# Patient Record
Sex: Male | Born: 1960 | Race: White | Hispanic: No | Smoking: Current every day smoker
Health system: Southern US, Community
[De-identification: ages and names within clinical notes are randomized; demographics above are authoritative.]

## PROBLEM LIST (undated history)

## (undated) DIAGNOSIS — J439 Emphysema, unspecified: Secondary | ICD-10-CM

## (undated) DIAGNOSIS — I2699 Other pulmonary embolism without acute cor pulmonale: Secondary | ICD-10-CM

## (undated) DIAGNOSIS — Z9889 Other specified postprocedural states: Secondary | ICD-10-CM

## (undated) DIAGNOSIS — I639 Cerebral infarction, unspecified: Secondary | ICD-10-CM

## (undated) DIAGNOSIS — G43909 Migraine, unspecified, not intractable, without status migrainosus: Secondary | ICD-10-CM

## (undated) DIAGNOSIS — F419 Anxiety disorder, unspecified: Secondary | ICD-10-CM

## (undated) DIAGNOSIS — E785 Hyperlipidemia, unspecified: Secondary | ICD-10-CM

## (undated) DIAGNOSIS — Z720 Tobacco use: Secondary | ICD-10-CM

## (undated) DIAGNOSIS — I509 Heart failure, unspecified: Secondary | ICD-10-CM

## (undated) DIAGNOSIS — I10 Essential (primary) hypertension: Secondary | ICD-10-CM

## (undated) DIAGNOSIS — F1910 Other psychoactive substance abuse, uncomplicated: Secondary | ICD-10-CM

## (undated) DIAGNOSIS — F1110 Opioid abuse, uncomplicated: Secondary | ICD-10-CM

## (undated) DIAGNOSIS — C801 Malignant (primary) neoplasm, unspecified: Secondary | ICD-10-CM

## (undated) DIAGNOSIS — J45909 Unspecified asthma, uncomplicated: Secondary | ICD-10-CM

## (undated) DIAGNOSIS — J449 Chronic obstructive pulmonary disease, unspecified: Secondary | ICD-10-CM

## (undated) DIAGNOSIS — T7840XA Allergy, unspecified, initial encounter: Secondary | ICD-10-CM

## (undated) DIAGNOSIS — K5903 Drug induced constipation: Secondary | ICD-10-CM

## (undated) DIAGNOSIS — G5702 Lesion of sciatic nerve, left lower limb: Secondary | ICD-10-CM

## (undated) DIAGNOSIS — F32A Depression, unspecified: Secondary | ICD-10-CM

## (undated) HISTORY — DX: Emphysema, unspecified: J43.9

## (undated) HISTORY — DX: Other psychoactive substance abuse, uncomplicated: F19.10

## (undated) HISTORY — DX: Hyperlipidemia, unspecified: E78.5

## (undated) HISTORY — DX: Other specified postprocedural states: Z98.890

## (undated) HISTORY — DX: Malignant (primary) neoplasm, unspecified: C80.1

## (undated) HISTORY — DX: Essential (primary) hypertension: I10

## (undated) HISTORY — DX: Drug induced constipation: K59.03

## (undated) HISTORY — DX: Chronic obstructive pulmonary disease, unspecified: J44.9

## (undated) HISTORY — DX: Migraine, unspecified, not intractable, without status migrainosus: G43.909

## (undated) HISTORY — DX: Heart failure, unspecified: I50.9

## (undated) HISTORY — DX: Depression, unspecified: F32.A

## (undated) HISTORY — DX: Allergy, unspecified, initial encounter: T78.40XA

## (undated) HISTORY — DX: Anxiety disorder, unspecified: F41.9

## (undated) HISTORY — PX: SPINAL FIXATION SURGERY: SHX1055

## (undated) HISTORY — DX: Lesion of sciatic nerve, left lower limb: G57.02

## (undated) HISTORY — DX: Unspecified asthma, uncomplicated: J45.909

## (undated) HISTORY — PX: SPINE SURGERY: SHX786

## (undated) HISTORY — DX: Cerebral infarction, unspecified: I63.9

---

## 2009-12-02 ENCOUNTER — Emergency Department (HOSPITAL_COMMUNITY): Admission: EM | Admit: 2009-12-02 | Discharge: 2009-12-02 | Payer: Self-pay | Admitting: Emergency Medicine

## 2010-01-29 ENCOUNTER — Encounter: Admission: RE | Admit: 2010-01-29 | Discharge: 2010-01-29 | Payer: Self-pay | Admitting: Family Medicine

## 2010-06-20 ENCOUNTER — Encounter: Admission: RE | Admit: 2010-06-20 | Discharge: 2010-06-20 | Payer: Self-pay | Admitting: Internal Medicine

## 2010-09-06 ENCOUNTER — Encounter: Admission: RE | Admit: 2010-09-06 | Discharge: 2010-09-06 | Payer: Self-pay | Admitting: Internal Medicine

## 2010-10-11 ENCOUNTER — Encounter: Admission: RE | Admit: 2010-10-11 | Discharge: 2010-10-11 | Payer: Self-pay | Admitting: *Deleted

## 2010-11-27 ENCOUNTER — Encounter: Payer: Self-pay | Admitting: Internal Medicine

## 2011-01-24 LAB — POCT I-STAT, CHEM 8
BUN: 11 mg/dL (ref 6–23)
Calcium, Ion: 1.08 mmol/L — ABNORMAL LOW (ref 1.12–1.32)
Creatinine, Ser: 0.7 mg/dL (ref 0.4–1.5)
Hemoglobin: 17.3 g/dL — ABNORMAL HIGH (ref 13.0–17.0)
Sodium: 137 mEq/L (ref 135–145)
TCO2: 21 mmol/L (ref 0–100)

## 2011-11-15 ENCOUNTER — Ambulatory Visit (INDEPENDENT_AMBULATORY_CARE_PROVIDER_SITE_OTHER): Payer: Medicaid Other | Admitting: Family Medicine

## 2011-11-15 ENCOUNTER — Encounter: Payer: Self-pay | Admitting: Family Medicine

## 2011-11-15 DIAGNOSIS — F411 Generalized anxiety disorder: Secondary | ICD-10-CM

## 2011-11-15 DIAGNOSIS — E785 Hyperlipidemia, unspecified: Secondary | ICD-10-CM | POA: Insufficient documentation

## 2011-11-15 DIAGNOSIS — K5900 Constipation, unspecified: Secondary | ICD-10-CM

## 2011-11-15 DIAGNOSIS — M25569 Pain in unspecified knee: Secondary | ICD-10-CM

## 2011-11-15 DIAGNOSIS — J4489 Other specified chronic obstructive pulmonary disease: Secondary | ICD-10-CM

## 2011-11-15 DIAGNOSIS — F419 Anxiety disorder, unspecified: Secondary | ICD-10-CM | POA: Insufficient documentation

## 2011-11-15 DIAGNOSIS — I10 Essential (primary) hypertension: Secondary | ICD-10-CM

## 2011-11-15 DIAGNOSIS — G8929 Other chronic pain: Secondary | ICD-10-CM | POA: Insufficient documentation

## 2011-11-15 DIAGNOSIS — F32A Depression, unspecified: Secondary | ICD-10-CM | POA: Insufficient documentation

## 2011-11-15 DIAGNOSIS — M792 Neuralgia and neuritis, unspecified: Secondary | ICD-10-CM | POA: Insufficient documentation

## 2011-11-15 DIAGNOSIS — G971 Other reaction to spinal and lumbar puncture: Secondary | ICD-10-CM

## 2011-11-15 DIAGNOSIS — F329 Major depressive disorder, single episode, unspecified: Secondary | ICD-10-CM

## 2011-11-15 DIAGNOSIS — J309 Allergic rhinitis, unspecified: Secondary | ICD-10-CM

## 2011-11-15 DIAGNOSIS — J449 Chronic obstructive pulmonary disease, unspecified: Secondary | ICD-10-CM | POA: Insufficient documentation

## 2011-11-15 DIAGNOSIS — Z720 Tobacco use: Secondary | ICD-10-CM | POA: Insufficient documentation

## 2011-11-15 DIAGNOSIS — M539 Dorsopathy, unspecified: Secondary | ICD-10-CM

## 2011-11-15 DIAGNOSIS — J302 Other seasonal allergic rhinitis: Secondary | ICD-10-CM | POA: Insufficient documentation

## 2011-11-15 DIAGNOSIS — M4320 Fusion of spine, site unspecified: Secondary | ICD-10-CM | POA: Insufficient documentation

## 2011-11-15 DIAGNOSIS — F17200 Nicotine dependence, unspecified, uncomplicated: Secondary | ICD-10-CM

## 2011-11-15 DIAGNOSIS — F3289 Other specified depressive episodes: Secondary | ICD-10-CM

## 2011-11-15 MED ORDER — ESCITALOPRAM OXALATE 20 MG PO TABS: 20.0000 mg | ORAL_TABLET | Freq: Every day | ORAL | Status: DC

## 2011-11-15 MED ORDER — TRAZODONE HCL 100 MG PO TABS: 100.0000 mg | ORAL_TABLET | Freq: Every day | ORAL | Status: DC

## 2011-11-15 MED ORDER — AMLODIPINE-OLMESARTAN 10-40 MG PO TABS: 1.0000 | ORAL_TABLET | Freq: Every day | ORAL | Status: DC

## 2011-11-15 MED ORDER — ALBUTEROL SULFATE HFA 108 (90 BASE) MCG/ACT IN AERS: 2.0000 | INHALATION_SPRAY | RESPIRATORY_TRACT | Status: DC | PRN

## 2011-11-15 MED ORDER — LORAZEPAM 2 MG PO TABS: 2.0000 mg | ORAL_TABLET | Freq: Two times a day (BID) | ORAL | Status: DC

## 2011-11-15 MED ORDER — POLYETHYLENE GLYCOL 3350 17 G PO PACK: 17.0000 g | PACK | Freq: Every day | ORAL | Status: DC | PRN

## 2011-11-15 MED ORDER — NICOTINE 10 MG IN INHA: 1.0000 | Freq: Four times a day (QID) | RESPIRATORY_TRACT | Status: DC | PRN

## 2011-11-15 MED ORDER — FLUTICASONE PROPIONATE 50 MCG/ACT NA SUSP: 2.0000 | Freq: Two times a day (BID) | NASAL | Status: DC | PRN

## 2011-11-15 MED ORDER — BUTALBITAL-APAP-CAFFEINE 50-750-40 MG PO TABS: 1.0000 | ORAL_TABLET | Freq: Two times a day (BID) | ORAL | Status: DC

## 2011-11-15 MED ORDER — LORATADINE 10 MG PO TABS: 10.0000 mg | ORAL_TABLET | Freq: Every day | ORAL | Status: DC

## 2011-11-15 MED ORDER — DICLOFENAC SODIUM 75 MG PO TBEC: 75.0000 mg | DELAYED_RELEASE_TABLET | Freq: Two times a day (BID) | ORAL | Status: DC

## 2011-11-15 NOTE — Assessment & Plan Note (Signed)
Constipation likely due to pt on daily Opana regimen. Having daily formed BM. Will Dc Amitiza at this time. Will continue w/ Miralax Q Deveny PRN.

## 2011-11-15 NOTE — Patient Instructions (Addendum)
Thank you for coming into clinic today. It  was nice meeting you. As discussed I would like for you to come back in 1 week so that we can recheck your blood pressure and remove the spots on your leg and clean out the spot on your right waist. Im  also very glad that you are interested in stopping smoking. Please look into your Medicaid coverage with regards to meeting with our psychologist regarding depression and our pharmacist to discuss smoking cessation. Please contact the [place] quit line with regards to receiving home nicotine replacement. As discussed continue to try and change your habits as they pertain to your smoking. Please continue on your home medications and meeting with pain management. Have a great Logiudice.

## 2011-11-15 NOTE — Progress Notes (Signed)
  Subjective:    Patient ID: [name] [name], male    DOB: August 25, 1961, 51 y.o.   MRN: [id number]  HPI CC: New Pt, BP, mole irritation, Rt waist skin infection  HTN: Patient currently out of blood pressure medications. Takes a Azoar 10/40 once a Park. Patient reports having always been hypertensive and difficult to control.  Right waist skin infection: Patient reports having staph infection one year ago at right waist which initially presented as a red and hot feeling lesion which became purulent and was drained and cultured. Patient treated with Bactrim for 10 days with resolution of lesion. Patient reports the "spot" has come back over the last 3-4 weeks but does not appear to be infected. Denies fever, rash, pain, discharge  Smoking: Patient reports smoking 2 packs per [place] for greater than 30 years. Patient understands risks of smoking and would like to quit. Patient reports using nicotine replacement methods in the past of patches and gum.  Pain: Patient with chronic pain do to spinal injuries and surgery. Patient followed regularly for pain control at pain clinic.   HA: Pt w/ chronic HA since c-spine surgery. 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   Anxiety: Pt reports long standing h/o anxiety that is well controlled on Lorazapam  COPD: Pt reports using ventolin inhaler QID and is w/o any other inhaler. Pt has never been hospitalized for exacerbation. Pt reports taking flonase daily for COPD.   Constipation: Pt reports takin miralax and Amitiza for constipation. Has regular BM on these medications.   Depression: Well controlled on Escitalopram. Pt would like to come off medication and would like to meet with a counselor.   Onychomycosis: Patient reports being treated in the past for left great toe fungal infection. Oral medications did not eradicate infection. Patient reports regular cleaning and close trimming of nail. Patient reports problems with ingrown toenail of the same affected  toe.  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, spinal stenosis of L4-S1, sciatica, peripheral neuropathy, status post removal of premalignant skin lesion, COPD, constipation, chronic headaches after neck surgery, orbital cellulitis  Family history: Brother: Diabetes, thyroid condition Mother: Total thyroidectomy, depression Sister: Depression, IBS  Social: Tobacco-2 packs per [place] greater than 30 years of smoking Alcohol-not call use for 25 years Illicit drugs-denies   Review of Systems Negative: Chest pain, palpitations, shortness of breath, nausea, vomiting, diarrhea, hematemesis, hematochezia, dysuria, fever, rash, syncope, lightheadedness, change in vision, abdominal pain,   Positive: Headache, peripheral pain, constipation      Objective:   Physical Exam  General: No acute distress, obese HEENT: Tympanic membranes normal bilaterally, oropharynx clear  Cardiovascular: Regular rate and rhythm, no murmurs rubs or gallops Respiratory: Clear to auscultation bilaterally, normal effort Abdominal: Normal active bowel sounds, nonpainful to palpation Skin: Right flank in pouching of the skin with accumulation of matter, erythema, overall skin is dry. Extremities: Left great toenail trimmed back significantly but healthy appearing. Dry blood present in the corners per patient trimmed toenails back deeply. Psych: Normal affect Neuro: Cranial nerves grossly intact      Assessment & Plan:

## 2011-11-15 NOTE — Assessment & Plan Note (Signed)
Pt w/ h/o spinal fusion. Being seen at pain clinic for residual pain. No intervention at this time.

## 2011-11-15 NOTE — Assessment & Plan Note (Signed)
Well controlled per pt. No change in medical regimen. Will address further at future appt.

## 2011-11-15 NOTE — Assessment & Plan Note (Signed)
COPD Not well controlled. No controller medication. No hospitalizations or recent ED visits. Will address further in the future (Pt to f/u in 2 wks for this issue)

## 2011-11-15 NOTE — Assessment & Plan Note (Addendum)
Anxiety well controlled on Lorazepam per pt. No further intervention needed at this time. Will address further in the future.

## 2011-11-15 NOTE — Assessment & Plan Note (Signed)
No current medications for HLD. Will obtain fasting lipid panel at next appt.

## 2011-11-15 NOTE — Assessment & Plan Note (Signed)
Spent > 10 minutes discussing smoking cessation. Pt at significant health risk due to prolonged excessive smoking. Pt very willing to try to quit. Discussed plan of action to break the habit of smoking while replacing nicotine and weaning from nicotine. Pt given literature adn directed to [facility] Quitline. Will provide pt w/ nicotrol inhaler.

## 2011-11-15 NOTE — Assessment & Plan Note (Signed)
BP today 162/96. Pt reports not taking BP meds today. Will refill Azor and will likely start on bblocker such as Carvedilol at next appt. Pt left before getting baseline labs.

## 2011-11-15 NOTE — Assessment & Plan Note (Signed)
Chronic HA, likely secondary to spinal surgery. Will discuss transitioning pt to alternative medication at f/u appt.

## 2011-11-16 ENCOUNTER — Telehealth: Payer: Self-pay | Admitting: *Deleted

## 2011-11-16 ENCOUNTER — Other Ambulatory Visit: Payer: Self-pay | Admitting: Family Medicine

## 2011-11-16 DIAGNOSIS — G971 Other reaction to spinal and lumbar puncture: Secondary | ICD-10-CM

## 2011-11-16 MED ORDER — BUTALBITAL-APAP-CAFFEINE 50-750-40 MG PO TABS: 1.0000 | ORAL_TABLET | Freq: Two times a day (BID) | ORAL | Status: DC

## 2011-11-16 NOTE — Telephone Encounter (Signed)
PA required for Amlodipine- Atorvastatin and Nicotrol inhaler. Form placed in MD box.

## 2011-11-16 NOTE — Telephone Encounter (Signed)
Form for nicotrol faxed to medicaid.

## 2011-11-18 NOTE — Telephone Encounter (Signed)
Dr Konrad Dolores  spoke with medicaid rep about amlodipine/atorvastatin . Today received notice from medicaid that  Nicotrol has been denied. Patient needs to have tried 2 preferred meds.  Will forward to MD.

## 2011-11-21 ENCOUNTER — Ambulatory Visit: Payer: Self-pay | Admitting: Rehabilitative and Restorative Service Providers"

## 2011-11-23 ENCOUNTER — Encounter: Payer: Self-pay | Admitting: Family Medicine

## 2011-11-23 ENCOUNTER — Ambulatory Visit: Payer: Medicaid Other | Attending: Anesthesiology | Admitting: Rehabilitation

## 2011-11-23 DIAGNOSIS — M2560 Stiffness of unspecified joint, not elsewhere classified: Secondary | ICD-10-CM | POA: Insufficient documentation

## 2011-11-23 DIAGNOSIS — R293 Abnormal posture: Secondary | ICD-10-CM | POA: Insufficient documentation

## 2011-11-23 DIAGNOSIS — M2550 Pain in unspecified joint: Secondary | ICD-10-CM | POA: Insufficient documentation

## 2011-11-23 DIAGNOSIS — R262 Difficulty in walking, not elsewhere classified: Secondary | ICD-10-CM | POA: Insufficient documentation

## 2011-11-23 DIAGNOSIS — IMO0001 Reserved for inherently not codable concepts without codable children: Secondary | ICD-10-CM | POA: Insufficient documentation

## 2011-11-23 NOTE — Telephone Encounter (Signed)
Pt has tried multiple nicotine replacement therapies including the patch and gum. Nicotrol inhaler should work well in this pt as it has an inhaled component which may assist w/ pts association of getting nicotine through an inhaled form.

## 2011-11-24 ENCOUNTER — Other Ambulatory Visit: Payer: Self-pay

## 2011-11-24 ENCOUNTER — Ambulatory Visit: Payer: Medicaid Other | Admitting: Family Medicine

## 2011-11-24 NOTE — Telephone Encounter (Signed)
Dr. Konrad Dolores states he will discuss with patient at next office visit.

## 2011-12-01 ENCOUNTER — Encounter: Payer: Self-pay | Admitting: Family Medicine

## 2011-12-01 ENCOUNTER — Ambulatory Visit (INDEPENDENT_AMBULATORY_CARE_PROVIDER_SITE_OTHER): Payer: Medicaid Other | Admitting: Family Medicine

## 2011-12-01 VITALS — BP 129/78 | HR 78 | Temp 98.7°F | Ht 71.0 in | Wt 259.6 lb

## 2011-12-01 DIAGNOSIS — F3289 Other specified depressive episodes: Secondary | ICD-10-CM

## 2011-12-01 DIAGNOSIS — J449 Chronic obstructive pulmonary disease, unspecified: Secondary | ICD-10-CM

## 2011-12-01 DIAGNOSIS — G971 Other reaction to spinal and lumbar puncture: Secondary | ICD-10-CM

## 2011-12-01 DIAGNOSIS — K5900 Constipation, unspecified: Secondary | ICD-10-CM

## 2011-12-01 DIAGNOSIS — F32A Depression, unspecified: Secondary | ICD-10-CM

## 2011-12-01 DIAGNOSIS — F17200 Nicotine dependence, unspecified, uncomplicated: Secondary | ICD-10-CM

## 2011-12-01 DIAGNOSIS — F419 Anxiety disorder, unspecified: Secondary | ICD-10-CM

## 2011-12-01 DIAGNOSIS — E785 Hyperlipidemia, unspecified: Secondary | ICD-10-CM

## 2011-12-01 DIAGNOSIS — Z720 Tobacco use: Secondary | ICD-10-CM

## 2011-12-01 DIAGNOSIS — I10 Essential (primary) hypertension: Secondary | ICD-10-CM

## 2011-12-01 DIAGNOSIS — F329 Major depressive disorder, single episode, unspecified: Secondary | ICD-10-CM

## 2011-12-01 DIAGNOSIS — Z23 Encounter for immunization: Secondary | ICD-10-CM

## 2011-12-01 DIAGNOSIS — F411 Generalized anxiety disorder: Secondary | ICD-10-CM

## 2011-12-01 MED ORDER — TIOTROPIUM BROMIDE MONOHYDRATE 18 MCG IN CAPS: 18.0000 ug | ORAL_CAPSULE | Freq: Every day | RESPIRATORY_TRACT | Status: DC

## 2011-12-01 MED ORDER — LUBIPROSTONE 24 MCG PO CAPS: 24.0000 ug | ORAL_CAPSULE | Freq: Two times a day (BID) | ORAL | Status: AC

## 2011-12-01 NOTE — Assessment & Plan Note (Signed)
Historical diagnosis. Would like for pt to meet w/ Dr. Raymondo Band for smoking cessation and PFT. Will start Spiriva as daily controller medication and advised pt to only use albuterol PRN.

## 2011-12-01 NOTE — Patient Instructions (Addendum)
Thank you for coming in today. We are making good progress with regards to your overall health. As discussed I am starting you on a new medication for your COPD. Please start taking the Spiriva every Kimes and only take the albuterol as needed. I would like for you to meet w/ Dr. Raymondo Band our pharmacist for a pulmonary function test. This will help [place] establish how severe your COPD really is. Please do not take any of your inhalers on the [place] of your appoinmtnemt. Please also discuss your efforts to quit smoking with him. I will continue to try and get your nicotrol inhaler approved. Please also start taking the amitiza again for your constipation.

## 2011-12-02 LAB — LDL CHOLESTEROL, DIRECT: Direct LDL: 134 mg/dL — ABNORMAL HIGH

## 2011-12-02 NOTE — Assessment & Plan Note (Signed)
Much improved. No orthostatic symptoms. Continue current regimen

## 2011-12-02 NOTE — Progress Notes (Signed)
  Subjective:    Patient ID: [name] [name], male    DOB: February 19, 1961, 51 y.o.   MRN: [id number]  HPI CC: HTN, COPD, Tobacco, constipation   HTN: BP significantly improved today in clinic. Pt finally filled Rx and took this am prior to coming into clinic.   COPD: continues to use albuterol qday. Never hospitalized for respiratory exacerbation. Last ED visit for resp complaints >53yrs ago. No controller medication. Continues to smoke 2ppd w/ some cutting back (of 3-4 cigarettes pd). Places head in freezer when gets "Wheezy" w/ relief of symptoms  Tobacco: continues to try to quit. Unable to get the nicotrol inhlr. Has tried welbutrin, chantix, nicotine lozenges/patches/gum in the past. Discussed tobacco use for >6min  Constpation: Miralax not covered by ins. So pt did not fill. Pt on chronic pain meds (Opana) and no BM for 3 days.   HA: photophobia associated w/ HA requiring pt to lie down. Usually relieved w/ Goodies, or advil. Lasts a few hours. Throbbing or dull in nature. Requires Fioricet ~3xwkly w/ resolution of symptoms. Prior workup by spine specialist per pt. Pt states HA from spinal fusion.    Review of Systems Denies any acute SOB, CP, dizzyness, syncope, lightheadedness, n/v/d, hematemasis, hematochezia, hematuria, fever, rash,     Objective:   Physical Exam  Neuro: CN grossly intact, cerebellar function normal, strength 3+ bilat in UE CV: RRR, no m/r/g Lungs: CTAB      Assessment & Plan:     PT NEXT VISIT FOR REMOVAL OF SKIN LESIONS ON LEG AND SIDE

## 2011-12-02 NOTE — Assessment & Plan Note (Signed)
Continues to have HA. No neurological deficits. Continue current regimen. Infrequent Fioricet use

## 2011-12-02 NOTE — Assessment & Plan Note (Signed)
Pt previously on Amitiza 24mg  Qday. Will restart as pt unable to afford Miralax OTC, as not covered by [employer]

## 2011-12-02 NOTE — Assessment & Plan Note (Signed)
Pt trying to cut back. Will continue to work on getting nicotrol inh approved. Will referr to Dr. Raymondo Band for PFT and smoking cessation.

## 2011-12-02 NOTE — Assessment & Plan Note (Signed)
Will address in 2 visits from today

## 2011-12-02 NOTE — Assessment & Plan Note (Signed)
Will assess in 2 visits from today

## 2011-12-06 ENCOUNTER — Ambulatory Visit: Payer: Medicaid Other | Admitting: Rehabilitation

## 2011-12-14 ENCOUNTER — Ambulatory Visit: Payer: Medicaid Other | Attending: Rehabilitation | Admitting: Rehabilitation

## 2011-12-14 DIAGNOSIS — M2550 Pain in unspecified joint: Secondary | ICD-10-CM | POA: Insufficient documentation

## 2011-12-14 DIAGNOSIS — M2560 Stiffness of unspecified joint, not elsewhere classified: Secondary | ICD-10-CM | POA: Insufficient documentation

## 2011-12-14 DIAGNOSIS — IMO0001 Reserved for inherently not codable concepts without codable children: Secondary | ICD-10-CM | POA: Insufficient documentation

## 2011-12-14 DIAGNOSIS — R293 Abnormal posture: Secondary | ICD-10-CM | POA: Insufficient documentation

## 2011-12-14 DIAGNOSIS — R262 Difficulty in walking, not elsewhere classified: Secondary | ICD-10-CM | POA: Insufficient documentation

## 2011-12-21 ENCOUNTER — Ambulatory Visit: Payer: Medicaid Other

## 2011-12-21 ENCOUNTER — Telehealth: Payer: Self-pay | Admitting: Family Medicine

## 2011-12-21 NOTE — Telephone Encounter (Signed)
Returned call to patient.  Informed that Ativan was refilled electronically by Dr. Konrad Dolores on 11/15/11 for #60 tabs.  Patient will check with [facility] pharmacy.  Patient has follow-up appt with Dr. Konrad Dolores on 01/02/12 and can discuss additional refills at that time.  Gaylene Brooks, RN

## 2011-12-21 NOTE — Telephone Encounter (Signed)
Pt is asking about the ativan that was sent in last month.  He was not able to get that and will be running out on 2/23 - pls advise

## 2011-12-23 ENCOUNTER — Telehealth: Payer: Self-pay | Admitting: Family Medicine

## 2011-12-23 NOTE — Telephone Encounter (Signed)
States that [facility] [address] has not rec'd script for his Ativan.  Pt is going thru withdrawal of oxymorphone since he has been out for 2 days - [facility] has the script for this, but they forgot to order it and they will not have it in until Tuesday.  Wants to know what he can do to lessen the withdrawal.

## 2011-12-23 NOTE — Telephone Encounter (Signed)
Spoke with patient . States he did not get an RX for Ativan on 01/08 that  (states it was printed  ) and MD was going to send electronically.  It states in Epic that rx was printed. I called pharmacy and they do not have an RX for Ativan sent on 01/08. However patient did get a reflll of # 60 tabs  on 01/22 from an old prescription from Dr. Almond Lint written 10/05/2011 with 2 refills. . Patient will need new RX by 02/20. Has appointment with PCP on 01/24   Also  see message below , patient has been out of oxymorphone for 2 days and states the withdrawal symptoms are anxiety, feels that nerve endings are on fire, hot flashes, cold sweats. Pain level has increased.  When I called pharmacy was told his  Oxymorphone actually did come in today but Dr. Webb Laws had written on the RX not to fill until 02/16. Will be able to get refill tomorrow.   Paged Dr. Konrad Dolores and he asks for message to be sent to him and he will address later this AM.

## 2011-12-27 ENCOUNTER — Telehealth: Payer: Self-pay | Admitting: Family Medicine

## 2011-12-27 NOTE — Telephone Encounter (Signed)
Spoke to pt by phone on 2/16. Pt has enough medications until scheduled clinic visit. No further action.

## 2012-01-02 ENCOUNTER — Ambulatory Visit (INDEPENDENT_AMBULATORY_CARE_PROVIDER_SITE_OTHER): Payer: Medicaid Other | Admitting: Family Medicine

## 2012-01-02 ENCOUNTER — Encounter: Payer: Self-pay | Admitting: Family Medicine

## 2012-01-02 VITALS — BP 184/108 | HR 61 | Ht 71.0 in | Wt 257.0 lb

## 2012-01-02 DIAGNOSIS — F411 Generalized anxiety disorder: Secondary | ICD-10-CM

## 2012-01-02 DIAGNOSIS — Z720 Tobacco use: Secondary | ICD-10-CM

## 2012-01-02 DIAGNOSIS — F17200 Nicotine dependence, unspecified, uncomplicated: Secondary | ICD-10-CM

## 2012-01-02 DIAGNOSIS — F419 Anxiety disorder, unspecified: Secondary | ICD-10-CM

## 2012-01-02 DIAGNOSIS — E785 Hyperlipidemia, unspecified: Secondary | ICD-10-CM

## 2012-01-02 DIAGNOSIS — L989 Disorder of the skin and subcutaneous tissue, unspecified: Secondary | ICD-10-CM

## 2012-01-02 DIAGNOSIS — J449 Chronic obstructive pulmonary disease, unspecified: Secondary | ICD-10-CM

## 2012-01-02 MED ORDER — LORAZEPAM 2 MG PO TABS: 2.0000 mg | ORAL_TABLET | Freq: Two times a day (BID) | ORAL | Status: DC

## 2012-01-02 MED ORDER — SIMVASTATIN 20 MG PO TABS: 20.0000 mg | ORAL_TABLET | Freq: Every evening | ORAL | Status: DC

## 2012-01-02 NOTE — Patient Instructions (Signed)
Thank you for coming into clinic today. I will let you know the results of the biopsies. As discussed please watch for signs of infection including swelling, redness, puss formation, pain around the site, and fever, Please come back to see me on March 8th for suture removal or sooner if you devolop concerns for infection. Come back to see me at a later time to discuss your anxiety and depression concerns. I will refill your ativan today. Have a great D[place].

## 2012-01-03 DIAGNOSIS — L989 Disorder of the skin and subcutaneous tissue, unspecified: Secondary | ICD-10-CM | POA: Insufficient documentation

## 2012-01-03 NOTE — Assessment & Plan Note (Addendum)
Pt now smoking only 1.5ppd. Attibutes this to his fiance who is working on getting him to quit. Very motivated. To meet with Dr. Raymondo Band. Will continue to work towards gettign nicotrol inh approved

## 2012-01-03 NOTE — Assessment & Plan Note (Signed)
Lesions on leg were excised and sent for biopsy. See procedure note.   Lesion on R flank/back was cleaned w/ Betadine and interrogated. No active sign of infection. Informed pt that the area will slowly fill w/ sluffed skin and other matter/secretions and will need to be cleaned out from time to time. No sign of infection at this time.

## 2012-01-03 NOTE — Assessment & Plan Note (Signed)
Still trying to get in to see Dr. Raymondo Band for PFTs. Needs more funding to pay for copay, per pt.

## 2012-01-03 NOTE — Assessment & Plan Note (Addendum)
Direct LDL elevated. Significant risk factors including smoking hx, HTN. Starting simvastatin. Pt left prior to labs. Will order as future order

## 2012-01-03 NOTE — Progress Notes (Signed)
Procedure Note: Skin lesion excision  Patient given informed consent, signed copy in the chart. Appropriate time out taken. Area prepped and draped in usual sterile fashion. Two lesions were marked and injected w/ a total of 4cc lidocaine w/ epi. A shave biopsy was taken of the thigh lesion w/ no closure required. A scalpel was used to take a fusiform biopsy of the second lesion on the pts R calf. The open incision measured 1cm. 4-0 vycril sutures were used to close the wound.  A small amount of antibiotic ointment was applied and a bandage. Post procedure instructions were given, Patient tolerated the procedure well. There was minimal blood loss. Specimens were sent for pathology analysis.

## 2012-01-03 NOTE — Progress Notes (Signed)
  Subjective:    Patient ID: [name] [name], male    DOB: 1961/10/23, 51 y.o.   MRN: [id number]  HPI CC: Leg lesion Excision, Flank lesion, HLD  SKin lesion biopsy and excision. Pt w/ two lesions on R leg previously mentioned that are irritative, changing in size and color, that the pt wants removed. Lesions have been there for several years w/ recent changes. Pt reports havign previous pre-malignant lesion taken off of face by previous PCP.   Flank lesion: Pt came in with small keratinized piece of matter that he pulled from a small hole in his R flank. No flank pain, swelilng, or erythema. 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  HLD: Direct LDL from previous visit reviewed. Direct LDL as below. Reports being on cholesterol medication in the past which was stopped. Unsure of why it was stopped. No complaints of CP or SOB today  Tobacco abuse and COPD: Decreased to 1.5ppd. Pt states his fiance has been "riding his case." Has not been able to schedule time to meet w/ Dr Raymondo Band for PFTs due to finances. Pt still motivated to try to quit   Review of Systems Negative: fever, n/v/d, constipation, HA, CP, SOB, syncope, lightheadedness, dizziness, abd pain,     Objective:   Physical Exam  CV: RRR Res: CTAB Skin: R back/flank w/ blind sinus w/o erythema, edema, discharge. R leg lesions minimally raised and firm (thigh lesion more pale and dry appearing, calf lesion more red and excoriated).  Direct LDL: 134  BP elevated today (anxious over procedure)      Assessment & Plan:

## 2012-01-04 ENCOUNTER — Telehealth: Payer: Self-pay | Admitting: Family Medicine

## 2012-01-04 NOTE — Telephone Encounter (Signed)
Path report noted. Called pt to follow up from procedure and to provide path results. Did not answer so left a message. Will call back again.

## 2012-01-10 ENCOUNTER — Ambulatory Visit: Payer: Medicaid Other

## 2012-01-11 ENCOUNTER — Ambulatory Visit (INDEPENDENT_AMBULATORY_CARE_PROVIDER_SITE_OTHER): Payer: Medicaid Other | Admitting: *Deleted

## 2012-01-11 DIAGNOSIS — Z4802 Encounter for removal of sutures: Secondary | ICD-10-CM

## 2012-01-11 NOTE — Progress Notes (Signed)
Patient in for suture removal  at site where lesion was removed 9 days ago. One suture removed . No others visible. Patient thinks there was another stitch but he thinks he may have pulled it out when he scratched his leg. The area on posterior  upper thigh is scabbed over but the lesion where suture was placed in approx  size of a nickle and superficial  opening. No drainage or signs of infection. States he scratched scab off last night . Dr. Deirdre Priest looked at area and advises to  apply bacetracin ointment and cover. Patient can apply vaseline to area to keep from scratching. Advsed to clean with soap and water daily. Return if signs of infection.

## 2012-01-19 ENCOUNTER — Telehealth: Payer: Self-pay | Admitting: Family Medicine

## 2012-01-19 NOTE — Telephone Encounter (Signed)
Spoke to pt later in the [place] after initial attempt on 27th w/ path report. Have tried calling several times since then to f/u as pt did not make f/u appt for suture removal. Pt has not called back to date since the 27th.

## 2012-02-08 ENCOUNTER — Ambulatory Visit: Payer: Medicaid Other | Admitting: Family Medicine

## 2012-02-15 ENCOUNTER — Ambulatory Visit (INDEPENDENT_AMBULATORY_CARE_PROVIDER_SITE_OTHER): Payer: Medicaid Other | Admitting: Family Medicine

## 2012-02-15 ENCOUNTER — Encounter: Payer: Self-pay | Admitting: Family Medicine

## 2012-02-15 VITALS — BP 128/75 | HR 80 | Temp 98.6°F | Ht 71.0 in | Wt 256.0 lb

## 2012-02-15 DIAGNOSIS — L989 Disorder of the skin and subcutaneous tissue, unspecified: Secondary | ICD-10-CM

## 2012-02-15 DIAGNOSIS — F3289 Other specified depressive episodes: Secondary | ICD-10-CM

## 2012-02-15 DIAGNOSIS — G8929 Other chronic pain: Secondary | ICD-10-CM

## 2012-02-15 DIAGNOSIS — K5900 Constipation, unspecified: Secondary | ICD-10-CM

## 2012-02-15 DIAGNOSIS — F411 Generalized anxiety disorder: Secondary | ICD-10-CM

## 2012-02-15 DIAGNOSIS — F329 Major depressive disorder, single episode, unspecified: Secondary | ICD-10-CM

## 2012-02-15 DIAGNOSIS — G971 Other reaction to spinal and lumbar puncture: Secondary | ICD-10-CM

## 2012-02-15 DIAGNOSIS — F32A Depression, unspecified: Secondary | ICD-10-CM

## 2012-02-15 DIAGNOSIS — M4320 Fusion of spine, site unspecified: Secondary | ICD-10-CM

## 2012-02-15 DIAGNOSIS — M539 Dorsopathy, unspecified: Secondary | ICD-10-CM

## 2012-02-15 DIAGNOSIS — F419 Anxiety disorder, unspecified: Secondary | ICD-10-CM

## 2012-02-15 DIAGNOSIS — R351 Nocturia: Secondary | ICD-10-CM

## 2012-02-15 MED ORDER — GABAPENTIN 100 MG PO CAPS: 100.0000 mg | ORAL_CAPSULE | Freq: Three times a day (TID) | ORAL | Status: DC

## 2012-02-15 MED ORDER — BUTALBITAL-APAP-CAFFEINE 50-750-40 MG PO TABS: 1.0000 | ORAL_TABLET | Freq: Two times a day (BID) | ORAL | Status: DC

## 2012-02-15 MED ORDER — LORAZEPAM 2 MG PO TABS: 2.0000 mg | ORAL_TABLET | Freq: Two times a day (BID) | ORAL | Status: DC

## 2012-02-15 NOTE — Patient Instructions (Signed)
Thank you for coming into clinic today. Please start taking the Neurontin three times a Jane. This medication may make you drowsy. Please come back to see me in 2 weeks from the time you fill your prescriptions. Have a great [place].

## 2012-02-16 ENCOUNTER — Other Ambulatory Visit: Payer: Self-pay | Admitting: Sports Medicine

## 2012-02-16 DIAGNOSIS — I10 Essential (primary) hypertension: Secondary | ICD-10-CM

## 2012-02-16 MED ORDER — AMLODIPINE-OLMESARTAN 10-40 MG PO TABS: 1.0000 | ORAL_TABLET | Freq: Every day | ORAL | Status: DC
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
## 2012-02-18 DIAGNOSIS — G8929 Other chronic pain: Secondary | ICD-10-CM | POA: Insufficient documentation

## 2012-02-18 MED ORDER — AMITRIPTYLINE HCL 50 MG PO TABS: 25.0000 mg | ORAL_TABLET | Freq: Two times a day (BID) | ORAL | Status: DC

## 2012-02-18 MED ORDER — SENNA 15 MG PO TABS: 15.0000 mg | ORAL_TABLET | Freq: Every day | ORAL | Status: DC

## 2012-02-18 NOTE — Assessment & Plan Note (Signed)
Pt to monitor fluid intake prior to bed time and caffeine use. Concern for BPH. Will further evaluate at next visit w/ additional hx and prostate exam.

## 2012-02-18 NOTE — Assessment & Plan Note (Signed)
Reviewed the path report w/ pt. No irritation at this time. No further intervention at this time

## 2012-02-18 NOTE — Assessment & Plan Note (Signed)
Continue w/ recs from pain mgt. Starting Amitriptyline for depression w/ added chronic pain benefit

## 2012-02-18 NOTE — Assessment & Plan Note (Signed)
Will transition pt off lexapro 20 and start Amitriptyline as pt expressed benefit from this in the past. Likely added benefit from off label chronic pain use. Pt to see me in 2 wks from starting amitriptyline.   Cross taper as follows.   Amitriptyline: 25 BID for 3 days, 50 BID for 3 days, 75 for 3 days, 100 thereafter Lexapro: 15 QHS for 3 days, 10 QHS for 3 days, 5 QHS for 3 days, DC

## 2012-02-18 NOTE — Assessment & Plan Note (Signed)
Constipation likely from chronic opioid use. Amitiza w/ some benefit. Miralax and metamucil w/o benefit. Pt to continue to try and find right amount of Mg sulf. Will start Senna to assist w/ peristalsis.

## 2012-02-22 ENCOUNTER — Telehealth: Payer: Self-pay | Admitting: Family Medicine

## 2012-02-22 NOTE — Telephone Encounter (Signed)
Patient is calling about the Rx for Amitriptyline.  [facility] [address] has not received it, so it needs to be sent again.  He would like for someone to call him when this is done because he is limited for transportation and would like to be able to pick it up today.

## 2012-02-22 NOTE — Telephone Encounter (Signed)
Called in Amitriptyline and Senna to [facility] on [address]. Pharmacy to call pt when filled.

## 2012-03-14 ENCOUNTER — Encounter: Payer: Self-pay | Admitting: Family Medicine

## 2012-03-14 ENCOUNTER — Ambulatory Visit (INDEPENDENT_AMBULATORY_CARE_PROVIDER_SITE_OTHER): Payer: Medicaid Other | Admitting: Family Medicine

## 2012-03-14 VITALS — BP 180/119 | HR 73 | Temp 98.3°F | Ht 71.0 in | Wt 258.0 lb

## 2012-03-14 DIAGNOSIS — F329 Major depressive disorder, single episode, unspecified: Secondary | ICD-10-CM

## 2012-03-14 DIAGNOSIS — F32A Depression, unspecified: Secondary | ICD-10-CM

## 2012-03-14 DIAGNOSIS — N139 Obstructive and reflux uropathy, unspecified: Secondary | ICD-10-CM | POA: Insufficient documentation

## 2012-03-14 DIAGNOSIS — M25569 Pain in unspecified knee: Secondary | ICD-10-CM

## 2012-03-14 DIAGNOSIS — F419 Anxiety disorder, unspecified: Secondary | ICD-10-CM

## 2012-03-14 DIAGNOSIS — G971 Other reaction to spinal and lumbar puncture: Secondary | ICD-10-CM

## 2012-03-14 DIAGNOSIS — G8929 Other chronic pain: Secondary | ICD-10-CM

## 2012-03-14 DIAGNOSIS — F3289 Other specified depressive episodes: Secondary | ICD-10-CM

## 2012-03-14 DIAGNOSIS — M539 Dorsopathy, unspecified: Secondary | ICD-10-CM

## 2012-03-14 DIAGNOSIS — F411 Generalized anxiety disorder: Secondary | ICD-10-CM

## 2012-03-14 DIAGNOSIS — K5900 Constipation, unspecified: Secondary | ICD-10-CM

## 2012-03-14 DIAGNOSIS — M4320 Fusion of spine, site unspecified: Secondary | ICD-10-CM

## 2012-03-14 DIAGNOSIS — R350 Frequency of micturition: Secondary | ICD-10-CM

## 2012-03-14 MED ORDER — GABAPENTIN 300 MG PO CAPS: 300.0000 mg | ORAL_CAPSULE | Freq: Three times a day (TID) | ORAL | Status: DC

## 2012-03-14 MED ORDER — TAMSULOSIN HCL 0.4 MG PO CAPS: 0.4000 mg | ORAL_CAPSULE | Freq: Every day | ORAL | Status: DC

## 2012-03-14 MED ORDER — DICLOFENAC SODIUM 75 MG PO TBEC: 75.0000 mg | DELAYED_RELEASE_TABLET | Freq: Two times a day (BID) | ORAL | Status: DC

## 2012-03-14 MED ORDER — LORAZEPAM 2 MG PO TABS: 2.0000 mg | ORAL_TABLET | Freq: Two times a day (BID) | ORAL | Status: DC

## 2012-03-14 MED ORDER — BUTALBITAL-APAP-CAFFEINE 50-750-40 MG PO TABS: 1.0000 | ORAL_TABLET | Freq: Two times a day (BID) | ORAL | Status: DC

## 2012-03-14 MED ORDER — GABAPENTIN 100 MG PO CAPS: 300.0000 mg | ORAL_CAPSULE | Freq: Three times a day (TID) | ORAL | Status: DC

## 2012-03-14 NOTE — Patient Instructions (Signed)
You are doing great. Keep taking the Amitriptyline as prescribed. Please increase your Neurontin to 300mg  three times a [place]. If this dose makes you sleepy then you can keep your morning and afternoon dose at 100mg . Please start taking your flomax for your nightime urinary symptoms. I will call you with the results of your lab work today. Please schedule a time to have your prostate checked. Have a great weekend.

## 2012-03-15 LAB — PSA: PSA: 0.6 ng/mL (ref <=4.00)

## 2012-03-16 MED ORDER — SENNOSIDES-DOCUSATE SODIUM 8.6-50 MG PO TABS: 1.0000 | ORAL_TABLET | Freq: Every day | ORAL | Status: AC

## 2012-03-16 NOTE — Assessment & Plan Note (Signed)
Improving since starting Amitriptyline and Neurontin. Still will likely benefit from further increase in Neurontin. Will increase to 300mg  TID. Pt instructed in side effects and variations in dosing if needed (aka keeping daytime dose to 100mg ). No change in Amitriptyline.

## 2012-03-16 NOTE — Progress Notes (Deleted)
Patient ID: [name] [name], male   DOB: [birth year], 51 y.o.   MRN: [id number]

## 2012-03-16 NOTE — Assessment & Plan Note (Signed)
Pt encouraged to increase Mg sulfate if has not had BM for several days in order to avoid worsening constipation. Continue Amitiza. Limited in Rx options as Medicaid does not cover other medications. Will try to get combo Senna Docusate approved.

## 2012-03-16 NOTE — Progress Notes (Signed)
  Subjective:    Patient ID: [name] [name], male    DOB: 12-03-1960, 51 y.o.   MRN: [id number]  HPI CC: Nocturia, chronic pain, depression  Nocturia: Patient reports progressively worsening nocturia over several months. Patient reports needing to urinate at night greater than 5-8 times. Patient reports feeling urge to urinate and sitting on toilet urinating, and waiting a few minutes and then urinating again, waiting a few minutes and then urinating again before feeling like bladder is empty. Patient reports some similar episodes during the daytime. Denies hematuria, weight loss, night sweats, burning on urination. Denies previous prostate exam or PSA testing  Chronic pain: Significantly improved since starting amitriptyline and Neurontin. Continues to go to pain clinic. Denies excessive sleepiness from Neurontin. Patient would like to try a higher dose of Neurontin.  Depression: Reports improved mood since changing from Lexapro to Elavil. Currently taking 100 mg twice a [place] of amitriptyline. Denies chest pain, palpitations, lightheadedness, syncope. Patient not interested in seeking further evaluation at this time.  Constipation: Worsening constipation as pain medication regimen has increased. Patient has tried multiple various therapies in the past without much relief. Bowel movements only occur every few days and are hard and painful to pass. Some blood noted when wiping. Denies significant hematochezia. Patient has never had a colonoscopy. The family history of colon cancer. Insurance does not cover some or MiraLax which has been previously prescribed. Patient has had various successes with these medications and other OTC constipation medications. Pt only purchases OTC constipation medications on rare occasion as pt w/ few funds and needs insurance to pay for Rx. Amitiza has worked best in the past and is covered by [employer]   Review of Systems See HPI    Objective:   Physical Exam  CV: RRR, no  m/r/g Resp: CTAB, normal effort  Pt deferred rectal/prostate exam.       Assessment & Plan:

## 2012-03-16 NOTE — Assessment & Plan Note (Signed)
Improved after DC Elavil and starting Amitriptyline. Tolerated wean and transition very well. No desire from pt to seek further attention outside of clinic. Continue current therapy.

## 2012-03-16 NOTE — Assessment & Plan Note (Signed)
BPH vs Prostatic malignancy vs urinary tumor. PSA today. Pt not willing to have prostate exam today. Flomax for relief. Likely to provide additional benefit to pt BP as hypertensive.

## 2012-03-29 ENCOUNTER — Telehealth: Payer: Self-pay | Admitting: Family Medicine

## 2012-03-29 NOTE — Telephone Encounter (Signed)
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 pls advise

## 2012-03-29 NOTE — Telephone Encounter (Signed)
Received message from staff that pt would like to use Bisacodyl for constipation as this has worked for him. Informed pt of risks of longterm therapy including hypokalemia and bowel irritation. Advised to use PRN. If using daily recommend having K checked. Pt will purchase OTC.  Added to med list

## 2012-04-04 ENCOUNTER — Other Ambulatory Visit: Payer: Self-pay | Admitting: Anesthesiology

## 2012-04-04 DIAGNOSIS — M60009 Infective myositis, unspecified site: Secondary | ICD-10-CM

## 2012-04-04 DIAGNOSIS — M5137 Other intervertebral disc degeneration, lumbosacral region: Secondary | ICD-10-CM

## 2012-04-04 DIAGNOSIS — M5030 Other cervical disc degeneration, unspecified cervical region: Secondary | ICD-10-CM

## 2012-04-04 DIAGNOSIS — R209 Unspecified disturbances of skin sensation: Secondary | ICD-10-CM

## 2012-04-04 DIAGNOSIS — M5412 Radiculopathy, cervical region: Secondary | ICD-10-CM

## 2012-04-04 DIAGNOSIS — S335XXA Sprain of ligaments of lumbar spine, initial encounter: Secondary | ICD-10-CM

## 2012-04-04 DIAGNOSIS — M545 Low back pain, unspecified: Secondary | ICD-10-CM

## 2012-04-04 DIAGNOSIS — M533 Sacrococcygeal disorders, not elsewhere classified: Secondary | ICD-10-CM

## 2012-04-04 DIAGNOSIS — M51379 Other intervertebral disc degeneration, lumbosacral region without mention of lumbar back pain or lower extremity pain: Secondary | ICD-10-CM

## 2012-04-04 DIAGNOSIS — G608 Other hereditary and idiopathic neuropathies: Secondary | ICD-10-CM

## 2012-04-04 DIAGNOSIS — S139XXA Sprain of joints and ligaments of unspecified parts of neck, initial encounter: Secondary | ICD-10-CM

## 2012-04-04 DIAGNOSIS — M5430 Sciatica, unspecified side: Secondary | ICD-10-CM

## 2012-04-04 DIAGNOSIS — M542 Cervicalgia: Secondary | ICD-10-CM

## 2012-04-10 ENCOUNTER — Other Ambulatory Visit (HOSPITAL_COMMUNITY): Payer: Medicaid Other

## 2012-04-10 ENCOUNTER — Ambulatory Visit (HOSPITAL_COMMUNITY)
Admission: RE | Admit: 2012-04-10 | Discharge: 2012-04-10 | Disposition: A | Discharge status: Home / Self Care | Payer: Medicaid Other | Source: Ambulatory Visit | Attending: Anesthesiology | Admitting: Anesthesiology

## 2012-04-10 DIAGNOSIS — S139XXA Sprain of joints and ligaments of unspecified parts of neck, initial encounter: Secondary | ICD-10-CM

## 2012-04-10 DIAGNOSIS — G608 Other hereditary and idiopathic neuropathies: Secondary | ICD-10-CM

## 2012-04-10 DIAGNOSIS — R209 Unspecified disturbances of skin sensation: Secondary | ICD-10-CM

## 2012-04-10 DIAGNOSIS — S335XXA Sprain of ligaments of lumbar spine, initial encounter: Secondary | ICD-10-CM

## 2012-04-10 DIAGNOSIS — M5030 Other cervical disc degeneration, unspecified cervical region: Secondary | ICD-10-CM

## 2012-04-10 DIAGNOSIS — M545 Low back pain, unspecified: Secondary | ICD-10-CM

## 2012-04-10 DIAGNOSIS — M5430 Sciatica, unspecified side: Secondary | ICD-10-CM

## 2012-04-10 DIAGNOSIS — M542 Cervicalgia: Secondary | ICD-10-CM | POA: Insufficient documentation

## 2012-04-10 DIAGNOSIS — M60009 Infective myositis, unspecified site: Secondary | ICD-10-CM

## 2012-04-10 DIAGNOSIS — M5412 Radiculopathy, cervical region: Secondary | ICD-10-CM

## 2012-04-10 DIAGNOSIS — M533 Sacrococcygeal disorders, not elsewhere classified: Secondary | ICD-10-CM

## 2012-04-10 DIAGNOSIS — M5137 Other intervertebral disc degeneration, lumbosacral region: Secondary | ICD-10-CM

## 2012-04-16 ENCOUNTER — Other Ambulatory Visit: Payer: Self-pay | Admitting: *Deleted

## 2012-04-16 DIAGNOSIS — F329 Major depressive disorder, single episode, unspecified: Secondary | ICD-10-CM

## 2012-04-16 DIAGNOSIS — F32A Depression, unspecified: Secondary | ICD-10-CM

## 2012-04-17 MED ORDER — AMITRIPTYLINE HCL 50 MG PO TABS: 25.0000 mg | ORAL_TABLET | Freq: Two times a day (BID) | ORAL | Status: DC

## 2012-04-22 ENCOUNTER — Other Ambulatory Visit: Payer: Medicaid Other

## 2012-04-26 ENCOUNTER — Ambulatory Visit: Admission: RE | Admit: 2012-04-26 | Payer: Medicaid Other | Source: Ambulatory Visit

## 2012-04-26 ENCOUNTER — Other Ambulatory Visit: Payer: Self-pay | Admitting: Anesthesiology

## 2012-04-26 ENCOUNTER — Ambulatory Visit
Admission: RE | Admit: 2012-04-26 | Discharge: 2012-04-26 | Disposition: A | Discharge status: Home / Self Care | Payer: Medicaid Other | Source: Ambulatory Visit | Attending: Anesthesiology | Admitting: Anesthesiology

## 2012-04-26 ENCOUNTER — Telehealth: Payer: Self-pay | Admitting: Family Medicine

## 2012-04-26 DIAGNOSIS — S139XXA Sprain of joints and ligaments of unspecified parts of neck, initial encounter: Secondary | ICD-10-CM

## 2012-04-26 DIAGNOSIS — M542 Cervicalgia: Secondary | ICD-10-CM

## 2012-04-26 MED ORDER — GADOBENATE DIMEGLUMINE 529 MG/ML IV SOLN
20.0000 mL | Freq: Once | INTRAVENOUS | Status: AC | PRN
  Administered 2012-04-26: 20 mL via INTRAVENOUS

## 2012-04-26 NOTE — Telephone Encounter (Signed)
Patient notified medication has been called in to [facility].  Ileana Ladd

## 2012-04-26 NOTE — Telephone Encounter (Signed)
Will forward to MD because per note pt has been on amitriptyline for some time, but the sig of the med rxd on 04/16/12 makes it seem as if the med is being started.  Will have MD followup with me and I will be happy to call it in since it seems the rx was printed and not electronically faxed. Maysen Sudol, Maryjo [place]

## 2012-04-26 NOTE — Telephone Encounter (Signed)
Elavil 50 mg take two tablets twice daily #120 with no refills called to [facility] [phone].  Ileana Ladd

## 2012-04-26 NOTE — Telephone Encounter (Signed)
Pt checking status of rx for amitriptyline, says the pharmacy never received it, according to our records it looks like it was sent to [facility] on 6/10, told pt RN would call pharmacy and let him know. Pt says if we dont reach him we can speak with his Fiance Phoebe Sharps.

## 2012-05-15 ENCOUNTER — Ambulatory Visit: Payer: Medicaid Other | Admitting: Family Medicine

## 2012-05-17 ENCOUNTER — Other Ambulatory Visit: Payer: Self-pay | Admitting: *Deleted

## 2012-05-17 DIAGNOSIS — M4320 Fusion of spine, site unspecified: Secondary | ICD-10-CM

## 2012-05-17 MED ORDER — GABAPENTIN 300 MG PO CAPS: 300.0000 mg | ORAL_CAPSULE | Freq: Three times a day (TID) | ORAL | Status: DC

## 2012-05-25 ENCOUNTER — Ambulatory Visit (INDEPENDENT_AMBULATORY_CARE_PROVIDER_SITE_OTHER): Payer: Medicaid Other | Admitting: Family Medicine

## 2012-05-25 ENCOUNTER — Encounter: Payer: Self-pay | Admitting: Family Medicine

## 2012-05-25 VITALS — BP 146/84 | HR 79 | Ht 71.0 in | Wt 264.0 lb

## 2012-05-25 DIAGNOSIS — F419 Anxiety disorder, unspecified: Secondary | ICD-10-CM

## 2012-05-25 DIAGNOSIS — F411 Generalized anxiety disorder: Secondary | ICD-10-CM

## 2012-05-25 DIAGNOSIS — J309 Allergic rhinitis, unspecified: Secondary | ICD-10-CM

## 2012-05-25 DIAGNOSIS — F3289 Other specified depressive episodes: Secondary | ICD-10-CM

## 2012-05-25 DIAGNOSIS — G971 Other reaction to spinal and lumbar puncture: Secondary | ICD-10-CM

## 2012-05-25 DIAGNOSIS — J302 Other seasonal allergic rhinitis: Secondary | ICD-10-CM

## 2012-05-25 DIAGNOSIS — F329 Major depressive disorder, single episode, unspecified: Secondary | ICD-10-CM

## 2012-05-25 DIAGNOSIS — M4320 Fusion of spine, site unspecified: Secondary | ICD-10-CM

## 2012-05-25 DIAGNOSIS — G8929 Other chronic pain: Secondary | ICD-10-CM

## 2012-05-25 DIAGNOSIS — M539 Dorsopathy, unspecified: Secondary | ICD-10-CM

## 2012-05-25 DIAGNOSIS — N139 Obstructive and reflux uropathy, unspecified: Secondary | ICD-10-CM

## 2012-05-25 DIAGNOSIS — J449 Chronic obstructive pulmonary disease, unspecified: Secondary | ICD-10-CM

## 2012-05-25 DIAGNOSIS — F32A Depression, unspecified: Secondary | ICD-10-CM

## 2012-05-25 DIAGNOSIS — R350 Frequency of micturition: Secondary | ICD-10-CM

## 2012-05-25 MED ORDER — FLUTICASONE PROPIONATE 50 MCG/ACT NA SUSP: 2.0000 | Freq: Two times a day (BID) | NASAL | Status: DC | PRN

## 2012-05-25 MED ORDER — BUTALBITAL-APAP-CAFFEINE 50-750-40 MG PO TABS: 1.0000 | ORAL_TABLET | Freq: Two times a day (BID) | ORAL | Status: DC

## 2012-05-25 MED ORDER — GABAPENTIN 300 MG PO CAPS: 600.0000 mg | ORAL_CAPSULE | Freq: Three times a day (TID) | ORAL | Status: DC

## 2012-05-25 MED ORDER — ALBUTEROL SULFATE HFA 108 (90 BASE) MCG/ACT IN AERS: 2.0000 | INHALATION_SPRAY | RESPIRATORY_TRACT | Status: DC | PRN

## 2012-05-25 MED ORDER — TAMSULOSIN HCL 0.4 MG PO CAPS: 0.8000 mg | ORAL_CAPSULE | Freq: Every day | ORAL | Status: DC

## 2012-05-25 MED ORDER — LORAZEPAM 2 MG PO TABS: 2.0000 mg | ORAL_TABLET | Freq: Two times a day (BID) | ORAL | Status: DC

## 2012-05-25 MED ORDER — TRAZODONE HCL 100 MG PO TABS: 100.0000 mg | ORAL_TABLET | Freq: Every day | ORAL | Status: DC

## 2012-05-25 MED ORDER — AMITRIPTYLINE HCL 50 MG PO TABS: 25.0000 mg | ORAL_TABLET | Freq: Two times a day (BID) | ORAL | Status: DC

## 2012-05-25 NOTE — Patient Instructions (Signed)
Thank you for coming in today.  Please let me know if you have any problems with getting your prescriptions filled or with meeting with the orthopedic surgeon. Please increase your dose of Flomax to 10mg  per [place]. Please come back to see me at your earliest convienence for your skin complaints.  Have a great weekend.

## 2012-05-28 ENCOUNTER — Telehealth: Payer: Self-pay | Admitting: *Deleted

## 2012-05-28 MED ORDER — BUTALBITAL-APAP-CAFFEINE 50-500-40 MG PO TABS: 1.0000 | ORAL_TABLET | ORAL | Status: AC | PRN

## 2012-05-28 MED ORDER — BUTALBITAL-APAP-CAFFEINE 50-500-40 MG PO CAPS: 1.0000 | ORAL_CAPSULE | Freq: Two times a day (BID) | ORAL | Status: DC

## 2012-05-29 NOTE — Assessment & Plan Note (Signed)
Patient to continue interventions per pain clinic recommendations. Will increase gabapentin.

## 2012-05-29 NOTE — Assessment & Plan Note (Signed)
Likely BPH compounded by anticholinergic effects of amitriptyline. Increase Flomax to 0.8 mg daily. Will consider adding bethanechol in the future. Patient still needs prostate exam.

## 2012-05-29 NOTE — Progress Notes (Signed)
  Subjective:    Patient ID: [name] [name], male    DOB: [birth year], 51 y.o.   MRN: [id number]  HPI  Chief complaint: Constipation, urinary difficulties, chronic pain  Constipation: Chronic problem for patient since starting opioids. Patient has tried numerous therapies in the past including milk of magnesia, MiraLax, senna, anesthesia, increased fiber intake, Colace, enema. Patient finds that single therapy seems to work for a period of time but then becomes ineffective. Patient started on bisacodyl last time with benefit initially but stopped working over the last week or 2. Patient has found great benefit with milk of magnesia but has difficulty titrating proper dose and typically ends up with diarrhea. Patient can go up to one week without bowel movement at which time bowel movements are hard and painful to pass. Some days with small amount of very blood after passing hard bowel movement. Denies current abdominal pain, weight loss, fever, nausea, vomiting  Difficulty urinating: Patient started on Flomax at last appointment. Patient reports little to no benefit. Patient still deferring prostate exam at this time. Previous laboratory results or PSA were reviewed with patient and noted to be normal. Symptoms are identical to those noted in previous note. Endorses frequent small-volume urination sensation of having remaining urinary bladder and unable to empty. States that this is sometimes worse when severely constipated. Denies hematuria,.  Chronic pain: Patient continues to have chronic pain addressed by pain clinic. Patient reports significant improvement in overall pain control with increased Neurontin dosing. Patient also reports sensation of RLS with Neurontin. Patient denies any loss of motor bowel or bladder function, excessive daytime sleepiness.  Review of Systems  per history of present illness     Objective:   Physical Exam        Assessment & Plan:

## 2012-05-29 NOTE — Assessment & Plan Note (Signed)
This will likely be a persistent problem for patient until he is off of opioid medications. Patient to cycle through various anti-constipation medications as listed in history of present illness and in problem overview.

## 2012-05-30 NOTE — Telephone Encounter (Signed)
Pharmacy called stating Butalbital APAP, caffeine does not come in dose Dr. Konrad Dolores prescribed. Paged Dr. Konrad Dolores on 07/22 and he advises OK to give the dosage that is available 50/500/40. Dr. Gwendolyn Grant sent in  [facility].

## 2012-05-31 ENCOUNTER — Telehealth: Payer: Self-pay | Admitting: Family Medicine

## 2012-05-31 DIAGNOSIS — N139 Obstructive and reflux uropathy, unspecified: Secondary | ICD-10-CM

## 2012-05-31 NOTE — Telephone Encounter (Signed)
[name] have concerns about taking the Flomax and Mineral at the same time.  Believe by taking the mineral along with the other med it is preventing the Flomax from being absorbed in the stomach.  Need to have a different time for taking the either and not together.  Please call back to advise.  If there is no answer, can discuss with his fiancee, [name] [name] or lv msg on machine.

## 2012-06-01 NOTE — Assessment & Plan Note (Signed)
Improved urination w/ flomax 0.8mg 

## 2012-06-01 NOTE — Telephone Encounter (Signed)
Pt called and informed that peak levels of flomax are achieved in 4-5hrs w/o food. If taken w/ food, peak is achieved in 6-7hrs. Pt to take mineral oil in the am and flomax in the pm.   Reports increased flomax dose working very well.

## 2012-06-16 ENCOUNTER — Telehealth: Payer: Self-pay | Admitting: Family Medicine

## 2012-06-16 NOTE — Telephone Encounter (Signed)
EMERGENCY LINE CALL: Pt reports that PCP has ordered Rx for medication for tension headache.  Pharmacy filled it 1 month ago, but cannot fill it this month because of dose change from manufacturer.  Informed pt that we do not refill medications over the emergency line.  Pt will call on Monday to discuss with Dr. Konrad Dolores.  Advised pt he could try Excedrine migraine for this tension headache.  Advised against Goody's and BD powders.

## 2012-06-18 ENCOUNTER — Encounter: Payer: Self-pay | Admitting: Family Medicine

## 2012-06-18 ENCOUNTER — Ambulatory Visit (INDEPENDENT_AMBULATORY_CARE_PROVIDER_SITE_OTHER): Payer: Medicaid Other | Admitting: Family Medicine

## 2012-06-18 VITALS — BP 145/73 | HR 78 | Temp 98.4°F | Ht 71.0 in | Wt 265.0 lb

## 2012-06-18 DIAGNOSIS — L989 Disorder of the skin and subcutaneous tissue, unspecified: Secondary | ICD-10-CM

## 2012-06-18 DIAGNOSIS — R600 Localized edema: Secondary | ICD-10-CM | POA: Insufficient documentation

## 2012-06-18 DIAGNOSIS — G971 Other reaction to spinal and lumbar puncture: Secondary | ICD-10-CM

## 2012-06-18 DIAGNOSIS — R21 Rash and other nonspecific skin eruption: Secondary | ICD-10-CM

## 2012-06-18 DIAGNOSIS — R6882 Decreased libido: Secondary | ICD-10-CM

## 2012-06-18 DIAGNOSIS — R609 Edema, unspecified: Secondary | ICD-10-CM

## 2012-06-18 DIAGNOSIS — I10 Essential (primary) hypertension: Secondary | ICD-10-CM

## 2012-06-18 MED ORDER — BUTALBITAL-APAP-CAFFEINE 50-325-40 MG PO TABS: 1.0000 | ORAL_TABLET | Freq: Two times a day (BID) | ORAL | Status: DC | PRN

## 2012-06-18 MED ORDER — OLMESARTAN MEDOXOMIL 40 MG PO TABS: 40.0000 mg | ORAL_TABLET | Freq: Every day | ORAL | Status: DC

## 2012-06-18 MED ORDER — HYDROCHLOROTHIAZIDE 25 MG PO TABS: 12.5000 mg | ORAL_TABLET | Freq: Every day | ORAL | Status: DC

## 2012-06-18 NOTE — Patient Instructions (Addendum)
Thank you for coming in today I believe that your rash on your leg is from lower leg swelling and pooling of blood in the veins Please stop taking Azor. Please start Losartan and HCTZ. Please come in sometime next week for a blood pressure check with the nurse Please go to the lab for blood work. I will let you know if anything is abnormal Have a great [place]

## 2012-06-18 NOTE — Assessment & Plan Note (Addendum)
DC AMlodipine due to LE edema.  Olmesartan not covered will change to Lisinopril Start HCTZ for BP control and for LE edema

## 2012-06-19 ENCOUNTER — Telehealth: Payer: Self-pay | Admitting: Family Medicine

## 2012-06-19 ENCOUNTER — Other Ambulatory Visit: Payer: Medicaid Other

## 2012-06-19 DIAGNOSIS — R600 Localized edema: Secondary | ICD-10-CM

## 2012-06-19 DIAGNOSIS — E785 Hyperlipidemia, unspecified: Secondary | ICD-10-CM

## 2012-06-19 DIAGNOSIS — R6882 Decreased libido: Secondary | ICD-10-CM

## 2012-06-19 LAB — COMPREHENSIVE METABOLIC PANEL
ALT: 15 U/L (ref 0–53)
Albumin: 4.1 g/dL (ref 3.5–5.2)
Alkaline Phosphatase: 94 U/L (ref 39–117)
CO2: 26 mEq/L (ref 19–32)
Glucose, Bld: 80 mg/dL (ref 70–99)
Potassium: 4.5 mEq/L (ref 3.5–5.3)
Sodium: 142 mEq/L (ref 135–145)
Total Protein: 6.4 g/dL (ref 6.0–8.3)

## 2012-06-19 LAB — CBC
Hemoglobin: 14.9 g/dL (ref 13.0–17.0)
Platelets: 263 10*3/uL (ref 150–400)
RBC: 4.6 MIL/uL (ref 4.22–5.81)

## 2012-06-19 LAB — TESTOSTERONE: Testosterone: 173.86 ng/dL — ABNORMAL LOW (ref 300–890)

## 2012-06-19 LAB — TSH: TSH: 2.285 u[IU]/mL (ref 0.350–4.500)

## 2012-06-19 NOTE — Progress Notes (Signed)
CMP,CBC,TSH AND TESTOSTERONE DONE TODAY [name] [name]

## 2012-06-19 NOTE — Telephone Encounter (Signed)
Patient is calling because his pharmacy sent a fax stating that Medicaid requires PA on Benicar and he would like to know the status of this.

## 2012-06-19 NOTE — Telephone Encounter (Signed)
Form to complete for PA placed on MD desk.( He is in clinic today.)

## 2012-06-20 ENCOUNTER — Telehealth: Payer: Self-pay | Admitting: Family Medicine

## 2012-06-20 DIAGNOSIS — R21 Rash and other nonspecific skin eruption: Secondary | ICD-10-CM | POA: Insufficient documentation

## 2012-06-20 MED ORDER — BUTALBITAL-APAP-CAFFEINE 50-325-40 MG PO TABS: 1.0000 | ORAL_TABLET | Freq: Two times a day (BID) | ORAL | Status: DC | PRN

## 2012-06-20 MED ORDER — LISINOPRIL 20 MG PO TABS: 20.0000 mg | ORAL_TABLET | Freq: Every day | ORAL | Status: DC

## 2012-06-20 NOTE — Assessment & Plan Note (Signed)
Discussed likely organic causes. Pt desiring testosterone testing and treatment. Discussed at length the risks and benefits and lack of evidence. Will proceed w/ am Testosterone level testing.

## 2012-06-20 NOTE — Assessment & Plan Note (Signed)
Likely venous stasis. DC Amlodipine and start HCTZ

## 2012-06-20 NOTE — Assessment & Plan Note (Signed)
Continue w/ Mineral oil dosing prn. Pt aware of dangers of chronic use

## 2012-06-20 NOTE — Progress Notes (Signed)
  Subjective:    Patient ID: [name] [name], male    DOB: [birth year], 51 y.o.   MRN: [id number]  HPI CC: decreased libido, Rash, constipation, HTN  Rash: Started on L leg 5-68mo ago. Started on R leg 1-23mo ago. Non painful. Non puritic. No aggrevating or aleviating factors. No h/o rashes. Denies fever, insect bites, n/v/d. Malaise.   Decreased libido: difficulty becoming aroused for last 6-75mo. Uninterested in sex. Lives w/ long time fiance who pt loves dearly. Able to have and maintain erection from time to time. No change in overall health, stress over the past 6-7 months. Does not exercise, is overweight, smokes. Denies CP, SOB, syncope, fatigue  Constipation: Significantly improved w/ daily soft BM after once wkly dosing w/ mineral oil (approximately 1-2Tbspoons) Denies diarrhea, hematochezia. Continues to take considerable narcotics due to pain  Reviewed PMH, medications, and social hx  Review of Systems Per HPI     Objective:   Physical Exam Gen: Obese, no distress Ext: LE 1+ pitting edema Musc: Multiple joints w/ decreased mobility due osteoarthritis and surgery Skin: very small and mild petechial rash in LE bilat that is mildly blanching w/ areas of skin darkening. Most pronounced on the medial surface       Assessment & Plan:

## 2012-06-20 NOTE — Assessment & Plan Note (Signed)
Likely from venous stasis. Predominance over the great saphinus vein distribution. Amlodipine DC and Start HCTZ. Will monitor

## 2012-06-20 NOTE — Telephone Encounter (Signed)
Since Benicar required PA MD will change RX to lisinopril instead. Sent electronically. Also resent RX for Fioricet with # 60 tabs. Patient notified of this and pharmacy advised to cancel RX for Benicar . They have received new RX.

## 2012-06-20 NOTE — Assessment & Plan Note (Signed)
R calf skin lesion returned. 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 Pt not desiring treatment at this time

## 2012-06-20 NOTE — Telephone Encounter (Signed)
Pt needs to talk to nurse about his BP meds.  Not sure what he should start today.  Wants to know about when PA will be approved.  Also, Pt was given fiorcet #14 - usually get #60 - wants to know why he only got a few  [facility] [address]

## 2012-07-24 ENCOUNTER — Telehealth: Payer: Self-pay | Admitting: Family Medicine

## 2012-07-24 DIAGNOSIS — G971 Other reaction to spinal and lumbar puncture: Secondary | ICD-10-CM

## 2012-07-24 MED ORDER — BUTALBITAL-APAP-CAFFEINE 50-325-40 MG PO TABS: 1.0000 | ORAL_TABLET | Freq: Two times a day (BID) | ORAL | Status: DC | PRN

## 2012-07-24 NOTE — Telephone Encounter (Signed)
Med refill done sent to [facility] [address].[name] [name] [place]

## 2012-07-24 NOTE — Telephone Encounter (Signed)
[name] [name] calling to inquire about request for butabital-acetaminophen 50-325- 40 mg tabs that pharmacy faxed to [place] last week.  Haven't heard response back from provider and Mr. Sun need to know when refill will be sent.  Please inform patient when this is done.

## 2012-07-25 ENCOUNTER — Telehealth: Payer: Self-pay | Admitting: Family Medicine

## 2012-07-25 DIAGNOSIS — R6882 Decreased libido: Secondary | ICD-10-CM

## 2012-07-25 NOTE — Assessment & Plan Note (Signed)
Low testosterone noted Will need f/u am testosterone level and SBHG Pt to call and make lab appt and f/u clinic appt for prostate exam and discussion of starting therapy

## 2012-07-25 NOTE — Telephone Encounter (Signed)
Called pt to inform of results. Will return for further labs 07/26/12 and for f/u appt

## 2012-07-26 ENCOUNTER — Other Ambulatory Visit: Payer: Medicaid Other

## 2012-07-26 DIAGNOSIS — R6882 Decreased libido: Secondary | ICD-10-CM

## 2012-07-26 LAB — TESTOSTERONE: Testosterone: 144.81 ng/dL — ABNORMAL LOW (ref 300–890)

## 2012-07-26 NOTE — Progress Notes (Signed)
LABS DONE TODAY [name] [name] 

## 2012-07-27 LAB — SEX HORMONE BINDING GLOBULIN: Sex Hormone Binding: 25 nmol/L (ref 13–71)

## 2012-07-28 ENCOUNTER — Telehealth: Payer: Self-pay | Admitting: Family Medicine

## 2012-07-28 DIAGNOSIS — R6882 Decreased libido: Secondary | ICD-10-CM

## 2012-07-28 NOTE — Assessment & Plan Note (Signed)
Pt to come in for further discussion of starting testosterone replacement and prostate exam

## 2012-07-31 NOTE — Telephone Encounter (Signed)
Patient is calling for his lab results. °

## 2012-08-01 NOTE — Telephone Encounter (Signed)
Called and spoke to pt regarding lab results. Pt to see me in clinic on Monday

## 2012-08-06 ENCOUNTER — Ambulatory Visit (INDEPENDENT_AMBULATORY_CARE_PROVIDER_SITE_OTHER): Payer: Medicaid Other | Admitting: Family Medicine

## 2012-08-06 VITALS — BP 151/80 | HR 73 | Temp 97.9°F | Ht 71.0 in | Wt 270.0 lb

## 2012-08-06 DIAGNOSIS — F3289 Other specified depressive episodes: Secondary | ICD-10-CM

## 2012-08-06 DIAGNOSIS — F411 Generalized anxiety disorder: Secondary | ICD-10-CM

## 2012-08-06 DIAGNOSIS — J449 Chronic obstructive pulmonary disease, unspecified: Secondary | ICD-10-CM

## 2012-08-06 DIAGNOSIS — I10 Essential (primary) hypertension: Secondary | ICD-10-CM

## 2012-08-06 DIAGNOSIS — M539 Dorsopathy, unspecified: Secondary | ICD-10-CM

## 2012-08-06 DIAGNOSIS — E785 Hyperlipidemia, unspecified: Secondary | ICD-10-CM

## 2012-08-06 DIAGNOSIS — N139 Obstructive and reflux uropathy, unspecified: Secondary | ICD-10-CM

## 2012-08-06 DIAGNOSIS — R6882 Decreased libido: Secondary | ICD-10-CM

## 2012-08-06 DIAGNOSIS — F329 Major depressive disorder, single episode, unspecified: Secondary | ICD-10-CM

## 2012-08-06 DIAGNOSIS — M4320 Fusion of spine, site unspecified: Secondary | ICD-10-CM

## 2012-08-06 DIAGNOSIS — G971 Other reaction to spinal and lumbar puncture: Secondary | ICD-10-CM

## 2012-08-06 DIAGNOSIS — F32A Depression, unspecified: Secondary | ICD-10-CM

## 2012-08-06 DIAGNOSIS — F419 Anxiety disorder, unspecified: Secondary | ICD-10-CM

## 2012-08-06 DIAGNOSIS — G8929 Other chronic pain: Secondary | ICD-10-CM

## 2012-08-06 DIAGNOSIS — M25569 Pain in unspecified knee: Secondary | ICD-10-CM

## 2012-08-06 MED ORDER — TRAZODONE HCL 100 MG PO TABS: 100.0000 mg | ORAL_TABLET | Freq: Every day | ORAL | Status: DC

## 2012-08-06 MED ORDER — CYCLOBENZAPRINE HCL 5 MG PO TABS: 5.0000 mg | ORAL_TABLET | Freq: Three times a day (TID) | ORAL | Status: DC | PRN

## 2012-08-06 MED ORDER — BUTALBITAL-APAP-CAFFEINE 50-325-40 MG PO TABS: 1.0000 | ORAL_TABLET | Freq: Two times a day (BID) | ORAL | Status: AC | PRN

## 2012-08-06 MED ORDER — ALBUTEROL SULFATE HFA 108 (90 BASE) MCG/ACT IN AERS: 2.0000 | INHALATION_SPRAY | RESPIRATORY_TRACT | Status: DC | PRN

## 2012-08-06 MED ORDER — TESTOSTERONE CYPIONATE 100 MG/ML IM SOLN: 100.0000 mg | INTRAMUSCULAR | Status: DC

## 2012-08-06 MED ORDER — HYDROCHLOROTHIAZIDE 25 MG PO TABS: 25.0000 mg | ORAL_TABLET | Freq: Every day | ORAL | Status: DC

## 2012-08-06 MED ORDER — DICLOFENAC SODIUM 1 % TD GEL: 2.0000 g | Freq: Four times a day (QID) | TRANSDERMAL | Status: DC

## 2012-08-06 MED ORDER — DICLOFENAC SODIUM 75 MG PO TBEC: 75.0000 mg | DELAYED_RELEASE_TABLET | Freq: Two times a day (BID) | ORAL | Status: DC

## 2012-08-06 MED ORDER — LORAZEPAM 2 MG PO TABS: 2.0000 mg | ORAL_TABLET | Freq: Two times a day (BID) | ORAL | Status: DC

## 2012-08-06 MED ORDER — SIMVASTATIN 20 MG PO TABS: 20.0000 mg | ORAL_TABLET | Freq: Every evening | ORAL | Status: DC

## 2012-08-06 MED ORDER — DOXAZOSIN MESYLATE 1 MG PO TABS: 1.0000 mg | ORAL_TABLET | Freq: Every day | ORAL | Status: DC

## 2012-08-06 MED ORDER — AMITRIPTYLINE HCL 50 MG PO TABS: 25.0000 mg | ORAL_TABLET | Freq: Two times a day (BID) | ORAL | Status: DC

## 2012-08-06 MED ORDER — GABAPENTIN 300 MG PO CAPS: 600.0000 mg | ORAL_CAPSULE | Freq: Four times a day (QID) | ORAL | Status: DC

## 2012-08-06 NOTE — Patient Instructions (Addendum)
Thank you for coming in today Your refills have been sent to the pharmacy Please start using the injectable Testosterone, once every 14 days Come back in 3 months to have your bloodwork checked Come back as needed  Star the Doxazosin, Voltaren gel, flexeril If you have any low blood pressure symptoms cal lthe office and stop taking the Doxazosin.

## 2012-08-07 ENCOUNTER — Telehealth: Payer: Self-pay | Admitting: Family Medicine

## 2012-08-07 NOTE — Telephone Encounter (Signed)
States that the pharmacy ([facility] [address]) testosterone cypionate (DEPOTESTOTERONE CYPIONATE) 100 MG/ML injection  Is on permanent back order - (that strength) - wants to know what to do. Also - does this include the needles?

## 2012-08-08 ENCOUNTER — Other Ambulatory Visit: Payer: Self-pay | Admitting: Family Medicine

## 2012-08-08 MED ORDER — SYRINGE (DISPOSABLE) 1 ML MISC: Status: DC

## 2012-08-08 NOTE — Telephone Encounter (Signed)
Spoke to pharmacy and pt on phone.  100mg /ml in 10ml vial on back order 200mg /ml in 10ml not covered by insurance SYringes called in Pt to shop around at different pharmacies to find one that has Rx available.

## 2012-08-08 NOTE — Telephone Encounter (Signed)
Patient is calling about the Rx for his Testosterone.  He would like the Rx for the sent to [facility] on [address].  It is going to cost him and he will be strapped but he needs it.  He would like the syringes ordered as well.  Patient would appreciate a call back when this has been taken care of.

## 2012-08-09 ENCOUNTER — Encounter: Payer: Self-pay | Admitting: Family Medicine

## 2012-08-09 MED ORDER — TESTOSTERONE CYPIONATE 200 MG/ML IM SOLN: 100.0000 mg | INTRAMUSCULAR | Status: DC

## 2012-08-09 NOTE — Telephone Encounter (Signed)
Received memo concerning Rx  Testosterone 100mg /ml has been DC Testosterone 200mg /ml has been ordered  Routed memo and orders to support staff

## 2012-08-09 NOTE — Assessment & Plan Note (Signed)
Increase Neurontin to 695m QID Continue with pain clinic

## 2012-08-09 NOTE — Assessment & Plan Note (Signed)
Continue with pain clinic recs Increase Neurontin to 600mg  QID

## 2012-08-09 NOTE — Assessment & Plan Note (Signed)
Likely w/ BPH Start Doxazosin for continued urinary hesitency and for additional BP benefit. Will decrease Flomax as titrate up Doxazosin

## 2012-08-09 NOTE — Assessment & Plan Note (Signed)
Continue current regimen. Adding Doxazosin for urinary complaints but likley to improve BP

## 2012-08-09 NOTE — Progress Notes (Signed)
  Subjective:    Patient ID: [name] [name], male    DOB: [birth year], 51 y.o.   MRN: [id number]  HPI CC: Low testosterone, Neck pain  Neck pain: Chronic condition for pt. On chronic pain regimen prescribed by pain clinic. Recently tried voltaren gel w/ rrelief. Has not tried m. Relaxer. Denies any LOC, dizziness, AMS. Associated w/ HA.   Low testosterone: Reviewed labs and spent significant time discussing risks benefits of testosterone replacement. Pt agreable to prostate exam, aware of risks/benefits, and desiring to start replacement therapy.  Peripheral neuropathy. Neurontin 600TID w/ benefit. Does have times in between doses when becomes symptomatic again.   HTN: compliant w/ home therapy. Cenies CP, Palpitations, SOB  Urinary hesitancy. Continues to be a concern for pt. No benefit w/ Flomax 0.4. Denies dysuria    Review of Systems Per hpi    Objective:   Physical Exam Gen: Obese,  GU: Prostate symmetrical but enlarged GI: Normal feeling rectal vault. No gross blood CV: RRR      Assessment & Plan:

## 2012-08-09 NOTE — Assessment & Plan Note (Signed)
Risks benefits of replacement discussed in full Pt to start replacement therapy (100mg  Testosterone Every other week) Recheck PSA, Testosterone in 60mo then 42mo Recheck prostate in 6-12 mo

## 2012-08-09 NOTE — Telephone Encounter (Signed)
Rx fixed and LMOVM informing patient. [name] [name], [name] [name]

## 2012-08-13 ENCOUNTER — Other Ambulatory Visit: Payer: Self-pay | Admitting: Family Medicine

## 2012-08-16 ENCOUNTER — Other Ambulatory Visit: Payer: Self-pay | Admitting: Family Medicine

## 2012-09-09 ENCOUNTER — Other Ambulatory Visit: Payer: Self-pay | Admitting: Family Medicine

## 2012-09-09 DIAGNOSIS — F32A Depression, unspecified: Secondary | ICD-10-CM

## 2012-09-09 DIAGNOSIS — F329 Major depressive disorder, single episode, unspecified: Secondary | ICD-10-CM

## 2012-09-14 ENCOUNTER — Other Ambulatory Visit: Payer: Self-pay | Admitting: Family Medicine

## 2012-09-18 ENCOUNTER — Other Ambulatory Visit: Payer: Self-pay | Admitting: Family Medicine

## 2012-09-18 ENCOUNTER — Ambulatory Visit (INDEPENDENT_AMBULATORY_CARE_PROVIDER_SITE_OTHER): Payer: Medicaid Other | Admitting: Family Medicine

## 2012-09-18 ENCOUNTER — Encounter: Payer: Self-pay | Admitting: Family Medicine

## 2012-09-18 VITALS — BP 148/90 | HR 73 | Temp 98.1°F | Ht 71.0 in | Wt 271.0 lb

## 2012-09-18 DIAGNOSIS — I10 Essential (primary) hypertension: Secondary | ICD-10-CM

## 2012-09-18 DIAGNOSIS — R6882 Decreased libido: Secondary | ICD-10-CM

## 2012-09-18 DIAGNOSIS — F17200 Nicotine dependence, unspecified, uncomplicated: Secondary | ICD-10-CM

## 2012-09-18 DIAGNOSIS — Z23 Encounter for immunization: Secondary | ICD-10-CM

## 2012-09-18 DIAGNOSIS — Z720 Tobacco use: Secondary | ICD-10-CM

## 2012-09-18 MED ORDER — SYRINGE (DISPOSABLE) 1 ML MISC: Status: DC

## 2012-09-18 MED ORDER — AMITRIPTYLINE HCL 50 MG PO TABS: 100.0000 mg | ORAL_TABLET | Freq: Two times a day (BID) | ORAL | Status: DC

## 2012-09-18 NOTE — Assessment & Plan Note (Signed)
BP elevated today Elevation likely from being stressed due to fiance's attempted suicide and concerned about hernia Continue current therapy

## 2012-09-18 NOTE — Patient Instructions (Addendum)
You have a small hernia of the abdomen You this shouldn't give you any problems If it becomes pushed out and is painful and will not go back down then come in immedidately for evaluation Please continue taking all of your other medications as prescribed Please come in for your labwork around December 3rd.  Hernia A hernia occurs when an internal organ pushes out through a weak spot in the abdominal wall. Hernias most commonly occur in the groin and around the navel. Hernias often can be pushed back into place (reduced). Most hernias tend to get worse over time. Some abdominal hernias can get stuck in the opening (irreducible or incarcerated hernia) and cannot be reduced. An irreducible abdominal hernia which is tightly squeezed into the opening is at risk for impaired blood supply (strangulated hernia). A strangulated hernia is a medical emergency. Because of the risk for an irreducible or strangulated hernia, surgery may be recommended to repair a hernia. CAUSES   Heavy lifting.  Prolonged coughing.  Straining to have a bowel movement.  A cut (incision) made during an abdominal surgery. HOME CARE INSTRUCTIONS   Bed rest is not required. You may continue your normal activities.  Avoid lifting more than 10 pounds (4.5 kg) or straining.  Cough gently. If you are a smoker it is best to stop. Even the best hernia repair can break down with the continual strain of coughing. Even if you do not have your hernia repaired, a cough will continue to aggravate the problem.  Do not wear anything tight over your hernia. Do not try to keep it in with an outside bandage or truss. These can damage abdominal contents if they are trapped within the hernia sac.  Eat a normal diet.  Avoid constipation. Straining over long periods of time will increase hernia size and encourage breakdown of repairs. If you cannot do this with diet alone, stool softeners may be used. SEEK IMMEDIATE MEDICAL CARE IF:   You  have a fever.  You develop increasing abdominal pain.  You feel nauseous or vomit.  Your hernia is stuck outside the abdomen, looks discolored, feels hard, or is tender.  You have any changes in your bowel habits or in the hernia that are unusual for you.  You have increased pain or swelling around the hernia.  You cannot push the hernia back in place by applying gentle pressure while lying down. MAKE SURE YOU:   Understand these instructions.  Will watch your condition.  Will get help right away if you are not doing well or get worse. Document Released: 10/24/2005 Document Revised: 01/16/2012 Document Reviewed: 06/12/2008 [facility] Patient Information 2013 [place], [place].

## 2012-09-18 NOTE — Assessment & Plan Note (Signed)
Improving w/ testosterone Energy level improving Testosterone and PSA prior to next appt. On 10/16/12

## 2012-09-18 NOTE — Assessment & Plan Note (Signed)
Continues to smoke Under a lot of stress at this time  Pt not able to quit at this time

## 2012-09-18 NOTE — Progress Notes (Signed)
[name] [name] is a 51 y.o. male who presents to [facility] today for hernia   Hernia: Started over the last several days. Pt states that it may have been present prior to now but has never noticed it. Non-painful. Present superior ot umbilicus. Present when trying to go from lying to sitting, or when straining on the toilet. No h/o other hernias, no abdominal surgery  Tobacco: COntinues to smoke. Under a lot of stress and not willing ot quit at this time  HTN: Taking all BP meds. Denies CP, HA, SOB  Libido: Some improvement w/ testosterone shots. Increased energy since starting.   Constipation: uses multiple various methods for control. No cycling between mineral oil and milk of magnesia w/ significant improvement. No h/o GI malignancy or GI bleed.  The following portions of the patient's history were reviewed and updated as appropriate: allergies, current medications, past medical history, family and social history, and problem list.  Patient is a smoker  Past Medical History  Diagnosis Date  . Constipation due to pain medication   . COPD (chronic obstructive pulmonary disease)   . Status post spinal surgery   . Hypertension   . Anxiety     ROS as above otherwise neg.    Medications reviewed. Current Outpatient Prescriptions  Medication Sig Dispense Refill  . albuterol (PROVENTIL HFA;VENTOLIN HFA) 108 (90 BASE) MCG/ACT inhaler Inhale 2 puffs into the lungs every 4 (four) hours as needed for wheezing.  1 Inhaler  3  . amitriptyline (ELAVIL) 50 MG tablet Take 2 tablets (100 mg total) by mouth 2 (two) times daily.  60 tablet  3  . bisacodyl (DULCOLAX) 5 MG EC tablet Take 5 mg by mouth daily as needed.      . butalbital-acetaminophen-caffeine (FIORICET, ESGIC) 50-325-40 MG per tablet TAKE ONE TABLET BY MOUTH TWICE DAILY AS NEEDED FOR HEADACHE  60 tablet  0  . cyclobenzaprine (FLEXERIL) 5 MG tablet Take 1 tablet (5 mg total) by mouth 3 (three) times daily as needed for muscle spasms.  60 tablet  1   . diclofenac (VOLTAREN) 75 MG EC tablet Take 1 tablet (75 mg total) by mouth 2 (two) times daily.  30 tablet  6  . diclofenac sodium (VOLTAREN) 1 % GEL Apply 2 g topically 4 (four) times daily. As needed for pain  100 g  2  . doxazosin (CARDURA) 1 MG tablet Take 1 tablet (1 mg total) by mouth at bedtime.  30 tablet  3  . fluticasone (FLONASE) 50 MCG/ACT nasal spray USE TWO SPRAY EACH NOSTRIL TWICE DAILY AS NEEDED FOR  RHINITIS.  STOP  IF  YOU  DEVELOP  A  NOSE  BLEED  16 g  2  . gabapentin (NEURONTIN) 300 MG capsule TAKE TWO CAPSULES BY MOUTH THREE TIMES DAILY  180 capsule  1  . hydrochlorothiazide (HYDRODIURIL) 25 MG tablet Take 1 tablet (25 mg total) by mouth daily.  30 tablet  3  . lisinopril (PRINIVIL,ZESTRIL) 20 MG tablet Take 1 tablet (20 mg total) by mouth daily.  90 tablet  3  . loratadine (CLARITIN) 10 MG tablet Take 1 tablet (10 mg total) by mouth daily.  30 tablet  11  . LORazepam (ATIVAN) 2 MG tablet Take 1 tablet (2 mg total) by mouth 2 (two) times daily.  60 tablet  0  . lubiprostone (AMITIZA) 24 MCG capsule Take 24 mcg by mouth 2 (two) times daily with a meal.      . senna-docusate (SENOKOT-S) 8.6-50 MG  per tablet Take 1 tablet by mouth daily.  90 tablet  4  . simvastatin (ZOCOR) 20 MG tablet Take 1 tablet (20 mg total) by mouth every evening.  30 tablet  6  . Syringe, Disposable, 1 ML MISC Use one syringe for injection of testosterone.  25 each  3  . Tamsulosin HCl (FLOMAX) 0.4 MG CAPS Take 2 capsules (0.8 mg total) by mouth daily.  60 capsule  3  . testosterone cypionate (DEPOTESTOTERONE CYPIONATE) 200 MG/ML injection Inject 0.5 mLs (100 mg total) into the muscle every 14 (fourteen) days.  10 mL  0  . tiotropium (SPIRIVA) 18 MCG inhalation capsule Place 1 capsule (18 mcg total) into inhaler and inhale daily.  30 capsule  12  . traZODone (DESYREL) 100 MG tablet Take 1 tablet (100 mg total) by mouth at bedtime.  30 tablet  6    Exam: BP 148/90  Pulse 73  Temp 98.1 F (36.7 C)  (Oral)  Ht 5\' 11"  (1.803 m)  Wt 271 lb (122.925 kg)  BMI 37.80 kg/m2 Gen: Well NAD HEENT: EOMI,  MMM Abd: NABS, NT, ND, Ventral hernia from superior aspect of umbilicus approximately 10cm proximal. No bowel present.     No results found for this or any previous visit (from the past 72 hour(s)).

## 2012-09-30 ENCOUNTER — Other Ambulatory Visit: Payer: Self-pay | Admitting: Family Medicine

## 2012-09-30 ENCOUNTER — Encounter: Payer: Self-pay | Admitting: Family Medicine

## 2012-10-03 ENCOUNTER — Telehealth: Payer: Self-pay | Admitting: Family Medicine

## 2012-10-03 DIAGNOSIS — R6882 Decreased libido: Secondary | ICD-10-CM

## 2012-10-03 NOTE — Telephone Encounter (Signed)
Pt called to ask about labs needed for next visit.  He is sched for labs on 12/5 and needs orders placed. Also is asking for his refill on Ambien

## 2012-10-04 NOTE — Addendum Note (Signed)
Addended by: Konrad Dolores, DAVID J on: 10/04/2012 12:03 PM   Modules accepted: Orders

## 2012-10-04 NOTE — Assessment & Plan Note (Signed)
Labs ordered for pt to obtain prior to next appt

## 2012-10-05 ENCOUNTER — Other Ambulatory Visit: Payer: Self-pay | Admitting: Family Medicine

## 2012-10-05 DIAGNOSIS — F419 Anxiety disorder, unspecified: Secondary | ICD-10-CM

## 2012-10-07 ENCOUNTER — Other Ambulatory Visit: Payer: Self-pay | Admitting: Family Medicine

## 2012-10-07 DIAGNOSIS — F419 Anxiety disorder, unspecified: Secondary | ICD-10-CM

## 2012-10-07 MED ORDER — LORAZEPAM 2 MG PO TABS: 2.0000 mg | ORAL_TABLET | Freq: Two times a day (BID) | ORAL | Status: DC

## 2012-10-08 MED ORDER — LORAZEPAM 2 MG PO TABS: 2.0000 mg | ORAL_TABLET | Freq: Two times a day (BID) | ORAL | Status: DC

## 2012-10-08 NOTE — Progress Notes (Signed)
Rx called in and LMOVM informing pt so. Fleeger, Maryjo [place]

## 2012-10-11 ENCOUNTER — Other Ambulatory Visit: Payer: Medicaid Other

## 2012-10-11 DIAGNOSIS — R6882 Decreased libido: Secondary | ICD-10-CM

## 2012-10-11 NOTE — Progress Notes (Signed)
PSA AND TESTOSTERONE DONE TODAY [name] [name]

## 2012-10-12 LAB — PSA: PSA: 1.06 ng/mL (ref <=4.00)

## 2012-10-16 ENCOUNTER — Encounter: Payer: Self-pay | Admitting: Family Medicine

## 2012-10-16 ENCOUNTER — Ambulatory Visit (INDEPENDENT_AMBULATORY_CARE_PROVIDER_SITE_OTHER): Payer: Medicaid Other | Admitting: Family Medicine

## 2012-10-16 VITALS — BP 144/80 | HR 81 | Temp 98.4°F | Ht 71.0 in | Wt 273.0 lb

## 2012-10-16 DIAGNOSIS — F411 Generalized anxiety disorder: Secondary | ICD-10-CM

## 2012-10-16 DIAGNOSIS — F419 Anxiety disorder, unspecified: Secondary | ICD-10-CM

## 2012-10-16 DIAGNOSIS — R6882 Decreased libido: Secondary | ICD-10-CM

## 2012-10-16 DIAGNOSIS — J449 Chronic obstructive pulmonary disease, unspecified: Secondary | ICD-10-CM

## 2012-10-16 DIAGNOSIS — N139 Obstructive and reflux uropathy, unspecified: Secondary | ICD-10-CM

## 2012-10-16 DIAGNOSIS — R21 Rash and other nonspecific skin eruption: Secondary | ICD-10-CM

## 2012-10-16 DIAGNOSIS — M25569 Pain in unspecified knee: Secondary | ICD-10-CM

## 2012-10-16 DIAGNOSIS — G971 Other reaction to spinal and lumbar puncture: Secondary | ICD-10-CM

## 2012-10-16 DIAGNOSIS — J4489 Other specified chronic obstructive pulmonary disease: Secondary | ICD-10-CM

## 2012-10-16 DIAGNOSIS — G8929 Other chronic pain: Secondary | ICD-10-CM

## 2012-10-16 MED ORDER — GABAPENTIN 300 MG PO CAPS: 600.0000 mg | ORAL_CAPSULE | Freq: Four times a day (QID) | ORAL | Status: DC

## 2012-10-16 MED ORDER — FINASTERIDE 5 MG PO TABS: 5.0000 mg | ORAL_TABLET | Freq: Every day | ORAL | Status: DC

## 2012-10-16 MED ORDER — ALBUTEROL SULFATE HFA 108 (90 BASE) MCG/ACT IN AERS: 2.0000 | INHALATION_SPRAY | RESPIRATORY_TRACT | Status: DC | PRN

## 2012-10-16 MED ORDER — DICLOFENAC SODIUM 1 % TD GEL: 2.0000 g | Freq: Four times a day (QID) | TRANSDERMAL | Status: DC

## 2012-10-16 MED ORDER — GABAPENTIN 300 MG PO CAPS: 600.0000 mg | ORAL_CAPSULE | Freq: Three times a day (TID) | ORAL | Status: DC

## 2012-10-16 MED ORDER — CYCLOBENZAPRINE HCL 5 MG PO TABS: 5.0000 mg | ORAL_TABLET | Freq: Three times a day (TID) | ORAL | Status: DC | PRN

## 2012-10-16 MED ORDER — LORAZEPAM 2 MG PO TABS: 2.0000 mg | ORAL_TABLET | Freq: Two times a day (BID) | ORAL | Status: DC

## 2012-10-16 NOTE — Patient Instructions (Addendum)
Thank you for coming in today. Please come back to have your blood work done prior to your 3 mo check up Please continue to consider a colonoscopy Please follow up with me as needed for any further concerns prior to your 3 month check up Have a great Christmas and New Years.

## 2012-10-18 ENCOUNTER — Encounter: Payer: Self-pay | Admitting: Family Medicine

## 2012-10-18 NOTE — Assessment & Plan Note (Signed)
Continue cardura and flomax Adding finasteride today as PSA elevated from previous measuremenmt and on testosterone

## 2012-10-18 NOTE — Assessment & Plan Note (Signed)
improveing w/ injections Continue current regimen Recheck PSA, prostate exam, and am testosterone in 3 mo

## 2012-10-18 NOTE — Progress Notes (Signed)
[name] [name] is a 51 y.o. male who presents to [facility] today for testosterone f/u  Low libido: improved since starting testosterone. Improved energy. Denies angry outbursts, facial acne, testicular atrophy as near as pt can tell. Injecting in sterile manner as prescribed.   Urinary difficulty: Elevated PSA since starting testosterone. Continues to take cardura and flomax w/ significant benefit. No abd pain, dysuria, hematuria  Rash: rash on legs w/o extension (no new spots) old spots are fading to light brown. LE edema is improving.   COPD: Using albuterol approximately 3x wkly. No night time awakenings. NO ED visits for resp complaints   The following portions of the patient's history were reviewed and updated as appropriate: allergies, current medications, past medical history, family and social history, and problem list.  Patient is a smoker  Past Medical History  Diagnosis Date  . Constipation due to pain medication   . COPD (chronic obstructive pulmonary disease)   . Status post spinal surgery   . Hypertension   . Anxiety     ROS as above otherwise neg.    Medications reviewed. Current Outpatient Prescriptions  Medication Sig Dispense Refill  . albuterol (PROVENTIL HFA;VENTOLIN HFA) 108 (90 BASE) MCG/ACT inhaler Inhale 2 puffs into the lungs every 4 (four) hours as needed for wheezing.  1 Inhaler  3  . amitriptyline (ELAVIL) 50 MG tablet Take 2 tablets (100 mg total) by mouth 2 (two) times daily.  60 tablet  3  . bisacodyl (DULCOLAX) 5 MG EC tablet Take 5 mg by mouth daily as needed.      . butalbital-acetaminophen-caffeine (FIORICET, ESGIC) 50-325-40 MG per tablet TAKE ONE TABLET BY MOUTH TWICE DAILY AS NEEDED FOR HEADACHE  60 tablet  0  . cyclobenzaprine (FLEXERIL) 5 MG tablet Take 1 tablet (5 mg total) by mouth 3 (three) times daily as needed for muscle spasms.  90 tablet  3  . diclofenac (VOLTAREN) 75 MG EC tablet Take 1 tablet (75 mg total) by mouth 2 (two) times daily.  30 tablet   6  . diclofenac sodium (VOLTAREN) 1 % GEL Apply 2 g topically 4 (four) times daily. As needed for pain  100 g  2  . doxazosin (CARDURA) 1 MG tablet Take 1 tablet (1 mg total) by mouth at bedtime.  30 tablet  3  . finasteride (PROSCAR) 5 MG tablet Take 1 tablet (5 mg total) by mouth daily.  30 tablet  3  . fluticasone (FLONASE) 50 MCG/ACT nasal spray USE TWO SPRAY EACH NOSTRIL TWICE DAILY AS NEEDED FOR  RHINITIS.  STOP  IF  YOU  DEVELOP  A  NOSE  BLEED  16 g  2  . gabapentin (NEURONTIN) 300 MG capsule Take 2 capsules (600 mg total) by mouth 4 (four) times daily.  240 capsule  3  . hydrochlorothiazide (HYDRODIURIL) 25 MG tablet Take 1 tablet (25 mg total) by mouth daily.  30 tablet  3  . lisinopril (PRINIVIL,ZESTRIL) 20 MG tablet Take 1 tablet (20 mg total) by mouth daily.  90 tablet  3  . loratadine (CLARITIN) 10 MG tablet Take 1 tablet (10 mg total) by mouth daily.  30 tablet  11  . LORazepam (ATIVAN) 2 MG tablet Take 1 tablet (2 mg total) by mouth 2 (two) times daily.  60 tablet  0  . LORazepam (ATIVAN) 2 MG tablet Take 1 tablet (2 mg total) by mouth 2 (two) times daily.  60 tablet  0  . lubiprostone (AMITIZA) 24 MCG capsule  Take 24 mcg by mouth 2 (two) times daily with a meal.      . senna-docusate (SENOKOT-S) 8.6-50 MG per tablet Take 1 tablet by mouth daily.  90 tablet  4  . simvastatin (ZOCOR) 20 MG tablet Take 1 tablet (20 mg total) by mouth every evening.  30 tablet  6  . Syringe, Disposable, 1 ML MISC Use one syringe for injection of testosterone.  25 each  3  . Tamsulosin HCl (FLOMAX) 0.4 MG CAPS Take 2 capsules (0.8 mg total) by mouth daily.  60 capsule  3  . testosterone cypionate (DEPOTESTOTERONE CYPIONATE) 200 MG/ML injection Inject 0.5 mLs (100 mg total) into the muscle every 14 (fourteen) days.  10 mL  0  . tiotropium (SPIRIVA) 18 MCG inhalation capsule Place 1 capsule (18 mcg total) into inhaler and inhale daily.  30 capsule  12  . traZODone (DESYREL) 100 MG tablet Take 1 tablet  (100 mg total) by mouth at bedtime.  30 tablet  6    Exam:  BP 144/80  Pulse 81  Temp 98.4 F (36.9 C) (Oral)  Ht 5\' 11"  (1.803 m)  Wt 273 lb (123.832 kg)  BMI 38.08 kg/m2    No results found for this or any previous visit (from the past 72 hour(s)).

## 2012-10-18 NOTE — Assessment & Plan Note (Signed)
resolving

## 2012-10-18 NOTE — Assessment & Plan Note (Signed)
Continue albuterol PRN No need for controller med at this time No need for home O2

## 2012-11-13 ENCOUNTER — Other Ambulatory Visit: Payer: Self-pay | Admitting: Family Medicine

## 2012-11-13 ENCOUNTER — Encounter: Payer: Self-pay | Admitting: Family Medicine

## 2012-11-13 DIAGNOSIS — R7989 Other specified abnormal findings of blood chemistry: Secondary | ICD-10-CM

## 2012-11-16 ENCOUNTER — Other Ambulatory Visit: Payer: Self-pay | Admitting: Family Medicine

## 2012-11-16 DIAGNOSIS — F419 Anxiety disorder, unspecified: Secondary | ICD-10-CM

## 2012-11-16 MED ORDER — LORAZEPAM 2 MG PO TABS: 2.0000 mg | ORAL_TABLET | Freq: Two times a day (BID) | ORAL | Status: DC

## 2012-11-16 MED ORDER — BUTALBITAL-APAP-CAFFEINE 50-325-40 MG PO TABS: 1.0000 | ORAL_TABLET | Freq: Two times a day (BID) | ORAL | Status: DC | PRN

## 2012-11-16 NOTE — Telephone Encounter (Signed)
Labs ordered to be obtained on or after 01/09/13 for f/u visit after lab draw

## 2012-11-16 NOTE — Addendum Note (Signed)
Addended by: Ozella Rocks on: 11/16/2012 08:44 AM   Modules accepted: Orders

## 2012-11-16 NOTE — Addendum Note (Signed)
Addended by: Ozella Rocks on: 11/16/2012 08:42 AM   Modules accepted: Orders

## 2012-11-23 ENCOUNTER — Other Ambulatory Visit: Payer: Self-pay | Admitting: *Deleted

## 2012-11-23 DIAGNOSIS — J449 Chronic obstructive pulmonary disease, unspecified: Secondary | ICD-10-CM

## 2012-11-23 MED ORDER — ALBUTEROL SULFATE HFA 108 (90 BASE) MCG/ACT IN AERS: 2.0000 | INHALATION_SPRAY | RESPIRATORY_TRACT | Status: DC | PRN

## 2012-12-25 ENCOUNTER — Other Ambulatory Visit: Payer: Self-pay | Admitting: Family Medicine

## 2012-12-25 DIAGNOSIS — J449 Chronic obstructive pulmonary disease, unspecified: Secondary | ICD-10-CM

## 2012-12-25 MED ORDER — TIOTROPIUM BROMIDE MONOHYDRATE 18 MCG IN CAPS: 18.0000 ug | ORAL_CAPSULE | Freq: Every day | RESPIRATORY_TRACT | Status: DC

## 2012-12-26 ENCOUNTER — Other Ambulatory Visit: Payer: Self-pay | Admitting: Family Medicine

## 2012-12-26 ENCOUNTER — Other Ambulatory Visit: Payer: Medicaid Other

## 2013-01-14 ENCOUNTER — Other Ambulatory Visit: Payer: Medicaid Other

## 2013-01-14 ENCOUNTER — Other Ambulatory Visit: Payer: Self-pay | Admitting: Family Medicine

## 2013-01-14 DIAGNOSIS — R7989 Other specified abnormal findings of blood chemistry: Secondary | ICD-10-CM

## 2013-01-14 NOTE — Progress Notes (Signed)
TESTOSTERONE AND PSA DONE TODAY MARCI HOLDER 

## 2013-01-15 ENCOUNTER — Encounter: Payer: Self-pay | Admitting: Family Medicine

## 2013-01-15 ENCOUNTER — Ambulatory Visit (INDEPENDENT_AMBULATORY_CARE_PROVIDER_SITE_OTHER): Payer: Medicaid Other | Admitting: Family Medicine

## 2013-01-15 VITALS — BP 151/80 | HR 81 | Ht 71.0 in | Wt 269.5 lb

## 2013-01-15 DIAGNOSIS — E785 Hyperlipidemia, unspecified: Secondary | ICD-10-CM

## 2013-01-15 DIAGNOSIS — R6882 Decreased libido: Secondary | ICD-10-CM

## 2013-01-15 DIAGNOSIS — E669 Obesity, unspecified: Secondary | ICD-10-CM | POA: Insufficient documentation

## 2013-01-15 DIAGNOSIS — G8929 Other chronic pain: Secondary | ICD-10-CM

## 2013-01-15 DIAGNOSIS — Z720 Tobacco use: Secondary | ICD-10-CM

## 2013-01-15 DIAGNOSIS — F17200 Nicotine dependence, unspecified, uncomplicated: Secondary | ICD-10-CM

## 2013-01-15 DIAGNOSIS — E66812 Obesity, class 2: Secondary | ICD-10-CM | POA: Insufficient documentation

## 2013-01-15 DIAGNOSIS — N139 Obstructive and reflux uropathy, unspecified: Secondary | ICD-10-CM

## 2013-01-15 MED ORDER — HYDROCHLOROTHIAZIDE 25 MG PO TABS: 25.0000 mg | ORAL_TABLET | Freq: Every day | ORAL | Status: DC

## 2013-01-15 NOTE — Assessment & Plan Note (Signed)
No complaints, doing well  Continue the cardura and finasteride

## 2013-01-15 NOTE — Progress Notes (Signed)
[name] [name] is a 52 y.o. male who presents to [facility] today for pain referral   Chronic pain: pt very frustrated w/ current pain mgt at [facility] pain clinic. Continues to have severe pain. Would like to go to Dr. Beryle Beams. Taking medications as prescribed but having a hard time getting medicines approved and filled in a timely manner. Not looking for additional pain medications. Difficulty w/ constipation that previous alternating regimen has not worked for. Took [place] >3mo to get leanest prescribed, which was not acceptable for the pt. Has not been able to get a hold of an actual pain doctor, always gets the call center.   Tobacco: Has cut down to 3/4 to 1ppd. Which is down from 1-2ppd. E-cig has made the difference. Denies fevers, unintentional wt loss, night sweats, hemoptosis, cough. Breathing better.     Wt loss: decreasing junk food into diet. Walking more but limited due to pain.   Testosterone: Libido about the same. Spouse under a lot of stress. Energy level unchanged since last appointment. Able to fully empty bladder. Can pee a large volume w/o feeling like there is a lot of pressure built up. Continues to do 100mg  BID.   The following portions of the patient's history were reviewed and updated as appropriate: allergies, current medications, past medical history, family and social history, and problem list.  Patient is a nonsmoker   Past Medical History  Diagnosis Date  . Constipation due to pain medication   . COPD (chronic obstructive pulmonary disease)   . Status post spinal surgery   . Hypertension   . Anxiety     ROS as above otherwise neg.    Medications reviewed. Current Outpatient Prescriptions  Medication Sig Dispense Refill  . albuterol (PROVENTIL HFA;VENTOLIN HFA) 108 (90 BASE) MCG/ACT inhaler Inhale 2 puffs into the lungs every 4 (four) hours as needed for wheezing.  1 Inhaler  3  . amitriptyline (ELAVIL) 50 MG tablet Take 2 tablets (100 mg total) by mouth 2 (two)  times daily.  60 tablet  3  . bisacodyl (DULCOLAX) 5 MG EC tablet Take 5 mg by mouth daily as needed.      . butalbital-acetaminophen-caffeine (FIORICET, ESGIC) 50-325-40 MG per tablet Take 1 tablet by mouth 2 (two) times daily as needed for headache.  60 tablet  0  . cyclobenzaprine (FLEXERIL) 5 MG tablet Take 1 tablet (5 mg total) by mouth 3 (three) times daily as needed for muscle spasms.  90 tablet  3  . diclofenac (VOLTAREN) 75 MG EC tablet Take 1 tablet (75 mg total) by mouth 2 (two) times daily.  30 tablet  6  . diclofenac sodium (VOLTAREN) 1 % GEL Apply 2 g topically 4 (four) times daily. As needed for pain  100 g  2  . doxazosin (CARDURA) 1 MG tablet Take 1 tablet (1 mg total) by mouth at bedtime.  30 tablet  3  . finasteride (PROSCAR) 5 MG tablet Take 1 tablet (5 mg total) by mouth daily.  30 tablet  3  . fluticasone (FLONASE) 50 MCG/ACT nasal spray USE TWO SPRAY EACH NOSTRIL TWICE DAILY AS NEEDED FOR  RHINITIS,  STOP  IF  YOU  DEVELOP  A  NOSE  BLEED  16 g  3  . gabapentin (NEURONTIN) 300 MG capsule Take 2 capsules (600 mg total) by mouth 4 (four) times daily.  240 capsule  3  . hydrochlorothiazide (HYDRODIURIL) 25 MG tablet Take 1 tablet (25 mg total) by mouth daily.  30 tablet  3  . lisinopril (PRINIVIL,ZESTRIL) 20 MG tablet Take 1 tablet (20 mg total) by mouth daily.  90 tablet  3  . loratadine (CLARITIN) 10 MG tablet Take 1 tablet (10 mg total) by mouth daily.  30 tablet  11  . LORazepam (ATIVAN) 2 MG tablet Take 1 tablet (2 mg total) by mouth 2 (two) times daily.  60 tablet  0  . LORazepam (ATIVAN) 2 MG tablet Take 1 tablet (2 mg total) by mouth 2 (two) times daily.  60 tablet  0  . lubiprostone (AMITIZA) 24 MCG capsule Take 24 mcg by mouth 2 (two) times daily with a meal.      . senna-docusate (SENOKOT-S) 8.6-50 MG per tablet Take 1 tablet by mouth daily.  90 tablet  4  . simvastatin (ZOCOR) 20 MG tablet Take 1 tablet (20 mg total) by mouth every evening.  30 tablet  6  . Syringe,  Disposable, 1 ML MISC Use one syringe for injection of testosterone.  25 each  3  . Tamsulosin HCl (FLOMAX) 0.4 MG CAPS Take 2 capsules (0.8 mg total) by mouth daily.  60 capsule  3  . testosterone cypionate (DEPOTESTOTERONE CYPIONATE) 200 MG/ML injection Inject 0.5 mLs (100 mg total) into the muscle every 14 (fourteen) days.  10 mL  0  . tiotropium (SPIRIVA) 18 MCG inhalation capsule Place 1 capsule (18 mcg total) into inhaler and inhale daily.  30 capsule  12  . traZODone (DESYREL) 100 MG tablet Take 1 tablet (100 mg total) by mouth at bedtime.  30 tablet  6   No current facility-administered medications for this visit.    Exam: BP 151/80  Pulse 81  Ht 5\' 11"  (1.803 m)  Wt 269 lb 8 oz (122.244 kg)  BMI 37.6 kg/m2 Gen: Well NAD HEENT: EOMI,  MMM   Results for orders placed in visit on 01/14/13 (from the past 72 hour(s))  TESTOSTERONE     Status: Abnormal   Collection Time    01/14/13  9:07 AM      Result Value Range   Testosterone 154 (*) 300 - 890 ng/dL   Comment:           Tanner Stage       Male              Male                   I              < 30 ng/dL        < 10 ng/dL                   II             < 150 ng/dL       < 30 ng/dL                   III            100-320 ng/dL     < 35 ng/dL                   IV             200-970 ng/dL     16-10 ng/dL                   V/Adult        300-890 ng/dL  10-70 ng/dL        PSA     Status: None   Collection Time    01/14/13  9:07 AM      Result Value Range   PSA 0.96  <=4.00 ng/mL   Comment: Test Methodology: ECLIA PSA (Electrochemiluminescence Immunoassay)           For PSA values from 2.5-4.0, particularly in younger men <60 years     old, the AUA and NCCN suggest testing for % Free PSA (3515) and     evaluation of the rate of increase in PSA (PSA velocity).

## 2013-01-15 NOTE — Assessment & Plan Note (Signed)
Continue wt loss efforts.  Exercise limited secondary to pain. Will refer to dietician if desires in the future

## 2013-01-15 NOTE — Assessment & Plan Note (Addendum)
Change pain clinic from [facility] to Dr. Beryle Beams [phone]) Will place referral

## 2013-01-15 NOTE — Assessment & Plan Note (Signed)
Doing great. Continue w/ E-cig Cut back on nicotine intake as possible  Pt w/ excellent understanding of how to properly use E-cig to quit.  Spent > discussing

## 2013-01-15 NOTE — Assessment & Plan Note (Signed)
Recheck LDL todya

## 2013-01-15 NOTE — Patient Instructions (Addendum)
You are doing great Continue to lose weight, and use the E-cig.  Come back to see me in 1 month to discuss blood pressure, and increasing your testosterone. I will send in the referral to Dr. Fredna Dow office.  Have a great one.

## 2013-01-15 NOTE — Assessment & Plan Note (Addendum)
Will likely increase dose in 1 month Last lab taken 17 days after previous dose. Prior lab was taken about 7 days after previous dose. This would explain the decrease level on the last lab level.  Pt to return in 1 month to discuss

## 2013-01-17 ENCOUNTER — Ambulatory Visit: Payer: Medicaid Other | Admitting: Family Medicine

## 2013-01-22 ENCOUNTER — Ambulatory Visit: Payer: Medicaid Other | Admitting: Family Medicine

## 2013-01-28 ENCOUNTER — Other Ambulatory Visit: Payer: Self-pay | Admitting: *Deleted

## 2013-01-28 MED ORDER — BUTALBITAL-APAP-CAFFEINE 50-325-40 MG PO TABS: 1.0000 | ORAL_TABLET | Freq: Two times a day (BID) | ORAL | Status: DC | PRN

## 2013-01-31 ENCOUNTER — Encounter: Payer: Self-pay | Admitting: Family Medicine

## 2013-02-01 ENCOUNTER — Telehealth: Payer: Self-pay | Admitting: *Deleted

## 2013-02-01 NOTE — Telephone Encounter (Signed)
Patient calls because refill on Fiorcet has not been received by pharmacy." Print "was selected  when entered in med list  but I am unable to locate an RX in front office that was printed for the patient. Consulted with  Dr. Leveda Anna and he advised OK to call in.

## 2013-02-05 ENCOUNTER — Other Ambulatory Visit: Payer: Self-pay | Admitting: *Deleted

## 2013-02-07 MED ORDER — GABAPENTIN 300 MG PO CAPS: 600.0000 mg | ORAL_CAPSULE | Freq: Four times a day (QID) | ORAL | Status: DC

## 2013-02-12 ENCOUNTER — Other Ambulatory Visit: Payer: Self-pay | Admitting: Family Medicine

## 2013-02-13 ENCOUNTER — Telehealth: Payer: Self-pay | Admitting: *Deleted

## 2013-02-13 ENCOUNTER — Other Ambulatory Visit: Payer: Self-pay | Admitting: Family Medicine

## 2013-02-13 DIAGNOSIS — R6882 Decreased libido: Secondary | ICD-10-CM

## 2013-02-13 DIAGNOSIS — F419 Anxiety disorder, unspecified: Secondary | ICD-10-CM

## 2013-02-13 MED ORDER — TESTOSTERONE CYPIONATE 200 MG/ML IM SOLN: 200.0000 mg | INTRAMUSCULAR | Status: DC

## 2013-02-13 MED ORDER — HYDROCHLOROTHIAZIDE 25 MG PO TABS: 25.0000 mg | ORAL_TABLET | Freq: Every day | ORAL | Status: DC

## 2013-02-13 MED ORDER — LORAZEPAM 2 MG PO TABS: 2.0000 mg | ORAL_TABLET | Freq: Two times a day (BID) | ORAL | Status: DC

## 2013-02-13 MED ORDER — DICLOFENAC SODIUM 75 MG PO TBEC: DELAYED_RELEASE_TABLET | ORAL | Status: DC

## 2013-02-13 NOTE — Telephone Encounter (Signed)
Ativan refilled in Epic per Dr. Konrad Dolores and phoned into [facility]  Gaylene Brooks, RN

## 2013-02-13 NOTE — Assessment & Plan Note (Addendum)
Increasing dose of testosterone to 200mg  Q14 days Will need to have next level tested 7 days after prior dose.  Future lab order placed Pt notified of changes

## 2013-02-18 ENCOUNTER — Telehealth: Payer: Self-pay | Admitting: Family Medicine

## 2013-02-18 ENCOUNTER — Other Ambulatory Visit: Payer: Self-pay | Admitting: *Deleted

## 2013-02-18 DIAGNOSIS — G971 Other reaction to spinal and lumbar puncture: Secondary | ICD-10-CM

## 2013-02-18 MED ORDER — FINASTERIDE 5 MG PO TABS: 5.0000 mg | ORAL_TABLET | Freq: Every day | ORAL | Status: DC

## 2013-02-18 MED ORDER — DICLOFENAC SODIUM 1 % TD GEL: 2.0000 g | Freq: Four times a day (QID) | TRANSDERMAL | Status: DC

## 2013-02-18 NOTE — Telephone Encounter (Signed)
Patient has a question about HCTZ dosage that he really wants to speak to Dr. Konrad Dolores about soon.

## 2013-02-18 NOTE — Telephone Encounter (Signed)
LMOVM for pt to return call.  After reviewing meds, his last Rx was to take 1 whole tablet (25mg ).  I contacted [facility] on [address] ([place]) They state that they filled an Rx in March but that it was dated in September.  After reviewing meds I see that the rx that was wrote in September was for 1/2 tablet.  I wonder if pt turned in the wrong Rx.  Does he have another one dated for April?  Will await call back from patient.  Patient may need an appt to straighten this out.  Rmani Kellogg, Maryjo Rochester

## 2013-02-18 NOTE — Telephone Encounter (Signed)
The way he has been taking it is 1 25mg  tab per [facility], but the way his new bottle says is 1 1/2tab per [facility].  But he didn't remember Dr. Konrad Dolores saying anything about it changing, and after reviewing the notes from the last appt, I didn't see a change either, so he needs to know for sure from Dr. Evelena Peat so he knows for sure how to take his meds.

## 2013-02-22 ENCOUNTER — Telehealth: Payer: Self-pay | Admitting: Family Medicine

## 2013-02-22 NOTE — Telephone Encounter (Signed)
Spoke to pt about medications [facility] refilled his HCTZ as 25mg  tabs take 1/2 tab daily instead of 25mg  daily. Last Rx in Epic shows 25mg  Qday. Recall speaking to [facility] over the phone giving verbal order sometime previously and I suspect the pharmacy made an error.  Pt reminded to take 25mg  daily.   Shelly Flatten, MD Family Medicine PGY-2 02/22/2013, 5:41 PM

## 2013-02-26 ENCOUNTER — Other Ambulatory Visit: Payer: Self-pay | Admitting: *Deleted

## 2013-02-26 ENCOUNTER — Other Ambulatory Visit: Payer: Self-pay | Admitting: Family Medicine

## 2013-02-27 MED ORDER — BUTALBITAL-APAP-CAFFEINE 50-325-40 MG PO TABS: 1.0000 | ORAL_TABLET | Freq: Two times a day (BID) | ORAL | Status: DC | PRN

## 2013-03-20 ENCOUNTER — Other Ambulatory Visit: Payer: Self-pay | Admitting: *Deleted

## 2013-03-21 ENCOUNTER — Telehealth: Payer: Self-pay | Admitting: Family Medicine

## 2013-03-21 DIAGNOSIS — R6882 Decreased libido: Secondary | ICD-10-CM

## 2013-03-21 MED ORDER — CYCLOBENZAPRINE HCL 5 MG PO TABS: 5.0000 mg | ORAL_TABLET | Freq: Three times a day (TID) | ORAL | Status: DC | PRN

## 2013-03-21 MED ORDER — BUTALBITAL-APAP-CAFFEINE 50-325-40 MG PO TABS: 1.0000 | ORAL_TABLET | Freq: Two times a day (BID) | ORAL | Status: DC | PRN

## 2013-03-21 NOTE — Assessment & Plan Note (Signed)
Orders in for PSA, Testosterone, and CMET

## 2013-03-21 NOTE — Telephone Encounter (Signed)
Spoke to pt by phone  Aware of labs needign to be drawn prior to next  To see pt in a couple of weeks  Shelly Flatten, MD Family Medicine PGY-2 03/21/2013, 1:41 PM

## 2013-03-28 ENCOUNTER — Other Ambulatory Visit: Payer: Self-pay | Admitting: Family Medicine

## 2013-03-28 ENCOUNTER — Other Ambulatory Visit: Payer: Medicaid Other

## 2013-03-28 DIAGNOSIS — R6882 Decreased libido: Secondary | ICD-10-CM

## 2013-03-28 LAB — COMPREHENSIVE METABOLIC PANEL
AST: 14 U/L (ref 0–37)
Albumin: 3.7 g/dL (ref 3.5–5.2)
BUN: 11 mg/dL (ref 6–23)
CO2: 26 mEq/L (ref 19–32)
Calcium: 9 mg/dL (ref 8.4–10.5)
Chloride: 101 mEq/L (ref 96–112)
Creat: 0.98 mg/dL (ref 0.50–1.35)
Glucose, Bld: 85 mg/dL (ref 70–99)
Potassium: 4.2 mEq/L (ref 3.5–5.3)

## 2013-03-28 LAB — TESTOSTERONE: Testosterone: 1288 ng/dL — ABNORMAL HIGH (ref 300–890)

## 2013-03-28 NOTE — Progress Notes (Signed)
CMP,TESTOSTERONE AND PSA DONE TODAY [name] [name]

## 2013-04-05 ENCOUNTER — Encounter: Payer: Self-pay | Admitting: Family Medicine

## 2013-04-05 ENCOUNTER — Ambulatory Visit (INDEPENDENT_AMBULATORY_CARE_PROVIDER_SITE_OTHER): Payer: Medicaid Other | Admitting: Family Medicine

## 2013-04-05 VITALS — BP 132/85 | HR 65 | Temp 98.3°F | Ht 71.0 in | Wt 273.0 lb

## 2013-04-05 DIAGNOSIS — S0991XA Unspecified injury of ear, initial encounter: Secondary | ICD-10-CM

## 2013-04-05 DIAGNOSIS — F411 Generalized anxiety disorder: Secondary | ICD-10-CM

## 2013-04-05 DIAGNOSIS — R6882 Decreased libido: Secondary | ICD-10-CM

## 2013-04-05 DIAGNOSIS — F419 Anxiety disorder, unspecified: Secondary | ICD-10-CM

## 2013-04-05 DIAGNOSIS — S0993XA Unspecified injury of face, initial encounter: Secondary | ICD-10-CM

## 2013-04-05 DIAGNOSIS — I10 Essential (primary) hypertension: Secondary | ICD-10-CM

## 2013-04-05 DIAGNOSIS — S199XXA Unspecified injury of neck, initial encounter: Secondary | ICD-10-CM

## 2013-04-05 MED ORDER — LORAZEPAM 2 MG PO TABS: 2.0000 mg | ORAL_TABLET | Freq: Two times a day (BID) | ORAL | Status: DC

## 2013-04-05 MED ORDER — TESTOSTERONE CYPIONATE 200 MG/ML IM SOLN: 100.0000 mg | INTRAMUSCULAR | Status: DC

## 2013-04-05 MED ORDER — CIPROFLOXACIN-DEXAMETHASONE 0.3-0.1 % OT SUSP: 4.0000 [drp] | Freq: Two times a day (BID) | OTIC | Status: DC

## 2013-04-05 NOTE — Patient Instructions (Addendum)
Decrease your testosterone to 100mg   I've refilled your Ativan Start the ciprodex in your ear if it becomes painful or becomes more irritated

## 2013-04-05 NOTE — Progress Notes (Signed)
[name] [name] is a 52 y.o. male who presents to [facility] today for *  Testosterone: improved libido and energy. Deneis any difficulty w/ urination or dysuria above baseline. Taking flomax an dproscar.   Tobacco: 1ppd.. Using E cig but not sure of how much nicotine in vial. Will look up.   Ear: blood after wiping ear yesterday evening after itching ear. Qtip last night w/ more blood. No pain. No change in hearing.   The following portions of the patient's history were reviewed and updated as appropriate: allergies, current medications, past medical history, family and social history, and problem list.  Patient is a smoking 1ppd  Past Medical History  Diagnosis Date  . Constipation due to pain medication   . COPD (chronic obstructive pulmonary disease)   . Status post spinal surgery   . Hypertension   . Anxiety     ROS as above otherwise neg.    Medications reviewed. Current Outpatient Prescriptions  Medication Sig Dispense Refill  . amitriptyline (ELAVIL) 50 MG tablet TAKE TWO TABLETS BY MOUTH TWICE DAILY  60 tablet  0  . bisacodyl (DULCOLAX) 5 MG EC tablet Take 5 mg by mouth daily as needed.      . butalbital-acetaminophen-caffeine (FIORICET, ESGIC) 50-325-40 MG per tablet Take 1 tablet by mouth 2 (two) times daily as needed for headache.  10 tablet  0  . cyclobenzaprine (FLEXERIL) 5 MG tablet TAKE ONE TABLET BY MOUTH THREE TIMES DAILY AS NEEDED FOR MUSCLE SPASM  60 tablet  0  . cyclobenzaprine (FLEXERIL) 5 MG tablet Take 1 tablet (5 mg total) by mouth 3 (three) times daily as needed for muscle spasms.  90 tablet  3  . diclofenac (VOLTAREN) 75 MG EC tablet TAKE ONE TABLET BY MOUTH TWICE DAILY  30 tablet  0  . diclofenac sodium (VOLTAREN) 1 % GEL Apply 2 g topically 4 (four) times daily. As needed for pain  100 g  2  . doxazosin (CARDURA) 1 MG tablet TAKE ONE TABLET BY MOUTH AT BEDTIME  30 tablet  0  . finasteride (PROSCAR) 5 MG tablet Take 1 tablet (5 mg total) by mouth daily.  30 tablet  3   . fluticasone (FLONASE) 50 MCG/ACT nasal spray USE TWO SPRAY IN EACH NOSTRIL TWICE DAILY AS NEEDED FOR  RHINITIS,  STOP  IF  YOU  DEVELOP  A  NOSE  BLEED  16 g  1  . gabapentin (NEURONTIN) 300 MG capsule Take 2 capsules (600 mg total) by mouth 4 (four) times daily.  240 capsule  3  . hydrochlorothiazide (HYDRODIURIL) 25 MG tablet Take 1 tablet (25 mg total) by mouth daily.  30 tablet  3  . lisinopril (PRINIVIL,ZESTRIL) 20 MG tablet Take 1 tablet (20 mg total) by mouth daily.  90 tablet  3  . loratadine (CLARITIN) 10 MG tablet Take 1 tablet (10 mg total) by mouth daily.  30 tablet  11  . LORazepam (ATIVAN) 2 MG tablet Take 1 tablet (2 mg total) by mouth 2 (two) times daily.  60 tablet  0  . LORazepam (ATIVAN) 2 MG tablet Take 1 tablet (2 mg total) by mouth 2 (two) times daily.  60 tablet  0  . lubiprostone (AMITIZA) 24 MCG capsule Take 24 mcg by mouth 2 (two) times daily with a meal.      . PROVENTIL HFA 108 (90 BASE) MCG/ACT inhaler INHALE TWO PUFFS BY MOUTH EVERY 4 HOURS AS NEEDED FOR WHEEZING  7 each  0  .  simvastatin (ZOCOR) 20 MG tablet Take 1 tablet (20 mg total) by mouth every evening.  30 tablet  6  . Syringe, Disposable, 1 ML MISC Use one syringe for injection of testosterone.  25 each  3  . Tamsulosin HCl (FLOMAX) 0.4 MG CAPS Take 2 capsules (0.8 mg total) by mouth daily.  60 capsule  3  . testosterone cypionate (DEPOTESTOTERONE CYPIONATE) 200 MG/ML injection Inject 1 mL (200 mg total) into the muscle every 14 (fourteen) days.  10 mL  0  . tiotropium (SPIRIVA) 18 MCG inhalation capsule Place 1 capsule (18 mcg total) into inhaler and inhale daily.  30 capsule  12  . traZODone (DESYREL) 100 MG tablet Take 1 tablet (100 mg total) by mouth at bedtime.  30 tablet  6   No current facility-administered medications for this visit.    Exam:  BP 132/85  Pulse 65  Temp(Src) 98.3 F (36.8 C) (Oral)  Ht 5\' 11"  (1.803 m)  Wt 273 lb (123.832 kg)  BMI 38.09 kg/m2 Gen: Well NAD HEENT: EOMI,   MMM, L middle ear canal erythematous and w/ open mildly bloody lesions. TM nml, R ear cannal and TM nml  No results found for this or any previous visit (from the past 72 hour(s)).

## 2013-04-06 DIAGNOSIS — S0991XA Unspecified injury of ear, initial encounter: Secondary | ICD-10-CM | POA: Insufficient documentation

## 2013-04-06 NOTE — Assessment & Plan Note (Signed)
Likely from initial trauma w/ Qtip that worsened until bloody discharge.  Currently erythematous but may resolve w/o intervention Ciprodex sent to pharmacy Pt to fill if develops pain, swelling that worsens.

## 2013-04-06 NOTE — Assessment & Plan Note (Signed)
Well controlled. Continue therapy. 

## 2013-04-06 NOTE — Assessment & Plan Note (Signed)
Reviewed labs w/ pt.  Decrease dose back to 100mg  every other week Pt aware of risks and benefits and desires to continue w/ therapy

## 2013-04-14 ENCOUNTER — Other Ambulatory Visit: Payer: Self-pay | Admitting: Emergency Medicine

## 2013-04-22 ENCOUNTER — Other Ambulatory Visit: Payer: Self-pay | Admitting: *Deleted

## 2013-04-22 DIAGNOSIS — E785 Hyperlipidemia, unspecified: Secondary | ICD-10-CM

## 2013-04-23 MED ORDER — DICLOFENAC SODIUM 75 MG PO TBEC: DELAYED_RELEASE_TABLET | ORAL | Status: DC

## 2013-04-23 MED ORDER — BUTALBITAL-APAP-CAFFEINE 50-325-40 MG PO TABS: 1.0000 | ORAL_TABLET | Freq: Two times a day (BID) | ORAL | Status: DC | PRN

## 2013-04-23 MED ORDER — SIMVASTATIN 20 MG PO TABS: 20.0000 mg | ORAL_TABLET | Freq: Every evening | ORAL | Status: DC

## 2013-04-23 MED ORDER — ALBUTEROL SULFATE HFA 108 (90 BASE) MCG/ACT IN AERS: INHALATION_SPRAY | RESPIRATORY_TRACT | Status: DC

## 2013-04-23 MED ORDER — DOXAZOSIN MESYLATE 1 MG PO TABS: ORAL_TABLET | ORAL | Status: DC

## 2013-04-23 NOTE — Addendum Note (Signed)
Addended by: Ozella Rocks on: 04/23/2013 09:37 AM   Modules accepted: Orders

## 2013-04-24 ENCOUNTER — Telehealth: Payer: Self-pay | Admitting: Family Medicine

## 2013-04-24 MED ORDER — BUTALBITAL-APAP-CAFFEINE 50-325-40 MG PO TABS: 1.0000 | ORAL_TABLET | Freq: Two times a day (BID) | ORAL | Status: DC | PRN

## 2013-04-24 NOTE — Telephone Encounter (Signed)
Patient would like rx for fioricet 60 tabs to be fax to [facility], if possible. Patient only has one vehicle and fiance has it all [place] today. Call patient on cell [phone].

## 2013-04-24 NOTE — Telephone Encounter (Signed)
Called pt to inform him that his Rx for his fioricet was here and ready for pickup.  Printed out Rx for 10 tabs. Pt to call back if something is wron w/ Rx  Shelly Flatten, MD Family Medicine PGY-2 04/24/2013, 8:40 AM

## 2013-04-24 NOTE — Telephone Encounter (Signed)
fioricet phoned in. (60tabs 3 refills) Pt aware and will pick up

## 2013-05-04 ENCOUNTER — Other Ambulatory Visit: Payer: Self-pay | Admitting: Family Medicine

## 2013-05-04 DIAGNOSIS — F419 Anxiety disorder, unspecified: Secondary | ICD-10-CM

## 2013-05-08 ENCOUNTER — Other Ambulatory Visit: Payer: Self-pay | Admitting: *Deleted

## 2013-05-08 DIAGNOSIS — M25569 Pain in unspecified knee: Secondary | ICD-10-CM

## 2013-05-08 DIAGNOSIS — G8929 Other chronic pain: Secondary | ICD-10-CM

## 2013-05-08 DIAGNOSIS — F419 Anxiety disorder, unspecified: Secondary | ICD-10-CM

## 2013-05-08 NOTE — Telephone Encounter (Signed)
Will forward to another blue team md to refill medication.  [name],  [name] [name]

## 2013-05-08 NOTE — Telephone Encounter (Signed)
Pt is requesting a refill on Lorazepam 2 mg. He said that [facility] at [facility] has faxed [place] this for two weeks now including today. He would also like [place] to call him once we have approved this and have faxed [facility] since he needs a ride to go get this.JW

## 2013-05-09 ENCOUNTER — Telehealth: Payer: Self-pay | Admitting: Family Medicine

## 2013-05-09 MED ORDER — DICLOFENAC SODIUM 75 MG PO TBEC: DELAYED_RELEASE_TABLET | ORAL | Status: DC

## 2013-05-09 MED ORDER — LORAZEPAM 2 MG PO TABS: 2.0000 mg | ORAL_TABLET | Freq: Two times a day (BID) | ORAL | Status: DC

## 2013-05-09 NOTE — Telephone Encounter (Signed)
Pt is requesting his prescription for Ativan be faxed by 3 today

## 2013-05-09 NOTE — Telephone Encounter (Signed)
Please see previous phone note.  It is in the front office in to be faxed pile. It is my understanding that the faxes in that box will be faxced by front office today. Shameika Speelman, Maryjo Rochester

## 2013-05-09 NOTE — Telephone Encounter (Signed)
Filled Rx and placed in to be faxed. Patient wanted to be informed of this so will forward to nursing staff.

## 2013-05-09 NOTE — Telephone Encounter (Signed)
LMOVM informing pt that his Rx will be faxed later today. Fleeger, Maryjo Rochester

## 2013-05-09 NOTE — Telephone Encounter (Signed)
Already filled through other request.

## 2013-05-16 ENCOUNTER — Other Ambulatory Visit: Payer: Self-pay | Admitting: *Deleted

## 2013-05-16 ENCOUNTER — Ambulatory Visit (INDEPENDENT_AMBULATORY_CARE_PROVIDER_SITE_OTHER): Payer: Medicaid Other | Admitting: Family Medicine

## 2013-05-16 ENCOUNTER — Encounter: Payer: Self-pay | Admitting: Family Medicine

## 2013-05-16 VITALS — BP 159/84 | HR 77 | Temp 98.2°F | Ht 71.0 in | Wt 272.0 lb

## 2013-05-16 DIAGNOSIS — H938X9 Other specified disorders of ear, unspecified ear: Secondary | ICD-10-CM

## 2013-05-16 DIAGNOSIS — M25569 Pain in unspecified knee: Secondary | ICD-10-CM

## 2013-05-16 DIAGNOSIS — L989 Disorder of the skin and subcutaneous tissue, unspecified: Secondary | ICD-10-CM

## 2013-05-16 DIAGNOSIS — H938X3 Other specified disorders of ear, bilateral: Secondary | ICD-10-CM

## 2013-05-16 DIAGNOSIS — G8929 Other chronic pain: Secondary | ICD-10-CM

## 2013-05-16 DIAGNOSIS — R300 Dysuria: Secondary | ICD-10-CM

## 2013-05-16 DIAGNOSIS — G971 Other reaction to spinal and lumbar puncture: Secondary | ICD-10-CM

## 2013-05-16 DIAGNOSIS — L570 Actinic keratosis: Secondary | ICD-10-CM

## 2013-05-16 LAB — POCT URINALYSIS DIPSTICK
Bilirubin, UA: NEGATIVE
Glucose, UA: NEGATIVE
Nitrite, UA: NEGATIVE

## 2013-05-16 MED ORDER — ALBUTEROL SULFATE HFA 108 (90 BASE) MCG/ACT IN AERS: INHALATION_SPRAY | RESPIRATORY_TRACT | Status: DC

## 2013-05-16 MED ORDER — DICLOFENAC SODIUM 75 MG PO TBEC: DELAYED_RELEASE_TABLET | ORAL | Status: DC

## 2013-05-16 MED ORDER — FINASTERIDE 5 MG PO TABS: 5.0000 mg | ORAL_TABLET | Freq: Every day | ORAL | Status: DC

## 2013-05-16 MED ORDER — AMITRIPTYLINE HCL 50 MG PO TABS: ORAL_TABLET | ORAL | Status: DC

## 2013-05-16 MED ORDER — DICLOFENAC SODIUM 1 % TD GEL: 2.0000 g | Freq: Four times a day (QID) | TRANSDERMAL | Status: DC

## 2013-05-16 MED ORDER — GABAPENTIN 300 MG PO CAPS: 600.0000 mg | ORAL_CAPSULE | Freq: Four times a day (QID) | ORAL | Status: DC

## 2013-05-16 NOTE — Patient Instructions (Addendum)
Nice to meet you. We have used cold spray to freeze the lesion on your arm. 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 After freezing it will likely blister and become red. If this does not improve this please come back to see Dr. Konrad Dolores. For know we will try pseudoephedrine from behind the counter at the pharmacy. Try this to see if it helps clear your ears up.

## 2013-05-16 NOTE — Progress Notes (Signed)
  Subjective:    Patient ID: Ellijah Dinger, male    DOB: 08-06-1961, 52 y.o.   MRN: [id number]  HPI Patient is a 52 yo male who presents for complaint of spot on right fore arm and left chest and ear fullness.  Skin: right fore arm just noticed red scaly lesion. Mild itch. No pain. No bleeding. Left chest: been there 3 months, is brown, no itching, no pain, no bleeding. States has history of lots of sun exposure.  Ear fullness: first in left ear, then in right ear. States feels like full of water. Denies congestion. Has cough, but states has COPD. No dizziness. States has ringing in his ears, though this is not new. No decreased hearing.  Review of Systems see HPI     Objective:   Physical Exam  Constitutional: He appears well-developed and well-nourished.  HENT:  Head: Normocephalic and atraumatic.  Nose: Nose normal.  Mouth/Throat: Oropharynx is clear and moist.  Bilateral TM normal, no apparent fluid or bulging, normal light reflex Right ear canal with small scab.  Eyes: Conjunctivae are normal. Pupils are equal, round, and reactive to light.  Skin:  Right forearm: dorsal surface, small ~1 cm red scaly lesion Left chest: ~1.5 cm brown waxy raised lesion, stuck on appearance  BP 159/84  Pulse 77  Temp(Src) 98.2 F (36.8 C) (Oral)  Ht 5\' 11"  (1.803 m)  Wt 272 lb (123.378 kg)  BMI 37.95 kg/m2    Assessment & Plan:

## 2013-05-17 DIAGNOSIS — L989 Disorder of the skin and subcutaneous tissue, unspecified: Secondary | ICD-10-CM | POA: Insufficient documentation

## 2013-05-17 DIAGNOSIS — H938X9 Other specified disorders of ear, unspecified ear: Secondary | ICD-10-CM | POA: Insufficient documentation

## 2013-05-17 DIAGNOSIS — L570 Actinic keratosis: Secondary | ICD-10-CM | POA: Insufficient documentation

## 2013-05-17 NOTE — Assessment & Plan Note (Signed)
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 Will continue to monitor for further growth or change in lesion. May need to biopsy if this evolves.

## 2013-05-17 NOTE — Assessment & Plan Note (Signed)
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 Frozen in clinic. Will monitor for recurrence. Consider refreezing vs biopsy if continues to be an issue.

## 2013-05-17 NOTE — Assessment & Plan Note (Signed)
Patient with new onset ear fullness. No hearing loss per patient. No dizziness to indicate inner ear issue. Advised to try psuedoephedrine to see if congestion of eustachian tubes is the cause.

## 2013-05-20 ENCOUNTER — Other Ambulatory Visit: Payer: Self-pay | Admitting: *Deleted

## 2013-05-21 MED ORDER — AMITRIPTYLINE HCL 50 MG PO TABS: ORAL_TABLET | ORAL | Status: DC

## 2013-06-03 ENCOUNTER — Encounter: Payer: Self-pay | Admitting: Family Medicine

## 2013-06-04 ENCOUNTER — Other Ambulatory Visit: Payer: Self-pay | Admitting: *Deleted

## 2013-06-04 ENCOUNTER — Other Ambulatory Visit: Payer: Self-pay | Admitting: Family Medicine

## 2013-06-04 DIAGNOSIS — F419 Anxiety disorder, unspecified: Secondary | ICD-10-CM

## 2013-06-04 MED ORDER — FINASTERIDE 5 MG PO TABS: 5.0000 mg | ORAL_TABLET | Freq: Every day | ORAL | Status: DC

## 2013-06-04 MED ORDER — LORAZEPAM 2 MG PO TABS: 2.0000 mg | ORAL_TABLET | Freq: Two times a day (BID) | ORAL | Status: DC

## 2013-06-13 ENCOUNTER — Ambulatory Visit: Payer: Medicaid Other | Admitting: Family Medicine

## 2013-06-14 ENCOUNTER — Other Ambulatory Visit: Payer: Self-pay | Admitting: Family Medicine

## 2013-06-17 ENCOUNTER — Other Ambulatory Visit (HOSPITAL_COMMUNITY)
Admission: RE | Admit: 2013-06-17 | Discharge: 2013-06-17 | Disposition: A | Discharge status: Home / Self Care | Payer: Medicaid Other | Source: Ambulatory Visit | Attending: Family Medicine | Admitting: Family Medicine

## 2013-06-17 ENCOUNTER — Encounter: Payer: Self-pay | Admitting: Family Medicine

## 2013-06-17 ENCOUNTER — Ambulatory Visit (INDEPENDENT_AMBULATORY_CARE_PROVIDER_SITE_OTHER): Payer: Medicaid Other | Admitting: Family Medicine

## 2013-06-17 VITALS — BP 139/81 | HR 81 | Temp 98.9°F | Ht 71.0 in | Wt 268.0 lb

## 2013-06-17 DIAGNOSIS — Z720 Tobacco use: Secondary | ICD-10-CM

## 2013-06-17 DIAGNOSIS — F17200 Nicotine dependence, unspecified, uncomplicated: Secondary | ICD-10-CM

## 2013-06-17 DIAGNOSIS — L989 Disorder of the skin and subcutaneous tissue, unspecified: Secondary | ICD-10-CM

## 2013-06-17 DIAGNOSIS — R6882 Decreased libido: Secondary | ICD-10-CM

## 2013-06-17 DIAGNOSIS — I10 Essential (primary) hypertension: Secondary | ICD-10-CM

## 2013-06-17 DIAGNOSIS — N4889 Other specified disorders of penis: Secondary | ICD-10-CM

## 2013-06-17 DIAGNOSIS — G971 Other reaction to spinal and lumbar puncture: Secondary | ICD-10-CM

## 2013-06-17 DIAGNOSIS — F329 Major depressive disorder, single episode, unspecified: Secondary | ICD-10-CM

## 2013-06-17 DIAGNOSIS — F32A Depression, unspecified: Secondary | ICD-10-CM

## 2013-06-17 DIAGNOSIS — F3289 Other specified depressive episodes: Secondary | ICD-10-CM

## 2013-06-17 DIAGNOSIS — F411 Generalized anxiety disorder: Secondary | ICD-10-CM

## 2013-06-17 DIAGNOSIS — F419 Anxiety disorder, unspecified: Secondary | ICD-10-CM

## 2013-06-17 DIAGNOSIS — Z113 Encounter for screening for infections with a predominantly sexual mode of transmission: Secondary | ICD-10-CM | POA: Insufficient documentation

## 2013-06-17 LAB — POCT URINALYSIS DIPSTICK
Bilirubin, UA: NEGATIVE
Blood, UA: NEGATIVE
Ketones, UA: NEGATIVE
Spec Grav, UA: 1.01
pH, UA: 5.5

## 2013-06-17 LAB — CBC
HCT: 47.2 % (ref 39.0–52.0)
Hemoglobin: 16.2 g/dL (ref 13.0–17.0)
MCH: 32.3 pg (ref 26.0–34.0)
MCHC: 34.3 g/dL (ref 30.0–36.0)
MCV: 94 fL (ref 78.0–100.0)

## 2013-06-17 MED ORDER — DICLOFENAC SODIUM 1 % TD GEL: 2.0000 g | Freq: Four times a day (QID) | TRANSDERMAL | Status: DC

## 2013-06-17 MED ORDER — CYCLOBENZAPRINE HCL 5 MG PO TABS: 5.0000 mg | ORAL_TABLET | Freq: Three times a day (TID) | ORAL | Status: DC | PRN

## 2013-06-17 MED ORDER — BUTALBITAL-APAP-CAFFEINE 50-325-40 MG PO TABS: 1.0000 | ORAL_TABLET | Freq: Two times a day (BID) | ORAL | Status: DC | PRN

## 2013-06-17 MED ORDER — LORAZEPAM 2 MG PO TABS: 2.0000 mg | ORAL_TABLET | Freq: Two times a day (BID) | ORAL | Status: DC

## 2013-06-17 MED ORDER — AMITRIPTYLINE HCL 50 MG PO TABS: ORAL_TABLET | ORAL | Status: DC

## 2013-06-17 MED ORDER — DICLOFENAC SODIUM 75 MG PO TBEC: DELAYED_RELEASE_TABLET | ORAL | Status: DC

## 2013-06-17 MED ORDER — PHENAZOPYRIDINE HCL 100 MG PO TABS: 100.0000 mg | ORAL_TABLET | Freq: Three times a day (TID) | ORAL | Status: DC | PRN

## 2013-06-17 MED ORDER — TRAZODONE HCL 100 MG PO TABS: 100.0000 mg | ORAL_TABLET | Freq: Every day | ORAL | Status: DC

## 2013-06-17 MED ORDER — FLUTICASONE PROPIONATE 50 MCG/ACT NA SUSP: NASAL | Status: DC

## 2013-06-17 MED ORDER — ALBUTEROL SULFATE HFA 108 (90 BASE) MCG/ACT IN AERS: INHALATION_SPRAY | RESPIRATORY_TRACT | Status: DC

## 2013-06-17 MED ORDER — LISINOPRIL 20 MG PO TABS: 20.0000 mg | ORAL_TABLET | Freq: Every day | ORAL | Status: DC

## 2013-06-17 MED ORDER — HYDROCHLOROTHIAZIDE 25 MG PO TABS: 25.0000 mg | ORAL_TABLET | Freq: Every day | ORAL | Status: DC

## 2013-06-17 NOTE — Assessment & Plan Note (Addendum)
Full STD workup as had unprotected intercourse x1 w/ male partner.  GC/Chl, RPR, HIV, CBC UA for other possible causes Pyridium for symptoms

## 2013-06-17 NOTE — Progress Notes (Signed)
[name] [name] is a 52 y.o. male who presents to [facility] today for f/u.   Unprotected intercourse w/ new male partner. Burning w/ urination about 2 wks after that. No penile discharge. No lumps in the groin area. Denies fevers rash or open sores.  Testosterone: 100mg  Q2wks. More energy, wt loss. Feeling much betterm, , but no change in sexual drive.  Arm sore: L arm. 7 days ago. Unsure of what caused the injury. No purulent discharge. Open sore but now improving but experiencing elbow pain. No tick or othe insect bite and no known animal bite. Used rubbing alcohol and ABX ointment.   Tobacco 1ppd. Using e cig.   The following portions of the patient's history were reviewed and updated as appropriate: allergies, current medications, past medical history, family and social history, and problem list.  Patient is a 1ppd smoker  Past Medical History  Diagnosis Date  . Constipation due to pain medication   . COPD (chronic obstructive pulmonary disease)   . Status post spinal surgery   . Hypertension   . Anxiety     ROS as above otherwise neg.    Medications reviewed. Current Outpatient Prescriptions  Medication Sig Dispense Refill  . amitriptyline (ELAVIL) 50 MG tablet TAKE TWO TABLETS BY MOUTH TWICE DAILY  60 tablet  0  . bisacodyl (DULCOLAX) 5 MG EC tablet Take 5 mg by mouth daily as needed.      . butalbital-acetaminophen-caffeine (FIORICET, ESGIC) 50-325-40 MG per tablet Take 1 tablet by mouth 2 (two) times daily as needed for headache.  60 tablet  3  . ciprofloxacin-dexamethasone (CIPRODEX) otic suspension Place 4 drops into the left ear 2 (two) times daily. Treat for 5 days  7.5 mL  0  . cyclobenzaprine (FLEXERIL) 5 MG tablet TAKE ONE TABLET BY MOUTH THREE TIMES DAILY AS NEEDED FOR MUSCLE SPASM  60 tablet  0  . cyclobenzaprine (FLEXERIL) 5 MG tablet Take 1 tablet (5 mg total) by mouth 3 (three) times daily as needed for muscle spasms.  90 tablet  3  . diclofenac (VOLTAREN) 75 MG EC tablet  TAKE ONE TABLET BY MOUTH TWICE DAILY  30 tablet  0  . diclofenac sodium (VOLTAREN) 1 % GEL Apply 2 g topically 4 (four) times daily. As needed for pain  100 g  2  . doxazosin (CARDURA) 1 MG tablet TAKE ONE TABLET BY MOUTH AT BEDTIME  30 tablet  0  . finasteride (PROSCAR) 5 MG tablet Take 1 tablet (5 mg total) by mouth daily.  30 tablet  3  . fluticasone (FLONASE) 50 MCG/ACT nasal spray USE TWO SPRAY IN EACH NOSTRIL TWICE DAILY AS NEEDED FOR  RHINITIS,  STOP  IF  YOU  DEVELOP  A  NOSE  BLEED  16 g  1  . gabapentin (NEURONTIN) 300 MG capsule Take 2 capsules (600 mg total) by mouth 4 (four) times daily.  240 capsule  3  . hydrochlorothiazide (HYDRODIURIL) 25 MG tablet Take 1 tablet (25 mg total) by mouth daily.  30 tablet  3  . lisinopril (PRINIVIL,ZESTRIL) 20 MG tablet Take 1 tablet (20 mg total) by mouth daily.  90 tablet  3  . loratadine (CLARITIN) 10 MG tablet Take 1 tablet (10 mg total) by mouth daily.  30 tablet  11  . LORazepam (ATIVAN) 2 MG tablet Take 1 tablet (2 mg total) by mouth 2 (two) times daily.  60 tablet  0  . LORazepam (ATIVAN) 2 MG tablet Take 1 tablet (2  mg total) by mouth 2 (two) times daily.  60 tablet  0  . lubiprostone (AMITIZA) 24 MCG capsule Take 24 mcg by mouth 2 (two) times daily with a meal.      . PROVENTIL HFA 108 (90 BASE) MCG/ACT inhaler INHALE TWO PUFFS BY MOUTH INTO LUNGS EVERY 4 HOURS AS NEEDED FOR WHEEZING  7 each  0  . simvastatin (ZOCOR) 20 MG tablet Take 1 tablet (20 mg total) by mouth every evening.  30 tablet  6  . Syringe, Disposable, 1 ML MISC Use one syringe for injection of testosterone.  25 each  3  . Tamsulosin HCl (FLOMAX) 0.4 MG CAPS Take 2 capsules (0.8 mg total) by mouth daily.  60 capsule  3  . testosterone cypionate (DEPOTESTOTERONE CYPIONATE) 200 MG/ML injection Inject 0.5 mLs (100 mg total) into the muscle every 14 (fourteen) days.  10 mL  0  . tiotropium (SPIRIVA) 18 MCG inhalation capsule Place 1 capsule (18 mcg total) into inhaler and inhale  daily.  30 capsule  12  . traZODone (DESYREL) 100 MG tablet Take 1 tablet (100 mg total) by mouth at bedtime.  30 tablet  6   No current facility-administered medications for this visit.    Exam: BP 139/81  Pulse 81  Temp(Src) 98.9 F (37.2 C) (Oral)  Ht 5\' 11"  (1.803 m)  Wt 268 lb (121.564 kg)  BMI 37.39 kg/m2 Gen: Well NAD HEENT: EOMI,  MMM L arm w/ antecubital fossa pink lesion w/ center ulceration that is healing. No purulent discharge. Non-painfult to palpation GU: no inguinal lymphnodes palpated, no scrotal or penil tenderness or masses. Skin w/o lesions. No penile discharge or overt erythema.    No results found for this or any previous visit (from the past 72 hour(s)).

## 2013-06-17 NOTE — Assessment & Plan Note (Signed)
At goal Cont current regimen  

## 2013-06-17 NOTE — Assessment & Plan Note (Signed)
Cont testosterone for secondary benefits (exercise wt loss and general improved mood) Recheck testosterone end of November

## 2013-06-17 NOTE — Assessment & Plan Note (Addendum)
Likely to resolve w/ conservative therapy. If becomes infected pt to call for ABX for possible cellulitis Healing lesion likely secondary to trauma while working outside vs insect bite that was irritated from pt. Stop using rubbing alcohol Cont using triple ABX oint Precautions givena dn all questions answered

## 2013-06-17 NOTE — Patient Instructions (Addendum)
THank you for coming in today. I will call you with your results if they are positive. They should be back in about 2 days Please start taking pyridium for the penile pain. Please drink lots of fluids Please continue to use the antibiotic ointment on the arm but not the alcohol Please come back if you have any further concerns If you arm becomes more  Painful and red or with pussy discharge call me and I'll get you antibiotics Come back the end ov November for your testosterone check

## 2013-06-17 NOTE — Assessment & Plan Note (Signed)
Continues to try to cut back

## 2013-06-18 ENCOUNTER — Encounter: Payer: Self-pay | Admitting: Family Medicine

## 2013-06-18 LAB — RPR

## 2013-06-18 LAB — HIV ANTIBODY (ROUTINE TESTING W REFLEX): HIV: NONREACTIVE

## 2013-06-19 ENCOUNTER — Telehealth: Payer: Self-pay | Admitting: Family Medicine

## 2013-06-19 NOTE — Telephone Encounter (Signed)
Pt called and informed of nml lab results  Shelly Flatten, MD Family Medicine PGY-3 06/19/2013, 3:17 PM

## 2013-06-20 ENCOUNTER — Encounter: Payer: Self-pay | Admitting: Family Medicine

## 2013-06-22 ENCOUNTER — Inpatient Hospital Stay (HOSPITAL_COMMUNITY)
Admission: EM | Admit: 2013-06-22 | Discharge: 2013-06-25 | DRG: 917 | Disposition: A | Discharge status: Home / Self Care | Payer: Medicaid Other | Attending: Internal Medicine | Admitting: Internal Medicine

## 2013-06-22 ENCOUNTER — Emergency Department (HOSPITAL_COMMUNITY): Payer: Medicaid Other

## 2013-06-22 ENCOUNTER — Encounter (HOSPITAL_COMMUNITY): Payer: Self-pay | Admitting: *Deleted

## 2013-06-22 DIAGNOSIS — Z720 Tobacco use: Secondary | ICD-10-CM

## 2013-06-22 DIAGNOSIS — E785 Hyperlipidemia, unspecified: Secondary | ICD-10-CM

## 2013-06-22 DIAGNOSIS — Z79899 Other long term (current) drug therapy: Secondary | ICD-10-CM

## 2013-06-22 DIAGNOSIS — L570 Actinic keratosis: Secondary | ICD-10-CM

## 2013-06-22 DIAGNOSIS — J449 Chronic obstructive pulmonary disease, unspecified: Secondary | ICD-10-CM

## 2013-06-22 DIAGNOSIS — K5909 Other constipation: Secondary | ICD-10-CM | POA: Diagnosis present

## 2013-06-22 DIAGNOSIS — F17200 Nicotine dependence, unspecified, uncomplicated: Secondary | ICD-10-CM | POA: Diagnosis present

## 2013-06-22 DIAGNOSIS — G92 Toxic encephalopathy: Secondary | ICD-10-CM | POA: Diagnosis present

## 2013-06-22 DIAGNOSIS — F32A Depression, unspecified: Secondary | ICD-10-CM

## 2013-06-22 DIAGNOSIS — T481X4A Poisoning by skeletal muscle relaxants [neuromuscular blocking agents], undetermined, initial encounter: Principal | ICD-10-CM | POA: Diagnosis present

## 2013-06-22 DIAGNOSIS — J9601 Acute respiratory failure with hypoxia: Secondary | ICD-10-CM | POA: Diagnosis present

## 2013-06-22 DIAGNOSIS — J4489 Other specified chronic obstructive pulmonary disease: Secondary | ICD-10-CM | POA: Diagnosis present

## 2013-06-22 DIAGNOSIS — R6882 Decreased libido: Secondary | ICD-10-CM

## 2013-06-22 DIAGNOSIS — G8929 Other chronic pain: Secondary | ICD-10-CM

## 2013-06-22 DIAGNOSIS — M792 Neuralgia and neuritis, unspecified: Secondary | ICD-10-CM

## 2013-06-22 DIAGNOSIS — N139 Obstructive and reflux uropathy, unspecified: Secondary | ICD-10-CM

## 2013-06-22 DIAGNOSIS — G929 Unspecified toxic encephalopathy: Secondary | ICD-10-CM | POA: Diagnosis present

## 2013-06-22 DIAGNOSIS — T50901A Poisoning by unspecified drugs, medicaments and biological substances, accidental (unintentional), initial encounter: Secondary | ICD-10-CM

## 2013-06-22 DIAGNOSIS — E669 Obesity, unspecified: Secondary | ICD-10-CM

## 2013-06-22 DIAGNOSIS — F411 Generalized anxiety disorder: Secondary | ICD-10-CM | POA: Diagnosis present

## 2013-06-22 DIAGNOSIS — T1491XA Suicide attempt, initial encounter: Secondary | ICD-10-CM

## 2013-06-22 DIAGNOSIS — J9600 Acute respiratory failure, unspecified whether with hypoxia or hypercapnia: Secondary | ICD-10-CM | POA: Diagnosis present

## 2013-06-22 DIAGNOSIS — L989 Disorder of the skin and subcutaneous tissue, unspecified: Secondary | ICD-10-CM

## 2013-06-22 DIAGNOSIS — N4889 Other specified disorders of penis: Secondary | ICD-10-CM

## 2013-06-22 DIAGNOSIS — G894 Chronic pain syndrome: Secondary | ICD-10-CM | POA: Diagnosis present

## 2013-06-22 DIAGNOSIS — F419 Anxiety disorder, unspecified: Secondary | ICD-10-CM

## 2013-06-22 DIAGNOSIS — R600 Localized edema: Secondary | ICD-10-CM

## 2013-06-22 DIAGNOSIS — I10 Essential (primary) hypertension: Secondary | ICD-10-CM | POA: Diagnosis present

## 2013-06-22 DIAGNOSIS — F329 Major depressive disorder, single episode, unspecified: Secondary | ICD-10-CM | POA: Diagnosis present

## 2013-06-22 DIAGNOSIS — T50992A Poisoning by other drugs, medicaments and biological substances, intentional self-harm, initial encounter: Secondary | ICD-10-CM | POA: Diagnosis present

## 2013-06-22 DIAGNOSIS — T50995A Adverse effect of other drugs, medicaments and biological substances, initial encounter: Secondary | ICD-10-CM | POA: Diagnosis present

## 2013-06-22 LAB — RAPID URINE DRUG SCREEN, HOSP PERFORMED
Amphetamines: NOT DETECTED
Barbiturates: POSITIVE — AB
Tetrahydrocannabinol: NOT DETECTED

## 2013-06-22 LAB — URINE MICROSCOPIC-ADD ON

## 2013-06-22 LAB — URINALYSIS, ROUTINE W REFLEX MICROSCOPIC
Bilirubin Urine: NEGATIVE
Glucose, UA: NEGATIVE mg/dL
Ketones, ur: NEGATIVE mg/dL
Protein, ur: NEGATIVE mg/dL

## 2013-06-22 LAB — COMPREHENSIVE METABOLIC PANEL
ALT: 17 U/L (ref 0–53)
AST: 14 U/L (ref 0–37)
Albumin: 3.8 g/dL (ref 3.5–5.2)
CO2: 25 mEq/L (ref 19–32)
Chloride: 108 mEq/L (ref 96–112)
GFR calc non Af Amer: >90 mL/min (ref >90)
Sodium: 143 mEq/L (ref 135–145)
Total Bilirubin: 0.3 mg/dL (ref 0.3–1.2)

## 2013-06-22 LAB — CBC
Platelets: 218 10*3/uL (ref 150–400)
RBC: 5.17 MIL/uL (ref 4.22–5.81)
WBC: 11.6 10*3/uL — ABNORMAL HIGH (ref 4.0–10.5)

## 2013-06-22 LAB — TROPONIN I: Troponin I: <0.3 ng/mL (ref <0.30)

## 2013-06-22 MED ORDER — LIDOCAINE HCL (CARDIAC) 20 MG/ML IV SOLN
INTRAVENOUS | Status: AC
  Filled 2013-06-22: qty 5

## 2013-06-22 MED ORDER — ACETAMINOPHEN 500 MG PO TABS: 1000.0000 mg | ORAL_TABLET | Freq: Once | ORAL | Status: DC

## 2013-06-22 MED ORDER — ETOMIDATE 2 MG/ML IV SOLN
INTRAVENOUS | Status: AC
  Administered 2013-06-22: 20 mg
  Filled 2013-06-22: qty 20

## 2013-06-22 MED ORDER — MIDAZOLAM HCL 2 MG/2ML IJ SOLN
4.0000 mg | Freq: Once | INTRAMUSCULAR | Status: AC
  Administered 2013-06-22: 4 mg via INTRAVENOUS
  Filled 2013-06-22: qty 4

## 2013-06-22 MED ORDER — ROCURONIUM BROMIDE 50 MG/5ML IV SOLN
INTRAVENOUS | Status: AC
  Administered 2013-06-23: 50 mg
  Filled 2013-06-22: qty 2

## 2013-06-22 MED ORDER — SUCCINYLCHOLINE CHLORIDE 20 MG/ML IJ SOLN
INTRAMUSCULAR | Status: AC
  Administered 2013-06-22: 150 mg
  Filled 2013-06-22: qty 1

## 2013-06-22 MED ORDER — SODIUM CHLORIDE 0.9 % IV BOLUS (SEPSIS)
1000.0000 mL | Freq: Once | INTRAVENOUS | Status: AC
Start: 2013-06-22 — End: 2013-06-23
  Administered 2013-06-22: 1000 mL via INTRAVENOUS

## 2013-06-22 MED ORDER — SUCCINYLCHOLINE CHLORIDE 20 MG/ML IJ SOLN
50.0000 mg | Freq: Once | INTRAMUSCULAR | Status: AC
  Administered 2013-06-22: 50 mg via INTRAVENOUS

## 2013-06-22 MED ORDER — PROPOFOL 10 MG/ML IV EMUL
5.0000 ug/kg/min | INTRAVENOUS | Status: DC
  Administered 2013-06-23: 30 ug/kg/min via INTRAVENOUS
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
## 2013-06-22 NOTE — ED Notes (Signed)
Onalee Hua at [facility] called and given update.

## 2013-06-22 NOTE — ED Notes (Signed)
Pt took unknown amount of cyclobenzaprine 5mg  tablets. Pt unresponsive to verbal stimulation. Pt is responsive painful stimuli.

## 2013-06-22 NOTE — ED Notes (Signed)
Pt has sudden jerking movements of limbs, snoring respirations, tachy on monitor at 132, RR 30
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
## 2013-06-22 NOTE — ED Provider Notes (Signed)
Scribed for [name] Hutching, MD, the patient was seen in room APA02/APA02. This chart was scribed by Lewanda Rife, ED scribe. Patient's care was started at 2203  CSN: [id number]     Arrival date & time 06/22/13  2127 History     First MD Initiated Contact with Patient 06/22/13 2157     Chief Complaint  Patient presents with  . Drug Overdose   (Consider location/radiation/quality/duration/timing/severity/associated sxs/prior Treatment) Level 5 caveat applies due to altered mental status. The history is provided by medical records and the EMS personnel (ex-girlfriend).  HPI Comments: [name] [name] is a 52 y.o. male brought in by EMS who presents to the Emergency Department complaining of drug overdose onset PTA. Ex-girlfriend reports they got into an argument before he took the pills. Reports taking unknown amount of Flexeril 5 mg and possibly Neurontin.  Patient was initially ambulatory at his home, but he was obtunded in emergency department.  No previous suicide attempt  Past Medical History  Diagnosis Date  . Constipation due to pain medication   . COPD (chronic obstructive pulmonary disease)   . Status post spinal surgery   . Hypertension   . Anxiety    Past Surgical History  Procedure Laterality Date  . Spinal fixation surgery     Family History  Problem Relation Age of Onset  . Cancer Neg Hx    History  Substance Use Topics  . Smoking status: Current Every [place] Smoker -- 1.00 packs/[place]    Types: Cigarettes  . Smokeless tobacco: Not on file     Comment: decreased to 1ppd  . Alcohol Use: Not on file    Review of Systems  Unable to perform ROS: Mental status change    Allergies  Fentanyl and Methadone  Home Medications   Current Outpatient Rx  Name  Route  Sig  Dispense  Refill  . albuterol (PROVENTIL HFA) 108 (90 BASE) MCG/ACT inhaler      INHALE TWO PUFFS BY MOUTH INTO LUNGS EVERY 4 HOURS AS NEEDED FOR WHEEZING   7 each   0   . amitriptyline (ELAVIL) 50 MG  tablet      TAKE TWO TABLETS BY MOUTH TWICE DAILY   60 tablet   6   . bisacodyl (DULCOLAX) 5 MG EC tablet   Oral   Take 5 mg by mouth daily as needed.         . butalbital-acetaminophen-caffeine (FIORICET, ESGIC) 50-325-40 MG per tablet   Oral   Take 1 tablet by mouth 2 (two) times daily as needed for headache.   60 tablet   3     Needs to see PCP prior to additional refills.   . ciprofloxacin-dexamethasone (CIPRODEX) otic suspension   Left Ear   Place 4 drops into the left ear 2 (two) times daily. Treat for 5 days   7.5 mL   0   . cyclobenzaprine (FLEXERIL) 5 MG tablet      TAKE ONE TABLET BY MOUTH THREE TIMES DAILY AS NEEDED FOR MUSCLE SPASM   60 tablet   0   . cyclobenzaprine (FLEXERIL) 5 MG tablet   Oral   Take 1 tablet (5 mg total) by mouth 3 (three) times daily as needed for muscle spasms.   90 tablet   3   . diclofenac (VOLTAREN) 75 MG EC tablet      TAKE ONE TABLET BY MOUTH TWICE DAILY. As needed for pain   30 tablet   3   . diclofenac sodium (  VOLTAREN) 1 % GEL   Topical   Apply 2 g topically 4 (four) times daily. As needed for pain   100 g   2   . doxazosin (CARDURA) 1 MG tablet      TAKE ONE TABLET BY MOUTH AT BEDTIME   30 tablet   0   . finasteride (PROSCAR) 5 MG tablet   Oral   Take 1 tablet (5 mg total) by mouth daily.   30 tablet   3   . fluticasone (FLONASE) 50 MCG/ACT nasal spray      USE TWO SPRAY IN EACH NOSTRIL TWICE DAILY AS NEEDED FOR  RHINITIS,  STOP  IF  YOU  DEVELOP  A  NOSE  BLEED   16 g   1   . gabapentin (NEURONTIN) 300 MG capsule   Oral   Take 2 capsules (600 mg total) by mouth 4 (four) times daily.   240 capsule   3   . hydrochlorothiazide (HYDRODIURIL) 25 MG tablet   Oral   Take 1 tablet (25 mg total) by mouth daily.   30 tablet   3   . lisinopril (PRINIVIL,ZESTRIL) 20 MG tablet   Oral   Take 1 tablet (20 mg total) by mouth daily.   90 tablet   3   . EXPIRED: loratadine (CLARITIN) 10 MG tablet    Oral   Take 1 tablet (10 mg total) by mouth daily.   30 tablet   11   . EXPIRED: LORazepam (ATIVAN) 2 MG tablet   Oral   Take 1 tablet (2 mg total) by mouth 2 (two) times daily.   60 tablet   0   . LORazepam (ATIVAN) 2 MG tablet   Oral   Take 1 tablet (2 mg total) by mouth 2 (two) times daily.   60 tablet   0   . lubiprostone (AMITIZA) 24 MCG capsule   Oral   Take 24 mcg by mouth 2 (two) times daily with a meal.         . phenazopyridine (PYRIDIUM) 100 MG tablet   Oral   Take 1 tablet (100 mg total) by mouth 3 (three) times daily as needed for pain.   30 tablet   1   . simvastatin (ZOCOR) 20 MG tablet   Oral   Take 1 tablet (20 mg total) by mouth every evening.   30 tablet   6   . Syringe, Disposable, 1 ML MISC      Use one syringe for injection of testosterone.   25 each   3     Product variability permitted per insurance and pt ...   . Tamsulosin HCl (FLOMAX) 0.4 MG CAPS   Oral   Take 2 capsules (0.8 mg total) by mouth daily.   60 capsule   3   . testosterone cypionate (DEPOTESTOTERONE CYPIONATE) 200 MG/ML injection   Intramuscular   Inject 0.5 mLs (100 mg total) into the muscle every 14 (fourteen) days.   10 mL   0   . tiotropium (SPIRIVA) 18 MCG inhalation capsule   Inhalation   Place 1 capsule (18 mcg total) into inhaler and inhale daily.   30 capsule   12   . traZODone (DESYREL) 100 MG tablet   Oral   Take 1 tablet (100 mg total) by mouth at bedtime.   30 tablet   6    BP 145/88  Pulse 138  Resp 31  Ht 6' (1.829 m)  Wt 270  lb (122.471 kg)  BMI 36.61 kg/m2  SpO2 95% Physical Exam  Nursing note and vitals reviewed. Constitutional: He is oriented to person, place, and time.  Obese, sonorous respirations, responds only to tactile deep pain stimulation  HENT:  Head: Normocephalic and atraumatic.  Eyes: Conjunctivae and EOM are normal. Pupils are equal, round, and reactive to light.  Neck: Normal range of motion. Neck supple.   Cardiovascular: Normal rate, regular rhythm and normal heart sounds.   Pulmonary/Chest: Effort normal and breath sounds normal.  Abdominal: Soft. Bowel sounds are normal.  Musculoskeletal: Normal range of motion.  Neurological: He is alert and oriented to person, place, and time.  Responding to painful, tactile stimuli, but not to verbal stimuli. Obtunded   Skin: Skin is warm and dry.     Psychiatric: He has a normal mood and affect.    ED Course   Procedures (including critical care time) 2210 Poison control was called on arrival Medications  sodium chloride 0.9 % bolus 1,000 mL (1,000 mL Intravenous New Bag/Given 06/22/13 2222)    Labs Reviewed  URINALYSIS, ROUTINE W REFLEX MICROSCOPIC - Abnormal; Notable for the following:    Specific Gravity, Urine <1.005 (*)    Hgb urine dipstick LARGE (*)    Nitrite POSITIVE (*)    All other components within normal limits  URINE RAPID DRUG SCREEN (HOSP PERFORMED) - Abnormal; Notable for the following:    Barbiturates POSITIVE (*)    All other components within normal limits  CBC - Abnormal; Notable for the following:    WBC 11.6 (*)    All other components within normal limits  COMPREHENSIVE METABOLIC PANEL - Abnormal; Notable for the following:    BUN 5 (*)    All other components within normal limits  SALICYLATE LEVEL - Abnormal; Notable for the following:    Salicylate Lvl 0.0 (*)    All other components within normal limits  URINE MICROSCOPIC-ADD ON - Abnormal; Notable for the following:    Bacteria, UA FEW (*)    All other components within normal limits  ETHANOL  ACETAMINOPHEN LEVEL  TROPONIN I  BLOOD GAS, ARTERIAL     No results found. No results found. No diagnosis found.  Date: 06/22/2013  Rate: 137  Rhythm: sinus tachy  QRS Axis: normal  Intervals: normal  ST/T Wave abnormalities: normal  Conduction Disutrbances: none  Narrative Interpretation: unremarkable  CRITICAL CARE Performed by: [name] [name] Total  critical care time: 45 Critical care time was exclusive of separately billable procedures and treating other patients. Critical care was necessary to treat or prevent imminent or life-threatening deterioration. Critical care was time spent personally by me on the following activities: development of treatment plan with patient and/or surrogate as well as nursing, discussions with consultants, evaluation of patient's response to treatment, examination of patient, obtaining history from patient or surrogate, ordering and performing treatments and interventions, ordering and review of laboratory studies, ordering and review of radiographic studies, pulse oximetry and re-evaluation of patient's condition.   MDM  Patient took a large amount of Flexeril 5 mg tablets.  He was obtunded in emergency department. Patient intubated by Dr Fleet Contras. Vital signs are stable.  Patient transferred to [facility] critical care Dr. Rory Percy    I personally performed the services described in this documentation, which was scribed in my presence. The recorded information has been reviewed and is accurate.    [name] Hutching, MD 06/23/13 0002

## 2013-06-22 NOTE — ED Notes (Signed)
Spoke with Onalee Hua at [facility] regarding ingestion of unknown amount of Flexeril.  Onalee Hua stated to have cardiac monitoring with EKG as our actions for ingestion.  Onalee Hua states to watch for sedation, CNS depression including respiratory failure, tachycardia, hypertension, involuntary muscle tremors and possible prolonged QT wave.

## 2013-06-22 NOTE — ED Notes (Signed)
Pt's brother Dorinda Hill Michelini called and left a contact number [phone]

## 2013-06-23 ENCOUNTER — Inpatient Hospital Stay (HOSPITAL_COMMUNITY): Payer: Medicaid Other

## 2013-06-23 DIAGNOSIS — J9600 Acute respiratory failure, unspecified whether with hypoxia or hypercapnia: Secondary | ICD-10-CM

## 2013-06-23 DIAGNOSIS — T50901A Poisoning by unspecified drugs, medicaments and biological substances, accidental (unintentional), initial encounter: Secondary | ICD-10-CM

## 2013-06-23 DIAGNOSIS — J9601 Acute respiratory failure with hypoxia: Secondary | ICD-10-CM | POA: Diagnosis present

## 2013-06-23 LAB — BLOOD GAS, ARTERIAL
Bicarbonate: 21.7 mEq/L (ref 20.0–24.0)
Bicarbonate: 20.9 mEq/L (ref 20.0–24.0)
Drawn by: 36496
MECHVT: 600 mL
O2 Saturation: 99.6 %
PEEP: 5 cmH2O
Patient temperature: 98.6
RATE: 20 resp/min
TCO2: 18.9 mmol/L (ref 0–100)
pH, Arterial: 7.299 — ABNORMAL LOW (ref 7.350–7.450)
pO2, Arterial: 351 mmHg — ABNORMAL HIGH (ref 80.0–100.0)

## 2013-06-23 LAB — URINALYSIS, ROUTINE W REFLEX MICROSCOPIC
Nitrite: POSITIVE — AB
Specific Gravity, Urine: 1.018 (ref 1.005–1.030)
Urobilinogen, UA: 1 mg/dL (ref 0.0–1.0)

## 2013-06-23 LAB — BASIC METABOLIC PANEL
BUN: 6 mg/dL (ref 6–23)
Calcium: 8.3 mg/dL — ABNORMAL LOW (ref 8.4–10.5)
GFR calc Af Amer: >90 mL/min (ref >90)
GFR calc non Af Amer: >90 mL/min (ref >90)
Glucose, Bld: 93 mg/dL (ref 70–99)
Sodium: 143 mEq/L (ref 135–145)

## 2013-06-23 LAB — CBC
Hemoglobin: 16.4 g/dL (ref 13.0–17.0)
MCH: 33.3 pg (ref 26.0–34.0)
MCHC: 35.3 g/dL (ref 30.0–36.0)
RDW: 14 % (ref 11.5–15.5)

## 2013-06-23 LAB — MRSA PCR SCREENING: MRSA by PCR: NEGATIVE

## 2013-06-23 LAB — URINE MICROSCOPIC-ADD ON

## 2013-06-23 MED ORDER — SODIUM CHLORIDE 0.9 % IV SOLN: 250.0000 mL | INTRAVENOUS | Status: DC | PRN

## 2013-06-23 MED ORDER — PROPOFOL 10 MG/ML IV EMUL
INTRAVENOUS | Status: AC
  Filled 2013-06-23: qty 100

## 2013-06-23 MED ORDER — HEPARIN SODIUM (PORCINE) 5000 UNIT/ML IJ SOLN
5000.0000 [IU] | Freq: Three times a day (TID) | INTRAMUSCULAR | Status: DC
  Administered 2013-06-23 – 2013-06-24 (×4): 5000 [IU] via SUBCUTANEOUS
  Filled 2013-06-23 (×7): qty 1

## 2013-06-23 MED ORDER — CHLORHEXIDINE GLUCONATE 0.12 % MT SOLN
15.0000 mL | Freq: Two times a day (BID) | OROMUCOSAL | Status: DC
  Administered 2013-06-23 – 2013-06-24 (×4): 15 mL via OROMUCOSAL
  Filled 2013-06-23 (×4): qty 15

## 2013-06-23 MED ORDER — PROPOFOL 10 MG/ML IV EMUL
5.0000 ug/kg/min | INTRAVENOUS | Status: DC
  Administered 2013-06-23 (×2): 35 ug/kg/min via INTRAVENOUS
  Administered 2013-06-23: 50 ug/kg/min via INTRAVENOUS
  Administered 2013-06-23 (×2): 35 ug/kg/min via INTRAVENOUS
  Administered 2013-06-24 (×3): 50 ug/kg/min via INTRAVENOUS
  Filled 2013-06-23 (×8): qty 100

## 2013-06-23 MED ORDER — TAMSULOSIN HCL 0.4 MG PO CAPS
0.8000 mg | ORAL_CAPSULE | Freq: Every day | ORAL | Status: DC
  Administered 2013-06-23: 0.8 mg via ORAL
  Filled 2013-06-23 (×2): qty 2

## 2013-06-23 MED ORDER — ASPIRIN 81 MG PO CHEW
324.0000 mg | CHEWABLE_TABLET | ORAL | Status: AC
  Administered 2013-06-23: 324 mg via ORAL

## 2013-06-23 MED ORDER — ASPIRIN 81 MG PO CHEW
CHEWABLE_TABLET | ORAL | Status: AC
  Filled 2013-06-23: qty 4

## 2013-06-23 MED ORDER — DEXTROSE 5 % IV SOLN
1.0000 g | Freq: Once | INTRAVENOUS | Status: AC
  Administered 2013-06-23: 1 g via INTRAVENOUS
  Filled 2013-06-23: qty 10

## 2013-06-23 MED ORDER — DOXAZOSIN MESYLATE 1 MG PO TABS: 1.0000 mg | ORAL_TABLET | Freq: Every day | ORAL | Status: DC

## 2013-06-23 MED ORDER — BIOTENE DRY MOUTH MT LIQD
15.0000 mL | Freq: Four times a day (QID) | OROMUCOSAL | Status: DC
  Administered 2013-06-23 – 2013-06-24 (×6): 15 mL via OROMUCOSAL

## 2013-06-23 MED ORDER — SIMVASTATIN 20 MG PO TABS
20.0000 mg | ORAL_TABLET | Freq: Every evening | ORAL | Status: DC
  Administered 2013-06-23: 20 mg via ORAL
  Filled 2013-06-23 (×2): qty 1

## 2013-06-23 MED ORDER — SUCCINYLCHOLINE CHLORIDE 20 MG/ML IJ SOLN
INTRAMUSCULAR | Status: AC
  Filled 2013-06-23: qty 1

## 2013-06-23 MED ORDER — MORPHINE SULFATE 4 MG/ML IJ SOLN
4.0000 mg | INTRAMUSCULAR | Status: DC | PRN
  Administered 2013-06-23 – 2013-06-24 (×4): 4 mg via INTRAVENOUS
  Filled 2013-06-23 (×5): qty 1

## 2013-06-23 MED ORDER — IPRATROPIUM BROMIDE HFA 17 MCG/ACT IN AERS
4.0000 | INHALATION_SPRAY | RESPIRATORY_TRACT | Status: DC
  Administered 2013-06-23 – 2013-06-24 (×8): 4 via RESPIRATORY_TRACT
  Filled 2013-06-23: qty 12.9

## 2013-06-23 MED ORDER — ASPIRIN 300 MG RE SUPP
300.0000 mg | RECTAL | Status: AC
  Filled 2013-06-23: qty 1

## 2013-06-23 MED ORDER — LISINOPRIL 20 MG PO TABS
20.0000 mg | ORAL_TABLET | Freq: Every day | ORAL | Status: DC
  Administered 2013-06-23: 20 mg via ORAL
  Filled 2013-06-23 (×2): qty 1

## 2013-06-23 MED ORDER — FAMOTIDINE IN NACL 20-0.9 MG/50ML-% IV SOLN
20.0000 mg | Freq: Two times a day (BID) | INTRAVENOUS | Status: DC
  Administered 2013-06-23 – 2013-06-24 (×3): 20 mg via INTRAVENOUS
  Filled 2013-06-23 (×4): qty 50

## 2013-06-23 MED ORDER — ALBUTEROL SULFATE HFA 108 (90 BASE) MCG/ACT IN AERS
4.0000 | INHALATION_SPRAY | RESPIRATORY_TRACT | Status: DC
  Administered 2013-06-23 – 2013-06-24 (×8): 4 via RESPIRATORY_TRACT
  Filled 2013-06-23: qty 6.7

## 2013-06-23 MED ORDER — FINASTERIDE 5 MG PO TABS
5.0000 mg | ORAL_TABLET | Freq: Every day | ORAL | Status: DC
  Administered 2013-06-23 – 2013-06-25 (×3): 5 mg via ORAL
  Filled 2013-06-23 (×3): qty 1

## 2013-06-23 NOTE — Progress Notes (Deleted)
Site assessed 

## 2013-06-23 NOTE — ED Notes (Addendum)
Pt's brother Dorinda Hill Blanchet can be reached at [phone].  Pt's ex girl friend left contact numbers, Phoebe Sharps [phone] and [phone].

## 2013-06-23 NOTE — ED Provider Notes (Signed)
Asked to assist with intubation - pt has OD, difficult intubation.  INTUBATION Performed by: Vida Roller  Required items: required blood products, implants, devices, and special equipment available Patient identity confirmed: provided demographic data and hospital-assigned identification number Time out: Immediately prior to procedure a "time out" was called to verify the correct patient, procedure, equipment, support staff and site/side marked as required.  Indications: Altered MS, low GCS  Intubation method: Direct laryngoscopy  Preoxygenation: BVM  Sedatives: Etomidate 20mg  Paralytic: 150mg  Succinylcholine  Tube Size: 8 cuffed  Post-procedure assessment: chest rise and ETCO2 monitor Breath sounds: equal and absent over the epigastrium Tube secured with: ETT holder Chest x-ray interpreted by radiologist and me.  Chest x-ray findings: endotracheal tube in appropriate position  Patient tolerated the procedure well with no immediate complications.     Vida Roller, MD 06/23/13 [phone]

## 2013-06-23 NOTE — ED Notes (Signed)
Report called to [facility]

## 2013-06-23 NOTE — ED Notes (Signed)
Attempted to call report to [facility], Charge nurse stated nurse unavailable for report

## 2013-06-23 NOTE — ED Notes (Signed)
Concern for pt's airway expressed to EDP, pt unable to clear secretions, preparing to intubate.

## 2013-06-23 NOTE — H&P (Signed)
Name: [name] [name] MRN: [id number] DOB: 1961/07/17    LOS: 1  Referring Provider:  Dr. Adriana Simas  Reason for Referral:  respiratory failure   PULMONARY / CRITICAL CARE MEDICINE  HPI:  [name] [name] is a 52 y/o man with past medical history of HTN, Spinal surgery and severe constipation who was transported to [facility] ED after taking an unknown amount of flexeril in an attempt to harm himself.  He was obtunded on arrival to the ED and was promptly intubated.  According to [facility] after intubation he was agitated after intubation and required sedation.  He was moving all four extremities prior to sedation with propofol.   Past Medical History  Diagnosis Date  . Constipation due to pain medication   . COPD (chronic obstructive pulmonary disease)   . Status post spinal surgery   . Hypertension   . Anxiety    Past Surgical History  Procedure Laterality Date  . Spinal fixation surgery     Prior to Admission medications   Medication Sig Start Date End Date Taking? Authorizing Provider  cyclobenzaprine (FLEXERIL) 5 MG tablet TAKE ONE TABLET BY MOUTH THREE TIMES DAILY AS NEEDED FOR MUSCLE SPASM 02/26/13  Yes Phebe Colla, MD  albuterol (PROVENTIL HFA) 108 (90 BASE) MCG/ACT inhaler INHALE TWO PUFFS BY MOUTH INTO LUNGS EVERY 4 HOURS AS NEEDED FOR WHEEZING 06/17/13   Ozella Rocks, MD  amitriptyline (ELAVIL) 50 MG tablet TAKE TWO TABLETS BY MOUTH TWICE DAILY 06/17/13   Ozella Rocks, MD  bisacodyl (DULCOLAX) 5 MG EC tablet Take 5 mg by mouth daily as needed.    Historical Provider, MD  butalbital-acetaminophen-caffeine (FIORICET, ESGIC) [phone] MG per tablet Take 1 tablet by mouth 2 (two) times daily as needed for headache. 06/17/13   Ozella Rocks, MD  ciprofloxacin-dexamethasone [facility]) otic suspension Place 4 drops into the left ear 2 (two) times daily. Treat for 5 days 04/05/13   Ozella Rocks, MD  cyclobenzaprine (FLEXERIL) 5 MG tablet Take 1 tablet (5 mg total) by mouth 3 (three) times daily  as needed for muscle spasms. 06/17/13   Ozella Rocks, MD  diclofenac (VOLTAREN) 75 MG EC tablet TAKE ONE TABLET BY MOUTH TWICE DAILY. As needed for pain 06/17/13   Ozella Rocks, MD  diclofenac sodium (VOLTAREN) 1 % GEL Apply 2 g topically 4 (four) times daily. As needed for pain 06/17/13   Ozella Rocks, MD  doxazosin (CARDURA) 1 MG tablet TAKE ONE TABLET BY MOUTH AT BEDTIME 04/23/13   Ozella Rocks, MD  finasteride (PROSCAR) 5 MG tablet Take 1 tablet (5 mg total) by mouth daily. 06/04/13   Ozella Rocks, MD  fluticasone (FLONASE) 50 MCG/ACT nasal spray USE TWO SPRAY IN EACH NOSTRIL TWICE DAILY AS NEEDED FOR  RHINITIS,  STOP  IF  YOU  DEVELOP  A  NOSE  BLEED 06/17/13   Ozella Rocks, MD  gabapentin (NEURONTIN) 300 MG capsule Take 2 capsules (600 mg total) by mouth 4 (four) times daily. 05/16/13   Glori Luis, MD  hydrochlorothiazide (HYDRODIURIL) 25 MG tablet Take 1 tablet (25 mg total) by mouth daily. 06/17/13 06/17/14  Ozella Rocks, MD  lisinopril (PRINIVIL,ZESTRIL) 20 MG tablet Take 1 tablet (20 mg total) by mouth daily. 06/17/13 06/17/14  Ozella Rocks, MD  loratadine (CLARITIN) 10 MG tablet Take 1 tablet (10 mg total) by mouth daily. 11/15/11 11/14/12  Ozella Rocks, MD  LORazepam (ATIVAN) 2 MG tablet Take 1  tablet (2 mg total) by mouth 2 (two) times daily. 04/05/13 04/15/13  Ozella Rocks, MD  LORazepam (ATIVAN) 2 MG tablet Take 1 tablet (2 mg total) by mouth 2 (two) times daily. 06/17/13 06/27/13  Ozella Rocks, MD  lubiprostone (AMITIZA) 24 MCG capsule Take 24 mcg by mouth 2 (two) times daily with a meal.    Historical Provider, MD  phenazopyridine (PYRIDIUM) 100 MG tablet Take 1 tablet (100 mg total) by mouth 3 (three) times daily as needed for pain. 06/17/13   Ozella Rocks, MD  simvastatin (ZOCOR) 20 MG tablet Take 1 tablet (20 mg total) by mouth every evening. 04/22/13 04/22/14  Ozella Rocks, MD  Tamsulosin HCl (FLOMAX) 0.4 MG CAPS Take 2 capsules (0.8 mg total) by mouth  daily. 05/25/12   Ozella Rocks, MD  testosterone cypionate (DEPOTESTOTERONE CYPIONATE) 200 MG/ML injection Inject 0.5 mLs (100 mg total) into the muscle every 14 (fourteen) days. 04/05/13   Ozella Rocks, MD  tiotropium (SPIRIVA) 18 MCG inhalation capsule Place 1 capsule (18 mcg total) into inhaler and inhale daily. 12/25/12 12/25/13  Ozella Rocks, MD  traZODone (DESYREL) 100 MG tablet Take 1 tablet (100 mg total) by mouth at bedtime. 06/17/13 10/21/99  Ozella Rocks, MD   Allergies Allergies  Allergen Reactions  . Fentanyl     Per pt he had a near overdose on these due to "regulating body temp"  . Methadone Itching    Family History Family History  Problem Relation Age of Onset  . Cancer Neg Hx    Social History  reports that he has been smoking Cigarettes.  He has been smoking about 1.00 pack per [place]. He does not have any smokeless tobacco history on file. He reports that he does not use illicit drugs. His alcohol history is not on file.  Review Of Systems:  A full review of systems could not be obtained as the patient is intubated and sedated  Brief patient description:  52 y/o man with drug overdose  Events Since Admission: intubation  Current Status: critical  Vital Signs: Temp:  [98.3 F (36.8 C)-99 F (37.2 C)] 98.3 F (36.8 C) (08/17 0246) Pulse Rate:  [119-146] 130 (08/17 0100) Resp:  [14-31] 27 (08/17 0100) BP: (117-170)/(87-114) 143/104 mmHg (08/17 0100) SpO2:  [93 %-100 %] 99 % (08/17 0100) FiO2 (%):  [60 %-100 %] 60 % (08/17 0246) Weight:  [122.471 kg (270 lb)] 122.471 kg (270 lb) (08/16 2127)  Physical Examination: General:  Laying in bed, intubated and sedated Neuro:  Sedated HEENT:  PERRL,   Neck:  No masses Cardiovascular:  NRRR, no mrg Lungs:  CTAB, no wrr Abdomen:  Soft, NTND, +BS  Musculoskeletal:  No joint abnormalities Skin:  Abrasions on right knee and left shin  Principal Problem:   Acute respiratory failure with hypoxia Active  Problems:   Overdose   ASSESSMENT AND PLAN  PULMONARY  Recent Labs Lab 06/22/13 0010  PHART 7.299*  PCO2ART 45.6*  PO2ART 351.0*  HCO3 21.7  O2SAT 99.3   Ventilator Settings: Vent Mode:  [-] PRVC FiO2 (%):  [60 %-100 %] 60 % Set Rate:  [14 bmp] 14 bmp Vt Set:  [600 mL] 600 mL PEEP:  [5 cmH20] 5 cmH20 Plateau Pressure:  [18 cmH20-20 cmH20] 18 cmH20 CXR:  ETT in good position, no focal infiltrates ETT:  8.0 cuffed  A:  Respiratory failure 2/2 to drug overdose, possible aspiration  P:   Support with ventilator until  mental status improves  Albuterol/Ippratroprium q4h  CARDIOVASCULAR  Recent Labs Lab 06/22/13 2154  TROPONINI <0.30   ECG:  NSR Lines: PIVs  A: No current problems P:  Continuous cardiopulmonary monitoring   RENAL  Recent Labs Lab 06/22/13 2154  NA 143  K 3.5  CL 108  CO2 25  BUN 5*  CREATININE 0.86  CALCIUM 9.0   Intake/Output     08/16 0701 - 08/17 0700   Urine (mL/kg/hr) 1200   Total Output 1200   Net -1200        Foley:  Placed 8/17  A:  Sediment in urine P:   UA and culture sent  GASTROINTESTINAL  Recent Labs Lab 06/22/13 2154  AST 14  ALT 17  ALKPHOS 84  BILITOT 0.3  PROT 7.2  ALBUMIN 3.8    A:  No current problems P:   NG tube in place on LIWS for now Will monitor output, if minimal will place to gravity and start tube feeds  HEMATOLOGIC  Recent Labs Lab 06/17/13 1418 06/22/13 2154  HGB 16.2 16.8  HCT 47.2 48.6  PLT 266 218   A:  No current problems P:  Monitor daily CBC  INFECTIOUS  Recent Labs Lab 06/17/13 1418 06/22/13 2154  WBC 12.3* 11.6*   Cultures: urine Antibiotics: None   A:  Urine with sediment P:   Follow up UA, if concerning for UTI will treat   NEUROLOGIC  A:  Altered mental status secondary to drug overdose P:   Wait for flexeril to be metabolized Sedation with propofol and prn morphine (pt with fentanyl allergy)  BEST PRACTICE / DISPOSITION Level of Care:   ICU Primary Service:  PCCM Consultants:  Psychiatry after pt wakes Code Status:  full Diet:  NPO DVT Px:  Heparin, SCDs GI Px:  Pepcid Skin Integrity:  good Social / Family:  No family at bedside, contact information for brother in the chart.   I spent 35 minutes of critical care time in the care of this patient separate from procedures which are documented elsewhere   Belinda Block, Charlann Lange., M.D. Pulmonary and Critical Care Medicine [facility] Pager: [phone]  06/23/2013, 3:02 AM

## 2013-06-23 NOTE — Progress Notes (Deleted)
Site assessed

## 2013-06-23 NOTE — ED Notes (Signed)
[facility] here to transport pt 

## 2013-06-23 NOTE — Progress Notes (Signed)
Pt blood gas in states 06/22/2013 0010, should state 06/23/2013 0010. PH 7.299,pco2 45.6, p02, 351, 21.7 hco3

## 2013-06-24 ENCOUNTER — Inpatient Hospital Stay (HOSPITAL_COMMUNITY): Payer: Medicaid Other

## 2013-06-24 DIAGNOSIS — T1491XA Suicide attempt, initial encounter: Secondary | ICD-10-CM

## 2013-06-24 DIAGNOSIS — J9600 Acute respiratory failure, unspecified whether with hypoxia or hypercapnia: Secondary | ICD-10-CM

## 2013-06-24 DIAGNOSIS — Z5189 Encounter for other specified aftercare: Secondary | ICD-10-CM

## 2013-06-24 LAB — CBC
MCH: 32.8 pg (ref 26.0–34.0)
MCHC: 34.9 g/dL (ref 30.0–36.0)
MCV: 94.1 fL (ref 78.0–100.0)
Platelets: 191 10*3/uL (ref 150–400)
RBC: 4.78 MIL/uL (ref 4.22–5.81)

## 2013-06-24 LAB — BASIC METABOLIC PANEL
CO2: 23 mEq/L (ref 19–32)
Calcium: 8.2 mg/dL — ABNORMAL LOW (ref 8.4–10.5)
Creatinine, Ser: 0.77 mg/dL (ref 0.50–1.35)
GFR calc non Af Amer: >90 mL/min (ref >90)
Glucose, Bld: 88 mg/dL (ref 70–99)

## 2013-06-24 LAB — GLUCOSE, CAPILLARY
Glucose-Capillary: 105 mg/dL — ABNORMAL HIGH (ref 70–99)
Glucose-Capillary: 89 mg/dL (ref 70–99)
Glucose-Capillary: 99 mg/dL (ref 70–99)

## 2013-06-24 LAB — URINE CULTURE

## 2013-06-24 MED ORDER — TRAZODONE HCL 100 MG PO TABS
100.0000 mg | ORAL_TABLET | Freq: Every day | ORAL | Status: DC
  Filled 2013-06-24 (×2): qty 1

## 2013-06-24 MED ORDER — LORAZEPAM 1 MG PO TABS
1.0000 mg | ORAL_TABLET | Freq: Two times a day (BID) | ORAL | Status: DC
  Administered 2013-06-24 – 2013-06-25 (×3): 1 mg via ORAL
  Filled 2013-06-24 (×3): qty 1

## 2013-06-24 MED ORDER — LISINOPRIL 20 MG PO TABS
20.0000 mg | ORAL_TABLET | Freq: Every day | ORAL | Status: DC
Start: 2013-06-25 — End: 2013-06-25
  Administered 2013-06-25: 20 mg via ORAL
  Filled 2013-06-24: qty 1

## 2013-06-24 MED ORDER — MORPHINE SULFATE 15 MG PO TABS
15.0000 mg | ORAL_TABLET | ORAL | Status: DC | PRN
  Administered 2013-06-24 – 2013-06-25 (×4): 15 mg via ORAL
  Filled 2013-06-24 (×4): qty 1

## 2013-06-24 MED ORDER — TAMSULOSIN HCL 0.4 MG PO CAPS
0.8000 mg | ORAL_CAPSULE | Freq: Every day | ORAL | Status: DC
  Administered 2013-06-25: 0.8 mg via ORAL
  Filled 2013-06-24 (×2): qty 2

## 2013-06-24 MED ORDER — BISACODYL 5 MG PO TBEC
5.0000 mg | DELAYED_RELEASE_TABLET | Freq: Every day | ORAL | Status: DC | PRN
  Filled 2013-06-24: qty 1

## 2013-06-24 MED ORDER — PHENAZOPYRIDINE HCL 100 MG PO TABS
100.0000 mg | ORAL_TABLET | Freq: Three times a day (TID) | ORAL | Status: DC | PRN
  Administered 2013-06-24 (×2): 100 mg via ORAL
  Filled 2013-06-24 (×2): qty 1

## 2013-06-24 MED ORDER — TIOTROPIUM BROMIDE MONOHYDRATE 18 MCG IN CAPS
18.0000 ug | ORAL_CAPSULE | Freq: Every day | RESPIRATORY_TRACT | Status: DC
  Administered 2013-06-24 – 2013-06-25 (×2): 18 ug via RESPIRATORY_TRACT
  Filled 2013-06-24: qty 5

## 2013-06-24 MED ORDER — ALBUTEROL SULFATE (5 MG/ML) 0.5% IN NEBU: 2.5000 mg | INHALATION_SOLUTION | RESPIRATORY_TRACT | Status: DC | PRN

## 2013-06-24 MED ORDER — MORPHINE SULFATE 2 MG/ML IJ SOLN
1.0000 mg | INTRAMUSCULAR | Status: DC | PRN
  Administered 2013-06-24: 2 mg via INTRAVENOUS

## 2013-06-24 NOTE — Progress Notes (Signed)
Name: [name] [name] MRN: [id number] DOB: 1961/09/27    LOS: 2  Referring Provider:  Dr. Adriana Simas  Reason for Referral:  respiratory failure   PULMONARY / CRITICAL CARE MEDICINE  Brief patient description:  [name] [name] is a 52 y/o man with past medical history of HTN, Spinal surgery and severe constipation who was transported to [facility] ED after taking an unknown amount of flexeril in an attempt to harm himself.  He was obtunded on arrival to the ED and was promptly intubated.       Events Since Admission: 8/17 admitted in transfer from [facility] intubated after intentional OD 8/18 Passed SBT. RASS +1. + F/C. Extubated and looks good. Ambulated after extubation. Behavioral Health consult requested   Lines, Tubes, etc: ETT 8/17 >> 8/18  Microbiology: Urine 8/17 >> NEG  Antibiotics:  none   Subj:  Passed SBT. RASS +1. + F/C. Extubated and looks good. Ambulated after extubation  Vital Signs: Temp:  [98.1 F (36.7 C)-100.7 F (38.2 C)] 98.1 F (36.7 C) (08/18 1127) Pulse Rate:  [66-98] 82 (08/18 1000) Resp:  [18-30] 26 (08/18 1100) BP: (101-152)/(62-88) 145/83 mmHg (08/18 1100) SpO2:  [95 %-100 %] 95 % (08/18 1214) FiO2 (%):  [40 %] 40 % (08/18 0900) Weight:  [120.4 kg (265 lb 6.9 oz)] 120.4 kg (265 lb 6.9 oz) (08/18 0400)  Physical Examination: General:  NAD after extubation Neuro:  No focal deficits HEENT:  NCAT, PERRL   Cardiovascular:  RRR, no M Lungs:  Sl coarse, no wheezes Abdomen:  Soft, NTND, +BS  Ext: no edema  BMET    Component Value Date/Time   NA 141 06/24/2013 0340   K 3.4* 06/24/2013 0340   CL 109 06/24/2013 0340   CO2 23 06/24/2013 0340   GLUCOSE 88 06/24/2013 0340   BUN 10 06/24/2013 0340   CREATININE 0.77 06/24/2013 0340   CREATININE 0.98 03/28/2013 0834   CALCIUM 8.2* 06/24/2013 0340   GFRNONAA >90 06/24/2013 0340   GFRAA >90 06/24/2013 0340    CBC    Component Value Date/Time   WBC 12.9* 06/24/2013 0340   RBC 4.78 06/24/2013 0340   HGB 15.7 06/24/2013 0340    HCT 45.0 06/24/2013 0340   PLT 191 06/24/2013 0340   MCV 94.1 06/24/2013 0340   MCH 32.8 06/24/2013 0340   MCHC 34.9 06/24/2013 0340   RDW 14.1 06/24/2013 0340    CXR: RLL Atx  ASSESSMENT AND PLAN  PULMONARY A:  Respiratory failure 2/2 to drug overdose, resolved  P:   Extubated today Supplemental O2 BDs ordered  CARDIOVASCULAR A: No current problems P:  Monitor  RENAL A:  No acute issues P:   Monitor BMET intermittently Correct electrolytes as indicated   GASTROINTESTINAL A:  No current problems P:   Begin diet post extubation  HEMATOLOGIC A:  No current problems P:  CBC intermittently  INFECTIOUS A:  Abn UA. Doubt UTI P:   Monitor off abx   NEUROLOGIC  A:   Acute encephalopathy due to drug OD Suicide attempt Anxiety d/o Chronic pain syndrome P:   Resume reduced dose of analgesics and benzo's Psych consult to eval for inpt therapy and to advise re: chronic regimen   I spent 35 minutes of critical care time in the care of this patient separate from procedures which are documented elsewhere   Billy Fischer, M.D. Pulmonary and Critical Care Medicine [facility] Pager: [phone]  06/24/2013, 12:26 PM

## 2013-06-24 NOTE — Care Management Note (Signed)
    Page 1 of 1   06/24/2013     8:56:44 AM   CARE MANAGEMENT NOTE 06/24/2013  Patient:  [name] [name]   Account Number:  [id number]  Date Initiated:  06/24/2013  Documentation initiated by:  [name] [name]  Subjective/Objective Assessment:   adm w overdose, on vent     Action/Plan:   lives w friend   Anticipated DC Date:     Anticipated DC Plan:        DC Planning Services  CM consult      Choice offered to / List presented to:             Status of service:   Medicare Important Message given?   (If response is "NO", the following Medicare IM given date fields will be blank) Date Medicare IM given:   Date Additional Medicare IM given:    Discharge Disposition:    Per UR Regulation:  Reviewed for med. necessity/level of care/duration of stay  If discussed at Long Length of Stay Meetings, dates discussed:    Comments:

## 2013-06-24 NOTE — Progress Notes (Signed)
At [phone] 06/24/13 the pt was extubated per MD. Vitals include HR 93 Resp 20 B/P 130/73 O2 95%. Current RASS 0. Pt was instructed to cough and how to use the incentive spirometer.

## 2013-06-24 NOTE — Progress Notes (Signed)
Ambulated on 4liters around the nursing station. Tolerated well. No complaints relayed to nursing. Up in recliner with sitter at bedside

## 2013-06-24 NOTE — Progress Notes (Signed)
Post extubation, RN at bedside sitting with patient for admission diagnosis of attempted OD. Patient stable, mentation intact. Denies SI ideation, Contracts for safety. Will continue to provide sitter at bedside till cleared by MD.

## 2013-06-24 NOTE — Progress Notes (Signed)
Patient demanding to leave AMA.  Dr  Bard Herbert informed. Due to admission diagnosis of overdose, patient will not be able to leave AMA.

## 2013-06-24 NOTE — Procedures (Signed)
Extubation Procedure Note  Patient Details:   Name: Carvel Seeley DOB: 11-10-1960 MRN: [id number]   Airway Documentation:     Evaluation  O2 sats: stable throughout Complications: No apparent complications Patient did tolerate procedure well. Bilateral Breath Sounds: Clear;Diminished Suctioning: Airway Yes pt able to vocalize.  Pt extubated at this time per MD order and tolerated well. Pt able to breathe around deflated cuff. No complications noted. VS stable on 4L [place]. No stridor noted. RT will continue to monitor.   Loyal Jacobson [facility] 06/24/2013, 9:30 AM

## 2013-06-24 NOTE — Clinical Social Work Psych Note (Addendum)
06/25/2013- 8:25am - CSW reviewed chart to find patient continues to need psychiatric evaluation.  CSW contacted [facility] and spoke to [place] who reports that no consult has been received.  This information differs from what was received yesterday (Monday).  Psych CSW contacted unit RN and updated RN.  Psych CSW informed unit RN that a consult needed to be placed by MD to [facility].  RN acknowledged understanding and is agreeable to contact MD to request this consult.  06/24/2013 - Psych CSW received a call from unit RN with information that pt is cooperative at this time, but often asking to leave.  RN inquired to when the psychiatrist would round.  CSW advised RN that psychiatry generally rounds after 2pm.  Psych CSW contacted [facility] to confirm that pt was on the board to be seen today.  This was confirmed.  Vickii Penna, LCSWA [phone]  Clinical Social Work

## 2013-06-24 NOTE — Progress Notes (Signed)
[facility] ADULT ICU REPLACEMENT PROTOCOL FOR AM LAB REPLACEMENT ONLY  The patient does not apply for the [facility] Adult ICU Electrolyte Replacment Protocol based on the criteria listed below:     Is urine output >/= 0.5 ml/kg/hr for the last 6 hours? no Patient's UOP is 0.2 ml/kg/hr  4Abnormal electrolyte(s): K3.4   If a panic level lab has been reported, has the CCM MD in charge been notified? YES   Physician:  David Stall, MD  Melrose Nakayama 06/24/2013 5:12 AM

## 2013-06-25 DIAGNOSIS — T50901A Poisoning by unspecified drugs, medicaments and biological substances, accidental (unintentional), initial encounter: Secondary | ICD-10-CM

## 2013-06-25 DIAGNOSIS — F411 Generalized anxiety disorder: Secondary | ICD-10-CM

## 2013-06-25 DIAGNOSIS — F329 Major depressive disorder, single episode, unspecified: Secondary | ICD-10-CM

## 2013-06-25 DIAGNOSIS — I10 Essential (primary) hypertension: Secondary | ICD-10-CM

## 2013-06-25 DIAGNOSIS — X838XXA Intentional self-harm by other specified means, initial encounter: Secondary | ICD-10-CM

## 2013-06-25 MED ORDER — HYDROCHLOROTHIAZIDE 25 MG PO TABS
25.0000 mg | ORAL_TABLET | Freq: Once | ORAL | Status: AC
  Administered 2013-06-25: 25 mg via ORAL
  Filled 2013-06-25: qty 1

## 2013-06-25 MED ORDER — PNEUMOCOCCAL VAC POLYVALENT 25 MCG/0.5ML IJ INJ
0.5000 mL | INJECTION | INTRAMUSCULAR | Status: AC
  Administered 2013-06-25: 0.5 mL via INTRAMUSCULAR
  Filled 2013-06-25: qty 0.5

## 2013-06-25 MED ORDER — SODIUM CHLORIDE 0.9 % IV SOLN: INTRAVENOUS | Status: DC

## 2013-06-25 MED ORDER — ACETAMINOPHEN 325 MG PO TABS
650.0000 mg | ORAL_TABLET | Freq: Four times a day (QID) | ORAL | Status: DC | PRN
  Administered 2013-06-25: 650 mg via ORAL

## 2013-06-25 MED ORDER — FINASTERIDE 5 MG PO TABS: 5.0000 mg | ORAL_TABLET | Freq: Every day | ORAL | Status: DC

## 2013-06-25 NOTE — Clinical Social Work Psych Note (Signed)
Psych CSW received call from unit RN who reports Nanine Means, NP (psychiatrist) requests pt attend Chemical Dependency Intensive Outpatient Program at [facility].  Psych CSW contacted Charmian Muff at [facility] [phone]) who states that [facility] does not take Medicaid for CDIOP.  Charmian Muff, admissions coordinator, recommends [facility] and [facility].  Psych CSW will review this referral information with pt prior to d/c.  Vickii Penna, LCSWA [phone]  Clinical Social Work

## 2013-06-25 NOTE — Progress Notes (Signed)
Name: [name] [name] MRN: [id number] DOB: Apr 26, 1961    LOS: 3  Referring Provider:  Dr. Adriana Simas  Reason for Referral:  respiratory failure   PULMONARY / CRITICAL CARE MEDICINE  Brief patient description:  [name] [name] is a 52 y/o man with past medical history of HTN, Spinal surgery and severe constipation who was transported to [facility] ED after taking an unknown amount of flexeril in an attempt to harm himself.  He was obtunded on arrival to the ED and was promptly intubated.       Events Since Admission: 8/17 admitted in transfer from [facility] intubated after intentional OD 8/18 Passed SBT. RASS +1. + F/C. Extubated and looks good. Ambulated after extubation. Behavioral Health consult requested 8/19 [facility] eval:   Lines, Tubes, etc: ETT 8/17 >> 8/18  Microbiology: Urine 8/17 >> NEG  Antibiotics:  none   Subj:  No complaints. Anxious to go home. Awaiting [facility] eval  Vital Signs: Temp:  [97.8 F (36.6 C)-99.9 F (37.7 C)] 97.8 F (36.6 C) (08/19 0807) Pulse Rate:  [63-98] 63 (08/19 0745) Resp:  [14-31] 20 (08/19 0745) BP: (130-186)/(73-106) 173/100 mmHg (08/19 0745) SpO2:  [94 %-100 %] 99 % (08/19 0745)  Physical Examination: General:  NAD  Neuro:  No focal deficits HEENT:  NCAT, PERRL   Cardiovascular:  RRR, no M Lungs:  Sl coarse, no wheezes Abdomen:  Soft, NTND, +BS  Ext: no edema  BMET No new  CBC No new  CXR: No new  ASSESSMENT Respiratory failure 2/2 to drug overdose, resolved  Abn UA without evidence of infection Acute encephalopathy due to drug OD Suicide attempt Anxiety d/o Chronic pain syndrome  PLAN: Ready for discharge to home or [facility] after Psych eval Discharge on prior regimen with any modifications recommended by Psych Follow up with primary care MD and any F/U recommended by Psych   Billy Fischer, M.D. Pulmonary and Critical Care Medicine [facility] Pager: [phone]  06/25/2013, 9:02 AM

## 2013-06-25 NOTE — Progress Notes (Signed)
After communication with with Shaune Pollack NP Pt given direction to follow up with [facility] out patient clinic for intensive out patient rehab. Was told to have Patient contact Jeri Modena at [phone] and make them aware that Dr. Lucianne Muss has authorized his treatment. Pt reports he understands and is looking forward to starting rehab and is ready to make change in his life.  Pt wife present to transport Pt home.   Jacqulyn Cane RN, BSN, CCRN

## 2013-06-25 NOTE — Clinical Social Work Psych Note (Signed)
Clinical Social Work Department CLINICAL SOCIAL WORK PSYCHIATRY SERVICE LINE ASSESSMENT 06/25/2013  Patient:  [name] [name]  Account:  [id number]  Admit Date:  06/22/2013  Clinical Social Worker:  Read Drivers  Date/Time:  06/25/2013 03:10 PM Referred by:  Physician  Date referred:  06/25/2013 Reason for Referral  Behavioral Health Issues   Presenting Symptoms/Problems (In the person's/family's own words):   Psych was consulted for possible OD on barbituates   Abuse/Neglect/Trauma History (check all that apply)  Denies history   Abuse/Neglect/Trauma Comments:   none reported or noted in chart   Psychiatric History (check all that apply)  Denies history   Psychiatric medications:  LORazepam (ATIVAN) tablet 1 mg  Dose: 1 mg Freq: 2 times daily Route: PO  Start: 06/24/13 1230    traZODone (DESYREL) tablet 100 mg  Dose: 100 mg Freq: Daily at bedtime Route: PO  Start: 06/24/13 2200   Current Mental Health Hospitalizations/Previous Mental Health History:   none noted or reported   Current provider:   none noted or reported   Place and Date:   none reported or noted   Current Medications:   Scheduled Meds:      . finasteride  5 mg Oral Daily  . lisinopril  20 mg Oral Daily  . LORazepam  1 mg Oral BID  . tamsulosin  0.8 mg Oral QPC breakfast  . tiotropium  18 mcg Inhalation Daily  . traZODone  100 mg Oral QHS        Continuous Infusions:      PRN Meds:.sodium chloride, acetaminophen, albuterol, bisacodyl, morphine, phenazopyridine       Previous Impatient Admission/Date/Reason:   no hospital admissions within the past 6 months   Emotional Health / Current Symptoms    Suicide/Self Harm  None reported   Suicide attempt in the past:   pt was seen at [facility] for possible overdose.  Pt reports that this was not in effort to harm self.   Other harmful behavior:   none reported or noted in chart   Psychotic/Dissociative Symptoms  None reported   Other  Psychotic/Dissociative Symptoms:   none reported or noted in chart    Attention/Behavioral Symptoms  Within Normal Limits   Other Attention / Behavioral Symptoms:   none reported or noted in chart    Cognitive Impairment  Orientation - Place  Orientation - Self  Orientation - Situation  Orientation - Time  Poor Judgement   Other Cognitive Impairment:   none reported or noted in chart    Mood and Adjustment  Mood Congruent    Stress, Anxiety, Trauma, Any Recent Loss/Stressor  None reported   Anxiety (frequency):   none reported or noted in chart   Phobia (specify):   none reported or noted in chart   Compulsive behavior (specify):   none reported or noted in chart   Obsessive behavior (specify):   none reported or noted in chart   Other:   none reported or noted in chart   Substance Abuse/Use  History of substance use  Substance abuse treatment needed   SBIRT completed (please refer for detailed history):  Y  Self-reported substance use:   date of admission   Urinary Drug Screen Completed:  Y Alcohol level:   BAL<11  UDS positive for Barbituates    Environmental/Housing/Living Arrangement  Stable housing   Who is in the home:   girlfriend   Emergency contact:  Phoebe Sharps   Financial  Medicaid  Social Security Disability  Income   Patient's Strengths and Goals (patient's own words):   Pt was compliant with medical advice while in the hospital. Pt has supportive relationships.  Pt is seeking help for his SA.   Clinical Social Worker's Interpretive Summary:   Psych CSW assessed pt at bedside.  Psych CSW introduced self and Psych CSW role.  Pt was alert and oriented x4.  Pt was sitting quietly in bedside chair watching TV.  Pt was fully clothed and waiting on d/c.  Pt denied SI/HI.  Pt also denied AVDH.  Pt reports that his usage was not in any attempt to harm self.  Pt affect was appropriate.  Pt communication and thought processes were  coherent and logical throughout the assessment.    Pt was seen by psychiatrist, Nanine Means, NP who per RN, Thayer Ohm, recommended CD-IOP.  Psych CSW contacted Charmian Muff at [facility] to set this up.  Ann reports that [facility] does not accept Medicaid within the CD-IO Program.  Ann recommended [facility] and [facility].  These resources were reviewed with the pt and given to the patient as a handout.  Pt was agreeable to seeking help once d/c.  Pt acknowledged understanding of harmful effects of SA.  Pt was appreciative of Psych CSW assistance and thanked Psych CSW for time and efforts.   Disposition:  Outpatient referral made/needed  Vickii Penna, LCSWA [phone]  Clinical Social Work

## 2013-06-25 NOTE — Progress Notes (Signed)
Social Note  Pt seen and examined in unit 2100. Appreciate continued care by CCM team. Pt more sluggish in speech and thought process then at his baseline. This is likely either due to OD on flexeril and neurontin vs sedation from intubation. Pt w/o recollection of event. Fiance has not been over to see him yet. Anticipate transfer from ICU today vs tomorrow vs DC home. Will f/u w/ pt in office in 1-2 wks after DC. This is the first occurrence of attempted OD and will need to address further at next appt. Will f/u w/ inpt psych eval.   Shelly Flatten, MD Family Medicine PGY-3 06/25/2013, 2:17 PM

## 2013-06-25 NOTE — Discharge Summary (Signed)
Physician Discharge Summary     Patient ID: [name] [name] MRN: [id number] DOB/AGE: Jul 11, 1961 52 y.o.  Admit date: 06/22/2013 Discharge date: 06/25/2013  Discharge Diagnoses:  Respiratory failure 2/2 to drug overdose, resolved  Abn UA without evidence of infection  Acute encephalopathy due to drug OD (resolved) Suicide attempt  Anxiety d/o  Chronic pain syndrome Detailed Hospital Course:    [name] is a 52 y/o man with past medical history of HTN, Spinal surgery and severe constipation who was transported to [facility] ED after taking an unknown amount of flexeril in an reported attempt to harm himself. He was obtunded on arrival to the ED and was promptly intubated. According to [facility] after intubation he was agitated after intubation and required sedation. He was moving all four extremities prior to sedation with propofol.    He was admitted to the ICU, interventions included: mechanical ventilation from 8/17 to 8/18, IV hydration and supportive care. He was extubated on 8/18. His exam was unremarkable as if 8/19 and was deemed medically ready for discharge. Behavioral health was consulted in effort to be sure he was not at risk for self harm prior to d/c.    Discharge Plan by diagnoses  Drug overdose.  Possible suicide attempt.  Chronic pain.  Seen by psych. Spoke to primary care team.   Discharge Plan: Discharge to home  F/u with PCP.  Out pt drug and etoh council ing   Significant Hospital tests/ studies/ interventions and procedures  Consults: behavioral health   Discharge Exam: BP 149/91  Pulse 82  Temp(Src) 98.2 F (36.8 C) (Oral)  Resp 18  Ht 6' (1.829 m)  Wt 120.4 kg (265 lb 6.9 oz)  BMI 35.99 kg/m2  SpO2 98%  Physical Examination:  General: NAD  Neuro: No focal deficits  HEENT: NCAT, PERRL  Cardiovascular: RRR, no M  Lungs: Sl coarse, no wheezes  Abdomen: Soft, NTND, +BS  Ext: no edema   Labs at discharge Lab Results  Component Value Date   CREATININE 0.77 06/24/2013   BUN 10 06/24/2013   NA 141 06/24/2013   K 3.4* 06/24/2013   CL 109 06/24/2013   CO2 23 06/24/2013   Lab Results  Component Value Date   WBC 12.9* 06/24/2013   HGB 15.7 06/24/2013   HCT 45.0 06/24/2013   MCV 94.1 06/24/2013   PLT 191 06/24/2013   Lab Results  Component Value Date   ALT 17 06/22/2013   AST 14 06/22/2013   ALKPHOS 84 06/22/2013   BILITOT 0.3 06/22/2013   No results found for this basename: INR,  PROTIME    Current radiology studies Dg Chest Port 1 View  06/24/2013   *RADIOLOGY REPORT*  Clinical Data: Intubated patient.  PORTABLE CHEST - 1 VIEW  Comparison: Single view of the chest 06/23/2013 and 06/22/2013.  Findings: Endotracheal tube is in place with tip in good position at the level of the clavicular heads.  NG tube courses into the stomach and below the inferior margin of the film.  No pneumothorax is identified.  No pleural effusion is seen.  Subsegmental atelectasis in the lung bases, greater on the left, again seen.  IMPRESSION:  1.  Support apparatus in good position. 2.  Bibasilar subsegmental atelectasis, worse on the left.   Original Report Authenticated By: [name] [name], M.D.    Disposition:  Final discharge disposition not confirmed     Medication List    STOP taking these medications       cyclobenzaprine  5 MG tablet  Commonly known as:  FLEXERIL      TAKE these medications       albuterol 108 (90 BASE) MCG/ACT inhaler  Commonly known as:  PROVENTIL HFA  INHALE TWO PUFFS BY MOUTH INTO LUNGS EVERY 4 HOURS AS NEEDED FOR WHEEZING     bisacodyl 5 MG EC tablet  Generic drug:  bisacodyl  Take 5 mg by mouth daily as needed for constipation.     butalbital-acetaminophen-caffeine 50-325-40 MG per tablet  Commonly known as:  FIORICET, ESGIC  Take 1 tablet by mouth 2 (two) times daily as needed for headache.     diclofenac 75 MG EC tablet  Commonly known as:  VOLTAREN  Take 75 mg by mouth 2 (two) times daily as needed  (pain).     diclofenac sodium 1 % Gel  Commonly known as:  VOLTAREN  Apply 2 g topically daily as needed (pain).     finasteride 5 MG tablet  Commonly known as:  PROSCAR  Take 1 tablet (5 mg total) by mouth daily.     fluticasone 50 MCG/ACT nasal spray  Commonly known as:  FLONASE  USE TWO SPRAY IN EACH NOSTRIL TWICE DAILY AS NEEDED FOR  RHINITIS,  STOP  IF  YOU  DEVELOP  A  NOSE  BLEED     gabapentin 300 MG capsule  Commonly known as:  NEURONTIN  Take 2 capsules (600 mg total) by mouth 4 (four) times daily.     hydrochlorothiazide 25 MG tablet  Commonly known as:  HYDRODIURIL  Take 1 tablet (25 mg total) by mouth daily.     lisinopril 20 MG tablet  Commonly known as:  PRINIVIL,ZESTRIL  Take 1 tablet (20 mg total) by mouth daily.     LORazepam 2 MG tablet  Commonly known as:  ATIVAN  Take 1 tablet (2 mg total) by mouth 2 (two) times daily.     oxymorphone 10 MG tablet  Commonly known as:  OPANA  Take 10 mg by mouth every 4 (four) hours as needed for pain.     oxymorphone 40 MG 12 hr tablet  Commonly known as:  OPANA ER  Take 40 mg by mouth every 12 (twelve) hours.     phenazopyridine 100 MG tablet  Commonly known as:  PYRIDIUM  Take 1 tablet (100 mg total) by mouth 3 (three) times daily as needed for pain.     POTASSIUM CHLORIDE PO  Take 395 mg by mouth daily.     simvastatin 20 MG tablet  Commonly known as:  ZOCOR  Take 1 tablet (20 mg total) by mouth every evening.     tamsulosin 0.4 MG Caps capsule  Commonly known as:  FLOMAX  Take 2 capsules (0.8 mg total) by mouth daily.     testosterone cypionate 200 MG/ML injection  Commonly known as:  DEPOTESTOTERONE CYPIONATE  Inject 0.5 mLs (100 mg total) into the muscle every 14 (fourteen) days.     tiotropium 18 MCG inhalation capsule  Commonly known as:  SPIRIVA  Place 1 capsule (18 mcg total) into inhaler and inhale daily.     traZODone 100 MG tablet  Commonly known as:  DESYREL  Take 1 tablet (100 mg  total) by mouth at bedtime.       Follow-up Information   Follow up with MERRELL, Garnette Greb, MD. Schedule an appointment as soon as possible for a visit in 2 weeks.   Specialty:  Family Medicine   Contact information:  [address] [place] [address] [phone]       Discharged Condition: fair  Physician Statement:   The Patient was personally examined, the discharge assessment and plan has been personally reviewed and I agree with ACNP Babcock's assessment and plan. > 30 minutes of time have been dedicated to discharge assessment, planning and discharge instructions.   SignedAnders Simmonds 06/25/2013, 3:20 PM  Agree with above  Billy Fischer, MD ; [facility] [phone].  After 5:30 PM or weekends, call [phone]

## 2013-06-26 ENCOUNTER — Encounter (HOSPITAL_COMMUNITY): Payer: Self-pay | Admitting: Psychiatry

## 2013-06-26 NOTE — Consult Note (Signed)
Reason for Consult:  Overdose Referring Physician: Dr [name] [name] is an 52 y.o. male.  HPI: Patient states he was not trying to kill himself when he took too many Flexeril.  He took an Elavil and could not remember anything until awakening in the hospital, which has not been on for a few months--prescribed by his primary MD, Dr Moral for depression.  His girlfriend found him and taken to ED.  [name] [name] has no previous history of suicide attempts or inpatient psychiatric hospitalization.  He stated he made a mistake by taking the Elavil.  [name] [name] stated he was sent to a substance abuse program in [place] for a year and a half by the court system in the 80's for polysubstance abuse but has been clean since this time.  He endorses depression and anxiety related to chronic lower back pain and neck pain, frustrated that he cannot find a surgeon that will take his Medicaid; encouraged him to explore this issue with his social worker who may be of assistive with this problem.  [name] [name] does not feel suicidal and states he does not believe in suicide, the unforgivable sin.  He is interested in IOP for his chronic pain and related depression and anxiety.  He is prescribed Ativan BID PRN anxiety for the past six months and it has been effective; educated him on the addictive issues of this medication and encouraged him to discuss a taper with his MD and replacement with a non-narcotic or an antidepressant.  His girlfriend will come and get the patient to return home, his desire at this time with IOP follow-up.  Dr. Lucianne Muss has evaluated this patient and collaborates with the plan below.  Past Medical History  Diagnosis Date  . Constipation due to pain medication   . COPD (chronic obstructive pulmonary disease)   . Status post spinal surgery   . Hypertension   . Anxiety     Past Surgical History  Procedure Laterality Date  . Spinal fixation surgery      Family History  Problem Relation Age of Onset   . Cancer Neg Hx     Social History:  reports that he has been smoking Cigarettes.  He has been smoking about 1.00 pack per [place]. He does not have any smokeless tobacco history on file. He reports that he does not use illicit drugs. His alcohol history is not on file.  Allergies:  Allergies  Allergen Reactions  . Fentanyl     Per pt he had a near overdose on these due to "regulating body temp"  . Methadone Itching    Medications: I have reviewed the patient's current medications.  Results for orders placed during the hospital encounter of 06/22/13 (from the past 48 hour(s))  GLUCOSE, CAPILLARY     Status: None   Collection Time    06/24/13 11:25 AM      Result Value Range   Glucose-Capillary 89  70 - 99 mg/dL  GLUCOSE, CAPILLARY     Status: None   Collection Time    06/24/13  3:34 PM      Result Value Range   Glucose-Capillary 99  70 - 99 mg/dL  GLUCOSE, CAPILLARY     Status: None   Collection Time    06/24/13  7:38 PM      Result Value Range   Glucose-Capillary 81  70 - 99 mg/dL    No results found.  Review of Systems  Constitutional: Negative.   HENT:  Positive for sore throat.   Eyes: Negative.   Respiratory: Negative.   Cardiovascular: Negative.   Gastrointestinal: Negative.   Musculoskeletal: Negative.   Skin: Negative.   Neurological: Negative.   Endo/Heme/Allergies: Negative.   Psychiatric/Behavioral: Positive for depression. The patient is nervous/anxious.    Blood pressure 149/91, pulse 82, temperature 98.2 F (36.8 C), temperature source Oral, resp. rate 18, height 6' (1.829 m), weight 120.4 kg (265 lb 6.9 oz), SpO2 98.00%. Physical Exam Completed by primary MD, reviewed  Family History:  No family history on file.  Assessment/Plan:   Mental Status Examination/Evaluation: Patient is neatly groomed in his recliner chair in his room. He is calm and cooperative, denies any active or passive suicidal thoughts or homicidal thoughts.  His thoughts are  organized, engages easily, endorses depression with congruent affect.  There is no paranoia or delusions present at this time. He denies any auditory or visual hallucinations. His attention and concentration are good.  His insight and judgment are good, impulse control intact.  DIAGNOSIS:  AXIS I  Major Depressive Disorder, General Anxiety Disorder  AXIS II  Deferred   AXIS III   COPD, HTN, chronic back and neck pain  AXIS IV  other psychosocial or environmental problems, chronic pain issues, problems with access to health care services   AXIS V  61-70 mild symptoms    Assessment/Plan:  Recommend his regular provider or future IOP provider prescribe an anti-depressant for depression and anxiety; taper Ativan.  Patient does not need inpatient psychiatric treatment. Recommend followup treatment at IOP at [facility], [facility], or [facility]--information and phone numbers given to the patient. Contact Child psychotherapist for outpatient discharge planning.  Daneil Dan 06/25/2013, 8:37 AM

## 2013-07-10 ENCOUNTER — Encounter: Payer: Self-pay | Admitting: Family Medicine

## 2013-07-10 ENCOUNTER — Ambulatory Visit (INDEPENDENT_AMBULATORY_CARE_PROVIDER_SITE_OTHER): Payer: Medicaid Other | Admitting: Family Medicine

## 2013-07-10 VITALS — BP 141/98 | HR 108 | Temp 98.7°F | Wt 253.0 lb

## 2013-07-10 DIAGNOSIS — Z5189 Encounter for other specified aftercare: Secondary | ICD-10-CM

## 2013-07-10 DIAGNOSIS — N4889 Other specified disorders of penis: Secondary | ICD-10-CM

## 2013-07-10 DIAGNOSIS — F32A Depression, unspecified: Secondary | ICD-10-CM

## 2013-07-10 DIAGNOSIS — F329 Major depressive disorder, single episode, unspecified: Secondary | ICD-10-CM

## 2013-07-10 DIAGNOSIS — N139 Obstructive and reflux uropathy, unspecified: Secondary | ICD-10-CM

## 2013-07-10 DIAGNOSIS — F3289 Other specified depressive episodes: Secondary | ICD-10-CM

## 2013-07-10 LAB — POCT URINALYSIS DIPSTICK
Glucose, UA: NEGATIVE
Leukocytes, UA: NEGATIVE
Protein, UA: NEGATIVE
Urobilinogen, UA: 0.2

## 2013-07-10 MED ORDER — NORTRIPTYLINE HCL 25 MG PO CAPS: 25.0000 mg | ORAL_CAPSULE | Freq: Every day | ORAL | Status: DC

## 2013-07-10 MED ORDER — CYCLOBENZAPRINE HCL 5 MG PO TABS: ORAL_TABLET | ORAL | Status: DC

## 2013-07-10 NOTE — Patient Instructions (Addendum)
THank you for coming in today. You are doing well overall. Please start the Nortriptyline. Come back to see me in 2 weeks Please start the flexeril at 5mg , 3 times a [place] Please restart your trazadone as needed at night. We will let you know if anything comes back in your urine Have a great [place].

## 2013-07-10 NOTE — Progress Notes (Signed)
[name] [name] is a 52 y.o. male who presents to [facility] today for medcication management   Stopped amitriptyline approximately 1 month prior to OD episode. Felt like it was lowering his ambitions. No recollection of purposeful OD on flexeril. Confirmed that after returning from hospital had emptied flexeril. Denies current or past SI/HI. Medications currently locked in safe that fiance has combination to. She lays out meds daily for him to take. Currently only taking fioricet, voltaren gel, finesteride, flonase, gabapentin, HCTZ, linsopril, Ativan, Opana, Zocor, testosterone, flomax. Off amitriptyline, flexeril and trazodone. Shaking since admitted to hospital. feelign anxious. Denies CP, SOB, palpitations.   Very worried that this will happen again. Endorses one episode of acute dissociation before.   The following portions of the patient's history were reviewed and updated as appropriate: allergies, current medications, past medical history, family and social history, and problem list.  Patient is a smoker  Past Medical History  Diagnosis Date  . Constipation due to pain medication   . COPD (chronic obstructive pulmonary disease)   . Status post spinal surgery   . Hypertension   . Anxiety     ROS as above otherwise neg.    Medications reviewed. Current Outpatient Prescriptions  Medication Sig Dispense Refill  . albuterol (PROVENTIL HFA) 108 (90 BASE) MCG/ACT inhaler INHALE TWO PUFFS BY MOUTH INTO LUNGS EVERY 4 HOURS AS NEEDED FOR WHEEZING  7 each  0  . bisacodyl (BISACODYL) 5 MG EC tablet Take 5 mg by mouth daily as needed for constipation.      . butalbital-acetaminophen-caffeine (FIORICET, ESGIC) 50-325-40 MG per tablet Take 1 tablet by mouth 2 (two) times daily as needed for headache.  60 tablet  3  . diclofenac (VOLTAREN) 75 MG EC tablet Take 75 mg by mouth 2 (two) times daily as needed (pain).      Marland Kitchen diclofenac sodium (VOLTAREN) 1 % GEL Apply 2 g topically daily as needed (pain).       .  finasteride (PROSCAR) 5 MG tablet Take 1 tablet (5 mg total) by mouth daily.  30 tablet  3  . fluticasone (FLONASE) 50 MCG/ACT nasal spray USE TWO SPRAY IN EACH NOSTRIL TWICE DAILY AS NEEDED FOR  RHINITIS,  STOP  IF  YOU  DEVELOP  A  NOSE  BLEED  16 g  1  . gabapentin (NEURONTIN) 300 MG capsule Take 2 capsules (600 mg total) by mouth 4 (four) times daily.  240 capsule  3  . hydrochlorothiazide (HYDRODIURIL) 25 MG tablet Take 1 tablet (25 mg total) by mouth daily.  30 tablet  3  . lisinopril (PRINIVIL,ZESTRIL) 20 MG tablet Take 1 tablet (20 mg total) by mouth daily.  90 tablet  3  . LORazepam (ATIVAN) 2 MG tablet Take 1 tablet (2 mg total) by mouth 2 (two) times daily.  60 tablet  0  . oxymorphone (OPANA ER) 40 MG 12 hr tablet Take 40 mg by mouth every 12 (twelve) hours.      Marland Kitchen oxymorphone (OPANA) 10 MG tablet Take 10 mg by mouth every 4 (four) hours as needed for pain.      . phenazopyridine (PYRIDIUM) 100 MG tablet Take 1 tablet (100 mg total) by mouth 3 (three) times daily as needed for pain.  30 tablet  1  . POTASSIUM CHLORIDE PO Take 395 mg by mouth daily.      . simvastatin (ZOCOR) 20 MG tablet Take 1 tablet (20 mg total) by mouth every evening.  30 tablet  6  .  Tamsulosin HCl (FLOMAX) 0.4 MG CAPS Take 2 capsules (0.8 mg total) by mouth daily.  60 capsule  3  . testosterone cypionate (DEPOTESTOTERONE CYPIONATE) 200 MG/ML injection Inject 0.5 mLs (100 mg total) into the muscle every 14 (fourteen) days.  10 mL  0  . tiotropium (SPIRIVA) 18 MCG inhalation capsule Place 1 capsule (18 mcg total) into inhaler and inhale daily.  30 capsule  12  . traZODone (DESYREL) 100 MG tablet Take 1 tablet (100 mg total) by mouth at bedtime.  30 tablet  6   No current facility-administered medications for this visit.    Exam: BP 141/98  Pulse 108  Temp(Src) 98.7 F (37.1 C) (Oral)  Wt 253 lb (114.76 kg)  BMI 34.31 kg/m2 Gen: Well NAD  No results found for this or any previous visit (from the past 72  hour(s)).  Spent greater than in direct pt care

## 2013-07-11 ENCOUNTER — Encounter: Payer: Self-pay | Admitting: Family Medicine

## 2013-07-11 ENCOUNTER — Telehealth: Payer: Self-pay | Admitting: Family Medicine

## 2013-07-11 LAB — URINE CULTURE
Colony Count: NO GROWTH
Organism ID, Bacteria: NO GROWTH

## 2013-07-11 NOTE — Assessment & Plan Note (Signed)
Continue flomax and proscar. Changing amitriptyline to nortriptyline

## 2013-07-11 NOTE — Assessment & Plan Note (Signed)
Pt w/o recollection of event. Denies SI/HI H/o dissociateive episode Likely similar episode this time vs severe depression given recent life stressors and coming off Amitriptyline cold [place]. Not currently a threat to self or others No firearms in home [place], [name] [name], continues to keep other medications locked and administers to him as needed/directed. Starting Nortriptyline as similar to amitriptyline and w/ fewer anticholinergic side effects (urinary retention) and as amitriptyline worked for pt in past.

## 2013-07-11 NOTE — Assessment & Plan Note (Signed)
Stopped amitriptyline cold [place] w/o clinical consultation. Likely led in part to recent hospitalization Amitriptyline worked well for pt in past but due to urinary /BPH type complaints, will change to nortriptyline w/ fewer cholinergic side effects F/u in 2 wks

## 2013-07-11 NOTE — Assessment & Plan Note (Signed)
Improved w/ CTX during last hospitalization and with Pyridium Some mild return of symptoms over last few days UA today and UCX sent    ADDENDUM UA nml

## 2013-07-11 NOTE — Telephone Encounter (Signed)
Pt is calling because he was told that he could take more ativan if need be, but he if does this he will run out faster and will not be able to get this filled. He would like to know what he should do. JW

## 2013-07-12 NOTE — Telephone Encounter (Signed)
Take more as needed, and if going to run out early. Let me know and I'll refill or send in extra Rx

## 2013-07-14 ENCOUNTER — Other Ambulatory Visit: Payer: Self-pay | Admitting: Family Medicine

## 2013-07-14 ENCOUNTER — Encounter: Payer: Self-pay | Admitting: Family Medicine

## 2013-07-18 ENCOUNTER — Other Ambulatory Visit: Payer: Self-pay | Admitting: Family Medicine

## 2013-07-18 MED ORDER — ALBUTEROL SULFATE HFA 108 (90 BASE) MCG/ACT IN AERS: INHALATION_SPRAY | RESPIRATORY_TRACT | Status: DC

## 2013-07-24 ENCOUNTER — Ambulatory Visit: Payer: Self-pay | Admitting: Family Medicine

## 2013-08-07 ENCOUNTER — Encounter: Payer: Self-pay | Admitting: Family Medicine

## 2013-08-07 ENCOUNTER — Other Ambulatory Visit: Payer: Self-pay | Admitting: Family Medicine

## 2013-08-15 ENCOUNTER — Encounter: Payer: Self-pay | Admitting: Family Medicine

## 2013-08-16 ENCOUNTER — Telehealth: Payer: Self-pay | Admitting: Family Medicine

## 2013-08-16 DIAGNOSIS — F419 Anxiety disorder, unspecified: Secondary | ICD-10-CM

## 2013-08-18 NOTE — Telephone Encounter (Signed)
Ativan 2mg  [facility] Fiorello when filled  Shelly Flatten, MD Family Medicine PGY-3 08/18/2013, 10:12 AM

## 2013-08-18 NOTE — Assessment & Plan Note (Signed)
Called in Ativan refill Pt under significant stress Will discuss at next appt

## 2013-08-22 ENCOUNTER — Ambulatory Visit: Payer: Self-pay | Admitting: Family Medicine

## 2013-08-28 ENCOUNTER — Other Ambulatory Visit: Payer: Self-pay | Admitting: Family Medicine

## 2013-09-09 ENCOUNTER — Ambulatory Visit (INDEPENDENT_AMBULATORY_CARE_PROVIDER_SITE_OTHER): Payer: Medicaid Other | Admitting: Family Medicine

## 2013-09-09 ENCOUNTER — Encounter: Payer: Self-pay | Admitting: Family Medicine

## 2013-09-09 VITALS — BP 152/111 | HR 91 | Temp 98.1°F | Wt 246.0 lb

## 2013-09-09 DIAGNOSIS — IMO0002 Reserved for concepts with insufficient information to code with codable children: Secondary | ICD-10-CM

## 2013-09-09 DIAGNOSIS — F411 Generalized anxiety disorder: Secondary | ICD-10-CM

## 2013-09-09 DIAGNOSIS — I10 Essential (primary) hypertension: Secondary | ICD-10-CM

## 2013-09-09 DIAGNOSIS — G8929 Other chronic pain: Secondary | ICD-10-CM

## 2013-09-09 DIAGNOSIS — F419 Anxiety disorder, unspecified: Secondary | ICD-10-CM

## 2013-09-09 DIAGNOSIS — F32A Depression, unspecified: Secondary | ICD-10-CM

## 2013-09-09 DIAGNOSIS — F3289 Other specified depressive episodes: Secondary | ICD-10-CM

## 2013-09-09 DIAGNOSIS — M792 Neuralgia and neuritis, unspecified: Secondary | ICD-10-CM

## 2013-09-09 DIAGNOSIS — F329 Major depressive disorder, single episode, unspecified: Secondary | ICD-10-CM

## 2013-09-09 DIAGNOSIS — E669 Obesity, unspecified: Secondary | ICD-10-CM

## 2013-09-09 MED ORDER — NORTRIPTYLINE HCL 50 MG PO CAPS: 50.0000 mg | ORAL_CAPSULE | Freq: Every day | ORAL | Status: DC

## 2013-09-09 MED ORDER — LORAZEPAM 2 MG PO TABS: 2.0000 mg | ORAL_TABLET | Freq: Three times a day (TID) | ORAL | Status: DC | PRN

## 2013-09-09 MED ORDER — GABAPENTIN 300 MG PO CAPS: 900.0000 mg | ORAL_CAPSULE | Freq: Three times a day (TID) | ORAL | Status: DC

## 2013-09-09 NOTE — Assessment & Plan Note (Signed)
Pt to seek further eval by ortho for neck and lower back pain.  Changing pain mgt as not happy w/ [facility]. Pt to have Rx filled one more time at their clinic then transfer to [facility] pain mgt. We will not take over pain mgt.

## 2013-09-09 NOTE — Progress Notes (Signed)
[name] [name] is a 52 y.o. male who presents to [facility] today for f/u   Chronic pain: "done w/ [facility] pain mgt." Cortisone shots in back w/ minimal benefit. H/o siginificant for neck surgery. Would like to go back to Dr. Yevette Edwards at [facility] ortho for possible surgery again. Bilat hand numbness returning.   In home PT coming to the home to see if qualifies for home PT/HH.   HTN: did not take meds this am. No CP, palpitations, SOB.   Constants nagging mild pain in center of chest. No aggrevating or alleviating factors. Non-reproducible on palpation.   Depression: Improving on nortriptyline. Also helping sleep. Denies HI/SI.   Wt down 27 lbs in past year  The following portions of the patient's history were reviewed and updated as appropriate: allergies, current medications, past medical history, family and social history, and problem list.  Patient is a smoker.   Past Medical History  Diagnosis Date  . Constipation due to pain medication   . COPD (chronic obstructive pulmonary disease)   . Status post spinal surgery   . Hypertension   . Anxiety     ROS as above otherwise neg.    Medications reviewed. Current Outpatient Prescriptions  Medication Sig Dispense Refill  . albuterol (PROVENTIL HFA) 108 (90 BASE) MCG/ACT inhaler INHALE TWO PUFFS BY MOUTH INTO LUNGS EVERY 4 HOURS AS NEEDED FOR WHEEZING  7 each  0  . bisacodyl (BISACODYL) 5 MG EC tablet Take 5 mg by mouth daily as needed for constipation.      . butalbital-acetaminophen-caffeine (FIORICET, ESGIC) 50-325-40 MG per tablet Take 1 tablet by mouth 2 (two) times daily as needed for headache.  60 tablet  3  . cyclobenzaprine (FLEXERIL) 5 MG tablet TAKE ONE TABLET BY MOUTH THREE TIMES DAILY AS NEEDED FOR MUSCLE SPASM  90 tablet  3  . diclofenac sodium (VOLTAREN) 1 % GEL Apply 2 g topically daily as needed (pain).       . finasteride (PROSCAR) 5 MG tablet Take 1 tablet (5 mg total) by mouth daily.  30 tablet  3  . fluticasone (FLONASE) 50  MCG/ACT nasal spray USE TWO SPRAY IN EACH NOSTRIL TWICE DAILY AS NEEDED FOR  RHINITIS,  STOP  IF  YOU  DEVELOP  A  NOSE  BLEED  16 g  1  . gabapentin (NEURONTIN) 300 MG capsule Take 3 capsules (900 mg total) by mouth 3 (three) times daily.  270 capsule  3  . hydrochlorothiazide (HYDRODIURIL) 25 MG tablet Take 1 tablet (25 mg total) by mouth daily.  30 tablet  3  . lisinopril (PRINIVIL,ZESTRIL) 20 MG tablet Take 1 tablet (20 mg total) by mouth daily.  90 tablet  3  . LORazepam (ATIVAN) 2 MG tablet Take 1 tablet (2 mg total) by mouth every 8 (eight) hours as needed for anxiety.  90 tablet  0  . nortriptyline (PAMELOR) 50 MG capsule Take 1 capsule (50 mg total) by mouth at bedtime.  30 capsule  3  . oxymorphone (OPANA ER) 40 MG 12 hr tablet Take 40 mg by mouth every 12 (twelve) hours.      Marland Kitchen oxymorphone (OPANA) 10 MG tablet Take 10 mg by mouth every 4 (four) hours as needed for pain.      . phenazopyridine (PYRIDIUM) 100 MG tablet Take 1 tablet (100 mg total) by mouth 3 (three) times daily as needed for pain.  30 tablet  1  . POTASSIUM CHLORIDE PO Take 395 mg by mouth  daily.      . simvastatin (ZOCOR) 20 MG tablet Take 1 tablet (20 mg total) by mouth every evening.  30 tablet  6  . Tamsulosin HCl (FLOMAX) 0.4 MG CAPS Take 2 capsules (0.8 mg total) by mouth daily.  60 capsule  3  . tiotropium (SPIRIVA) 18 MCG inhalation capsule Place 1 capsule (18 mcg total) into inhaler and inhale daily.  30 capsule  12  . traZODone (DESYREL) 100 MG tablet Take 1 tablet (100 mg total) by mouth at bedtime.  30 tablet  6   No current facility-administered medications for this visit.    Exam:  BP 152/111  Pulse 91  Temp(Src) 98.1 F (36.7 C) (Oral)  Wt 246 lb (111.585 kg) Gen: Well NAD HEENT: EOMI,  MMM Spurling's positive on L   No results found for this or any previous visit (from the past 72 hour(s)).   Spent greater than 25 min in direct pt care adn coordination of care

## 2013-09-09 NOTE — Assessment & Plan Note (Signed)
Wt down 27lbs in past 12 mo. Partially due to increased stress and decreased PO. Would like to see more related to diet adn exercise.

## 2013-09-09 NOTE — Patient Instructions (Signed)
I have filed a referral for you to see Dr. Yevette Edwards Please keep your appointments with your current pain clinic until we can get you into another Please try to minimize your anxiety inducing triggers. We will keep you on the increased Ativan for 1-2 months more at most.  Please increase your Nortriptyline to 50mg  at night. THis will help with the depression, anxiety, and pain Please come back to see me in 2 weeks.

## 2013-09-09 NOTE — Assessment & Plan Note (Addendum)
Some improvement since starting Nortriptyline 25 Increase to 50 today F/u in 2 wks

## 2013-09-09 NOTE — Assessment & Plan Note (Signed)
Will change Neurontin from 600 QID to 900 TID

## 2013-09-09 NOTE — Assessment & Plan Note (Signed)
Today's reading not reliable due to not taking meds No change

## 2013-09-09 NOTE — Assessment & Plan Note (Addendum)
Will refill for TID dosing but will not do this further than 1-2 months further. Pt w/ recent significant stressors.  Pt to come in to discuss non pharm methods for coping and improving

## 2013-09-12 ENCOUNTER — Other Ambulatory Visit: Payer: Self-pay

## 2013-09-23 ENCOUNTER — Ambulatory Visit: Payer: Self-pay | Admitting: Family Medicine

## 2013-10-14 ENCOUNTER — Telehealth: Payer: Self-pay | Admitting: Family Medicine

## 2013-10-14 NOTE — Telephone Encounter (Signed)
Patient is having some issues at his pain management clinic and would like to speak to Dr. Konrad Dolores about it. Please call

## 2013-10-15 NOTE — Telephone Encounter (Signed)
Pt called and left voicemail to call back concerning  Shelly Flatten, MD Family Medicine PGY-3 10/15/2013, 1:46 PM

## 2013-10-23 ENCOUNTER — Ambulatory Visit (INDEPENDENT_AMBULATORY_CARE_PROVIDER_SITE_OTHER): Payer: Medicaid Other | Admitting: Family Medicine

## 2013-10-23 ENCOUNTER — Encounter: Payer: Self-pay | Admitting: Family Medicine

## 2013-10-23 VITALS — BP 137/90 | HR 82 | Ht 71.0 in | Wt 249.0 lb

## 2013-10-23 DIAGNOSIS — Z23 Encounter for immunization: Secondary | ICD-10-CM

## 2013-10-23 DIAGNOSIS — F32A Depression, unspecified: Secondary | ICD-10-CM

## 2013-10-23 DIAGNOSIS — F419 Anxiety disorder, unspecified: Secondary | ICD-10-CM

## 2013-10-23 DIAGNOSIS — F329 Major depressive disorder, single episode, unspecified: Secondary | ICD-10-CM

## 2013-10-23 DIAGNOSIS — F411 Generalized anxiety disorder: Secondary | ICD-10-CM

## 2013-10-23 DIAGNOSIS — G8929 Other chronic pain: Secondary | ICD-10-CM

## 2013-10-23 DIAGNOSIS — F3289 Other specified depressive episodes: Secondary | ICD-10-CM

## 2013-10-23 DIAGNOSIS — I10 Essential (primary) hypertension: Secondary | ICD-10-CM

## 2013-10-23 MED ORDER — LORAZEPAM 2 MG PO TABS: 2.0000 mg | ORAL_TABLET | Freq: Three times a day (TID) | ORAL | Status: DC | PRN

## 2013-10-23 MED ORDER — TRAZODONE HCL 100 MG PO TABS: 100.0000 mg | ORAL_TABLET | Freq: Every day | ORAL | Status: DC

## 2013-10-23 MED ORDER — BUTALBITAL-APAP-CAFFEINE 50-325-40 MG PO TABS: 1.0000 | ORAL_TABLET | Freq: Two times a day (BID) | ORAL | Status: DC | PRN

## 2013-10-23 MED ORDER — NORTRIPTYLINE HCL 50 MG PO CAPS: 50.0000 mg | ORAL_CAPSULE | Freq: Every day | ORAL | Status: DC

## 2013-10-23 NOTE — Progress Notes (Signed)
[name] [name] is a 52 y.o. male who presents to [facility] today for pain  Pain: going to [facility] clinic. Went through withdrawal due to pain clinic and [facility] not filling medications. This was due to UDS w/o opioids. These appts were 1-2 weeks after running out of medications. Trying to get in with [facility] pain clinic. Told has to have epidural shots for several months before he will get medications again.   Anxiety/Depression: feeling pretty down due to what is going on in pain mgt clinic. Home life is good w/ [name] [name]. Denies HI/SI. Chronic pain is biggest issue. Taking nortriptyline. Ativan using nearly every [name] [name].   HTN: denies CP, SOB, syncope. Taking medications as prescribed  Tobacco: 1ppd.   The following portions of the patient's history were reviewed and updated as appropriate: allergies, current medications, past medical history, family and social history, and problem list.  Patient is a smoker.  Health Maintenance: Flu shot  Past Medical History  Diagnosis Date  . Constipation due to pain medication   . COPD (chronic obstructive pulmonary disease)   . Status post spinal surgery   . Hypertension   . Anxiety     ROS as above otherwise neg.    Medications reviewed. Current Outpatient Prescriptions  Medication Sig Dispense Refill  . albuterol (PROVENTIL HFA) 108 (90 BASE) MCG/ACT inhaler INHALE TWO PUFFS BY MOUTH INTO LUNGS EVERY 4 HOURS AS NEEDED FOR WHEEZING  7 each  0  . bisacodyl (BISACODYL) 5 MG EC tablet Take 5 mg by mouth daily as needed for constipation.      . butalbital-acetaminophen-caffeine (FIORICET, ESGIC) 50-325-40 MG per tablet Take 1 tablet by mouth 2 (two) times daily as needed for headache.  60 tablet  3  . cyclobenzaprine (FLEXERIL) 5 MG tablet TAKE ONE TABLET BY MOUTH THREE TIMES DAILY AS NEEDED FOR MUSCLE SPASM  90 tablet  3  . diclofenac sodium (VOLTAREN) 1 % GEL Apply 2 g topically daily as needed (pain).       . finasteride (PROSCAR) 5 MG tablet Take 1  tablet (5 mg total) by mouth daily.  30 tablet  3  . fluticasone (FLONASE) 50 MCG/ACT nasal spray USE TWO SPRAY IN EACH NOSTRIL TWICE DAILY AS NEEDED FOR  RHINITIS,  STOP  IF  YOU  DEVELOP  A  NOSE  BLEED  16 g  1  . gabapentin (NEURONTIN) 300 MG capsule Take 3 capsules (900 mg total) by mouth 3 (three) times daily.  270 capsule  3  . hydrochlorothiazide (HYDRODIURIL) 25 MG tablet Take 1 tablet (25 mg total) by mouth daily.  30 tablet  3  . lisinopril (PRINIVIL,ZESTRIL) 20 MG tablet Take 1 tablet (20 mg total) by mouth daily.  90 tablet  3  . LORazepam (ATIVAN) 2 MG tablet Take 1 tablet (2 mg total) by mouth every 8 (eight) hours as needed for anxiety.  90 tablet  0  . nortriptyline (PAMELOR) 50 MG capsule Take 1 capsule (50 mg total) by mouth at bedtime.  90 capsule  3  . oxymorphone (OPANA ER) 40 MG 12 hr tablet Take 40 mg by mouth every 12 (twelve) hours.      Marland Kitchen oxymorphone (OPANA) 10 MG tablet Take 10 mg by mouth every 4 (four) hours as needed for pain.      . phenazopyridine (PYRIDIUM) 100 MG tablet Take 1 tablet (100 mg total) by mouth 3 (three) times daily as needed for pain.  30 tablet  1  .  POTASSIUM CHLORIDE PO Take 395 mg by mouth daily.      . simvastatin (ZOCOR) 20 MG tablet Take 1 tablet (20 mg total) by mouth every evening.  30 tablet  6  . Tamsulosin HCl (FLOMAX) 0.4 MG CAPS Take 2 capsules (0.8 mg total) by mouth daily.  60 capsule  3  . tiotropium (SPIRIVA) 18 MCG inhalation capsule Place 1 capsule (18 mcg total) into inhaler and inhale daily.  30 capsule  12  . traZODone (DESYREL) 100 MG tablet Take 1 tablet (100 mg total) by mouth at bedtime.  90 tablet  3   No current facility-administered medications for this visit.    Exam:  BP 137/90  Pulse 82  Ht 5\' 11"  (1.803 m)  Wt 249 lb (112.946 kg)  BMI 34.74 kg/m2 Gen: Well NAD HEENT: EOMI,  MMM   No results found for this or any previous visit (from the past 72 hour(s)).  A/P (as seen in Problem list)  HTN  (hypertension) Well controlled. No change  Chronic pain Significant issue for pt.  Trying to change to [facility] pain clinic Will call again to see where his referral is in this process  Anxiety Refill ativan, nortriptyline, and trazadone.  Pt under significant stress att this time  Continue Ativan 2mg  TID but will change to BID or QD thereafter.    Depression Refill nortriptyline   Next appt to discuss other medical problems

## 2013-10-23 NOTE — Assessment & Plan Note (Addendum)
Refill ativan, nortriptyline, and trazadone.  Pt under significant stress att this time  Continue Ativan 2mg  TID but will change to BID or QD thereafter.

## 2013-10-23 NOTE — Assessment & Plan Note (Signed)
Well controlled. No change. 

## 2013-10-23 NOTE — Assessment & Plan Note (Signed)
Significant issue for pt.  Trying to change to [facility] pain clinic Will call again to see where his referral is in this process

## 2013-10-23 NOTE — Patient Instructions (Signed)
We have again requested information from [facility] pain management I have refilled your fioricet, ativan, trazadone. This will be the last refill of the Ativan at the increased dose as discussed at your previous visit Please come back to see me in about 4 weeks.

## 2013-10-23 NOTE — Assessment & Plan Note (Signed)
Refill nortriptyline

## 2013-11-11 ENCOUNTER — Telehealth: Payer: Self-pay | Admitting: Family Medicine

## 2013-11-11 NOTE — Telephone Encounter (Signed)
Dr. Marily Memos, Have you received anything yet? Fleeger, Salome Spotted

## 2013-11-11 NOTE — Telephone Encounter (Signed)
Per Dr. Marily Memos he still has not received anything.  LMOVM of Med Rec [phone]) for callback to followup. Fleeger, Salome Spotted

## 2013-11-11 NOTE — Telephone Encounter (Signed)
Patient is wanting to know the status of pain clinic records that should have been sent here a month ago.  He is supposed to be changing pain clinics.  Please call patient.

## 2013-11-13 NOTE — Telephone Encounter (Signed)
LMOVM yesterday and today for call back. Yuri Fana, Salome Spotted

## 2013-11-14 NOTE — Telephone Encounter (Signed)
Called to inform pt of our attempts to get him into pain clinic. No VM option Team to continue to attempt to call pt.  Linna Darner, MD Family Medicine PGY-3 11/14/2013, 1:37 PM

## 2013-11-15 NOTE — Telephone Encounter (Signed)
Pt came into office inquiring about notes from pain clinic.  Advised that I had called and left several messages, and had faxed to [facility] multiple times.  While pt was in clinic I called AGAIN with no success, then called main number @ [facility], was told that they started a new charting system 6 weeks ago and were in the middle of changing locations.  She request that I call med rec again and if I have not heard back from to give her a call Tomasa Hosteller) back.  Also gave me another number to fax [facility] to [phone].  Refaxed [facility] and updated Mr Kinnard.  He was very Patent attorney. Fleeger, Salome Spotted

## 2013-11-15 NOTE — Telephone Encounter (Signed)
Excellent work on a prolonged and difficult situation [name] [name]

## 2013-11-15 NOTE — Telephone Encounter (Signed)
Still have not heard from Med [facility] @ [facility].   Tomasa Hosteller (receptionist @ [facility]) has sent email to med rec requesting update. Fleeger, Salome Spotted

## 2013-11-19 ENCOUNTER — Ambulatory Visit: Payer: Self-pay | Admitting: Family Medicine

## 2013-11-20 ENCOUNTER — Encounter: Payer: Self-pay | Admitting: Family Medicine

## 2013-11-20 ENCOUNTER — Ambulatory Visit (INDEPENDENT_AMBULATORY_CARE_PROVIDER_SITE_OTHER): Payer: Medicaid Other | Admitting: Family Medicine

## 2013-11-20 VITALS — BP 158/88 | HR 98 | Temp 98.8°F | Wt 262.0 lb

## 2013-11-20 DIAGNOSIS — F3289 Other specified depressive episodes: Secondary | ICD-10-CM

## 2013-11-20 DIAGNOSIS — F329 Major depressive disorder, single episode, unspecified: Secondary | ICD-10-CM

## 2013-11-20 DIAGNOSIS — F419 Anxiety disorder, unspecified: Secondary | ICD-10-CM

## 2013-11-20 DIAGNOSIS — Z720 Tobacco use: Secondary | ICD-10-CM

## 2013-11-20 DIAGNOSIS — G8929 Other chronic pain: Secondary | ICD-10-CM

## 2013-11-20 DIAGNOSIS — F411 Generalized anxiety disorder: Secondary | ICD-10-CM

## 2013-11-20 DIAGNOSIS — F17200 Nicotine dependence, unspecified, uncomplicated: Secondary | ICD-10-CM

## 2013-11-20 DIAGNOSIS — I10 Essential (primary) hypertension: Secondary | ICD-10-CM

## 2013-11-20 DIAGNOSIS — J449 Chronic obstructive pulmonary disease, unspecified: Secondary | ICD-10-CM

## 2013-11-20 DIAGNOSIS — E669 Obesity, unspecified: Secondary | ICD-10-CM

## 2013-11-20 DIAGNOSIS — F32A Depression, unspecified: Secondary | ICD-10-CM

## 2013-11-20 LAB — POCT GLYCOSYLATED HEMOGLOBIN (HGB A1C): Hemoglobin A1C: 5.3

## 2013-11-20 MED ORDER — CYCLOBENZAPRINE HCL 5 MG PO TABS: ORAL_TABLET | ORAL | Status: DC

## 2013-11-20 MED ORDER — LORAZEPAM 2 MG PO TABS: 2.0000 mg | ORAL_TABLET | Freq: Two times a day (BID) | ORAL | Status: DC | PRN

## 2013-11-20 MED ORDER — GABAPENTIN 300 MG PO CAPS
900.0000 mg | ORAL_CAPSULE | Freq: Three times a day (TID) | ORAL | Status: DC
Start: 2013-11-20 — End: 2014-01-03

## 2013-11-20 NOTE — Assessment & Plan Note (Signed)
Improving. Refill ativan fot BID dosing as previously agreed instead of TID.

## 2013-11-20 NOTE — Assessment & Plan Note (Signed)
At home [facility] psych counseling Continue current regimen

## 2013-11-20 NOTE — Telephone Encounter (Signed)
Attempted again to call and have records faxed over.  The mailbox is full.  Will forward to MD for Valentina Shaggy, Salome Spotted

## 2013-11-20 NOTE — Assessment & Plan Note (Signed)
Elevated today likely due to pain.

## 2013-11-20 NOTE — Patient Instructions (Signed)
Thank you for coming in today You are doing well overall Please call the ortho office to reschedule your appointment for surgical consultation Please continue to try to cut back on the Ativan Please conitnue meeting with the psychologist

## 2013-11-20 NOTE — Assessment & Plan Note (Signed)
Pt to call Ortho to reschedule appt that he canceled. I feel that surgical options may be his only hope for improvement

## 2013-11-20 NOTE — Assessment & Plan Note (Signed)
No recent flares Trying to quit

## 2013-11-20 NOTE — Progress Notes (Signed)
[name] [name] is a 53 y.o. male who presents to [facility] today for f/u and med refill  Pain: still at [facility] pain clinic but receiving only the Opana ER 40mg , morphine sulfate 15mg  BID PRN breakthrough. Now receiving epidural shots w/o benefit. Pt did not keep ortho appt. But pt has been told before that his insurance was not accepted for the surgery.   Depression/anxiety: now w/ home psychologist making wkly visits. At baseline. No HI/SI. [name] [name] still locking up and dispensing meds.   Tobacco: 1pp [place]. Starting to inc vapor cig.   The following portions of the patient's history were reviewed and updated as appropriate: allergies, current medications, past medical history, family and social history, and problem list.  Patient is a smoker.   Health Maintenance: none today  Past Medical History  Diagnosis Date  . Constipation due to pain medication   . COPD (chronic obstructive pulmonary disease)   . Status post spinal surgery   . Hypertension   . Anxiety     ROS as above otherwise neg.    Medications reviewed. Current Outpatient Prescriptions  Medication Sig Dispense Refill  . albuterol (PROVENTIL HFA) 108 (90 BASE) MCG/ACT inhaler INHALE TWO PUFFS BY MOUTH INTO LUNGS EVERY 4 HOURS AS NEEDED FOR WHEEZING  7 each  0  . bisacodyl (BISACODYL) 5 MG EC tablet Take 5 mg by mouth daily as needed for constipation.      . butalbital-acetaminophen-caffeine (FIORICET, ESGIC) 50-325-40 MG per tablet Take 1 tablet by mouth 2 (two) times daily as needed for headache.  60 tablet  3  . cyclobenzaprine (FLEXERIL) 5 MG tablet TAKE ONE TABLET BY MOUTH THREE TIMES DAILY AS NEEDED FOR MUSCLE SPASM  90 tablet  3  . diclofenac sodium (VOLTAREN) 1 % GEL Apply 2 g topically daily as needed (pain).       . finasteride (PROSCAR) 5 MG tablet Take 1 tablet (5 mg total) by mouth daily.  30 tablet  3  . fluticasone (FLONASE) 50 MCG/ACT nasal spray USE TWO SPRAY IN EACH NOSTRIL TWICE DAILY AS NEEDED FOR  RHINITIS,  STOP  IF   YOU  DEVELOP  A  NOSE  BLEED  16 g  1  . gabapentin (NEURONTIN) 300 MG capsule Take 3 capsules (900 mg total) by mouth 3 (three) times daily.  270 capsule  3  . hydrochlorothiazide (HYDRODIURIL) 25 MG tablet Take 1 tablet (25 mg total) by mouth daily.  30 tablet  3  . lisinopril (PRINIVIL,ZESTRIL) 20 MG tablet Take 1 tablet (20 mg total) by mouth daily.  90 tablet  3  . LORazepam (ATIVAN) 2 MG tablet Take 1 tablet (2 mg total) by mouth 2 (two) times daily as needed for anxiety.  60 tablet  2  . nortriptyline (PAMELOR) 50 MG capsule Take 1 capsule (50 mg total) by mouth at bedtime.  90 capsule  3  . oxymorphone (OPANA ER) 40 MG 12 hr tablet Take 40 mg by mouth every 12 (twelve) hours.      Marland Kitchen oxymorphone (OPANA) 10 MG tablet Take 10 mg by mouth every 4 (four) hours as needed for pain.      . phenazopyridine (PYRIDIUM) 100 MG tablet Take 1 tablet (100 mg total) by mouth 3 (three) times daily as needed for pain.  30 tablet  1  . POTASSIUM CHLORIDE PO Take 395 mg by mouth daily.      . simvastatin (ZOCOR) 20 MG tablet Take 1 tablet (20 mg total) by mouth  every evening.  30 tablet  6  . Tamsulosin HCl (FLOMAX) 0.4 MG CAPS Take 2 capsules (0.8 mg total) by mouth daily.  60 capsule  3  . tiotropium (SPIRIVA) 18 MCG inhalation capsule Place 1 capsule (18 mcg total) into inhaler and inhale daily.  30 capsule  12  . traZODone (DESYREL) 100 MG tablet Take 1 tablet (100 mg total) by mouth at bedtime.  90 tablet  3   No current facility-administered medications for this visit.    Exam:  BP 158/88  Pulse 98  Temp(Src) 98.8 F (37.1 C) (Oral)  Wt 262 lb (118.842 kg) Gen: Well NAD HEENT: EOMI,  MMM   No results found for this or any previous visit (from the past 72 hour(s)).  A/P (as seen in Problem list)  COPD (chronic obstructive pulmonary disease) No recent flares Trying to quit  Tobacco abuse Trying to decrease tobacco use. Using more of ecig   Depression At home [facility] psych  counseling Continue current regimen  Chronic pain Pt to call Ortho to reschedule appt that he canceled. I feel that surgical options may be his only hope for improvement  Anxiety Improving. Refill ativan fot BID dosing as previously agreed instead of TID.   HTN (hypertension) Elevated today likely due to pain.

## 2013-11-20 NOTE — Assessment & Plan Note (Signed)
Trying to decrease tobacco use. Using more of ecig

## 2013-12-11 ENCOUNTER — Other Ambulatory Visit: Payer: Self-pay | Admitting: Family Medicine

## 2013-12-11 DIAGNOSIS — R350 Frequency of micturition: Secondary | ICD-10-CM

## 2013-12-11 MED ORDER — TAMSULOSIN HCL 0.4 MG PO CAPS: 0.8000 mg | ORAL_CAPSULE | Freq: Every day | ORAL | Status: DC

## 2013-12-30 ENCOUNTER — Other Ambulatory Visit: Payer: Self-pay | Admitting: Family Medicine

## 2013-12-30 ENCOUNTER — Encounter: Payer: Self-pay | Admitting: Family Medicine

## 2013-12-30 DIAGNOSIS — G8929 Other chronic pain: Secondary | ICD-10-CM

## 2014-01-03 MED ORDER — GABAPENTIN 300 MG PO CAPS: 900.0000 mg | ORAL_CAPSULE | Freq: Three times a day (TID) | ORAL | Status: DC

## 2014-02-12 ENCOUNTER — Ambulatory Visit: Payer: Self-pay | Admitting: Family Medicine

## 2014-02-14 ENCOUNTER — Emergency Department (HOSPITAL_COMMUNITY): Payer: Medicaid Other

## 2014-02-14 ENCOUNTER — Encounter (HOSPITAL_COMMUNITY): Payer: Self-pay | Admitting: Emergency Medicine

## 2014-02-14 ENCOUNTER — Other Ambulatory Visit: Payer: Self-pay

## 2014-02-14 ENCOUNTER — Inpatient Hospital Stay (HOSPITAL_COMMUNITY): Payer: Medicaid Other

## 2014-02-14 ENCOUNTER — Inpatient Hospital Stay (HOSPITAL_COMMUNITY)
Admission: EM | Admit: 2014-02-14 | Discharge: 2014-02-16 | DRG: 565 | Disposition: A | Discharge status: Home Health | Payer: Medicaid Other | Attending: Internal Medicine | Admitting: Internal Medicine

## 2014-02-14 DIAGNOSIS — F17200 Nicotine dependence, unspecified, uncomplicated: Secondary | ICD-10-CM | POA: Diagnosis present

## 2014-02-14 DIAGNOSIS — M216X9 Other acquired deformities of unspecified foot: Principal | ICD-10-CM

## 2014-02-14 DIAGNOSIS — E860 Dehydration: Secondary | ICD-10-CM | POA: Diagnosis present

## 2014-02-14 DIAGNOSIS — M545 Low back pain, unspecified: Secondary | ICD-10-CM | POA: Diagnosis present

## 2014-02-14 DIAGNOSIS — N179 Acute kidney failure, unspecified: Secondary | ICD-10-CM

## 2014-02-14 DIAGNOSIS — J449 Chronic obstructive pulmonary disease, unspecified: Secondary | ICD-10-CM | POA: Diagnosis present

## 2014-02-14 DIAGNOSIS — F329 Major depressive disorder, single episode, unspecified: Secondary | ICD-10-CM | POA: Diagnosis present

## 2014-02-14 DIAGNOSIS — E785 Hyperlipidemia, unspecified: Secondary | ICD-10-CM | POA: Diagnosis present

## 2014-02-14 DIAGNOSIS — Y92009 Unspecified place in unspecified non-institutional (private) residence as the place of occurrence of the external cause: Secondary | ICD-10-CM

## 2014-02-14 DIAGNOSIS — E876 Hypokalemia: Secondary | ICD-10-CM | POA: Diagnosis present

## 2014-02-14 DIAGNOSIS — F419 Anxiety disorder, unspecified: Secondary | ICD-10-CM | POA: Diagnosis present

## 2014-02-14 DIAGNOSIS — G8929 Other chronic pain: Secondary | ICD-10-CM | POA: Diagnosis present

## 2014-02-14 DIAGNOSIS — I10 Essential (primary) hypertension: Secondary | ICD-10-CM | POA: Diagnosis present

## 2014-02-14 DIAGNOSIS — M5430 Sciatica, unspecified side: Secondary | ICD-10-CM | POA: Diagnosis present

## 2014-02-14 DIAGNOSIS — R339 Retention of urine, unspecified: Secondary | ICD-10-CM | POA: Diagnosis present

## 2014-02-14 DIAGNOSIS — R209 Unspecified disturbances of skin sensation: Secondary | ICD-10-CM | POA: Diagnosis present

## 2014-02-14 DIAGNOSIS — J4489 Other specified chronic obstructive pulmonary disease: Secondary | ICD-10-CM

## 2014-02-14 DIAGNOSIS — D72829 Elevated white blood cell count, unspecified: Secondary | ICD-10-CM | POA: Diagnosis present

## 2014-02-14 DIAGNOSIS — F32A Depression, unspecified: Secondary | ICD-10-CM | POA: Diagnosis present

## 2014-02-14 DIAGNOSIS — W19XXXA Unspecified fall, initial encounter: Secondary | ICD-10-CM

## 2014-02-14 DIAGNOSIS — M21372 Foot drop, left foot: Secondary | ICD-10-CM | POA: Diagnosis present

## 2014-02-14 DIAGNOSIS — R404 Transient alteration of awareness: Secondary | ICD-10-CM | POA: Diagnosis present

## 2014-02-14 LAB — CBC WITH DIFFERENTIAL/PLATELET
Basophils Absolute: 0 10*3/uL (ref 0.0–0.1)
Basophils Relative: 0 % (ref 0–1)
EOS PCT: 0 % (ref 0–5)
Eosinophils Absolute: 0 10*3/uL (ref 0.0–0.7)
HCT: 41.6 % (ref 39.0–52.0)
Hemoglobin: 15 g/dL (ref 13.0–17.0)
LYMPHS ABS: 2.6 10*3/uL (ref 0.7–4.0)
Lymphocytes Relative: 17 % (ref 12–46)
MCH: 33.4 pg (ref 26.0–34.0)
MCHC: 36.1 g/dL — ABNORMAL HIGH (ref 30.0–36.0)
MCV: 92.7 fL (ref 78.0–100.0)
MONO ABS: 0.8 10*3/uL (ref 0.1–1.0)
Monocytes Relative: 5 % (ref 3–12)
Neutro Abs: 12.2 10*3/uL — ABNORMAL HIGH (ref 1.7–7.7)
Neutrophils Relative %: 78 % — ABNORMAL HIGH (ref 43–77)
Platelets: 182 10*3/uL (ref 150–400)
RBC: 4.49 MIL/uL (ref 4.22–5.81)
RDW: 12.5 % (ref 11.5–15.5)
WBC: 15.6 10*3/uL — AB (ref 4.0–10.5)

## 2014-02-14 LAB — SODIUM, URINE, RANDOM: Sodium, Ur: <20 mEq/L

## 2014-02-14 LAB — URINALYSIS, ROUTINE W REFLEX MICROSCOPIC
BILIRUBIN URINE: NEGATIVE
GLUCOSE, UA: NEGATIVE mg/dL
KETONES UR: NEGATIVE mg/dL
LEUKOCYTES UA: NEGATIVE
Nitrite: NEGATIVE
PH: 5 (ref 5.0–8.0)
Protein, ur: NEGATIVE mg/dL
Specific Gravity, Urine: 1.015 (ref 1.005–1.030)
Urobilinogen, UA: 0.2 mg/dL (ref 0.0–1.0)

## 2014-02-14 LAB — URINE MICROSCOPIC-ADD ON

## 2014-02-14 LAB — I-STAT CHEM 8, ED
BUN: 52 mg/dL — AB (ref 6–23)
CHLORIDE: 97 meq/L (ref 96–112)
Calcium, Ion: 0.96 mmol/L — ABNORMAL LOW (ref 1.12–1.23)
Creatinine, Ser: 3.8 mg/dL — ABNORMAL HIGH (ref 0.50–1.35)
GLUCOSE: 86 mg/dL (ref 70–99)
HCT: 47 % (ref 39.0–52.0)
Hemoglobin: 16 g/dL (ref 13.0–17.0)
Potassium: 3.4 mEq/L — ABNORMAL LOW (ref 3.7–5.3)
Sodium: 131 mEq/L — ABNORMAL LOW (ref 137–147)
TCO2: 22 mmol/L (ref 0–100)

## 2014-02-14 LAB — CREATININE, URINE, RANDOM: Creatinine, Urine: 135.62 mg/dL

## 2014-02-14 LAB — I-STAT TROPONIN, ED: Troponin i, poc: 0.01 ng/mL (ref 0.00–0.08)

## 2014-02-14 MED ORDER — SODIUM CHLORIDE 0.9 % IV SOLN
INTRAVENOUS | Status: DC
  Administered 2014-02-15 (×2): via INTRAVENOUS

## 2014-02-14 MED ORDER — HYDROMORPHONE HCL PF 1 MG/ML IJ SOLN
1.0000 mg | INTRAMUSCULAR | Status: DC | PRN
  Administered 2014-02-14 – 2014-02-16 (×4): 1 mg via INTRAVENOUS
  Filled 2014-02-14 (×4): qty 1

## 2014-02-14 MED ORDER — ALBUTEROL SULFATE (2.5 MG/3ML) 0.083% IN NEBU: 2.5000 mg | INHALATION_SOLUTION | RESPIRATORY_TRACT | Status: DC | PRN

## 2014-02-14 MED ORDER — HYDROCODONE-ACETAMINOPHEN 5-325 MG PO TABS
2.0000 | ORAL_TABLET | Freq: Once | ORAL | Status: AC
  Administered 2014-02-14: 2 via ORAL
  Filled 2014-02-14: qty 2

## 2014-02-14 MED ORDER — GABAPENTIN 300 MG PO CAPS
900.0000 mg | ORAL_CAPSULE | Freq: Three times a day (TID) | ORAL | Status: DC
  Administered 2014-02-14 – 2014-02-16 (×5): 900 mg via ORAL
  Filled 2014-02-14 (×7): qty 3

## 2014-02-14 MED ORDER — SODIUM CHLORIDE 0.9 % IV BOLUS (SEPSIS)
1000.0000 mL | Freq: Once | INTRAVENOUS | Status: AC
  Administered 2014-02-14: 1000 mL via INTRAVENOUS

## 2014-02-14 MED ORDER — TAMSULOSIN HCL 0.4 MG PO CAPS
0.8000 mg | ORAL_CAPSULE | Freq: Every day | ORAL | Status: DC
  Administered 2014-02-15 – 2014-02-16 (×2): 0.8 mg via ORAL
  Filled 2014-02-14 (×2): qty 2

## 2014-02-14 MED ORDER — BUTALBITAL-APAP-CAFFEINE 50-325-40 MG PO TABS: 1.0000 | ORAL_TABLET | Freq: Two times a day (BID) | ORAL | Status: DC | PRN

## 2014-02-14 MED ORDER — TIOTROPIUM BROMIDE MONOHYDRATE 18 MCG IN CAPS
18.0000 ug | ORAL_CAPSULE | Freq: Every day | RESPIRATORY_TRACT | Status: DC
  Administered 2014-02-15 – 2014-02-16 (×2): 18 ug via RESPIRATORY_TRACT
  Filled 2014-02-14: qty 5

## 2014-02-14 MED ORDER — SIMVASTATIN 20 MG PO TABS
20.0000 mg | ORAL_TABLET | Freq: Every evening | ORAL | Status: DC
  Administered 2014-02-14 – 2014-02-15 (×2): 20 mg via ORAL
  Filled 2014-02-14 (×3): qty 1

## 2014-02-14 MED ORDER — ONDANSETRON HCL 4 MG PO TABS: 4.0000 mg | ORAL_TABLET | Freq: Four times a day (QID) | ORAL | Status: DC | PRN

## 2014-02-14 MED ORDER — SODIUM CHLORIDE 0.9 % IJ SOLN
3.0000 mL | Freq: Two times a day (BID) | INTRAMUSCULAR | Status: DC
  Administered 2014-02-15 – 2014-02-16 (×2): 3 mL via INTRAVENOUS

## 2014-02-14 MED ORDER — ONDANSETRON HCL 4 MG/2ML IJ SOLN: 4.0000 mg | Freq: Four times a day (QID) | INTRAMUSCULAR | Status: DC | PRN

## 2014-02-14 MED ORDER — HYDROCODONE-ACETAMINOPHEN 5-325 MG PO TABS
1.0000 | ORAL_TABLET | ORAL | Status: DC | PRN
  Administered 2014-02-15 – 2014-02-16 (×4): 2 via ORAL
  Filled 2014-02-14 (×4): qty 2

## 2014-02-14 MED ORDER — ENOXAPARIN SODIUM 40 MG/0.4ML ~~LOC~~ SOLN
40.0000 mg | SUBCUTANEOUS | Status: DC
Start: 2014-02-15 — End: 2014-02-15
  Administered 2014-02-15: 40 mg via SUBCUTANEOUS
  Filled 2014-02-14: qty 0.4

## 2014-02-14 MED ORDER — TRAZODONE HCL 100 MG PO TABS
100.0000 mg | ORAL_TABLET | Freq: Every day | ORAL | Status: DC
  Administered 2014-02-14 – 2014-02-15 (×2): 100 mg via ORAL
  Filled 2014-02-14 (×3): qty 1

## 2014-02-14 NOTE — ED Notes (Signed)
Pt in via EMS to [place], states he fell three days ago and hit his head with possible LOC, hx of sciatica, denies neck pain, increased pain since fall, pt BP 152/104 en route, denies neurological symptoms

## 2014-02-14 NOTE — ED Notes (Addendum)
Fell 3 days ago and experienced LOC. PT reports hitting neck during fall. States the fall induced sciatica on L side (states hx of bilat sciatica). States he can't feel L foot and states it is useless/numb. Also reports numbness on R scalp. PT has 5cmx3cm abrasion to R forehead and hematoma @ s-spine area

## 2014-02-14 NOTE — ED Notes (Signed)
He states 3 days ago he was getting up from his computer chair and was having severe sciatica pain and because of the pain he fell. He c/o increased L leg pain and numbness, and head pain since. He has an abrasion to his forehead and states "i think i might have passed out." he is a&ox4 now, breathing easily.

## 2014-02-14 NOTE — H&P (Addendum)
[facility] History and Physical  Nilan Cockerill UXN:[id number] DOB: [birth year] DOA: 02/14/2014  Referring physician: ER physician PCP: Linna Darner, MD   Chief Complaint: fall  HPI:  Pt is 53 yo male with history of HTN, HLD, COPD, depression, sciatica, presenting to [facility] ED with main concern of several days duration of sudden onset of left foot drop that started after an episode of fall at home.  Pt explains his legs gave away and he felt lightheaded and fell hitting his back and neck, head with brief LOC but he is unable to provide more details.  He explains he also has low back pain, throbbing and constant, radiating to posterior left thigh, 5/10 in severity. No specific alleviating or aggravating factors. Pt reports similar event of low back pain in the past but not as severe and never associated with foot droop. Pt denies fevers, chills, no specific abdominal or urinary concerns, no chest pain or shortness of breath.   In ED, pt hemodynamically stable, noted to have new onset renal failure with Cr > 3, vitals notable for soft SBP on admission in 90 - 100's/ [facility] asked to admit to telemetry bed for further evaluation.   Assessment and Plan:  Principal Problem:   Fall - etiology unclear, ? Syncopal event - will admit to telemetry bed - check orthostatic vitals, 12 lead EKG, TSH - also hold antihypertensive medications Lisinopril and HCTZ due to soft BP and place on IVF - PT evaluation requested - MRI lumbar spine pending  Active Problems:   Left foot drop - also unclear etiology - follow up on MRI lumbar spine    HTN (hypertension) - BP soft on admission - hold Lisinopril and HCTZ   Hyperlipidemia - continue statin    COPD (chronic obstructive pulmonary disease) - clinically compensated - provide oxygen if needed    Acute renal failure - possibly pre renal and exacerbated by use of ACEI and HCTZ - hold both medications - check urine sodium and urine creatinine to  determine FENa - place on IVF and repeat BMP in AM   Hypokalemia - mild, will supplement and repeat BMP in AM   Leukocytosis - unclear etiology and ? Reactive from fall and dehydration - will check UA  - hold off on ABX for now until an infectious etiology established - repeat CBC in AM  Radiological Exams on Admission: Ct Head Wo Contrast 02/14/2014    IMPRESSION: 1. Normal noncontrast CT scan of the brain. 2. There are postfusion changes at C5-6 and C6-7. There is mild degenerative disc bulging at C3-4. There is no evidence of an acute cervical spine fracture nor dislocation.     Ct Cervical Spine Wo Contrast 02/14/2014     IMPRESSION: 1. Normal noncontrast CT scan of the brain. 2. There are postfusion changes at C5-6 and C6-7. There is mild degenerative disc bulging at C3-4. There is no evidence of an acute cervical spine fracture nor dislocation.   Dg Chest Port 1 View 02/14/2014     IMPRESSION: Minimal right lung base atelectasis versus scarring.      Code Status: Full Family Communication: Pt at bedside Disposition Plan: Admit for further evaluation  Robbie Lis, MD  [facility] Pager [phone]  Review of Systems:  Constitutional: Negative for fever, chills and malaise/fatigue. Negative for diaphoresis.  HENT: Negative for hearing loss, ear pain, nosebleeds, congestion, sore throat, neck pain, tinnitus and ear discharge.   Eyes: Negative for blurred vision, double vision, photophobia, pain,  discharge and redness.  Respiratory: Negative for cough, hemoptysis, sputum production, shortness of breath, wheezing and stridor.   Cardiovascular: Negative for chest pain, palpitations, orthopnea, claudication and leg swelling.  Gastrointestinal: Negative for nausea, vomiting and abdominal pain. Negative for heartburn, constipation, blood in stool and melena.  Genitourinary: Negative for dysuria, urgency, frequency, hematuria and flank pain.  Musculoskeletal: Negative for myalgias,  back pain, joint pain and falls.  Skin: Negative for itching and rash.  Neurological: Negative for dizziness and weakness. Negative for tingling, tremors, sensory change, speech change, focal weakness, loss of consciousness and headaches.  Endo/Heme/Allergies: Negative for environmental allergies and polydipsia. Does not bruise/bleed easily.  Psychiatric/Behavioral: Negative for suicidal ideas. The patient is not nervous/anxious.      Past Medical History  Diagnosis Date  . Constipation due to pain medication   . COPD (chronic obstructive pulmonary disease)   . Status post spinal surgery   . Hypertension   . Anxiety    Past Surgical History  Procedure Laterality Date  . Spinal fixation surgery     Social History:  reports that he has been smoking Cigarettes.  He has been smoking about 1.00 pack per [place]. He does not have any smokeless tobacco history on file. He reports that he does not use illicit drugs. His alcohol history is not on file.  Allergies  Allergen Reactions  . Fentanyl     Per pt he had a near overdose on these due to "regulating body temp"  . Methadone Itching    Family History  Problem Relation Age of Onset  . Cancer Lymph node cancer      Prior to Admission medications   Medication Sig Start Date End Date Taking? Authorizing Provider  albuterol (PROVENTIL HFA) 108 (90 BASE) MCG/ACT inhaler INHALE TWO PUFFS BY MOUTH INTO LUNGS EVERY 4 HOURS AS NEEDED FOR WHEEZING 07/18/13  Yes Waldemar [name], MD  butalbital-acetaminophen-caffeine (FIORICET, ESGIC) [phone] MG per tablet Take 1 tablet by mouth 2 (two) times daily as needed for headache. 10/23/13  Yes Waldemar [name], MD  gabapentin (NEURONTIN) 300 MG capsule Take 3 capsules (900 mg total) by mouth 3 (three) times daily. 01/03/14  Yes Waldemar [name], MD  hydrochlorothiazide (HYDRODIURIL) 25 MG tablet Take 1 tablet (25 mg total) by mouth daily. 06/17/13 06/17/14 Yes Waldemar [name], MD  lisinopril (PRINIVIL,ZESTRIL)  20 MG tablet Take 1 tablet (20 mg total) by mouth daily. 06/17/13 06/17/14 Yes Waldemar [name], MD  simvastatin (ZOCOR) 20 MG tablet Take 1 tablet (20 mg total) by mouth every evening. 04/22/13 04/22/14 Yes Waldemar [name], MD  tamsulosin (FLOMAX) 0.4 MG CAPS capsule Take 2 capsules (0.8 mg total) by mouth daily. 12/11/13  Yes Waldemar [name], MD  tiotropium (SPIRIVA) 18 MCG inhalation capsule Place 1 capsule (18 mcg total) into inhaler and inhale daily. 12/25/12 02/14/14 Yes Waldemar [name], MD  traZODone (DESYREL) 100 MG tablet Take 1 tablet (100 mg total) by mouth at bedtime. 10/23/13 02/26/00 Yes Waldemar [name], MD   Physical Exam: Filed Vitals:   02/14/14 1605 02/14/14 1800 02/14/14 1845  BP: 135/119 114/69 107/64  Pulse: 94 99 97  Temp: 98.1 F (36.7 C)    TempSrc: Oral    Resp: 16 26 19   SpO2: 98% 96% 97%    Physical Exam  Constitutional: Appears well-developed and well-nourished. No distress. little hematoma on forehead HENT: Normocephalic. External right and left ear normal. Oropharynx is clear and moist.  Eyes: Conjunctivae and EOM  are normal. PERRLA, no scleral icterus.  Neck: Normal ROM. Neck supple. No JVD. No tracheal deviation. No thyromegaly.  CVS: RRR, S1/S2 +, no murmurs, no gallops, no carotid bruit.  Pulmonary: Effort and breath sounds normal, no stridor, rhonchi, wheezes, rales.  Abdominal: Soft. BS +,  no distension, tenderness, rebound or guarding.  Musculoskeletal: Normal range of motion. No edema and no tenderness.  Lymphadenopathy: No lymphadenopathy noted, cervical, inguinal. Neuro: Alert. Unable to move the left foot but able to lift left leg Skin: Skin is warm and dry. No rash noted. Not diaphoretic. No erythema. No pallor.  Psychiatric: Normal mood and affect. Behavior, judgment, thought content normal.   Labs on Admission:  Basic Metabolic Panel:  Recent Labs Lab 02/14/14 1930  NA 131*  K 3.4*  CL 97  GLUCOSE 86  BUN 52*  CREATININE 3.80*    CBC:  Recent Labs Lab 02/14/14 1930 02/14/14 1937  WBC  --  15.6*  NEUTROABS  --  12.2*  HGB 16.0 15.0  HCT 47.0 41.6  MCV  --  92.7  PLT  --  182   If 7PM-7AM, please contact night-coverage [email or web address] Password [email or web address] 02/14/2014, 8:09 PM

## 2014-02-14 NOTE — ED Notes (Signed)
Discussed with deborah that we'd do the in/out cath and admin the dilaudid once PT gets back from MRI and will then bring him to the floor

## 2014-02-14 NOTE — Consult Note (Addendum)
NEURO HOSPITALIST CONSULT NOTE    Reason for Consult: left foot droop/pain-numbness  HPI:                                                                                                                                          [name] [name] is an 53 y.o. male with a past medical history significant for HTN, COPD, anxiety, s/p cervical spine fusion, chronic low back pain, sciatica, admitted to [facility] after sustaining a fall at home with ? probable syncopal episode and the above stated symptoms. He indicated that he has a history of bilateral sciatica and low back problems for many years, and typically is able to walk with a walker. However, 3 days ago he attempted to walk without his walker, her legs buckled up, and he fell and had a transient loss of consciousness. He said that he hid his neck and ever since has been experiencing worsening daily pain in his left buttock traveling to his foot, with associated numbness traveling down to almost the whole foot but mainly the dorsum of the foot as well as the big toe and the inner aspect of the foot. In addition, he stated that he can not move the left foot in any direction. No bladder and bowel disturbances. No HA, vertigo, double vision, difficulty swallowing, slurred speech, language or vision impairment. MRI L-spine tonight showed " T12-L1 shallow left posterior lateral protrusion. Slight indentation left lateral aspect of the thecal sac. Minimal crowding of adjacent nerve roots. L4-5 bulge which centrally touches but does not cause significant compression of the thecal sac. Left foraminal/lateral disc osteophyte with slight encroachment upon but not significant compression of the exiting left L4 nerve root. L5-S1 broad-based disc osteophyte complex. Centrally no compression of the S1 nerve roots or thecal sac. Laterally, extension greater on the right with mild encroachment upon the exiting right L5 nerve root". [name] tells me that he was  under the care of a pain medicine specialist but stopped seeing him " because some disagreements" in the way he was expected to receive medical care. In this regard, he expressed he had had epidural injections x 3 without symptomatic benefit but narcotic treatment was helpful.        Past Medical History  Diagnosis Date  . Constipation due to pain medication   . COPD (chronic obstructive pulmonary disease)   . Status post spinal surgery   . Hypertension   . Anxiety     Past Surgical History  Procedure Laterality Date  . Spinal fixation surgery      Family History  Problem Relation Age of Onset  . Cancer Neg Hx     Social History:  reports that he has been smoking Cigarettes.  He has been smoking about 1.00 pack  per [place]. He does not have any smokeless tobacco history on file. He reports that he does not use illicit drugs. His alcohol history is not on file.  Allergies  Allergen Reactions  . Fentanyl     Per pt he had a near overdose on these due to "regulating body temp"  . Methadone Itching    MEDICATIONS:                                                                                                                     I have reviewed the patient's current medications.   ROS:                                                                                                                                       History obtained from the patient and chart review.  General ROS: negative for - chills, fatigue, fever, night sweats, or weight loss Psychological ROS: negative for - behavioral disorder, hallucinations, memory difficulties, mood swings or suicidal ideation Ophthalmic ROS: negative for - blurry vision, double vision, eye pain or loss of vision ENT ROS: negative for - epistaxis, nasal discharge, oral lesions, sore throat, tinnitus or vertigo Allergy and Immunology ROS: negative for - hives or itchy/watery eyes Hematological and Lymphatic ROS: negative for - bleeding  problems, bruising or swollen lymph nodes Endocrine ROS: negative for - galactorrhea, hair pattern changes, polydipsia/polyuria or temperature intolerance Respiratory ROS: negative for - cough, hemoptysis, shortness of breath or wheezing Cardiovascular ROS: negative for - chest pain, dyspnea on exertion, edema or irregular heartbeat Gastrointestinal ROS: negative for - abdominal pain, diarrhea, hematemesis, nausea/vomiting or stool incontinence Genito-Urinary ROS: negative for - dysuria, hematuria, incontinence or urinary frequency/urgency Musculoskeletal ROS: negative for - joint swelling Neurological ROS: as noted in HPI Dermatological ROS: negative for rash and skin lesion changes   Physical exam: pleasant male in no apparent distress at this moment. Blood pressure 108/73, pulse 100, temperature 98.9 F (37.2 C), temperature source Oral, resp. rate 18, height 5\' 11"  (1.803 m), SpO2 94.00%. Head: normocephalic. Neck: supple, no bruits, no JVD. Cardiac: no murmurs. Lungs: clear. Abdomen: soft, no tender, no mass. Extremities: no edema. CV: pulses palpable throughout  Neurologic Examination:  Mental Status: Alert, oriented, thought content appropriate.  Speech fluent without evidence of aphasia.  Able to follow 3 step commands without difficulty. Cranial Nerves: II: Discs flat bilaterally; Visual fields grossly normal, pupils equal, round, reactive to light and accommodation III,IV, VI: ptosis not present, extra-ocular motions intact bilaterally V,VII: smile symmetric, facial light touch sensation normal bilaterally VIII: hearing normal bilaterally IX,X: gag reflex present XI: bilateral shoulder shrug XII: midline tongue extension without atrophy or fasciculations  Motor: There is pain involved but he is 5/5 bilateral LE proximally, 5/5 knee flexors-extensors. 0/5 left extensor  hallucis longus, foot invertors-evertors, as well as 0/5 left foot dorsiflexors and plantar extensors. 5/5 upper ext bilaterally  Tone and bulk:normal tone throughout; no atrophy noted Sensory: Pinprick and light touch intact diminished dorsum and inner aspect left foot as well as left big toe. Deep Tendon Reflexes:  Right: Upper Extremity   Left: Upper extremity   biceps (C-5 to C-6) 2/4   biceps (C-5 to C-6) 2/4 tricep (C7) 2/4    triceps (C7) 2/4 Brachioradialis (C6) 2/4  Brachioradialis (C6) 2/4  Lower Extremity Lower Extremity  quadriceps (L-2 to L-4) 2/4   quadriceps (L-2 to L-4) 2/4 Achilles (S1) 2/4   Achilles (S1) unable to elicit. Plantars: Right: downgoing   Left: downgoing Cerebellar: normal finger-to-nose,  normal heel-to-shin test Gait:  Can not walk without assistance.    No results found for this basename: cbc, bmp, coags, chol, tri, ldl, hga1c    Results for orders placed during the hospital encounter of 02/14/14 (from the past 65 hour(s))  I-STAT TROPOININ, ED     Status: None   Collection Time    02/14/14  7:29 PM      Result Value Ref Range   Troponin i, poc 0.01  0.00 - 0.08 ng/mL   Comment 3            Comment: Due to the release kinetics of cTnI,     a negative result within the first hours     of the onset of symptoms does not rule out     myocardial infarction with certainty.     If myocardial infarction is still suspected,     repeat the test at appropriate intervals.  I-STAT CHEM 8, ED     Status: Abnormal   Collection Time    02/14/14  7:30 PM      Result Value Ref Range   Sodium 131 (*) 137 - 147 mEq/L   Potassium 3.4 (*) 3.7 - 5.3 mEq/L   Chloride 97  96 - 112 mEq/L   BUN 52 (*) 6 - 23 mg/dL   Creatinine, Ser 3.80 (*) 0.50 - 1.35 mg/dL   Glucose, Bld 86  70 - 99 mg/dL   Calcium, Ion 0.96 (*) 1.12 - 1.23 mmol/L   TCO2 22  0 - 100 mmol/L   Hemoglobin 16.0  13.0 - 17.0 g/dL   HCT 47.0  39.0 - 52.0 %  CBC WITH DIFFERENTIAL     Status:  Abnormal   Collection Time    02/14/14  7:37 PM      Result Value Ref Range   WBC 15.6 (*) 4.0 - 10.5 K/uL   RBC 4.49  4.22 - 5.81 MIL/uL   Hemoglobin 15.0  13.0 - 17.0 g/dL   HCT 41.6  39.0 - 52.0 %   MCV 92.7  78.0 - 100.0 fL   MCH 33.4  26.0 - 34.0 pg   MCHC 36.1 (*)  30.0 - 36.0 g/dL   RDW 12.5  11.5 - 15.5 %   Platelets 182  150 - 400 K/uL   Neutrophils Relative % 78 (*) 43 - 77 %   Neutro Abs 12.2 (*) 1.7 - 7.7 K/uL   Lymphocytes Relative 17  12 - 46 %   Lymphs Abs 2.6  0.7 - 4.0 K/uL   Monocytes Relative 5  3 - 12 %   Monocytes Absolute 0.8  0.1 - 1.0 K/uL   Eosinophils Relative 0  0 - 5 %   Eosinophils Absolute 0.0  0.0 - 0.7 K/uL   Basophils Relative 0  0 - 1 %   Basophils Absolute 0.0  0.0 - 0.1 K/uL  URINALYSIS, ROUTINE W REFLEX MICROSCOPIC     Status: Abnormal   Collection Time    02/14/14  9:45 PM      Result Value Ref Range   Color, Urine YELLOW  YELLOW   APPearance CLOUDY (*) CLEAR   Specific Gravity, Urine 1.015  1.005 - 1.030   pH 5.0  5.0 - 8.0   Glucose, UA NEGATIVE  NEGATIVE mg/dL   Hgb urine dipstick LARGE (*) NEGATIVE   Bilirubin Urine NEGATIVE  NEGATIVE   Ketones, ur NEGATIVE  NEGATIVE mg/dL   Protein, ur NEGATIVE  NEGATIVE mg/dL   Urobilinogen, UA 0.2  0.0 - 1.0 mg/dL   Nitrite NEGATIVE  NEGATIVE   Leukocytes, UA NEGATIVE  NEGATIVE  URINE MICROSCOPIC-ADD ON     Status: Abnormal   Collection Time    02/14/14  9:45 PM      Result Value Ref Range   Squamous Epithelial / LPF RARE  RARE   WBC, UA 3-6  <3 WBC/hpf   RBC / HPF 7-10  <3 RBC/hpf   Bacteria, UA RARE  RARE   Casts HYALINE CASTS (*) NEGATIVE   Urine-Other MUCOUS PRESENT      Ct Head Wo Contrast  02/14/2014   CLINICAL DATA:  Status post fall  EXAM: CT HEAD WITHOUT CONTRAST  CT CERVICAL SPINE WITHOUT CONTRAST  TECHNIQUE: Multidetector CT imaging of the head and cervical spine was performed following the standard protocol without intravenous contrast. Multiplanar CT image reconstructions of  the cervical spine were also generated.  COMPARISON:  None.  In the MR C SPINE WO/W CM dated 04/26/2012; CT C SPINE W/O CM dated 10/11/2010; CT ANGIO HEAD W/CM &/OR WO/CM dated 12/02/2009  FINDINGS: CT HEAD FINDINGS  The ventricles are normal in size and position. There is no intracranial hemorrhage or intracranial mass effect. There is no evidence of an evolving ischemic infarction. The cerebellum and brainstem are normal in density. At bone window settings the observed portions of the paranasal sinuses and mastoid air cells are clear. There is no evidence of an acute skull fracture nor cephalohematoma.  CT CERVICAL SPINE FINDINGS  The patient has undergone previous anterior spinal fusion and C5-6 and C6-7 with intradiscal device placement. The vertebral bodies are preserved in height. The prevertebral soft tissue spaces appear normal. The metallic hardware appears intact. There is no evidence of a perched facet nor of a spinous process fracture. The odontoid is intact and the lateral masses of C1 align normally with those of C2. The bony ring at each cervical level is intact. The observed portions of the first and second ribs appear normal. The pulmonary apices are clear. The soft tissues of the neck appear normal where visualized.  IMPRESSION: 1. Normal noncontrast CT scan of the brain. 2.  There are postfusion changes at C5-6 and C6-7. There is mild degenerative disc bulging at C3-4. There is no evidence of an acute cervical spine fracture nor dislocation.   Electronically Signed   By: David  Martinique   On: 02/14/2014 19:12   Ct Cervical Spine Wo Contrast  02/14/2014   CLINICAL DATA:  Status post fall  EXAM: CT HEAD WITHOUT CONTRAST  CT CERVICAL SPINE WITHOUT CONTRAST  TECHNIQUE: Multidetector CT imaging of the head and cervical spine was performed following the standard protocol without intravenous contrast. Multiplanar CT image reconstructions of the cervical spine were also generated.  COMPARISON:  None.  In the  MR C SPINE WO/W CM dated 04/26/2012; CT C SPINE W/O CM dated 10/11/2010; CT ANGIO HEAD W/CM &/OR WO/CM dated 12/02/2009  FINDINGS: CT HEAD FINDINGS  The ventricles are normal in size and position. There is no intracranial hemorrhage or intracranial mass effect. There is no evidence of an evolving ischemic infarction. The cerebellum and brainstem are normal in density. At bone window settings the observed portions of the paranasal sinuses and mastoid air cells are clear. There is no evidence of an acute skull fracture nor cephalohematoma.  CT CERVICAL SPINE FINDINGS  The patient has undergone previous anterior spinal fusion and C5-6 and C6-7 with intradiscal device placement. The vertebral bodies are preserved in height. The prevertebral soft tissue spaces appear normal. The metallic hardware appears intact. There is no evidence of a perched facet nor of a spinous process fracture. The odontoid is intact and the lateral masses of C1 align normally with those of C2. The bony ring at each cervical level is intact. The observed portions of the first and second ribs appear normal. The pulmonary apices are clear. The soft tissues of the neck appear normal where visualized.  IMPRESSION: 1. Normal noncontrast CT scan of the brain. 2. There are postfusion changes at C5-6 and C6-7. There is mild degenerative disc bulging at C3-4. There is no evidence of an acute cervical spine fracture nor dislocation.   Electronically Signed   By: David  Martinique   On: 02/14/2014 19:12   Mr Lumbar Spine Wo Contrast  02/14/2014   CLINICAL DATA:  Low back pain.  Left-sided weakness and numbness.  EXAM: MRI LUMBAR SPINE WITHOUT CONTRAST  TECHNIQUE: Multiplanar, multisequence MR imaging was performed. No intravenous contrast was administered.  COMPARISON:  04/26/2012 MR.  FINDINGS: Last fully open disk space is labeled L5-S1. Present examination incorporates from T11-12 disc space through the S3 level.  Conus L1 level.  Visualized paravertebral  structures unremarkable.  T11-12: Negative.  T12-L1: Shallow left posterior lateral protrusion. Slight indentation left lateral aspect of the thecal sac. Minimal crowding of adjacent nerve roots.  L1-2:  Negative.  L2-3:  Negative.  L3-4:  Negative.  L4-5: Mild disc degeneration. Bulge which centrally touches but does not cause significant compression of the thecal sac. Left foraminal/lateral disc osteophyte with slight encroachment upon but not significant compression of the exiting left L4 nerve root.  L5-S1: Disc degeneration with disc space narrowing and broad-based disc osteophyte complex. Centrally no compression of the S1 nerve roots or thecal sac. Laterally, extension greater on the right with mild encroachment upon the exiting right L5 nerve root.  IMPRESSION: No significant change from the prior examination.  Summary of pertinent findings includes:  T12-L1 shallow left posterior lateral protrusion. Slight indentation left lateral aspect of the thecal sac. Minimal crowding of adjacent nerve roots.  L4-5 bulge which centrally touches but does not  cause significant compression of the thecal sac. Left foraminal/lateral disc osteophyte with slight encroachment upon but not significant compression of the exiting left L4 nerve root.  L5-S1 broad-based disc osteophyte complex. Centrally no compression of the S1 nerve roots or thecal sac. Laterally, extension greater on the right with mild encroachment upon the exiting right L5 nerve root.   Electronically Signed   By: Chauncey Cruel M.D.   On: 02/14/2014 21:37   Dg Chest Port 1 View  02/14/2014   CLINICAL DATA:  Weakness.  EXAM: PORTABLE CHEST - 1 VIEW  COMPARISON:  DG CHEST 1V PORT dated 06/24/2013  FINDINGS: Cardiomediastinal silhouette is unremarkable for this low inspiratory portable examination with crowded vasculature markings. Small linear density in right lung base. The lungs are otherwise clear without pleural effusions or focal consolidations. Trachea  projects midline and there is no pneumothorax. Included soft tissue planes and osseous structures are non-suspicious. ACDF. Multiple EKG lines overlie the patient and may obscure subtle underlying pathology. Interval extubation and removal of nasogastric tube.  IMPRESSION: Minimal right lung base atelectasis versus scarring.   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 MRI L-spine showed no evidence of frank disc herniation significantly compressing L4-L5 roots. No pelvic/hip fracture noted. Can not entirely exclude the possibility of traumatic injury/streching of the common peroneal nerve throughout his long course in the leg. EMG/NCS as outpatient after 2-3 weeks. Consider MRI left knee if suspicion of focal lesion/entrapment of the nerve at the fibular head. In the meantime, PT, symptomatic pain control. He is already on gabapentin.  Dorian Pod, MD 02/14/2014, 10:30 PM [facility].

## 2014-02-14 NOTE — ED Provider Notes (Signed)
CSN: [id number]     Arrival date & time 02/14/14  1551 History   First MD Initiated Contact with Patient 02/14/14 1726     Chief Complaint  Patient presents with  . Fall     (Consider location/radiation/quality/duration/timing/severity/associated sxs/prior Treatment) HPI Comments: 53 year old male with history of high blood pressure, depression, COPD, smoking, lipids, sciatica, lumbar disc disease presents with left foot drop, sciatica symptoms down left posterior thigh and neck pain since fall 3 days prior.  Patient recalls having left sciatica pain and felt his legs both gave way causing him to fall back and hit his neck and head. He had brief LOC. No known seizure disorder. Patient has abrasion on the frontal bone. Patient has had similar symptoms in the past however this is more severe. The foot drop is new. Patient has had surgery on his neck however not on his lumbar spine. He uses a walker to get around baseline.  Patient is a 53 y.o. male presenting with fall. The history is provided by the patient.  Fall Pertinent negatives include no chest pain, no abdominal pain, no headaches and no shortness of breath.    Past Medical History  Diagnosis Date  . Constipation due to pain medication   . COPD (chronic obstructive pulmonary disease)   . Status post spinal surgery   . Hypertension   . Anxiety    Past Surgical History  Procedure Laterality Date  . Spinal fixation surgery     Family History  Problem Relation Age of Onset  . Cancer Neg Hx    History  Substance Use Topics  . Smoking status: Current Every [place] Smoker -- 1.00 packs/[place]    Types: Cigarettes  . Smokeless tobacco: Not on file     Comment: decreased to 1ppd  . Alcohol Use: Not on file    Review of Systems  Constitutional: Negative for fever and chills.  HENT: Negative for congestion.   Eyes: Negative for visual disturbance.  Respiratory: Negative for shortness of breath.   Cardiovascular: Negative for chest  pain.  Gastrointestinal: Negative for vomiting and abdominal pain.  Genitourinary: Negative for dysuria and flank pain.  Musculoskeletal: Positive for back pain and neck pain. Negative for neck stiffness.  Skin: Negative for rash.  Neurological: Positive for weakness, light-headedness and numbness. Negative for seizures, speech difficulty and headaches.      Allergies  Fentanyl and Methadone  Home Medications   Current Outpatient Rx  Name  Route  Sig  Dispense  Refill  . albuterol (PROVENTIL HFA) 108 (90 BASE) MCG/ACT inhaler      INHALE TWO PUFFS BY MOUTH INTO LUNGS EVERY 4 HOURS AS NEEDED FOR WHEEZING   7 each   0   . butalbital-acetaminophen-caffeine (FIORICET, ESGIC) 50-325-40 MG per tablet   Oral   Take 1 tablet by mouth 2 (two) times daily as needed for headache.   60 tablet   3     Needs to see PCP prior to additional refills.   . gabapentin (NEURONTIN) 300 MG capsule   Oral   Take 3 capsules (900 mg total) by mouth 3 (three) times daily.   270 capsule   3   . hydrochlorothiazide (HYDRODIURIL) 25 MG tablet   Oral   Take 1 tablet (25 mg total) by mouth daily.   30 tablet   3   . lisinopril (PRINIVIL,ZESTRIL) 20 MG tablet   Oral   Take 1 tablet (20 mg total) by mouth daily.   90 tablet  3   . simvastatin (ZOCOR) 20 MG tablet   Oral   Take 1 tablet (20 mg total) by mouth every evening.   30 tablet   6   . tamsulosin (FLOMAX) 0.4 MG CAPS capsule   Oral   Take 2 capsules (0.8 mg total) by mouth daily.   60 capsule   6   . tiotropium (SPIRIVA) 18 MCG inhalation capsule   Inhalation   Place 1 capsule (18 mcg total) into inhaler and inhale daily.   30 capsule   12   . traZODone (DESYREL) 100 MG tablet   Oral   Take 1 tablet (100 mg total) by mouth at bedtime.   90 tablet   3    BP 135/119  Pulse 94  Temp(Src) 98.1 F (36.7 C) (Oral)  Resp 16  SpO2 98% Physical Exam  Nursing note and vitals reviewed. Constitutional: He is oriented to  person, place, and time. He appears well-developed and well-nourished.  HENT:  Head: Normocephalic and atraumatic.  Dry mm  Eyes: Conjunctivae are normal. Right eye exhibits no discharge. Left eye exhibits no discharge.  Neck: Normal range of motion. Neck supple. No tracheal deviation present.  Cardiovascular: Normal rate and regular rhythm.   Pulmonary/Chest: Effort normal and breath sounds normal.  Abdominal: Soft. He exhibits no distension. There is no tenderness. There is no guarding.  Musculoskeletal: He exhibits tenderness. He exhibits no edema.  Mild tenderness paraspinal cervical and midline proximal cervical. No midline vertebral tenderness in lumbar or thoracic region. No hip pain with flexion bilateral.  Neurological: He is alert and oriented to person, place, and time. GCS eye subscore is 4. GCS verbal subscore is 5. GCS motor subscore is 6.  Reflex Scores:      Patellar reflexes are 2+ on the right side and 2+ on the left side.      Achilles reflexes are 1+ on the right side and 1+ on the left side. Patient has 5+ strength with flexion of hips bilateral. Patient has 5+ strength with flexion of knees bilateral. Patient has 4+ strength with extension of left knee.  Patient has foot drop on the left and normal dorsiflexion on the right. Sensation intact bilateral lower extremities to sharp however patient feels less sharp on the left.  Skin: Skin is warm. No rash noted.  Psychiatric: He has a normal mood and affect.    ED Course  Procedures (including critical care time) Labs Review Labs Reviewed  CBC WITH DIFFERENTIAL - Abnormal; Notable for the following:    WBC 15.6 (*)    MCHC 36.1 (*)    Neutrophils Relative % 78 (*)    Neutro Abs 12.2 (*)    All other components within normal limits  URINALYSIS, ROUTINE W REFLEX MICROSCOPIC - Abnormal; Notable for the following:    APPearance CLOUDY (*)    Hgb urine dipstick LARGE (*)    All other components within normal limits   URINE MICROSCOPIC-ADD ON - Abnormal; Notable for the following:    Casts HYALINE CASTS (*)    All other components within normal limits  I-STAT CHEM 8, ED - Abnormal; Notable for the following:    Sodium 131 (*)    Potassium 3.4 (*)    BUN 52 (*)    Creatinine, Ser 3.80 (*)    Calcium, Ion 0.96 (*)    All other components within normal limits  URINE CULTURE  SODIUM, URINE, RANDOM  CREATININE, URINE, RANDOM  BASIC METABOLIC PANEL  CBC  TSH  I-STAT TROPOININ, ED   on care and and kidneys was performed and is no and IV morphine Lahey it is a ILI the number of low 105 revealed a presented here Imaging Review Ct Head Wo Contrast  02/14/2014   CLINICAL DATA:  Status post fall  EXAM: CT HEAD WITHOUT CONTRAST  CT CERVICAL SPINE WITHOUT CONTRAST  TECHNIQUE: Multidetector CT imaging of the head and cervical spine was performed following the standard protocol without intravenous contrast. Multiplanar CT image reconstructions of the cervical spine were also generated.  COMPARISON:  None.  In the MR C SPINE WO/W CM dated 04/26/2012; CT C SPINE W/O CM dated 10/11/2010; CT ANGIO HEAD W/CM &/OR WO/CM dated 12/02/2009  FINDINGS: CT HEAD FINDINGS  The ventricles are normal in size and position. There is no intracranial hemorrhage or intracranial mass effect. There is no evidence of an evolving ischemic infarction. The cerebellum and brainstem are normal in density. At bone window settings the observed portions of the paranasal sinuses and mastoid air cells are clear. There is no evidence of an acute skull fracture nor cephalohematoma.  CT CERVICAL SPINE FINDINGS  The patient has undergone previous anterior spinal fusion and C5-6 and C6-7 with intradiscal device placement. The vertebral bodies are preserved in height. The prevertebral soft tissue spaces appear normal. The metallic hardware appears intact. There is no evidence of a perched facet nor of a spinous process fracture. The odontoid is intact and the  lateral masses of C1 align normally with those of C2. The bony ring at each cervical level is intact. The observed portions of the first and second ribs appear normal. The pulmonary apices are clear. The soft tissues of the neck appear normal where visualized.  IMPRESSION: 1. Normal noncontrast CT scan of the brain. 2. There are postfusion changes at C5-6 and C6-7. There is mild degenerative disc bulging at C3-4. There is no evidence of an acute cervical spine fracture nor dislocation.   Electronically Signed   By: David  Martinique   On: 02/14/2014 19:12   Ct Cervical Spine Wo Contrast  02/14/2014   CLINICAL DATA:  Status post fall  EXAM: CT HEAD WITHOUT CONTRAST  CT CERVICAL SPINE WITHOUT CONTRAST  TECHNIQUE: Multidetector CT imaging of the head and cervical spine was performed following the standard protocol without intravenous contrast. Multiplanar CT image reconstructions of the cervical spine were also generated.  COMPARISON:  None.  In the MR C SPINE WO/W CM dated 04/26/2012; CT C SPINE W/O CM dated 10/11/2010; CT ANGIO HEAD W/CM &/OR WO/CM dated 12/02/2009  FINDINGS: CT HEAD FINDINGS  The ventricles are normal in size and position. There is no intracranial hemorrhage or intracranial mass effect. There is no evidence of an evolving ischemic infarction. The cerebellum and brainstem are normal in density. At bone window settings the observed portions of the paranasal sinuses and mastoid air cells are clear. There is no evidence of an acute skull fracture nor cephalohematoma.  CT CERVICAL SPINE FINDINGS  The patient has undergone previous anterior spinal fusion and C5-6 and C6-7 with intradiscal device placement. The vertebral bodies are preserved in height. The prevertebral soft tissue spaces appear normal. The metallic hardware appears intact. There is no evidence of a perched facet nor of a spinous process fracture. The odontoid is intact and the lateral masses of C1 align normally with those of C2. The bony ring  at each cervical level is intact. The observed portions of the first and second ribs  appear normal. The pulmonary apices are clear. The soft tissues of the neck appear normal where visualized.  IMPRESSION: 1. Normal noncontrast CT scan of the brain. 2. There are postfusion changes at C5-6 and C6-7. There is mild degenerative disc bulging at C3-4. There is no evidence of an acute cervical spine fracture nor dislocation.   Electronically Signed   By: David  Martinique   On: 02/14/2014 19:12   Mr Lumbar Spine Wo Contrast  02/14/2014   CLINICAL DATA:  Low back pain.  Left-sided weakness and numbness.  EXAM: MRI LUMBAR SPINE WITHOUT CONTRAST  TECHNIQUE: Multiplanar, multisequence MR imaging was performed. No intravenous contrast was administered.  COMPARISON:  04/26/2012 MR.  FINDINGS: Last fully open disk space is labeled L5-S1. Present examination incorporates from T11-12 disc space through the S3 level.  Conus L1 level.  Visualized paravertebral structures unremarkable.  T11-12: Negative.  T12-L1: Shallow left posterior lateral protrusion. Slight indentation left lateral aspect of the thecal sac. Minimal crowding of adjacent nerve roots.  L1-2:  Negative.  L2-3:  Negative.  L3-4:  Negative.  L4-5: Mild disc degeneration. Bulge which centrally touches but does not cause significant compression of the thecal sac. Left foraminal/lateral disc osteophyte with slight encroachment upon but not significant compression of the exiting left L4 nerve root.  L5-S1: Disc degeneration with disc space narrowing and broad-based disc osteophyte complex. Centrally no compression of the S1 nerve roots or thecal sac. Laterally, extension greater on the right with mild encroachment upon the exiting right L5 nerve root.  IMPRESSION: No significant change from the prior examination.  Summary of pertinent findings includes:  T12-L1 shallow left posterior lateral protrusion. Slight indentation left lateral aspect of the thecal sac. Minimal  crowding of adjacent nerve roots.  L4-5 bulge which centrally touches but does not cause significant compression of the thecal sac. Left foraminal/lateral disc osteophyte with slight encroachment upon but not significant compression of the exiting left L4 nerve root.  L5-S1 broad-based disc osteophyte complex. Centrally no compression of the S1 nerve roots or thecal sac. Laterally, extension greater on the right with mild encroachment upon the exiting right L5 nerve root.   Electronically Signed   By: Chauncey Cruel M.D.   On: 02/14/2014 21:37   Dg Chest Port 1 View  02/14/2014   CLINICAL DATA:  Weakness.  EXAM: PORTABLE CHEST - 1 VIEW  COMPARISON:  DG CHEST 1V PORT dated 06/24/2013  FINDINGS: Cardiomediastinal silhouette is unremarkable for this low inspiratory portable examination with crowded vasculature markings. Small linear density in right lung base. The lungs are otherwise clear without pleural effusions or focal consolidations. Trachea projects midline and there is no pneumothorax. Included soft tissue planes and osseous structures are non-suspicious. ACDF. Multiple EKG lines overlie the patient and may obscure subtle underlying pathology. Interval extubation and removal of nasogastric tube.  IMPRESSION: Minimal right lung base atelectasis versus scarring.   Electronically Signed   By: Elon Alas   On: 02/14/2014 20:04     EKG Interpretation None      Date: 02/14/2014  Rate: 99  Rhythm: normal sinus rhythm  QRS Axis: left  Intervals: normal  ST/T Wave abnormalities: nonspecific ST changes  Conduction Disutrbances:none  Narrative Interpretation:    MDM   Final diagnoses:  Left foot drop  Acute renal failure  COPD (chronic obstructive pulmonary disease)  Fall  Lumbar disc herniation  With known disc disease and worsening left low semi-symptoms with foot drop since the fall plan for MRI  lumbar spine. Basic blood work and EKG with possible syncope. Patient denies any bleeding,  chest pain, shortness of breath. Norco given for pain. CT head and neck to rule out fracture. Or bleeding.  Lab work showed new onset acute renal failure.  Bedside ultrasound done which showed urinary retention. Concern for prostate versus lumbar disc disease as cause. In and out catheter ordered. delay in MRI.  Spoke with [facility] for admission. Neurology consulted and will followup MRI results.   Fluid bolus given in the ER. UA pending.  CT is no acute finding.  The patients results and plan were reviewed and discussed.   Any x-rays performed were personally reviewed by myself.   Differential diagnosis were considered with the presenting HPI.   Filed Vitals:   02/14/14 2208 02/14/14 2210 02/14/14 2212 02/14/14 2215  BP: 110/71 108/73 107/73 118/75  Pulse: 95 100 100 112  Temp: 98.9 F (37.2 C)     TempSrc: Oral     Resp: 18     Height: 5\' 11"  (1.803 m)     Weight: 249 lb 1.6 oz (112.991 kg)     SpO2: 99% 94% 96% 97%    Admission/ observation were discussed with the admitting physician, patient and/or family and they are comfortable with the plan.        Mariea Clonts, MD 02/15/14 [phone]

## 2014-02-14 NOTE — ED Notes (Signed)
PT was able to urinate on his own and urine was sent

## 2014-02-15 LAB — BASIC METABOLIC PANEL
BUN: 61 mg/dL — AB (ref 6–23)
BUN: 64 mg/dL — ABNORMAL HIGH (ref 6–23)
CALCIUM: 8 mg/dL — AB (ref 8.4–10.5)
CHLORIDE: 95 meq/L — AB (ref 96–112)
CO2: 22 meq/L (ref 19–32)
CO2: 23 mEq/L (ref 19–32)
Calcium: 7.9 mg/dL — ABNORMAL LOW (ref 8.4–10.5)
Chloride: 98 mEq/L (ref 96–112)
Creatinine, Ser: 3.07 mg/dL — ABNORMAL HIGH (ref 0.50–1.35)
Creatinine, Ser: 2.93 mg/dL — ABNORMAL HIGH (ref 0.50–1.35)
GFR calc Af Amer: 27 mL/min — ABNORMAL LOW (ref >90)
GFR calc non Af Amer: 22 mL/min — ABNORMAL LOW (ref >90)
GFR, EST AFRICAN AMERICAN: 25 mL/min — AB (ref >90)
GFR, EST NON AFRICAN AMERICAN: 23 mL/min — AB (ref >90)
GLUCOSE: 116 mg/dL — AB (ref 70–99)
GLUCOSE: 105 mg/dL — AB (ref 70–99)
Potassium: 3.1 mEq/L — ABNORMAL LOW (ref 3.7–5.3)
Potassium: 3.4 mEq/L — ABNORMAL LOW (ref 3.7–5.3)
Sodium: 135 mEq/L — ABNORMAL LOW (ref 137–147)
Sodium: 137 mEq/L (ref 137–147)

## 2014-02-15 LAB — CBC
HCT: 36.7 % — ABNORMAL LOW (ref 39.0–52.0)
HEMATOCRIT: 37.1 % — AB (ref 39.0–52.0)
HEMOGLOBIN: 13.2 g/dL (ref 13.0–17.0)
Hemoglobin: 13 g/dL (ref 13.0–17.0)
MCH: 33.1 pg (ref 26.0–34.0)
MCH: 33 pg (ref 26.0–34.0)
MCHC: 35.6 g/dL (ref 30.0–36.0)
MCHC: 35.4 g/dL (ref 30.0–36.0)
MCV: 93 fL (ref 78.0–100.0)
MCV: 93.1 fL (ref 78.0–100.0)
Platelets: 175 10*3/uL (ref 150–400)
Platelets: 164 10*3/uL (ref 150–400)
RBC: 3.99 MIL/uL — ABNORMAL LOW (ref 4.22–5.81)
RBC: 3.94 MIL/uL — AB (ref 4.22–5.81)
RDW: 12.6 % (ref 11.5–15.5)
RDW: 12.5 % (ref 11.5–15.5)
WBC: 12.6 10*3/uL — AB (ref 4.0–10.5)
WBC: 10.9 10*3/uL — AB (ref 4.0–10.5)

## 2014-02-15 LAB — TSH: TSH: 0.892 u[IU]/mL (ref 0.350–4.500)

## 2014-02-15 MED ORDER — KETOROLAC TROMETHAMINE 10 MG PO TABS
10.0000 mg | ORAL_TABLET | Freq: Two times a day (BID) | ORAL | Status: DC
  Administered 2014-02-15: 10 mg via ORAL
  Filled 2014-02-15 (×2): qty 1

## 2014-02-15 MED ORDER — HEPARIN SODIUM (PORCINE) 5000 UNIT/ML IJ SOLN
5000.0000 [IU] | Freq: Three times a day (TID) | INTRAMUSCULAR | Status: DC
  Administered 2014-02-15 – 2014-02-16 (×2): 5000 [IU] via SUBCUTANEOUS
  Filled 2014-02-15 (×5): qty 1

## 2014-02-15 NOTE — Evaluation (Signed)
Physical Therapy Evaluation Patient Details Name: [name] [name] MRN: [id number] DOB: 12/17/1960 Today's Date: 02/15/2014   History of Present Illness  Adm 4/10 s/p fall (pt reports knees buckled, no syncope) with LOC after hitting his head/neck. Pt with Lt foot drop with numbness distal leg and foot. MRI L-spine showed T12-L1 shallow left posterior lateral protrusion. Slight indentation left lateral aspect of the thecal sac. Minimal crowding of adjacent nerve roots. L4-5 bulge which centrally touches but does not cause significant compression of the thecal sac. Left foraminal/lateral disc osteophyte with slight encroachment upon but not significant compression of the exiting left L4 nerve root. L5-S1 broad-based disc osteophyte complex. Centrally no compression of the S1 nerve roots or thecal sac. Laterally, extension greater on the right with mild encroachment upon the exiting right L5 nerve root. Neurology consult feels no indication for surgery and possible peroneal nerve injury.  Clinical Impression  Pt admitted with Lt foot drop s/p fall with LOC. Pt will benefit from an AFO to incr safety with gait and stairs to enter his home. Pt currently with functional limitations due to the deficits listed below (see PT Problem List).  Pt will benefit from skilled PT to increase their independence and safety with mobility to allow discharge to the venue listed below.       Follow Up Recommendations Home health PT (afety eval due to reported areas too narrow for him to use his walker--fall occurred when not using walker) ;Supervision for mobility/OOB    Equipment Recommendations  Rolling walker with 5" wheels; (Lt AFO, shoe size 9.5)    Recommendations for Other Services       Precautions / Restrictions Precautions Precautions: Fall Precaution Comments: denies falls in past 6 months; usually uses walker; didn't have when fell      Mobility  Bed Mobility Overal bed mobility: Modified Independent              General bed mobility comments: incr time due to Lt buttock pain   Transfers Overall transfer level: Needs assistance Equipment used: Rolling walker (2 wheeled) Transfers: Sit to/from Stand Sit to Stand: From elevated surface;Min assist         General transfer comment: bed elevated to simulate home environment; min assist to steady RW and steady assist to pt  Ambulation/Gait Ambulation/Gait assistance: Supervision Ambulation Distance (Feet): 45 Feet Assistive device: Rolling walker (2 wheeled) Gait Pattern/deviations: Step-to pattern;Decreased stride length;Decreased dorsiflexion - left;Steppage   Gait velocity interpretation: Below normal speed for age/gender    Stairs Stairs:  (TBA with AFO (if able to obtain prior to d/c))          Wheelchair Mobility    Modified Rankin (Stroke Patients Only)       Balance Overall balance assessment: History of Falls                                           Pertinent Vitals/Pain 8/10 Lt buttock (with sitting); "all over..I feel like I've been run over"    Home Living Family/patient expects to be discharged to:: Private residence Living Arrangements: Spouse/significant other   Type of Home: Mobile home Home Access: Stairs to enter Entrance Stairs-Rails: Right;Left Entrance Stairs-Number of Steps: 5 Home Layout: One level Home Equipment: Walker - standard;Shower seat (has the wheels to put on his std walker) Additional Comments: several places the walker will not fit  Prior Function Level of Independence: Independent with assistive device(s)         Comments: driving and going grocery shopping with shopping cart     Hand Dominance        Extremity/Trunk Assessment   Upper Extremity Assessment: Overall WFL for tasks assessed           Lower Extremity Assessment: LLE deficits/detail   LLE Deficits / Details: hip, knee WFL; ankle 0/5 DF, inversion,  eversion  Cervical / Trunk Assessment: Normal  Communication   Communication: No difficulties  Cognition Arousal/Alertness: Awake/alert Behavior During Therapy: WFL for tasks assessed/performed Overall Cognitive Status: Within Functional Limits for tasks assessed                      General Comments General comments (skin integrity, edema, etc.): pt reports he prefers to use std walker due to walks on grass/dirt to from car and wheels do not do well on this terrain. Educated pt on benefits of RW related to back pain and safety (stays in contact with floor). Pt unsure how long he has had his current walker, and is interested in a new walker to have one for inside with wheels and one for outside (no wheels)    Exercises        Assessment/Plan    PT Assessment Patient needs continued PT services  PT Diagnosis Difficulty walking;Acute pain   PT Problem List Decreased strength;Decreased activity tolerance;Decreased balance;Decreased mobility;Decreased knowledge of use of DME;Impaired sensation;Pain  PT Treatment Interventions DME instruction;Gait training;Stair training;Functional mobility training;Therapeutic activities;Balance training;Patient/family education   PT Goals (Current goals can be found in the Care Plan section) Acute Rehab PT Goals Patient Stated Goal: go home today PT Goal Formulation: With patient Time For Goal Achievement: 02/16/14 Potential to Achieve Goals: Good    Frequency Min 5X/week   Barriers to discharge        Co-evaluation               End of Session Equipment Utilized During Treatment: Gait belt Activity Tolerance: Patient limited by pain Patient left: in chair;with call bell/phone within reach Nurse Communication: Mobility status;Other (comment) (needs AFO & stair training)         Time: 7169-6789 PT Time Calculation (min): 30 min   Charges:   PT Evaluation $Initial PT Evaluation Tier I: 1 Procedure PT Treatments $Gait  Training: 23-37 mins   PT G CodesJeanie Cooks Emberli Ballester 20-Feb-2014, 11:41 AM Pager [phone]

## 2014-02-15 NOTE — Progress Notes (Signed)
Orthopedic Tech Progress Note Patient Details:  [name] [name] 10/09/1961 [id number] Brace order called in to [facility]. Fritz Pickerel to take care of order.  Patient ID: [name] [name], male   DOB: Jun 20, 1961, 53 y.o.   MRN: [id number]   [name] 02/15/2014, 12:15 PM

## 2014-02-15 NOTE — Progress Notes (Signed)
Subjective: Patient continues to complain of pain and rates his pain this morning at a 10/10.  Reports that at home he is usually between a 5/10 and a 8/10.  Foot drop continues.  Patient remains frustrated about the fact that no physician wants to do surgery on his back.    Objective: Current vital signs: BP 132/79  Pulse 93  Temp(Src) 98.2 F (36.8 C) (Oral)  Resp 18  Ht 5\' 11"  (1.803 m)  Wt 112.991 kg (249 lb 1.6 oz)  BMI 34.76 kg/m2  SpO2 99% Vital signs in last 24 hours: Temp:  [98.1 F (36.7 C)-98.9 F (37.2 C)] 98.2 F (36.8 C) (04/11 0558) Pulse Rate:  [93-112] 93 (04/11 1018) Resp:  [16-26] 18 (04/11 0558) BP: (107-135)/(61-119) 132/79 mmHg (04/11 1018) SpO2:  [94 %-99 %] 99 % (04/11 0558) Weight:  [112.991 kg (249 lb 1.6 oz)] 112.991 kg (249 lb 1.6 oz) (04/10 2208)  Intake/Output from previous [place]: 04/10 0701 - 04/11 0700 In: -  Out: 400 [Urine:400] Intake/Output this shift: Total I/O In: -  Out: 450 [Urine:450] Nutritional status: General  Neurologic Exam: Mental Status:  Alert, oriented, thought content appropriate. Speech fluent without evidence of aphasia. Able to follow 3 step commands without difficulty.  Cranial Nerves:  II: Discs flat bilaterally; Visual fields grossly normal, pupils equal, round, reactive to light and accommodation  III,IV, VI: ptosis not present, extra-ocular motions intact bilaterally  V,VII: smile symmetric, facial light touch sensation normal bilaterally  VIII: hearing normal bilaterally  IX,X: gag reflex present  XI: bilateral shoulder shrug  XII: midline tongue extension without atrophy or fasciculations  Motor:  There is pain involved but he is 5/5 bilateral LE proximally, 5/5 knee flexors-extensors bilaterally and the right ankle flexors and extensors. 0/5 left extensor hallucis longus, foot invertors-evertors, as well as 0/5 left foot dorsiflexors and plantar extensors.  5/5 upper ext bilaterally  Tone and bulk:normal tone  throughout; no atrophy noted  Sensory: Pinprick and light touch intact diminished dorsum and inner aspect left foot as well as left big toe.  Deep Tendon Reflexes:  2+ throughout with an absent left AJ Plantars:  Right: downgoing   Left: downgoing   Lab Results: Basic Metabolic Panel:  Recent Labs Lab 02/14/14 1930  NA 131*  K 3.4*  CL 97  GLUCOSE 86  BUN 52*  CREATININE 3.80*    Liver Function Tests: No results found for this basename: AST, ALT, ALKPHOS, BILITOT, PROT, ALBUMIN,  in the last 168 hours No results found for this basename: LIPASE, AMYLASE,  in the last 168 hours No results found for this basename: AMMONIA,  in the last 168 hours  CBC:  Recent Labs Lab 02/14/14 1930 02/14/14 1937  WBC  --  15.6*  NEUTROABS  --  12.2*  HGB 16.0 15.0  HCT 47.0 41.6  MCV  --  92.7  PLT  --  182    Cardiac Enzymes: No results found for this basename: CKTOTAL, CKMB, CKMBINDEX, TROPONINI,  in the last 168 hours  Lipid Panel: No results found for this basename: CHOL, TRIG, HDL, CHOLHDL, VLDL, LDLCALC,  in the last 168 hours  CBG: No results found for this basename: GLUCAP,  in the last 168 hours  Microbiology: Results for orders placed in visit on 07/10/13  URINE CULTURE     Status: None   Collection Time    07/10/13  4:26 PM      Result Value Ref Range Status   Colony Count  NO GROWTH   Final   Organism ID, Bacteria NO GROWTH   Final    Coagulation Studies: No results found for this basename: LABPROT, INR,  in the last 72 hours  Imaging: Ct Head Wo Contrast  02/14/2014   CLINICAL DATA:  Status post fall  EXAM: CT HEAD WITHOUT CONTRAST  CT CERVICAL SPINE WITHOUT CONTRAST  TECHNIQUE: Multidetector CT imaging of the head and cervical spine was performed following the standard protocol without intravenous contrast. Multiplanar CT image reconstructions of the cervical spine were also generated.  COMPARISON:  None.  In the MR C SPINE WO/W CM dated 04/26/2012; CT C SPINE  W/O CM dated 10/11/2010; CT ANGIO HEAD W/CM &/OR WO/CM dated 12/02/2009  FINDINGS: CT HEAD FINDINGS  The ventricles are normal in size and position. There is no intracranial hemorrhage or intracranial mass effect. There is no evidence of an evolving ischemic infarction. The cerebellum and brainstem are normal in density. At bone window settings the observed portions of the paranasal sinuses and mastoid air cells are clear. There is no evidence of an acute skull fracture nor cephalohematoma.  CT CERVICAL SPINE FINDINGS  The patient has undergone previous anterior spinal fusion and C5-6 and C6-7 with intradiscal device placement. The vertebral bodies are preserved in height. The prevertebral soft tissue spaces appear normal. The metallic hardware appears intact. There is no evidence of a perched facet nor of a spinous process fracture. The odontoid is intact and the lateral masses of C1 align normally with those of C2. The bony ring at each cervical level is intact. The observed portions of the first and second ribs appear normal. The pulmonary apices are clear. The soft tissues of the neck appear normal where visualized.  IMPRESSION: 1. Normal noncontrast CT scan of the brain. 2. There are postfusion changes at C5-6 and C6-7. There is mild degenerative disc bulging at C3-4. There is no evidence of an acute cervical spine fracture nor dislocation.   Electronically Signed   By: David  Martinique   On: 02/14/2014 19:12   Ct Cervical Spine Wo Contrast  02/14/2014   CLINICAL DATA:  Status post fall  EXAM: CT HEAD WITHOUT CONTRAST  CT CERVICAL SPINE WITHOUT CONTRAST  TECHNIQUE: Multidetector CT imaging of the head and cervical spine was performed following the standard protocol without intravenous contrast. Multiplanar CT image reconstructions of the cervical spine were also generated.  COMPARISON:  None.  In the MR C SPINE WO/W CM dated 04/26/2012; CT C SPINE W/O CM dated 10/11/2010; CT ANGIO HEAD W/CM &/OR WO/CM dated  12/02/2009  FINDINGS: CT HEAD FINDINGS  The ventricles are normal in size and position. There is no intracranial hemorrhage or intracranial mass effect. There is no evidence of an evolving ischemic infarction. The cerebellum and brainstem are normal in density. At bone window settings the observed portions of the paranasal sinuses and mastoid air cells are clear. There is no evidence of an acute skull fracture nor cephalohematoma.  CT CERVICAL SPINE FINDINGS  The patient has undergone previous anterior spinal fusion and C5-6 and C6-7 with intradiscal device placement. The vertebral bodies are preserved in height. The prevertebral soft tissue spaces appear normal. The metallic hardware appears intact. There is no evidence of a perched facet nor of a spinous process fracture. The odontoid is intact and the lateral masses of C1 align normally with those of C2. The bony ring at each cervical level is intact. The observed portions of the first and second ribs appear normal.  The pulmonary apices are clear. The soft tissues of the neck appear normal where visualized.  IMPRESSION: 1. Normal noncontrast CT scan of the brain. 2. There are postfusion changes at C5-6 and C6-7. There is mild degenerative disc bulging at C3-4. There is no evidence of an acute cervical spine fracture nor dislocation.   Electronically Signed   By: David  Martinique   On: 02/14/2014 19:12   Mr Lumbar Spine Wo Contrast  02/14/2014   CLINICAL DATA:  Low back pain.  Left-sided weakness and numbness.  EXAM: MRI LUMBAR SPINE WITHOUT CONTRAST  TECHNIQUE: Multiplanar, multisequence MR imaging was performed. No intravenous contrast was administered.  COMPARISON:  04/26/2012 MR.  FINDINGS: Last fully open disk space is labeled L5-S1. Present examination incorporates from T11-12 disc space through the S3 level.  Conus L1 level.  Visualized paravertebral structures unremarkable.  T11-12: Negative.  T12-L1: Shallow left posterior lateral protrusion. Slight  indentation left lateral aspect of the thecal sac. Minimal crowding of adjacent nerve roots.  L1-2:  Negative.  L2-3:  Negative.  L3-4:  Negative.  L4-5: Mild disc degeneration. Bulge which centrally touches but does not cause significant compression of the thecal sac. Left foraminal/lateral disc osteophyte with slight encroachment upon but not significant compression of the exiting left L4 nerve root.  L5-S1: Disc degeneration with disc space narrowing and broad-based disc osteophyte complex. Centrally no compression of the S1 nerve roots or thecal sac. Laterally, extension greater on the right with mild encroachment upon the exiting right L5 nerve root.  IMPRESSION: No significant change from the prior examination.  Summary of pertinent findings includes:  T12-L1 shallow left posterior lateral protrusion. Slight indentation left lateral aspect of the thecal sac. Minimal crowding of adjacent nerve roots.  L4-5 bulge which centrally touches but does not cause significant compression of the thecal sac. Left foraminal/lateral disc osteophyte with slight encroachment upon but not significant compression of the exiting left L4 nerve root.  L5-S1 broad-based disc osteophyte complex. Centrally no compression of the S1 nerve roots or thecal sac. Laterally, extension greater on the right with mild encroachment upon the exiting right L5 nerve root.   Electronically Signed   By: Chauncey Cruel M.D.   On: 02/14/2014 21:37   Dg Chest Port 1 View  02/14/2014   CLINICAL DATA:  Weakness.  EXAM: PORTABLE CHEST - 1 VIEW  COMPARISON:  DG CHEST 1V PORT dated 06/24/2013  FINDINGS: Cardiomediastinal silhouette is unremarkable for this low inspiratory portable examination with crowded vasculature markings. Small linear density in right lung base. The lungs are otherwise clear without pleural effusions or focal consolidations. Trachea projects midline and there is no pneumothorax. Included soft tissue planes and osseous structures are  non-suspicious. ACDF. Multiple EKG lines overlie the patient and may obscure subtle underlying pathology. Interval extubation and removal of nasogastric tube.  IMPRESSION: Minimal right lung base atelectasis versus scarring.   Electronically Signed   By: Elon Alas   On: 02/14/2014 20:04    Medications:  I have reviewed the patient's current medications. Scheduled: . enoxaparin (LOVENOX) injection  40 mg Subcutaneous Q24H  . gabapentin  900 mg Oral TID  . simvastatin  20 mg Oral QPM  . sodium chloride  3 mL Intravenous Q12H  . tamsulosin  0.8 mg Oral Daily  . tiotropium  18 mcg Inhalation Daily  . traZODone  100 mg Oral QHS    Assessment/Plan: Imaging still shows no pathology that would warrant surgery.  Patient on Neurontin and receiving  prn Dilaudid for pain.  With fall would consider something with anti-inflammatory properties.    Recommendations: 1.  Toradol BID 2.  Therapy with possible AFO.      LOS: 1 [place]   Alexis Goodell, MD [facility] [phone] 02/15/2014  10:29 AM

## 2014-02-15 NOTE — Progress Notes (Signed)
PT Cancellation Note  Patient Details Name: [name] [name] MRN: [id number] DOB: 01-27-1961   Cancelled Treatment:    Reason Treat Not Completed: AFO has been delivered, however family has not arrived with shoes yet. Will see in am for gait/stair training with AFO (and shoes).   Jeanie Cooks Rashay Barnette 02/15/2014, 4:17 PM Pager [phone]

## 2014-02-15 NOTE — Progress Notes (Signed)
[facility] PROGRESS NOTE  Antonia Wilner CBJ:[id number] DOB: 07-26-1961 DOA: 02/14/2014 PCP: Linna Darner, MD  Assessment/Plan: Fall with left foot drop Unlikely of a syncopal event. Possibly head injury following a mechanical fall. Stable on telemetry. According antihypertensive medications. Continue IV fluids MRI of the lumbar spine shows chronic findings without new or acute compression of the cord. Patient likely has injury to the left common peroneal nerve causing foot drop. Left ankle-foot orthosis ordered. Seen by physical therapy and recommended rolling walker. Pain control with when necessary Vicodin and and Dilaudid.  Appreciate neurology recommendation. Recommended toradol for anti-inflammatory  however given his acute kidney injury I would avoid using it. -Patient really need nerve conduction study done as outpatient in few weeks  Hypertension Given acute kidney injury and soft blood pressure HCTZ and lisinopril held  on admission  Acute kidney injury Possibly prerenal .FeNa of 0.4.  Patient also on HCTZ and ACE inhibitor which had been held. Continue IV fluids. Recheck labs today.  Hypokalemia Replenished  Monitor   Leukocytosis Possibly reactive. No signs of infection. Follow labs today  COPD Stable. Continue inhalers  Hyperlipidemia Continue statin  Code Status: Full code Family Communication: None at bedside Disposition Plan: Home possibly tomorrow if remains stable   Consultants:  Neurology  Procedures:  MRI of lumbar spine  Antibiotics:  None  HPI/Subjective: Patient seen and examined. Complains of low back pain. Patient depressed as he found out his mother has stage IV cancer.  Objective: Filed Vitals:   02/15/14 1018  BP: 132/79  Pulse: 93  Temp:   Resp:     Intake/Output Summary (Last 24 hours) at 02/15/14 1512 Last data filed at 02/15/14 0800  Gross per 24 hour  Intake      0 ml  Output    850 ml  Net   -850 ml   Filed  Weights   02/14/14 2208  Weight: 112.991 kg (249 lb 1.6 oz)    Exam:   General:  Middle aged obese male in no acute distress  HEENT: No pallor, moist oral mucosa  Chest: Clear to auscultation bilaterally, no added sounds  CVS: Normal S1 and S2, no murmurs rub or gallop  Abdomen: Soft, nontender, nondistended, bowel sounds present  Extremities: Warm, no edema, impaired movement of left foot with limited dorsi and plantar flexion, normal sensations  CNS: AAO x3    Data Reviewed: Basic Metabolic Panel:  Recent Labs Lab 02/14/14 1930  NA 131*  K 3.4*  CL 97  GLUCOSE 86  BUN 52*  CREATININE 3.80*   Liver Function Tests: No results found for this basename: AST, ALT, ALKPHOS, BILITOT, PROT, ALBUMIN,  in the last 168 hours No results found for this basename: LIPASE, AMYLASE,  in the last 168 hours No results found for this basename: AMMONIA,  in the last 168 hours CBC:  Recent Labs Lab 02/14/14 1930 02/14/14 1937  WBC  --  15.6*  NEUTROABS  --  12.2*  HGB 16.0 15.0  HCT 47.0 41.6  MCV  --  92.7  PLT  --  182   Cardiac Enzymes: No results found for this basename: CKTOTAL, CKMB, CKMBINDEX, TROPONINI,  in the last 168 hours BNP (last 3 results) No results found for this basename: PROBNP,  in the last 8760 hours CBG: No results found for this basename: GLUCAP,  in the last 168 hours  No results found for this or any previous visit (from the past 240 hour(s)).   Studies: Ct  Head Wo Contrast  02/14/2014   CLINICAL DATA:  Status post fall  EXAM: CT HEAD WITHOUT CONTRAST  CT CERVICAL SPINE WITHOUT CONTRAST  TECHNIQUE: Multidetector CT imaging of the head and cervical spine was performed following the standard protocol without intravenous contrast. Multiplanar CT image reconstructions of the cervical spine were also generated.  COMPARISON:  None.  In the MR C SPINE WO/W CM dated 04/26/2012; CT C SPINE W/O CM dated 10/11/2010; CT ANGIO HEAD W/CM &/OR WO/CM dated 12/02/2009   FINDINGS: CT HEAD FINDINGS  The ventricles are normal in size and position. There is no intracranial hemorrhage or intracranial mass effect. There is no evidence of an evolving ischemic infarction. The cerebellum and brainstem are normal in density. At bone window settings the observed portions of the paranasal sinuses and mastoid air cells are clear. There is no evidence of an acute skull fracture nor cephalohematoma.  CT CERVICAL SPINE FINDINGS  The patient has undergone previous anterior spinal fusion and C5-6 and C6-7 with intradiscal device placement. The vertebral bodies are preserved in height. The prevertebral soft tissue spaces appear normal. The metallic hardware appears intact. There is no evidence of a perched facet nor of a spinous process fracture. The odontoid is intact and the lateral masses of C1 align normally with those of C2. The bony ring at each cervical level is intact. The observed portions of the first and second ribs appear normal. The pulmonary apices are clear. The soft tissues of the neck appear normal where visualized.  IMPRESSION: 1. Normal noncontrast CT scan of the brain. 2. There are postfusion changes at C5-6 and C6-7. There is mild degenerative disc bulging at C3-4. There is no evidence of an acute cervical spine fracture nor dislocation.   Electronically Signed   By: David  [place]   On: 02/14/2014 19:12   Ct Cervical Spine Wo Contrast  02/14/2014   CLINICAL DATA:  Status post fall  EXAM: CT HEAD WITHOUT CONTRAST  CT CERVICAL SPINE WITHOUT CONTRAST  TECHNIQUE: Multidetector CT imaging of the head and cervical spine was performed following the standard protocol without intravenous contrast. Multiplanar CT image reconstructions of the cervical spine were also generated.  COMPARISON:  None.  In the MR C SPINE WO/W CM dated 04/26/2012; CT C SPINE W/O CM dated 10/11/2010; CT ANGIO HEAD W/CM &/OR WO/CM dated 12/02/2009  FINDINGS: CT HEAD FINDINGS  The ventricles are normal in size and  position. There is no intracranial hemorrhage or intracranial mass effect. There is no evidence of an evolving ischemic infarction. The cerebellum and brainstem are normal in density. At bone window settings the observed portions of the paranasal sinuses and mastoid air cells are clear. There is no evidence of an acute skull fracture nor cephalohematoma.  CT CERVICAL SPINE FINDINGS  The patient has undergone previous anterior spinal fusion and C5-6 and C6-7 with intradiscal device placement. The vertebral bodies are preserved in height. The prevertebral soft tissue spaces appear normal. The metallic hardware appears intact. There is no evidence of a perched facet nor of a spinous process fracture. The odontoid is intact and the lateral masses of C1 align normally with those of C2. The bony ring at each cervical level is intact. The observed portions of the first and second ribs appear normal. The pulmonary apices are clear. The soft tissues of the neck appear normal where visualized.  IMPRESSION: 1. Normal noncontrast CT scan of the brain. 2. There are postfusion changes at C5-6 and C6-7. There is mild  degenerative disc bulging at C3-4. There is no evidence of an acute cervical spine fracture nor dislocation.   Electronically Signed   By: David  [place]   On: 02/14/2014 19:12   Mr Lumbar Spine Wo Contrast  02/14/2014   CLINICAL DATA:  Low back pain.  Left-sided weakness and numbness.  EXAM: MRI LUMBAR SPINE WITHOUT CONTRAST  TECHNIQUE: Multiplanar, multisequence MR imaging was performed. No intravenous contrast was administered.  COMPARISON:  04/26/2012 MR.  FINDINGS: Last fully open disk space is labeled L5-S1. Present examination incorporates from T11-12 disc space through the S3 level.  Conus L1 level.  Visualized paravertebral structures unremarkable.  T11-12: Negative.  T12-L1: Shallow left posterior lateral protrusion. Slight indentation left lateral aspect of the thecal sac. Minimal crowding of adjacent  nerve roots.  L1-2:  Negative.  L2-3:  Negative.  L3-4:  Negative.  L4-5: Mild disc degeneration. Bulge which centrally touches but does not cause significant compression of the thecal sac. Left foraminal/lateral disc osteophyte with slight encroachment upon but not significant compression of the exiting left L4 nerve root.  L5-S1: Disc degeneration with disc space narrowing and broad-based disc osteophyte complex. Centrally no compression of the S1 nerve roots or thecal sac. Laterally, extension greater on the right with mild encroachment upon the exiting right L5 nerve root.  IMPRESSION: No significant change from the prior examination.  Summary of pertinent findings includes:  T12-L1 shallow left posterior lateral protrusion. Slight indentation left lateral aspect of the thecal sac. Minimal crowding of adjacent nerve roots.  L4-5 bulge which centrally touches but does not cause significant compression of the thecal sac. Left foraminal/lateral disc osteophyte with slight encroachment upon but not significant compression of the exiting left L4 nerve root.  L5-S1 broad-based disc osteophyte complex. Centrally no compression of the S1 nerve roots or thecal sac. Laterally, extension greater on the right with mild encroachment upon the exiting right L5 nerve root.   Electronically Signed   By: Chauncey Cruel M.D.   On: 02/14/2014 21:37   Dg Chest Port 1 View  02/14/2014   CLINICAL DATA:  Weakness.  EXAM: PORTABLE CHEST - 1 VIEW  COMPARISON:  DG CHEST 1V PORT dated 06/24/2013  FINDINGS: Cardiomediastinal silhouette is unremarkable for this low inspiratory portable examination with crowded vasculature markings. Small linear density in right lung base. The lungs are otherwise clear without pleural effusions or focal consolidations. Trachea projects midline and there is no pneumothorax. Included soft tissue planes and osseous structures are non-suspicious. ACDF. Multiple EKG lines overlie the patient and may obscure subtle  underlying pathology. Interval extubation and removal of nasogastric tube.  IMPRESSION: Minimal right lung base atelectasis versus scarring.   Electronically Signed   By: Elon Alas   On: 02/14/2014 20:04    Scheduled Meds: . enoxaparin (LOVENOX) injection  40 mg Subcutaneous Q24H  . gabapentin  900 mg Oral TID  . ketorolac  10 mg Oral BID  . simvastatin  20 mg Oral QPM  . sodium chloride  3 mL Intravenous Q12H  . tamsulosin  0.8 mg Oral Daily  . tiotropium  18 mcg Inhalation Daily  . traZODone  100 mg Oral QHS   Continuous Infusions: . sodium chloride 75 mL/hr at 02/15/14 0802      Time spent: 25 minutes    Koleson Reifsteck  [facility] Pager [phone] If 7PM-7AM, please contact night-coverage at [email or web address], password [facility] 02/15/2014, 3:12 PM  LOS: 1 [place]

## 2014-02-16 DIAGNOSIS — M545 Low back pain, unspecified: Secondary | ICD-10-CM

## 2014-02-16 LAB — BASIC METABOLIC PANEL
BUN: 51 mg/dL — ABNORMAL HIGH (ref 6–23)
CO2: 20 meq/L (ref 19–32)
CREATININE: 2.27 mg/dL — AB (ref 0.50–1.35)
Calcium: 8.3 mg/dL — ABNORMAL LOW (ref 8.4–10.5)
Chloride: 101 mEq/L (ref 96–112)
GFR calc Af Amer: 36 mL/min — ABNORMAL LOW (ref >90)
GFR calc non Af Amer: 31 mL/min — ABNORMAL LOW (ref >90)
GLUCOSE: 134 mg/dL — AB (ref 70–99)
Potassium: 3.8 mEq/L (ref 3.7–5.3)
Sodium: 139 mEq/L (ref 137–147)

## 2014-02-16 LAB — URINE CULTURE
Colony Count: NO GROWTH
Culture: NO GROWTH

## 2014-02-16 MED ORDER — HYDROCODONE-ACETAMINOPHEN 5-325 MG PO TABS: 2.0000 | ORAL_TABLET | ORAL | Status: DC | PRN

## 2014-02-16 NOTE — Clinical Social Work Note (Signed)
CSW consulted by PT regarding assisting patient with re-establishing services with mental health provider. CSW met with patient who reported he just found out his mother has stage 1 cancer and his brother has complicated medical issues. Patient stated both his mother and brother live 150 miles away and he cannot see them as often as he would like. Patient began to cry and stated he was seeing a therapist, but she decided to leave the agency and sell pharmaceuticals. Patient stated he could not remember the name of the agency and feels the agency just "let me out to dry." CSW verbalized understanding of the above and provided appropriate emotional support. CSW asked patient if he has had any contact with anyone from the agency. Patient stated he has not been contacted by the agency and does not know if they have assigned him a new therapist, but with all that is going on, he really needs to talk to someone. CSW asked patient if he felt safe going home. Patient responded he felt safe. CSW asked patient if he any ideas of harming himself. Patient replied no, he just wants to talk to someone about all that's going on, instead of holding it in. CSW asked patient how were his mental health services coordinated initially. Patient stated he contacted the behavioral health number on the back of his Medicaid card and they arranged everything for him. CSW encouraged patient to contact the same number on the back of his Medicaid card in order to find out what agency he was receiving services with and to follow-up with continuing services with the same agency or coordinating services with a new agency. CSW further provided patient with outpatient mental health services list. CSW discussed with patient triggers that suggest he seek mental health services during a crisis such as with the mobile crisis unit, until he can resume services with current agency. Patient thanked CSW for assisting him. No further needs. CSW signing  off.  [place], [place] Weekend Clinical Social Worker [phone]

## 2014-02-16 NOTE — Progress Notes (Signed)
Physical Therapy Treatment Patient Details Name: [name] [name] MRN: [id number] DOB: [birth year] Today's Date: 02/16/2014    History of Present Illness Adm 4/10 s/p fall (pt reports knees buckled, no syncope) with LOC after hitting his head/neck. Pt with Lt foot drop with numbness distal leg and foot. MRI L-spine showed T12-L1 shallow left posterior lateral protrusion. Slight indentation left lateral aspect of the thecal sac. Minimal crowding of adjacent nerve roots. L4-5 bulge which centrally touches but does not cause significant compression of the thecal sac. Left foraminal/lateral disc osteophyte with slight encroachment upon but not significant compression of the exiting left L4 nerve root. L5-S1 broad-based disc osteophyte complex. Centrally no compression of the S1 nerve roots or thecal sac. Laterally, extension greater on the right with mild encroachment upon the exiting right L5 nerve root. Neurology consult feels no indication for surgery and possible peroneal nerve injury.    PT Comments    Pt improved slightly today with persistent motor and sensory deficits limiting independence.  Gait improved with AFO to left and pt able to ascend/descend stairs.  Pt has multiple medical and social issues and as Medicaid pt, concerned he will not qualify for extensive necessary services to manage pain, and radiculopathy along with current nerve palsy.  Educated pt on self-care exercises. Contacted CSW/RNCM to assist with reconnecting patient with mental health services for depression.   Pt wants to go home today if possible, want to be sure he has all needs met prior to d/c.    Follow Up Recommendations  Home health PT;Other (comment) (Pt medicaid and may not qualify for extensive services)     Equipment Recommendations       Recommendations for Other Services       Precautions / Restrictions Precautions Precautions: Fall Precaution Comments: AFO to left foot in shoe for walking Required Braces  or Orthoses: Other Brace/Splint Other Brace/Splint: AFO Restrictions Other Position/Activity Restrictions: try to keep legs elevated to minimize peripheral edema    Mobility  Bed Mobility Overal bed mobility: Modified Independent             General bed mobility comments: incr time due to Lt buttock pain   Transfers Overall transfer level: Needs assistance Equipment used: Rolling walker (2 wheeled) Transfers: Sit to/from Stand Sit to Stand: Supervision         General transfer comment: pt able to assume standing at RW with incr effort, limited by pain, weakness, limited sensation through left limb and buttock  Ambulation/Gait Ambulation/Gait assistance: Supervision Ambulation Distance (Feet): 125 Feet Assistive device: Rolling walker (2 wheeled) Gait Pattern/deviations: Step-through pattern;Decreased dorsiflexion - left;Antalgic;Trunk flexed   Gait velocity interpretation: <1.8 ft/sec, indicative of risk for recurrent falls General Gait Details: improved indepedence with AFO to left ankle, bil trainers donned.  decr eccentric quad control left knee improves minimally with verbal cues and demonstration, noting tendency to hyperextend in stance.  leans on RW   Stairs Stairs: Yes Stairs assistance: Supervision Stair Management: Two rails;Step to pattern;Forwards Number of Stairs: 10 General stair comments: verbal cue to lead up with intact RIGHT and down with LEFT, pt able to perform safely and adjusts left foot in acending using compensatory circumduction to place foot to stair  Wheelchair Mobility    Modified Rankin (Stroke Patients Only)       Balance Overall balance assessment: History of Falls;Needs assistance Sitting-balance support: No upper extremity supported;Feet supported Sitting balance-Leahy Scale: Normal Sitting balance - Comments: limited by back pain with no apparent balance  issues   Standing balance support: Bilateral upper extremity  supported;During functional activity Standing balance-Leahy Scale: Fair Standing balance comment: limted due to pain/weakness/sensory deficits                    Cognition Arousal/Alertness: Awake/alert Behavior During Therapy: WFL for tasks assessed/performed Overall Cognitive Status: Within Functional Limits for tasks assessed                      Exercises Other Exercises Other Exercises: mckenzie extension exercises x 5 reps to left (centralizes radiating pain) perform twice daily, use heat pack to back for stiffness; Other Exercises: AAROM ankle pumps; quad sets 10 each 5 times daily    General Comments General comments (skin integrity, edema, etc.): edema noted left > right ankle, pt educated on elevate extremities, assisted ankle pumps to left to manage, and doff AFO strap to minimize risk for skin breakdown at ant tib      Pertinent Vitals/Pain 8/10 pain in left limb    Home Living                      Prior Function            PT Goals (current goals can now be found in the care plan section) Acute Rehab PT Goals Patient Stated Goal: no falls, be able to travel to see dying mother PT Goal Formulation: With patient Time For Goal Achievement: 02/22/14 Progress towards PT goals: Progressing toward goals    Frequency  Min 5X/week    PT Plan Current plan remains appropriate    Co-evaluation             End of Session   Activity Tolerance: Patient limited by pain Patient left: in bed;with call bell/phone within reach;with nursing/sitter in room     Time: 5909-3112 PT Time Calculation (min): 63 min  Charges:  $Gait Training: 23-37 mins $Therapeutic Exercise: 8-22 mins $Therapeutic Activity: 8-22 mins                    G Codes:      Herbie Drape 02/16/2014, 10:53 AM

## 2014-02-16 NOTE — Discharge Summary (Addendum)
Physician Discharge Summary  [name] [name] JKD:[id number] DOB: 28-May-1961 DOA: 02/14/2014  PCP: Linna Darner, MD  Admit date: 02/14/2014 Discharge date: 02/16/2014  Time spent: 40 minutes  Recommendations for Outpatient Follow-up:  1. Home with HHPT 2. Patient needs to follow up wth PCP in 1 week. Needs renal function checked . Needs outpt EMG/ NCS in 2-3 weeks  Discharge Diagnoses:  Principal Problem:   Left foot drop  Active Problems:   HTN (hypertension)   Hyperlipidemia   COPD (chronic obstructive pulmonary disease)   Fall at home   Acute kidney injury   Hypokalemia   Leukocytosis   Low back pain   Discharge Condition: fair  Diet recommendation: low sodium  Filed Weights   02/14/14 2208  Weight: 112.991 kg (249 lb 1.6 oz)    History of present illness:  Please refer to admission H&P for details, but in brief, 53 yo male with history of HTN, HLD, COPD, depression, sciatica, presenting to [facility] ED with main concern of several days duration of sudden onset of left foot drop that started after an episode of fall at home. Pt explains his legs gave away and he felt lightheaded and fell hitting his back and neck, head with brief LOC but he is unable to provide more details. He explains he also has low back pain, throbbing and constant, radiating to posterior left thigh, 5/10 in severity. No specific alleviating or aggravating factors. Pt reports similar event of low back pain in the past but not as severe and never associated with foot droop. Pt denies fevers, chills, no specific abdominal or urinary concerns, no chest pain or shortness of breath.  In ED, pt was hemodynamically stable, noted to have new onset renal failure with Cr > 3, vitals notable for soft SBP on admission in 90 - 100's/ . Patient admitted for further management.     Hospital Course:  Fall with left foot drop  Unlikely of a syncopal event. Possibly head injury following a mechanical fall. Stable on telemetry.  According antihypertensive medications. Continue IV fluids  MRI of the lumbar spine shows chronic findings without new or acute compression of the cord.  Patient likely has injury to the left common peroneal nerve causing foot drop. Left ankle-foot orthosis ordered. Seen by physical therapy and recommended home with home health. Pain control with when necessary Vicodin . Appreciate neurology recommendation. Recommended toradol for anti-inflammatory however given his acute kidney injury I would avoid using it.   -Patient needs EMG/ nerve conduction study done as outpatient in about 12-3 weeks   Hypertension  Given acute kidney injury and soft blood pressure HCTZ and lisinopril held on admission . BP stable. Monitor as outpt  Acute kidney injury  Possibly prerenal .FeNa of 0.4. Patient also on HCTZ and ACE inhibitor which had been held. improving  with  IV fluids. (3.07>2.93>2.27). instructed on adequate hydration and  avoid NSAIDs. Needs labs checked within 1 week during outpt follow up.  Hypokalemia  Replenished Monitor   Leukocytosis  Possibly reactive and resolved.  COPD  Stable. Continue inhalers   Hyperlipidemia  Continue statin   Chronic sciatica  patient requesting referral to pain clinic ( was being seen at different pain clinic previously but has not being follow there as he is unhappy with the care). instructed to him that referral can be made by his PCP)  Code Status: Full code  Family Communication: None at bedside   Disposition Plan: Home with oupt PCP follow up  Consultants:  Neurology   Procedures:  MRI of lumbar spine   Antibiotics:  None     Discharge Exam: Filed Vitals:   02/16/14 0534  BP: 145/80  Pulse: 84  Temp: 97.5 F (36.4 C)  Resp: 16   General: Middle aged obese male in no acute distress  HEENT: No pallor, moist oral mucosa  Chest: Clear to auscultation bilaterally, no added sounds  CVS: Normal S1 and S2, no murmurs rub or gallop   Abdomen: Soft, nontender, nondistended, bowel sounds present  Extremities: Warm, no edema, impaired movement of left foot with impaired dorsi and plantar flexion, normal sensations  CNS: AAO x3   Discharge Instructions You were cared for by a hospitalist during your hospital stay. If you have any questions about your discharge medications or the care you received while you were in the hospital after you are discharged, you can call the unit and asked to speak with the hospitalist on call if the hospitalist that took care of you is not available. Once you are discharged, your primary care physician will handle any further medical issues. Please note that NO REFILLS for any discharge medications will be authorized once you are discharged, as it is imperative that you return to your primary care physician (or establish a relationship with a primary care physician if you do not have one) for your aftercare needs so that they can reassess your need for medications and monitor your lab values.     Medication List    STOP taking these medications       hydrochlorothiazide 25 MG tablet  Commonly known as:  HYDRODIURIL     lisinopril 20 MG tablet  Commonly known as:  PRINIVIL,ZESTRIL      TAKE these medications       albuterol 108 (90 BASE) MCG/ACT inhaler  Commonly known as:  PROVENTIL HFA  INHALE TWO PUFFS BY MOUTH INTO LUNGS EVERY 4 HOURS AS NEEDED FOR WHEEZING     butalbital-acetaminophen-caffeine 50-325-40 MG per tablet  Commonly known as:  FIORICET, ESGIC  Take 1 tablet by mouth 2 (two) times daily as needed for headache.     gabapentin 300 MG capsule  Commonly known as:  NEURONTIN  Take 3 capsules (900 mg total) by mouth 3 (three) times daily.     HYDROcodone-acetaminophen 5-325 MG per tablet  Commonly known as:  NORCO/VICODIN  Take 2 tablets by mouth every 4 (four) hours as needed for moderate pain.     simvastatin 20 MG tablet  Commonly known as:  ZOCOR  Take 1 tablet (20 mg  total) by mouth every evening.     tamsulosin 0.4 MG Caps capsule  Commonly known as:  FLOMAX  Take 2 capsules (0.8 mg total) by mouth daily.     tiotropium 18 MCG inhalation capsule  Commonly known as:  SPIRIVA  Place 1 capsule (18 mcg total) into inhaler and inhale daily.     traZODone 100 MG tablet  Commonly known as:  DESYREL  Take 1 tablet (100 mg total) by mouth at bedtime.       Allergies  Allergen Reactions  . Fentanyl     Per pt he had a near overdose on these due to "regulating body temp"  . Methadone Itching       Follow-up Information   Follow up with MERRELL, DAVID, MD In 1 week. (needs EMG/ NCS as outpt)    Specialty:  Family Medicine   Contact information:   [address]  [place] [address] [phone]        The results of significant diagnostics from this hospitalization (including imaging, microbiology, ancillary and laboratory) are listed below for reference.    Significant Diagnostic Studies: Ct Head Wo Contrast  02/14/2014   CLINICAL DATA:  Status post fall  EXAM: CT HEAD WITHOUT CONTRAST  CT CERVICAL SPINE WITHOUT CONTRAST  TECHNIQUE: Multidetector CT imaging of the head and cervical spine was performed following the standard protocol without intravenous contrast. Multiplanar CT image reconstructions of the cervical spine were also generated.  COMPARISON:  None.  In the MR C SPINE WO/W CM dated 04/26/2012; CT C SPINE W/O CM dated 10/11/2010; CT ANGIO HEAD W/CM &/OR WO/CM dated 12/02/2009  FINDINGS: CT HEAD FINDINGS  The ventricles are normal in size and position. There is no intracranial hemorrhage or intracranial mass effect. There is no evidence of an evolving ischemic infarction. The cerebellum and brainstem are normal in density. At bone window settings the observed portions of the paranasal sinuses and mastoid air cells are clear. There is no evidence of an acute skull fracture nor cephalohematoma.  CT CERVICAL SPINE FINDINGS  The patient has  undergone previous anterior spinal fusion and C5-6 and C6-7 with intradiscal device placement. The vertebral bodies are preserved in height. The prevertebral soft tissue spaces appear normal. The metallic hardware appears intact. There is no evidence of a perched facet nor of a spinous process fracture. The odontoid is intact and the lateral masses of C1 align normally with those of C2. The bony ring at each cervical level is intact. The observed portions of the first and second ribs appear normal. The pulmonary apices are clear. The soft tissues of the neck appear normal where visualized.  IMPRESSION: 1. Normal noncontrast CT scan of the brain. 2. There are postfusion changes at C5-6 and C6-7. There is mild degenerative disc bulging at C3-4. There is no evidence of an acute cervical spine fracture nor dislocation.   Electronically Signed   By: David  [place]   On: 02/14/2014 19:12   Ct Cervical Spine Wo Contrast  02/14/2014   CLINICAL DATA:  Status post fall  EXAM: CT HEAD WITHOUT CONTRAST  CT CERVICAL SPINE WITHOUT CONTRAST  TECHNIQUE: Multidetector CT imaging of the head and cervical spine was performed following the standard protocol without intravenous contrast. Multiplanar CT image reconstructions of the cervical spine were also generated.  COMPARISON:  None.  In the MR C SPINE WO/W CM dated 04/26/2012; CT C SPINE W/O CM dated 10/11/2010; CT ANGIO HEAD W/CM &/OR WO/CM dated 12/02/2009  FINDINGS: CT HEAD FINDINGS  The ventricles are normal in size and position. There is no intracranial hemorrhage or intracranial mass effect. There is no evidence of an evolving ischemic infarction. The cerebellum and brainstem are normal in density. At bone window settings the observed portions of the paranasal sinuses and mastoid air cells are clear. There is no evidence of an acute skull fracture nor cephalohematoma.  CT CERVICAL SPINE FINDINGS  The patient has undergone previous anterior spinal fusion and C5-6 and C6-7 with  intradiscal device placement. The vertebral bodies are preserved in height. The prevertebral soft tissue spaces appear normal. The metallic hardware appears intact. There is no evidence of a perched facet nor of a spinous process fracture. The odontoid is intact and the lateral masses of C1 align normally with those of C2. The bony ring at each cervical level is intact. The observed portions of the first and second ribs appear normal.  The pulmonary apices are clear. The soft tissues of the neck appear normal where visualized.  IMPRESSION: 1. Normal noncontrast CT scan of the brain. 2. There are postfusion changes at C5-6 and C6-7. There is mild degenerative disc bulging at C3-4. There is no evidence of an acute cervical spine fracture nor dislocation.   Electronically Signed   By: David  [place]   On: 02/14/2014 19:12   Mr Lumbar Spine Wo Contrast  02/14/2014   CLINICAL DATA:  Low back pain.  Left-sided weakness and numbness.  EXAM: MRI LUMBAR SPINE WITHOUT CONTRAST  TECHNIQUE: Multiplanar, multisequence MR imaging was performed. No intravenous contrast was administered.  COMPARISON:  04/26/2012 MR.  FINDINGS: Last fully open disk space is labeled L5-S1. Present examination incorporates from T11-12 disc space through the S3 level.  Conus L1 level.  Visualized paravertebral structures unremarkable.  T11-12: Negative.  T12-L1: Shallow left posterior lateral protrusion. Slight indentation left lateral aspect of the thecal sac. Minimal crowding of adjacent nerve roots.  L1-2:  Negative.  L2-3:  Negative.  L3-4:  Negative.  L4-5: Mild disc degeneration. Bulge which centrally touches but does not cause significant compression of the thecal sac. Left foraminal/lateral disc osteophyte with slight encroachment upon but not significant compression of the exiting left L4 nerve root.  L5-S1: Disc degeneration with disc space narrowing and broad-based disc osteophyte complex. Centrally no compression of the S1 nerve roots or  thecal sac. Laterally, extension greater on the right with mild encroachment upon the exiting right L5 nerve root.  IMPRESSION: No significant change from the prior examination.  Summary of pertinent findings includes:  T12-L1 shallow left posterior lateral protrusion. Slight indentation left lateral aspect of the thecal sac. Minimal crowding of adjacent nerve roots.  L4-5 bulge which centrally touches but does not cause significant compression of the thecal sac. Left foraminal/lateral disc osteophyte with slight encroachment upon but not significant compression of the exiting left L4 nerve root.  L5-S1 broad-based disc osteophyte complex. Centrally no compression of the S1 nerve roots or thecal sac. Laterally, extension greater on the right with mild encroachment upon the exiting right L5 nerve root.   Electronically Signed   By: Chauncey Cruel M.D.   On: 02/14/2014 21:37   Dg Chest Port 1 View  02/14/2014   CLINICAL DATA:  Weakness.  EXAM: PORTABLE CHEST - 1 VIEW  COMPARISON:  DG CHEST 1V PORT dated 06/24/2013  FINDINGS: Cardiomediastinal silhouette is unremarkable for this low inspiratory portable examination with crowded vasculature markings. Small linear density in right lung base. The lungs are otherwise clear without pleural effusions or focal consolidations. Trachea projects midline and there is no pneumothorax. Included soft tissue planes and osseous structures are non-suspicious. ACDF. Multiple EKG lines overlie the patient and may obscure subtle underlying pathology. Interval extubation and removal of nasogastric tube.  IMPRESSION: Minimal right lung base atelectasis versus scarring.   Electronically Signed   By: Elon Alas   On: 02/14/2014 20:04    Microbiology: Recent Results (from the past 240 hour(s))  URINE CULTURE     Status: None   Collection Time    02/14/14  9:45 PM      Result Value Ref Range Status   Specimen Description URINE, RANDOM   Final   Special Requests NONE   Final    Culture  Setup Time     Final   Value: 02/15/2014 03:47     Performed at [facility]  Final   Value: NO GROWTH     Performed at [facility]   Culture     Final   Value: NO GROWTH     Performed at [facility]   Report Status 02/16/2014 FINAL   Final     Labs: Basic Metabolic Panel:  Recent Labs Lab 02/14/14 1930 02/15/14 1711 02/15/14 2000 02/16/14 1009  NA 131* 135* 137 139  K 3.4* 3.1* 3.4* 3.8  CL 97 95* 98 101  CO2  --  22 23 20   GLUCOSE 86 116* 105* 134*  BUN 52* 61* 64* 51*  CREATININE 3.80* 3.07* 2.93* 2.27*  CALCIUM  --  7.9* 8.0* 8.3*   Liver Function Tests: No results found for this basename: AST, ALT, ALKPHOS, BILITOT, PROT, ALBUMIN,  in the last 168 hours No results found for this basename: LIPASE, AMYLASE,  in the last 168 hours No results found for this basename: AMMONIA,  in the last 168 hours CBC:  Recent Labs Lab 02/14/14 1930 02/14/14 1937 02/15/14 1711 02/15/14 2000  WBC  --  15.6* 12.6* 10.9*  NEUTROABS  --  12.2*  --   --   HGB 16.0 15.0 13.2 13.0  HCT 47.0 41.6 37.1* 36.7*  MCV  --  92.7 93.0 93.1  PLT  --  182 175 164   Cardiac Enzymes: No results found for this basename: CKTOTAL, CKMB, CKMBINDEX, TROPONINI,  in the last 168 hours BNP: BNP (last 3 results) No results found for this basename: PROBNP,  in the last 8760 hours CBG: No results found for this basename: GLUCAP,  in the last 168 hours     Signed:  Milaya Hora  [facility] 02/16/2014, 12:20 PM

## 2014-02-16 NOTE — Progress Notes (Signed)
Subjective: Patient at the side of the bed eating breakfast.  Reports improvement in pain today to a 8/10.  Appears on no distress.  Reports still unable to move his foot on the left but has had intermittent paresthesias.  Has not had a chance to use his AFO with therapy.    Objective: Current vital signs: BP 145/80  Pulse 84  Temp(Src) 97.5 F (36.4 C) (Oral)  Resp 16  Ht 5\' 11"  (1.803 m)  Wt 112.991 kg (249 lb 1.6 oz)  BMI 34.76 kg/m2  SpO2 98% Vital signs in last 24 hours: Temp:  [97.5 F (36.4 C)-98.5 F (36.9 C)] 97.5 F (36.4 C) (04/12 0534) Pulse Rate:  [84-93] 84 (04/12 0534) Resp:  [16] 16 (04/12 0534) BP: (122-145)/(68-80) 145/80 mmHg (04/12 0534) SpO2:  [94 %-98 %] 98 % (04/12 0534)  Intake/Output from previous [place]: 04/11 0701 - 04/12 0700 In: 360 [P.O.:360] Out: 2426 [Urine:2425; Stool:1] Intake/Output this shift:   Nutritional status: General  Neurologic Exam: Mental Status:  Alert, oriented, thought content appropriate. Speech fluent without evidence of aphasia. Able to follow 3 step commands without difficulty.  Cranial Nerves:  II: Discs flat bilaterally; Visual fields grossly normal, pupils equal, round, reactive to light and accommodation  III,IV, VI: ptosis not present, extra-ocular motions intact bilaterally  V,VII: smile symmetric, facial light touch sensation normal bilaterally  VIII: hearing normal bilaterally  IX,X: gag reflex present  XI: bilateral shoulder shrug  XII: midline tongue extension without atrophy or fasciculations  Motor:  There is pain involved but he is 5/5 bilateral LE proximally, 5/5 knee flexors-extensors bilaterally and the right ankle flexors and extensors. 0/5 left extensor hallucis longus, foot invertors-evertors, as well as 0/5 left foot dorsiflexors and plantar extensors.  5/5 upper ext bilaterally  Tone and bulk:normal tone throughout; no atrophy noted  Sensory: Pinprick and light touch intact diminished dorsum and inner  aspect left foot as well as left big toe.  Deep Tendon Reflexes:  2+ throughout with an absent left AJ  Plantars:  Right: downgoing   Left: downgoing   Lab Results: Basic Metabolic Panel:  Recent Labs Lab 02/14/14 1930 02/15/14 1711 02/15/14 2000  NA 131* 135* 137  K 3.4* 3.1* 3.4*  CL 97 95* 98  CO2  --  22 23  GLUCOSE 86 116* 105*  BUN 52* 61* 64*  CREATININE 3.80* 3.07* 2.93*  CALCIUM  --  7.9* 8.0*    Liver Function Tests: No results found for this basename: AST, ALT, ALKPHOS, BILITOT, PROT, ALBUMIN,  in the last 168 hours No results found for this basename: LIPASE, AMYLASE,  in the last 168 hours No results found for this basename: AMMONIA,  in the last 168 hours  CBC:  Recent Labs Lab 02/14/14 1930 02/14/14 1937 02/15/14 1711 02/15/14 2000  WBC  --  15.6* 12.6* 10.9*  NEUTROABS  --  12.2*  --   --   HGB 16.0 15.0 13.2 13.0  HCT 47.0 41.6 37.1* 36.7*  MCV  --  92.7 93.0 93.1  PLT  --  182 175 164    Cardiac Enzymes: No results found for this basename: CKTOTAL, CKMB, CKMBINDEX, TROPONINI,  in the last 168 hours  Lipid Panel: No results found for this basename: CHOL, TRIG, HDL, CHOLHDL, VLDL, LDLCALC,  in the last 168 hours  CBG: No results found for this basename: GLUCAP,  in the last 168 hours  Microbiology: Results for orders placed during the hospital encounter of 02/14/14  URINE CULTURE     Status: None   Collection Time    02/14/14  9:45 PM      Result Value Ref Range Status   Specimen Description URINE, RANDOM   Final   Special Requests NONE   Final   Culture  Setup Time     Final   Value: 02/15/2014 03:47     Performed at [place]     Final   Value: NO GROWTH     Performed at [facility]   Culture     Final   Value: NO GROWTH     Performed at [facility]   Report Status 02/16/2014 FINAL   Final    Coagulation Studies: No results found for this basename: LABPROT, INR,  in the last 72  hours  Imaging: Ct Head Wo Contrast  02/14/2014   CLINICAL DATA:  Status post fall  EXAM: CT HEAD WITHOUT CONTRAST  CT CERVICAL SPINE WITHOUT CONTRAST  TECHNIQUE: Multidetector CT imaging of the head and cervical spine was performed following the standard protocol without intravenous contrast. Multiplanar CT image reconstructions of the cervical spine were also generated.  COMPARISON:  None.  In the MR C SPINE WO/W CM dated 04/26/2012; CT C SPINE W/O CM dated 10/11/2010; CT ANGIO HEAD W/CM &/OR WO/CM dated 12/02/2009  FINDINGS: CT HEAD FINDINGS  The ventricles are normal in size and position. There is no intracranial hemorrhage or intracranial mass effect. There is no evidence of an evolving ischemic infarction. The cerebellum and brainstem are normal in density. At bone window settings the observed portions of the paranasal sinuses and mastoid air cells are clear. There is no evidence of an acute skull fracture nor cephalohematoma.  CT CERVICAL SPINE FINDINGS  The patient has undergone previous anterior spinal fusion and C5-6 and C6-7 with intradiscal device placement. The vertebral bodies are preserved in height. The prevertebral soft tissue spaces appear normal. The metallic hardware appears intact. There is no evidence of a perched facet nor of a spinous process fracture. The odontoid is intact and the lateral masses of C1 align normally with those of C2. The bony ring at each cervical level is intact. The observed portions of the first and second ribs appear normal. The pulmonary apices are clear. The soft tissues of the neck appear normal where visualized.  IMPRESSION: 1. Normal noncontrast CT scan of the brain. 2. There are postfusion changes at C5-6 and C6-7. There is mild degenerative disc bulging at C3-4. There is no evidence of an acute cervical spine fracture nor dislocation.   Electronically Signed   By: David  Martinique   On: 02/14/2014 19:12   Ct Cervical Spine Wo Contrast  02/14/2014   CLINICAL  DATA:  Status post fall  EXAM: CT HEAD WITHOUT CONTRAST  CT CERVICAL SPINE WITHOUT CONTRAST  TECHNIQUE: Multidetector CT imaging of the head and cervical spine was performed following the standard protocol without intravenous contrast. Multiplanar CT image reconstructions of the cervical spine were also generated.  COMPARISON:  None.  In the MR C SPINE WO/W CM dated 04/26/2012; CT C SPINE W/O CM dated 10/11/2010; CT ANGIO HEAD W/CM &/OR WO/CM dated 12/02/2009  FINDINGS: CT HEAD FINDINGS  The ventricles are normal in size and position. There is no intracranial hemorrhage or intracranial mass effect. There is no evidence of an evolving ischemic infarction. The cerebellum and brainstem are normal in density. At bone window settings the observed portions of the  paranasal sinuses and mastoid air cells are clear. There is no evidence of an acute skull fracture nor cephalohematoma.  CT CERVICAL SPINE FINDINGS  The patient has undergone previous anterior spinal fusion and C5-6 and C6-7 with intradiscal device placement. The vertebral bodies are preserved in height. The prevertebral soft tissue spaces appear normal. The metallic hardware appears intact. There is no evidence of a perched facet nor of a spinous process fracture. The odontoid is intact and the lateral masses of C1 align normally with those of C2. The bony ring at each cervical level is intact. The observed portions of the first and second ribs appear normal. The pulmonary apices are clear. The soft tissues of the neck appear normal where visualized.  IMPRESSION: 1. Normal noncontrast CT scan of the brain. 2. There are postfusion changes at C5-6 and C6-7. There is mild degenerative disc bulging at C3-4. There is no evidence of an acute cervical spine fracture nor dislocation.   Electronically Signed   By: David  Martinique   On: 02/14/2014 19:12   Mr Lumbar Spine Wo Contrast  02/14/2014   CLINICAL DATA:  Low back pain.  Left-sided weakness and numbness.  EXAM: MRI  LUMBAR SPINE WITHOUT CONTRAST  TECHNIQUE: Multiplanar, multisequence MR imaging was performed. No intravenous contrast was administered.  COMPARISON:  04/26/2012 MR.  FINDINGS: Last fully open disk space is labeled L5-S1. Present examination incorporates from T11-12 disc space through the S3 level.  Conus L1 level.  Visualized paravertebral structures unremarkable.  T11-12: Negative.  T12-L1: Shallow left posterior lateral protrusion. Slight indentation left lateral aspect of the thecal sac. Minimal crowding of adjacent nerve roots.  L1-2:  Negative.  L2-3:  Negative.  L3-4:  Negative.  L4-5: Mild disc degeneration. Bulge which centrally touches but does not cause significant compression of the thecal sac. Left foraminal/lateral disc osteophyte with slight encroachment upon but not significant compression of the exiting left L4 nerve root.  L5-S1: Disc degeneration with disc space narrowing and broad-based disc osteophyte complex. Centrally no compression of the S1 nerve roots or thecal sac. Laterally, extension greater on the right with mild encroachment upon the exiting right L5 nerve root.  IMPRESSION: No significant change from the prior examination.  Summary of pertinent findings includes:  T12-L1 shallow left posterior lateral protrusion. Slight indentation left lateral aspect of the thecal sac. Minimal crowding of adjacent nerve roots.  L4-5 bulge which centrally touches but does not cause significant compression of the thecal sac. Left foraminal/lateral disc osteophyte with slight encroachment upon but not significant compression of the exiting left L4 nerve root.  L5-S1 broad-based disc osteophyte complex. Centrally no compression of the S1 nerve roots or thecal sac. Laterally, extension greater on the right with mild encroachment upon the exiting right L5 nerve root.   Electronically Signed   By: Chauncey Cruel M.D.   On: 02/14/2014 21:37   Dg Chest Port 1 View  02/14/2014   CLINICAL DATA:  Weakness.   EXAM: PORTABLE CHEST - 1 VIEW  COMPARISON:  DG CHEST 1V PORT dated 06/24/2013  FINDINGS: Cardiomediastinal silhouette is unremarkable for this low inspiratory portable examination with crowded vasculature markings. Small linear density in right lung base. The lungs are otherwise clear without pleural effusions or focal consolidations. Trachea projects midline and there is no pneumothorax. Included soft tissue planes and osseous structures are non-suspicious. ACDF. Multiple EKG lines overlie the patient and may obscure subtle underlying pathology. Interval extubation and removal of nasogastric tube.  IMPRESSION: Minimal right  lung base atelectasis versus scarring.   Electronically Signed   By: Elon Alas   On: 02/14/2014 20:04    Medications:  I have reviewed the patient's current medications. Scheduled: . gabapentin  900 mg Oral TID  . heparin subcutaneous  5,000 Units Subcutaneous 3 times per [place]  . simvastatin  20 mg Oral QPM  . sodium chloride  3 mL Intravenous Q12H  . tamsulosin  0.8 mg Oral Daily  . tiotropium  18 mcg Inhalation Daily  . traZODone  100 mg Oral QHS    Assessment/Plan: Pain improved.  Sensory changes may actually represent some evidence of initial healing of a compressive nerve injury.  Motor function remains severely impaired.    Recommendations: 1.  Agree with continued therapy.   2.  Nerve conduction testing on an outpatient basis may be helpful to determine if mild abnormalities noted on imaging may be causing more physiological issues that would warrant surgery.  Patient is very interested in having surgery.     LOS: 2 days   Alexis Goodell, MD [facility] [phone] 02/16/2014  9:53 AM

## 2014-02-16 NOTE — Discharge Instructions (Signed)
Acute Kidney Injury Acute kidney injury is a disease in which there is sudden (acute) damage to the kidneys. The kidneys are 2 organs that lie on either side of the spine between the middle of the back and the front of the abdomen. The kidneys:  Remove wastes and extra water from the blood.   Produce important hormones. These help keep bones strong, regulate blood pressure, and help create red blood cells.   Balance the fluids and chemicals in the blood and tissues. A small amount of kidney damage may not cause problems, but a large amount of damage may make it difficult or impossible for the kidneys to work the way they should. Acute kidney injury may develop into long-lasting (chronic) kidney disease. It may also develop into a life-threatening disease called end-stage kidney disease. Acute kidney injury can get worse very quickly, so it should be treated right away. Early treatment may prevent other kidney diseases from developing.  CAUSES   A problem with blood flow to the kidneys. This may be caused by:   Blood loss.   Heart disease.   Severe burns.   Liver disease.  Direct damage to the kidneys. This may be caused by:  Some medicines.   A kidney infection.   Poisoning or consuming toxic substances.   A surgical wound.   A blow to the kidney area.   A problem with urine flow. This may be caused by:   Cancer.   Kidney stones.   An enlarged prostate. SYMPTOMS   Swelling (edema) of the legs, ankles, or feet.   Tiredness (lethargy).   Nausea or vomiting.   Confusion.   Problems with urination, such as:   Painful or burning feeling during urination.   Decreased urine production.   Frequent accidents in children who are potty trained.   Bloody urine.   Muscle twitches and cramps.   Shortness of breath.   Seizures.   Chest pain or pressure. Sometimes, no symptoms are present. DIAGNOSIS Acute kidney injury may be detected  and diagnosed by tests, including blood, urine, imaging, or kidney biopsy tests.  TREATMENT Treatment of acute kidney injury varies depending on the cause and severity of the kidney damage. In mild cases, no treatment may be needed. The kidneys may heal on their own. If acute kidney injury is more severe, your caregiver will treat the cause of the kidney damage, help the kidneys heal, and prevent complications from occurring. Severe cases may require a procedure to remove toxic wastes from the body (dialysis) or surgery to repair kidney damage. Surgery may involve:   Repair of a torn kidney.   Removal of an obstruction. Most of the time, you will need to stay overnight at the hospital.  HOME CARE INSTRUCTIONS:  Follow your prescribed diet.  Only take over-the-counter or prescription medicines as directed by your caregiver.  Do not take any new medicines (prescription, over-the-counter, or nutritional supplements) unless approved by your caregiver. Many medicines can worsen your kidney damage or need to have the dose adjusted.   Keep all follow-up appointments as directed by your caregiver.  Observe your condition to make sure you are healing as expected. SEEK IMMEDIATE MEDICAL CARE IF:  You are feeling ill or have severe pain in the back or side.   Your symptoms return or you have new symptoms.  You have any symptoms of end-stage kidney disease. These include:   Persistent itchiness.   Loss of appetite.   Headaches.   Abnormally dark  or light skin.  Numbness in the hands or feet.   Easy bruising.   Frequent hiccups.   Menstruation stops.   You have a fever.  You have increased urine production.  You have pain or bleeding when urinating. MAKE SURE YOU:   Understand these instructions.  Will watch your condition.  Will get help right away if you are not doing well or get worse Document Released: 05/09/2011 Document Revised: 02/18/2013 Document  Reviewed: 06/22/2012 [facility] Patient Information 2014 [place].

## 2014-02-17 NOTE — Care Management Note (Signed)
1412 02-17-14 CM did call pt in regards to [facility] order and pt does not have a qualifying diagnosis for HHPT/OT. Pt would have to pay out of pocket and per pt he is unable to do so at this time. No further needs from CM a this time. Ocie Cornfield [place], RN,BSN [phone]

## 2014-02-19 ENCOUNTER — Other Ambulatory Visit: Payer: Self-pay

## 2014-02-19 ENCOUNTER — Emergency Department (HOSPITAL_COMMUNITY): Payer: Medicaid Other

## 2014-02-19 ENCOUNTER — Inpatient Hospital Stay (HOSPITAL_COMMUNITY)
Admission: EM | Admit: 2014-02-19 | Discharge: 2014-02-22 | DRG: 176 | Disposition: A | Discharge status: Home / Self Care | Payer: Medicaid Other | Attending: Family Medicine | Admitting: Family Medicine

## 2014-02-19 ENCOUNTER — Encounter (HOSPITAL_COMMUNITY): Payer: Self-pay | Admitting: Emergency Medicine

## 2014-02-19 DIAGNOSIS — F3289 Other specified depressive episodes: Secondary | ICD-10-CM | POA: Diagnosis present

## 2014-02-19 DIAGNOSIS — Z720 Tobacco use: Secondary | ICD-10-CM

## 2014-02-19 DIAGNOSIS — I82409 Acute embolism and thrombosis of unspecified deep veins of unspecified lower extremity: Secondary | ICD-10-CM

## 2014-02-19 DIAGNOSIS — G8929 Other chronic pain: Secondary | ICD-10-CM

## 2014-02-19 DIAGNOSIS — D72829 Elevated white blood cell count, unspecified: Secondary | ICD-10-CM

## 2014-02-19 DIAGNOSIS — I2692 Saddle embolus of pulmonary artery without acute cor pulmonale: Principal | ICD-10-CM | POA: Diagnosis present

## 2014-02-19 DIAGNOSIS — F329 Major depressive disorder, single episode, unspecified: Secondary | ICD-10-CM | POA: Diagnosis present

## 2014-02-19 DIAGNOSIS — J449 Chronic obstructive pulmonary disease, unspecified: Secondary | ICD-10-CM

## 2014-02-19 DIAGNOSIS — F17200 Nicotine dependence, unspecified, uncomplicated: Secondary | ICD-10-CM | POA: Diagnosis present

## 2014-02-19 DIAGNOSIS — M5430 Sciatica, unspecified side: Secondary | ICD-10-CM | POA: Diagnosis present

## 2014-02-19 DIAGNOSIS — J4489 Other specified chronic obstructive pulmonary disease: Secondary | ICD-10-CM | POA: Diagnosis present

## 2014-02-19 DIAGNOSIS — M21372 Foot drop, left foot: Secondary | ICD-10-CM

## 2014-02-19 DIAGNOSIS — M216X9 Other acquired deformities of unspecified foot: Secondary | ICD-10-CM | POA: Diagnosis present

## 2014-02-19 DIAGNOSIS — I10 Essential (primary) hypertension: Secondary | ICD-10-CM

## 2014-02-19 DIAGNOSIS — I824Z9 Acute embolism and thrombosis of unspecified deep veins of unspecified distal lower extremity: Secondary | ICD-10-CM | POA: Diagnosis present

## 2014-02-19 DIAGNOSIS — M25569 Pain in unspecified knee: Secondary | ICD-10-CM | POA: Diagnosis present

## 2014-02-19 DIAGNOSIS — W19XXXA Unspecified fall, initial encounter: Secondary | ICD-10-CM

## 2014-02-19 DIAGNOSIS — E785 Hyperlipidemia, unspecified: Secondary | ICD-10-CM | POA: Diagnosis present

## 2014-02-19 DIAGNOSIS — E876 Hypokalemia: Secondary | ICD-10-CM

## 2014-02-19 DIAGNOSIS — I824Y9 Acute embolism and thrombosis of unspecified deep veins of unspecified proximal lower extremity: Secondary | ICD-10-CM | POA: Diagnosis present

## 2014-02-19 DIAGNOSIS — Z79899 Other long term (current) drug therapy: Secondary | ICD-10-CM

## 2014-02-19 LAB — COMPREHENSIVE METABOLIC PANEL
ALK PHOS: 66 U/L (ref 39–117)
ALT: 58 U/L — ABNORMAL HIGH (ref 0–53)
AST: 47 U/L — ABNORMAL HIGH (ref 0–37)
Albumin: 2.8 g/dL — ABNORMAL LOW (ref 3.5–5.2)
BILIRUBIN TOTAL: 0.2 mg/dL — AB (ref 0.3–1.2)
BUN: 19 mg/dL (ref 6–23)
CHLORIDE: 102 meq/L (ref 96–112)
CO2: 24 mEq/L (ref 19–32)
Calcium: 8.8 mg/dL (ref 8.4–10.5)
Creatinine, Ser: 1.03 mg/dL (ref 0.50–1.35)
GFR calc Af Amer: >90 mL/min (ref >90)
GFR, EST NON AFRICAN AMERICAN: 82 mL/min — AB (ref >90)
Glucose, Bld: 92 mg/dL (ref 70–99)
Potassium: 3.6 mEq/L — ABNORMAL LOW (ref 3.7–5.3)
Sodium: 140 mEq/L (ref 137–147)
Total Protein: 6.2 g/dL (ref 6.0–8.3)

## 2014-02-19 LAB — CBC WITH DIFFERENTIAL/PLATELET
BASOS PCT: 0 % (ref 0–1)
Basophils Absolute: 0 10*3/uL (ref 0.0–0.1)
Eosinophils Absolute: 0.4 10*3/uL (ref 0.0–0.7)
Eosinophils Relative: 3 % (ref 0–5)
HEMATOCRIT: 39.6 % (ref 39.0–52.0)
HEMOGLOBIN: 14 g/dL (ref 13.0–17.0)
Lymphocytes Relative: 38 % (ref 12–46)
Lymphs Abs: 5.2 10*3/uL — ABNORMAL HIGH (ref 0.7–4.0)
MCH: 33.3 pg (ref 26.0–34.0)
MCHC: 35.4 g/dL (ref 30.0–36.0)
MCV: 94.1 fL (ref 78.0–100.0)
MONO ABS: 0.9 10*3/uL (ref 0.1–1.0)
Monocytes Relative: 7 % (ref 3–12)
NEUTROS ABS: 7.2 10*3/uL (ref 1.7–7.7)
Neutrophils Relative %: 52 % (ref 43–77)
Platelets: 201 10*3/uL (ref 150–400)
RBC: 4.21 MIL/uL — ABNORMAL LOW (ref 4.22–5.81)
RDW: 12.5 % (ref 11.5–15.5)
WBC: 13.7 10*3/uL — ABNORMAL HIGH (ref 4.0–10.5)

## 2014-02-19 LAB — I-STAT TROPONIN, ED: TROPONIN I, POC: 0 ng/mL (ref 0.00–0.08)

## 2014-02-19 LAB — D-DIMER, QUANTITATIVE (NOT AT ARMC): D DIMER QUANT: 7.63 ug{FEU}/mL — AB (ref 0.00–0.48)

## 2014-02-19 LAB — LIPASE, BLOOD: Lipase: 86 U/L — ABNORMAL HIGH (ref 11–59)

## 2014-02-19 MED ORDER — HYDROMORPHONE HCL PF 1 MG/ML IJ SOLN
1.0000 mg | Freq: Once | INTRAMUSCULAR | Status: AC
  Administered 2014-02-19: 1 mg via INTRAVENOUS
  Filled 2014-02-19: qty 1

## 2014-02-19 MED ORDER — SODIUM CHLORIDE 0.9 % IV BOLUS (SEPSIS)
1000.0000 mL | Freq: Once | INTRAVENOUS | Status: AC
  Administered 2014-02-19: 1000 mL via INTRAVENOUS

## 2014-02-19 NOTE — ED Provider Notes (Signed)
CSN: [id number]     Arrival date & time 02/19/14  2148 History   First MD Initiated Contact with Patient 02/19/14 2200     Chief Complaint  Patient presents with  . Chest Pain  . Shortness of Breath     (Consider location/radiation/quality/duration/timing/severity/associated sxs/prior Treatment) Patient is a 53 y.o. male presenting with chest pain and shortness of breath. The history is provided by the patient.  Chest Pain Pain location:  L chest Pain quality: sharp and stabbing   Pain radiates to:  Does not radiate Pain radiates to the back: no   Pain severity:  Severe Onset quality:  Sudden Duration:  10 hours Timing:  Constant Progression:  Unchanged Chronicity:  New Context comment:  Started at noon today while doing nothing Relieved by:  Nothing Worsened by:  Deep breathing, coughing and movement Ineffective treatments: vicodin. Associated symptoms: abdominal pain, cough and shortness of breath   Associated symptoms: no back pain, no nausea, no palpitations, not vomiting and no weakness   Associated symptoms comment:  Mild dry cough today Risk factors: hypertension and smoking   Risk factors: no diabetes mellitus and no high cholesterol   Shortness of Breath Associated symptoms: abdominal pain, chest pain and cough   Associated symptoms: no vomiting     Past Medical History  Diagnosis Date  . Constipation due to pain medication   . COPD (chronic obstructive pulmonary disease)   . Status post spinal surgery   . Hypertension   . Anxiety    Past Surgical History  Procedure Laterality Date  . Spinal fixation surgery     Family History  Problem Relation Age of Onset  . Cancer Neg Hx    History  Substance Use Topics  . Smoking status: Current Every [place] Smoker -- 1.00 packs/[place]    Types: Cigarettes  . Smokeless tobacco: Not on file     Comment: decreased to 1ppd  . Alcohol Use: Not on file    Review of Systems  Respiratory: Positive for cough and shortness  of breath.   Cardiovascular: Positive for chest pain. Negative for palpitations.  Gastrointestinal: Positive for abdominal pain. Negative for nausea and vomiting.  Musculoskeletal: Negative for back pain.  Neurological: Negative for weakness.  All other systems reviewed and are negative.     Allergies  Fentanyl and Methadone  Home Medications   Prior to Admission medications   Medication Sig Start Date End Date Taking? Authorizing Provider  albuterol (PROVENTIL HFA) 108 (90 BASE) MCG/ACT inhaler INHALE TWO PUFFS BY MOUTH INTO LUNGS EVERY 4 HOURS AS NEEDED FOR WHEEZING 07/18/13   Waldemar [name], MD  butalbital-acetaminophen-caffeine (FIORICET, ESGIC) [phone] MG per tablet Take 1 tablet by mouth 2 (two) times daily as needed for headache. 10/23/13   Waldemar [name], MD  gabapentin (NEURONTIN) 300 MG capsule Take 3 capsules (900 mg total) by mouth 3 (three) times daily. 01/03/14   Waldemar [name], MD  HYDROcodone-acetaminophen (NORCO/VICODIN) 5-325 MG per tablet Take 2 tablets by mouth every 4 (four) hours as needed for moderate pain. 02/16/14   Nishant Dhungel, MD  simvastatin (ZOCOR) 20 MG tablet Take 1 tablet (20 mg total) by mouth every evening. 04/22/13 04/22/14  Waldemar [name], MD  tamsulosin (FLOMAX) 0.4 MG CAPS capsule Take 2 capsules (0.8 mg total) by mouth daily. 12/11/13   Waldemar [name], MD  tiotropium (SPIRIVA) 18 MCG inhalation capsule Place 1 capsule (18 mcg total) into inhaler and inhale daily. 12/25/12 02/14/14  Grayling Congress  Marily Memos, MD  traZODone (DESYREL) 100 MG tablet Take 1 tablet (100 mg total) by mouth at bedtime. 10/23/13 02/26/00  Waldemar [name], MD   BP 132/83  Pulse 103  Temp(Src) 97.8 F (36.6 C) (Oral)  Resp 30  SpO2 97% Physical Exam  Nursing note and vitals reviewed. Constitutional: He is oriented to person, place, and time. He appears well-developed and well-nourished. He appears distressed.  Appears to be in pain  HENT:  Head: Normocephalic and atraumatic.    Mouth/Throat: Oropharynx is clear and moist.  Eyes: Conjunctivae and EOM are normal. Pupils are equal, round, and reactive to light.  Neck: Normal range of motion. Neck supple.  Cardiovascular: Normal rate, regular rhythm and intact distal pulses.   No murmur heard. Pulmonary/Chest: Effort normal and breath sounds normal. No respiratory distress. He has no wheezes. He has no rales. He exhibits tenderness. He exhibits no crepitus.    Abdominal: Soft. He exhibits no distension. There is tenderness in the left upper quadrant. There is no rebound and no guarding.  Musculoskeletal: Normal range of motion. He exhibits no edema and no tenderness.  Neurological: He is alert and oriented to person, place, and time.  Skin: Skin is warm and dry. No rash noted. No erythema.  Psychiatric: He has a normal mood and affect. His behavior is normal.    ED Course  Procedures (including critical care time) Labs Review Labs Reviewed  CBC WITH DIFFERENTIAL - Abnormal; Notable for the following:    WBC 13.7 (*)    RBC 4.21 (*)    Lymphs Abs 5.2 (*)    All other components within normal limits  COMPREHENSIVE METABOLIC PANEL - Abnormal; Notable for the following:    Potassium 3.6 (*)    Albumin 2.8 (*)    AST 47 (*)    ALT 58 (*)    Total Bilirubin 0.2 (*)    GFR calc non Af Amer 82 (*)    All other components within normal limits  D-DIMER, QUANTITATIVE - Abnormal; Notable for the following:    D-Dimer, Quant 7.63 (*)    All other components within normal limits  LIPASE, BLOOD - Abnormal; Notable for the following:    Lipase 86 (*)    All other components within normal limits  I-STAT TROPOININ, ED    Imaging Review Dg Chest 2 View  02/19/2014   CLINICAL DATA:  Chest pain  EXAM: CHEST  2 VIEW  COMPARISON:  02/14/2014  FINDINGS: Heart size is normal. Mediastinal shadows are normal. The lungs are clear. No effusions. No bony abnormalities.  IMPRESSION: No active cardiopulmonary disease.    Electronically Signed   By: Nelson Chimes M.D.   On: 02/19/2014 23:06     Date: 02/19/2014  Rate: 97  Rhythm: normal sinus rhythm  QRS Axis: normal  Intervals: normal  ST/T Wave abnormalities: normal  Conduction Disutrbances:none  Narrative Interpretation:   Old EKG Reviewed: unchanged   MDM   Final diagnoses:  None    Patient here with acute onset of severe pleuritic left chest pain and left upper quadrant pain that is worse with breathing, coughing and movement. He is taking Vicodin at home without improvement. He has used his inhaler as well without improvement. He denies wheezing. He was coughing mildly earlier but no severe cough. He has reproducible tenderness in his left lower chest as well as left upper quadrant. He denies alcohol use or prior history of pancreatitis or abdominal issues. Patient was recently hospitalized and  discharged 2 days ago after evaluation for acute lumbar radiculopathy and foot drop. He denies ever having chest pain like this in the past. No prior cardiac issues that he is aware of but does have a family history. Patient's EKG is within normal limits and based on his story low suspicion for cardiac etiology at this time however symptoms started around noon today and they have been ongoing and persistent for the last 10 hours so feel 1 troponin will be adequate for ruling him out. Concern for PE patient is tachycardic and tachypneic with pain and recent hospitalization versus pleurisy versus COPD exacerbation versus cardiac etiology. COPD exacerbation cardiac etiology are low at this time. Patient denies any history of infectious etiology.  Patient given pain control. CBC, CMP, d-dimer, lipase, troponin, chest x-ray pending  11:33 PM Labs significant for mild leukocytosis of 13,000 and elevated dimer of 7.6.  Now pt has normal Cr of 1.03.  Will give iVF and CTA of chest to r/o pe. Pt checked out to Dr. Leonides Schanz at 23:30.  Blanchie Dessert, MD 02/19/14 [phone]

## 2014-02-19 NOTE — ED Provider Notes (Signed)
12:00 AM  Assumed care from Dr. Maryan Rued.  He should is a 53 year old male with a history of COPD, hypertension who was recently admitted to the hospital who comes in with chest pain is worse with deep inspiration. His labs show leukocytosis. His troponin is negative. Patient has mildly elevated lipase but also significantly elevated d-dimer. Will obtain CT of his chest to rule out PE.  1:03 AM  Pt has saddle pulmonary embolus without signs of right heart strain. Will repeat EKG. He is still hemodynamically stable and has no oxygen requirement. We'll give morphine for pain. Have placed patient on oxygen for comfort. His PCP is Dr. Marily Memos with family medicine. We'll discuss with family medicine for admission to step down. Will start heparin. No contraindications to anticoagulation.   1:16 AM  D/w FM resident for admission to stepdown.  1:40 AM  Pt is still hemodynamically stable.   CRITICAL CARE Performed by: Delice Bison Sakinah Rosamond   Total critical care time: 30 minutes  Critical care time was exclusive of separately billable procedures and treating other patients.  Critical care was necessary to treat or prevent imminent or life-threatening deterioration.  Critical care was time spent personally by me on the following activities: development of treatment plan with patient and/or surrogate as well as nursing, discussions with consultants, evaluation of patient's response to treatment, examination of patient, obtaining history from patient or surrogate, ordering and performing treatments and interventions, ordering and review of laboratory studies, ordering and review of radiographic studies, pulse oximetry and re-evaluation of patient's condition.     Date: 02/20/2014 1:26 AM  Rate: 92  Rhythm: normal sinus rhythm  QRS Axis: normal  Intervals: normal  ST/T Wave abnormalities: normal  Conduction Disutrbances: none  Narrative Interpretation: unremarkable; shows Q waves in lead III which are old  compared to prior EKG in 2014, no other signs of right heart strain, no ischemic changes      [place], DO 02/20/14 [phone]

## 2014-02-19 NOTE — ED Notes (Signed)
Pt just released from hospital Sunday; severe chest pain and SOB worsening today.

## 2014-02-20 ENCOUNTER — Encounter (HOSPITAL_COMMUNITY): Payer: Self-pay | Admitting: Radiology

## 2014-02-20 ENCOUNTER — Ambulatory Visit: Payer: Self-pay | Admitting: Family Medicine

## 2014-02-20 DIAGNOSIS — E876 Hypokalemia: Secondary | ICD-10-CM

## 2014-02-20 DIAGNOSIS — I2692 Saddle embolus of pulmonary artery without acute cor pulmonale: Principal | ICD-10-CM

## 2014-02-20 DIAGNOSIS — F17200 Nicotine dependence, unspecified, uncomplicated: Secondary | ICD-10-CM

## 2014-02-20 DIAGNOSIS — I10 Essential (primary) hypertension: Secondary | ICD-10-CM

## 2014-02-20 DIAGNOSIS — D72829 Elevated white blood cell count, unspecified: Secondary | ICD-10-CM

## 2014-02-20 DIAGNOSIS — E785 Hyperlipidemia, unspecified: Secondary | ICD-10-CM

## 2014-02-20 DIAGNOSIS — J449 Chronic obstructive pulmonary disease, unspecified: Secondary | ICD-10-CM

## 2014-02-20 LAB — COMPREHENSIVE METABOLIC PANEL
ALT: 48 U/L (ref 0–53)
AST: 34 U/L (ref 0–37)
Albumin: 2.7 g/dL — ABNORMAL LOW (ref 3.5–5.2)
Alkaline Phosphatase: 62 U/L (ref 39–117)
BILIRUBIN TOTAL: 0.3 mg/dL (ref 0.3–1.2)
BUN: 15 mg/dL (ref 6–23)
CO2: 22 meq/L (ref 19–32)
Calcium: 8.1 mg/dL — ABNORMAL LOW (ref 8.4–10.5)
Chloride: 101 mEq/L (ref 96–112)
Creatinine, Ser: 0.88 mg/dL (ref 0.50–1.35)
GFR calc Af Amer: >90 mL/min (ref >90)
Glucose, Bld: 102 mg/dL — ABNORMAL HIGH (ref 70–99)
POTASSIUM: 3.5 meq/L — AB (ref 3.7–5.3)
SODIUM: 139 meq/L (ref 137–147)
Total Protein: 5.7 g/dL — ABNORMAL LOW (ref 6.0–8.3)

## 2014-02-20 LAB — CBC
HCT: 38.3 % — ABNORMAL LOW (ref 39.0–52.0)
Hemoglobin: 13.4 g/dL (ref 13.0–17.0)
MCH: 33 pg (ref 26.0–34.0)
MCHC: 35 g/dL (ref 30.0–36.0)
MCV: 94.3 fL (ref 78.0–100.0)
PLATELETS: 202 10*3/uL (ref 150–400)
RBC: 4.06 MIL/uL — AB (ref 4.22–5.81)
RDW: 12.7 % (ref 11.5–15.5)
WBC: 12.4 10*3/uL — AB (ref 4.0–10.5)

## 2014-02-20 LAB — HEPARIN LEVEL (UNFRACTIONATED)
HEPARIN UNFRACTIONATED: 0.1 [IU]/mL — AB (ref 0.30–0.70)
Heparin Unfractionated: 0.4 IU/mL (ref 0.30–0.70)
Heparin Unfractionated: 0.35 IU/mL (ref 0.30–0.70)

## 2014-02-20 LAB — MRSA PCR SCREENING: MRSA by PCR: NEGATIVE

## 2014-02-20 MED ORDER — SODIUM CHLORIDE 0.45 % IV SOLN
INTRAVENOUS | Status: DC
  Administered 2014-02-20: 03:00:00 via INTRAVENOUS
  Administered 2014-02-20 – 2014-02-21 (×2): 999 mL via INTRAVENOUS

## 2014-02-20 MED ORDER — TRAZODONE HCL 100 MG PO TABS
100.0000 mg | ORAL_TABLET | Freq: Every day | ORAL | Status: DC
  Administered 2014-02-20 – 2014-02-21 (×3): 100 mg via ORAL
  Filled 2014-02-20 (×4): qty 1

## 2014-02-20 MED ORDER — SODIUM CHLORIDE 0.9 % IJ SOLN
3.0000 mL | Freq: Two times a day (BID) | INTRAMUSCULAR | Status: DC
  Administered 2014-02-20 – 2014-02-22 (×6): 3 mL via INTRAVENOUS

## 2014-02-20 MED ORDER — IOHEXOL 350 MG/ML SOLN
80.0000 mL | Freq: Once | INTRAVENOUS | Status: AC | PRN
  Administered 2014-02-20: 70 mL via INTRAVENOUS

## 2014-02-20 MED ORDER — HYDROMORPHONE HCL PF 1 MG/ML IJ SOLN
1.0000 mg | INTRAMUSCULAR | Status: DC | PRN
  Administered 2014-02-20 (×2): 1 mg via INTRAVENOUS
  Filled 2014-02-20 (×3): qty 1

## 2014-02-20 MED ORDER — GABAPENTIN 300 MG PO CAPS
900.0000 mg | ORAL_CAPSULE | Freq: Three times a day (TID) | ORAL | Status: DC
  Administered 2014-02-20 – 2014-02-22 (×7): 900 mg via ORAL
  Filled 2014-02-20 (×9): qty 3

## 2014-02-20 MED ORDER — HEPARIN BOLUS VIA INFUSION
3000.0000 [IU] | Freq: Once | INTRAVENOUS | Status: AC
  Administered 2014-02-20: 3000 [IU] via INTRAVENOUS
  Filled 2014-02-20: qty 3000

## 2014-02-20 MED ORDER — HYDROMORPHONE HCL PF 1 MG/ML IJ SOLN
1.0000 mg | INTRAMUSCULAR | Status: DC | PRN
  Administered 2014-02-20 – 2014-02-22 (×8): 1 mg via INTRAVENOUS
  Filled 2014-02-20 (×8): qty 1

## 2014-02-20 MED ORDER — NORTRIPTYLINE HCL 25 MG PO CAPS
50.0000 mg | ORAL_CAPSULE | Freq: Every day | ORAL | Status: DC
  Administered 2014-02-20 – 2014-02-21 (×3): 50 mg via ORAL
  Filled 2014-02-20 (×4): qty 2

## 2014-02-20 MED ORDER — LORATADINE 10 MG PO TABS
10.0000 mg | ORAL_TABLET | Freq: Every day | ORAL | Status: DC
  Administered 2014-02-20 – 2014-02-22 (×3): 10 mg via ORAL
  Filled 2014-02-20 (×3): qty 1

## 2014-02-20 MED ORDER — ONDANSETRON HCL 4 MG/2ML IJ SOLN: 4.0000 mg | Freq: Three times a day (TID) | INTRAMUSCULAR | Status: AC | PRN

## 2014-02-20 MED ORDER — ALBUTEROL SULFATE (2.5 MG/3ML) 0.083% IN NEBU: 3.0000 mL | INHALATION_SOLUTION | RESPIRATORY_TRACT | Status: DC | PRN

## 2014-02-20 MED ORDER — FLUTICASONE PROPIONATE 50 MCG/ACT NA SUSP
2.0000 | Freq: Every day | NASAL | Status: DC
  Administered 2014-02-20 – 2014-02-22 (×3): 2 via NASAL
  Filled 2014-02-20: qty 16

## 2014-02-20 MED ORDER — MORPHINE SULFATE 4 MG/ML IJ SOLN
4.0000 mg | Freq: Once | INTRAMUSCULAR | Status: AC
  Administered 2014-02-20: 4 mg via INTRAVENOUS
  Filled 2014-02-20: qty 1

## 2014-02-20 MED ORDER — SIMVASTATIN 20 MG PO TABS
20.0000 mg | ORAL_TABLET | Freq: Every evening | ORAL | Status: DC
  Administered 2014-02-20 – 2014-02-21 (×2): 20 mg via ORAL
  Filled 2014-02-20 (×3): qty 1

## 2014-02-20 MED ORDER — HYDROCODONE-ACETAMINOPHEN 5-325 MG PO TABS
2.0000 | ORAL_TABLET | ORAL | Status: DC | PRN
  Administered 2014-02-20 – 2014-02-22 (×5): 2 via ORAL
  Filled 2014-02-20 (×5): qty 2

## 2014-02-20 MED ORDER — TIOTROPIUM BROMIDE MONOHYDRATE 18 MCG IN CAPS
18.0000 ug | ORAL_CAPSULE | Freq: Every day | RESPIRATORY_TRACT | Status: DC
  Administered 2014-02-21 – 2014-02-22 (×2): 18 ug via RESPIRATORY_TRACT
  Filled 2014-02-20: qty 5

## 2014-02-20 MED ORDER — HEPARIN (PORCINE) IN NACL 100-0.45 UNIT/ML-% IJ SOLN
1900.0000 [IU]/h | INTRAMUSCULAR | Status: DC
  Administered 2014-02-20: 1900 [IU]/h via INTRAVENOUS
  Administered 2014-02-20: 1600 [IU]/h via INTRAVENOUS
  Administered 2014-02-21: 1900 [IU]/h via INTRAVENOUS
  Filled 2014-02-20 (×5): qty 250

## 2014-02-20 MED ORDER — HEPARIN BOLUS VIA INFUSION
5000.0000 [IU] | Freq: Once | INTRAVENOUS | Status: AC
  Administered 2014-02-20: 5000 [IU] via INTRAVENOUS
  Filled 2014-02-20: qty 5000

## 2014-02-20 MED ORDER — SODIUM CHLORIDE 0.9 % IV SOLN
INTRAVENOUS | Status: DC
  Administered 2014-02-20: 01:00:00 via INTRAVENOUS

## 2014-02-20 NOTE — Progress Notes (Signed)
Utilization Review Completed.  

## 2014-02-20 NOTE — Progress Notes (Signed)
ANTICOAGULATION CONSULT NOTE - Follow Up Consult  Pharmacy Consult for heparin Indication: pulmonary embolus  Allergies  Allergen Reactions  . Fentanyl     Per pt he had a near overdose on these due to "regulating body temp"  . Methadone Itching    Patient Measurements: Height: 5\' 10"  (177.8 cm) Weight: 250 lb 7.1 oz (113.6 kg) IBW/kg (Calculated) : 73 Heparin Dosing Weight: 100 kg  Vital Signs: Temp: 98 F (36.7 C) (04/16 0825) Temp src: Oral (04/16 0825) BP: 137/91 mmHg (04/16 0825) Pulse Rate: 96 (04/16 0825)  Labs:  Recent Labs  02/19/14 2223 02/20/14 0753  HGB 14.0 13.4  HCT 39.6 38.3*  PLT 201 202  HEPARINUNFRC  --  0.10*  CREATININE 1.03 0.88    Estimated Creatinine Clearance: 123.9 ml/min (by C-G formula based on Cr of 0.88).   Assessment: Patient is a 53 y.o M  On heparin for new PE.  First heparin level now back subtherapeutic at 0.10 (no issues with IV line per RN).  No bleeding documented.  Goal of Therapy:  Heparin level 0.3-0.7 units/ml Monitor platelets by anticoagulation protocol: Yes   Plan:  1) heparin 3000 units IV bolus, then increase drip to 1900 units/hr 2) check 6 hour heparin level  Zuma Hust P Braley Luckenbaugh 02/20/2014,9:17 AM

## 2014-02-20 NOTE — ED Notes (Signed)
Dr. Leonides Schanz talking to pt at bedside.

## 2014-02-20 NOTE — H&P (Signed)
FMTS Attending Admit Note Patient seen and examined by me, discussed with resident team and I agree with Dr Darnelle Going assess/plan. Briefly, 53yoM admitted with left-sided chest pain and dyspnea with acute onset 2pm 4/15, workup included elevated D-dimer and CTA chest revealing saddle embolism.  Patient has remained hemodynamically stable; CTA does not give evidence of R heart strain.  Patient reports continued pain; previously on chronic opioids with Opana 40mg  twice daily and Hydromorphone 10mg  four times daily in the past.  No recent surgeries; has had a recent fall resulting from knee weakness associated with lumbar disc impingement 10 days prior to admission.  No known malignancy.  Has not had colon cancer screening. He has a tobacco use history. No recent fevers/chills, no unexplained weight loss.  Has had decreased urinary stream that has been progressive.   Exam: alert, answers questions but in mild distress/pain during interview/exam.  HEENT Neck supple; moist mucus membranes.  COR Regular S1S2 PULM poor inspiratory effort. ABD Soft, nontender.  EXTS: no disparity in calf girth; no popliteal tenderness or cords noted.   A/P: Patient with acute extensive PE; no evidence of R heart strain on CTA and he remains hemodynamically stable in SDU.  Plan for unfractionated heparin as initial anticoagulant; evaluation of LEs with doppler to assess additional clot burden that might be indication for IVC filter placement.  2D ECHO to evaluate for R sided heart strain.  If sudden hemodynamic changes or R sided heart strain, to consider IR consult for consideration of thrombolysis. Dalbert Mayotte, MD

## 2014-02-20 NOTE — H&P (Signed)
[facility] Admission History and Physical Service Pager: [phone]  Patient name: [name] [name] Medical record number: [id number] Date of birth: 1960-12-14 Age: 53 y.o. Gender: male  Primary Care Provider: MERRELL, DAVID, MD Consultants: None Code Status: Full code  Chief Complaint: Shortness of breath  Assessment and Plan: [name] [name] is a 53 y.o. male presenting with saddle pulmonary embolism . PMH is significant for Hypertension, hyperlipidemia, COPD, chronic pain   # Acute pulmonary embolism: patient has risk factors including smoking and recent relative immobility due to lower back injury resulting in leg numbness. Saddle embolism found on CTA.  Admit to inpatient, stepdown, attending physician Dr. Lindell Noe  Continue full heparin anticoagulation  Will need to transition to oral anticoagulation. Will most likely switch to NOAC  Cardiac monitoring, will watch vitals very closely  O2 therapy, keep O2 >92%, routine pulse oximetry  Will monitor vitals closely  Consider echocardiogram to assess for right heart strain  # Hypertension: home medication not on file. Blood pressures currently controlled  Monitor blood pressure  # Mildly elevated LFTs and lipase: could be common bile dict pathology, however only mildly elevated. Left sided chest/upper abdomen pain in area of pancreas. Patient does not drink.  Repeat CMET in AM  # Hyperlipidemia  continue home statin  # Allergies: patient takes antihistamine at home. Currently very congested and reports nasal congestion is also a cause of his dyspnea  Loratadine 10mg   Fluticasone spray  Humidified air for [place]  # COPD  Continue ipratropium  Nicotine patches prn  # Chronic pain/Sciatica  Continue gabapentin 900 TID  Dilaudid 1mg  q4hrs  # History of Depression  Continue nortriptyline 50mg   FEN/GI: Heart healthy; 1/2 NS @100ml /hr Prophylaxis: Full dose heparin  Disposition: admit to  inpatient, stepdown unit  History of Present Illness: [name] [name] is a 53 y.o. male presenting with sudden shortness of breath that started around 2:00pm yesterday afternoon. She had associated sharp left sided chest pain. He has a dry cough. No fevers. He has no recent history of traveling and no long periods of inactivity. He is an everyday smoker and smokes 1PPD, which is down from 2PPD. He has a history of leg pain which is chronic. Patient was recently admitted to the hospital for two days found to have left foot drop. He has an AFO brace, and has not been moving his left extremity as much.  Review Of Systems: Per HPI with the following additions: None Otherwise 12 point review of systems was performed and was unremarkable.  Patient Active Problem List   Diagnosis Date Noted  . Saddle pulmonary embolus 02/20/2014  . Low back pain 02/16/2014  . Left foot drop 02/14/2014  . Fall 02/14/2014  . Acute renal failure 02/14/2014  . Hypokalemia 02/14/2014  . Leukocytosis 02/14/2014  . HTN (hypertension) 11/15/2011  . Hyperlipidemia 11/15/2011  . COPD (chronic obstructive pulmonary disease) 11/15/2011  . Nerve pain 11/15/2011  . Tobacco abuse 11/15/2011  . Chronic knee pain 11/15/2011   Past Medical History: Past Medical History  Diagnosis Date  . Constipation due to pain medication   . COPD (chronic obstructive pulmonary disease)   . Status post spinal surgery   . Hypertension   . Anxiety    Past Surgical History: Past Surgical History  Procedure Laterality Date  . Spinal fixation surgery     Social History: History  Substance Use Topics  . Smoking status: Current Every [place] Smoker -- 1.00 packs/[place]    Types: Cigarettes  .  Smokeless tobacco: Not on file     Comment: decreased to 1ppd  . Alcohol Use: Does not drink alcohol   Additional social history: None  Please also refer to relevant sections of EMR.  Family History: Family History  Problem Relation Age of Onset  .  Cancer Neg Hx   Mom - cancer  Allergies and Medications: Allergies  Allergen Reactions  . Fentanyl     Per pt he had a near overdose on these due to "regulating body temp"  . Methadone Itching   No current facility-administered medications on file prior to encounter.   No current outpatient prescriptions on file prior to encounter.    Objective: BP 126/81  Pulse 92  Temp(Src) 97.8 F (36.6 C) (Oral)  Resp 16  Ht 5' 10.87" (1.8 m)  Wt 249 lb 1.9 oz (113 kg)  BMI 34.88 kg/m2  SpO2 96%  Exam: General: Laying in bed, in no acute distress but appears in pain HEENT: PERRL, extra occular movements intact, dry oral mucous membranes. [place] in place Cardiovascular: Regular rate and rhythm, no murmurs Chest/Respiratory: Clear to auscultation bilaterally, no wheezing, no decreased breath sounds. Has tenderness around left costal margin on front and back. Tachypnea Abdomen: Soft, tenderness in upper right area, non-distended Extremities: No calf tenderness, non-erythematous, no palpable cords. Left foot brace.  Skin: two large areas of ecchymosis on lower abdomen Neuro: Alert and oriented x3. Left foot drop  Labs and Imaging: CBC   Recent Labs Lab 02/19/14 2223  WBC 13.7*  HGB 14.0  HCT 39.6  PLT 201     CMP     Component Value Date/Time   NA 140 02/19/2014 2223   K 3.6* 02/19/2014 2223   CL 102 02/19/2014 2223   CO2 24 02/19/2014 2223   GLUCOSE 92 02/19/2014 2223   BUN 19 02/19/2014 2223   CREATININE 1.03 02/19/2014 2223   CREATININE 0.98 03/28/2013 0834   CALCIUM 8.8 02/19/2014 2223   PROT 6.2 02/19/2014 2223   ALBUMIN 2.8* 02/19/2014 2223   AST 47* 02/19/2014 2223   ALT 58* 02/19/2014 2223   ALKPHOS 66 02/19/2014 2223   BILITOT 0.2* 02/19/2014 2223   GFRNONAA 82* 02/19/2014 2223   GFRAA >90 02/19/2014 2223   Lipase  Date Value Ref Range Status  02/19/2014 86* 11 - 59 U/L Final   D-Dimer, Quant  Date Value Ref Range Status  02/19/2014 7.63* 0.00 - 0.48 ug/mL-FEU Final     Dg Chest 2 View  02/19/2014   CLINICAL DATA:  Chest pain  EXAM: CHEST  2 VIEW  COMPARISON:  02/14/2014  FINDINGS: Heart size is normal. Mediastinal shadows are normal. The lungs are clear. No effusions. No bony abnormalities.  IMPRESSION: No active cardiopulmonary disease.   Electronically Signed   By: Nelson Chimes M.D.   On: 02/19/2014 23:06   Ct Angio Chest Pe W/cm &/or Wo Cm  02/20/2014   CLINICAL DATA:  Evaluate for pulmonary embolism  EXAM: CT ANGIOGRAPHY CHEST WITH CONTRAST  TECHNIQUE: Multidetector CT imaging of the chest was performed using the standard protocol during bolus administration of intravenous contrast. Multiplanar CT image reconstructions and MIPs were obtained to evaluate the vascular anatomy.  CONTRAST:  2mL OMNIPAQUE IOHEXOL 350 MG/ML SOLN  COMPARISON:  Prior radiograph from earlier the same [place]  FINDINGS: The visualized thyroid is within normal limits. No pathologically enlarged mediastinal, hilar, or axillary lymph nodes are identified.  Aorta is of normal caliber with normal appearance. Great vessels are within  normal limits. Incidental note made of a bovine arch.  Heart size within normal limits. RV to LV ratio measures 0.8, within normal limits. No pericardial effusion.  Main pulmonary artery is of normal caliber measuring 2.6 cm. Saddle filling defects seen involving the main pulmonary arteries bilaterally, compatible with acute pulmonary embolism. There is extension into the lobar and segmental branches bilaterally, most evident within the left lower lobe.  Lungs are clear without focal infiltrate or pulmonary edema. Linear opacity within the right lower lobe and peripheral right upper lobe most likely reflect atelectasis. No pleural effusion. No pulmonary nodule or mass lesion. Mild upper lobe predominant paraseptal emphysema noted.  Subcentimeter hypodensity within the left hepatic lobe is too small the characterize by CT, but statistically likely represents a cyst. The  remainder of the visualized upper abdomen is within normal limits.  No acute osseous abnormality. Multilevel degenerative changes noted within the visualized spine. No worrisome lytic or blastic osseous lesions. Cervical ACDF partially visualized.  IMPRESSION: 1. Acute saddle pulmonary embolism with extension into lobar and segmental pulmonary arteries bilaterally, with greatest involvement within the left lower lobe segmental branches. No CT evidence of right heart strain. 2. Right basilar atelectasis.  Critical Value/emergent results were called by telephone at the time of interpretation on 02/20/2014 at 12:47 AM to Dr. Pryor Curia , who verbally acknowledged these results.   Electronically Signed   By: Jeannine Boga M.D.   On: 02/20/2014 00:52    Cordelia Poche, MD 02/20/2014, 1:19 AM PGY-1, [name] Intern pager: [phone], text pages welcome  PGY-3 Addendum I agree with Dr. Lisbeth Ply note above. We will continue heparin anticoagulation with plan to transition to oral medication. Monitor for changes in VS or respiratory status. We will check for lower extremity clot burden with bilateral duplex and order echo to evaluate for right he Daved Mcfann M. Wakeelah Solan, M.D.art strain.  02/20/2014 7:21 AM

## 2014-02-20 NOTE — Progress Notes (Signed)
Echocardiogram 2D Echocardiogram has been performed.  Ines Bloomer 02/20/2014, 11:08 AM

## 2014-02-20 NOTE — Progress Notes (Signed)
ANTICOAGULATION CONSULT NOTE - Initial Consult  Pharmacy Consult for heparin Indication: pulmonary embolus  Allergies  Allergen Reactions  . Fentanyl     Per pt he had a near overdose on these due to "regulating body temp"  . Methadone Itching    Patient Measurements: Height: 5' 10.87" (180 cm) Weight: 249 lb 1.9 oz (113 kg) IBW/kg (Calculated) : 74.99 Heparin Dosing Weight: 100kg  Vital Signs: Temp: 97.8 F (36.6 C) (04/15 2152) Temp src: Oral (04/15 2152) BP: 126/81 mmHg (04/15 2330) Pulse Rate: 92 (04/16 0034)  Labs:  Recent Labs  02/19/14 2223  HGB 14.0  HCT 39.6  PLT 201  CREATININE 1.03    Estimated Creatinine Clearance: 107 ml/min (by C-G formula based on Cr of 1.03).   Medical History: Past Medical History  Diagnosis Date  . Constipation due to pain medication   . COPD (chronic obstructive pulmonary disease)   . Status post spinal surgery   . Hypertension   . Anxiety     Assessment: 53yo male was discharged Sunday after stay for fall d/t drop of left foot, insurance did not approve HHPT and was likely sedentary since d/c, now presents c/o severe pleuritic CP worse w/ breathing/coughing/movement, CT reveals saddle PE without right heart strain, to begin heparin.  Goal of Therapy:  Heparin level 0.3-0.7 units/ml Monitor platelets by anticoagulation protocol: Yes   Plan:  Will give heparin 5000 units IV bolus followed by 1600 units/hr and monitor CBC and heparin levels; f/u re: long-term anticoag.  Wynona Neat, PharmD, BCPS  02/20/2014,1:06 AM

## 2014-02-20 NOTE — Progress Notes (Signed)
Pharmacy Note-Anticoagulation  Pharmacy Consult :  53 y.o. male is currently on Heparin for a pulmonary embolism.   Latest Labs : Hematology :  Recent Labs  02/19/14 2223 02/20/14 0753 02/20/14 1856  HGB 14.0 13.4  --   HCT 39.6 38.3*  --   PLT 201 202  --   HEPARINUNFRC  --  0.10* 0.40  CREATININE 1.03 0.88  --     Lab Results  Component Value Date   HEPARINUNFRC 0.40 02/20/2014   HEPARINUNFRC 0.10* 02/20/2014        HGB 13.4 02/20/2014   HGB 14.0 02/19/2014   HGB 13.0 02/15/2014    Current Medication[s] Include:  Infusion[s]: Infusions:  . HEPARIN 1,900 Units/hr (02/20/14 1513)   Assessment :  Heparin level is within the therapeutic range, 0.4 units/ml.  No evidence of bleeding complications observed.  Goal :  Heparin goal is Heparin level 0.3-0.7 units/ml.  Plan : 1. Heparin will be continued at the same rate, 1900 units/hr.    2. The next Heparin Level will be scheduled with the AM labs. 3. Will continue with Daily Heparin levels, CBCs while on Heparin.  Monitor for bleeding complications. Follow Platelet counts.  Estelle June, Pharm.D. 02/20/2014  8:48 PM

## 2014-02-21 DIAGNOSIS — M7989 Other specified soft tissue disorders: Secondary | ICD-10-CM

## 2014-02-21 DIAGNOSIS — M79609 Pain in unspecified limb: Secondary | ICD-10-CM

## 2014-02-21 DIAGNOSIS — M216X9 Other acquired deformities of unspecified foot: Secondary | ICD-10-CM

## 2014-02-21 LAB — CBC
HEMATOCRIT: 37.8 % — AB (ref 39.0–52.0)
HEMOGLOBIN: 12.7 g/dL — AB (ref 13.0–17.0)
MCH: 32.2 pg (ref 26.0–34.0)
MCHC: 33.6 g/dL (ref 30.0–36.0)
MCV: 95.7 fL (ref 78.0–100.0)
Platelets: 219 10*3/uL (ref 150–400)
RBC: 3.95 MIL/uL — ABNORMAL LOW (ref 4.22–5.81)
RDW: 12.6 % (ref 11.5–15.5)
WBC: 10.9 10*3/uL — ABNORMAL HIGH (ref 4.0–10.5)

## 2014-02-21 LAB — BASIC METABOLIC PANEL
BUN: 13 mg/dL (ref 6–23)
CO2: 24 meq/L (ref 19–32)
Calcium: 8.1 mg/dL — ABNORMAL LOW (ref 8.4–10.5)
Chloride: 106 mEq/L (ref 96–112)
Creatinine, Ser: 0.84 mg/dL (ref 0.50–1.35)
GFR calc Af Amer: >90 mL/min (ref >90)
GFR calc non Af Amer: >90 mL/min (ref >90)
GLUCOSE: 94 mg/dL (ref 70–99)
Potassium: 4.1 mEq/L (ref 3.7–5.3)
SODIUM: 142 meq/L (ref 137–147)

## 2014-02-21 LAB — HEPARIN LEVEL (UNFRACTIONATED): HEPARIN UNFRACTIONATED: 0.32 [IU]/mL (ref 0.30–0.70)

## 2014-02-21 MED ORDER — LISINOPRIL 20 MG PO TABS
20.0000 mg | ORAL_TABLET | Freq: Every day | ORAL | Status: DC
  Administered 2014-02-21 – 2014-02-22 (×2): 20 mg via ORAL
  Filled 2014-02-21 (×2): qty 1

## 2014-02-21 MED ORDER — HEPARIN (PORCINE) IN NACL 100-0.45 UNIT/ML-% IJ SOLN
1900.0000 [IU]/h | INTRAMUSCULAR | Status: AC
  Filled 2014-02-21: qty 250

## 2014-02-21 MED ORDER — HYDROCHLOROTHIAZIDE 25 MG PO TABS
25.0000 mg | ORAL_TABLET | Freq: Every day | ORAL | Status: DC
  Administered 2014-02-21 – 2014-02-22 (×2): 25 mg via ORAL
  Filled 2014-02-21 (×2): qty 1

## 2014-02-21 MED ORDER — RIVAROXABAN 15 MG PO TABS: 15.0000 mg | ORAL_TABLET | Freq: Two times a day (BID) | ORAL | Status: DC

## 2014-02-21 MED ORDER — KETOROLAC TROMETHAMINE 10 MG PO TABS
10.0000 mg | ORAL_TABLET | Freq: Three times a day (TID) | ORAL | Status: DC
  Administered 2014-02-21 – 2014-02-22 (×3): 10 mg via ORAL
  Filled 2014-02-21 (×6): qty 1

## 2014-02-21 MED ORDER — RIVAROXABAN (XARELTO) EDUCATION KIT FOR DVT/PE PATIENTS
PACK | Freq: Once | Status: AC
  Administered 2014-02-21: 16:00:00
  Filled 2014-02-21: qty 1

## 2014-02-21 MED ORDER — RIVAROXABAN 15 MG PO TABS
15.0000 mg | ORAL_TABLET | Freq: Two times a day (BID) | ORAL | Status: DC
  Filled 2014-02-21 (×2): qty 1

## 2014-02-21 MED ORDER — RIVAROXABAN 15 MG PO TABS
15.0000 mg | ORAL_TABLET | Freq: Two times a day (BID) | ORAL | Status: DC
  Administered 2014-02-21 – 2014-02-22 (×2): 15 mg via ORAL
  Filled 2014-02-21 (×4): qty 1

## 2014-02-21 NOTE — Progress Notes (Signed)
Pt had meal tray eating dinner,at 1827 pt  transferred after eating. Transferred via wheelchair with belongings. Pt moved to 5W36.

## 2014-02-21 NOTE — Progress Notes (Signed)
Xarelto information given to pt and discussed with pt, pt able to explain information(teach back) to RN as PharmD had explained it to him. [facility]path booklet given to pt, pt documented pm dose given in his calendar care path booklet, understands and will continue to do so.

## 2014-02-21 NOTE — Discharge Instructions (Signed)
Information on my medicine - XARELTO (rivaroxaban)  This medication education was reviewed with me or my healthcare representative as part of my discharge preparation.  The pharmacist that spoke with me during my hospital stay was:  Lurdes Haltiwanger P Vietta [place], [place]? Xarelto was prescribed to treat blood clots that may have been found in the veins of your legs (deep vein thrombosis) or in your lungs (pulmonary embolism) and to reduce the risk of them occurring again.  What do you need to know about Xarelto? The starting dose is one 15 mg tablet taken TWICE daily with food for the FIRST 21 DAYS then on 03/14/14 the dose is changed to one 20 mg tablet taken ONCE A Agresta with your evening meal.  DO NOT stop taking Xarelto without talking to the health care provider who prescribed the medication.  Refill your prescription for 20 mg tablets before you run out.  After discharge, you should have regular check-up appointments with your healthcare provider that is prescribing your Xarelto.  In the future your dose may need to be changed if your kidney function changes by a significant amount.  What do you do if you miss a dose? If you are taking Xarelto TWICE DAILY and you miss a dose, take it as soon as you remember. You may take two 15 mg tablets (total 30 mg) at the same time then resume your regularly scheduled 15 mg twice daily the next [place].  If you are taking Xarelto ONCE DAILY and you miss a dose, take it as soon as you remember on the same [place] then continue your regularly scheduled once daily regimen the next [place]. Do not take two doses of Xarelto at the same time.   Important Safety Information Xarelto is a blood thinner medicine that can cause bleeding. You should call your healthcare provider right away if you experience any of the following:   Bleeding from an injury or your nose that does not stop.   Unusual colored urine (red or dark brown) or unusual colored stools  (red or black).   Unusual bruising for unknown reasons.   A serious fall or if you hit your head (even if there is no bleeding).  Some medicines may interact with Xarelto and might increase your risk of bleeding while on Xarelto. To help avoid this, consult your healthcare provider or pharmacist prior to using any new prescription or non-prescription medications, including herbals, vitamins, non-steroidal anti-inflammatory drugs (NSAIDs) and supplements.  This website has more information on Xarelto: [email or web address]-[email or web address].[email or web address]

## 2014-02-21 NOTE — Progress Notes (Signed)
NURSING PROGRESS NOTE  [name] [name] [id number] Transfer Data: 02/21/2014 7:55 PM Attending Provider: Willeen Niece, MD MGN:[id number], DAVID, MD Code Status: Full   [name] [name] is a 53 y.o. male patient transferred from 2c  -No acute distress noted.  -No complaints of shortness of breath.  -No complaints of chest pain.   Cardiac Monitoring: Box # 10 in place.   Blood pressure 155/88, pulse 87, temperature 98.1 F (36.7 C), temperature source Oral, resp. rate 18, height 5\' 11"  (1.803 m), weight 118.4 kg (261 lb 0.4 oz), SpO2 97.00%.   IV Fluids: R F/A and L hand dated the 02/19/14.  Allergies:  Fentanyl and Methadone  Past Medical History:   has a past medical history of Constipation due to pain medication; COPD (chronic obstructive pulmonary disease); Status post spinal surgery; Hypertension; and Anxiety.  Past Surgical History:   has past surgical history that includes Spinal Fixation Surgery.  Social History:   reports that he has been smoking Cigarettes.  He has been smoking about 1.00 pack per [place]. He does not have any smokeless tobacco history on file. He reports that he does not use illicit drugs.  Skin: Intact Patient/Family orientated to room. Information packet given to patient/family. Admission inpatient armband information verified with patient/family to include name and date of birth and placed on patient arm. Side rails up x 2, fall assessment and education completed with patient/family. Patient/family able to verbalize understanding of risk associated with falls and verbalized understanding to call for assistance before getting out of bed. Call light within reach. Patient/family able to voice and demonstrate understanding of unit orientation instructions.    Will continue to evaluate and treat per MD orders.

## 2014-02-21 NOTE — Progress Notes (Signed)
Family Medicine Teaching Service Daily Progress Note Intern Pager: [phone]  Patient name: Tiburcio Doebler Medical record number: [id number] Date of birth: 04-Apr-1961 Age: 53 y.o. Gender: male  Primary Care Provider: Linna Darner, MD Consultants: None Code Status: Full code  Pt Overview and Major Events to Date:  4/16: patient admitted; echo grossly normal  Assessment and Plan: Miki Golda is a 53 y.o. male presenting with saddle pulmonary embolism . PMH is significant for Hypertension, hyperlipidemia, COPD, chronic pain   # Acute pulmonary embolism: patient has risk factors including smoking and recent relative immobility due to lower back injury resulting in leg numbness. Saddle embolism found on CTA. Patient has been stable, mostly on room air but at times requiring oxygen for comfort.  Continue full heparin anticoagulation  Will need to transition to oral anticoagulation. Will most likely switch to NOAC  Cardiac monitoring, will continue to watch vitals very closely  O2 therapy, keep O2 >92%, routine pulse oximetry Follow-up bilateral LE venous duplex scan to assess clot burden; will also get ABI and arterial doppler Will assess need for home oxygen  # Hypertension: Blood pressures currently elevated Restart home lisinopril 20mg  Monitor blood pressure   # Foot drop: was discharged last week. Recommendations included EMG.  EMG today  Consider tramadol today since renal function is good  Bedside commode  # Mildly elevated LFTs and lipase: LFTs returned to wnl.  # Hyperlipidemia  continue home statin  # Allergies: patient takes antihistamine at home. Currently very congested and reports nasal congestion is also a cause of his dyspnea  Loratadine 10mg   Fluticasone spray  Humidified air for [place]  # COPD  Continue ipratropium  Nicotine patches prn  # Chronic pain/Sciatica  Continue gabapentin 900 TID  Dilaudid 1mg  q2hrs  # History of Depression  Continue nortriptyline  50mg   FEN/GI: Heart healthy Prophylaxis: Full dose heparin  Disposition: transfer out of stepdown today  Subjective:  Breathing better today. Still has some right sided chest pain with deep inspiration but improved. Patient is having increased burning pain in his left leg that radiates from buttock to foot with increased swelling.  Objective: Temp:  [97.9 F (36.6 C)-98.4 F (36.9 C)] 97.9 F (36.6 C) (04/17 0400) Pulse Rate:  [84-101] 84 (04/17 0400) Resp:  [14-24] 16 (04/17 0400) BP: (131-148)/(76-97) 139/83 mmHg (04/17 0400) SpO2:  [91 %-97 %] 92 % (04/17 0400) Weight:  [260 lb 5.8 oz (118.1 kg)] 260 lb 5.8 oz (118.1 kg) (04/17 0400)  Physical Exam: General: Sitting in bed eating his food, in no acute distress Cardiovascular: Regular rate and rhythm, no murmur Respiratory: Clear to auscultation bilaterally, no wheezing Abdomen: Soft, obese, non-tender Extremities: Left foot is swollen. No sensation on back and lateral side of leg. Cool extremities bilaterally but not cold. Could not feel DP or PT pulses. Sensation intact in right foot.  Laboratory:  Recent Labs Lab 02/19/14 2223 02/20/14 0753 02/21/14 0341  WBC 13.7* 12.4* 10.9*  HGB 14.0 13.4 12.7*  HCT 39.6 38.3* 37.8*  PLT 201 202 219    Recent Labs Lab 02/16/14 1009 02/19/14 2223 02/20/14 0753  NA 139 140 139  K 3.8 3.6* 3.5*  CL 101 102 101  CO2 20 24 22   BUN 51* 19 15  CREATININE 2.27* 1.03 0.88  CALCIUM 8.3* 8.8 8.1*  PROT  --  6.2 5.7*  BILITOT  --  0.2* 0.3  ALKPHOS  --  66 62  ALT  --  58* 48  AST  --  50* 34  GLUCOSE 134* 92 102*    Imaging/Diagnostic Tests:  Echocardiogram (4/17) Study Conclusions  Left ventricle: The cavity size was normal. There was moderate concentric hypertrophy. Systolic function was normal. The estimated ejection fraction was in the range of 55% to 60%. Regional wall motion abnormalities cannot be excluded. Doppler parameters are consistent with abnormal left  ventricular relaxation (grade 1 diastolic dysfunction).  Cordelia Poche, MD 02/21/2014, 6:44 AM PGY-1, [place] Intern pager: [phone], text pages welcome

## 2014-02-21 NOTE — Care Management Note (Signed)
    Page 1 of 1   02/21/2014     4:03:15 PM CARE MANAGEMENT NOTE 02/21/2014  Patient:  [name] [name],[name] [name]   Account Number:  [id number]  Date Initiated:  02/21/2014  Documentation initiated by:  Angelina Venard  Subjective/Objective Assessment:   dx saddle pulmonary embolus    PCP  Dr Linna Darner     DC Planning Services  CM consult      Per UR Regulation:  Reviewed for med. necessity/level of care/duration of stay  Comments:  02/21/14 [place] MSN BSN CCM Pt to d/c on Xarelto, has Medicaid and Xarelto is on the Medicaid preferred med list so pt will be able to purchase same for $3.00.  Pt given card for Xarelto 30-[place] free trial offer.

## 2014-02-21 NOTE — Progress Notes (Signed)
Patient ID: [name] [name], male   DOB: 1961-01-28, 53 y.o.   MRN: [id number] Request received to evaluate pt for possible IVC filter placement. 53yoM admitted with left-sided chest pain and dyspnea with acute onset 2pm 4/15, workup included elevated D-dimer and CTA chest revealing saddle embolism. Patient has remained hemodynamically stable; CTA does not give evidence of R heart strain. Patient reports continued pain; previously on chronic opioids with Opana 40mg  twice daily and Hydromorphone 10mg  four times daily in the past.  No recent surgeries; has had a recent fall resulting from knee weakness associated with lumbar disc impingement 10 days prior to admission. No known malignancy. Has not had colon cancer screening. He has a tobacco use history.  No recent fevers/chills, no unexplained weight loss. Has had decreased urinary stream that has been progressive. Bilateral LE venous dopplers revealed- Right: DVT noted in the peroneal and posterior tibial veins. No evidence of superficial thrombosis. No Baker's cyst. Left: DVT noted in the distal popliteal vein. No evidence of superficial thrombosis. No Baker's cyst. Bilateral lower extremity arterial duplex completed. Bilateral: Triphasic waveforms noted throughout. Additional PMH as below. Exam: pt awake/alert; currently with no evidence of resp distress and tolerating IV heparin well. Chest- sl dim BS rt base, left clear; heart- RRR; abd- soft,+BS,NT; ext- FROM, no sig edema.    Filed Vitals:   02/21/14 0800 02/21/14 0814 02/21/14 1136 02/21/14 1531  BP: 149/91  147/98 161/99  Pulse: 86  96 86  Temp:   98 F (36.7 C) 98 F (36.7 C)  TempSrc:   Oral Oral  Resp: 15  16 17   Height:      Weight:      SpO2: 91% 93% 97% 98%   Past Medical History  Diagnosis Date  . Constipation due to pain medication   . COPD (chronic obstructive pulmonary disease)   . Status post spinal surgery   . Hypertension   . Anxiety    Past Surgical History  Procedure  Laterality Date  . Spinal fixation surgery     Dg Chest 2 View  02/19/2014   CLINICAL DATA:  Chest pain  EXAM: CHEST  2 VIEW  COMPARISON:  02/14/2014  FINDINGS: Heart size is normal. Mediastinal shadows are normal. The lungs are clear. No effusions. No bony abnormalities.  IMPRESSION: No active cardiopulmonary disease.   Electronically Signed   By: Nelson Chimes M.D.   On: 02/19/2014 23:06   Ct Head Wo Contrast  02/14/2014   CLINICAL DATA:  Status post fall  EXAM: CT HEAD WITHOUT CONTRAST  CT CERVICAL SPINE WITHOUT CONTRAST  TECHNIQUE: Multidetector CT imaging of the head and cervical spine was performed following the standard protocol without intravenous contrast. Multiplanar CT image reconstructions of the cervical spine were also generated.  COMPARISON:  None.  In the MR C SPINE WO/W CM dated 04/26/2012; CT C SPINE W/O CM dated 10/11/2010; CT ANGIO HEAD W/CM &/OR WO/CM dated 12/02/2009  FINDINGS: CT HEAD FINDINGS  The ventricles are normal in size and position. There is no intracranial hemorrhage or intracranial mass effect. There is no evidence of an evolving ischemic infarction. The cerebellum and brainstem are normal in density. At bone window settings the observed portions of the paranasal sinuses and mastoid air cells are clear. There is no evidence of an acute skull fracture nor cephalohematoma.  CT CERVICAL SPINE FINDINGS  The patient has undergone previous anterior spinal fusion and C5-6 and C6-7 with intradiscal device placement. The vertebral bodies are preserved in  height. The prevertebral soft tissue spaces appear normal. The metallic hardware appears intact. There is no evidence of a perched facet nor of a spinous process fracture. The odontoid is intact and the lateral masses of C1 align normally with those of C2. The bony ring at each cervical level is intact. The observed portions of the first and second ribs appear normal. The pulmonary apices are clear. The soft tissues of the neck appear  normal where visualized.  IMPRESSION: 1. Normal noncontrast CT scan of the brain. 2. There are postfusion changes at C5-6 and C6-7. There is mild degenerative disc bulging at C3-4. There is no evidence of an acute cervical spine fracture nor dislocation.   Electronically Signed   By: David  [place]   On: 02/14/2014 19:12   Ct Angio Chest Pe W/cm &/or Wo Cm  02/20/2014   CLINICAL DATA:  Evaluate for pulmonary embolism  EXAM: CT ANGIOGRAPHY CHEST WITH CONTRAST  TECHNIQUE: Multidetector CT imaging of the chest was performed using the standard protocol during bolus administration of intravenous contrast. Multiplanar CT image reconstructions and MIPs were obtained to evaluate the vascular anatomy.  CONTRAST:  35mL OMNIPAQUE IOHEXOL 350 MG/ML SOLN  COMPARISON:  Prior radiograph from earlier the same [place]  FINDINGS: The visualized thyroid is within normal limits. No pathologically enlarged mediastinal, hilar, or axillary lymph nodes are identified.  Aorta is of normal caliber with normal appearance. Great vessels are within normal limits. Incidental note made of a bovine arch.  Heart size within normal limits. RV to LV ratio measures 0.8, within normal limits. No pericardial effusion.  Main pulmonary artery is of normal caliber measuring 2.6 cm. Saddle filling defects seen involving the main pulmonary arteries bilaterally, compatible with acute pulmonary embolism. There is extension into the lobar and segmental branches bilaterally, most evident within the left lower lobe.  Lungs are clear without focal infiltrate or pulmonary edema. Linear opacity within the right lower lobe and peripheral right upper lobe most likely reflect atelectasis. No pleural effusion. No pulmonary nodule or mass lesion. Mild upper lobe predominant paraseptal emphysema noted.  Subcentimeter hypodensity within the left hepatic lobe is too small the characterize by CT, but statistically likely represents a cyst. The remainder of the visualized upper  abdomen is within normal limits.  No acute osseous abnormality. Multilevel degenerative changes noted within the visualized spine. No worrisome lytic or blastic osseous lesions. Cervical ACDF partially visualized.  IMPRESSION: 1. Acute saddle pulmonary embolism with extension into lobar and segmental pulmonary arteries bilaterally, with greatest involvement within the left lower lobe segmental branches. No CT evidence of right heart strain. 2. Right basilar atelectasis.  Critical Value/emergent results were called by telephone at the time of interpretation on 02/20/2014 at 12:47 AM to Dr. Pryor Curia , who verbally acknowledged these results.   Electronically Signed   By: Jeannine Boga M.D.   On: 02/20/2014 00:52   Ct Cervical Spine Wo Contrast  02/14/2014   CLINICAL DATA:  Status post fall  EXAM: CT HEAD WITHOUT CONTRAST  CT CERVICAL SPINE WITHOUT CONTRAST  TECHNIQUE: Multidetector CT imaging of the head and cervical spine was performed following the standard protocol without intravenous contrast. Multiplanar CT image reconstructions of the cervical spine were also generated.  COMPARISON:  None.  In the MR C SPINE WO/W CM dated 04/26/2012; CT C SPINE W/O CM dated 10/11/2010; CT ANGIO HEAD W/CM &/OR WO/CM dated 12/02/2009  FINDINGS: CT HEAD FINDINGS  The ventricles are normal in size and position. There  is no intracranial hemorrhage or intracranial mass effect. There is no evidence of an evolving ischemic infarction. The cerebellum and brainstem are normal in density. At bone window settings the observed portions of the paranasal sinuses and mastoid air cells are clear. There is no evidence of an acute skull fracture nor cephalohematoma.  CT CERVICAL SPINE FINDINGS  The patient has undergone previous anterior spinal fusion and C5-6 and C6-7 with intradiscal device placement. The vertebral bodies are preserved in height. The prevertebral soft tissue spaces appear normal. The metallic hardware appears intact.  There is no evidence of a perched facet nor of a spinous process fracture. The odontoid is intact and the lateral masses of C1 align normally with those of C2. The bony ring at each cervical level is intact. The observed portions of the first and second ribs appear normal. The pulmonary apices are clear. The soft tissues of the neck appear normal where visualized.  IMPRESSION: 1. Normal noncontrast CT scan of the brain. 2. There are postfusion changes at C5-6 and C6-7. There is mild degenerative disc bulging at C3-4. There is no evidence of an acute cervical spine fracture nor dislocation.   Electronically Signed   By: David  [place]   On: 02/14/2014 19:12   Mr Lumbar Spine Wo Contrast  02/14/2014   CLINICAL DATA:  Low back pain.  Left-sided weakness and numbness.  EXAM: MRI LUMBAR SPINE WITHOUT CONTRAST  TECHNIQUE: Multiplanar, multisequence MR imaging was performed. No intravenous contrast was administered.  COMPARISON:  04/26/2012 MR.  FINDINGS: Last fully open disk space is labeled L5-S1. Present examination incorporates from T11-12 disc space through the S3 level.  Conus L1 level.  Visualized paravertebral structures unremarkable.  T11-12: Negative.  T12-L1: Shallow left posterior lateral protrusion. Slight indentation left lateral aspect of the thecal sac. Minimal crowding of adjacent nerve roots.  L1-2:  Negative.  L2-3:  Negative.  L3-4:  Negative.  L4-5: Mild disc degeneration. Bulge which centrally touches but does not cause significant compression of the thecal sac. Left foraminal/lateral disc osteophyte with slight encroachment upon but not significant compression of the exiting left L4 nerve root.  L5-S1: Disc degeneration with disc space narrowing and broad-based disc osteophyte complex. Centrally no compression of the S1 nerve roots or thecal sac. Laterally, extension greater on the right with mild encroachment upon the exiting right L5 nerve root.  IMPRESSION: No significant change from the prior  examination.  Summary of pertinent findings includes:  T12-L1 shallow left posterior lateral protrusion. Slight indentation left lateral aspect of the thecal sac. Minimal crowding of adjacent nerve roots.  L4-5 bulge which centrally touches but does not cause significant compression of the thecal sac. Left foraminal/lateral disc osteophyte with slight encroachment upon but not significant compression of the exiting left L4 nerve root.  L5-S1 broad-based disc osteophyte complex. Centrally no compression of the S1 nerve roots or thecal sac. Laterally, extension greater on the right with mild encroachment upon the exiting right L5 nerve root.   Electronically Signed   By: Chauncey Cruel M.D.   On: 02/14/2014 21:37   Dg Chest Port 1 View  02/14/2014   CLINICAL DATA:  Weakness.  EXAM: PORTABLE CHEST - 1 VIEW  COMPARISON:  DG CHEST 1V PORT dated 06/24/2013  FINDINGS: Cardiomediastinal silhouette is unremarkable for this low inspiratory portable examination with crowded vasculature markings. Small linear density in right lung base. The lungs are otherwise clear without pleural effusions or focal consolidations. Trachea projects midline and there is no  pneumothorax. Included soft tissue planes and osseous structures are non-suspicious. ACDF. Multiple EKG lines overlie the patient and may obscure subtle underlying pathology. Interval extubation and removal of nasogastric tube.  IMPRESSION: Minimal right lung base atelectasis versus scarring.   Electronically Signed   By: Elon Alas   On: 02/14/2014 20:04  Results for orders placed during the hospital encounter of 02/19/14  MRSA PCR SCREENING      Result Value Ref Range   MRSA by PCR NEGATIVE  NEGATIVE  CBC WITH DIFFERENTIAL      Result Value Ref Range   WBC 13.7 (*) 4.0 - 10.5 K/uL   RBC 4.21 (*) 4.22 - 5.81 MIL/uL   Hemoglobin 14.0  13.0 - 17.0 g/dL   HCT 39.6  39.0 - 52.0 %   MCV 94.1  78.0 - 100.0 fL   MCH 33.3  26.0 - 34.0 pg   MCHC 35.4  30.0 - 36.0  g/dL   RDW 12.5  11.5 - 15.5 %   Platelets 201  150 - 400 K/uL   Neutrophils Relative % 52  43 - 77 %   Neutro Abs 7.2  1.7 - 7.7 K/uL   Lymphocytes Relative 38  12 - 46 %   Lymphs Abs 5.2 (*) 0.7 - 4.0 K/uL   Monocytes Relative 7  3 - 12 %   Monocytes Absolute 0.9  0.1 - 1.0 K/uL   Eosinophils Relative 3  0 - 5 %   Eosinophils Absolute 0.4  0.0 - 0.7 K/uL   Basophils Relative 0  0 - 1 %   Basophils Absolute 0.0  0.0 - 0.1 K/uL  COMPREHENSIVE METABOLIC PANEL      Result Value Ref Range   Sodium 140  137 - 147 mEq/L   Potassium 3.6 (*) 3.7 - 5.3 mEq/L   Chloride 102  96 - 112 mEq/L   CO2 24  19 - 32 mEq/L   Glucose, Bld 92  70 - 99 mg/dL   BUN 19  6 - 23 mg/dL   Creatinine, Ser 1.03  0.50 - 1.35 mg/dL   Calcium 8.8  8.4 - 10.5 mg/dL   Total Protein 6.2  6.0 - 8.3 g/dL   Albumin 2.8 (*) 3.5 - 5.2 g/dL   AST 47 (*) 0 - 37 U/L   ALT 58 (*) 0 - 53 U/L   Alkaline Phosphatase 66  39 - 117 U/L   Total Bilirubin 0.2 (*) 0.3 - 1.2 mg/dL   GFR calc non Af Amer 82 (*) >90 mL/min   GFR calc Af Amer >90  >90 mL/min  D-DIMER, QUANTITATIVE      Result Value Ref Range   D-Dimer, Quant 7.63 (*) 0.00 - 0.48 ug/mL-FEU  LIPASE, BLOOD      Result Value Ref Range   Lipase 86 (*) 11 - 59 U/L  HEPARIN LEVEL (UNFRACTIONATED)      Result Value Ref Range   Heparin Unfractionated 0.10 (*) 0.30 - 0.70 IU/mL  CBC      Result Value Ref Range   WBC 12.4 (*) 4.0 - 10.5 K/uL   RBC 4.06 (*) 4.22 - 5.81 MIL/uL   Hemoglobin 13.4  13.0 - 17.0 g/dL   HCT 38.3 (*) 39.0 - 52.0 %   MCV 94.3  78.0 - 100.0 fL   MCH 33.0  26.0 - 34.0 pg   MCHC 35.0  30.0 - 36.0 g/dL   RDW 12.7  11.5 - 15.5 %   Platelets 202  150 - 400 K/uL  COMPREHENSIVE METABOLIC PANEL      Result Value Ref Range   Sodium 139  137 - 147 mEq/L   Potassium 3.5 (*) 3.7 - 5.3 mEq/L   Chloride 101  96 - 112 mEq/L   CO2 22  19 - 32 mEq/L   Glucose, Bld 102 (*) 70 - 99 mg/dL   BUN 15  6 - 23 mg/dL   Creatinine, Ser 0.88  0.50 - 1.35 mg/dL    Calcium 8.1 (*) 8.4 - 10.5 mg/dL   Total Protein 5.7 (*) 6.0 - 8.3 g/dL   Albumin 2.7 (*) 3.5 - 5.2 g/dL   AST 34  0 - 37 U/L   ALT 48  0 - 53 U/L   Alkaline Phosphatase 62  39 - 117 U/L   Total Bilirubin 0.3  0.3 - 1.2 mg/dL   GFR calc non Af Amer >90  >90 mL/min   GFR calc Af Amer >90  >90 mL/min  HEPARIN LEVEL (UNFRACTIONATED)      Result Value Ref Range   Heparin Unfractionated 0.35  0.30 - 0.70 IU/mL  HEPARIN LEVEL (UNFRACTIONATED)      Result Value Ref Range   Heparin Unfractionated 0.32  0.30 - 0.70 IU/mL  CBC      Result Value Ref Range   WBC 10.9 (*) 4.0 - 10.5 K/uL   RBC 3.95 (*) 4.22 - 5.81 MIL/uL   Hemoglobin 12.7 (*) 13.0 - 17.0 g/dL   HCT 37.8 (*) 39.0 - 52.0 %   MCV 95.7  78.0 - 100.0 fL   MCH 32.2  26.0 - 34.0 pg   MCHC 33.6  30.0 - 36.0 g/dL   RDW 12.6  11.5 - 15.5 %   Platelets 219  150 - 400 K/uL  HEPARIN LEVEL (UNFRACTIONATED)      Result Value Ref Range   Heparin Unfractionated 0.40  0.30 - 0.70 IU/mL  BASIC METABOLIC PANEL      Result Value Ref Range   Sodium 142  137 - 147 mEq/L   Potassium 4.1  3.7 - 5.3 mEq/L   Chloride 106  96 - 112 mEq/L   CO2 24  19 - 32 mEq/L   Glucose, Bld 94  70 - 99 mg/dL   BUN 13  6 - 23 mg/dL   Creatinine, Ser 0.84  0.50 - 1.35 mg/dL   Calcium 8.1 (*) 8.4 - 10.5 mg/dL   GFR calc non Af Amer >90  >90 mL/min   GFR calc Af Amer >90  >90 mL/min  I-STAT TROPOININ, ED      Result Value Ref Range   Troponin i, poc 0.00  0.00 - 0.08 ng/mL   Comment 3            A/P: Pt with hx recently diagnosed PE, bilateral LE DVT, no evidence of rt heart strain. Imaging studies and doppler results were reviewed by Dr. Anselm Pancoast (IR attending). He feels due to pt's low clot burden in LE's and current stable respiratory status, IVC filter placement is not indicated at this time. Would cont with current IV heparin and transition to [place] per pharmacy recommendations. If clinical status worsens we will be happy to reevaluate. Above d/w pt and Dr.  Lindell Noe.

## 2014-02-21 NOTE — Progress Notes (Addendum)
Bilateral lower extremity venous duplex completed.  Right:  DVT noted in the peroneal and posterior tibial veins.  No evidence of superficial thrombosis.  No Baker's cyst.  Left: DVT noted in the distal popliteal vein.  No evidence of superficial thrombosis.  No Baker's cyst.  Bilateral lower extremity arterial duplex completed.  Bilateral:  Triphasic waveforms noted throughout.

## 2014-02-21 NOTE — Progress Notes (Signed)
FMTS Attending Daily Note:  Jeff Darris Carachure MD  [phone] pager  Family Practice pager:  [phone] I have seen and examined this patient and have reviewed their chart. I have discussed this patient with the resident. I agree with the resident's findings, assessment and care plan.  Kiara Keep H Donald Memoli, MD 02/21/2014 4:10 PM    

## 2014-02-21 NOTE — Progress Notes (Signed)
Report given to RN Katie on unit [facility].

## 2014-02-21 NOTE — Progress Notes (Signed)
Attempted to call report to unit [facility] RN, pt to transfer to [id number], telemetry bed. RN to return call.

## 2014-02-21 NOTE — Progress Notes (Signed)
I personally evaluated the patient and agree with PA report.

## 2014-02-22 LAB — BASIC METABOLIC PANEL
BUN: 18 mg/dL (ref 6–23)
CHLORIDE: 106 meq/L (ref 96–112)
CO2: 26 mEq/L (ref 19–32)
Calcium: 8.5 mg/dL (ref 8.4–10.5)
Creatinine, Ser: 1.08 mg/dL (ref 0.50–1.35)
GFR, EST AFRICAN AMERICAN: 89 mL/min — AB (ref >90)
GFR, EST NON AFRICAN AMERICAN: 77 mL/min — AB (ref >90)
Glucose, Bld: 89 mg/dL (ref 70–99)
POTASSIUM: 3.7 meq/L (ref 3.7–5.3)
Sodium: 143 mEq/L (ref 137–147)

## 2014-02-22 LAB — CBC
HCT: 37.2 % — ABNORMAL LOW (ref 39.0–52.0)
HEMOGLOBIN: 12.7 g/dL — AB (ref 13.0–17.0)
MCH: 32.4 pg (ref 26.0–34.0)
MCHC: 34.1 g/dL (ref 30.0–36.0)
MCV: 94.9 fL (ref 78.0–100.0)
Platelets: 248 10*3/uL (ref 150–400)
RBC: 3.92 MIL/uL — ABNORMAL LOW (ref 4.22–5.81)
RDW: 12.6 % (ref 11.5–15.5)
WBC: 10.3 10*3/uL (ref 4.0–10.5)

## 2014-02-22 MED ORDER — RIVAROXABAN 20 MG PO TABS: 20.0000 mg | ORAL_TABLET | Freq: Every day | ORAL | Status: DC

## 2014-02-22 MED ORDER — NORTRIPTYLINE HCL 50 MG PO CAPS: 50.0000 mg | ORAL_CAPSULE | Freq: Every day | ORAL | Status: DC

## 2014-02-22 MED ORDER — LORATADINE 10 MG PO TABS: 10.0000 mg | ORAL_TABLET | Freq: Every day | ORAL | Status: DC

## 2014-02-22 MED ORDER — OXYCODONE-ACETAMINOPHEN 5-325 MG PO TABS: 1.0000 | ORAL_TABLET | Freq: Three times a day (TID) | ORAL | Status: DC | PRN

## 2014-02-22 MED ORDER — HYDROCHLOROTHIAZIDE 25 MG PO TABS: 25.0000 mg | ORAL_TABLET | Freq: Every day | ORAL | Status: DC

## 2014-02-22 MED ORDER — LISINOPRIL 20 MG PO TABS: 20.0000 mg | ORAL_TABLET | Freq: Every day | ORAL | Status: DC

## 2014-02-22 MED ORDER — RIVAROXABAN 15 MG PO TABS: 15.0000 mg | ORAL_TABLET | Freq: Two times a day (BID) | ORAL | Status: DC

## 2014-02-22 NOTE — Progress Notes (Signed)
CARE MANAGEMENT NOTE 02/22/2014  Patient:  [name] [name],[name] [name]   Account Number:  [id number]  Date Initiated:  02/21/2014  Documentation initiated by:  MAYO,HENRIETTA  Subjective/Objective Assessment:   dx saddle pulmonary embolus    PCP  Dr Linna Darner     Action/Plan:   discharge planing   Anticipated DC Date:  02/22/2014   Anticipated DC Plan:        [place]  CM consult      Choice offered to / List presented to:             Status of service:  Completed, signed off Medicare Important Message given?   (If response is "NO", the following Medicare IM given date fields will be blank) Date Medicare IM given:   Date Additional Medicare IM given:    Discharge Disposition:  HOME/SELF CARE  Per UR Regulation:  Reviewed for med. necessity/level of care/duration of stay  If discussed at [place] of Stay Meetings, dates discussed:    Comments:  02/22/14 15:15 RN called for a Xarelto card but pt already has been given card and no other CM needs communicated. RN made aware.  Mariane Masters, BSN, [place] [phone].  02/21/14 [place] RN MSN BSN CCM Pt to d/c on Xarelto, has Medicaid and Xarelto is on the Medicaid preferred med list so pt will be able to purchase same for $3.00.  Pt given card for Xarelto 30-[place] free trial offer.

## 2014-02-22 NOTE — Progress Notes (Signed)
Family Medicine Teaching Service Daily Progress Note Intern Pager: [phone]  Patient name: [name] [name] Medical record number: [id number] Date of birth: [birth year] Age: 53 y.o. Gender: male  Primary Care Provider: Linna Darner, MD Consultants: None Code Status: Full code  Pt Overview and Major Events to Date:  4/16: patient admitted; echo grossly normal  Assessment and Plan: [name] [name] is a 53 y.o. male presenting with saddle pulmonary embolism . PMH is significant for Hypertension, hyperlipidemia, COPD, chronic pain   # Acute pulmonary embolism and DVT: patient has risk factors including smoking and recent relative immobility due to lower back injury resulting in leg numbness. Saddle embolism found on CTA. Patient has been stable, no O2 needed Right: DVT noted in the peroneal and posterior tibial veins. No evidence of superficial thrombosis. No Baker's cyst. Left: DVT noted in the distal popliteal vein. No evidence of superficial thrombosis.  -Rivaroxaban started yesterday. -IR evaluated and feelt due to pt's low clot burden in LE's and current stable respiratory status, IVC filter placement was not indicated.  # Hypertension: Blood pressures currently elevat- - Restart home lisinopril 20mg  ad HCTZ added.  # Foot drop: was discharged last week. Recommendations included EMG. That can be done as outpatient.  - Bedside commode  # Mildly elevated LFTs and lipase: LFTs returned to wnl.  # Hyperlipidemia  continue home statin  # Allergies: patient takes antihistamine at home. Currently very congested and reports nasal congestion is also a cause of his dyspnea - Loratadine 10mg   - Fluticasone spray   # COPD  -Continue home regimen.  # Chronic pain/Sciatica  -Continue gabapentin 900 TID  -percocet PO  # History of Depression  -Continue nortriptyline 50mg   FEN/GI: Heart healthy Prophylaxis: Full dose heparin  Disposition: discharge home today  Subjective:  Still complains  about his L leg sciatic pain. No difficulty breathing. At room air.  Objective: Temp:  [98 F (36.7 C)-98.2 F (36.8 C)] 98 F (36.7 C) (04/18 0409) Pulse Rate:  [81-87] 81 (04/18 0409) Resp:  [17-18] 18 (04/18 0409) BP: (137-161)/(82-99) 137/82 mmHg (04/18 0409) SpO2:  [93 %-98 %] 95 % (04/18 0803) Weight:  [261 lb 0.4 oz (118.4 kg)] 261 lb 0.4 oz (118.4 kg) (04/17 1837)  Physical Exam: General: Sitting in bed NAD Cardiovascular: Regular rate and rhythm, no murmur Respiratory: Clear to auscultation bilaterally, no wheezing Abdomen: Soft, obese, non-tender Extremities: strength 5/5 in all 4 extremities except left foot. Decrease sensation on L foot up to ankle. Normal and bilateral reflexes.  Laboratory:  Recent Labs Lab 02/20/14 0753 02/21/14 0341 02/22/14 0533  WBC 12.4* 10.9* 10.3  HGB 13.4 12.7* 12.7*  HCT 38.3* 37.8* 37.2*  PLT 202 219 248    Recent Labs Lab 02/19/14 2223 02/20/14 0753 02/21/14 0853 02/22/14 0533  NA 140 139 142 143  K 3.6* 3.5* 4.1 3.7  CL 102 101 106 106  CO2 24 22 24 26   BUN 19 15 13 18   CREATININE 1.03 0.88 0.84 1.08  CALCIUM 8.8 8.1* 8.1* 8.5  PROT 6.2 5.7*  --   --   BILITOT 0.2* 0.3  --   --   ALKPHOS 66 62  --   --   ALT 58* 48  --   --   AST 47* 34  --   --   GLUCOSE 92 102* 94 89    Imaging/Diagnostic Tests:  Echocardiogram (4/17) Study Conclusions  Left ventricle: The cavity size was normal. There was moderate concentric hypertrophy. Systolic function  was normal. The estimated ejection fraction was in the range of 55% to 60%. Regional wall motion abnormalities cannot be excluded. Doppler parameters are consistent with abnormal left ventricular relaxation (grade 1 diastolic dysfunction).  Dayarmys Piloto de Gwendalyn Ege, MD 02/22/2014, 1:34 PM PGY-3, [place] Intern pager: [phone], text pages welcome

## 2014-02-22 NOTE — Discharge Summary (Signed)
[facility] Discharge Summary  Patient name: [name] [name] Medical record number: [id number] Date of birth: 10-Nov-1960 Age: 53 y.o. Gender: male Date of Admission: 02/19/2014  Date of Discharge: 02/22/2014 Admitting Physician: Willeen Niece, MD  Primary Care Provider: Linna Darner, MD Consultants: Interventional Radiology  Indication for Hospitalization: Saddle pulmonary embolism  Discharge Diagnoses/Problem List:  Acute saddle pulmonary embolism DVT Hypertension Foot drop Hyperlipidemia Allergies COPD History of depression  Disposition: Discharge home  Discharge Condition: Stable  Discharge Exam:  Performed by Dayarmys Piloto de Gwendalyn Ege, MD  General: Sitting in bed NAD  Cardiovascular: Regular rate and rhythm, no murmur  Respiratory: Clear to auscultation bilaterally, no wheezing  Abdomen: Soft, obese, non-tender  Extremities: strength 5/5 in all 4 extremities except left foot. Decrease sensation on L foot up to ankle. Normal and bilateral reflexes.  Brief Hospital Course:   HPI [name] [name] is a 53 y.o. male presenting with sudden shortness of breath that started around 2:00pm yesterday afternoon. She had associated sharp left sided chest pain. He has a dry cough. No fevers. He has no recent history of traveling and no long periods of inactivity. He is an everyday smoker and smokes 1PPD, which is down from 2PPD. He has a history of leg pain which is chronic. Patient was recently admitted to the hospital for two days found to have left foot drop. He has an AFO brace, and has not been moving his left extremity as much.  Acute saddle pulmonary embolism Patient found to have a saddle PE on CTA. Started on heparin drip. EKG and echo showed no right heart strain. Had left chest pain worse on inspiration and required O2. Pain managed with dilaudid. Patient's pain improved and patient was weaned off of oxygen. Venous duplex showed bilateral DVTs in legs.  Consulted IR for recommendations on possible IVC filter. IR felt patient did not qualify. Patient then transitioned to Xarelto and was discharged.  DVT Patient found to have bilateral DVT in legs. Managed as in above problem  Hypertension Continued home lisinopril and HCTZ  Foot drop Follow-up outpatient  Hyperlipidemia Continued home statin  Allergies Loratadine and fluticasone given while inpatient. O2 with humidified air provided  COPD Continued home Spiriva  History of depression Continued home nortriptyline   Issues for Follow Up:  1. EMG studies for left drop foot 2. Follow-up EKG shows signs of infarction.   Significant Procedures: Echocardiogram, venous duplex  Significant Labs and Imaging:   Recent Labs Lab 02/20/14 0753 02/21/14 0341 02/22/14 0533  WBC 12.4* 10.9* 10.3  HGB 13.4 12.7* 12.7*  HCT 38.3* 37.8* 37.2*  PLT 202 219 248    Recent Labs Lab 02/19/14 2223 02/20/14 0753 02/21/14 0853 02/22/14 0533  NA 140 139 142 143  K 3.6* 3.5* 4.1 3.7  CL 102 101 106 106  CO2 24 22 24 26   GLUCOSE 92 102* 94 89  BUN 19 15 13 18   CREATININE 1.03 0.88 0.84 1.08  CALCIUM 8.8 8.1* 8.1* 8.5  ALKPHOS 66 62  --   --   AST 47* 34  --   --   ALT 58* 48  --   --   ALBUMIN 2.8* 2.7*  --   --    D-Dimer, Quant  Date Value Ref Range Status  02/19/2014 7.63* 0.00 - 0.48 ug/mL-FEU Final   EKG (4/15) Sinus rhythm Probable left atrial enlargement Low voltage, precordial leads Abnormal R-wave progression, early transition  EKG (4/16) Normal sinus rhythm  Inferior infarct , age undetermined - new Abnormal ECG  Echocardiogram (4/16) Study Conclusions  Left ventricle: The cavity size was normal. There was moderate concentric hypertrophy. Systolic function was normal. The estimated ejection fraction was in the range of 55% to 60%. Regional wall motion abnormalities cannot be excluded. Doppler parameters are consistent with abnormal left ventricular  relaxation (grade 1 diastolic dysfunction).   Dg Chest 2 View  02/19/2014   CLINICAL DATA:  Chest pain  EXAM: CHEST  2 VIEW  COMPARISON:  02/14/2014  FINDINGS: Heart size is normal. Mediastinal shadows are normal. The lungs are clear. No effusions. No bony abnormalities.  IMPRESSION: No active cardiopulmonary disease.   Electronically Signed   By: Nelson Chimes M.D.   On: 02/19/2014 23:06   Ct Angio Chest Pe W/cm &/or Wo Cm  02/20/2014   CLINICAL DATA:  Evaluate for pulmonary embolism  EXAM: CT ANGIOGRAPHY CHEST WITH CONTRAST  TECHNIQUE: Multidetector CT imaging of the chest was performed using the standard protocol during bolus administration of intravenous contrast. Multiplanar CT image reconstructions and MIPs were obtained to evaluate the vascular anatomy.  CONTRAST:  27mL OMNIPAQUE IOHEXOL 350 MG/ML SOLN  COMPARISON:  Prior radiograph from earlier the same [place]  FINDINGS: The visualized thyroid is within normal limits. No pathologically enlarged mediastinal, hilar, or axillary lymph nodes are identified.  Aorta is of normal caliber with normal appearance. Great vessels are within normal limits. Incidental note made of a bovine arch.  Heart size within normal limits. RV to LV ratio measures 0.8, within normal limits. No pericardial effusion.  Main pulmonary artery is of normal caliber measuring 2.6 cm. Saddle filling defects seen involving the main pulmonary arteries bilaterally, compatible with acute pulmonary embolism. There is extension into the lobar and segmental branches bilaterally, most evident within the left lower lobe.  Lungs are clear without focal infiltrate or pulmonary edema. Linear opacity within the right lower lobe and peripheral right upper lobe most likely reflect atelectasis. No pleural effusion. No pulmonary nodule or mass lesion. Mild upper lobe predominant paraseptal emphysema noted.  Subcentimeter hypodensity within the left hepatic lobe is too small the characterize by CT, but  statistically likely represents a cyst. The remainder of the visualized upper abdomen is within normal limits.  No acute osseous abnormality. Multilevel degenerative changes noted within the visualized spine. No worrisome lytic or blastic osseous lesions. Cervical ACDF partially visualized.  IMPRESSION: 1. Acute saddle pulmonary embolism with extension into lobar and segmental pulmonary arteries bilaterally, with greatest involvement within the left lower lobe segmental branches. No CT evidence of right heart strain. 2. Right basilar atelectasis.  Critical Value/emergent results were called by telephone at the time of interpretation on 02/20/2014 at 12:47 AM to Dr. Pryor Curia , who verbally acknowledged these results.   Electronically Signed   By: Jeannine Boga M.D.   On: 02/20/2014 00:52    Results/Tests Pending at Time of Discharge: None  Discharge Medications:    Medication List    STOP taking these medications       HYDROcodone-acetaminophen 5-325 MG per tablet  Commonly known as:  NORCO/VICODIN      TAKE these medications       albuterol 108 (90 BASE) MCG/ACT inhaler  Commonly known as:  PROVENTIL HFA;VENTOLIN HFA  Inhale 2 puffs into the lungs every 4 (four) hours as needed for wheezing.     butalbital-acetaminophen-caffeine 50-325-40 MG per tablet  Commonly known as:  FIORICET, ESGIC  Take by mouth 2 (two)  times daily as needed for headache.     gabapentin 300 MG capsule  Commonly known as:  NEURONTIN  Take 900 mg by mouth 3 (three) times daily.     hydrochlorothiazide 25 MG tablet  Commonly known as:  HYDRODIURIL  Take 1 tablet (25 mg total) by mouth daily.     lisinopril 20 MG tablet  Commonly known as:  PRINIVIL,ZESTRIL  Take 1 tablet (20 mg total) by mouth daily.     loratadine 10 MG tablet  Commonly known as:  CLARITIN  Take 1 tablet (10 mg total) by mouth daily.     nortriptyline 50 MG capsule  Commonly known as:  PAMELOR  Take 1 capsule (50 mg total)  by mouth at bedtime.     oxyCODONE-acetaminophen 5-325 MG per tablet  Commonly known as:  PERCOCET  Take 1 tablet by mouth every 8 (eight) hours as needed for severe pain.     Rivaroxaban 15 MG Tabs tablet  Commonly known as:  XARELTO  Take 1 tablet (15 mg total) by mouth 2 (two) times daily with a meal.     rivaroxaban 20 MG Tabs tablet  Commonly known as:  XARELTO  Take 1 tablet (20 mg total) by mouth daily with supper. Start taking on May 8  Start taking on:  03/14/2014     Estill Dooms Misc  Apply 1 application topically 2 (two) times daily as needed (leg pain).     simvastatin 20 MG tablet  Commonly known as:  ZOCOR  Take 20 mg by mouth every evening.     tiotropium 18 MCG inhalation capsule  Commonly known as:  SPIRIVA  Place 18 mcg into inhaler and inhale daily.     traZODone 100 MG tablet  Commonly known as:  DESYREL  Take 100 mg by mouth at bedtime.        Discharge Instructions: Please refer to Patient Instructions section of EMR for full details.  Patient was counseled important signs and symptoms that should prompt return to medical care, changes in medications, dietary instructions, activity restrictions, and follow up appointments.   Follow-Up Appointments: Follow-up Information   Follow up with MERRELL, DAVID, MD.   Specialty:  Family Medicine   Contact information:   [place] [id number] [phone]       Cordelia Poche, MD 02/23/2014, 5:59 PM PGY-1, [place]

## 2014-02-22 NOTE — Evaluation (Signed)
Physical Therapy Evaluation Patient Details Name: [name] [name] MRN: [id number] DOB: 02/09/1961 Today's Date: 02/22/2014   History of Present Illness  Pt readmitted with saddle PE and bil DVT's. Pt only home 2 days after dc from hospital following fall with LOC and lt foot drop.  Clinical Impression  Pt doing well with mobility and no further PT needed.  Ready for dc from PT standpoint.      Follow Up Recommendations No PT follow up (Pt unable to get HHPT due to Medicaid)    Equipment Recommendations  None recommended by PT    Recommendations for Other Services       Precautions / Restrictions Precautions Precautions: Fall Precaution Comments: AFO to left foot in shoe for walking Required Braces or Orthoses: Other Brace/Splint Other Brace/Splint: AFO Restrictions Weight Bearing Restrictions: No      Mobility  Bed Mobility Overal bed mobility: Modified Independent                Transfers Overall transfer level: Needs assistance Equipment used: Rolling walker (2 wheeled) Transfers: Sit to/from Stand Sit to Stand: Supervision            Ambulation/Gait Ambulation/Gait assistance: Supervision Ambulation Distance (Feet): 175 Feet Assistive device: Rolling walker (2 wheeled) Gait Pattern/deviations: Step-through pattern;Decreased stride length   Gait velocity interpretation: Below normal speed for age/gender General Gait Details: Pt doing well with lt AFO.  Stairs            Wheelchair Mobility    Modified Rankin (Stroke Patients Only)       Balance Overall balance assessment: History of Falls Sitting-balance support: No upper extremity supported Sitting balance-Leahy Scale: Normal     Standing balance support: No upper extremity supported Standing balance-Leahy Scale: Fair                               Pertinent Vitals/Pain No c/o's    Home Living Family/patient expects to be discharged to:: Private residence    Available Help at Discharge: Family Type of Home: Mobile home Home Access: Stairs to enter Entrance Stairs-Rails: Psychiatric nurse of Steps: 5 Home Layout: One level Home Equipment: Walker - standard;Shower seat;Other (comment) (Lt AFO) Additional Comments: Pt has wheels to put on walker    Prior Function Level of Independence: Independent with assistive device(s)               Hand Dominance        Extremity/Trunk Assessment   Upper Extremity Assessment: Overall WFL for tasks assessed               LLE Deficits / Details: hip, knee WFL; ankle 0/5 DF, inversion, eversion     Communication   Communication: No difficulties  Cognition Arousal/Alertness: Awake/alert Behavior During Therapy: WFL for tasks assessed/performed Overall Cognitive Status: Within Functional Limits for tasks assessed                      General Comments      Exercises        Assessment/Plan    PT Assessment Patent does not need any further PT services  PT Diagnosis     PT Problem List    PT Treatment Interventions     PT Goals (Current goals can be found in the Care Plan section) Acute Rehab PT Goals PT Goal Formulation: No goals set, d/c therapy    Frequency  Barriers to discharge        Co-evaluation               End of Session   Activity Tolerance: Patient tolerated treatment well Patient left: in bed;with call bell/phone within reach Nurse Communication: Mobility status         Time: 1056-1105 PT Time Calculation (min): 9 min   Charges:   PT Evaluation $Initial PT Evaluation Tier I: 1 Procedure     PT G CodesShary Decamp Kadence Mimbs 02/22/2014, 11:23 AM  Suanne Marker PT [phone]

## 2014-02-23 NOTE — Progress Notes (Signed)
FMTS Attending Daily Note:  Annabell Sabal MD  [phone] pager  Family Practice pager:  [phone] I have discussed this patient with the resident Dr. Thomes Dinning.  I agree with their findings, assessment, and care plan.  Improved and OK for DC.  FU with neurology for EMG outpt.  Back pain somewhat improved.  No further dyspnea.  Outpt oral anticoagulation with FU with PCP.

## 2014-02-24 NOTE — Discharge Summary (Signed)
Family Medicine Teaching Service  Discharge Note : Attending Jeff Delshon Blanchfield MD Pager [phone] Inpatient Team Pager:  [phone]  I have reviewed this patient and the patient's chart and have discussed discharge planning with the resident at the time of discharge. I agree with the discharge plan as above.    

## 2014-02-25 ENCOUNTER — Inpatient Hospital Stay: Payer: Self-pay | Admitting: Family Medicine

## 2014-02-28 ENCOUNTER — Ambulatory Visit (HOSPITAL_COMMUNITY)
Admission: RE | Admit: 2014-02-28 | Discharge: 2014-02-28 | Disposition: A | Discharge status: Home / Self Care | Payer: Medicaid Other | Source: Ambulatory Visit | Attending: Family Medicine | Admitting: Family Medicine

## 2014-02-28 ENCOUNTER — Encounter: Payer: Self-pay | Admitting: Family Medicine

## 2014-02-28 ENCOUNTER — Ambulatory Visit (INDEPENDENT_AMBULATORY_CARE_PROVIDER_SITE_OTHER): Payer: Medicaid Other | Admitting: Family Medicine

## 2014-02-28 VITALS — BP 131/85 | HR 107 | Temp 98.5°F | Wt 261.0 lb

## 2014-02-28 DIAGNOSIS — F17200 Nicotine dependence, unspecified, uncomplicated: Secondary | ICD-10-CM

## 2014-02-28 DIAGNOSIS — M545 Low back pain, unspecified: Secondary | ICD-10-CM

## 2014-02-28 DIAGNOSIS — M216X9 Other acquired deformities of unspecified foot: Secondary | ICD-10-CM

## 2014-02-28 DIAGNOSIS — N179 Acute kidney failure, unspecified: Secondary | ICD-10-CM

## 2014-02-28 DIAGNOSIS — I2692 Saddle embolus of pulmonary artery without acute cor pulmonale: Secondary | ICD-10-CM

## 2014-02-28 DIAGNOSIS — M21372 Foot drop, left foot: Secondary | ICD-10-CM

## 2014-02-28 DIAGNOSIS — I10 Essential (primary) hypertension: Secondary | ICD-10-CM

## 2014-02-28 DIAGNOSIS — Z0000 Encounter for general adult medical examination without abnormal findings: Secondary | ICD-10-CM | POA: Insufficient documentation

## 2014-02-28 DIAGNOSIS — Z720 Tobacco use: Secondary | ICD-10-CM

## 2014-02-28 MED ORDER — MORPHINE SULFATE 10 MG/ML IJ SOLN
4.0000 mg | Freq: Once | INTRAMUSCULAR | Status: AC
  Administered 2014-02-28: 4 mg via INTRAMUSCULAR

## 2014-02-28 MED ORDER — TRAMADOL HCL 50 MG PO TABS: 50.0000 mg | ORAL_TABLET | Freq: Three times a day (TID) | ORAL | Status: DC | PRN

## 2014-02-28 MED ORDER — CYCLOBENZAPRINE HCL 10 MG PO TABS: ORAL_TABLET | ORAL | Status: DC

## 2014-02-28 NOTE — Assessment & Plan Note (Signed)
Down to 1/2ppd. Pt to continue

## 2014-02-28 NOTE — Progress Notes (Signed)
[name] [name] is a 53 y.o. male who presents to [facility] today for hospital f/u.  SOB: improved. Taking Xarelto. L foot still swelling.   Back pain: radiation down L leg. H/o sciatica. Chronic condition for pt. Pt w/o benefit in the past. Pt discharged from pain clinic recently and w/o pain medications. Currently only taking gabapentin and flexeril for pain.  Not taking any Tylenol. Pt has never been to see orthopedics. Reviewed MRI w/ pt.   Foot drop: continue on L side. Since fall several weeks ago. No better since going to hospital several weeks ago.   Tobacco: down to 0.5ppd. Not using any further replacement or medications.   The following portions of the patient's history were reviewed and updated as appropriate: allergies, current medications, past medical history, family and social history, and problem list.  Patient is a smoker  Past Medical History  Diagnosis Date  . Constipation due to pain medication   . COPD (chronic obstructive pulmonary disease)   . Status post spinal surgery   . Hypertension   . Anxiety     ROS as above otherwise neg.    Medications reviewed. Current Outpatient Prescriptions  Medication Sig Dispense Refill  . albuterol (PROVENTIL HFA;VENTOLIN HFA) 108 (90 BASE) MCG/ACT inhaler Inhale 2 puffs into the lungs every 4 (four) hours as needed for wheezing.      . butalbital-acetaminophen-caffeine (FIORICET, ESGIC) 50-325-40 MG per tablet Take by mouth 2 (two) times daily as needed for headache.      . gabapentin (NEURONTIN) 300 MG capsule Take 900 mg by mouth 3 (three) times daily.      . hydrochlorothiazide (HYDRODIURIL) 25 MG tablet Take 1 tablet (25 mg total) by mouth daily.  30 tablet  0  . lisinopril (PRINIVIL,ZESTRIL) 20 MG tablet Take 1 tablet (20 mg total) by mouth daily.  30 tablet  0  . loratadine (CLARITIN) 10 MG tablet Take 1 tablet (10 mg total) by mouth daily.  30 tablet  0  . nortriptyline (PAMELOR) 50 MG capsule Take 1 capsule (50 mg total) by mouth  at bedtime.  30 capsule  0  . oxyCODONE-acetaminophen (PERCOCET) 5-325 MG per tablet Take 1 tablet by mouth every 8 (eight) hours as needed for severe pain.  20 tablet  0  . Rivaroxaban (XARELTO) 15 MG TABS tablet Take 1 tablet (15 mg total) by mouth 2 (two) times daily with a meal.  42 tablet  0  . [START ON 03/14/2014] rivaroxaban (XARELTO) 20 MG TABS tablet Take 1 tablet (20 mg total) by mouth daily with supper. Start taking on May 8  30 tablet  0  . Estill Dooms MISC Apply 1 application topically 2 (two) times daily as needed (leg pain).      . simvastatin (ZOCOR) 20 MG tablet Take 20 mg by mouth every evening.      . tiotropium (SPIRIVA) 18 MCG inhalation capsule Place 18 mcg into inhaler and inhale daily.      . traMADol (ULTRAM) 50 MG tablet Take 1-2 tablets (50-100 mg total) by mouth every 8 (eight) hours as needed.  60 tablet  0  . traZODone (DESYREL) 100 MG tablet Take 100 mg by mouth at bedtime.       No current facility-administered medications for this visit.    Exam:  BP 131/85  Pulse 107  Temp(Src) 98.5 F (36.9 C) (Oral)  Wt 261 lb (118.389 kg) Gen: Well NAD HEENT: EOMI,  MMM Lungs: CTABL Nl WOB Heart: RRR no  MRG Abd: NABS, NT, ND MSK: L foot plantar flexion 2/5. Foot brace in place. R foot plantar flexion 4/5  No results found for this or any previous visit (from the past 72 hour(s)).  A/P (as seen in Problem list)  HTN (hypertension) At goal. No change  Saddle pulmonary embolus EKG umnchanged.  Cont Xarelto  Acute renal failure resolved  Tobacco abuse Down to 1/2ppd. Pt to continue  Left foot drop Persistent foot drop. Concern that this is from nerve root compression of Lumbar spine Pt needs f/u w/ Neuro  F/u orthio EMG ordered.  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

## 2014-02-28 NOTE — Assessment & Plan Note (Signed)
resolved 

## 2014-02-28 NOTE — Addendum Note (Signed)
Addended by: Waldemar [name] on: 02/28/2014 09:32 AM   Modules accepted: Orders

## 2014-02-28 NOTE — Patient Instructions (Signed)
You need follow up soon for nerve root compression and pain Please go to [facility] pain management and request your paperwork be faxed to our office.  Our fax is [phone] Please follow up with ortho and neuro. They should be calling you I can give you a one time Rx for tramadol Please double up on your flexeril. If this doesn't work then call me to try changing to another muscle relaxor Stay as mobile as possible Come back to see me in 4 weeks

## 2014-02-28 NOTE — Assessment & Plan Note (Signed)
Persistent foot drop. Concern that this is from nerve root compression of Lumbar spine Pt needs f/u w/ Neuro  F/u orthio EMG ordered.  Pt to f/u w/ neuro

## 2014-02-28 NOTE — Assessment & Plan Note (Signed)
At goal.  No change 

## 2014-02-28 NOTE — Assessment & Plan Note (Addendum)
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

## 2014-02-28 NOTE — Addendum Note (Signed)
Addended by: Valerie Roys on: 02/28/2014 01:49 PM   Modules accepted: Orders

## 2014-02-28 NOTE — Assessment & Plan Note (Signed)
EKG umnchanged.  Cont Xarelto

## 2014-03-02 ENCOUNTER — Emergency Department (HOSPITAL_COMMUNITY): Payer: Medicaid Other

## 2014-03-02 ENCOUNTER — Other Ambulatory Visit: Payer: Self-pay

## 2014-03-02 ENCOUNTER — Encounter (HOSPITAL_COMMUNITY): Payer: Self-pay | Admitting: Emergency Medicine

## 2014-03-02 ENCOUNTER — Inpatient Hospital Stay (HOSPITAL_COMMUNITY)
Admission: EM | Admit: 2014-03-02 | Discharge: 2014-03-04 | DRG: 315 | Disposition: A | Discharge status: Home / Self Care | Payer: Medicaid Other | Attending: Family Medicine | Admitting: Family Medicine

## 2014-03-02 DIAGNOSIS — M216X9 Other acquired deformities of unspecified foot: Secondary | ICD-10-CM | POA: Diagnosis present

## 2014-03-02 DIAGNOSIS — K219 Gastro-esophageal reflux disease without esophagitis: Secondary | ICD-10-CM | POA: Diagnosis present

## 2014-03-02 DIAGNOSIS — E876 Hypokalemia: Secondary | ICD-10-CM | POA: Diagnosis present

## 2014-03-02 DIAGNOSIS — I959 Hypotension, unspecified: Principal | ICD-10-CM | POA: Diagnosis present

## 2014-03-02 DIAGNOSIS — E871 Hypo-osmolality and hyponatremia: Secondary | ICD-10-CM | POA: Diagnosis present

## 2014-03-02 DIAGNOSIS — Z79899 Other long term (current) drug therapy: Secondary | ICD-10-CM

## 2014-03-02 DIAGNOSIS — G8929 Other chronic pain: Secondary | ICD-10-CM | POA: Diagnosis present

## 2014-03-02 DIAGNOSIS — R0789 Other chest pain: Secondary | ICD-10-CM | POA: Diagnosis present

## 2014-03-02 DIAGNOSIS — F17200 Nicotine dependence, unspecified, uncomplicated: Secondary | ICD-10-CM | POA: Diagnosis present

## 2014-03-02 DIAGNOSIS — E785 Hyperlipidemia, unspecified: Secondary | ICD-10-CM | POA: Diagnosis present

## 2014-03-02 DIAGNOSIS — M545 Low back pain, unspecified: Secondary | ICD-10-CM

## 2014-03-02 DIAGNOSIS — F329 Major depressive disorder, single episode, unspecified: Secondary | ICD-10-CM | POA: Diagnosis present

## 2014-03-02 DIAGNOSIS — E872 Acidosis, unspecified: Secondary | ICD-10-CM | POA: Diagnosis present

## 2014-03-02 DIAGNOSIS — Z7901 Long term (current) use of anticoagulants: Secondary | ICD-10-CM

## 2014-03-02 DIAGNOSIS — Z86711 Personal history of pulmonary embolism: Secondary | ICD-10-CM

## 2014-03-02 DIAGNOSIS — R1310 Dysphagia, unspecified: Secondary | ICD-10-CM

## 2014-03-02 DIAGNOSIS — Z720 Tobacco use: Secondary | ICD-10-CM | POA: Diagnosis present

## 2014-03-02 DIAGNOSIS — F3289 Other specified depressive episodes: Secondary | ICD-10-CM | POA: Diagnosis present

## 2014-03-02 DIAGNOSIS — I2692 Saddle embolus of pulmonary artery without acute cor pulmonale: Secondary | ICD-10-CM | POA: Diagnosis present

## 2014-03-02 DIAGNOSIS — F411 Generalized anxiety disorder: Secondary | ICD-10-CM | POA: Diagnosis present

## 2014-03-02 DIAGNOSIS — J449 Chronic obstructive pulmonary disease, unspecified: Secondary | ICD-10-CM | POA: Diagnosis present

## 2014-03-02 DIAGNOSIS — R079 Chest pain, unspecified: Secondary | ICD-10-CM

## 2014-03-02 DIAGNOSIS — I10 Essential (primary) hypertension: Secondary | ICD-10-CM | POA: Diagnosis present

## 2014-03-02 DIAGNOSIS — M25569 Pain in unspecified knee: Secondary | ICD-10-CM

## 2014-03-02 DIAGNOSIS — M21372 Foot drop, left foot: Secondary | ICD-10-CM

## 2014-03-02 DIAGNOSIS — J4489 Other specified chronic obstructive pulmonary disease: Secondary | ICD-10-CM | POA: Diagnosis present

## 2014-03-02 DIAGNOSIS — K5909 Other constipation: Secondary | ICD-10-CM | POA: Diagnosis present

## 2014-03-02 HISTORY — DX: Other pulmonary embolism without acute cor pulmonale: I26.99

## 2014-03-02 HISTORY — DX: Tobacco use: Z72.0

## 2014-03-02 LAB — BASIC METABOLIC PANEL
CO2: 25 mEq/L (ref 19–32)
Calcium: 9.9 mg/dL (ref 8.4–10.5)
GFR calc Af Amer: 69 mL/min — ABNORMAL LOW (ref >90)
Glucose, Bld: 107 mg/dL — ABNORMAL HIGH (ref 70–99)
Potassium: 3.6 mEq/L — ABNORMAL LOW (ref 3.7–5.3)
Sodium: 133 mEq/L — ABNORMAL LOW (ref 137–147)

## 2014-03-02 LAB — CBC
HCT: 40.8 % (ref 39.0–52.0)
Hemoglobin: 14.8 g/dL (ref 13.0–17.0)
MCH: 33.9 pg (ref 26.0–34.0)
MCHC: 36.3 g/dL — ABNORMAL HIGH (ref 30.0–36.0)
MCV: 93.6 fL (ref 78.0–100.0)
Platelets: 460 10*3/uL — ABNORMAL HIGH (ref 150–400)
RBC: 4.36 MIL/uL (ref 4.22–5.81)
RDW: 12.7 % (ref 11.5–15.5)
WBC: 13.3 10*3/uL — ABNORMAL HIGH (ref 4.0–10.5)

## 2014-03-02 LAB — PROTIME-INR
INR: 1.19 (ref 0.00–1.49)
Prothrombin Time: 14.8 seconds (ref 11.6–15.2)

## 2014-03-02 LAB — BASIC METABOLIC PANEL WITH GFR
BUN: 15 mg/dL (ref 6–23)
Chloride: 92 meq/L — ABNORMAL LOW (ref 96–112)
Creatinine, Ser: 1.34 mg/dL (ref 0.50–1.35)
GFR calc non Af Amer: 59 mL/min — ABNORMAL LOW (ref >90)

## 2014-03-02 LAB — PRO B NATRIURETIC PEPTIDE: Pro B Natriuretic peptide (BNP): 22.6 pg/mL (ref 0–125)

## 2014-03-02 LAB — APTT: aPTT: 35 seconds (ref 24–37)

## 2014-03-02 LAB — LACTIC ACID, PLASMA: Lactic Acid, Venous: 2.9 mmol/L — ABNORMAL HIGH (ref 0.5–2.2)

## 2014-03-02 LAB — D-DIMER, QUANTITATIVE: D-Dimer, Quant: 2.55 ug{FEU}/mL — ABNORMAL HIGH (ref 0.00–0.48)

## 2014-03-02 LAB — I-STAT TROPONIN, ED: Troponin i, poc: 0 ng/mL (ref 0.00–0.08)

## 2014-03-02 MED ORDER — FENTANYL CITRATE 0.05 MG/ML IJ SOLN
50.0000 ug | Freq: Once | INTRAMUSCULAR | Status: AC
  Administered 2014-03-02: 50 ug via INTRAVENOUS
  Filled 2014-03-02: qty 2

## 2014-03-02 MED ORDER — SODIUM CHLORIDE 0.9 % IV BOLUS (SEPSIS)
1000.0000 mL | Freq: Once | INTRAVENOUS | Status: AC
  Administered 2014-03-02: 1000 mL via INTRAVENOUS

## 2014-03-02 MED ORDER — IOHEXOL 350 MG/ML SOLN
100.0000 mL | Freq: Once | INTRAVENOUS | Status: AC | PRN
  Administered 2014-03-02: 100 mL via INTRAVENOUS

## 2014-03-02 NOTE — ED Notes (Addendum)
Pt. reports pain at neck and lower chest / epigastric area onset yesterday with SOB . Recently admitted 02/19/2014 for PE and COPD. Hypotensive at triage.

## 2014-03-02 NOTE — ED Notes (Signed)
Ghim MD at bedside. 

## 2014-03-02 NOTE — ED Provider Notes (Signed)
CSN: [id number]     Arrival date & time 03/02/14  2112 History   First MD Initiated Contact with Patient 03/02/14 2148     Chief Complaint  Patient presents with  . Chest Pain     (Consider location/radiation/quality/duration/timing/severity/associated sxs/prior Treatment) Patient is a 53 y.o. male presenting with chest pain. The history is provided by the patient, medical records and a relative.  Chest Pain Pain location:  Substernal area Pain quality: burning, pressure and throbbing   Pain radiates to:  Does not radiate Pain radiates to the back: no   Pain severity:  Severe Onset quality:  Gradual Duration:  1 week Timing:  Constant Progression:  Worsening Chronicity:  Recurrent Relieved by:  Nothing Exacerbated by: Eating. Associated symptoms: dizziness, fatigue, shortness of breath and weakness   Associated symptoms: no abdominal pain, no cough, no fever, no nausea and not vomiting     Past Medical History  Diagnosis Date  . Constipation due to pain medication   . COPD (chronic obstructive pulmonary disease)   . Status post spinal surgery   . Hypertension   . Anxiety   . PE (pulmonary embolism)   . Tobacco abuse    Past Surgical History  Procedure Laterality Date  . Spinal fixation surgery     Family History  Problem Relation Age of Onset  . Cancer Neg Hx    History  Substance Use Topics  . Smoking status: Current Every [place] Smoker -- 1.00 packs/[place]    Types: Cigarettes  . Smokeless tobacco: Not on file     Comment: decreased to 1ppd  . Alcohol Use: Not on file    Review of Systems  Constitutional: Positive for appetite change and fatigue. Negative for fever.  Respiratory: Positive for shortness of breath. Negative for cough.   Cardiovascular: Positive for chest pain.  Gastrointestinal: Negative for nausea, vomiting and abdominal pain.  Neurological: Positive for dizziness, weakness and light-headedness. Negative for syncope.  All other systems reviewed  and are negative.     Allergies  Fentanyl and Methadone  Home Medications   Prior to Admission medications   Medication Sig Start Date End Date Taking? Authorizing Provider  albuterol (PROVENTIL HFA;VENTOLIN HFA) 108 (90 BASE) MCG/ACT inhaler Inhale 2 puffs into the lungs every 4 (four) hours as needed for wheezing.   Yes Historical Provider, MD  butalbital-acetaminophen-caffeine (FIORICET, ESGIC) 50-325-40 MG per tablet Take by mouth 2 (two) times daily as needed for headache.   Yes Historical Provider, MD  cyclobenzaprine (FLEXERIL) 10 MG tablet TAKE ONE TABLET BY MOUTH THREE TIMES DAILY AS NEEDED FOR MUSCLE SPASM 02/28/14  Yes Waldemar [name], MD  gabapentin (NEURONTIN) 300 MG capsule Take 900 mg by mouth 3 (three) times daily.   Yes Historical Provider, MD  hydrochlorothiazide (HYDRODIURIL) 25 MG tablet Take 1 tablet (25 mg total) by mouth daily. 02/22/14  Yes Dayarmys Piloto de Gwendalyn Ege, MD  lisinopril (PRINIVIL,ZESTRIL) 20 MG tablet Take 1 tablet (20 mg total) by mouth daily. 02/22/14  Yes Dayarmys [place] de Gwendalyn Ege, MD  loratadine (CLARITIN) 10 MG tablet Take 1 tablet (10 mg total) by mouth daily. 02/22/14  Yes Dayarmys [place] de Gwendalyn Ege, MD  nortriptyline (PAMELOR) 50 MG capsule Take 1 capsule (50 mg total) by mouth at bedtime. 02/22/14  Yes Dayarmys [place] de Gwendalyn Ege, MD  oxyCODONE-acetaminophen (PERCOCET) 5-325 MG per tablet Take 1 tablet by mouth every 8 (eight) hours as needed for severe pain. 02/22/14  Yes [place]  Larose Kells, MD  Rivaroxaban (XARELTO) 15 MG TABS tablet Take 1 tablet (15 mg total) by mouth 2 (two) times daily with a meal. 02/22/14  Yes Dayarmys Piloto de Gwendalyn Ege, MD  Estill Dooms MISC Apply 1 application topically 2 (two) times daily as needed (leg pain).   Yes Historical Provider, MD  simvastatin (ZOCOR) 20 MG tablet Take 20 mg by mouth every evening.   Yes Historical Provider, MD  tiotropium (SPIRIVA) 18 MCG inhalation capsule Place 18 mcg into inhaler and inhale  daily.   Yes Historical Provider, MD  traMADol (ULTRAM) 50 MG tablet Take 1-2 tablets (50-100 mg total) by mouth every 8 (eight) hours as needed. 02/28/14  Yes Waldemar [name], MD  traZODone (DESYREL) 100 MG tablet Take 100 mg by mouth at bedtime.   Yes Historical Provider, MD  rivaroxaban (XARELTO) 20 MG TABS tablet Take 1 tablet (20 mg total) by mouth daily with supper. Start taking on May 8 03/14/14   Dayarmys Piloto de Gwendalyn Ege, MD   BP 129/87  Pulse 92  Temp(Src) 98.3 F (36.8 C) (Oral)  Resp 23  SpO2 98% Physical Exam  Nursing note and vitals reviewed. Constitutional: He is oriented to person, place, and time. He appears well-developed and well-nourished.  HENT:  Head: Normocephalic and atraumatic.  Mouth/Throat: Uvula is midline. Mucous membranes are dry.  Eyes: Conjunctivae are normal. No scleral icterus.  Cardiovascular: Regular rhythm and intact distal pulses.  Tachycardia present.   Pulmonary/Chest: Tachypnea noted. He is in respiratory distress. He has no wheezes. He has no rales.  Abdominal: Soft. He exhibits no distension. There is no tenderness.  Neurological: He is alert and oriented to person, place, and time.  Skin: Skin is warm.    ED Course  Procedures (including critical care time) Labs Review Labs Reviewed  CBC - Abnormal; Notable for the following:    WBC 13.3 (*)    MCHC 36.3 (*)    Platelets 460 (*)    All other components within normal limits  BASIC METABOLIC PANEL - Abnormal; Notable for the following:    Sodium 133 (*)    Potassium 3.6 (*)    Chloride 92 (*)    Glucose, Bld 107 (*)    GFR calc non Af Amer 59 (*)    GFR calc Af Amer 69 (*)    All other components within normal limits  D-DIMER, QUANTITATIVE - Abnormal; Notable for the following:    D-Dimer, Quant 2.55 (*)    All other components within normal limits  LACTIC ACID, PLASMA - Abnormal; Notable for the following:    Lactic Acid, Venous 2.9 (*)    All other components within normal limits   PRO B NATRIURETIC PEPTIDE  APTT  PROTIME-INR  I-STAT TROPOININ, ED    Imaging Review Ct Angio Chest W/cm &/or Wo Cm  03/02/2014   CLINICAL DATA:  Chest pain and shortness of breath. Recent diagnosis of pulmonary embolism.  EXAM: CT ANGIOGRAPHY CHEST WITH CONTRAST  TECHNIQUE: Multidetector CT imaging of the chest was performed using the standard protocol during bolus administration of intravenous contrast. Multiplanar CT image reconstructions and MIPs were obtained to evaluate the vascular anatomy.  CONTRAST:  178mL OMNIPAQUE IOHEXOL 350 MG/ML SOLN  COMPARISON:  DG CHEST 1V PORT dated 03/02/2014; CT ANGIO CHEST W/CM &/OR WO/CM dated 02/19/2014  FINDINGS: Main pulmonary artery is not enlarged. Interval breakdown of saddle embolus. Residual filling defect within the right upper lobe segmental to subsegmental branches, nonocclusive. Tiny non  filling pulmonary emboli within the subsegmental branches of the right middle lobe. Nonocclusive thrombus within the left lower lobe segmental to subsegmental branches, improved.  The heart and pericardium are unremarkable, no right heart strain. Two vessel aortic arch, normal variant, but thoracic aorta is overall normal in course and caliber with minimal intimal thickening.  Moderate centrilobular and to lesser extent paraseptal emphysema. No pleural effusions or focal consolidations. Resolution of right lower lobe atelectasis. No pneumothorax.  Unremarkable thoracic esophagus. No lymphadenopathy by CT size criteria; 8 mm right paratracheal lymph node, 8 mm aortopulmonary window lymph node. Included view of the abdomen is unremarkable. Soft tissues and included osseous structures are nonsuspicious. ACDF.  Review of the MIP images confirms the above findings.  IMPRESSION: Improved, decreased residual nonocclusive pulmonary arterial emboli. No thrombus propagation, interval dissolution of saddle component. No right heart strain.   Electronically Signed   By: Elon Alas   On: 03/02/2014 23:22   Dg Chest Port 1 View  03/02/2014   CLINICAL DATA:  Chest pain.  EXAM: PORTABLE CHEST - 1 VIEW  COMPARISON:  CT chest and PA and lateral chest 02/19/2014.  FINDINGS: The lungs are clear. Heart size is normal. No pneumothorax or pleural effusion. No focal bony abnormality.  IMPRESSION: Negative chest.   Electronically Signed   By: Inge Rise M.D.   On: 03/02/2014 21:41     EKG Interpretation At time 21:18, sinus tachycardia, possible inferior infarct, old, seen previously.  Abn ECG, no sig change from ECG on 02/28/14.     Room air saturation is 98% I interpret to be normal   12:01 AM BP improved after IVF's.  CT chest shows improvement of clot burden, no heart strain.  Most likely pt is volume depleted.  Troponin is neg.  Will consult [facility] to see and admit for initial hypotension and shock, treatment of CP.   MDM   Final diagnoses:  Hypotension  Chest pain  Dysphagia     Patient was discharged approximately one week ago after having been admitted for a saddle embolism. The patient was discharged home on Zaroxolyn. The patient has gradually been worsening while at home over the past week. He reports that he has an unusual chest pain that seems worse with swallowing and eating. He reports that he has been feeling lightheaded and faint. Earlier in the week he was feeling faint only when he tried to stand up, but now is feeling extremely weak even when laying in bed.    Saddie Benders. Cheyanna Strick, MD 03/03/14 0001

## 2014-03-03 ENCOUNTER — Telehealth: Payer: Self-pay | Admitting: *Deleted

## 2014-03-03 ENCOUNTER — Inpatient Hospital Stay (HOSPITAL_COMMUNITY): Payer: Medicaid Other

## 2014-03-03 DIAGNOSIS — I959 Hypotension, unspecified: Principal | ICD-10-CM | POA: Diagnosis present

## 2014-03-03 DIAGNOSIS — E785 Hyperlipidemia, unspecified: Secondary | ICD-10-CM

## 2014-03-03 DIAGNOSIS — R1310 Dysphagia, unspecified: Secondary | ICD-10-CM

## 2014-03-03 DIAGNOSIS — F17200 Nicotine dependence, unspecified, uncomplicated: Secondary | ICD-10-CM

## 2014-03-03 DIAGNOSIS — R079 Chest pain, unspecified: Secondary | ICD-10-CM

## 2014-03-03 DIAGNOSIS — M216X9 Other acquired deformities of unspecified foot: Secondary | ICD-10-CM

## 2014-03-03 DIAGNOSIS — J449 Chronic obstructive pulmonary disease, unspecified: Secondary | ICD-10-CM

## 2014-03-03 DIAGNOSIS — I2692 Saddle embolus of pulmonary artery without acute cor pulmonale: Secondary | ICD-10-CM

## 2014-03-03 LAB — CBC
HEMATOCRIT: 36.6 % — AB (ref 39.0–52.0)
Hemoglobin: 12.6 g/dL — ABNORMAL LOW (ref 13.0–17.0)
MCH: 32.3 pg (ref 26.0–34.0)
MCHC: 34.4 g/dL (ref 30.0–36.0)
MCV: 93.8 fL (ref 78.0–100.0)
Platelets: 359 10*3/uL (ref 150–400)
RBC: 3.9 MIL/uL — AB (ref 4.22–5.81)
RDW: 12.8 % (ref 11.5–15.5)
WBC: 10.4 10*3/uL (ref 4.0–10.5)

## 2014-03-03 LAB — BASIC METABOLIC PANEL
BUN: 13 mg/dL (ref 6–23)
CHLORIDE: 102 meq/L (ref 96–112)
CO2: 19 mEq/L (ref 19–32)
Calcium: 8.4 mg/dL (ref 8.4–10.5)
Creatinine, Ser: 0.98 mg/dL (ref 0.50–1.35)
GFR calc Af Amer: >90 mL/min (ref >90)
GFR calc non Af Amer: >90 mL/min (ref >90)
GLUCOSE: 100 mg/dL — AB (ref 70–99)
Potassium: 3.5 mEq/L — ABNORMAL LOW (ref 3.7–5.3)
Sodium: 137 mEq/L (ref 137–147)

## 2014-03-03 LAB — TROPONIN I
Troponin I: <0.3 ng/mL (ref <0.30)
Troponin I: <0.3 ng/mL (ref <0.30)

## 2014-03-03 LAB — LACTIC ACID, PLASMA: LACTIC ACID, VENOUS: 0.7 mmol/L (ref 0.5–2.2)

## 2014-03-03 MED ORDER — SIMVASTATIN 20 MG PO TABS
20.0000 mg | ORAL_TABLET | Freq: Every evening | ORAL | Status: DC
  Administered 2014-03-03 – 2014-03-04 (×2): 20 mg via ORAL
  Filled 2014-03-03 (×2): qty 1

## 2014-03-03 MED ORDER — CYCLOBENZAPRINE HCL 10 MG PO TABS
10.0000 mg | ORAL_TABLET | Freq: Three times a day (TID) | ORAL | Status: DC | PRN
  Administered 2014-03-03 – 2014-03-04 (×4): 10 mg via ORAL
  Filled 2014-03-03 (×4): qty 1

## 2014-03-03 MED ORDER — TRAZODONE HCL 100 MG PO TABS
100.0000 mg | ORAL_TABLET | Freq: Every day | ORAL | Status: DC
  Administered 2014-03-03 (×2): 100 mg via ORAL
  Filled 2014-03-03 (×3): qty 1

## 2014-03-03 MED ORDER — TIOTROPIUM BROMIDE MONOHYDRATE 18 MCG IN CAPS
18.0000 ug | ORAL_CAPSULE | Freq: Every day | RESPIRATORY_TRACT | Status: DC
  Administered 2014-03-03 – 2014-03-04 (×2): 18 ug via RESPIRATORY_TRACT
  Filled 2014-03-03: qty 5

## 2014-03-03 MED ORDER — GI COCKTAIL ~~LOC~~
30.0000 mL | Freq: Once | ORAL | Status: AC
  Administered 2014-03-03: 30 mL via ORAL
  Filled 2014-03-03: qty 30

## 2014-03-03 MED ORDER — NORTRIPTYLINE HCL 25 MG PO CAPS
50.0000 mg | ORAL_CAPSULE | Freq: Every day | ORAL | Status: DC
  Administered 2014-03-03 (×2): 50 mg via ORAL
  Filled 2014-03-03 (×3): qty 2

## 2014-03-03 MED ORDER — ONDANSETRON HCL 4 MG PO TABS: 4.0000 mg | ORAL_TABLET | Freq: Four times a day (QID) | ORAL | Status: DC | PRN

## 2014-03-03 MED ORDER — POTASSIUM CHLORIDE CRYS ER 20 MEQ PO TBCR
40.0000 meq | EXTENDED_RELEASE_TABLET | Freq: Two times a day (BID) | ORAL | Status: AC
  Administered 2014-03-03 (×2): 40 meq via ORAL
  Filled 2014-03-03 (×2): qty 2

## 2014-03-03 MED ORDER — SODIUM CHLORIDE 0.45 % IV SOLN
INTRAVENOUS | Status: DC
  Administered 2014-03-03 – 2014-03-04 (×3): via INTRAVENOUS

## 2014-03-03 MED ORDER — SODIUM CHLORIDE 0.9 % IJ SOLN
3.0000 mL | Freq: Two times a day (BID) | INTRAMUSCULAR | Status: DC
  Administered 2014-03-03: 3 mL via INTRAVENOUS

## 2014-03-03 MED ORDER — SODIUM CHLORIDE 0.9 % IV SOLN
INTRAVENOUS | Status: DC
  Administered 2014-03-03: 02:00:00 via INTRAVENOUS

## 2014-03-03 MED ORDER — LORATADINE 10 MG PO TABS
10.0000 mg | ORAL_TABLET | Freq: Every day | ORAL | Status: DC
  Administered 2014-03-04: 10 mg via ORAL
  Filled 2014-03-03 (×2): qty 1

## 2014-03-03 MED ORDER — ONDANSETRON HCL 4 MG/2ML IJ SOLN: 4.0000 mg | Freq: Four times a day (QID) | INTRAMUSCULAR | Status: DC | PRN

## 2014-03-03 MED ORDER — RIVAROXABAN 15 MG PO TABS
15.0000 mg | ORAL_TABLET | Freq: Two times a day (BID) | ORAL | Status: DC
  Administered 2014-03-03 – 2014-03-04 (×3): 15 mg via ORAL
  Filled 2014-03-03 (×5): qty 1

## 2014-03-03 MED ORDER — ACETAMINOPHEN 325 MG PO TABS
650.0000 mg | ORAL_TABLET | Freq: Four times a day (QID) | ORAL | Status: DC | PRN
  Administered 2014-03-03: 650 mg via ORAL
  Filled 2014-03-03: qty 2

## 2014-03-03 MED ORDER — ACETAMINOPHEN 650 MG RE SUPP: 650.0000 mg | Freq: Four times a day (QID) | RECTAL | Status: DC | PRN

## 2014-03-03 MED ORDER — HYDROMORPHONE HCL PF 1 MG/ML IJ SOLN
1.0000 mg | INTRAMUSCULAR | Status: DC | PRN
  Administered 2014-03-03 – 2014-03-04 (×12): 1 mg via INTRAVENOUS
  Filled 2014-03-03 (×12): qty 1

## 2014-03-03 MED ORDER — PANTOPRAZOLE SODIUM 40 MG IV SOLR
40.0000 mg | INTRAVENOUS | Status: DC
  Administered 2014-03-03 – 2014-03-04 (×2): 40 mg via INTRAVENOUS
  Filled 2014-03-03 (×3): qty 40

## 2014-03-03 MED ORDER — GABAPENTIN 300 MG PO CAPS
900.0000 mg | ORAL_CAPSULE | Freq: Three times a day (TID) | ORAL | Status: DC
  Administered 2014-03-03 – 2014-03-04 (×5): 900 mg via ORAL
  Filled 2014-03-03 (×7): qty 3

## 2014-03-03 MED ORDER — ALBUTEROL SULFATE (2.5 MG/3ML) 0.083% IN NEBU: 2.5000 mg | INHALATION_SOLUTION | RESPIRATORY_TRACT | Status: DC | PRN

## 2014-03-03 MED ORDER — BISACODYL 5 MG PO TBEC
5.0000 mg | DELAYED_RELEASE_TABLET | Freq: Every day | ORAL | Status: DC | PRN
  Filled 2014-03-03: qty 1

## 2014-03-03 MED ORDER — RIVAROXABAN 15 MG PO TABS
15.0000 mg | ORAL_TABLET | ORAL | Status: AC
  Administered 2014-03-03: 15 mg via ORAL
  Filled 2014-03-03: qty 1

## 2014-03-03 NOTE — Progress Notes (Signed)
Orders received for MBS. Discussed with MD as all symptoms appear to be GI/esophageal in nature. Order intended for barium swallow to evaluate esophageal function. MD correcting order. Please consult SLP in future if needed.  [place], [place] [phone]

## 2014-03-03 NOTE — Evaluation (Signed)
Physical Therapy Evaluation Patient Details Name: [name] [name] MRN: [id number] DOB: 12-Jul-1961 Today's Date: 03/03/2014   History of Present Illness  Adm 03/02/14 with hypotension and episode of 'blacking out" per pt. Pt recently with 2 admissions due to fall with resultant Lt foot drop (?peroneal nerve injury, awaiting outpatient EMG) and due to bil LE DVTs with saddle PE. PMHx- back surgery, chronic back pain with d/c from pain clinic due to non-compliance   Clinical Impression  Patient known to PT from previous 2 recent admissions. Patient evaluated by Physical Therapy with no further acute PT needs identified. He is knowledgeable re: use of AFO and RW and has not fallen since beginning to use these. All education has been completed and the patient has no further questions. PT is signing off. Thank you for this referral.     Follow Up Recommendations No PT follow up    Equipment Recommendations  None recommended by PT    Recommendations for Other Services       Precautions / Restrictions Precautions Precautions: Fall Precaution Comments: AFO to left foot in shoe for walking Required Braces or Orthoses: Other Brace/Splint Other Brace/Splint: AFO      Mobility  Bed Mobility Overal bed mobility: Modified Independent             General bed mobility comments: use of rail; HOB 0; incr time due to pain  Transfers Overall transfer level: Needs assistance Equipment used: Rolling walker (2 wheeled) Transfers: Sit to/from Stand Sit to Stand: Supervision         General transfer comment: supervision due to dizziness with + orthostasis  Ambulation/Gait             General Gait Details: deferred due to orthostasis; pt denies any questions re: use of brace or RW; states he has done fine at home x 1.5 weeks (since last admission)  Stairs            Wheelchair Mobility    Modified Rankin (Stroke Patients Only)       Balance Overall balance assessment:  History of Falls Sitting-balance support: No upper extremity supported;Feet supported Sitting balance-Leahy Scale: Normal     Standing balance support: No upper extremity supported Standing balance-Leahy Scale: Fair                               Pertinent Vitals/Pain Back pain, not rated however asking for RN to bring pain medicine    Home Living Family/patient expects to be discharged to:: Private residence Living Arrangements: Spouse/significant other Available Help at Discharge: Family Type of Home: Mobile home Home Access: Stairs to enter Entrance Stairs-Rails: Psychiatric nurse of Steps: [place]: One level Home Equipment: Environmental consultant - 2 wheels;Shower seat Additional Comments: reports he put wheels on his walker since last admission (turned them to inside and is able to fit through all areas of his home)    Prior Function Level of Independence: Independent with assistive device(s)         Comments: with RW and Lt AFO; has not fallen since began using these     Hand Dominance        Extremity/Trunk Assessment   Upper Extremity Assessment: Overall WFL for tasks assessed               LLE Deficits / Details: hip, knee WFL; ankle 0/5 DF, plantarflexion, inversion, eversion  Cervical / Trunk Assessment: Normal  Communication  Communication: No difficulties  Cognition Arousal/Alertness: Awake/alert Behavior During Therapy: WFL for tasks assessed/performed Overall Cognitive Status: Within Functional Limits for tasks assessed                      General Comments      Exercises        Assessment/Plan    PT Assessment [place] does not need any further PT services  PT Diagnosis     PT Problem List    PT Treatment Interventions     PT Goals (Current goals can be found in the Care Plan section)      Frequency     Barriers to discharge        Co-evaluation               End of Session   Activity  Tolerance: Treatment limited secondary to medical complications (Comment) (orthostasis) Patient left: in bed;with call bell/phone within reach Nurse Communication: Mobility status;Other (comment) (orthostasis)         Time: 3570-1779 PT Time Calculation (min): 16 min   Charges:   PT Evaluation $Initial PT Evaluation Tier I: 1 Procedure     PT G CodesJeanie Cooks Carrie Usery 03/09/14, 2:01 PM Pager [phone]

## 2014-03-03 NOTE — Progress Notes (Signed)
Utilization Review Completed.Neoma Laming T Dowell4/27/2015

## 2014-03-03 NOTE — ED Notes (Signed)
MD at bedside. 

## 2014-03-03 NOTE — ED Notes (Signed)
Family practice MD at bedside.

## 2014-03-03 NOTE — H&P (Signed)
Attending Addendum  I examined the patient and discussed the assessment and plan with Dr. Venetia Maxon. I have reviewed the note and agree.  Briefly, 53 yo F with recent PE and b/l DVT on xarelto BID admitted for dizziness x one [place] with standing and chest heaviness and SOB while in the bath tub last night. He also reports 5 yrs of intermittent dysphagia and odynophagia. He is a smoker x 39 years. Cut down from 1 PPD to 1/2 PPD after most recent hospitalization this month.   This AM no dizziness. Improvement in chest heaviness and SOB. Thirsty. NPO pending swallow studies.   Soc Hx: denies ETOH  BP 130/89  Pulse 87  Temp(Src) 97.7 F (36.5 C) (Oral)  Resp 18  Ht 5\' 11"  (1.803 m)  Wt 244 lb 11.4 oz (111 kg)  BMI 34.15 kg/m2  SpO2 98% General appearance: alert, cooperative and no distress Neck: no adenopathy, no carotid bruit, no JVD and supple, symmetrical, trachea midline Lungs: clear to auscultation bilaterally Heart: regular rate and rhythm, S1, S2 normal, no murmur, click, rub or gallop Abdomen: soft, non-tender; bowel sounds normal; no masses,  no organomegaly Extremities: extremities normal, atraumatic, no cyanosis or edema  A/P:  53 yo M smoker with PE and DVT on xarelto.   1. Chest heaviness and SOB: interval improvement in PE with dissolution of saddle component. Rule out ACS. Symptoms started at rest so atypical. Suspect GERD.   2. Dizziness: patient was orthostatic yesterday in the ED. Also noted to have mild hyponatremia and hypokalemia.  Improved today s/p IVF and holding HCTZ. Plan to repeat orthostatic VS today. Most likely d/c off HCTZ. Consider ACEi or CCB.   3. FEN/GI: NPO pending swallow eval.   4. Dispo: pending clinical improvement and w/u.       Boykin Nearing, [place]

## 2014-03-03 NOTE — Telephone Encounter (Signed)
Kristeen Miss from [facility] called requesting records for a referral that was placed.  Records faxed to [phone].  Derl Barrow, RN

## 2014-03-03 NOTE — H&P (Signed)
[facility] Admission History and Physical Service Pager: [phone]  Patient name: [name] [name] Medical record number: [id number] Date of birth: 1960/12/06 Age: 53 y.o. Gender: male  Primary Care Provider: MERRELL, DAVID, MD Consultants: none to date Code Status: FULL  Chief Complaint: hypotension, chest pain, dysphagia  Assessment and Plan: [name] [name] is a 53 y.o. male presenting with hypotension and chest pain, as well as dysphagia. PMH significant for recently diagnosed saddle PE / DVT (now on Xarelto; single-Norment dosing to start May 8), HTN, HLD, COPD, depression, chronic pain, and left foot drop.  #Hypotension, Hx of HTN - BP resolved now normotensive to slightly hypertensive s/p fluids in the ED - WBC mildly elevated and lactic acid 2.9, question related to hypotension; will repeat labs in the AM - question if HCTZ is causing overdiuresis; no obvious signs / symptoms suggestive of infectious etiology - continue IVF for now, to support PO intake - hold home HCTZ and lisinopril; consider stopping HCTZ completely - consider other causes of increased UOP; no obvious other causes per med list or labs - monitor closely with telemetry; consider further work-up / specialist consult if needed  #Chest pain - very atypical, possibly related to hypotension and/or dysphagia; CTA shows no new clot, EKG normal - will cycle troponins (POC negative in the ED) - GI cocktail and PPI ordered (see immediately below) - repeat EKG in the AM or as needed - see also PE / DVT below  #Dysphagia - uncertain etiology; GERD-component, gastritis, intermittent spasm all possible - uncertain if this has contributed to hypotension / apparent dehydration or not - likely contribution of smoking to symptoms - ordered for Protonix and GI cocktail - will request MBS and SLP evaluation - will consider GI consult pending further results / progression of symptoms  #Saddle embolism / DVT -  recently diagnosed; see recent H&P / progress notes / DC summary - no new clot burden on repeat imaging - continue Xarelto, monitor for any new symptoms / changes  #Tobacco use - praised cutting back to 1/2ppd; continue to counsel for complete cessation; declines patch  #Foot drop / chronic pain - in the process of being referred to neurology as outpt for EMG studies and to ortho and new pain clinic - ordered for home gabapentin, Flexeril - hold Ultram as ineffective; ordered for Dilaudid for now (s/p Fentanyl in the ED) - transition to PO meds as tolerated (?oxycodone) - definitely needs close PCP f/u; Dr. Marily Memos is working very hard with pt to establish better routine / appropriate care  #COPD, hx allergies - continue home Spiriva, Claritin; albuterol as needed  #HLD - continue home Zocor  #Hx depression - continue home Pamelor  FEN/GI: heart-healthy diet, 1/2NS at 100 mL/h; meds as above Prophylaxis: on Xarelto; PPI as above  Disposition: admit to telemetry bed, attending Dr. Adrian Blackwater, with management as above  History of Present Illness: [name] [name] is a 53 y.o. male presenting with hypotension and chest pain, as well as dysphagia. PMH significant for recently diagnosed saddle PE / DVT (now on Xarelto; single-Kimber dosing to start May 8), HTN, HLD, COPD, depression, chronic pain, and left foot drop. He has felt dizzy / lightheaded (without room-spinning sensation) and weak since earlier today (4/26), which is new for him. His chest discomfort is central, nonradiating, and described as a pressure that has been better since he arrived in the ED; there is no definite exertional component. Since recent discharge, pt reports he has had  intermittent dysphagia in which his throat will without warning feel like it's "stuck together," causing him to be unable to swallow liquids or solids completely. He does not take a PPI or other medication and has not seen a GI doctor to his knowledge; he does not  think he has reflux pain all the time but does describe some "burning" sensation for a couple of weeks, also intermittently. He feels like this dysphagia has not caused him to eat or drink less than normal, but he feels as though he does urinate often which he attributes strongly to his HCTZ.  He reports compliance with medications. He does state that he has been on Ultram from Dr. Marily Memos (his PCP) while he is in the process of being referred to orthopedics, neurology, and a new pain clinic (recently dismissed from his previous pain clinic for noncompliance, though pt reports difficulty getting some Rx's at [facility], etc); Ultram does not help his pain at all. He is a current smoker, though he has cut back from 2ppd to 1/2 ppd, and is hopeful to stop completely.  He denies other abdominal pain, new numbness / weakness other than his left foot drop, skin rashes, N/V/D, visual change, headache.  Review Of Systems: Per HPI. Otherwise 12 point review of systems was performed and was unremarkable.  Patient Active Problem List   Diagnosis Date Noted  . Hypotension 03/03/2014  . Saddle pulmonary embolus 02/20/2014  . Low back pain 02/16/2014  . Left foot drop 02/14/2014  . Fall 02/14/2014  . Hypokalemia 02/14/2014  . Leukocytosis 02/14/2014  . HTN (hypertension) 11/15/2011  . Hyperlipidemia 11/15/2011  . COPD (chronic obstructive pulmonary disease) 11/15/2011  . Nerve pain 11/15/2011  . Tobacco abuse 11/15/2011  . Chronic knee pain 11/15/2011   Past Medical History: Past Medical History  Diagnosis Date  . Constipation due to pain medication   . COPD (chronic obstructive pulmonary disease)   . Status post spinal surgery   . Hypertension   . Anxiety   . PE (pulmonary embolism)   . Tobacco abuse    Past Surgical History: Past Surgical History  Procedure Laterality Date  . Spinal fixation surgery     Social History: History  Substance Use Topics  . Smoking status: Current Every Stoker  Smoker -- 1.00 packs/[place]    Types: Cigarettes  . Smokeless tobacco: Not on file     Comment: decreased to 1ppd  . Alcohol Use: Not on file   Please also refer to relevant sections of EMR.  Family History: Family History  Problem Relation Age of Onset  . Cancer Neg Hx    Allergies and Medications: Allergies  Allergen Reactions  . Fentanyl     Per pt he had a near overdose on these due to "regulating body temp"  . Methadone Itching   No current facility-administered medications on file prior to encounter.   Current Outpatient Prescriptions on File Prior to Encounter  Medication Sig Dispense Refill  . albuterol (PROVENTIL HFA;VENTOLIN HFA) 108 (90 BASE) MCG/ACT inhaler Inhale 2 puffs into the lungs every 4 (four) hours as needed for wheezing.      . butalbital-acetaminophen-caffeine (FIORICET, ESGIC) 50-325-40 MG per tablet Take by mouth 2 (two) times daily as needed for headache.      . cyclobenzaprine (FLEXERIL) 10 MG tablet TAKE ONE TABLET BY MOUTH THREE TIMES DAILY AS NEEDED FOR MUSCLE SPASM  90 tablet  3  . gabapentin (NEURONTIN) 300 MG capsule Take 900 mg by mouth 3 (  three) times daily.      . hydrochlorothiazide (HYDRODIURIL) 25 MG tablet Take 1 tablet (25 mg total) by mouth daily.  30 tablet  0  . lisinopril (PRINIVIL,ZESTRIL) 20 MG tablet Take 1 tablet (20 mg total) by mouth daily.  30 tablet  0  . loratadine (CLARITIN) 10 MG tablet Take 1 tablet (10 mg total) by mouth daily.  30 tablet  0  . nortriptyline (PAMELOR) 50 MG capsule Take 1 capsule (50 mg total) by mouth at bedtime.  30 capsule  0  . oxyCODONE-acetaminophen (PERCOCET) 5-325 MG per tablet Take 1 tablet by mouth every 8 (eight) hours as needed for severe pain.  20 tablet  0  . Rivaroxaban (XARELTO) 15 MG TABS tablet Take 1 tablet (15 mg total) by mouth 2 (two) times daily with a meal.  42 tablet  0  . Estill Dooms MISC Apply 1 application topically 2 (two) times daily as needed (leg pain).      . simvastatin (ZOCOR)  20 MG tablet Take 20 mg by mouth every evening.      . tiotropium (SPIRIVA) 18 MCG inhalation capsule Place 18 mcg into inhaler and inhale daily.      . traMADol (ULTRAM) 50 MG tablet Take 1-2 tablets (50-100 mg total) by mouth every 8 (eight) hours as needed.  60 tablet  0  . traZODone (DESYREL) 100 MG tablet Take 100 mg by mouth at bedtime.      Derrill Memo ON 03/14/2014] rivaroxaban (XARELTO) 20 MG TABS tablet Take 1 tablet (20 mg total) by mouth daily with supper. Start taking on May 8  30 tablet  0    Objective: BP 129/79  Pulse 93  Temp(Src) 98.3 F (36.8 C) (Oral)  Resp 16  SpO2 98% Exam: General: adult male, lying in bed in no acute distress; does appear uncomfortable HEENT: /AT, EOMI, PERRLA, TM's clear bilaterally  MMM, posterior oropharynx mildly erythematous Cardiovascular: RRR, no murmur appreciated Respiratory: CTAB, no wheezes, normal WOB Abdomen: soft, nontender throughout; BS+ Extremities: no LE edema Skin: warm, dry, intact; not diaphoretic Neuro: alert / oriented x3; strength 5/5 in both bilateral UE and in right LE  No dorsi or plantarflexion of left foot; unchanged from recent discharge  Labs and Imaging: CBC BMET   Recent Labs Lab 03/02/14 2131  WBC 13.3*  HGB 14.8  HCT 40.8  PLT 460*    Recent Labs Lab 03/02/14 2131  NA 133*  K 3.6*  CL 92*  CO2 25  BUN 15  CREATININE 1.34  GLUCOSE 107*  CALCIUM 9.9     POC troponin: NEGATIVE EKG 4/27 @0126 : NSR, nonischemic  CTA hcest 4/26 @2309 : improved clot burden, no new clot, no evidence of RH strain  Emmaline Kluver, MD 03/03/2014, 12:14 AM PGY-2, [place] Intern pager: [phone], text pages welcome

## 2014-03-04 DIAGNOSIS — G8929 Other chronic pain: Secondary | ICD-10-CM

## 2014-03-04 DIAGNOSIS — M25569 Pain in unspecified knee: Secondary | ICD-10-CM

## 2014-03-04 LAB — CBC
HEMATOCRIT: 36.2 % — AB (ref 39.0–52.0)
HEMOGLOBIN: 12.3 g/dL — AB (ref 13.0–17.0)
MCH: 32 pg (ref 26.0–34.0)
MCHC: 34 g/dL (ref 30.0–36.0)
MCV: 94.3 fL (ref 78.0–100.0)
Platelets: 328 10*3/uL (ref 150–400)
RBC: 3.84 MIL/uL — ABNORMAL LOW (ref 4.22–5.81)
RDW: 12.5 % (ref 11.5–15.5)
WBC: 7.4 10*3/uL (ref 4.0–10.5)

## 2014-03-04 LAB — BASIC METABOLIC PANEL
BUN: 11 mg/dL (ref 6–23)
CHLORIDE: 106 meq/L (ref 96–112)
CO2: 21 meq/L (ref 19–32)
Calcium: 8.5 mg/dL (ref 8.4–10.5)
Creatinine, Ser: 0.87 mg/dL (ref 0.50–1.35)
GFR calc non Af Amer: >90 mL/min (ref >90)
Glucose, Bld: 99 mg/dL (ref 70–99)
Potassium: 4.6 mEq/L (ref 3.7–5.3)
Sodium: 140 mEq/L (ref 137–147)

## 2014-03-04 MED ORDER — OXYCODONE-ACETAMINOPHEN 5-325 MG PO TABS: 1.0000 | ORAL_TABLET | Freq: Three times a day (TID) | ORAL | Status: DC | PRN

## 2014-03-04 MED ORDER — LISINOPRIL 20 MG PO TABS
20.0000 mg | ORAL_TABLET | Freq: Every day | ORAL | Status: DC
  Administered 2014-03-04: 20 mg via ORAL
  Filled 2014-03-04: qty 1

## 2014-03-04 MED ORDER — OXYCODONE-ACETAMINOPHEN 5-325 MG PO TABS
1.0000 | ORAL_TABLET | Freq: Three times a day (TID) | ORAL | Status: DC | PRN
  Administered 2014-03-04: 1 via ORAL
  Filled 2014-03-04: qty 1

## 2014-03-04 NOTE — Progress Notes (Signed)
Family Medicine Teaching Service Daily Progress Note Intern Pager: [phone]  Patient name: [name] [name] Medical record number: [id number] Date of birth: 1961/07/16 Age: 53 y.o. Gender: male  Primary Care Provider: Linna Darner, MD Consultants: none Code Status: full  Pt Overview and Major Events to Date:  4/27: Hypotension, Hx of recent PE  Assessment and Plan: [name] [name] is a 53 y.o. male presenting with hypotension and chest pain, as well as dysphagia. PMH significant for recently diagnosed saddle PE / DVT (now on Xarelto; single-Lau dosing to start May 8), HTN, HLD, COPD, depression, chronic pain, and left foot drop.   #Hypotensive Shock, resolved now slightly hypertensive s/p fluids in the ED  - WBC mildly elevated and lactic acid 2.9 on admit - wnl now, question related to hypotension - question if HCTZ is causing overdiuresis; no obvious signs / symptoms suggestive of infectious etiology  - IVF as below - holding home HCTZ consider stopping completely  - Restarted ACE-I - consider other causes of increased UOP; no obvious other causes per med list or labs  - monitor closely with telemetry; consider further work-up / specialist consult if needed   #Chest pain - very atypical, possibly related to hypotension and/or dysphagia; CTA shows no new clot, EKG normal  - Troponins neg  - GI cocktail and PPI ordered (see immediately below)  - repeat EKG: NSR - see also PE / DVT below   #Dysphagia - uncertain etiology; GERD-component, gastritis, intermittent spasm all possible  - uncertain if this has contributed to hypotension / apparent dehydration or not  - likely contribution of smoking to symptoms  - ordered for Protonix and GI cocktail  - MBS: Although assessment was limited, esophageal motility appear grossly normal - will consider GI consult pending further results / progression of symptoms - consider CCB  #Saddle embolism / DVT - recently diagnosed; see recent H&P / progress  notes / DC summary  - no new clot burden on repeat imaging  - continue Xarelto, monitor for any new symptoms / changes   # Tobacco use - praised cutting back to 1/2ppd; continue to counsel for complete cessation; declines patch   #Foot drop / chronic pain  - in the process of being referred to neurology as outpt for EMG studies and to ortho and new pain clinic  - ordered for home gabapentin, Flexeril  - Reordered home Percocet; hold Ultram as ineffective; Stopped Dilaudid - definitely needs close PCP f/u; Dr. Marily Memos is working very hard with pt to establish better routine / appropriate care   #COPD, hx allergies - continue home Spiriva, Claritin; albuterol as needed  #HLD - continue home Zocor  #Hx depression - continue home Pamelor   FEN/GI: heart-healthy diet, KVO Prophylaxis: on Xarelto; PPI as above  Disposition: home today with PCP f/u  Subjective:  Complains of chronic left leg pain but denies CP or SOB today.   Objective: Temp:  [97.2 F (36.2 C)-98 F (36.7 C)] 97.2 F (36.2 C) (04/28 0500) Pulse Rate:  [82-99] 82 (04/28 0500) Resp:  [16-18] 16 (04/28 0500) BP: (147-155)/(96-103) 155/96 mmHg (04/28 0500) SpO2:  [87 %-99 %] 97 % (04/28 0500) Weight:  [243 lb 9.7 oz (110.5 kg)] 243 lb 9.7 oz (110.5 kg) (04/28 0500) Physical Exam: General: adult male, lying in bed in no acute distress; Cardiovascular: RRR, no murmur appreciated  Respiratory: CTAB, no wheezes, normal WOB  Abdomen: soft, nontender throughout; BS+  Extremities: no LE edema  Skin: warm, dry, intact; not  diaphoretic  MSK: No dorsi or plantarflexion of left foot; unchanged from recent discharge  Laboratory:  Recent Labs Lab 03/02/14 2131 03/03/14 0414  WBC 13.3* 10.4  HGB 14.8 12.6*  HCT 40.8 36.6*  PLT 460* 359    Recent Labs Lab 03/02/14 2131 03/03/14 0414 03/04/14 0447  NA 133* 137 140  K 3.6* 3.5* 4.6  CL 92* 102 106  CO2 25 19 21   BUN 15 13 11   CREATININE 1.34 0.98 0.87  CALCIUM  9.9 8.4 8.5  GLUCOSE 107* 100* 99   POC troponin: NEGATIVE  EKG 4/27 @0126 : NSR, nonischemic  Imaging/Diagnostic Tests: CTA hcest 4/26 @2309 : improved clot burden, no new clot, no evidence of RH strain  Phill Myron, MD 03/04/2014, 8:20 AM PGY-1, [place] Intern pager: [phone], text pages welcome

## 2014-03-04 NOTE — Discharge Summary (Signed)
FMTS ATTENDING  NOTE Kehinde Eniola,MD I  have seen and examined this patient, reviewed their chart. I have discussed this patient with the resident. I agree with the resident's findings, assessment and care plan. 

## 2014-03-04 NOTE — Clinical Documentation Improvement (Signed)
   Per ED Note patient was asked to be admitted with "hypovolemia" and "shock". If you agree based on clinical indicators please add to documentation to reflect severity of illness and risk of mortality. Thank you.  Possible Clinical Conditions? - Hypovolemic Shock - Shock - Other Condition  - 4/26 BP 72/22, 87/51, 104/66, 116/79  - 4/26 Resp 35, 34, 30 - 4/26 Pulse 109, 100, 93 - Treatment: 1/2 NS IV 100 ml/hr  Thank You, Ezekiel Ina ,RN Clinical Documentation Specialist:  [phone]  [place] Information Management

## 2014-03-04 NOTE — Progress Notes (Signed)
FMTS ATTENDING  NOTE [name] Brisbon,MD I  have seen and examined this patient, reviewed their chart. I have discussed this patient with the resident. I agree with the resident's findings, assessment and care plan.  Patient has not had anymore episodes of dysphagia since admission,he denies any chest pain.No new symptoms,feels much better. As discussed with patient, I recommended outpatient follow up for his dysphagia to r/o malignancy due to hx of chronic smoking, plan for his PCP to do outpatient referral to GI for EGD. Patient verbalized understanding. I agree with restarting him on ACE1 and holding his HCTZ for his HTN due to hypotensive episode, some other medications he is on can potentially lower his BP,but since his BP is now holding up we will defer adjustment to his PCP.

## 2014-03-04 NOTE — Discharge Instructions (Signed)
[name] [name] you were hospitalized for low blood pressure. This was likely caused by your blood pressure medications, one of which (Hydrochlorothiazide) was stopped. You should continue to take Lisinopril. You should continue to follow-up with your primary care doctor Marily Memos for your ongoing pain management needs.

## 2014-03-04 NOTE — Discharge Summary (Signed)
[facility] Discharge Summary  Patient name: [name] [name] Medical record number: [id number] Date of birth: [birth year] Age: 53 y.o. Gender: male Date of Admission: 03/02/2014  Date of Discharge: 03/04/14 Admitting Physician: Minerva Ends, MD  Primary Care Provider: Linna Darner, MD Consultants: none  Indication for Hospitalization: Hypotensive shock - responsive to IVF  Discharge Diagnoses/Problem List:  Hypotensive shock: mild; responded to IVFs Chest pain Recent Saddle Embolism / DVT HTN Dysphagia COPD/Tobacco abuse Foot drop HLD Depression  Disposition: home  Discharge Condition: stable  Brief Hospital Course:  [name] [name] is a 53 y.o. male presenting with hypotension (70-80/20-50s), dizziness and chest pain, as well as dysphagia. PMH significant for recently diagnosed saddle PE / DVT (now on Xarelto; single-Grabert dosing to start May 8), HTN, HLD, COPD, depression, chronic pain, and left foot drop. Hypotension and lactic acidosis (2.9) resolved s/p fluids in the ED. This was thought to be caused by HTN medications in setting of recently diagnosed Saddle embolism. Repeat CT angio showed improved clot burden, no new clot, and no evidence of RH strain. His EKG was NSR and Troponins negative x 3. His HCTZ was discontinued on discharge and Lisinopril restarted prior to discharge and BP stable.   #Hypotensive Shock, resolved s/p fluids in the ED. Lactic acid 2.9 on admit resolved with IVF. No obvious signs / symptoms suggestive of infectious etiology. Discontinued HCTZ. Restarted ACE-I prior to d/c and tolerated this well.  If continues to have Dizziness s/p stopping HCTZ; Consider stopping TCA, Flexeril, Percocet or Trazodone.   # Saddle embolism / DVT - recently diagnosed; see recent H&P / progress notes / DC summary. No new clot burden on repeat imaging. Continued Xarelto.  # Chest pain - Very atypical, possibly related to hypotension, current PE and/or  dysphagia. CTA as above. Troponins negative x 3. EKG: NSR   # Dysphagia - uncertain etiology: MBS: Although assessment was limited, esophageal motility appear grossly normal. Consider GI referral due to SCC risk factors: Smoking, GERD. Consider CCB for possible esophageal spasm.    # Foot drop / chronic pain: In the process of being referred to neurology as outpt for EMG studies and to ortho and new pain clinic. Continued home gabapentin, Flexeril. Refilled Percocet as below.  PCP f/u; Dr. Marily Memos is working very hard with pt to establish better routine / appropriate care.    #COPD, hx allergies - continued home Spiriva, Claritin; albuterol as needed. Praised cutting back to 1/2ppd; continue to counsel for complete cessation.  #HLD - continued home Zocor  #Hx depression - continued home Pamelor   Issues for Follow Up:   Given Rx for Percocet 5-325mg  # 20  Consider GI referral for dysphagia- risk factors for SCC smoking, GERD  Consider starting Calcium Channel blocker for dysphagia if BP tolerates  If continues to have Dizziness s/p stopping HCTZ; Consider stopping TCA, Flexeril, Percocet or Trazodone  Significant Procedures: none  Significant Labs and Imaging:   Recent Labs Lab 03/02/14 2131 03/03/14 0414  WBC 13.3* 10.4  HGB 14.8 12.6*  HCT 40.8 36.6*  PLT 460* 359    Recent Labs Lab 03/02/14 2131 03/03/14 0414 03/04/14 0447  NA 133* 137 140  K 3.6* 3.5* 4.6  CL 92* 102 106  CO2 25 19 21   GLUCOSE 107* 100* 99  BUN 15 13 11   CREATININE 1.34 0.98 0.87  CALCIUM 9.9 8.4 8.5      Recent Labs Lab 03/03/14 0414 03/03/14 0920 03/03/14 1330  TROPONINI <  0.30 <0.30 <0.30   Lactic acid: 0.7 < 2.9  BNP    Component Value Date/Time   PROBNP 22.6 03/02/2014 2131    Ct Angio Chest W/cm &/or Wo Cm 03/02/2014   IMPRESSION: Improved, decreased residual nonocclusive pulmonary arterial emboli. No thrombus propagation, interval dissolution of saddle component. No right  heart strain.   Ct Angio Chest Pe W/cm &/or Wo Cm 02/20/2014  IMPRESSION: 1. Acute saddle pulmonary embolism with extension into lobar and segmental pulmonary arteries bilaterally, with greatest involvement within the left lower lobe segmental branches. No CT evidence of right heart strain. 2. Right basilar atelectasis.  Critical Value/emergent results were called by telephone at the time of interpretation on 02/20/2014 at 12:47 AM to Dr. Pryor Curia , who verbally acknowledged these results.    Ct Cervical Spine Wo Contrast 02/14/2014  IMPRESSION: 1. Normal noncontrast CT scan of the brain. 2. There are postfusion changes at C5-6 and C6-7. There is mild degenerative disc bulging at C3-4. There is no evidence of an acute cervical spine fracture nor dislocation.    Mr Lumbar Spine Wo Contrast 02/14/2014   IMPRESSION: No significant change from the prior examination.  Summary of pertinent findings includes:  T12-L1 shallow left posterior lateral protrusion. Slight indentation left lateral aspect of the thecal sac. Minimal crowding of adjacent nerve roots.  L4-5 bulge which centrally touches but does not cause significant compression of the thecal sac. Left foraminal/lateral disc osteophyte with slight encroachment upon but not significant compression of the exiting left L4 nerve root.  L5-S1 broad-based disc osteophyte complex. Centrally no compression of the S1 nerve roots or thecal sac. Laterally, extension greater on the right with mild encroachment upon the exiting right L5 nerve root.     Dg Esophagus 03/03/2014  IMPRESSION: 1. No findings to account patient's symptoms. 2. Although assessment was limited, esophageal motility appear grossly normal.    Dg Chest Port 1 View 03/02/2014   IMPRESSION: Negative chest.     Dg Chest Port 1 View 02/14/2014  IMPRESSION: Minimal right lung base atelectasis versus scarring.    Results/Tests Pending at Time of Discharge: none  Discharge Medications:     Medication List    STOP taking these medications       hydrochlorothiazide 25 MG tablet  Commonly known as:  HYDRODIURIL      TAKE these medications       albuterol 108 (90 BASE) MCG/ACT inhaler  Commonly known as:  PROVENTIL HFA;VENTOLIN HFA  Inhale 2 puffs into the lungs every 4 (four) hours as needed for wheezing.     butalbital-acetaminophen-caffeine 50-325-40 MG per tablet  Commonly known as:  FIORICET, ESGIC  Take by mouth 2 (two) times daily as needed for headache.     cyclobenzaprine 10 MG tablet  Commonly known as:  FLEXERIL  TAKE ONE TABLET BY MOUTH THREE TIMES DAILY AS NEEDED FOR MUSCLE SPASM     gabapentin 300 MG capsule  Commonly known as:  NEURONTIN  Take 900 mg by mouth 3 (three) times daily.     lisinopril 20 MG tablet  Commonly known as:  PRINIVIL,ZESTRIL  Take 1 tablet (20 mg total) by mouth daily.     loratadine 10 MG tablet  Commonly known as:  CLARITIN  Take 1 tablet (10 mg total) by mouth daily.     nortriptyline 50 MG capsule  Commonly known as:  PAMELOR  Take 1 capsule (50 mg total) by mouth at bedtime.     oxyCODONE-acetaminophen 5-325  MG per tablet  Commonly known as:  PERCOCET  Take 1 tablet by mouth every 8 (eight) hours as needed for severe pain.     Rivaroxaban 15 MG Tabs tablet  Commonly known as:  XARELTO  Take 1 tablet (15 mg total) by mouth 2 (two) times daily with a meal.     rivaroxaban 20 MG Tabs tablet  Commonly known as:  XARELTO  Take 1 tablet (20 mg total) by mouth daily with supper. Start taking on May 8  Start taking on:  03/14/2014     Estill Dooms Misc  Apply 1 application topically 2 (two) times daily as needed (leg pain).     simvastatin 20 MG tablet  Commonly known as:  ZOCOR  Take 20 mg by mouth every evening.     tiotropium 18 MCG inhalation capsule  Commonly known as:  SPIRIVA  Place 18 mcg into inhaler and inhale daily.     traMADol 50 MG tablet  Commonly known as:  ULTRAM  Take 1-2 tablets (50-100 mg  total) by mouth every 8 (eight) hours as needed.     traZODone 100 MG tablet  Commonly known as:  DESYREL  Take 100 mg by mouth at bedtime.       Discharge Instructions: Please refer to Patient Instructions section of EMR for full details.  Patient was counseled important signs and symptoms that should prompt return to medical care, changes in medications, dietary instructions, activity restrictions, and follow up appointments.   Follow-Up Appointments: Follow-up Information   Follow up with Garret Reddish, MD On 03/10/2014. (Apt @ 2:30)    Specialty:  Family Medicine   Contact information:   [place] [address] [phone]       Phill Myron, MD 03/04/2014, 2:51 PM PGY-1, [place]

## 2014-03-05 ENCOUNTER — Encounter: Payer: Self-pay | Admitting: Family Medicine

## 2014-03-06 ENCOUNTER — Other Ambulatory Visit: Payer: Self-pay | Admitting: Family Medicine

## 2014-03-07 ENCOUNTER — Telehealth: Payer: Self-pay | Admitting: Family Medicine

## 2014-03-07 NOTE — Telephone Encounter (Signed)
[name] [name] contact the [facility] demanding his records to be sent over today.. They said they would fax and he would like to be notified that they came in and if not he was going to seek legal action against them.  Please call him asap when they arrived.

## 2014-03-07 NOTE — Telephone Encounter (Signed)
Pt called and wanted Dr. Marily Memos to know that the ortho specialist said he thinks that the problem is in his brain. He is also waiting for the pain clinic to fax over his records. Plus he is almost out of pain medication. jw

## 2014-03-07 NOTE — Telephone Encounter (Signed)
Pt is aware that notes were received.  He is to follow up on Monday with Dr. Yong Channel.  Will give pt a copy of records when he gets here. Saumya Hukill,CMA

## 2014-03-10 ENCOUNTER — Encounter: Payer: Self-pay | Admitting: Family Medicine

## 2014-03-10 ENCOUNTER — Other Ambulatory Visit: Payer: Self-pay | Admitting: *Deleted

## 2014-03-10 ENCOUNTER — Ambulatory Visit (INDEPENDENT_AMBULATORY_CARE_PROVIDER_SITE_OTHER): Payer: Medicaid Other | Admitting: Family Medicine

## 2014-03-10 VITALS — BP 117/87 | HR 112 | Temp 97.7°F | Ht 71.0 in | Wt 245.3 lb

## 2014-03-10 DIAGNOSIS — M545 Low back pain, unspecified: Secondary | ICD-10-CM

## 2014-03-10 DIAGNOSIS — I2692 Saddle embolus of pulmonary artery without acute cor pulmonale: Secondary | ICD-10-CM

## 2014-03-10 DIAGNOSIS — I10 Essential (primary) hypertension: Secondary | ICD-10-CM

## 2014-03-10 DIAGNOSIS — I959 Hypotension, unspecified: Secondary | ICD-10-CM

## 2014-03-10 MED ORDER — OXYCODONE-ACETAMINOPHEN 5-325 MG PO TABS: 1.0000 | ORAL_TABLET | Freq: Three times a day (TID) | ORAL | Status: DC | PRN

## 2014-03-10 NOTE — Patient Instructions (Signed)
Glad your dizziness has improved. Since it is mainly due to pain, we are going to refill your pain medicine at present.   Schedule a visit on the 18th to follow up with Dr. Marily Memos after your nerve studies.   Thanks for your time, Dr. Yong Channel  Health Maintenance Due  Topic Date Due  . Colonoscopy  01/30/2012

## 2014-03-10 NOTE — Assessment & Plan Note (Signed)
Resolved when off HCTZ and continued on xarelto (saddle embolism likely contributed to hypotensive shock in hospital)

## 2014-03-10 NOTE — Progress Notes (Signed)
Garret Reddish, MD Phone: [phone]  Subjective:   [name] [name] is a 53 y.o. year old very pleasant male patient who presents with the following:  Hospital Follow up Hospitalized from 4/26 to 4/28 for hypotensive shock which was thought to be related to hypertension and BP medications in addition to recently discovered saddle pulmonary embolism. Patient states that he is no longer dizzy or lightheaded except when he has pain from his chronic pain in his back and left lower leg. His HCTZ was held at time of discharge and he has continued off of this medicine. Continues on xarelto.   Patient's main complaint today is his left low back and LLL pain. Left foot drop is unchanged. He brings in a complicated chart describing different pains in different areas of his legs (I have kept this to be placed in Dr. Baker Janus box). He recently saw orthopedics who stated his pain is greatly out of proportion to his level of impingement on MRI. They state he is not an operative candidate per the patient. He has an appointment on Wednesday with neurology and will have EMG studies in relation to his foot drop at that time. He is on 2700mg  of gabapentin each Abelson. He has 2 days left of percocet and requests refill. Patient believes the left ankle swells intermittently  Of note, patient had also complained of some trouble swallowing while in the hospital. He had an ssentially normal modified barium swallow study. He states that his dysphagia has resolved completely.   ROS- no fever/chills. No chest pain. Shortness of breath has improved on xarelto. Minor fatigue. No nausea/vomiting. No unintentional weight loss.   Past Medical History- saddle PE, left foot drop and left side assumed nerve pain, left low back pain, hypertension, hyperlipidemia, COPD  Medications- reviewed and updated Current Outpatient Prescriptions  Medication Sig Dispense Refill  . albuterol (PROVENTIL HFA;VENTOLIN HFA) 108 (90 BASE) MCG/ACT  inhaler Inhale 2 puffs into the lungs every 4 (four) hours as needed for wheezing.      . butalbital-acetaminophen-caffeine (FIORICET, ESGIC) 50-325-40 MG per tablet Take by mouth 2 (two) times daily as needed for headache.      . cyclobenzaprine (FLEXERIL) 10 MG tablet TAKE ONE TABLET BY MOUTH THREE TIMES DAILY AS NEEDED FOR MUSCLE SPASM  90 tablet  3  . gabapentin (NEURONTIN) 300 MG capsule Take 900 mg by mouth 3 (three) times daily.      Marland Kitchen lisinopril (PRINIVIL,ZESTRIL) 20 MG tablet Take 1 tablet (20 mg total) by mouth daily.  30 tablet  0  . loratadine (CLARITIN) 10 MG tablet Take 1 tablet (10 mg total) by mouth daily.  30 tablet  0  . nortriptyline (PAMELOR) 50 MG capsule Take 1 capsule (50 mg total) by mouth at bedtime.  30 capsule  0  . oxyCODONE-acetaminophen (PERCOCET) 5-325 MG per tablet Take 1 tablet by mouth every 8 (eight) hours as needed for severe pain.  30 tablet  0  . Rivaroxaban (XARELTO) 15 MG TABS tablet Take 1 tablet (15 mg total) by mouth 2 (two) times daily with a meal.  42 tablet  0  . Estill Dooms MISC Apply 1 application topically 2 (two) times daily as needed (leg pain).      . simvastatin (ZOCOR) 20 MG tablet Take 20 mg by mouth every evening.      . tiotropium (SPIRIVA) 18 MCG inhalation capsule Place 18 mcg into inhaler and inhale daily.      . traZODone (DESYREL) 100 MG tablet  Take 100 mg by mouth at bedtime.      Derrill Memo ON 03/14/2014] rivaroxaban (XARELTO) 20 MG TABS tablet Take 1 tablet (20 mg total) by mouth daily with supper. Start taking on May 8  30 tablet  0  . traMADol (ULTRAM) 50 MG tablet Take 1-2 tablets (50-100 mg total) by mouth every 8 (eight) hours as needed.  60 tablet  0   No current facility-administered medications for this visit.    Objective: BP 117/87  Pulse 112  Temp(Src) 97.7 F (36.5 C) (Oral)  Ht 5\' 11"  (1.803 m)  Wt 245 lb 4.8 oz (111.267 kg)  BMI 34.23 kg/m2 Gen: NAD, resting comfortably in chair despite reporting 10/10 pain CV: RRR  no murmurs rubs or gallops Lungs: CTAB no crackles, wheeze, rhonchi Ext and neuro: no edema. Wearing Left foot brace. 3/5 strength lower leg flexion, 4/5 extension, 2/5 plantar flexion and dorsiflexion.   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   Hypotension Resolved when off HCTZ and continued on xarelto (saddle embolism likely contributed to hypotensive shock in hospital)  HTN (hypertension) Well controlled. Continue on lisinopril alone.   Low back pain Per patient, non operative candidate for nerve root compression at L5. Awaiting records. In mean time, patient to go to neuro for EMG studies to evaluate left low back and leg pain. I did give #30 percocet to allow patient time to get back to Dr. Marily Memos for long term planning. Patient is working on establishing at a new pain clinic.    Meds ordered this encounter  Medications  . oxyCODONE-acetaminophen (PERCOCET) 5-325 MG per tablet    Sig: Take 1 tablet by mouth every 8 (eight) hours as needed for severe pain.    Dispense:  30 tablet    Refill:  0

## 2014-03-10 NOTE — Assessment & Plan Note (Signed)
Per patient, non operative candidate for nerve root compression at L5. Awaiting records. In mean time, patient to go to neuro for EMG studies to evaluate left low back and leg pain. I did give #30 percocet to allow patient time to get back to Dr. Marily Memos for long term planning. Patient is working on establishing at a new pain clinic.

## 2014-03-10 NOTE — Assessment & Plan Note (Signed)
Well controlled. Continue on lisinopril alone.

## 2014-03-10 NOTE — Assessment & Plan Note (Signed)
Stable on xarelto and symptoms improving.

## 2014-03-12 ENCOUNTER — Ambulatory Visit (INDEPENDENT_AMBULATORY_CARE_PROVIDER_SITE_OTHER): Payer: Self-pay

## 2014-03-12 ENCOUNTER — Ambulatory Visit (INDEPENDENT_AMBULATORY_CARE_PROVIDER_SITE_OTHER): Payer: Medicaid Other | Admitting: Neurology

## 2014-03-12 DIAGNOSIS — Z0289 Encounter for other administrative examinations: Secondary | ICD-10-CM

## 2014-03-12 DIAGNOSIS — G5700 Lesion of sciatic nerve, unspecified lower limb: Secondary | ICD-10-CM

## 2014-03-12 DIAGNOSIS — G5702 Lesion of sciatic nerve, left lower limb: Secondary | ICD-10-CM

## 2014-03-12 DIAGNOSIS — G5730 Lesion of lateral popliteal nerve, unspecified lower limb: Secondary | ICD-10-CM

## 2014-03-12 NOTE — Procedures (Signed)
     HISTORY:  [name] [name] is a 53 year old patient with a history of a syncopal event that occurred at home approximately one month ago. He was hospitalized with acute renal failure felt secondary to dehydration. The patient had apparently lost consciousness when he stood up, and then went down on his buttocks area. Since that time, he has had weakness in the left leg that has been persistent. He is being evaluated for this weakness.  NERVE CONDUCTION STUDIES:  Nerve conduction studies were performed on both lower extremities. The distal motor latencies and motor amplitudes for the right peroneal and posterior tibial nerves were normal, with normal nerve conduction velocities for these nerves. The right peroneal sensory latency was normal, and the right H reflex latency was normal. The study of the left peroneal and posterior tibial nerves were unobtainable, and the peroneal sensory latency on the left is unobtainable. The left H reflex latency is unobtainable.  EMG STUDIES:  EMG study was performed on the left lower extremity:  The tibialis anterior muscle reveals no voluntary motor units with no recruitment. 2+ fibrillations and positive waves were seen. The peroneus tertius muscle reveals no voluntary motor units with no recruitment. 3+ fibrillations and positive waves were seen. The medial gastrocnemius muscle reveals no voluntary motor units with no recruitment. 2+ fibrillations and positive waves were seen. The vastus lateralis muscle reveals 2 to 4K motor units with full recruitment. No fibrillations or positive waves were seen. The iliopsoas muscle reveals 2 to 4 K motor units with full recruitment. No fibrillations or positive waves were seen. The biceps femoris muscle (long head) reveals no voluntary motor units with no recruitment. 2+ fibrillations and positive waves were seen. The gluteus medius muscle reveals 1 to 3 K motor units with full recruitment. No fibrillations or positive  waves were seen. The lumbosacral paraspinal muscles were tested at 3 levels, and revealed no abnormalities of insertional activity at all 3 levels tested. There was good relaxation.   IMPRESSION:  Nerve conduction studies done on both lower extremities shows no abnormalities on the right leg, with no response for the left peroneal and posterior tibial nerves. EMG evaluation correlates well with the nerve conduction study, showing severe denervation with no voluntary muscle contraction within all muscles innervated by the sciatic nerve. 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 There is no evidence of a left lumbosacral radiculopathy.  Jill Alexanders MD 03/12/2014 1:47 PM  [facility] [address] [place], [place] [address]  Phone [phone] Fax [phone]

## 2014-03-14 ENCOUNTER — Telehealth: Payer: Self-pay | Admitting: Clinical

## 2014-03-14 NOTE — Telephone Encounter (Signed)
CSW received a call from pt [facility] case worker Idelle Leech who informed CSW that pt is requesting a referral to a pain clinic as well as a shower bench. Idelle Leech also informed CSW that she completed a PHQ9 with pt. Pt scored a 14 - pt reports his mother passing away a few days ago and being unable to make the service. Pt presents with some depression and will be referred to a therapist per [facility].  Hunt Oris, MSW, [place]

## 2014-03-17 ENCOUNTER — Emergency Department (HOSPITAL_COMMUNITY)
Admission: EM | Admit: 2014-03-17 | Discharge: 2014-03-17 | Disposition: A | Discharge status: Nursing Home (Includes ICF) | Payer: Medicaid Other | Source: Home / Self Care | Attending: Family Medicine | Admitting: Family Medicine

## 2014-03-17 ENCOUNTER — Telehealth: Payer: Self-pay | Admitting: Family Medicine

## 2014-03-17 ENCOUNTER — Encounter (HOSPITAL_COMMUNITY): Payer: Self-pay | Admitting: Emergency Medicine

## 2014-03-17 ENCOUNTER — Other Ambulatory Visit: Payer: Self-pay | Admitting: *Deleted

## 2014-03-17 DIAGNOSIS — M79609 Pain in unspecified limb: Secondary | ICD-10-CM

## 2014-03-17 DIAGNOSIS — M79606 Pain in leg, unspecified: Principal | ICD-10-CM

## 2014-03-17 DIAGNOSIS — G8929 Other chronic pain: Secondary | ICD-10-CM

## 2014-03-17 DIAGNOSIS — G894 Chronic pain syndrome: Secondary | ICD-10-CM

## 2014-03-17 DIAGNOSIS — M21372 Foot drop, left foot: Secondary | ICD-10-CM

## 2014-03-17 DIAGNOSIS — M25569 Pain in unspecified knee: Secondary | ICD-10-CM

## 2014-03-17 NOTE — ED Notes (Signed)
Spoke to patient at front desk.  Patient has specialist and pcp.  Patient has extreme pain, but not new pain.  Assured patient that we have 3 providers, but unclear what could be done in this setting .  Radiology films are significantly more advanced than what is ordered here, patient has percocet that he says does not help.  Patient aware he may go to ed after physician exam.

## 2014-03-17 NOTE — Telephone Encounter (Signed)
Pt called and wanted to know how much longer it was going to be before he gets a referral to a pain clinic. Blima Rich

## 2014-03-17 NOTE — ED Notes (Signed)
Patient did not want to ride in shuttle.  Family member to drive patient to ed.

## 2014-03-17 NOTE — ED Notes (Signed)
See prior note

## 2014-03-17 NOTE — ED Provider Notes (Signed)
CSN: [id number]     Arrival date & time 03/17/14  1808 History   First MD Initiated Contact with Patient 03/17/14 2010     No chief complaint on file.  (Consider location/radiation/quality/duration/timing/severity/associated sxs/prior Treatment) Patient is a 53 y.o. male presenting with leg pain. The history is provided by the patient and the spouse.  Leg Pain Location:  Leg Injury: no   Leg location:  L lower leg, L upper leg and L leg Pain details:    Quality:  Burning   Severity:  Severe   Onset quality:  Gradual   Duration:  2 months   Progression:  Worsening Chronicity:  Chronic (has had emg and ncs on leg recently.) Dislocation: no     Past Medical History  Diagnosis Date  . Constipation due to pain medication   . COPD (chronic obstructive pulmonary disease)   . Status post spinal surgery   . Hypertension   . Anxiety   . PE (pulmonary embolism)   . Tobacco abuse    Past Surgical History  Procedure Laterality Date  . Spinal fixation surgery     Family History  Problem Relation Age of Onset  . Cancer Neg Hx    History  Substance Use Topics  . Smoking status: Current Every Conaty Smoker -- 1.00 packs/Abadi    Types: Cigarettes  . Smokeless tobacco: Not on file     Comment: decreased to 1ppd  . Alcohol Use: Not on file    Review of Systems  Neurological: Positive for weakness and numbness.    Allergies  Fentanyl and Methadone  Home Medications   Prior to Admission medications   Medication Sig Start Date End Date Taking? Authorizing Provider  albuterol (PROVENTIL HFA;VENTOLIN HFA) 108 (90 BASE) MCG/ACT inhaler Inhale 2 puffs into the lungs every 4 (four) hours as needed for wheezing.    Historical Provider, MD  butalbital-acetaminophen-caffeine (FIORICET, ESGIC) 50-325-40 MG per tablet Take by mouth 2 (two) times daily as needed for headache.    Historical Provider, MD  cyclobenzaprine (FLEXERIL) 10 MG tablet TAKE ONE TABLET BY MOUTH THREE TIMES DAILY AS  NEEDED FOR MUSCLE SPASM 02/28/14   Waldemar [name], MD  gabapentin (NEURONTIN) 300 MG capsule Take 900 mg by mouth 3 (three) times daily.    Historical Provider, MD  lisinopril (PRINIVIL,ZESTRIL) 20 MG tablet Take 1 tablet (20 mg total) by mouth daily. 02/22/14   Dayarmys Piloto de Gwendalyn Ege, MD  loratadine (CLARITIN) 10 MG tablet Take 1 tablet (10 mg total) by mouth daily. 02/22/14   Dayarmys Piloto de Gwendalyn Ege, MD  nortriptyline (PAMELOR) 50 MG capsule Take 1 capsule (50 mg total) by mouth at bedtime. 02/22/14   Dayarmys Piloto de Gwendalyn Ege, MD  oxyCODONE-acetaminophen (PERCOCET) 5-325 MG per tablet Take 1 tablet by mouth every 8 (eight) hours as needed for severe pain. 03/10/14   Marin Olp, MD  Rivaroxaban (XARELTO) 15 MG TABS tablet Take 1 tablet (15 mg total) by mouth 2 (two) times daily with a meal. 02/22/14   Dayarmys Piloto de Gwendalyn Ege, MD  rivaroxaban (XARELTO) 20 MG TABS tablet Take 1 tablet (20 mg total) by mouth daily with supper. Start taking on May 8 03/14/14   Dayarmys Piloto de Gwendalyn Ege, MD  Estill Dooms MISC Apply 1 application topically 2 (two) times daily as needed (leg pain).    Historical Provider, MD  simvastatin (ZOCOR) 20 MG tablet Take 20 mg by mouth every evening.    Historical Provider,  MD  tiotropium (SPIRIVA) 18 MCG inhalation capsule Place 18 mcg into inhaler and inhale daily.    Historical Provider, MD  traMADol (ULTRAM) 50 MG tablet Take 1-2 tablets (50-100 mg total) by mouth every 8 (eight) hours as needed. 02/28/14   Waldemar [name], MD  traZODone (DESYREL) 100 MG tablet Take 100 mg by mouth at bedtime.    Historical Provider, MD   BP 154/119  Pulse 104  Temp(Src) 98.6 F (37 C) (Oral)  SpO2 99% Physical Exam  Nursing note and vitals reviewed. Constitutional: He is oriented to person, place, and time. He appears well-developed and well-nourished. He appears distressed.  Neurological: He is alert and oriented to person, place, and time.  Skin: Skin is warm and dry.    ED  Course  Procedures (including critical care time) Labs Review Labs Reviewed - No data to display  Imaging Review No results found.   MDM   1. Chronic leg pain   sent for eval by FP resident for chronic pain issues.    Billy Fischer, MD 03/17/14 2021

## 2014-03-17 NOTE — Telephone Encounter (Signed)
Will forward to MD to place a new referral for pain clinic. Aretha Levi,CMA

## 2014-03-18 MED ORDER — RIVAROXABAN 20 MG PO TABS: 20.0000 mg | ORAL_TABLET | Freq: Every day | ORAL | Status: DC

## 2014-03-18 MED ORDER — HYDROCHLOROTHIAZIDE 25 MG PO TABS
25.0000 mg | ORAL_TABLET | Freq: Every day | ORAL | Status: DC
Start: ? — End: 2014-03-24

## 2014-03-18 NOTE — Telephone Encounter (Signed)
Referral placed in pain clinic workqueue and notes faxed to April.  Pt is aware of this and was told that there is month and a half wait.  He also states that he has a follow up with you on 03/24/14 but will run out of medication 4 days prior.  Is he able to get enough pain medication to last til he comes in?  Pt would also like to know what his results of the EMG are.  They are under procedure tab in the chart review.  Please advise. Thanks [employer]

## 2014-03-21 MED ORDER — NORTRIPTYLINE HCL 50 MG PO CAPS: 50.0000 mg | ORAL_CAPSULE | Freq: Every day | ORAL | Status: DC

## 2014-03-21 MED ORDER — LISINOPRIL 20 MG PO TABS: 20.0000 mg | ORAL_TABLET | Freq: Every day | ORAL | Status: DC

## 2014-03-21 MED ORDER — RIVAROXABAN 15 MG PO TABS: 15.0000 mg | ORAL_TABLET | Freq: Two times a day (BID) | ORAL | Status: DC

## 2014-03-21 MED ORDER — OXYCODONE-ACETAMINOPHEN 5-325 MG PO TABS: 1.0000 | ORAL_TABLET | Freq: Three times a day (TID) | ORAL | Status: DC | PRN

## 2014-03-21 NOTE — Assessment & Plan Note (Signed)
Trying to get pt into pain mgt at [facility] for several months Referral in place for several weeks. Pt records faxed some time ago Will have staff call to see where in process of intake pt is Will not treat pt long term w/ opioids  Fill short refill at this time w/ 15 perc Of note pt on significantly fewer pain medications than previously and believe pt could be on far less than when he was in pain clinic

## 2014-03-21 NOTE — Addendum Note (Signed)
Addended by: Marily Memos, DAVID J on: 03/21/2014 02:25 PM   Modules accepted: Orders

## 2014-03-21 NOTE — Assessment & Plan Note (Signed)
EMG noting severe L sided sciatica.  Will refer pt to neurosurge for possible spinal surgical intervention

## 2014-03-21 NOTE — Telephone Encounter (Signed)
Pt is aware of this.  Will discuss with Merrell on Monday complete records not received.  Jazmin [name],CMA

## 2014-03-24 ENCOUNTER — Ambulatory Visit (INDEPENDENT_AMBULATORY_CARE_PROVIDER_SITE_OTHER): Payer: Medicaid Other | Admitting: Family Medicine

## 2014-03-24 VITALS — BP 156/100 | HR 102 | Temp 98.4°F

## 2014-03-24 DIAGNOSIS — M21372 Foot drop, left foot: Secondary | ICD-10-CM

## 2014-03-24 DIAGNOSIS — F329 Major depressive disorder, single episode, unspecified: Secondary | ICD-10-CM | POA: Insufficient documentation

## 2014-03-24 DIAGNOSIS — F32A Depression, unspecified: Secondary | ICD-10-CM

## 2014-03-24 DIAGNOSIS — M545 Low back pain, unspecified: Secondary | ICD-10-CM

## 2014-03-24 DIAGNOSIS — I10 Essential (primary) hypertension: Secondary | ICD-10-CM

## 2014-03-24 DIAGNOSIS — I2692 Saddle embolus of pulmonary artery without acute cor pulmonale: Secondary | ICD-10-CM

## 2014-03-24 DIAGNOSIS — M792 Neuralgia and neuritis, unspecified: Secondary | ICD-10-CM

## 2014-03-24 DIAGNOSIS — F3289 Other specified depressive episodes: Secondary | ICD-10-CM

## 2014-03-24 DIAGNOSIS — M216X9 Other acquired deformities of unspecified foot: Secondary | ICD-10-CM

## 2014-03-24 DIAGNOSIS — IMO0002 Reserved for concepts with insufficient information to code with codable children: Secondary | ICD-10-CM

## 2014-03-24 MED ORDER — OXYCODONE-ACETAMINOPHEN 5-325 MG PO TABS: 1.0000 | ORAL_TABLET | Freq: Three times a day (TID) | ORAL | Status: DC | PRN

## 2014-03-24 MED ORDER — TRAMADOL HCL 50 MG PO TABS
50.0000 mg | ORAL_TABLET | Freq: Three times a day (TID) | ORAL | Status: DC | PRN
Start: 2014-03-24 — End: 2014-04-29

## 2014-03-24 MED ORDER — BUTALBITAL-APAP-CAFFEINE 50-325-40 MG PO TABS: 1.0000 | ORAL_TABLET | Freq: Two times a day (BID) | ORAL | Status: DC | PRN

## 2014-03-24 MED ORDER — NORTRIPTYLINE HCL 50 MG PO CAPS: 100.0000 mg | ORAL_CAPSULE | Freq: Every day | ORAL | Status: DC

## 2014-03-24 MED ORDER — HYDROCHLOROTHIAZIDE 25 MG PO TABS: 25.0000 mg | ORAL_TABLET | Freq: Every day | ORAL | Status: DC

## 2014-03-24 NOTE — Assessment & Plan Note (Signed)
Stable/resolving Continue xarelto

## 2014-03-24 NOTE — Assessment & Plan Note (Signed)
Increasing nortriptyline from 50-100

## 2014-03-24 NOTE — Assessment & Plan Note (Signed)
Awaiting pt placement into pain mgt at [facility] Will refill max of percocet 5/325 TID and tramadol every 30 days until he can get into pain mgt.  Continue gabapentin

## 2014-03-24 NOTE — Assessment & Plan Note (Signed)
Elevated at last 2 visits Restart HCTZ

## 2014-03-24 NOTE — Patient Instructions (Signed)
Please increase your nortriptyline from 50-100 Restart your HCTZ Please continue your xarelto We are trying to get you in to see the neurosurgeon and the new pain management clinic.  Come back to see me in 4 weeks

## 2014-03-24 NOTE — Assessment & Plan Note (Signed)
Staff contacted neurosurge today to see if we can get him ASAP due to persistent foot drop and EMG results

## 2014-03-24 NOTE — Progress Notes (Signed)
[name] [name] is a 53 y.o. male who presents to [facility] today for Foot drop  L foot drop: not improved. Has not heard from neurosurge. Wearing brace w/ some benefit. Pt w/ inversion table at home and is wondering if he can try it.   Pain: primarily from lower spine to L toe. Using percocet 5/325 TID. And not taking tramadol. Taking gabapentin 900 TID.   Dep[ression: worsenin after death of mother. Unable to travel to be there for services and family gatherings.    HTN: off hctz since last hospitalization.   PE: taking xarelto daily. deneis SOB and CP.   Now smoking 1ppd. Due to increased pain and anxiety  The following portions of the patient's history were reviewed and updated as appropriate: allergies, current medications, past medical history, family and social history, and problem list.  Patient is a smoker  Past Medical History  Diagnosis Date  . Constipation due to pain medication   . COPD (chronic obstructive pulmonary disease)   . Status post spinal surgery   . Hypertension   . Anxiety   . PE (pulmonary embolism)   . Tobacco abuse     ROS as above otherwise neg.    Medications reviewed. Current Outpatient Prescriptions  Medication Sig Dispense Refill  . albuterol (PROVENTIL HFA;VENTOLIN HFA) 108 (90 BASE) MCG/ACT inhaler Inhale 2 puffs into the lungs every 4 (four) hours as needed for wheezing.      . butalbital-acetaminophen-caffeine (FIORICET, ESGIC) 50-325-40 MG per tablet Take 1-2 tablets by mouth 2 (two) times daily as needed for headache.  30 tablet  3  . cyclobenzaprine (FLEXERIL) 10 MG tablet TAKE ONE TABLET BY MOUTH THREE TIMES DAILY AS NEEDED FOR MUSCLE SPASM  90 tablet  3  . gabapentin (NEURONTIN) 300 MG capsule Take 900 mg by mouth 3 (three) times daily.      . hydrochlorothiazide (HYDRODIURIL) 25 MG tablet Take 1 tablet (25 mg total) by mouth daily.  30 tablet  0  . lisinopril (PRINIVIL,ZESTRIL) 20 MG tablet Take 1 tablet (20 mg total) by mouth daily.  30 tablet  0   . loratadine (CLARITIN) 10 MG tablet Take 1 tablet (10 mg total) by mouth daily.  30 tablet  0  . nortriptyline (PAMELOR) 50 MG capsule Take 2 capsules (100 mg total) by mouth at bedtime.  60 capsule  6  . oxyCODONE-acetaminophen (PERCOCET) 5-325 MG per tablet Take 1 tablet by mouth every 8 (eight) hours as needed for severe pain.  90 tablet  0  . rivaroxaban (XARELTO) 20 MG TABS tablet Take 1 tablet (20 mg total) by mouth daily with supper. Start taking on May 8  30 tablet  0  . Estill Dooms MISC Apply 1 application topically 2 (two) times daily as needed (leg pain).      . simvastatin (ZOCOR) 20 MG tablet Take 20 mg by mouth every evening.      . tiotropium (SPIRIVA) 18 MCG inhalation capsule Place 18 mcg into inhaler and inhale daily.      . traMADol (ULTRAM) 50 MG tablet Take 1-2 tablets (50-100 mg total) by mouth every 8 (eight) hours as needed.  60 tablet  0  . traZODone (DESYREL) 100 MG tablet Take 100 mg by mouth at bedtime.       No current facility-administered medications for this visit.    Exam:  BP 156/100  Pulse 102  Temp(Src) 98.4 F (36.9 C) (Oral) Gen: Well NAD HEENT: EOMI,  MMM Lungs: CTABL Nl  WOB Heart: RRR no MRG Abd: NABS, NT, ND Exts: Non edematous BL  LE, warm and well perfused.   No results found for this or any previous visit (from the past 72 hour(s)).  A/P (as seen in Problem list)  HTN (hypertension) Elevated at last 2 visits Restart HCTZ  Low back pain Awaiting pt placement into pain mgt at [facility] Will refill max of percocet 5/325 TID and tramadol every 30 days until he can get into pain mgt.  Continue gabapentin  Nerve pain Continue gabapentin  Saddle pulmonary embolus Stable/resolving Continue xarelto  Left foot drop Staff contacted neurosurge today to see if we can get him ASAP due to persistent foot drop and EMG results  Depression Increasing nortriptyline from 50-100   ]

## 2014-03-24 NOTE — Assessment & Plan Note (Signed)
Continue gabapentin.

## 2014-03-28 ENCOUNTER — Telehealth: Payer: Self-pay | Admitting: Family Medicine

## 2014-03-28 DIAGNOSIS — M21372 Foot drop, left foot: Secondary | ICD-10-CM

## 2014-03-28 NOTE — Telephone Encounter (Signed)
Pt called and wanted to know what the next steps are going to be with care for his leg. He wanted to know if Dr. Marily Memos has reviewed the report from the Neuro surgeon and if so what does he recommend they do. jw

## 2014-03-29 NOTE — Telephone Encounter (Signed)
Please call to f/u on referral to neurosurgeon. Referral was placed as urgent

## 2014-04-01 ENCOUNTER — Encounter: Payer: Self-pay | Admitting: Family Medicine

## 2014-04-01 ENCOUNTER — Other Ambulatory Visit: Payer: Self-pay | Admitting: Family Medicine

## 2014-04-01 NOTE — Telephone Encounter (Signed)
To marines. [name] [name]

## 2014-04-03 ENCOUNTER — Other Ambulatory Visit: Payer: Self-pay | Admitting: Family Medicine

## 2014-04-03 MED ORDER — FINASTERIDE 5 MG PO TABS: 5.0000 mg | ORAL_TABLET | Freq: Every day | ORAL | Status: DC

## 2014-04-03 NOTE — Telephone Encounter (Signed)
Please approve.  Already called in to pharmacy per your telephone encounter. Jazmin [name],CMA

## 2014-04-03 NOTE — Telephone Encounter (Signed)
Pt saw neurosurgery and stated no surgery needed. Needs to see neurology.   Staff to call to set up urgent visit to neuro (who recommended surgery)  Staff to also call in refill for lorazepam and to call pain clinic again to see where he is on the process of being seen  Linna Darner, MD Family Medicine PGY-3 04/03/2014, 1:55 PM

## 2014-04-03 NOTE — Telephone Encounter (Signed)
Pt had appt on Thu 03/27/14 with the Neuro surgeon Crist from neurosurgery office notified pt about appt.  It was processed on 05/19  [employer]

## 2014-04-03 NOTE — Telephone Encounter (Signed)
Pt needs a refill on his Lorazepam left up front for pickup. Please call him when ready since he has to call his girlfriend to pickup. jw

## 2014-04-03 NOTE — Telephone Encounter (Signed)
Will forward to MD to make him aware. Jazmin [name],CMA  

## 2014-04-04 NOTE — Telephone Encounter (Signed)
1.  Spoke with Pain clinic pt is in the review process and they will call him if he is approved or they will message [place] if he is not.  2. LMOVM of [name] to schedule @ [facility] called in Lorazepam yesterday.  4.  Pt informed of all the above. [name] [name]

## 2014-04-04 NOTE — Telephone Encounter (Signed)
Spoke with [name] [name], they will need a new referral since the last one was only for a nerve conduction study.  Will forward to MD. Laban Emperor Cristan Hout

## 2014-04-07 NOTE — Addendum Note (Signed)
Addended by: Waldemar [name] on: 04/07/2014 09:07 AM   Modules accepted: Orders

## 2014-04-07 NOTE — Telephone Encounter (Signed)
Referral placed for [facility].

## 2014-04-08 ENCOUNTER — Encounter (HOSPITAL_COMMUNITY): Payer: Self-pay | Admitting: Emergency Medicine

## 2014-04-08 ENCOUNTER — Emergency Department (HOSPITAL_COMMUNITY)
Admission: EM | Admit: 2014-04-08 | Discharge: 2014-04-08 | Disposition: A | Discharge status: Home / Self Care | Payer: Medicaid Other | Attending: Emergency Medicine | Admitting: Emergency Medicine

## 2014-04-08 ENCOUNTER — Emergency Department (HOSPITAL_COMMUNITY): Payer: Medicaid Other

## 2014-04-08 DIAGNOSIS — M14679 Charcot's joint, unspecified ankle and foot: Secondary | ICD-10-CM

## 2014-04-08 DIAGNOSIS — F411 Generalized anxiety disorder: Secondary | ICD-10-CM | POA: Insufficient documentation

## 2014-04-08 DIAGNOSIS — J4489 Other specified chronic obstructive pulmonary disease: Secondary | ICD-10-CM | POA: Insufficient documentation

## 2014-04-08 DIAGNOSIS — Z8719 Personal history of other diseases of the digestive system: Secondary | ICD-10-CM | POA: Insufficient documentation

## 2014-04-08 DIAGNOSIS — A5211 Tabes dorsalis: Secondary | ICD-10-CM | POA: Insufficient documentation

## 2014-04-08 DIAGNOSIS — J449 Chronic obstructive pulmonary disease, unspecified: Secondary | ICD-10-CM | POA: Insufficient documentation

## 2014-04-08 DIAGNOSIS — Z79899 Other long term (current) drug therapy: Secondary | ICD-10-CM | POA: Insufficient documentation

## 2014-04-08 DIAGNOSIS — F17200 Nicotine dependence, unspecified, uncomplicated: Secondary | ICD-10-CM | POA: Insufficient documentation

## 2014-04-08 DIAGNOSIS — I10 Essential (primary) hypertension: Secondary | ICD-10-CM | POA: Insufficient documentation

## 2014-04-08 DIAGNOSIS — Z7901 Long term (current) use of anticoagulants: Secondary | ICD-10-CM | POA: Insufficient documentation

## 2014-04-08 DIAGNOSIS — Z86711 Personal history of pulmonary embolism: Secondary | ICD-10-CM | POA: Insufficient documentation

## 2014-04-08 MED ORDER — OXYCODONE-ACETAMINOPHEN 5-325 MG PO TABS
1.0000 | ORAL_TABLET | Freq: Once | ORAL | Status: AC
  Administered 2014-04-08: 1 via ORAL
  Filled 2014-04-08: qty 1

## 2014-04-08 MED ORDER — OXYCODONE-ACETAMINOPHEN 5-325 MG PO TABS: 2.0000 | ORAL_TABLET | Freq: Three times a day (TID) | ORAL | Status: DC | PRN

## 2014-04-08 NOTE — ED Notes (Signed)
Per pt sts he possibly injured his left foot last night. 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  he said possibly last night his foot was caught in the covers. woke up this am with swelling and pain in left foot in ankle

## 2014-04-08 NOTE — ED Notes (Signed)
Dr Allen at bedside  

## 2014-04-08 NOTE — ED Provider Notes (Signed)
CSN: [id number]     Arrival date & time 04/08/14  1346 History   None    Chief Complaint  Patient presents with  . Foot Swelling     (Consider location/radiation/quality/duration/timing/severity/associated sxs/prior Treatment) Patient is a 53 y.o. male presenting with lower extremity pain. The history is provided by the patient.  Foot Pain This is a new problem. The current episode started today. The problem occurs constantly. Associated symptoms include arthralgias and joint swelling. Associated symptoms comments: Neuropathy of the left lower extremity, swelling of the ankle and foot, no frank traumatic injury.. Nothing aggravates the symptoms. He has tried nothing for the symptoms. The treatment provided no relief.    Past Medical History  Diagnosis Date  . Constipation due to pain medication   . COPD (chronic obstructive pulmonary disease)   . Status post spinal surgery   . Hypertension   . Anxiety   . PE (pulmonary embolism)   . Tobacco abuse    Past Surgical History  Procedure Laterality Date  . Spinal fixation surgery     Family History  Problem Relation Age of Onset  . Cancer Neg Hx    History  Substance Use Topics  . Smoking status: Current Every [place] Smoker -- 1.00 packs/[place]    Types: Cigarettes  . Smokeless tobacco: Not on file     Comment: decreased to 1ppd  . Alcohol Use: Not on file    Review of Systems  Musculoskeletal: Positive for arthralgias and joint swelling.  All other systems reviewed and are negative.     Allergies  Fentanyl and Methadone  Home Medications   Prior to Admission medications   Medication Sig Start Date End Date Taking? Authorizing Provider  albuterol (PROVENTIL HFA;VENTOLIN HFA) 108 (90 BASE) MCG/ACT inhaler Inhale 2 puffs into the lungs every 4 (four) hours as needed for wheezing.   Yes Historical Provider, MD  butalbital-acetaminophen-caffeine (FIORICET, ESGIC) [phone] MG per tablet Take 1-2 tablets by mouth 2 (two) times  daily as needed for headache. 03/24/14  Yes Waldemar [name], MD  cyclobenzaprine (FLEXERIL) 10 MG tablet TAKE ONE TABLET BY MOUTH THREE TIMES DAILY AS NEEDED FOR MUSCLE SPASM 02/28/14  Yes Waldemar [name], MD  finasteride (PROSCAR) 5 MG tablet Take 1 tablet (5 mg total) by mouth daily. 04/03/14  Yes Waldemar [name], MD  gabapentin (NEURONTIN) 300 MG capsule Take 900 mg by mouth 3 (three) times daily.   Yes Historical Provider, MD  hydrochlorothiazide (HYDRODIURIL) 25 MG tablet Take 1 tablet (25 mg total) by mouth daily. 03/24/14  Yes Waldemar [name], MD  lisinopril (PRINIVIL,ZESTRIL) 20 MG tablet Take 1 tablet (20 mg total) by mouth daily.   Yes Waldemar [name], MD  loratadine (CLARITIN) 10 MG tablet Take 1 tablet (10 mg total) by mouth daily. 02/22/14  Yes Dayarmys [place] de Gwendalyn Ege, MD  LORazepam (ATIVAN) 2 MG tablet TAKE 1 TABLET BY MOUTH TWICE DAILY AS NEEDED   Yes Willeen Niece, MD  nortriptyline (PAMELOR) 50 MG capsule Take 2 capsules (100 mg total) by mouth at bedtime. 03/24/14  Yes Waldemar [name], MD  oxyCODONE-acetaminophen (PERCOCET) 5-325 MG per tablet Take 1 tablet by mouth every 8 (eight) hours as needed for severe pain. 03/24/14  Yes Waldemar [name], MD  rivaroxaban (XARELTO) 20 MG TABS tablet Take 1 tablet (20 mg total) by mouth daily with supper. Start taking on May 8   Yes Waldemar [name], MD  simvastatin (ZOCOR) 20 MG tablet Take  20 mg by mouth every evening.   Yes Historical Provider, MD  tiotropium (SPIRIVA) 18 MCG inhalation capsule Place 18 mcg into inhaler and inhale daily.   Yes Historical Provider, MD  traMADol (ULTRAM) 50 MG tablet Take 1-2 tablets (50-100 mg total) by mouth every 8 (eight) hours as needed. 03/24/14  Yes Waldemar [name], MD  traZODone (DESYREL) 100 MG tablet Take 100 mg by mouth at bedtime.   Yes Historical Provider, MD  Estill Dooms MISC Apply 1 application topically 2 (two) times daily as needed (leg pain).    Historical Provider, MD   BP 148/80  Pulse 98   Temp(Src) 97.8 F (36.6 C) (Oral)  Resp 18  SpO2 99% Physical Exam  Constitutional: He is oriented to person, place, and time. He appears well-developed and well-nourished. No distress.  HENT:  Head: Normocephalic and atraumatic.  Eyes: Conjunctivae are normal.  Neck: Neck supple. No tracheal deviation present.  Cardiovascular: Normal rate and regular rhythm.   Pulmonary/Chest: Effort normal. No respiratory distress.  Abdominal: Soft. He exhibits no distension.  Musculoskeletal:       Left ankle: He exhibits decreased range of motion (unable to move since initial sciatic nerve difficulty started in all of lower extremity). Tenderness. Medial malleolus tenderness found.  Neurological: He is alert and oriented to person, place, and time. GCS eye subscore is 4. GCS verbal subscore is 5. GCS motor subscore is 6.  Skin: Skin is warm and dry.  Psychiatric: He has a normal mood and affect.    ED Course  Procedures (including critical care time) Labs Review Labs Reviewed - No data to display  Imaging Review Dg Ankle Complete Left  04/08/2014   EXAM: LEFT ANKLE COMPLETE - 3+ VIEW  COMPARISON:  None.  FINDINGS: Subtle avulsion fracture from the medial malleolus. This appears corticated and may represent an old fracture. A subtle nondisplaced fracture of the distal tip of the lateral malleolus cannot be completely excluded. Posterior malleolus is intact.  IMPRESSION: 1. Subtle avulsion fracture from the medial malleolus. This may be old as the tiny fracture fragment appears corticated.  2. A subtle nondisplaced fracture of the lateral malleolar tip cannot be excluded.   Electronically Signed   By: [place]   On: 04/08/2014 16:19   Dg Foot Complete Left  04/08/2014   CLINICAL DATA:  Foot numbness.  EXAM: LEFT FOOT - COMPLETE 3+ VIEW  COMPARISON:  None.  FINDINGS: Diffuse mild degenerative change noted. Degenerative changes most prominent at base of the first metatarsal. A subtle fracture chip  arising from the anterior aspect of the cuboid cannot be excluded. Mild calcaneal spurring noted.  IMPRESSION: 1. Subtle fracture arising from the anterior aspect of the cuboid cannot be excluded. No acute abnormality otherwise noted.  2. DJD .   Electronically Signed   By: Marcello Moores  Register   On: 04/08/2014 15:12     EKG Interpretation None      MDM   Final diagnoses:  Charcot ankle    53 y.o. male presents with left ankle and foot pain that he first noticed waking up this morning. 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 The patient has had a recent EMG and nerve conduction study that showed that the nerves in his legs so essentially no response. He has been referred from neurology to neurosurgery and back to neurology and has had no treatment plan offered. He has been wearing a boot to stabilize  the foot to walk on it most of the time but still a blister on his house carefully without putting anything on it.  The patient has multiple subtle radiographic findings that are concerning for arthropathy. 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 I requested that the patient not place any weight on his foot for the foreseeable future until resolution of the swelling and erythema of his joint and be cleared by his doctor as recommended by guidelines set forward from research conducted on diabetic patient with a similar problem. Up-to-date summary recommendations and treatment were posted into the patient's discharge information to assist his understanding and said he may demonstrated to his primary care provider on followup. Patient started anticoagulated, has no signs of venous thrombosis or extension currently, hopefully his  arthropathy was recognized early enough to intervene and prevent loss of function and progressive arthropathy of the ankle and foot.  Leo Grosser, MD 04/09/14 [phone]

## 2014-04-08 NOTE — ED Provider Notes (Signed)
I saw and evaluated the patient, reviewed the resident's note and I agree with the findings and plan.   EKG Interpretation None     Patient here with left foot and ankle pain that is worse with walking. No evidence of calf swelling. Suspect this is musculoskeletal as patient normally uses a brace and does have a history of foot drop. We'll check x-rays  Leota Jacobsen, MD 04/08/14 [phone]

## 2014-04-08 NOTE — ED Notes (Signed)
Report given to Rolene Arbour, RN. Pt currently in xray. Called xray and told to return pt to D32.

## 2014-04-08 NOTE — ED Notes (Signed)
Charge nurse notified of change of acuity.

## 2014-04-08 NOTE — Discharge Instructions (Signed)
TREATMENT -- The response to therapy depends upon the stage at which the condition is diagnosed. A patient with a warm, swollen, erythematous foot of relatively recent onset may respond to therapy, while patients presenting with chronic joint deformities may have already progressed to irreversible joint disintegration and damage.  Classification of Charcot arthropathy into one of four stages by a modified Eichenholtz system is helpful for considering appropriate treatment and prognosis and for clinical trials [18]. The stages are:  ?Stage 0: Early or inflammatory - There is localized swelling, erythema, and warmth with little or no radiological abnormalities. ?Stage 1: Development - Swelling, redness, and warmth persist, and bony changes such as fracture, subluxation dislocation, and bony debris start to appear radiologically. ?Stage 2: Coalescence - The clinical inflammatory signs decrease, and radiological signs of fracture healing, bony debris resorption, and new bone formation occur. ?Stage 3: Remodelling - The redness, warmth, and swelling has settled, and bony deformity which may be stable or unstable is present. Radiographic appearances may show mature fracture callus and decreased sclerosis. If the diagnosis is made at Stage 0 before significant radiological damage has occurred, then successful treatment is much more likely.  Acute therapy -- In the earlier stages of neuropathic arthropathy, in which edema, redness, and warmth are present, avoidance of weightbearing on the affected joint should be recommended until resolution of the edema and erythema occurs, with improvement of radiologic signs (eg, resolving resorptive changes, resorption of osseous debris, and evidence of repair). A minimum of eight weeks without weightbearing has been recommended for disease of the midfoot, progressing through partial weightbearing in a cast brace to full weightbearing in about four to five months [1]. Good  chiropody and well-fitting shoes are essential at this stage  Diabetes is the most common cause of neuropathic (Charcot) arthropathy in the western [place]. The pathogenesis of this condition is uncertain, but it is probably due to a combination of mechanical and vascular factors resulting from diabetic peripheral neuropathy. A major hypothesis is that lack of proprioception secondary to peripheral neuropathy may result in ligamentous laxity, increased range of joint movement, instability, and damage by minor trauma, to which the relatively insensitive neuropathic foot is prone.  ?Diabetic neuropathic joint disease most commonly affects the joints of the foot and ankle. It typically affects patients with longstanding diabetes and peripheral neuropathy and occurs in both type 1 and type 2 diabetes. (See 'Clinical features' above.) ?The clinical presentation varies. Patients may present with recent onset of unilateral warmth, redness, and edema over foot or ankle, often with a history of minor trauma, and occasionally recurrent acute attacks may occur. A minority may have bilateral involvement. Alternatively, there can be a slowly progressing arthropathy with insidious swelling over months or years. Neuroarthropathy of the foot may involve collapse of the arch of the midfoot, leading to bony prominences on the plantar aspect with later pressure ulceration (picture 1). Although the majority of patients experience pain, the severity is less than might be expected. ?Early diagnosis is essential to give the best clinical outcome, and clinicians should have a high index of suspicion for this condition when a diabetic patient presents with a swollen foot.  ?Diagnostic evaluation is aimed at confirming the diagnosis by identification of characteristic clinical and radiographic findings and by the exclusion of other conditions, including infection, which could be confused with neuropathic arthropathy. In patients with a  joint effusion, we perform synovial fluid analysis to help rule out other conditions, including septic or crystalline arthropathy. Radiographic findings  are variable, depending upon the stage and site of the arthropathy. Magnetic resonance imaging (MRI) may be helpful in some patients, but use of contrast is relatively contraindicated in patients with moderate or greater renal impairment. ?Important considerations in the differential diagnosis of diabetic neuropathic arthropathy include septic arthritis, cellulitis, gout, calcium pyrophosphate arthropathy, osteoarthritis (OA), idiopathic inflammatory diseases, osteomyelitis, and complex regional pain syndrome. (See 'Differential diagnosis' above and 'Septic arthritis' above and 'Crystal-associated arthritis' above and 'Osteoarthritis' above and 'Inflammatory arthritides' above and 'Complex regional pain syndrome' above.) ?In the earlier stages of neuropathic arthropathy, the avoidance of weightbearing on the affected joint should be recommended until resolution of edema and erythema occurs, with improvement of radiologic signs. Many consider the total contact cast to be the treatment of choice, with or without an initial period of non-weightbearing immobilization. There is limited evidence for benefit with oral alendronate or with intravenous pamidronate treatment, and there is no evidence of benefit for treatment with zoledronic acid. Bisphosphonate treatment should not be considered as a substitute for offloading the affected limb. (See 'Acute therapy' above.) ?In patients who present later in disease, joint disorganization is often severe and irreversible. Disease of the hindfoot and ankle appears to have a worse prognosis than disease of the midfoot. Deformities are common and can transfer weightbearing to areas that tolerate it poorly and or that may lack sensation. Ulceration and infection commonly ensue. (See 'Joint disorganization and surgical correction'  above.) ?The goal of treatment in these patients is to maintain a stable plantigrade foot that is free of ulceration and infection; this can be difficult to achieve, even with good podiatry and with the use of specialized footwear and orthoses. (See 'Treatment' above.) ?Surgical correction is best avoided in most patients. Surgical correction of skeletal deformity following nonunion of a fracture in a neuropathic foot has a relatively high failure rate (over one-quarter of patients), mainly in patients who have active ulceration relating to the deformity at the time of operation; thus, surgery should be postponed until the ulcers have healed. (

## 2014-04-08 NOTE — ED Notes (Signed)
Pt with complex hx c/o LLE swelling and pain on waking this am.  Acuity changed to 3 due to swelling and  Hx of blood clots.

## 2014-04-08 NOTE — ED Notes (Signed)
Pt was apparently in the restroom, not in xray. Taken to D32.

## 2014-04-11 ENCOUNTER — Other Ambulatory Visit: Payer: Self-pay | Admitting: Family Medicine

## 2014-04-11 NOTE — ED Provider Notes (Signed)
I saw and evaluated the patient, reviewed the resident's note and I agree with the findings and plan.   EKG Interpretation None       Leota Jacobsen, MD 04/11/14 1547

## 2014-04-13 ENCOUNTER — Other Ambulatory Visit: Payer: Self-pay | Admitting: Family Medicine

## 2014-04-14 ENCOUNTER — Encounter: Payer: Self-pay | Admitting: Neurology

## 2014-04-14 ENCOUNTER — Ambulatory Visit (INDEPENDENT_AMBULATORY_CARE_PROVIDER_SITE_OTHER): Payer: Medicaid Other | Admitting: Neurology

## 2014-04-14 VITALS — BP 118/83 | HR 107 | Ht 70.0 in | Wt 242.0 lb

## 2014-04-14 DIAGNOSIS — G5700 Lesion of sciatic nerve, unspecified lower limb: Secondary | ICD-10-CM

## 2014-04-14 DIAGNOSIS — M545 Low back pain, unspecified: Secondary | ICD-10-CM

## 2014-04-14 DIAGNOSIS — M216X9 Other acquired deformities of unspecified foot: Secondary | ICD-10-CM

## 2014-04-14 DIAGNOSIS — G5702 Lesion of sciatic nerve, left lower limb: Secondary | ICD-10-CM | POA: Insufficient documentation

## 2014-04-14 DIAGNOSIS — M21372 Foot drop, left foot: Secondary | ICD-10-CM

## 2014-04-14 HISTORY — DX: Lesion of sciatic nerve, left lower limb: G57.02

## 2014-04-14 MED ORDER — OXCARBAZEPINE 150 MG PO TABS: ORAL_TABLET | ORAL | Status: DC

## 2014-04-14 NOTE — Patient Instructions (Signed)
Sciatica °Sciatica is pain, weakness, numbness, or tingling along the path of the sciatic nerve. The nerve starts in the lower back and runs down the back of each leg. The nerve controls the muscles in the lower leg and in the back of the knee, while also providing sensation to the back of the thigh, lower leg, and the sole of your foot. Sciatica is a symptom of another medical condition. For instance, nerve damage or certain conditions, such as a herniated disk or bone spur on the spine, pinch or put pressure on the sciatic nerve. This causes the pain, weakness, or other sensations normally associated with sciatica. Generally, sciatica only affects one side of the body. °CAUSES  °· Herniated or slipped disc. °· Degenerative disk disease. °· A pain disorder involving the narrow muscle in the buttocks (piriformis syndrome). °· Pelvic injury or fracture. °· Pregnancy. °· Tumor (rare). °SYMPTOMS  °Symptoms can vary from mild to very severe. The symptoms usually travel from the low back to the buttocks and down the back of the leg. Symptoms can include: °· Mild tingling or dull aches in the lower back, leg, or hip. °· Numbness in the back of the calf or sole of the foot. °· Burning sensations in the lower back, leg, or hip. °· Sharp pains in the lower back, leg, or hip. °· Leg weakness. °· Severe back pain inhibiting movement. °These symptoms may get worse with coughing, sneezing, laughing, or prolonged sitting or standing. Also, being overweight may worsen symptoms. °DIAGNOSIS  °Your caregiver will perform a physical exam to look for common symptoms of sciatica. He or she may ask you to do certain movements or activities that would trigger sciatic nerve pain. Other tests may be performed to find the cause of the sciatica. These may include: °· Blood tests. °· X-rays. °· Imaging tests, such as an MRI or CT scan. °TREATMENT  °Treatment is directed at the cause of the sciatic pain. Sometimes, treatment is not necessary  and the pain and discomfort goes away on its own. If treatment is needed, your caregiver may suggest: °· Over-the-counter medicines to relieve pain. °· Prescription medicines, such as anti-inflammatory medicine, muscle relaxants, or narcotics. °· Applying heat or ice to the painful area. °· Steroid injections to lessen pain, irritation, and inflammation around the nerve. °· Reducing activity during periods of pain. °· Exercising and stretching to strengthen your abdomen and improve flexibility of your spine. Your caregiver may suggest losing weight if the extra weight makes the back pain worse. °· Physical therapy. °· Surgery to eliminate what is pressing or pinching the nerve, such as a bone spur or part of a herniated disk. °HOME CARE INSTRUCTIONS  °· Only take over-the-counter or prescription medicines for pain or discomfort as directed by your caregiver. °· Apply ice to the affected area for 20 minutes, 3 4 times a [place] for the first 48 72 hours. Then try heat in the same way. °· Exercise, stretch, or perform your usual activities if these do not aggravate your pain. °· Attend physical therapy sessions as directed by your caregiver. °· Keep all follow-up appointments as directed by your caregiver. °· Do not wear high heels or shoes that do not provide proper support. °· Check your mattress to see if it is too soft. A firm mattress may lessen your pain and discomfort. °SEEK IMMEDIATE MEDICAL CARE IF:  °· You lose control of your bowel or bladder (incontinence). °· You have increasing weakness in the lower back,   pelvis, buttocks, or legs. °· You have redness or swelling of your back. °· You have a burning sensation when you urinate. °· You have pain that gets worse when you lie down or awakens you at night. °· Your pain is worse than you have experienced in the past. °· Your pain is lasting longer than 4 weeks. °· You are suddenly losing weight without reason. °MAKE SURE YOU: °· Understand these  instructions. °· Will watch your condition. °· Will get help right away if you are not doing well or get worse. °Document Released: 10/18/2001 Document Revised: 04/24/2012 Document Reviewed: 03/04/2012 °[employer]® Patient Information ©2014 [employer]. ° °

## 2014-04-14 NOTE — Progress Notes (Signed)
Reason for visit: Left leg pain and weakness  [name] [name] is a 53 y.o. male  History of present illness:  [name] [name] is a 53 year old right-handed white male with a history of a fall that occurred on 02/14/2014. The patient was at home at the time, and got up from a desk, and apparently fell backwards. The patient struck the back of his head, and he was unconscious on the ground for an undetermined period of time. His wife found him when she came home from work that [place]. He went to emergency room, and he had a CT scan of the brain that was unremarkable. MRI of the lumbosacral spine eventually was done as he was complaining of back pain, and pain down the left leg with associated weakness. No definite nerve root compression was seen on the left side. The patient has been sent for EMG evaluation that showed evidence of a left sciatic neuropathy without evidence of a lumbosacral radiculopathy. The patient indicates that he was not sedated from medications or alcohol at the time of the event. The patient continues to have electric shock pains into the left leg, and he has pain in the left buttock, posterior thigh and leg, with severe weakness with the hamstring muscles and with all muscles below the knee on the left. The patient has similar pain on the right side, but no weakness. The pain on the right leg is much less severe. Currently, the patient takes Percocet without benefit and he is on gabapentin taking 900 mg 3 times daily. The patient indicates that a heating pad on the left buttocks will help some of the discomfort. He has not undergone MRI evaluation of the pelvis. He has had a DVT of the left leg with a pulmonary embolism, and he is on Xarelto currently. He is sent to this office for an evaluation. The patient denies problems controlling the bowels or the bladder. He does have some chronic neck pain, and he has some tingling in the hands that is chronic in nature. He has a prior history of cervical  spine surgery.  Past Medical History  Diagnosis Date  . Constipation due to pain medication   . COPD (chronic obstructive pulmonary disease)   . Status post spinal surgery   . Hypertension   . Anxiety   . PE (pulmonary embolism)     DVT left leg  . Tobacco abuse   . 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 Migraine     Past Surgical History  Procedure Laterality Date  . Spinal fixation surgery      cervical    Family History  Problem Relation Age of Onset  . Cancer Mother   . Depression Sister     Social history:  reports that he has been smoking Cigarettes.  He has been smoking about 1.00 pack per [place]. He has never used smokeless tobacco. He reports that he does not drink alcohol or use illicit drugs.  Medications:  Current Outpatient Prescriptions on File Prior to Visit  Medication Sig Dispense Refill  . albuterol (PROVENTIL HFA;VENTOLIN HFA) 108 (90 BASE) MCG/ACT inhaler Inhale 2 puffs into the lungs every 4 (four) hours as needed for wheezing.      . butalbital-acetaminophen-caffeine (FIORICET, ESGIC) 50-325-40 MG per tablet Take 1-2 tablets by mouth 2 (two) times daily as needed for headache.  30 tablet  3  . cyclobenzaprine (FLEXERIL) 10 MG tablet TAKE ONE TABLET BY MOUTH THREE  TIMES DAILY AS NEEDED FOR MUSCLE SPASM  90 tablet  3  . finasteride (PROSCAR) 5 MG tablet Take 1 tablet (5 mg total) by mouth daily.  30 tablet  11  . gabapentin (NEURONTIN) 300 MG capsule Take 900 mg by mouth 3 (three) times daily.      . hydrochlorothiazide (HYDRODIURIL) 25 MG tablet Take 1 tablet (25 mg total) by mouth daily.  30 tablet  0  . lisinopril (PRINIVIL,ZESTRIL) 20 MG tablet Take 1 tablet (20 mg total) by mouth daily.  30 tablet  0  . loratadine (CLARITIN) 10 MG tablet Take 1 tablet (10 mg total) by mouth daily.  30 tablet  0  . LORazepam (ATIVAN) 2 MG tablet TAKE 1 TABLET BY MOUTH TWICE DAILY AS NEEDED  60 tablet  0  . nortriptyline (PAMELOR) 50 MG capsule  Take 2 capsules (100 mg total) by mouth at bedtime.  60 capsule  6  . oxyCODONE-acetaminophen (PERCOCET) 5-325 MG per tablet Take 1 tablet by mouth every 8 (eight) hours as needed for severe pain.  90 tablet  0  . rivaroxaban (XARELTO) 20 MG TABS tablet Take 1 tablet (20 mg total) by mouth daily with supper. Start taking on May 8  30 tablet  0  . Estill Dooms MISC Apply 1 application topically 2 (two) times daily as needed (leg pain).      . simvastatin (ZOCOR) 20 MG tablet Take 20 mg by mouth every evening.      . tiotropium (SPIRIVA) 18 MCG inhalation capsule Place 18 mcg into inhaler and inhale daily.      . traMADol (ULTRAM) 50 MG tablet Take 1-2 tablets (50-100 mg total) by mouth every 8 (eight) hours as needed.  60 tablet  0  . traZODone (DESYREL) 100 MG tablet Take 100 mg by mouth at bedtime.       No current facility-administered medications on file prior to visit.      Allergies  Allergen Reactions  . Fentanyl     Per pt he had a near overdose on these due to "regulating body temp"  . Methadone Itching    ROS:  Out of a complete 14 system review of symptoms, the patient complains only of the following symptoms, and all other reviewed systems are negative.  Blurred vision Shortness of breath, cough, snoring Diarrhea Headache, numbness Depression, anxiety, insomnia, disinterest in activities  Blood pressure 118/83, pulse 107, height 5\' 10"  (1.778 m), weight 242 lb (109.77 kg).  Physical Exam  General: The patient is alert and cooperative at the time of the examination.  Eyes: Pupils are equal, round, and reactive to light. Discs are flat bilaterally.  Neck: The neck is supple, no carotid bruits are noted.  Respiratory: The respiratory examination is clear.  Cardiovascular: The cardiovascular examination reveals a regular rate and rhythm, no obvious murmurs or rubs are noted.  Skin: Extremities are without significant edema.  Neurologic Exam  Mental status: The  patient is alert and oriented x 3 at the time of the examination. The patient has apparent normal recent and remote memory, with an apparently normal attention span and concentration ability.  Cranial nerves: Facial symmetry is present. There is good sensation of the face to pinprick and soft touch bilaterally. The strength of the facial muscles and the muscles to head turning and shoulder shrug are normal bilaterally. Speech is well enunciated, no aphasia or dysarthria is noted. Extraocular movements are full. Visual fields are full. The tongue is midline, and  the patient has symmetric elevation of the soft palate. No obvious hearing deficits are noted.  Motor: The motor testing reveals 5 over 5 strength of all 4 extremities. Good symmetric motor tone is noted throughout.  Sensory: Sensory testing is intact to pinprick, soft touch, vibration sensation, and position sense on all 4 extremities. No evidence of extinction is noted.  Coordination: Cerebellar testing reveals good finger-nose-finger and heel-to-shin bilaterally.  Gait and station: Gait is normal. Tandem gait is normal. Romberg is negative. No drift is seen.  Reflexes: Deep tendon reflexes are symmetric and normal bilaterally. Toes are downgoing bilaterally.   MRI lumbar spine 02/14/2014:  IMPRESSION:  No significant change from the prior examination.  Summary of pertinent findings includes:  T12-L1 shallow left posterior lateral protrusion. Slight indentation  left lateral aspect of the thecal sac. Minimal crowding of adjacent  nerve roots.  L4-5 bulge which centrally touches but does not cause significant  compression of the thecal sac. Left foraminal/lateral disc  osteophyte with slight encroachment upon but not significant  compression of the exiting left L4 nerve root.  L5-S1 broad-based disc osteophyte complex. Centrally no compression  of the S1 nerve roots or thecal sac. Laterally, extension greater on  the right with  mild encroachment upon the exiting right L5 nerve  root.     Assessment/Plan:  1. Left sciatic neuropathy  The patient has sustained a significant left-sided sciatic neuropathy likely related to prolonged compression at the sciatic notch. Generally, this scenario occurs when someone is heavily sedated, unable to wake up. It is possible that the bump to the head resulted in a prolonged period of loss of consciousness. The patient will be sent for MRI of the pelvis. If this shows no ongoing compression of the sciatic nerve, the treatment is to alleviate pressure at the sciatic notch as much is possible, and allow for time for healing. Full healing may take 18 months or more. The patient will be given Trileptal to help with some of the neuropathic pain in combination with the gabapentin. I would like to avoid potentially addictive medication such as oxycodone. The patient indicates that he has undergone a brief session of physical therapy, and he has an AFO brace for the left foot. He will followup through this office in 4 months.  Jill Alexanders MD 04/14/2014 9:03 PM  [facility] [address] [place], [place] [address]  Phone [phone] Fax [phone]

## 2014-04-16 ENCOUNTER — Telehealth: Payer: Self-pay | Admitting: Neurology

## 2014-04-16 NOTE — Telephone Encounter (Signed)
The patient is on trileptal at 150 mg bid. He is having nausea on this dose. He will cut the tablet in half and take this bid with food. After one week, he will go back to 150 mg bid. He is to call if he cannot tolerate.

## 2014-04-20 ENCOUNTER — Other Ambulatory Visit: Payer: Self-pay | Admitting: Family Medicine

## 2014-04-22 ENCOUNTER — Ambulatory Visit
Admission: RE | Admit: 2014-04-22 | Discharge: 2014-04-22 | Disposition: A | Discharge status: Home / Self Care | Payer: Medicaid Other | Source: Ambulatory Visit | Attending: Neurology | Admitting: Neurology

## 2014-04-22 DIAGNOSIS — G5702 Lesion of sciatic nerve, left lower limb: Secondary | ICD-10-CM

## 2014-04-22 DIAGNOSIS — M21372 Foot drop, left foot: Secondary | ICD-10-CM

## 2014-04-22 DIAGNOSIS — M545 Low back pain, unspecified: Secondary | ICD-10-CM

## 2014-04-23 ENCOUNTER — Telehealth: Payer: Self-pay | Admitting: Neurology

## 2014-04-23 NOTE — Telephone Encounter (Signed)
I called the patient. The MRI the pelvis is unremarkable. The patient is to continue to avoid pressure on her buttocks area, and allow for time for healing of the sciatic nerve. No evidence of compression of the sciatic nerve.

## 2014-04-24 ENCOUNTER — Ambulatory Visit: Payer: Self-pay | Admitting: Family Medicine

## 2014-04-25 ENCOUNTER — Other Ambulatory Visit: Payer: Self-pay | Admitting: Family Medicine

## 2014-04-25 ENCOUNTER — Telehealth: Payer: Self-pay | Admitting: Family Medicine

## 2014-04-29 ENCOUNTER — Encounter: Payer: Self-pay | Admitting: Family Medicine

## 2014-04-29 ENCOUNTER — Ambulatory Visit (INDEPENDENT_AMBULATORY_CARE_PROVIDER_SITE_OTHER): Payer: Medicaid Other | Admitting: Family Medicine

## 2014-04-29 VITALS — BP 126/82 | HR 114 | Temp 98.2°F | Ht 70.0 in | Wt 232.0 lb

## 2014-04-29 DIAGNOSIS — M545 Low back pain, unspecified: Secondary | ICD-10-CM

## 2014-04-29 DIAGNOSIS — I10 Essential (primary) hypertension: Secondary | ICD-10-CM

## 2014-04-29 DIAGNOSIS — I2692 Saddle embolus of pulmonary artery without acute cor pulmonale: Secondary | ICD-10-CM

## 2014-04-29 DIAGNOSIS — M5430 Sciatica, unspecified side: Secondary | ICD-10-CM

## 2014-04-29 DIAGNOSIS — M21372 Foot drop, left foot: Secondary | ICD-10-CM

## 2014-04-29 DIAGNOSIS — B353 Tinea pedis: Secondary | ICD-10-CM

## 2014-04-29 DIAGNOSIS — M5440 Lumbago with sciatica, unspecified side: Secondary | ICD-10-CM

## 2014-04-29 DIAGNOSIS — M216X9 Other acquired deformities of unspecified foot: Secondary | ICD-10-CM

## 2014-04-29 MED ORDER — TRAMADOL HCL 50 MG PO TABS: 50.0000 mg | ORAL_TABLET | Freq: Three times a day (TID) | ORAL | Status: DC | PRN

## 2014-04-29 MED ORDER — OXYCODONE-ACETAMINOPHEN 5-325 MG PO TABS: 1.0000 | ORAL_TABLET | ORAL | Status: DC | PRN

## 2014-04-29 MED ORDER — FLUCONAZOLE 150 MG PO TABS: ORAL_TABLET | ORAL | Status: DC

## 2014-04-29 MED ORDER — OXYCODONE-ACETAMINOPHEN 5-325 MG PO TABS: 1.0000 | ORAL_TABLET | Freq: Three times a day (TID) | ORAL | Status: DC | PRN

## 2014-04-29 NOTE — Progress Notes (Addendum)
[name] [name] is a 53 y.o. male who presents to [facility] today for f/u  [facility] pain clinic spoke to pt and told him that he could not come to their clinic and that he would need to speak to me about why.   Back pain: seeing Neurosurgery/neurology. No surgery at this point. Reviewed records from previous notes. Pt started on trileptal. No change in pain. Slowly improving neuropathy. Taking neurontin as prescribed. Taking tramadol and percocet w/ some improvement but still in significant pain.   Foot pealing: oingoing for months. Fowl odor. Lotion w/o benefit. Nail in in grown and changing in color for years. Getting worse. W/ neuropathy in toes.   PE/DVT: on xarelto. No further CP, SOB. LE swellign resolving  The following portions of the patient's history were reviewed and updated as appropriate: allergies, current medications, past medical history, family and social history, and problem list.  Patient is a smoker  Past Medical History  Diagnosis Date  . Constipation due to pain medication   . COPD (chronic obstructive pulmonary disease)   . Status post spinal surgery   . Hypertension   . Anxiety   . PE (pulmonary embolism)     DVT left leg  . Tobacco abuse   . 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 Migraine     ROS as above otherwise neg.    Medications reviewed. Current Outpatient Prescriptions  Medication Sig Dispense Refill  . albuterol (PROVENTIL HFA;VENTOLIN HFA) 108 (90 BASE) MCG/ACT inhaler Inhale 2 puffs into the lungs every 4 (four) hours as needed for wheezing.      . butalbital-acetaminophen-caffeine (FIORICET, ESGIC) 50-325-40 MG per tablet Take 1-2 tablets by mouth 2 (two) times daily as needed for headache.  30 tablet  3  . cyclobenzaprine (FLEXERIL) 10 MG tablet TAKE ONE TABLET BY MOUTH THREE TIMES DAILY AS NEEDED FOR MUSCLE SPASM  90 tablet  3  . finasteride (PROSCAR) 5 MG tablet Take 1 tablet (5 mg total) by mouth daily.  30 tablet  11  . fluconazole  (DIFLUCAN) 150 MG tablet Take 2 tablets by mouth once a week for 18 weeks  36 tablet  0  . gabapentin (NEURONTIN) 300 MG capsule Take 900 mg by mouth 3 (three) times daily.      . hydrochlorothiazide (HYDRODIURIL) 25 MG tablet TAKE 1 TABLET BY MOUTH EVERY [place]  30 tablet  0  . hydrochlorothiazide (HYDRODIURIL) 25 MG tablet TAKE 1 TABLET BY MOUTH DAILY  30 tablet  0  . lisinopril (PRINIVIL,ZESTRIL) 20 MG tablet TAKE 1 TABLET BY MOUTH DAILY  30 tablet  0  . loratadine (CLARITIN) 10 MG tablet Take 1 tablet (10 mg total) by mouth daily.  30 tablet  0  . LORazepam (ATIVAN) 2 MG tablet TAKE 1 TABLET BY MOUTH TWICE DAILY AS NEEDED  60 tablet  0  . nortriptyline (PAMELOR) 50 MG capsule Take 2 capsules (100 mg total) by mouth at bedtime.  60 capsule  6  . OXcarbazepine (TRILEPTAL) 150 MG tablet One tablet twice a File for one week, then take two tablets twice a [place]  120 tablet  3  . oxyCODONE-acetaminophen (PERCOCET) 5-325 MG per tablet Take 1 tablet by mouth every 8 (eight) hours as needed for severe pain.  90 tablet  0  . oxyCODONE-acetaminophen (PERCOCET/ROXICET) 5-325 MG per tablet Take 1 tablet by mouth every 4 (four) hours as needed for severe pain. Fill 30 days after original  90 tablet  0  . oxyCODONE-acetaminophen (ROXICET) 5-325 MG per tablet Take 1 tablet by mouth every 8 (eight) hours as needed for severe pain. Fill 60 days after original  90 tablet  0  . Estill Dooms MISC Apply 1 application topically 2 (two) times daily as needed (leg pain).      . simvastatin (ZOCOR) 20 MG tablet Take 20 mg by mouth every evening.      . tiotropium (SPIRIVA) 18 MCG inhalation capsule Place 18 mcg into inhaler and inhale daily.      . traMADol (ULTRAM) 50 MG tablet Take 1-2 tablets (50-100 mg total) by mouth every 8 (eight) hours as needed.  90 tablet  0  . traMADol (ULTRAM) 50 MG tablet Take 1 tablet (50 mg total) by mouth every 8 (eight) hours as needed. Fill 60 days after original  90 tablet  0  . traMADol  (ULTRAM) 50 MG tablet Take 1 tablet (50 mg total) by mouth every 8 (eight) hours as needed. Fill 30 days after original  90 tablet  0  . traZODone (DESYREL) 100 MG tablet Take 100 mg by mouth at bedtime.      Alveda Reasons 20 MG TABS tablet TAKE 1 TABLET BY MOUTH EVERY [place] WITH SUPPER  30 tablet  0   No current facility-administered medications for this visit.    Exam:  BP 126/82  Pulse 114  Temp(Src) 98.2 F (36.8 C) (Oral)  Ht 5\' 10"  (1.778 m)  Wt 232 lb (105.235 kg)  BMI 33.29 kg/m2 Gen: Well NAD HEENT: EOMI,  MMM SKin: flaking L foot w/ thickened discolored Great toenail consistent w/ onychomycosis  No results found for this or any previous visit (from the past 72 hour(s)).  A/P (as seen in Problem list)  HTN (hypertension) At goal no change  Saddle pulmonary embolus Continue xarelto. No further symptoms  Left foot drop F/u w/ neuro in 3.5 mo.  Continue trlieptal  Low back pain Continue dosing percocet and tramadol until pt can get in w/ pain mgt.  Pt aaware that therapy will not be escelated Continue max neurontin and trileptal.  Pt not accepted by [facility] or [facility] mgt clinics due to "non-compliance or OD on flexeril. Pt to call [facility].   Tinea pedis Tinea pedis and onychomycosis Diflucan 300mg  Qwkly x 18 wks CMET in 4 wks

## 2014-04-29 NOTE — Assessment & Plan Note (Signed)
F/u w/ neuro in 3.5 mo.  Continue trlieptal

## 2014-04-29 NOTE — Patient Instructions (Signed)
Please continue to work with the Neurologist as directed Please contact the [facility] as recommended by the [facility]. Their contact information is as below  Address: [address], [place], [place] [address] [phone] Hours: Open today  9:00 am - 6:00 pm  Please come back in 3 months We will be unable to increase your pain medications Good luck in the future and with your new doctor.

## 2014-04-29 NOTE — Assessment & Plan Note (Signed)
At goal no change 

## 2014-04-29 NOTE — Addendum Note (Signed)
Addended byMarily Memos, DAVID J on: 04/29/2014 04:00 PM   Modules accepted: Orders

## 2014-04-29 NOTE — Assessment & Plan Note (Signed)
Continue xarelto. No further symptoms

## 2014-04-29 NOTE — Assessment & Plan Note (Signed)
Continue dosing percocet and tramadol until pt can get in w/ pain mgt.  Pt aaware that therapy will not be escelated Continue max neurontin and trileptal.  Pt not accepted by [facility] or [facility] mgt clinics due to "non-compliance or OD on flexeril. Pt to call [facility].

## 2014-04-29 NOTE — Assessment & Plan Note (Addendum)
Tinea pedis and onychomycosis Diflucan 300mg  Qwkly x 18 wks CMET in 4 wks

## 2014-04-30 ENCOUNTER — Other Ambulatory Visit: Payer: Self-pay | Admitting: Family Medicine

## 2014-04-30 ENCOUNTER — Telehealth: Payer: Self-pay | Admitting: Family Medicine

## 2014-04-30 DIAGNOSIS — G894 Chronic pain syndrome: Secondary | ICD-10-CM

## 2014-04-30 NOTE — Telephone Encounter (Signed)
Referral to [facility] is not appropriate--this is for substance abuse. Needs pain mgt referral Please advise

## 2014-04-30 NOTE — Telephone Encounter (Signed)
Pt called back. He needs this handled asap. He doesn't want this in his medical records showing that he has a drug problem Please advise

## 2014-05-01 NOTE — Telephone Encounter (Signed)
Spoke with patient [facility] and they don't treat pain at all.  They just help people with addictions that are trying to stop.  Informed him that I would send his records to [facility] and wait to hear from them on whether or not they will accept him as a patient.  Will forward to MD to place a new referral. Jazmin [name],CMA

## 2014-05-01 NOTE — Telephone Encounter (Signed)
LM for patient to call.  Will send his records to [facility] for review.  Jazmin [name],CMA

## 2014-05-01 NOTE — Telephone Encounter (Signed)
Patient returns calls. Please call back .

## 2014-05-02 NOTE — Addendum Note (Signed)
Addended by: Marily Memos, DAVID J on: 05/02/2014 12:04 PM   Modules accepted: Orders

## 2014-05-16 ENCOUNTER — Other Ambulatory Visit: Payer: Self-pay | Admitting: *Deleted

## 2014-05-20 MED ORDER — RIVAROXABAN 20 MG PO TABS: 20.0000 mg | ORAL_TABLET | Freq: Every day | ORAL | Status: DC

## 2014-05-20 MED ORDER — SIMVASTATIN 20 MG PO TABS: 20.0000 mg | ORAL_TABLET | Freq: Every evening | ORAL | Status: DC

## 2014-05-20 NOTE — Telephone Encounter (Signed)
[facility].  Will wait to hear from them regarding acceptance of patient into their practice.  Jazmin [name],CMA

## 2014-05-28 ENCOUNTER — Encounter: Payer: Self-pay | Admitting: Family Medicine

## 2014-05-28 ENCOUNTER — Other Ambulatory Visit: Payer: Self-pay | Admitting: Family Medicine

## 2014-05-28 ENCOUNTER — Ambulatory Visit (INDEPENDENT_AMBULATORY_CARE_PROVIDER_SITE_OTHER): Payer: Medicaid Other | Admitting: Family Medicine

## 2014-05-28 ENCOUNTER — Other Ambulatory Visit (INDEPENDENT_AMBULATORY_CARE_PROVIDER_SITE_OTHER): Payer: Self-pay | Admitting: Family Medicine

## 2014-05-28 VITALS — BP 134/84 | HR 90 | Temp 98.4°F | Wt 244.0 lb

## 2014-05-28 DIAGNOSIS — J4489 Other specified chronic obstructive pulmonary disease: Secondary | ICD-10-CM

## 2014-05-28 DIAGNOSIS — I10 Essential (primary) hypertension: Secondary | ICD-10-CM

## 2014-05-28 DIAGNOSIS — J449 Chronic obstructive pulmonary disease, unspecified: Secondary | ICD-10-CM

## 2014-05-28 DIAGNOSIS — G894 Chronic pain syndrome: Secondary | ICD-10-CM | POA: Insufficient documentation

## 2014-05-28 DIAGNOSIS — I2692 Saddle embolus of pulmonary artery without acute cor pulmonale: Secondary | ICD-10-CM

## 2014-05-28 DIAGNOSIS — F411 Generalized anxiety disorder: Secondary | ICD-10-CM

## 2014-05-28 DIAGNOSIS — B353 Tinea pedis: Secondary | ICD-10-CM

## 2014-05-28 LAB — COMPREHENSIVE METABOLIC PANEL
ALBUMIN: 4 g/dL (ref 3.5–5.2)
ALT: 19 U/L (ref 0–53)
AST: 13 U/L (ref 0–37)
Alkaline Phosphatase: 74 U/L (ref 39–117)
BUN: 13 mg/dL (ref 6–23)
CO2: 25 mEq/L (ref 19–32)
Calcium: 9.2 mg/dL (ref 8.4–10.5)
Chloride: 101 mEq/L (ref 96–112)
Creat: 0.67 mg/dL (ref 0.50–1.35)
GLUCOSE: 96 mg/dL (ref 70–99)
POTASSIUM: 3.9 meq/L (ref 3.5–5.3)
SODIUM: 135 meq/L (ref 135–145)
TOTAL PROTEIN: 6.6 g/dL (ref 6.0–8.3)
Total Bilirubin: 0.3 mg/dL (ref 0.2–1.2)

## 2014-05-28 MED ORDER — LORAZEPAM 2 MG PO TABS: ORAL_TABLET | ORAL | Status: DC

## 2014-05-28 MED ORDER — HYDROCHLOROTHIAZIDE 25 MG PO TABS: 25.0000 mg | ORAL_TABLET | Freq: Every day | ORAL | Status: DC

## 2014-05-28 MED ORDER — CYCLOBENZAPRINE HCL 10 MG PO TABS: ORAL_TABLET | ORAL | Status: DC

## 2014-05-28 MED ORDER — LORATADINE 10 MG PO TABS: 10.0000 mg | ORAL_TABLET | Freq: Every day | ORAL | Status: DC

## 2014-05-28 MED ORDER — LISINOPRIL 20 MG PO TABS: 20.0000 mg | ORAL_TABLET | Freq: Every day | ORAL | Status: DC

## 2014-05-28 NOTE — Patient Instructions (Signed)
I will send in the refills for your medications.  Please check MyChart for the results of your labs, if I need to discuss anything with you I will give you a phone call.  Please keep your pain management appointment.

## 2014-05-28 NOTE — Assessment & Plan Note (Signed)
No worsening of symptoms, still taking ativan (on since at least 2013). P: Refill ativan 2mg  #60. This will need to be discussed at future visit to try and titrate down.

## 2014-05-28 NOTE — Assessment & Plan Note (Signed)
A: Improving on diflucan P: Continue diflucan until course runs out. Repeat cmet today to monitor kidney function.

## 2014-05-28 NOTE — Assessment & Plan Note (Addendum)
Stable, below target goal P: continue current regimen. Refills given.

## 2014-05-28 NOTE — Progress Notes (Signed)
Patient ID: Ademola Lambright, male   DOB: 03/15/1961, 53 y.o.   MRN: [id number]   Subjective:    Patient ID: Quillian Quince Shiraishi, male    DOB: Sep 03, 1961, 53 y.o.   MRN: [id number]  HPI  CC: kidney function   # Left leg/foot tinea infection:  Started on 1 month diflucan  Thinks it is doing much better since starting this ROS: Numbness/tingling of both feet (chronic, says from back injury), no bleeding or ulcerations  # [facility] management:  [facility]  Worst pain left sciatica pain today, left foot drop ROS: no fevers/chills, no increased pain episodes recently  # PE:  Saddle embolism in April 2015  Currently on Xarelto ROS: no worsened SOB, no black/bloody stools, no blood in urine  # COPD:  Feels stable on current meds  Uses albuterol once a week  Review of Systems   See HPI for ROS. Objective:  BP 134/84  Pulse 90  Temp(Src) 98.4 F (36.9 C) (Oral)  Wt 244 lb (110.678 kg)  General: NAD, in wheelchair with left lower leg boot/splint on CV: RRR, normal heart sounds, no murmurs, 2+ radial pulses, DP/PT pulses not palpable Resp: diminished breath sounds bilaterally, no appreciable w/r/c Skin: left foot with dry/flaking skin Neuro: alert and oriented, no focal deficits     Assessment & Plan:  See Problem List Documentation

## 2014-05-28 NOTE — Assessment & Plan Note (Signed)
A: Stable, no further worsening of symptoms P: Continue xarelto 20mg  daily

## 2014-05-28 NOTE — Assessment & Plan Note (Signed)
A: Stable P: Continue current regimen

## 2014-05-28 NOTE — Assessment & Plan Note (Signed)
A: in process of getting seen by pain management, concerned that at the first visit he was told they want to do a spinal block and that he is on Xarelto P: Keep pain management appt. Refills for flexeril. No refills sent for opioids.

## 2014-05-30 ENCOUNTER — Other Ambulatory Visit: Payer: Self-pay | Admitting: *Deleted

## 2014-06-02 ENCOUNTER — Other Ambulatory Visit: Payer: Self-pay | Admitting: *Deleted

## 2014-06-04 MED ORDER — CYCLOBENZAPRINE HCL 10 MG PO TABS: ORAL_TABLET | ORAL | Status: DC

## 2014-06-11 ENCOUNTER — Other Ambulatory Visit: Payer: Self-pay | Admitting: *Deleted

## 2014-06-11 MED ORDER — GABAPENTIN 300 MG PO CAPS: 900.0000 mg | ORAL_CAPSULE | Freq: Three times a day (TID) | ORAL | Status: DC

## 2014-06-12 ENCOUNTER — Telehealth: Payer: Self-pay | Admitting: Family Medicine

## 2014-06-12 MED ORDER — GABAPENTIN 300 MG PO CAPS: 900.0000 mg | ORAL_CAPSULE | Freq: Three times a day (TID) | ORAL | Status: DC

## 2014-06-12 NOTE — Telephone Encounter (Signed)
Rx resent for gabapentin 300mg  #270, refills 3. -Dr. Lamar Benes

## 2014-06-12 NOTE — Telephone Encounter (Signed)
Patient states gabapentin RX for filled incorrectly. Pt only prescribed 10 Bandel supply (90 tabs). He take 3 tabs 3 times daily. Pt requesting correct amount to be sent to pharmacy (270 tabs=month supply). Please advise.

## 2014-06-18 ENCOUNTER — Other Ambulatory Visit: Payer: Self-pay | Admitting: Family Medicine

## 2014-06-25 ENCOUNTER — Other Ambulatory Visit: Payer: Self-pay | Admitting: Family Medicine

## 2014-06-26 NOTE — Telephone Encounter (Signed)
Can you call patient and let him know prescription is available at the front desk? Thank you.

## 2014-06-26 NOTE — Telephone Encounter (Signed)
Pt is aware of this and girlfriend will pick this up.  Dotty Gonzalo,CMA

## 2014-07-02 ENCOUNTER — Other Ambulatory Visit: Payer: Self-pay | Admitting: Family Medicine

## 2014-07-03 ENCOUNTER — Other Ambulatory Visit: Payer: Self-pay | Admitting: *Deleted

## 2014-07-03 MED ORDER — HYDROCHLOROTHIAZIDE 25 MG PO TABS: 25.0000 mg | ORAL_TABLET | Freq: Every day | ORAL | Status: DC

## 2014-07-09 ENCOUNTER — Encounter: Payer: Self-pay | Admitting: Clinical

## 2014-07-09 ENCOUNTER — Other Ambulatory Visit: Payer: Self-pay | Admitting: *Deleted

## 2014-07-09 MED ORDER — BUTALBITAL-APAP-CAFFEINE 50-325-40 MG PO TABS
1.0000 | ORAL_TABLET | Freq: Two times a day (BID) | ORAL | Status: DC | PRN
Start: 2014-07-09 — End: 2014-07-11

## 2014-07-09 NOTE — Progress Notes (Signed)
Patient-Centered Care Plan from [facility] 03/13/14  Personal Goals (LTG): "I want to get my leg fixed." "I want to go see my family in [place]."  STG: Patient will receive a referral to a pain clinic within the next month. LTG: Patient will attend an appt. @ a pain clinic within the next 2 months. STG: Patient will receive a prescription for a tub bench within the next 2 weeks. LTG: Patient will receive the tub bench within the next month. STG: Patient will be linked with [facility] within 2 weeks. LTG: Patient will receive [facility] within 1 month.  Motivation level: 7

## 2014-07-11 ENCOUNTER — Other Ambulatory Visit: Payer: Self-pay | Admitting: Family Medicine

## 2014-07-11 MED ORDER — BUTALBITAL-APAP-CAFFEINE 50-325-40 MG PO TABS
1.0000 | ORAL_TABLET | Freq: Two times a day (BID) | ORAL | Status: DC | PRN
Start: 2014-07-11 — End: 2014-09-09

## 2014-07-15 ENCOUNTER — Other Ambulatory Visit: Payer: Self-pay | Admitting: Family Medicine

## 2014-07-15 ENCOUNTER — Other Ambulatory Visit: Payer: Self-pay | Admitting: Neurology

## 2014-07-21 ENCOUNTER — Other Ambulatory Visit: Payer: Self-pay | Admitting: *Deleted

## 2014-07-21 MED ORDER — CYCLOBENZAPRINE HCL 10 MG PO TABS: ORAL_TABLET | ORAL | Status: DC

## 2014-08-04 ENCOUNTER — Other Ambulatory Visit: Payer: Self-pay | Admitting: *Deleted

## 2014-08-06 ENCOUNTER — Other Ambulatory Visit: Payer: Self-pay | Admitting: *Deleted

## 2014-08-06 MED ORDER — LORAZEPAM 2 MG PO TABS: ORAL_TABLET | ORAL | Status: DC

## 2014-08-06 NOTE — Telephone Encounter (Signed)
LM for patient that rx is up front for pick up. Jazmin [name],CMA

## 2014-08-06 NOTE — Telephone Encounter (Signed)
Can you call and let the patient know the prescription is up front? Thanks.

## 2014-08-07 NOTE — Telephone Encounter (Signed)
Rx was printed and left up front. Can you call? Thanks.

## 2014-08-08 NOTE — Telephone Encounter (Signed)
LMOVM informing patient. Fle[place], [name] [name]

## 2014-08-13 ENCOUNTER — Ambulatory Visit: Payer: Medicaid Other | Admitting: Family Medicine

## 2014-08-15 ENCOUNTER — Encounter: Payer: Self-pay | Admitting: Family Medicine

## 2014-08-25 ENCOUNTER — Other Ambulatory Visit: Payer: Self-pay | Admitting: *Deleted

## 2014-08-25 MED ORDER — TRAZODONE HCL 100 MG PO TABS: 100.0000 mg | ORAL_TABLET | Freq: Every day | ORAL | Status: DC

## 2014-08-27 ENCOUNTER — Other Ambulatory Visit: Payer: Self-pay | Admitting: Family Medicine

## 2014-09-09 ENCOUNTER — Encounter: Payer: Self-pay | Admitting: Family Medicine

## 2014-09-09 ENCOUNTER — Ambulatory Visit (INDEPENDENT_AMBULATORY_CARE_PROVIDER_SITE_OTHER): Payer: Medicaid Other | Admitting: Family Medicine

## 2014-09-09 VITALS — BP 107/69 | HR 91 | Temp 98.1°F | Ht 70.0 in | Wt 256.0 lb

## 2014-09-09 DIAGNOSIS — F32A Depression, unspecified: Secondary | ICD-10-CM

## 2014-09-09 DIAGNOSIS — I2692 Saddle embolus of pulmonary artery without acute cor pulmonale: Secondary | ICD-10-CM

## 2014-09-09 DIAGNOSIS — Z23 Encounter for immunization: Secondary | ICD-10-CM

## 2014-09-09 DIAGNOSIS — Z789 Other specified health status: Secondary | ICD-10-CM

## 2014-09-09 DIAGNOSIS — B353 Tinea pedis: Secondary | ICD-10-CM

## 2014-09-09 DIAGNOSIS — F329 Major depressive disorder, single episode, unspecified: Secondary | ICD-10-CM

## 2014-09-09 DIAGNOSIS — I2699 Other pulmonary embolism without acute cor pulmonale: Secondary | ICD-10-CM

## 2014-09-09 MED ORDER — LORAZEPAM 2 MG PO TABS: ORAL_TABLET | ORAL | Status: DC

## 2014-09-09 MED ORDER — SIMVASTATIN 20 MG PO TABS: 20.0000 mg | ORAL_TABLET | Freq: Every day | ORAL | Status: DC

## 2014-09-09 MED ORDER — NORTRIPTYLINE HCL 75 MG PO CAPS: 150.0000 mg | ORAL_CAPSULE | Freq: Every day | ORAL | Status: DC

## 2014-09-09 MED ORDER — GABAPENTIN 300 MG PO CAPS: 900.0000 mg | ORAL_CAPSULE | Freq: Three times a day (TID) | ORAL | Status: DC

## 2014-09-09 MED ORDER — BUTALBITAL-APAP-CAFFEINE 50-325-40 MG PO TABS: 1.0000 | ORAL_TABLET | Freq: Two times a day (BID) | ORAL | Status: DC | PRN

## 2014-09-09 MED ORDER — TRAZODONE HCL 100 MG PO TABS: 100.0000 mg | ORAL_TABLET | Freq: Every day | ORAL | Status: DC

## 2014-09-09 NOTE — Assessment & Plan Note (Signed)
On nortriptyline, increased himself to 150mg  which he is tolerating currently. PHQ9 score 19 today. Asked to f/u in 1-2 months to eval... May need to consider switching agent.

## 2014-09-09 NOTE — Assessment & Plan Note (Signed)
Resolved. Stop diflucan.

## 2014-09-09 NOTE — Assessment & Plan Note (Signed)
A: Stable. Recommending indefinite anticoagulation at this time. P: cont xarelto

## 2014-09-09 NOTE — Progress Notes (Signed)
   Subjective:    Patient ID: [name] [name], male    DOB: [birth year], 53 y.o.   MRN: [id number]  HPI  CC: med refills  # PE/DVT:  Asks about his need for continued xarelto ROS: no CP, no SOB, no leg swelling  # Tinea pedis  Still says he is taking diflucan  Fungus has resolved  # Depression  Increased his nortriptyline to 150mg  qhs  Feels he is doing a little better since that increase  PHQ9 - 19, very difficult  Denies SI/HI  Review of Systems   See HPI for ROS. All other systems reviewed and are negative.  Past medical history, surgical, family, and social history reviewed and updated in the EMR as appropriate. Objective:  BP 107/69 mmHg  Pulse 91  Temp(Src) 98.1 F (36.7 C) (Oral)  Ht 5\' 10"  (1.778 m)  Wt 256 lb (116.121 kg)  BMI 36.73 kg/m2 Vitals reviewed  General: NAD CV: RRR, normal heart sounds, no murmurs. 2+ radial pulses bilat Resp: CTAB, normal effort Ext: no edema/cyanosis Psych: mood normal, affect congruent.  Assessment & Plan:  See Problem List Documentation

## 2014-09-09 NOTE — Patient Instructions (Addendum)
It was good to see you again.  Regarding the xarelto, because you had a large PE and clots in both legs I would recommend you remain on anticoagulation indefinitely.   You can stop the diflucan. If your fungus returns you can start with an over the counter cream, and if need be we can restart the diflucan.  I would like you to return in about 1-2 months to follow up for your depression.

## 2014-09-10 ENCOUNTER — Encounter: Payer: Self-pay | Admitting: Family Medicine

## 2014-09-10 LAB — VARICELLA ZOSTER ANTIBODY, IGG: VARICELLA IGG: 405.4 {index} — AB (ref <135.00)

## 2014-09-14 ENCOUNTER — Other Ambulatory Visit: Payer: Self-pay | Admitting: Family Medicine

## 2014-09-17 ENCOUNTER — Other Ambulatory Visit: Payer: Self-pay | Admitting: Family Medicine

## 2014-09-18 ENCOUNTER — Other Ambulatory Visit: Payer: Self-pay | Admitting: *Deleted

## 2014-09-19 MED ORDER — NORTRIPTYLINE HCL 75 MG PO CAPS: 150.0000 mg | ORAL_CAPSULE | Freq: Every day | ORAL | Status: DC

## 2014-09-25 ENCOUNTER — Other Ambulatory Visit: Payer: Self-pay | Admitting: Family Medicine

## 2014-11-03 ENCOUNTER — Other Ambulatory Visit: Payer: Self-pay | Admitting: Family Medicine

## 2014-11-13 ENCOUNTER — Encounter: Payer: Self-pay | Admitting: Family Medicine

## 2014-11-24 ENCOUNTER — Telehealth: Payer: Self-pay | Admitting: Family Medicine

## 2014-11-24 MED ORDER — LORAZEPAM 2 MG PO TABS: ORAL_TABLET | ORAL | Status: DC

## 2014-11-24 NOTE — Telephone Encounter (Signed)
Pt need refill on his Ativan.  No available appts for Jan.  Next available will be on Feb 9th

## 2014-11-24 NOTE — Telephone Encounter (Signed)
Phoned in rx for ativan 2mg  #60 refill 0. -Dr. Lamar Benes

## 2014-12-08 ENCOUNTER — Other Ambulatory Visit: Payer: Self-pay | Admitting: Neurology

## 2014-12-29 ENCOUNTER — Other Ambulatory Visit: Payer: Self-pay | Admitting: *Deleted

## 2014-12-30 ENCOUNTER — Other Ambulatory Visit: Payer: Self-pay | Admitting: Family Medicine

## 2014-12-30 ENCOUNTER — Ambulatory Visit (INDEPENDENT_AMBULATORY_CARE_PROVIDER_SITE_OTHER): Payer: Medicaid Other | Admitting: Family Medicine

## 2014-12-30 ENCOUNTER — Encounter: Payer: Self-pay | Admitting: Family Medicine

## 2014-12-30 VITALS — BP 134/98 | HR 82 | Ht 70.0 in | Wt 250.0 lb

## 2014-12-30 DIAGNOSIS — B353 Tinea pedis: Secondary | ICD-10-CM

## 2014-12-30 DIAGNOSIS — L089 Local infection of the skin and subcutaneous tissue, unspecified: Secondary | ICD-10-CM

## 2014-12-30 MED ORDER — CLOTRIMAZOLE 1 % EX OINT: TOPICAL_OINTMENT | CUTANEOUS | Status: DC

## 2014-12-30 MED ORDER — CEPHALEXIN 500 MG PO CAPS: 500.0000 mg | ORAL_CAPSULE | Freq: Four times a day (QID) | ORAL | Status: DC

## 2014-12-30 NOTE — Patient Instructions (Addendum)
Thank you for coming to see me today. It was a pleasure. Today we talked about:   Toe infection: I will treat you with Keflex for 10 days. Please return in the next 3-5 days for reevaluation of your toe since you are not able to feel sensation  Tinea pedis: I am prescribing Clotrimazole for you to use for the next 2 weeks  Also, please make an appointment for a regular office visit to address your chronic issues.  If you have any questions or concerns, please do not hesitate to call the office at [phone].  Sincerely,  Cordelia Poche, MD

## 2014-12-30 NOTE — Progress Notes (Signed)
    Subjective   [name] [name] is a 54 y.o. male that presents for a same [place] visit  1. Toe infection: Four days ago, patient was lifting comforter over his body, when it snagged his left toenail and took it off. He has had problems with this toenail before in the past. He has used antifungals in the past, which has helped. He has used triple antibiotics which have not helped much. He had purulent drainage which improved with washing. No recent trauma.   History  Substance Use Topics  . Smoking status: Current Every [place] Smoker -- 1.00 packs/[place]    Types: Cigarettes  . Smokeless tobacco: Never Used     Comment: decreased to 1ppd  . Alcohol Use: No     Comment: former    ROS Per HPI  Objective   BP 134/98 mmHg  Pulse 82  Ht 5\' 10"  (1.778 m)  Wt 250 lb (113.399 kg)  BMI 35.87 kg/m2  General: Well appearing male in no distress Extremities: Left great toe without a toenail. Nailbed is clean, without purulence. Mild erythema and no swelling. Neuro: No feeling in lower extremities  Assessment and Plan   Toe infection: does not appear very infected, but patient showed a photograph showing purulence from [place] before. Patient wearing appropriate shoes.  Keflex 500 QID. #40  Return precautions discussed  Discussed close follow-up with patient as he has not been seeing his PCP regularly. Diabetes and neuropathy complicate his situation

## 2014-12-31 MED ORDER — LORAZEPAM 2 MG PO TABS: ORAL_TABLET | ORAL | Status: DC

## 2014-12-31 MED ORDER — BUTALBITAL-APAP-CAFFEINE 50-325-40 MG PO TABS: 1.0000 | ORAL_TABLET | Freq: Two times a day (BID) | ORAL | Status: DC | PRN

## 2014-12-31 NOTE — Telephone Encounter (Signed)
I did not see this patient so I am unsure of appropriateness of antibiotics. Routing to Dr. Lonny Prude.

## 2015-01-02 ENCOUNTER — Telehealth: Payer: Self-pay | Admitting: Family Medicine

## 2015-01-02 NOTE — Telephone Encounter (Signed)
Medication called into [facility].  Pt is aware. Jazmin [name],CMA

## 2015-01-02 NOTE — Telephone Encounter (Signed)
Pt wants to know if his 2 prescriptions he requesting that are here ready for p/u can be either called into the pharmacy or faxed? Says the pharmacy told him we could not send it electronically but could fax. Pt does not have transportation. Goes to [employer]

## 2015-01-13 ENCOUNTER — Ambulatory Visit: Payer: Medicaid Other | Admitting: Family Medicine

## 2015-01-14 ENCOUNTER — Other Ambulatory Visit: Payer: Self-pay | Admitting: Family Medicine

## 2015-02-05 IMAGING — CT CT ANGIO CHEST
2 of 9 series · 19 of 46 positions shown · IV contrast (Omni 300)
Comparison: DG CHEST 1V PORT dated 03/02/2014; CT ANGIO CHEST W/CM
&/OR WO/CM dated 02/19/2014

CLINICAL DATA: Chest pain and shortness of breath. Recent diagnosis
of pulmonary embolism.

EXAM:
CT ANGIOGRAPHY CHEST WITH CONTRAST
TECHNIQUE: Multidetector CT imaging of the chest was performed using the
standard protocol during bolus administration of intravenous
contrast. Multiplanar CT image reconstructions and MIPs were
obtained to evaluate the vascular anatomy.
CONTRAST:  100mL OMNIPAQUE IOHEXOL 350 MG/ML SOLN

[Series 5: thins · axial · 0.74mm/px · z∈[+1307,+1543]mm · 16 of 266 slices shown]
[im 15/266  lung]
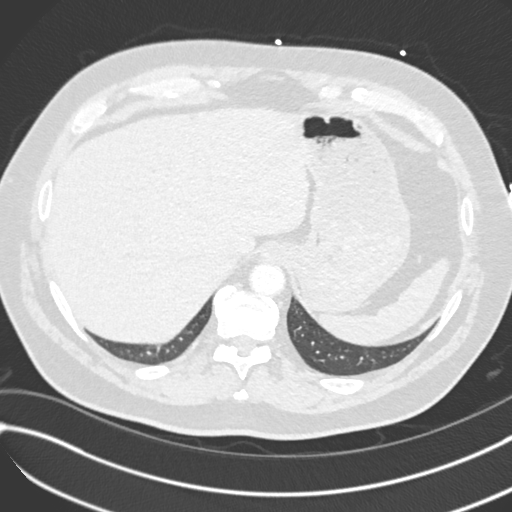
[im 30/266  soft-tissue]
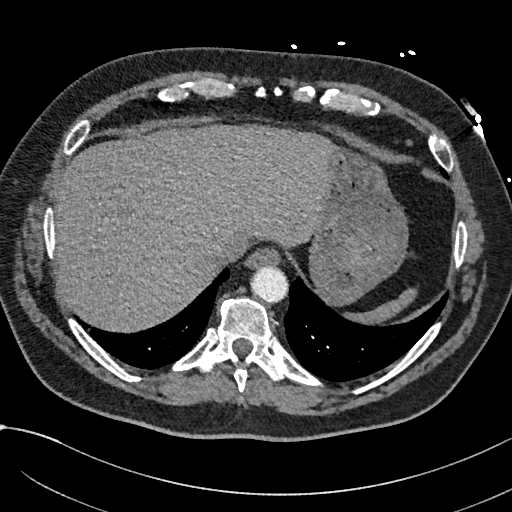
[im 45/266  lung]
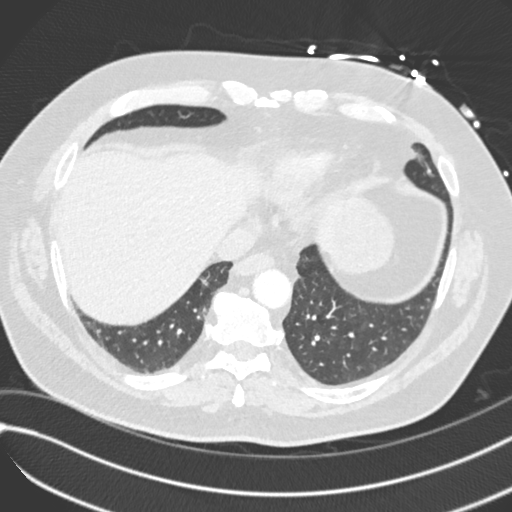
[im 59/266  soft-tissue]
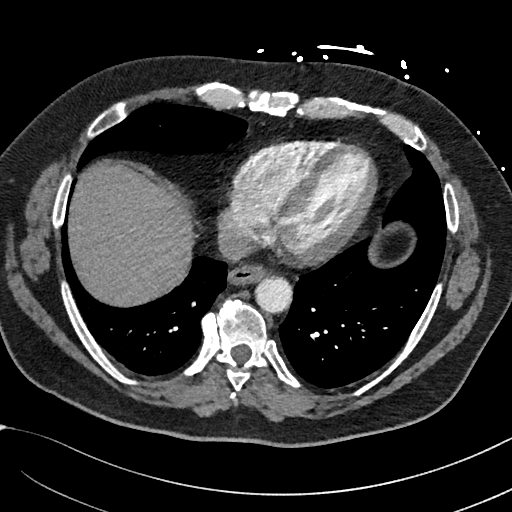
[im 74/266  lung]
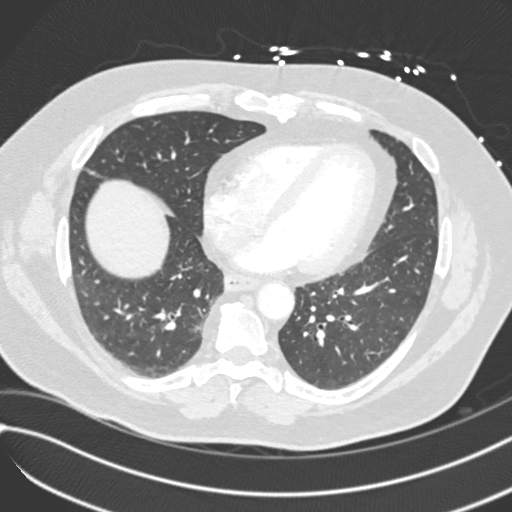
[im 89/266  soft-tissue]
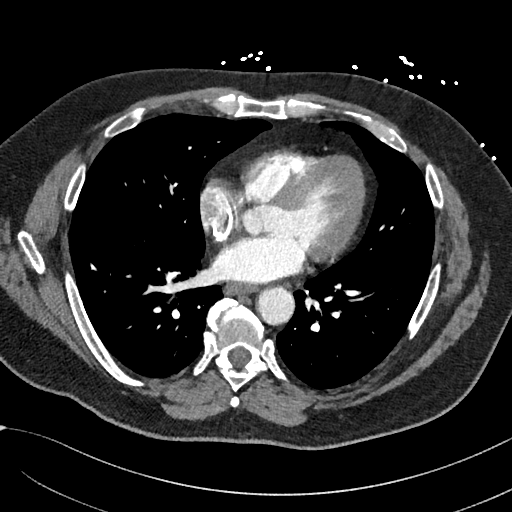
[im 104/266  lung]
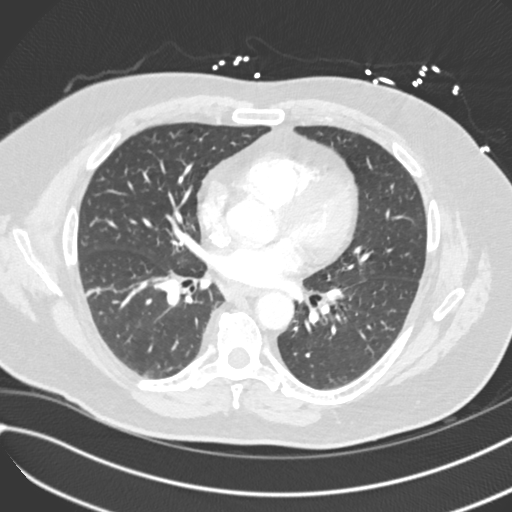
[im 118/266  soft-tissue]
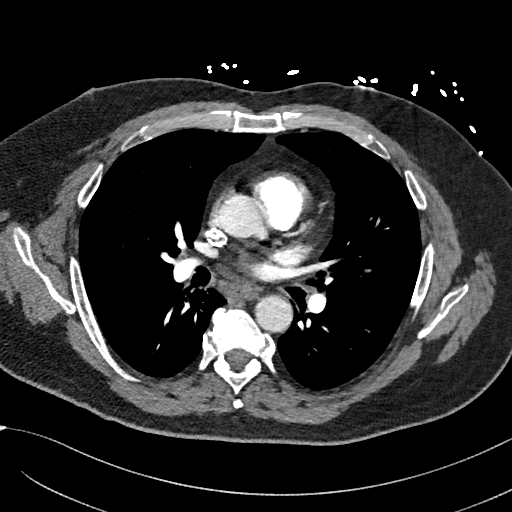
[im 148/266  lung]
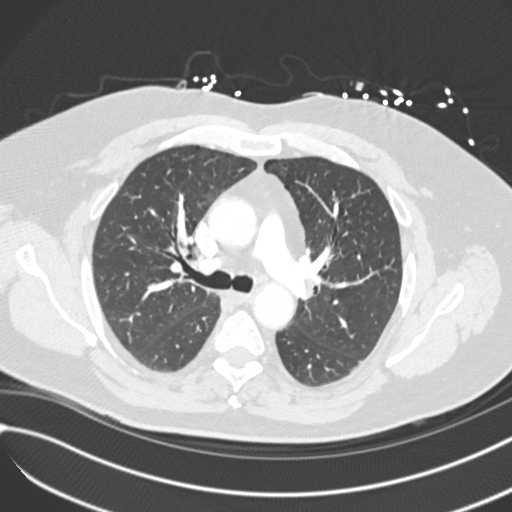
[im 162/266  soft-tissue]
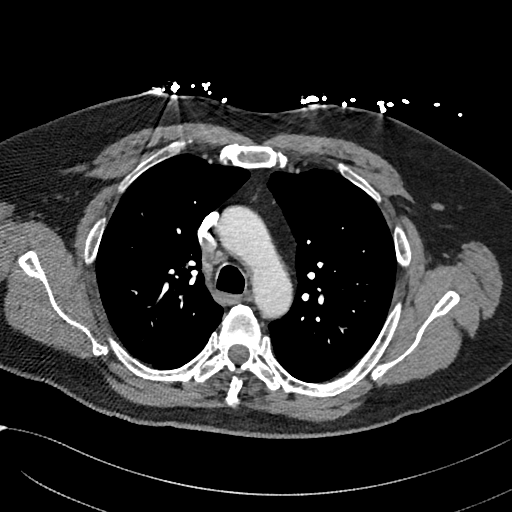
[im 177/266  lung]
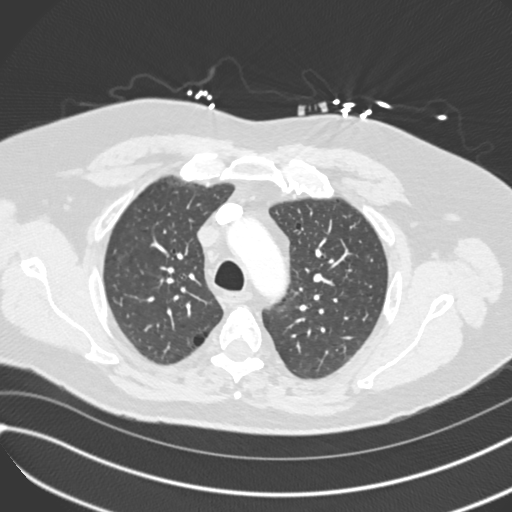
[im 192/266  soft-tissue]
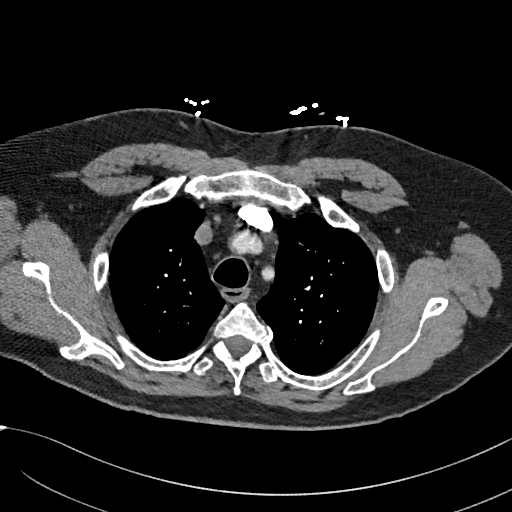
[im 207/266  lung]
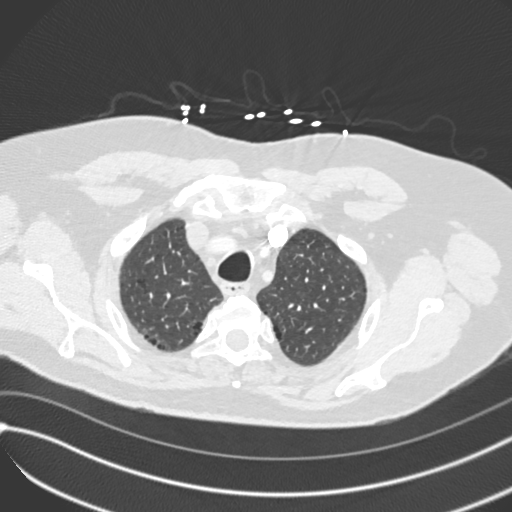
[im 221/266  soft-tissue]
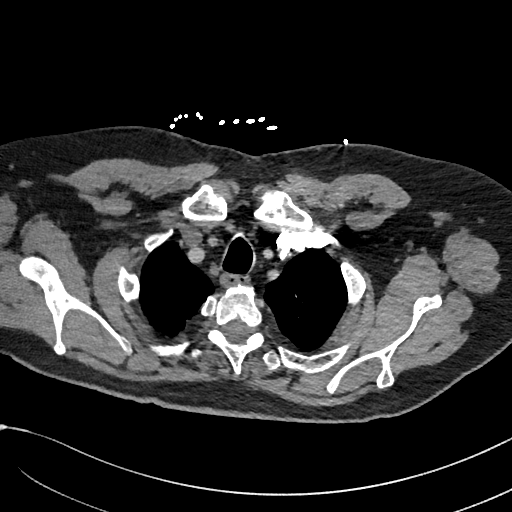
[im 236/266  lung]
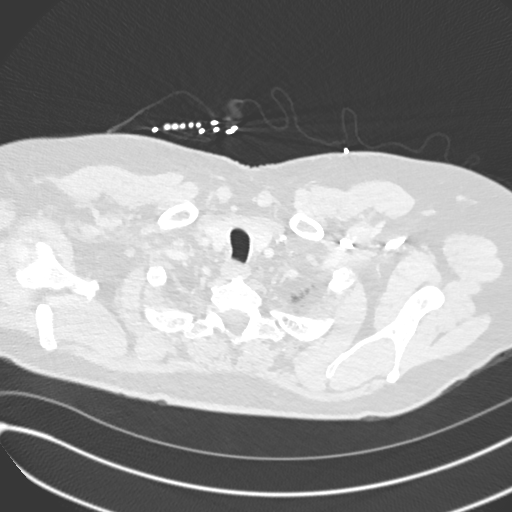
[im 251/266  soft-tissue]
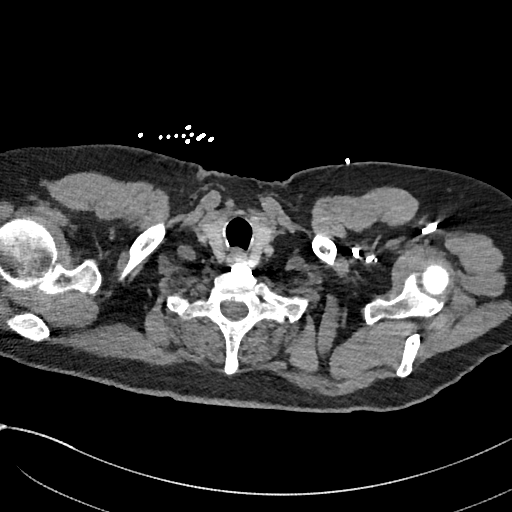

[Series 7: coronal mpr · coronal · 0.59mm/px · 3 of 139 slices shown]
[im 35/139  soft-tissue]
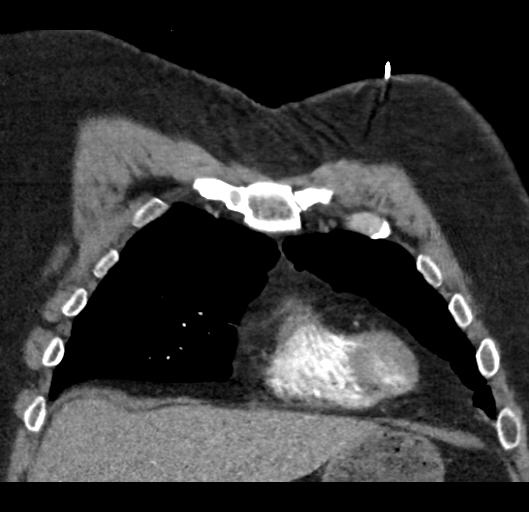
[im 70/139  soft-tissue]
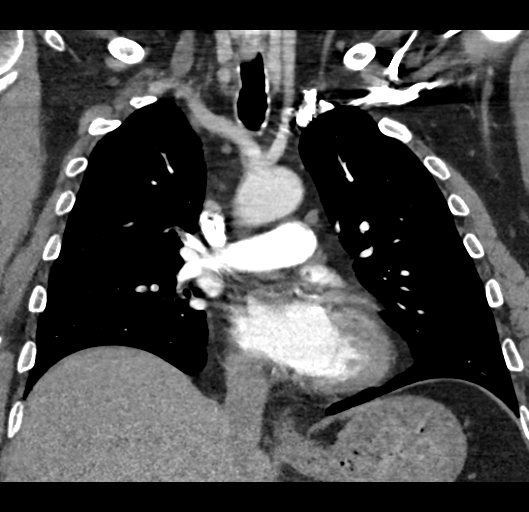
[im 104/139  soft-tissue]
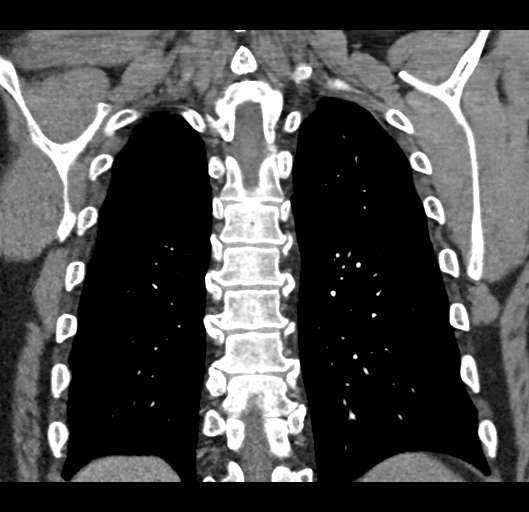

[19 of 46 positions shown; findings below may reference images not displayed]

FINDINGS: Main pulmonary artery is not enlarged. Interval breakdown of saddle
embolus. Residual filling defect within the right upper lobe
segmental to subsegmental branches, nonocclusive. Tiny non filling
pulmonary emboli within the subsegmental branches of the right
middle lobe. Nonocclusive thrombus within the left lower lobe
segmental to subsegmental branches, improved.

The heart and pericardium are unremarkable, no right heart strain.
Two vessel aortic arch, normal variant, but thoracic aorta is
overall normal in course and caliber with minimal intimal
thickening.

Moderate centrilobular and to lesser extent paraseptal emphysema. No
pleural effusions or focal consolidations. Resolution of right lower
lobe atelectasis. No pneumothorax.

Unremarkable thoracic esophagus. No lymphadenopathy by CT size
criteria; 8 mm right paratracheal lymph node, 8 mm aortopulmonary
window lymph node. Included view of the abdomen is unremarkable.
Soft tissues and included osseous structures are nonsuspicious.
ACDF.

Review of the MIP images confirms the above findings.
IMPRESSION: Improved, decreased residual nonocclusive pulmonary arterial emboli.
No thrombus propagation, interval dissolution of saddle component.
No right heart strain.

  By: Mithinga Chole

## 2015-02-25 ENCOUNTER — Other Ambulatory Visit: Payer: Self-pay | Admitting: Family Medicine

## 2015-03-02 MED ORDER — LORAZEPAM 2 MG PO TABS: ORAL_TABLET | ORAL | Status: DC

## 2015-03-02 NOTE — Addendum Note (Signed)
Addended by: Leone Brand on: 03/02/2015 01:18 PM   Modules accepted: Orders

## 2015-03-02 NOTE — Telephone Encounter (Signed)
Rx printed and will be left up front for pickup. Please call pt and let him know. -Dr. Lamar Benes

## 2015-03-04 ENCOUNTER — Ambulatory Visit: Payer: Medicaid Other | Admitting: Family Medicine

## 2015-03-05 ENCOUNTER — Telehealth: Payer: Self-pay | Admitting: Family Medicine

## 2015-03-05 NOTE — Telephone Encounter (Signed)
Pt forgot about his appt on the 27th. He rescheduled and is scheduled to come in on June 1st. However, he needs his medication refilled to last him to this appt time. The medication that needs a refill is his LORazepam (ATIVAN) 2 MG tablet / thanks [employer], ASA

## 2015-03-06 NOTE — Telephone Encounter (Signed)
LM for patient that "script you requested is ready for pick up." Demarrion Meiklejohn,CMA

## 2015-03-06 NOTE — Telephone Encounter (Signed)
This was printed and left up front. Can you call him and let him know? Thanks. -Dr. Lamar Benes

## 2015-03-10 ENCOUNTER — Emergency Department (HOSPITAL_COMMUNITY)
Admission: EM | Admit: 2015-03-10 | Discharge: 2015-03-10 | Disposition: A | Discharge status: Home / Self Care | Payer: MEDICAID | Attending: Emergency Medicine | Admitting: Emergency Medicine

## 2015-03-10 ENCOUNTER — Encounter (HOSPITAL_COMMUNITY): Payer: Self-pay | Admitting: *Deleted

## 2015-03-10 DIAGNOSIS — Z9889 Other specified postprocedural states: Secondary | ICD-10-CM | POA: Insufficient documentation

## 2015-03-10 DIAGNOSIS — Z79899 Other long term (current) drug therapy: Secondary | ICD-10-CM | POA: Diagnosis not present

## 2015-03-10 DIAGNOSIS — Z792 Long term (current) use of antibiotics: Secondary | ICD-10-CM | POA: Insufficient documentation

## 2015-03-10 DIAGNOSIS — J441 Chronic obstructive pulmonary disease with (acute) exacerbation: Secondary | ICD-10-CM | POA: Diagnosis not present

## 2015-03-10 DIAGNOSIS — F419 Anxiety disorder, unspecified: Secondary | ICD-10-CM | POA: Diagnosis not present

## 2015-03-10 DIAGNOSIS — I10 Essential (primary) hypertension: Secondary | ICD-10-CM | POA: Insufficient documentation

## 2015-03-10 DIAGNOSIS — Z720 Tobacco use: Secondary | ICD-10-CM | POA: Diagnosis not present

## 2015-03-10 DIAGNOSIS — G43909 Migraine, unspecified, not intractable, without status migrainosus: Secondary | ICD-10-CM | POA: Insufficient documentation

## 2015-03-10 DIAGNOSIS — Z86711 Personal history of pulmonary embolism: Secondary | ICD-10-CM | POA: Insufficient documentation

## 2015-03-10 DIAGNOSIS — Z8719 Personal history of other diseases of the digestive system: Secondary | ICD-10-CM | POA: Diagnosis not present

## 2015-03-10 DIAGNOSIS — R0602 Shortness of breath: Secondary | ICD-10-CM | POA: Diagnosis present

## 2015-03-10 LAB — BASIC METABOLIC PANEL
Anion gap: 12 (ref 5–15)
BUN: 15 mg/dL (ref 6–20)
CALCIUM: 10.2 mg/dL (ref 8.9–10.3)
CO2: 29 mmol/L (ref 22–32)
Chloride: 95 mmol/L — ABNORMAL LOW (ref 101–111)
Creatinine, Ser: 0.86 mg/dL (ref 0.61–1.24)
GFR calc non Af Amer: >60 mL/min (ref >60)
GLUCOSE: 108 mg/dL — AB (ref 70–99)
Potassium: 3 mmol/L — ABNORMAL LOW (ref 3.5–5.1)
SODIUM: 136 mmol/L (ref 135–145)

## 2015-03-10 LAB — CBC
HCT: 38.5 % — ABNORMAL LOW (ref 39.0–52.0)
HEMOGLOBIN: 13.4 g/dL (ref 13.0–17.0)
MCH: 32.6 pg (ref 26.0–34.0)
MCHC: 34.8 g/dL (ref 30.0–36.0)
MCV: 93.7 fL (ref 78.0–100.0)
Platelets: 228 10*3/uL (ref 150–400)
RBC: 4.11 MIL/uL — AB (ref 4.22–5.81)
RDW: 12.8 % (ref 11.5–15.5)
WBC: 9.7 10*3/uL (ref 4.0–10.5)

## 2015-03-10 LAB — I-STAT TROPONIN, ED: Troponin i, poc: 0 ng/mL (ref 0.00–0.08)

## 2015-03-10 MED ORDER — POTASSIUM CHLORIDE ER 10 MEQ PO TBCR
10.0000 meq | EXTENDED_RELEASE_TABLET | Freq: Every day | ORAL | Status: DC
Start: 2015-03-10 — End: 2015-07-21

## 2015-03-10 NOTE — ED Notes (Signed)
Pt reports SOB and back pain. Pt states that he is out of his anxiety medications. Denies chest pain.

## 2015-03-10 NOTE — ED Provider Notes (Signed)
CSN: [id number]     Arrival date & time 03/10/15  1139 History   First MD Initiated Contact with Patient 03/10/15 1308     Chief Complaint  Patient presents with  . Shortness of Breath  . Anxiety     (Consider location/radiation/quality/duration/timing/severity/associated sxs/prior Treatment) HPI Comments: Patient presents emergency department with chief complaint of anxiety. Patient states that he has been out of his anxiety medications. States he felt like he had a panic attack last night. Describes this as a "light feeling in his chest."  States the symptoms only lasted for short while last night. Denies any additional symptoms today. States that he feels well now, but is slightly agitated because he does not have his anxiety medications. He denies any chest pain, shortness of breath, nausea, or vomiting. States the last night he also had a tension headache, but this is also resolved. States that he is scheduled to pick up his anxiety medications at family practice today. States that he wanted to just be evaluated prior to doing this. He is symptom-free at this time.  The history is provided by the patient. No language interpreter was used.    Past Medical History  Diagnosis Date  . Constipation due to pain medication   . COPD (chronic obstructive pulmonary disease)   . Status post spinal surgery   . Hypertension   . Anxiety   . PE (pulmonary embolism)     DVT left leg  . Tobacco abuse   . 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 Migraine    Past Surgical History  Procedure Laterality Date  . Spinal fixation surgery      cervical   Family History  Problem Relation Age of Onset  . Cancer Mother   . Depression Sister    History  Substance Use Topics  . Smoking status: Current Every [place] Smoker -- 1.00 packs/[place]    Types: Cigarettes  . Smokeless tobacco: Never Used     Comment: decreased to 1ppd  . Alcohol Use: No     Comment: former    Review of  Systems  Constitutional: Negative for fever and chills.  Respiratory: Negative for shortness of breath.   Cardiovascular: Negative for chest pain.  Gastrointestinal: Negative for nausea, vomiting, diarrhea and constipation.  Genitourinary: Negative for dysuria.  Psychiatric/Behavioral: The patient is nervous/anxious.   All other systems reviewed and are negative.     Allergies  Fentanyl and Methadone  Home Medications   Prior to Admission medications   Medication Sig Start Date End Date Taking? Authorizing Provider  albuterol (PROVENTIL HFA;VENTOLIN HFA) 108 (90 BASE) MCG/ACT inhaler Inhale 2 puffs into the lungs every 4 (four) hours as needed for wheezing.    Historical Provider, MD  butalbital-acetaminophen-caffeine (FIORICET, ESGIC) [phone] MG per tablet Take 1 tablet by mouth 2 (two) times daily as needed for headache. 12/31/14   Frazier Richards, MD  cephALEXin (KEFLEX) 500 MG capsule Take 1 capsule (500 mg total) by mouth 4 (four) times daily. 12/30/14   Mariel Aloe, MD  Clotrimazole 1 % OINT Apply twice daily to feet for the next two weeks 12/30/14   Mariel Aloe, MD  cyclobenzaprine (FLEXERIL) 10 MG tablet TAKE ONE TABLET BY MOUTH THREE TIMES DAILY AS NEEDED FOR MUSCLE SPASM 07/21/14   Leone Brand, MD  finasteride (PROSCAR) 5 MG tablet Take 1 tablet (5 mg total) by mouth daily. 04/03/14   Waldemar [name], MD  fluconazole (DIFLUCAN) 150 MG tablet Take 2 tablets by mouth once a week for 18 weeks 04/29/14   Waldemar [name], MD  gabapentin (NEURONTIN) 300 MG capsule TAKE 3 CAPSULES BY MOUTH THREE TIMES DAILY 01/15/15   Leone Brand, MD  hydrochlorothiazide (HYDRODIURIL) 25 MG tablet Take 1 tablet (25 mg total) by mouth daily. 08/28/14   Leone Brand, MD  lisinopril (PRINIVIL,ZESTRIL) 20 MG tablet TAKE 1 TABLET BY MOUTH DAILY 09/19/14   Leone Brand, MD  loratadine (CLARITIN) 10 MG tablet Take 1 tablet (10 mg total) by mouth daily. 05/28/14   Leone Brand, MD  LORazepam  (ATIVAN) 2 MG tablet TAKE 1 TABLET BY MOUTH TWICE DAILY AS NEEDED FOR ANXIETY 03/02/15   Leone Brand, MD  nortriptyline (PAMELOR) 75 MG capsule Take 2 capsules (150 mg total) by mouth at bedtime. 09/19/14   Leone Brand, MD  OXcarbazepine (TRILEPTAL) 150 MG tablet TAKE 1 TABLET BY MOUTH TWICE DAILY FOR 1 WEEK, THEN TAKE 2 TABLETS BY MOUTH TWICE DAILY 12/09/14   Kathrynn Ducking, MD  oxyCODONE-acetaminophen (PERCOCET/ROXICET) 5-325 MG per tablet Take 1 tablet by mouth every 4 (four) hours as needed for severe pain. Fill 30 days after original 04/29/14   Waldemar [name], MD  potassium chloride (K-DUR) 10 MEQ tablet Take 1 tablet (10 mEq total) by mouth daily. 03/10/15   Montine Circle, PA-C  Estill Dooms MISC Apply 1 application topically 2 (two) times daily as needed (leg pain).    Historical Provider, MD  simvastatin (ZOCOR) 20 MG tablet Take 1 tablet (20 mg total) by mouth daily at 6 PM. 09/09/14   Leone Brand, MD  tiotropium (SPIRIVA) 18 MCG inhalation capsule Place 18 mcg into inhaler and inhale daily.    Historical Provider, MD  traMADol (ULTRAM) 50 MG tablet Take 1-2 tablets (50-100 mg total) by mouth every 8 (eight) hours as needed. 04/29/14   Waldemar [name], MD  traZODone (DESYREL) 100 MG tablet Take 1 tablet (100 mg total) by mouth at bedtime. 09/09/14   Leone Brand, MD  XARELTO 20 MG TABS tablet TAKE 1 TABLET BY MOUTH EVERY [place] WITH SUPPER 09/15/14   Leone Brand, MD   BP 129/77 mmHg  Pulse 62  Temp(Src) 98.2 F (36.8 C) (Oral)  Resp 18  Ht 5\' 11"  (1.803 m)  Wt 250 lb (113.399 kg)  BMI 34.88 kg/m2  SpO2 99% Physical Exam  Constitutional: He is oriented to person, place, and time. He appears well-developed and well-nourished.  HENT:  Head: Normocephalic and atraumatic.  Eyes: Conjunctivae and EOM are normal. Pupils are equal, round, and reactive to light. Right eye exhibits no discharge. Left eye exhibits no discharge. No scleral icterus.  Neck: Normal range of motion. Neck  supple. No JVD present.  Cardiovascular: Normal rate, regular rhythm and normal heart sounds.  Exam reveals no gallop and no friction rub.   No murmur heard. Pulmonary/Chest: Effort normal and breath sounds normal. No respiratory distress. He has no wheezes. He has no rales. He exhibits no tenderness.  Abdominal: Soft. He exhibits no distension and no mass. There is no tenderness. There is no rebound and no guarding.  Musculoskeletal: Normal range of motion. He exhibits no edema or tenderness.  Neurological: He is alert and oriented to person, place, and time.  Skin: Skin is warm and dry.  Psychiatric: He has a normal mood and affect. His behavior is normal. Judgment and thought content normal.  Nursing  note and vitals reviewed.   ED Course  Procedures (including critical care time)  Results for orders placed or performed during the hospital encounter of 03/10/15  CBC  Result Value Ref Range   WBC 9.7 4.0 - 10.5 K/uL   RBC 4.11 (L) 4.22 - 5.81 MIL/uL   Hemoglobin 13.4 13.0 - 17.0 g/dL   HCT 38.5 (L) 39.0 - 52.0 %   MCV 93.7 78.0 - 100.0 fL   MCH 32.6 26.0 - 34.0 pg   MCHC 34.8 30.0 - 36.0 g/dL   RDW 12.8 11.5 - 15.5 %   Platelets 228 150 - 400 K/uL  Basic metabolic panel  Result Value Ref Range   Sodium 136 135 - 145 mmol/L   Potassium 3.0 (L) 3.5 - 5.1 mmol/L   Chloride 95 (L) 101 - 111 mmol/L   CO2 29 22 - 32 mmol/L   Glucose, Bld 108 (H) 70 - 99 mg/dL   BUN 15 6 - 20 mg/dL   Creatinine, Ser 0.86 0.61 - 1.24 mg/dL   Calcium 10.2 8.9 - 10.3 mg/dL   GFR calc non Af Amer >60 >60 mL/min   GFR calc Af Amer >60 >60 mL/min   Anion gap 12 5 - 15  I-stat troponin, ED  (not at [facility], [facility])  Result Value Ref Range   Troponin i, poc 0.00 0.00 - 0.08 ng/mL   Comment 3           No results found.    EKG Interpretation None      MDM   Final diagnoses:  Anxiety    Patient with anxiety attack last night, and some residual anxiety today. No ischemic changes on EKG. Labs are  reassuring. Troponin is negative. Patient denies any chest pain or shortness of breath now. States that his symptoms were all last night. States that he has been very stressed because it is the one-year anniversary of his mother's death. States that this is been contributing to his anxiety. States that he needs to get his medications refilled, and is scheduled to do this at family practice today.  Patient requesting to be discharged so that he can go in his medications. His labs all looked relatively reassuring, with the exception of some mild hypokalemia. I will give him a prescription for some potassium, and recommend follow-up with his primary care provider. As is symptom-free, feel that he is stable for discharge.    Montine Circle, PA-C 03/10/15 [place], MD 03/12/15 [phone]

## 2015-03-10 NOTE — ED Notes (Signed)
Pt C/O POSTERIOR NECK PAIN  "FOR A WHILE". DENIES RADIATION TO ARMS. STATES ALSO HAS HAD A "LIGHT FEELING" IN HIS CHEST WITH SOB. PT IS IN NO DISTRESS.

## 2015-03-10 NOTE — Discharge Instructions (Signed)
Panic Attacks Panic attacks are sudden, short-livedsurges of severe anxiety, fear, or discomfort. They may occur for no reason when you are relaxed, when you are anxious, or when you are sleeping. Panic attacks may occur for a number of reasons:   Healthy people occasionally have panic attacks in extreme, life-threatening situations, such as war or natural disasters. Normal anxiety is a protective mechanism of the body that helps us react to danger (fight or flight response).  Panic attacks are often seen with anxiety disorders, such as panic disorder, social anxiety disorder, generalized anxiety disorder, and phobias. Anxiety disorders cause excessive or uncontrollable anxiety. They may interfere with your relationships or other life activities.  Panic attacks are sometimes seen with other mental illnesses, such as depression and posttraumatic stress disorder.  Certain medical conditions, prescription medicines, and drugs of abuse can cause panic attacks. SYMPTOMS  Panic attacks start suddenly, peak within 20 minutes, and are accompanied by four or more of the following symptoms:  Pounding heart or fast heart rate (palpitations).  Sweating.  Trembling or shaking.  Shortness of breath or feeling smothered.  Feeling choked.  Chest pain or discomfort.  Nausea or strange feeling in your stomach.  Dizziness, light-headedness, or feeling like you will faint.  Chills or hot flushes.  Numbness or tingling in your lips or hands and feet.  Feeling that things are not real or feeling that you are not yourself.  Fear of losing control or going crazy.  Fear of dying. Some of these symptoms can mimic serious medical conditions. For example, you may think you are having a heart attack. Although panic attacks can be very scary, they are not life threatening. DIAGNOSIS  Panic attacks are diagnosed through an assessment by your health care provider. Your health care provider will ask  questions about your symptoms, such as where and when they occurred. Your health care provider will also ask about your medical history and use of alcohol and drugs, including prescription medicines. Your health care provider may order blood tests or other studies to rule out a serious medical condition. Your health care provider may refer you to a mental health professional for further evaluation. TREATMENT   Most healthy people who have one or two panic attacks in an extreme, life-threatening situation will not require treatment.  The treatment for panic attacks associated with anxiety disorders or other mental illness typically involves counseling with a mental health professional, medicine, or a combination of both. Your health care provider will help determine what treatment is best for you.  Panic attacks due to physical illness usually go away with treatment of the illness. If prescription medicine is causing panic attacks, talk with your health care provider about stopping the medicine, decreasing the dose, or substituting another medicine.  Panic attacks due to alcohol or drug abuse go away with abstinence. Some adults need professional help in order to stop drinking or using drugs. HOME CARE INSTRUCTIONS   Take all medicines as directed by your health care provider.   Schedule and attend follow-up visits as directed by your health care provider. It is important to keep all your appointments. SEEK MEDICAL CARE IF:  You are not able to take your medicines as prescribed.  Your symptoms do not improve or get worse. SEEK IMMEDIATE MEDICAL CARE IF:   You experience panic attack symptoms that are different than your usual symptoms.  You have serious thoughts about hurting yourself or others.  You are taking medicine for panic attacks and   have a serious side effect. MAKE SURE YOU:  Understand these instructions.  Will watch your condition.  Will get help right away if you are not  doing well or get worse. Document Released: 10/24/2005 Document Revised: 10/29/2013 Document Reviewed: 06/07/2013 ExitCare Patient Information 2015 [employer]. This information is not intended to replace advice given to you by your health care provider. Make sure you discuss any questions you have with your health care provider.  Generalized Anxiety Disorder Generalized anxiety disorder (GAD) is a mental disorder. It interferes with life functions, including relationships, work, and school. GAD is different from normal anxiety, which everyone experiences at some point in their lives in response to specific life events and activities. Normal anxiety actually helps us prepare for and get through these life events and activities. Normal anxiety goes away after the event or activity is over.  GAD causes anxiety that is not necessarily related to specific events or activities. It also causes excess anxiety in proportion to specific events or activities. The anxiety associated with GAD is also difficult to control. GAD can vary from mild to severe. People with severe GAD can have intense waves of anxiety with physical symptoms (panic attacks).  SYMPTOMS The anxiety and worry associated with GAD are difficult to control. This anxiety and worry are related to many life events and activities and also occur more days than not for 6 months or longer. People with GAD also have three or more of the following symptoms (one or more in children):  Restlessness.   Fatigue.  Difficulty concentrating.   Irritability.  Muscle tension.  Difficulty sleeping or unsatisfying sleep. DIAGNOSIS GAD is diagnosed through an assessment by your health care provider. Your health care provider will ask you questions aboutyour mood,physical symptoms, and events in your life. Your health care provider may ask you about your medical history and use of alcohol or drugs, including prescription medicines. Your health care  provider may also do a physical exam and blood tests. Certain medical conditions and the use of certain substances can cause symptoms similar to those associated with GAD. Your health care provider may refer you to a mental health specialist for further evaluation. TREATMENT The following therapies are usually used to treat GAD:   Medication. Antidepressant medication usually is prescribed for long-term daily control. Antianxiety medicines may be added in severe cases, especially when panic attacks occur.   Talk therapy (psychotherapy). Certain types of talk therapy can be helpful in treating GAD by providing support, education, and guidance. A form of talk therapy called cognitive behavioral therapy can teach you healthy ways to think about and react to daily life events and activities.  Stress managementtechniques. These include yoga, meditation, and exercise and can be very helpful when they are practiced regularly. A mental health specialist can help determine which treatment is best for you. Some people see improvement with one therapy. However, other people require a combination of therapies. Document Released: 02/18/2013 Document Revised: 03/10/2014 Document Reviewed: 02/18/2013 ExitCare Patient Information 2015 [employer]. This information is not intended to replace advice given to you by your health care provider. Make sure you discuss any questions you have with your health care provider.  

## 2015-03-16 ENCOUNTER — Other Ambulatory Visit: Payer: Self-pay | Admitting: *Deleted

## 2015-03-16 MED ORDER — LISINOPRIL 20 MG PO TABS: 20.0000 mg | ORAL_TABLET | Freq: Every day | ORAL | Status: DC

## 2015-04-08 ENCOUNTER — Encounter: Payer: Self-pay | Admitting: Family Medicine

## 2015-04-08 ENCOUNTER — Encounter: Payer: Self-pay | Admitting: *Deleted

## 2015-04-08 ENCOUNTER — Telehealth: Payer: Self-pay | Admitting: Family Medicine

## 2015-04-08 ENCOUNTER — Ambulatory Visit (INDEPENDENT_AMBULATORY_CARE_PROVIDER_SITE_OTHER): Payer: Medicaid Other | Admitting: Family Medicine

## 2015-04-08 VITALS — BP 160/91 | HR 73 | Temp 98.0°F | Ht 71.0 in | Wt 239.0 lb

## 2015-04-08 DIAGNOSIS — I10 Essential (primary) hypertension: Secondary | ICD-10-CM

## 2015-04-08 DIAGNOSIS — G5702 Lesion of sciatic nerve, left lower limb: Secondary | ICD-10-CM

## 2015-04-08 DIAGNOSIS — G894 Chronic pain syndrome: Secondary | ICD-10-CM | POA: Diagnosis not present

## 2015-04-08 DIAGNOSIS — F329 Major depressive disorder, single episode, unspecified: Secondary | ICD-10-CM | POA: Diagnosis not present

## 2015-04-08 DIAGNOSIS — F32A Depression, unspecified: Secondary | ICD-10-CM

## 2015-04-08 MED ORDER — LORAZEPAM 2 MG PO TABS: ORAL_TABLET | ORAL | Status: DC

## 2015-04-08 MED ORDER — OXYCODONE-ACETAMINOPHEN 5-325 MG PO TABS: 1.0000 | ORAL_TABLET | ORAL | Status: DC | PRN

## 2015-04-08 MED ORDER — HYDROCHLOROTHIAZIDE 25 MG PO TABS: 25.0000 mg | ORAL_TABLET | Freq: Every day | ORAL | Status: DC

## 2015-04-08 MED ORDER — PREGABALIN 150 MG PO CAPS: 150.0000 mg | ORAL_CAPSULE | Freq: Two times a day (BID) | ORAL | Status: DC

## 2015-04-08 MED ORDER — VENLAFAXINE HCL ER 37.5 MG PO CP24: ORAL_CAPSULE | ORAL | Status: DC

## 2015-04-08 MED ORDER — RIVAROXABAN 20 MG PO TABS: ORAL_TABLET | ORAL | Status: DC

## 2015-04-08 MED ORDER — FINASTERIDE 5 MG PO TABS: 5.0000 mg | ORAL_TABLET | Freq: Every day | ORAL | Status: DC

## 2015-04-08 NOTE — Patient Instructions (Signed)
Continue the nortriptyline to 2 tablet daily for 1 week. Start the venlafaxine 1 tablet today.  After 7 days, decrease the nortriptyline to 1 tablet daily for 1 week, increase venlafaxine to 2 tablets.  At the end of week 2, stop the nortriptyline.

## 2015-04-08 NOTE — Assessment & Plan Note (Signed)
Not at goal, but pt reports not taking medicine today. Will re-check at f/u visit with depression fu

## 2015-04-08 NOTE — Progress Notes (Signed)
   Subjective:    Patient ID: [name] [name], male    DOB: [birth year], 54 y.o.   MRN: [id number]  HPI  CC: pain referral  # Chronic Pain:  Discharge 4/20 from pain clinic. He was anticipating this happening after he established because the clinic had a policy of an immediate dismissal if a random, same Adee visit and pill count was not met (ie clinic would call pt and require them to come in with their pill bottle for a count)... He has only 1 vehicle in the family and wife works far away so he knew this wouldn't work, states he brought it up to the doctor but they stated "we will see when we get there"  Taking oxycodone 6 per [place]. Last increase in pills was 2 months. Also reports taking MS contin twice a [place]. Was getting #180 tablets oxycodone. Has not taken tramadol since last summer  He was switched to lyrica from gabapentin and reports improvement with this change  Wants to establish at new pain clinic ([facility])  # Anxiety/depression  About the same, had an episode where he ran out of his medicine and went to the ED  Would like to go see a therapist/counselor  No thoughts of hurting himself  Does not feel the nortriptyline is helping him and wants to switch  # Hypertension  Reports not taking his medicine this morning  Does not check at home  Review of Systems   See HPI for ROS. All other systems reviewed and are negative.  Past medical history, surgical, family, and social history reviewed and updated in the EMR as appropriate. Objective:  BP 160/91 mmHg  Pulse 73  Temp(Src) 98 F (36.7 C) (Oral)  Ht _0  (1.803 m)  Wt 239 lb (108.41 kg)  BMI 33.35 kg/m2 Vitals and nursing note reviewed  General: NAD CV: RRR, nl s1s2 no mrg, 2+ radial pulses bilaterally Resp: CTAB nl effort Neuro: alert and oriented, no deficits Psych: mood and affect are normal. Thought content is normal.  Assessment & Plan:  See Problem List Documentation

## 2015-04-08 NOTE — Assessment & Plan Note (Signed)
Pt reports improved with lyrica. Had previously been on gabapentin without much relief. Refill sent for lyrica.

## 2015-04-08 NOTE — Telephone Encounter (Signed)
Pt forgot to get a list of therapist that accept medicaid while he was here today, wants to know if that list can be mailed to him?

## 2015-04-08 NOTE — Progress Notes (Signed)
Prior Authorization received from [employer] for Lyrica 150 mg. Formulary and PA form placed in provider box for completion. Derl Barrow, RN

## 2015-04-08 NOTE — Assessment & Plan Note (Signed)
Pt dismissed from current pain clinic because of inability to get a ride to a same [place] requested pill count visit near the end of May. New referral sent for clinic that pt found himself. Refill sent for oxycodone #180. Discussed would not continue this prescription indefinitely.

## 2015-04-08 NOTE — Telephone Encounter (Signed)
Will forward to MD because he was going to send it through mychart. [name] [name],CMA

## 2015-04-08 NOTE — Assessment & Plan Note (Signed)
Not controlled. Pt requests change. Cross-titrate nortriptyline with venlafaxine (noted hypertension) for possible duel benefit with neuropathy. F/u 6 weeks.

## 2015-04-09 NOTE — Telephone Encounter (Signed)
I sent him a mychart message yesterday afternoon after he had called.

## 2015-04-09 NOTE — Progress Notes (Signed)
I was preceptor the [place] of this visit.   

## 2015-04-20 ENCOUNTER — Telehealth: Payer: Self-pay | Admitting: *Deleted

## 2015-04-20 NOTE — Telephone Encounter (Signed)
Prior Authorization received from [employer] for [place]. Formulary and PA form placed in provider box for completion. Derl Barrow, RN

## 2015-04-22 NOTE — Telephone Encounter (Signed)
PA Form filled out and left with Tamika.

## 2015-04-22 NOTE — Telephone Encounter (Signed)
Received PA approval for Lyrica via [employer].  Med approved for 04/22/15 - 04/21/16.  [facility] pharmacy informed.  PA approval number [id number]. Derl Barrow, RN

## 2015-05-06 ENCOUNTER — Ambulatory Visit (INDEPENDENT_AMBULATORY_CARE_PROVIDER_SITE_OTHER): Payer: Medicaid Other | Admitting: Family Medicine

## 2015-05-06 ENCOUNTER — Encounter: Payer: Self-pay | Admitting: Family Medicine

## 2015-05-06 VITALS — BP 129/62 | HR 64 | Temp 98.3°F | Ht 71.0 in | Wt 245.3 lb

## 2015-05-06 DIAGNOSIS — F411 Generalized anxiety disorder: Secondary | ICD-10-CM

## 2015-05-06 DIAGNOSIS — Z720 Tobacco use: Secondary | ICD-10-CM | POA: Diagnosis not present

## 2015-05-06 DIAGNOSIS — G5702 Lesion of sciatic nerve, left lower limb: Secondary | ICD-10-CM

## 2015-05-06 DIAGNOSIS — F329 Major depressive disorder, single episode, unspecified: Secondary | ICD-10-CM

## 2015-05-06 DIAGNOSIS — F32A Depression, unspecified: Secondary | ICD-10-CM

## 2015-05-06 DIAGNOSIS — Z716 Tobacco abuse counseling: Secondary | ICD-10-CM | POA: Diagnosis not present

## 2015-05-06 MED ORDER — LORAZEPAM 2 MG PO TABS: ORAL_TABLET | ORAL | Status: DC

## 2015-05-06 MED ORDER — VENLAFAXINE HCL ER 75 MG PO CP24: ORAL_CAPSULE | ORAL | Status: DC

## 2015-05-06 MED ORDER — HYDROCORTISONE 2.5 % EX OINT: TOPICAL_OINTMENT | Freq: Two times a day (BID) | CUTANEOUS | Status: DC

## 2015-05-06 MED ORDER — OXYCODONE-ACETAMINOPHEN 5-325 MG PO TABS: 1.0000 | ORAL_TABLET | ORAL | Status: DC | PRN

## 2015-05-06 NOTE — Assessment & Plan Note (Signed)
Improved. PHQ9 7/somewhat difficult. Will increase effexor XR to 75mg  daily, okay for patient to titrate up to 150mg . F/u 1 month.

## 2015-05-06 NOTE — Assessment & Plan Note (Signed)
Stable. Refill for ativan #60. After depression is better controlled will discuss titrating down.

## 2015-05-06 NOTE — Assessment & Plan Note (Signed)
Prior auth for lyrica approved after last visit. Symptoms are improved a little bit while on effexor. F/u as needed.

## 2015-05-06 NOTE — Patient Instructions (Addendum)
We will send your information to the [facility] pain management.  If you want to, you can increase the venlafaxine to 150mg  (2 tablets) in 2 weeks. If you think you are controlled on 75mg  it is okay to stay at that dose as well.

## 2015-05-06 NOTE — Progress Notes (Signed)
   Subjective:    Patient ID: [name] [name], male    DOB: 03/21/1961, 54 y.o.   MRN: [id number]  HPI  CC: follow up  # Anxiety, depression:  Feels the effexor has been helping him  No major side effects from the medicine that he can tell  PHQ9 7, somewhat difficult  No SI/HI  # Chronic low pain, neuropathy  Needs information release to give records to [facility] pain clinic  Stable/some improvement with effexor  Feels he is able to walk better over past month ROS: no bowel/bladder incontinence  # Tobacco use  Continues to smoke less, no <1ppd when he was >2ppd  Does not want nicotine replacement  Review of Systems   See HPI for ROS. All other systems reviewed and are negative.  Past medical history, surgical, family, and social history reviewed and updated in the EMR as appropriate. Objective:  BP 129/62 mmHg  Pulse 64  Temp(Src) 98.3 F (36.8 C) (Oral)  Ht 5\' 11"  (1.803 m)  Wt 245 lb 5 oz (111.273 kg)  BMI 34.23 kg/m2 Vitals and nursing note reviewed  General: NAD CV: RRR, nl s1s2 no mrg Resp: CTAB Psych: mood normal, affect congruent. Thought content normal.  Assessment & Plan:  See Problem List Documentation

## 2015-05-06 NOTE — Assessment & Plan Note (Signed)
Continued encouragement of cutting back. In progress of quitting. Will continue to offer NRT. On effexor so would not add wellbutrin.

## 2015-06-03 ENCOUNTER — Encounter: Payer: Self-pay | Admitting: Family Medicine

## 2015-06-03 ENCOUNTER — Ambulatory Visit (INDEPENDENT_AMBULATORY_CARE_PROVIDER_SITE_OTHER): Payer: Medicaid Other | Admitting: Family Medicine

## 2015-06-03 VITALS — BP 182/90 | HR 62 | Temp 99.0°F | Ht 71.0 in | Wt 253.0 lb

## 2015-06-03 DIAGNOSIS — F32A Depression, unspecified: Secondary | ICD-10-CM

## 2015-06-03 DIAGNOSIS — G894 Chronic pain syndrome: Secondary | ICD-10-CM | POA: Diagnosis not present

## 2015-06-03 DIAGNOSIS — F411 Generalized anxiety disorder: Secondary | ICD-10-CM

## 2015-06-03 DIAGNOSIS — R238 Other skin changes: Secondary | ICD-10-CM | POA: Diagnosis not present

## 2015-06-03 DIAGNOSIS — Z720 Tobacco use: Secondary | ICD-10-CM | POA: Diagnosis not present

## 2015-06-03 DIAGNOSIS — F329 Major depressive disorder, single episode, unspecified: Secondary | ICD-10-CM

## 2015-06-03 DIAGNOSIS — T148XXA Other injury of unspecified body region, initial encounter: Secondary | ICD-10-CM | POA: Insufficient documentation

## 2015-06-03 MED ORDER — VENLAFAXINE HCL ER 150 MG PO CP24: ORAL_CAPSULE | ORAL | Status: DC

## 2015-06-03 MED ORDER — OXYCODONE-ACETAMINOPHEN 10-325 MG PO TABS: 1.0000 | ORAL_TABLET | ORAL | Status: DC | PRN

## 2015-06-03 MED ORDER — LORAZEPAM 2 MG PO TABS: ORAL_TABLET | ORAL | Status: DC
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
## 2015-06-03 NOTE — Assessment & Plan Note (Signed)
Refill lorazepam 2mg  #60 given. F/u 1 month.

## 2015-06-03 NOTE — Assessment & Plan Note (Signed)
On 1ppd and does not express interest in quitting today.

## 2015-06-03 NOTE — Assessment & Plan Note (Signed)
Right lateral abdomen. Does not appear infected. It is not currently open, rather it has a fingernail shaped scab. Asked to return to clinic if worsens and not try to self drain so that a provider can look at it. F/u as needed.

## 2015-06-03 NOTE — Assessment & Plan Note (Signed)
Worsened pain. Has been getting percocet 5-325 #180 (1-2 tabs q4hrs prn) from [facility] since being [facility] from [facility]. Trying to get into [facility] pain mgmt. [facility] request from [facility] to send these records to [facility], pt states he will also call this afternoon to get his records sent. Increased percocet 10-325 #180 temporarily (though he has been on this dose from [facility]). F/u 1 month.
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
## 2015-06-23 ENCOUNTER — Encounter: Payer: Self-pay | Admitting: Family Medicine

## 2015-06-23 ENCOUNTER — Ambulatory Visit (INDEPENDENT_AMBULATORY_CARE_PROVIDER_SITE_OTHER): Payer: Medicaid Other | Admitting: Family Medicine

## 2015-06-23 ENCOUNTER — Telehealth: Payer: Self-pay | Admitting: Family Medicine

## 2015-06-23 VITALS — BP 156/92 | HR 86 | Ht 71.0 in | Wt 245.0 lb

## 2015-06-23 DIAGNOSIS — F32A Depression, unspecified: Secondary | ICD-10-CM

## 2015-06-23 DIAGNOSIS — G894 Chronic pain syndrome: Secondary | ICD-10-CM | POA: Diagnosis not present

## 2015-06-23 DIAGNOSIS — I10 Essential (primary) hypertension: Secondary | ICD-10-CM

## 2015-06-23 DIAGNOSIS — F329 Major depressive disorder, single episode, unspecified: Secondary | ICD-10-CM | POA: Diagnosis not present

## 2015-06-23 DIAGNOSIS — F411 Generalized anxiety disorder: Secondary | ICD-10-CM | POA: Diagnosis not present

## 2015-06-23 MED ORDER — BUTALBITAL-APAP-CAFFEINE 50-325-40 MG PO TABS: 1.0000 | ORAL_TABLET | Freq: Two times a day (BID) | ORAL | Status: DC | PRN

## 2015-06-23 MED ORDER — OXYCODONE-ACETAMINOPHEN 10-325 MG PO TABS: 1.0000 | ORAL_TABLET | ORAL | Status: DC | PRN

## 2015-06-23 MED ORDER — LORAZEPAM 2 MG PO TABS: ORAL_TABLET | ORAL | Status: DC

## 2015-06-23 NOTE — Telephone Encounter (Signed)
Kimberly from "pain management" is returning Jazmin's phone call. Sadie Reynolds, ASA

## 2015-06-23 NOTE — Progress Notes (Signed)
Patient signed a new release of information for [facility] [phone] and fax [phone].  Spoke with Joelene Millin at [facility] and Judson Roch is in charge of medical records and fax should be to her attention.  ROI faxed to [phone].  Called and LM for Judson Roch on medical records voicemail to please contact me on status of release.  Also called and spoke with Jinny Blossom at [facility] who stated the "person covering for Judson Roch today is working on it."  So I confirmed that fax was received.  Will plan to call back tomorrow and check on this again. Jazmin [name],CMA

## 2015-06-23 NOTE — Progress Notes (Signed)
   Subjective:    Patient ID: [name] [name], male    DOB: Aug 22, 1961, 54 y.o.   MRN: [id number]  HPI  CC: follow up  # Chronic pain:  Our office still has not received records from [facility]. He is attempting to get these to establish with [facility] pain clinic  Pain is currently stable on medicines, he was increased back to percocet 10mg  tablets that he had previously been on with [facility]  # Anxiety/depression  Stable, he says maybe slightly worse with frustrations of getting his records  Mood is improved with the increase in effexor  PHQ9 15/somewhat ROS: no SI or HI  # Hypertension:  Taking medicines as prescribed, no missed doses  Not at goal today ROS: no CP, no SOB  Review of Systems   See HPI for ROS.   Past medical history, surgical, family, and social history reviewed and updated in the EMR as appropriate. Objective:  BP 156/92 mmHg  Pulse 86  Ht 5\' 11"  (1.803 m)  Wt 245 lb (111.131 kg)  BMI 34.19 kg/m2 Vitals and nursing note reviewed  General: NAD CV: RRR, normal heart sounds, no mrg 2+ radial PT pulses bilaterally Resp: CTAB nl effort Psych: mood "angry". Normal thought content and speech. No SI/HI.  Assessment & Plan:  Chronic pain syndrome Stable currently. 3rd attempt at getting records from [facility] so that these can be given to him to bring to new pain clinic which he is trying to establish with [facility]). Refill oxy-apap 10-325 #180 today. F/u 1 month.  Depression Improved on effexor 150mg . Continue this dose. F/u 1 month.  HTN (hypertension) Not at goal today. Regimen is lisinopril 20mg , HCTZ 25mg . At next visit will add amlodipine or BB. F/u 1 month.  Anxiety state Stable, maybe slightly worse with pain mgmt situation. Refill ativan 2mg  #60 refill 3. F/u 3 months

## 2015-06-24 NOTE — Progress Notes (Signed)
Spoke with Maudie Mercury and they have faxed records and received confirmation that it was received.  Checked the fax machine and provider's box and didn't see any records.  Will wait and check again since fax may not have come through yet.  Jazmin [name],CMA

## 2015-06-24 NOTE — Telephone Encounter (Signed)
Spoke with [facility]audie Mercury and they have faxed records and received confirmation that it was received.  Checked the fax machine and provider's box and didn't see any records.  Will wait and check again since fax may not have come through yet.  Lathan Gieselman,CMA

## 2015-06-25 NOTE — Progress Notes (Signed)
Notes received from fax per Tia.  Will make a copy and place up front for patient and get a copy scanned into epic.  Will wait to hear back from patient so I know where to send these notes too.  Jazmin [name],CMA

## 2015-06-25 NOTE — Telephone Encounter (Signed)
[name] [name],  I placed these records on you desk.

## 2015-06-25 NOTE — Progress Notes (Signed)
Spoke with [name] [name] and informed her that records were never received yesterday.  She states that they were faxed twice but will fax them again to my attention.  Will check fax machine after lunch.  Alaisha Eversley,CMA

## 2015-06-26 NOTE — Assessment & Plan Note (Signed)
Not at goal today. Regimen is lisinopril 20mg , HCTZ 25mg . At next visit will add amlodipine or BB. F/u 1 month.

## 2015-06-26 NOTE — Assessment & Plan Note (Signed)
Stable, maybe slightly worse with pain mgmt situation. Refill ativan 2mg  #60 refill 3. F/u 3 months

## 2015-06-26 NOTE — Assessment & Plan Note (Signed)
Improved on effexor 150mg . Continue this dose. F/u 1 month.

## 2015-06-26 NOTE — Assessment & Plan Note (Addendum)
Stable currently. 3rd attempt at getting records from [facility] so that these can be given to him to bring to new pain clinic which he is trying to establish with [facility]). Refill oxy-apap 10-325 #180 today. F/u 1 month.

## 2015-07-01 ENCOUNTER — Telehealth: Payer: Self-pay | Admitting: Family Medicine

## 2015-07-01 MED ORDER — OXYCODONE-ACETAMINOPHEN 10-325 MG PO TABS: 1.0000 | ORAL_TABLET | ORAL | Status: DC | PRN

## 2015-07-01 NOTE — Telephone Encounter (Addendum)
Rx for 2 Roe[place] early refill printed. Rx from last office visit was given by to [place] byt the patient, new rx was handed to patient in waiting room. -Dr. Lamar Benes

## 2015-07-01 NOTE — Telephone Encounter (Signed)
Pt is due for his percocet refill on the 26th but is going out of town, wants to leave asap, wants to know if he can get it early?

## 2015-07-01 NOTE — Telephone Encounter (Signed)
Refill request from pt. Will forward to PCP for review. Tynesha Free, CMA. 

## 2015-07-15 ENCOUNTER — Other Ambulatory Visit: Payer: Self-pay | Admitting: *Deleted

## 2015-07-15 MED ORDER — LORATADINE 10 MG PO TABS: 10.0000 mg | ORAL_TABLET | Freq: Every day | ORAL | Status: DC

## 2015-07-21 ENCOUNTER — Ambulatory Visit (INDEPENDENT_AMBULATORY_CARE_PROVIDER_SITE_OTHER): Payer: Medicaid Other | Admitting: Family Medicine

## 2015-07-21 ENCOUNTER — Encounter: Payer: Self-pay | Admitting: Family Medicine

## 2015-07-21 VITALS — BP 150/92 | HR 63 | Temp 98.2°F | Ht 71.0 in | Wt 243.1 lb

## 2015-07-21 DIAGNOSIS — M25561 Pain in right knee: Secondary | ICD-10-CM

## 2015-07-21 DIAGNOSIS — I10 Essential (primary) hypertension: Secondary | ICD-10-CM | POA: Diagnosis not present

## 2015-07-21 DIAGNOSIS — M21372 Foot drop, left foot: Secondary | ICD-10-CM

## 2015-07-21 DIAGNOSIS — Z23 Encounter for immunization: Secondary | ICD-10-CM

## 2015-07-21 DIAGNOSIS — G894 Chronic pain syndrome: Secondary | ICD-10-CM

## 2015-07-21 LAB — BASIC METABOLIC PANEL WITH GFR
BUN: 17 mg/dL (ref 7–25)
CHLORIDE: 104 mmol/L (ref 98–110)
CO2: 26 mmol/L (ref 20–31)
Calcium: 9.3 mg/dL (ref 8.6–10.3)
Creat: 0.73 mg/dL (ref 0.70–1.33)
Glucose, Bld: 94 mg/dL (ref 65–99)
POTASSIUM: 5.3 mmol/L (ref 3.5–5.3)
Sodium: 139 mmol/L (ref 135–146)

## 2015-07-21 MED ORDER — OXYCODONE-ACETAMINOPHEN 10-325 MG PO TABS: 1.0000 | ORAL_TABLET | ORAL | Status: DC | PRN

## 2015-07-21 MED ORDER — LISINOPRIL 40 MG PO TABS: 40.0000 mg | ORAL_TABLET | Freq: Every day | ORAL | Status: DC

## 2015-07-21 NOTE — Assessment & Plan Note (Signed)
Foot drop and calf weakness an issue for him. 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 He has medicaid so PT won't be covered (did not confirm if he already had 3 visits this year or not). Will continue to monitor. He also reported stopping the trileptal.

## 2015-07-21 NOTE — Assessment & Plan Note (Signed)
Not at goal. Increase lisinopril to 40mg , check bmet. Will need bmet at f/u visit as well in 4 weeks.

## 2015-07-21 NOTE — Assessment & Plan Note (Signed)
Current regimen not controlling his pain. Received records from [facility] with 2 months that seem to be missing (December and January visits). Per reviewing these notes it appears that they had talked to him about a negative UDS but patient stated he was never told about this. Referral to [place] pain clinic was declined. Pt prefers to try and find a pain clinic in [place], will call clinic if he finds anything; we did discuss possibly a clinic in [facility]. F/u 1 month. Refill oxy-apap 10-325 #180 refill 0 given today.

## 2015-07-21 NOTE — Progress Notes (Signed)
   Subjective:    Patient ID: [name] [name], male    DOB: 17-Jan-1961, 54 y.o.   MRN: [id number]  HPI  CC: follow up  # Chronic pain secondary to low back, sciatica, neck, knee pain:  Previously seem/dismissed from [facility], [facility]  Records reviewed from [facility], mention in note on 02/05/15 that "Most recent UDS was inappropriately negative for all substances - this was discussed at previous visit" -- previous visit was 01/08/15 with no mention of this UDS being negative. He says that [facility] had never spoken with him about this UDS; also says he hadn't run out of medicine so he is unsure why this test was negative.   Referral to [facility] was declined. Pt says he will look for other practices around [place] area  Pain is still not well controlled, says he had almost been to the point of being functional before being discharged from [facility]  Having worse pain in his right knee as well  # Left calf wasting/foot drop  Concerned about his calf muscle being small/weak  History of foot drop and left sciatica after a fall  Calf is somewhat tender but not currently swollen  # Hypertension  Taking lisinopril 20, hctz 25  No missed doses ROS: no HA, no CP, no SOB  SH: current everyday smoker  Review of Systems   See HPI for ROS.   Past medical history, surgical, family, and social history reviewed and updated in the EMR as appropriate. Objective:  BP 150/92 mmHg  Pulse 63  Temp(Src) 98.2 F (36.8 C) (Oral)  Ht 5\' 11"  (1.803 m)  Wt 243 lb 1 oz (110.252 kg)  BMI 33.92 kg/m2 Vitals and nursing note reviewed  General: appears stressed CV: RRR, normal s1s2, no mrg, 2+ radial pulses Resp: CTAB, normal effort Ext: left calf with some muscle bulk loss compared to the right. Right knee TTP anteriorly and medially.   Assessment & Plan:  Chronic pain syndrome Current regimen not controlling his pain. Received records from [facility] with 2 months that seem to be missing  (December and January visits). Per reviewing these notes it appears that they had talked to him about a negative UDS but patient stated he was never told about this. Referral to [place] pain clinic was declined. Pt prefers to try and find a pain clinic in [place], will call clinic if he finds anything; we did discuss possibly a clinic in [facility]. F/u 1 month. Refill oxy-apap 10-325 #180 refill 0 given today.  HTN (hypertension) Not at goal. Increase lisinopril to 40mg , check bmet. Will need bmet at f/u visit as well in 4 weeks.  Left foot drop Foot drop and calf weakness an issue for him. 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 He has medicaid so PT won't be covered (did not confirm if he already had 3 visits this year or not). Will continue to monitor. He also reported stopping the trileptal.   Right knee pain Check standing x-rays.

## 2015-07-21 NOTE — Assessment & Plan Note (Signed)
Check standing x-rays.

## 2015-07-29 ENCOUNTER — Other Ambulatory Visit: Payer: Self-pay | Admitting: Family Medicine

## 2015-08-10 ENCOUNTER — Other Ambulatory Visit: Payer: Self-pay | Admitting: *Deleted

## 2015-08-14 MED ORDER — PREGABALIN 150 MG PO CAPS: 150.0000 mg | ORAL_CAPSULE | Freq: Two times a day (BID) | ORAL | Status: DC

## 2015-08-14 NOTE — Telephone Encounter (Signed)
Rx phoned in, disp #60 refill 3. Can you call the patient and let him know? Thanks.

## 2015-08-21 ENCOUNTER — Ambulatory Visit (INDEPENDENT_AMBULATORY_CARE_PROVIDER_SITE_OTHER): Payer: Medicaid Other | Admitting: Family Medicine

## 2015-08-21 ENCOUNTER — Encounter: Payer: Self-pay | Admitting: Family Medicine

## 2015-08-21 VITALS — BP 123/79 | HR 76 | Temp 97.9°F | Ht 71.0 in | Wt 230.7 lb

## 2015-08-21 DIAGNOSIS — M5442 Lumbago with sciatica, left side: Secondary | ICD-10-CM | POA: Diagnosis not present

## 2015-08-21 DIAGNOSIS — G8929 Other chronic pain: Secondary | ICD-10-CM

## 2015-08-21 DIAGNOSIS — G894 Chronic pain syndrome: Secondary | ICD-10-CM | POA: Diagnosis not present

## 2015-08-21 DIAGNOSIS — Z1159 Encounter for screening for other viral diseases: Secondary | ICD-10-CM | POA: Diagnosis not present

## 2015-08-21 DIAGNOSIS — I10 Essential (primary) hypertension: Secondary | ICD-10-CM

## 2015-08-21 LAB — BASIC METABOLIC PANEL WITH GFR
BUN: 16 mg/dL (ref 7–25)
CHLORIDE: 102 mmol/L (ref 98–110)
CO2: 26 mmol/L (ref 20–31)
CREATININE: 0.92 mg/dL (ref 0.70–1.33)
Calcium: 9.5 mg/dL (ref 8.6–10.3)
GFR, Est African American: >89 mL/min (ref >=60)
GFR, Est Non African American: >89 mL/min (ref >=60)
GLUCOSE: 96 mg/dL (ref 65–99)
Potassium: 4.5 mmol/L (ref 3.5–5.3)
Sodium: 139 mmol/L (ref 135–146)

## 2015-08-21 MED ORDER — TRAZODONE HCL 100 MG PO TABS: 100.0000 mg | ORAL_TABLET | Freq: Every day | ORAL | Status: DC

## 2015-08-21 MED ORDER — OXYCODONE-ACETAMINOPHEN 10-325 MG PO TABS: 1.0000 | ORAL_TABLET | ORAL | Status: DC | PRN

## 2015-08-21 NOTE — Assessment & Plan Note (Signed)
At goal on increased lisinopril. BMP from before increase was normal. Will check BMP again today. Follow up 3 months.

## 2015-08-21 NOTE — Assessment & Plan Note (Signed)
Worsening with some sciatic symptoms on the right now. Unable to find clinic closer to where he lives in [place], he is okay to start looking further away (spoke about possibility of [place] area). Will send referral for any pain mgmt clinics in [place]. Refill given percocet 10-325 #180 refill 0. Will get UDS at next visit. Follow up 1 month.

## 2015-08-21 NOTE — Progress Notes (Signed)
   Subjective:    Patient ID: [name] [name], male    DOB: [birth year], 54 y.o.   MRN: [id number]  HPI  CC: follow up   # Chronic pain/sciatica:  Unable to find pain clinic in [place], the practice he was thinking of closed down.   Pain is 8/10  Left leg bothering him the worst currently.   Also having some tingling in toes of right foot that comes and goes.   Uses inversion table and it seems to help some ROS: no fevers/chills, +weakness (chronically left leg)  # Hypertension  No concerns about increase in lisinopril  Taking medication as prescribed  No side effects ROS: no HA, no CP, no SOB  # Healthcare maintenance  Wants to wait on colonoscopy still, girlfriend just got a new job and doesn't think he would be able to get a ride for the procedure yet  Okay to get hep C  Social Hx: current smoker  Review of Systems   See HPI for ROS.   Past medical history, surgical, family, and social history reviewed and updated in the EMR as appropriate. Objective:  BP 123/79 mmHg  Pulse 76  Temp(Src) 97.9 F (36.6 C) (Oral)  Ht 5\' 11"  (1.803 m)  Wt 230 lb 11.2 oz (104.645 kg)  BMI 32.19 kg/m2 Vitals and nursing note reviewed  General: NAD HEENT: PERRL, EOMI, no oropharyngeal lesions CV: RRR, normal s1s2, no murmurs/rub/gallop, 2+ radial pulses bilaterally  Resp: clear to auscultation bilaterally, normal effort Ext: no edema. Left calf atrophied, some tenderness to palpation. Neuro: alert/oriented, no deficits. Gait is assisted with rolling walker, otherwise normal Psych: mood normal and congruent affect. Normal thought content and speech.   Assessment & Plan:  Chronic pain syndrome Worsening with some sciatic symptoms on the right now. Unable to find clinic closer to where he lives in [place], he is okay to start looking further away (spoke about possibility of [place] area). Will send referral for any pain mgmt clinics in [place]. Refill given percocet 10-325 #180  refill 0. Will get UDS at next visit. Follow up 1 month.  HTN (hypertension) At goal on increased lisinopril. BMP from before increase was normal. Will check BMP again today. Follow up 3 months.  Healthcare maintenance Check Hep C screen today. Continue to discuss colonoscopy at follow up visits.

## 2015-08-21 NOTE — Patient Instructions (Signed)
We will re-check electrolytes/kidney function on the new dose of lisinopril. We will also do a one time screening for hepatitis C since you were born between [birth year].  We will send a referral to the pain clinic in [facility].

## 2015-08-22 LAB — HEPATITIS C ANTIBODY: HCV AB: NEGATIVE

## 2015-09-02 ENCOUNTER — Other Ambulatory Visit: Payer: Self-pay | Admitting: Family Medicine

## 2015-09-18 ENCOUNTER — Emergency Department (HOSPITAL_COMMUNITY): Payer: Medicaid Other

## 2015-09-18 ENCOUNTER — Other Ambulatory Visit: Payer: Self-pay

## 2015-09-18 ENCOUNTER — Encounter (HOSPITAL_COMMUNITY): Payer: Self-pay | Admitting: Family Medicine

## 2015-09-18 ENCOUNTER — Inpatient Hospital Stay (HOSPITAL_COMMUNITY)
Admission: EM | Admit: 2015-09-18 | Discharge: 2015-09-21 | DRG: 064 | Disposition: A | Discharge status: Home Health | Payer: Medicaid Other | Attending: Family Medicine | Admitting: Family Medicine

## 2015-09-18 ENCOUNTER — Inpatient Hospital Stay (HOSPITAL_COMMUNITY): Payer: Medicaid Other

## 2015-09-18 ENCOUNTER — Encounter: Payer: Self-pay | Admitting: Family Medicine

## 2015-09-18 ENCOUNTER — Ambulatory Visit (INDEPENDENT_AMBULATORY_CARE_PROVIDER_SITE_OTHER): Payer: Medicaid Other | Admitting: Family Medicine

## 2015-09-18 VITALS — BP 153/97 | HR 75 | Temp 98.3°F | Ht 71.0 in

## 2015-09-18 DIAGNOSIS — F329 Major depressive disorder, single episode, unspecified: Secondary | ICD-10-CM | POA: Diagnosis present

## 2015-09-18 DIAGNOSIS — I63412 Cerebral infarction due to embolism of left middle cerebral artery: Principal | ICD-10-CM

## 2015-09-18 DIAGNOSIS — Z7902 Long term (current) use of antithrombotics/antiplatelets: Secondary | ICD-10-CM | POA: Diagnosis not present

## 2015-09-18 DIAGNOSIS — N179 Acute kidney failure, unspecified: Secondary | ICD-10-CM | POA: Diagnosis present

## 2015-09-18 DIAGNOSIS — I63419 Cerebral infarction due to embolism of unspecified middle cerebral artery: Secondary | ICD-10-CM

## 2015-09-18 DIAGNOSIS — M6289 Other specified disorders of muscle: Secondary | ICD-10-CM

## 2015-09-18 DIAGNOSIS — W19XXXA Unspecified fall, initial encounter: Secondary | ICD-10-CM | POA: Insufficient documentation

## 2015-09-18 DIAGNOSIS — F17210 Nicotine dependence, cigarettes, uncomplicated: Secondary | ICD-10-CM | POA: Diagnosis present

## 2015-09-18 DIAGNOSIS — R29810 Facial weakness: Secondary | ICD-10-CM | POA: Diagnosis present

## 2015-09-18 DIAGNOSIS — J449 Chronic obstructive pulmonary disease, unspecified: Secondary | ICD-10-CM | POA: Diagnosis present

## 2015-09-18 DIAGNOSIS — Z86711 Personal history of pulmonary embolism: Secondary | ICD-10-CM

## 2015-09-18 DIAGNOSIS — G894 Chronic pain syndrome: Secondary | ICD-10-CM | POA: Diagnosis not present

## 2015-09-18 DIAGNOSIS — I639 Cerebral infarction, unspecified: Secondary | ICD-10-CM | POA: Diagnosis present

## 2015-09-18 DIAGNOSIS — E785 Hyperlipidemia, unspecified: Secondary | ICD-10-CM | POA: Diagnosis present

## 2015-09-18 DIAGNOSIS — F17200 Nicotine dependence, unspecified, uncomplicated: Secondary | ICD-10-CM | POA: Diagnosis not present

## 2015-09-18 DIAGNOSIS — Z720 Tobacco use: Secondary | ICD-10-CM | POA: Insufficient documentation

## 2015-09-18 DIAGNOSIS — Z86718 Personal history of other venous thrombosis and embolism: Secondary | ICD-10-CM

## 2015-09-18 DIAGNOSIS — R4701 Aphasia: Secondary | ICD-10-CM | POA: Diagnosis present

## 2015-09-18 DIAGNOSIS — M5432 Sciatica, left side: Secondary | ICD-10-CM | POA: Diagnosis present

## 2015-09-18 DIAGNOSIS — IMO0002 Reserved for concepts with insufficient information to code with codable children: Secondary | ICD-10-CM | POA: Insufficient documentation

## 2015-09-18 DIAGNOSIS — R200 Anesthesia of skin: Secondary | ICD-10-CM

## 2015-09-18 DIAGNOSIS — R202 Paresthesia of skin: Secondary | ICD-10-CM

## 2015-09-18 DIAGNOSIS — E876 Hypokalemia: Secondary | ICD-10-CM | POA: Diagnosis not present

## 2015-09-18 DIAGNOSIS — Z9181 History of falling: Secondary | ICD-10-CM

## 2015-09-18 DIAGNOSIS — H538 Other visual disturbances: Secondary | ICD-10-CM | POA: Diagnosis present

## 2015-09-18 DIAGNOSIS — Z9114 Patient's other noncompliance with medication regimen: Secondary | ICD-10-CM | POA: Diagnosis not present

## 2015-09-18 DIAGNOSIS — I10 Essential (primary) hypertension: Secondary | ICD-10-CM | POA: Diagnosis present

## 2015-09-18 DIAGNOSIS — R531 Weakness: Secondary | ICD-10-CM

## 2015-09-18 DIAGNOSIS — I69320 Aphasia following cerebral infarction: Secondary | ICD-10-CM | POA: Diagnosis not present

## 2015-09-18 DIAGNOSIS — F411 Generalized anxiety disorder: Secondary | ICD-10-CM | POA: Diagnosis present

## 2015-09-18 DIAGNOSIS — I618 Other nontraumatic intracerebral hemorrhage: Secondary | ICD-10-CM | POA: Diagnosis not present

## 2015-09-18 DIAGNOSIS — M21372 Foot drop, left foot: Secondary | ICD-10-CM | POA: Diagnosis present

## 2015-09-18 DIAGNOSIS — I6340 Cerebral infarction due to embolism of unspecified cerebral artery: Secondary | ICD-10-CM

## 2015-09-18 HISTORY — DX: Cerebral infarction, unspecified: I63.9

## 2015-09-18 LAB — URINE MICROSCOPIC-ADD ON

## 2015-09-18 LAB — URINALYSIS, ROUTINE W REFLEX MICROSCOPIC
Bilirubin Urine: NEGATIVE
Glucose, UA: NEGATIVE mg/dL
Ketones, ur: 15 mg/dL — AB
LEUKOCYTES UA: NEGATIVE
Nitrite: NEGATIVE
PROTEIN: NEGATIVE mg/dL
Specific Gravity, Urine: 1.019 (ref 1.005–1.030)
UROBILINOGEN UA: 0.2 mg/dL (ref 0.0–1.0)
pH: 5 (ref 5.0–8.0)

## 2015-09-18 LAB — I-STAT CHEM 8, ED
BUN: 28 mg/dL — ABNORMAL HIGH (ref 6–20)
CALCIUM ION: 1.11 mmol/L — AB (ref 1.12–1.23)
CHLORIDE: 105 mmol/L (ref 101–111)
Creatinine, Ser: 1.6 mg/dL — ABNORMAL HIGH (ref 0.61–1.24)
Glucose, Bld: 92 mg/dL (ref 65–99)
HCT: 52 % (ref 39.0–52.0)
HEMOGLOBIN: 17.7 g/dL — AB (ref 13.0–17.0)
Potassium: 3.9 mmol/L (ref 3.5–5.1)
SODIUM: 139 mmol/L (ref 135–145)
TCO2: 22 mmol/L (ref 0–100)

## 2015-09-18 LAB — DIFFERENTIAL
BASOS PCT: 0 %
Basophils Absolute: 0 10*3/uL (ref 0.0–0.1)
EOS PCT: 0 %
Eosinophils Absolute: 0 10*3/uL (ref 0.0–0.7)
Lymphocytes Relative: 18 %
Lymphs Abs: 2.9 10*3/uL (ref 0.7–4.0)
MONO ABS: 0.9 10*3/uL (ref 0.1–1.0)
MONOS PCT: 6 %
Neutro Abs: 12.1 10*3/uL — ABNORMAL HIGH (ref 1.7–7.7)
Neutrophils Relative %: 76 %

## 2015-09-18 LAB — CBC
HCT: 45.7 % (ref 39.0–52.0)
Hemoglobin: 15.6 g/dL (ref 13.0–17.0)
MCH: 32.4 pg (ref 26.0–34.0)
MCHC: 34.1 g/dL (ref 30.0–36.0)
MCV: 94.8 fL (ref 78.0–100.0)
PLATELETS: 288 10*3/uL (ref 150–400)
RBC: 4.82 MIL/uL (ref 4.22–5.81)
RDW: 13.1 % (ref 11.5–15.5)
WBC: 16 10*3/uL — ABNORMAL HIGH (ref 4.0–10.5)

## 2015-09-18 LAB — RAPID URINE DRUG SCREEN, HOSP PERFORMED
AMPHETAMINES: NOT DETECTED
BENZODIAZEPINES: NOT DETECTED
Barbiturates: POSITIVE — AB
Cocaine: NOT DETECTED
OPIATES: NOT DETECTED
Tetrahydrocannabinol: NOT DETECTED

## 2015-09-18 LAB — COMPREHENSIVE METABOLIC PANEL
ALT: 21 U/L (ref 17–63)
ANION GAP: 12 (ref 5–15)
AST: 23 U/L (ref 15–41)
Albumin: 4.6 g/dL (ref 3.5–5.0)
Alkaline Phosphatase: 97 U/L (ref 38–126)
BUN: 25 mg/dL — ABNORMAL HIGH (ref 6–20)
CALCIUM: 9.8 mg/dL (ref 8.9–10.3)
CHLORIDE: 103 mmol/L (ref 101–111)
CO2: 22 mmol/L (ref 22–32)
Creatinine, Ser: 1.66 mg/dL — ABNORMAL HIGH (ref 0.61–1.24)
GFR, EST AFRICAN AMERICAN: 53 mL/min — AB (ref >60)
GFR, EST NON AFRICAN AMERICAN: 46 mL/min — AB (ref >60)
Glucose, Bld: 91 mg/dL (ref 65–99)
Potassium: 3.9 mmol/L (ref 3.5–5.1)
SODIUM: 137 mmol/L (ref 135–145)
Total Bilirubin: 0.6 mg/dL (ref 0.3–1.2)
Total Protein: 7.9 g/dL (ref 6.5–8.1)

## 2015-09-18 LAB — APTT: aPTT: 36 seconds (ref 24–37)

## 2015-09-18 LAB — ETHANOL

## 2015-09-18 LAB — I-STAT TROPONIN, ED: TROPONIN I, POC: 0 ng/mL (ref 0.00–0.08)

## 2015-09-18 LAB — PROTIME-INR
INR: 1.31 (ref 0.00–1.49)
PROTHROMBIN TIME: 16.4 s — AB (ref 11.6–15.2)

## 2015-09-18 MED ORDER — OXYCODONE-ACETAMINOPHEN 10-325 MG PO TABS: 1.0000 | ORAL_TABLET | ORAL | Status: DC | PRN

## 2015-09-18 MED ORDER — VENLAFAXINE HCL ER 75 MG PO CP24
150.0000 mg | ORAL_CAPSULE | ORAL | Status: AC
  Administered 2015-09-18: 150 mg via ORAL
  Filled 2015-09-18: qty 2

## 2015-09-18 MED ORDER — SIMVASTATIN 20 MG PO TABS
20.0000 mg | ORAL_TABLET | Freq: Every day | ORAL | Status: DC
  Filled 2015-09-18: qty 1

## 2015-09-18 MED ORDER — ALBUTEROL SULFATE HFA 108 (90 BASE) MCG/ACT IN AERS: 2.0000 | INHALATION_SPRAY | RESPIRATORY_TRACT | Status: DC | PRN

## 2015-09-18 MED ORDER — ACETAMINOPHEN 325 MG PO TABS
650.0000 mg | ORAL_TABLET | Freq: Four times a day (QID) | ORAL | Status: DC | PRN
Start: 2015-09-18 — End: 2015-09-21

## 2015-09-18 MED ORDER — TIOTROPIUM BROMIDE MONOHYDRATE 18 MCG IN CAPS
18.0000 ug | ORAL_CAPSULE | Freq: Every day | RESPIRATORY_TRACT | Status: DC
  Administered 2015-09-19 – 2015-09-21 (×3): 18 ug via RESPIRATORY_TRACT
  Filled 2015-09-18: qty 5

## 2015-09-18 MED ORDER — OXYCODONE HCL 5 MG PO TABS
5.0000 mg | ORAL_TABLET | ORAL | Status: DC | PRN
  Administered 2015-09-18 – 2015-09-21 (×12): 5 mg via ORAL
  Filled 2015-09-18 (×12): qty 1

## 2015-09-18 MED ORDER — ALBUTEROL SULFATE (2.5 MG/3ML) 0.083% IN NEBU: 2.5000 mg | INHALATION_SOLUTION | RESPIRATORY_TRACT | Status: DC | PRN

## 2015-09-18 MED ORDER — OXYCODONE-ACETAMINOPHEN 5-325 MG PO TABS
1.0000 | ORAL_TABLET | ORAL | Status: DC | PRN
  Administered 2015-09-18 – 2015-09-21 (×11): 1 via ORAL
  Filled 2015-09-18 (×12): qty 1

## 2015-09-18 MED ORDER — SENNOSIDES-DOCUSATE SODIUM 8.6-50 MG PO TABS
1.0000 | ORAL_TABLET | Freq: Every evening | ORAL | Status: DC | PRN
  Administered 2015-09-19: 1 via ORAL
  Filled 2015-09-18 (×2): qty 1

## 2015-09-18 MED ORDER — STROKE: EARLY STAGES OF RECOVERY BOOK: Freq: Once | Status: DC

## 2015-09-18 MED ORDER — NICOTINE 21 MG/24HR TD PT24
21.0000 mg | MEDICATED_PATCH | Freq: Every day | TRANSDERMAL | Status: DC
  Administered 2015-09-19 – 2015-09-21 (×3): 21 mg via TRANSDERMAL
  Filled 2015-09-18 (×4): qty 1

## 2015-09-18 MED ORDER — VENLAFAXINE HCL ER 75 MG PO CP24
150.0000 mg | ORAL_CAPSULE | Freq: Every day | ORAL | Status: DC
  Administered 2015-09-19 – 2015-09-20 (×2): 150 mg via ORAL
  Filled 2015-09-18 (×2): qty 2

## 2015-09-18 MED ORDER — LORAZEPAM 2 MG/ML IJ SOLN
INTRAMUSCULAR | Status: AC
  Filled 2015-09-18: qty 1

## 2015-09-18 MED ORDER — LORAZEPAM 2 MG/ML IJ SOLN
1.0000 mg | Freq: Once | INTRAMUSCULAR | Status: AC
  Administered 2015-09-18: 1 mg via INTRAVENOUS

## 2015-09-18 MED ORDER — PREGABALIN 75 MG PO CAPS
150.0000 mg | ORAL_CAPSULE | Freq: Two times a day (BID) | ORAL | Status: DC
  Administered 2015-09-18 – 2015-09-21 (×6): 150 mg via ORAL
  Filled 2015-09-18 (×6): qty 2

## 2015-09-18 MED ORDER — TRAZODONE HCL 100 MG PO TABS
100.0000 mg | ORAL_TABLET | Freq: Every day | ORAL | Status: DC
  Administered 2015-09-18 – 2015-09-20 (×3): 100 mg via ORAL
  Filled 2015-09-18 (×3): qty 1

## 2015-09-18 MED ORDER — RIVAROXABAN 20 MG PO TABS
20.0000 mg | ORAL_TABLET | Freq: Every day | ORAL | Status: DC
  Administered 2015-09-19 – 2015-09-21 (×3): 20 mg via ORAL
  Filled 2015-09-18 (×4): qty 1
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
## 2015-09-18 NOTE — Evaluation (Signed)
Speech Language Pathology Evaluation Patient Details Name: [name] [name] MRN: [id number] DOB: 12-28-1960 Today's Date: 09/18/2015 Time: [id number] SLP Time Calculation (min) (ACUTE ONLY): 47 min  Problem List:  Patient Active Problem List   Diagnosis Date Noted  . CVA (cerebral infarction) 09/18/2015  . Stroke ([place]) 09/18/2015  . Right knee pain 07/21/2015  . Wound of skin 06/03/2015  . Chronic pain syndrome 05/28/2014  . Anxiety state 05/28/2014  . 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 Depression 03/24/2014  . 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 Low back pain 02/16/2014  . Left foot drop 02/14/2014  . HTN (hypertension) 11/15/2011  . Hyperlipidemia 11/15/2011  . COPD (chronic obstructive pulmonary disease) ([place]) 11/15/2011  . Tobacco abuse 11/15/2011   Past Medical History:  Past Medical History  Diagnosis Date  . Constipation due to pain medication   . COPD (chronic obstructive pulmonary disease) ([place])   . Status post spinal surgery   . Hypertension   . Anxiety   . PE (pulmonary embolism)     DVT left leg  . Tobacco abuse   . 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 Migraine    Past Surgical History:  Past Surgical History  Procedure Laterality Date  . Spinal fixation surgery      cervical   HPI:  55 yo male with PMH of COPD, PE, HTN, who presents after sudden fall followed by right-sided weakness/numbness and expressive aphasia. CT head shows acute left parietal infarct.    Assessment / Plan / Recommendation Clinical Impression  Pt has a nonfluent, expressive aphasia with relatively intact comprehension. He has frequent word-finding errors and intermittent phonemic paraphasias at the conversational level. He has good awareness attempts to use circumlocution to facilitate communication. SLP also provided phonemic cues, which were effective at increasing word retrieval. Pt will need 24/7 and f/u SLP to maximize communication.     SLP Assessment  Patient needs continued Speech Lanaguage Pathology Services    Follow Up Recommendations  Inpatient Rehab    Frequency and Duration min 2x/week  2 weeks      SLP Evaluation Prior Functioning  Cognitive/Linguistic Baseline: Within functional limits  Lives With: Significant other Available Help at Discharge: Family;Friend(s);Available 24 hours/[place] (fiance and friend) Vocation: On disability   Cognition  Overall Cognitive Status: Within Functional Limits for tasks assessed Orientation Level: Oriented X4    Comprehension  Auditory Comprehension Overall Auditory Comprehension: Appears within functional limits for tasks assessed Visual Recognition/Discrimination Discrimination: Within Function Limits    Expression Expression Primary Mode of Expression: Verbal Verbal Expression Overall Verbal Expression: Impaired Initiation: No impairment Level of Generative/Spontaneous Verbalization: Conversation Naming: Impairment Other Naming Comments: anomia at the conversational level, occasional phonemic paraphasias Verbal Errors: Phonemic paraphasias;Aware of errors Pragmatics: No impairment   Oral / Motor Oral Motor/Sensory Function Overall Oral Motor/Sensory Function: Within functional limits Motor Speech Overall Motor Speech: Appears within functional limits for tasks assessed    Germain Osgood, M.A. CCC-SLP [phone]  Germain Osgood 09/18/2015, 4:49 PM

## 2015-09-18 NOTE — ED Provider Notes (Signed)
CSN: [id number]     Arrival date & time 09/18/15  [id number] History   First MD Initiated Contact with Patient 09/18/15 1026     Chief Complaint  Patient presents with  . Fall     (Consider location/radiation/quality/duration/timing/severity/associated sxs/prior Treatment) HPI   [name] [name] is a 54 y.o. male here for evaluation of difficulty walking since a fall last night. He went to see his doctor this morning who sent him here for evaluation of possible concussion. Last night he was walking, in a cluttered room and fell. This morning he continued to have trouble walking, so went to see his doctor by private vehicle. He was transferred here, in a wheelchair for evaluation. He complains of pain in injuries to right forehead, right upper arm and difficulty moving his left leg. His left leg problem is related to his chronic sciatica, by his report. He denies headache, new neck or back pain, chest pain, nausea or vomiting. He is uncomfortable since arriving here because his mother died in the hospital. He would like to leave, before treatment. There are no other known modifying factors.    Past Medical History  Diagnosis Date  . Constipation due to pain medication   . COPD (chronic obstructive pulmonary disease) ([place])   . Status post spinal surgery   . Hypertension   . Anxiety   . PE (pulmonary embolism)     DVT left leg  . Tobacco abuse   . 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 Migraine    Past Surgical History  Procedure Laterality Date  . Spinal fixation surgery      cervical   Family History  Problem Relation Age of Onset  . Cancer Mother   . Depression Sister    Social History  Substance Use Topics  . Smoking status: Current Every [place] Smoker -- 1.00 packs/[place]    Types: Cigarettes  . Smokeless tobacco: Never Used     Comment: increased to 2ppd  . Alcohol Use: No     Comment: former    Review of Systems  All other systems reviewed and are  negative.     Allergies  Fentanyl and Methadone  Home Medications   Prior to Admission medications   Medication Sig Start Date End Date Taking? Authorizing Provider  finasteride (PROSCAR) 5 MG tablet Take 1 tablet (5 mg total) by mouth daily. 04/08/15  Yes Leone Brand, MD  hydrochlorothiazide (HYDRODIURIL) 25 MG tablet TAKE 1 TABLET BY MOUTH EVERY [place] 09/04/15  Yes Leone Brand, MD  lisinopril (PRINIVIL,ZESTRIL) 40 MG tablet Take 1 tablet (40 mg total) by mouth daily. 07/21/15  Yes Leone Brand, MD  loratadine (CLARITIN) 10 MG tablet Take 1 tablet (10 mg total) by mouth daily. 07/15/15  Yes Leone Brand, MD  LORazepam (ATIVAN) 2 MG tablet TAKE 1 TABLET BY MOUTH TWICE DAILY AS NEEDED FOR ANXIETY 06/23/15  Yes Leone Brand, MD  oxyCODONE-acetaminophen (PERCOCET) 10-325 MG tablet Take 1 tablet by mouth every 4 (four) hours as needed for pain. Refill after 09/28/2015 09/18/15  Yes Leone Brand, MD  pregabalin (LYRICA) 150 MG capsule Take 1 capsule (150 mg total) by mouth 2 (two) times daily. 08/14/15  Yes Leone Brand, MD  rivaroxaban (XARELTO) 20 MG TABS tablet TAKE 1 TABLET BY MOUTH EVERY [place] WITH SUPPER 04/08/15  Yes Leone Brand, MD  simvastatin (ZOCOR) 20 MG tablet Take 1 tablet (20 mg total) by mouth  daily at 6 PM. 09/09/14  Yes Leone Brand, MD  tiotropium [facility]) 18 MCG inhalation capsule Place 18 mcg into inhaler and inhale daily.   Yes Historical Provider, MD  traZODone (DESYREL) 100 MG tablet Take 1 tablet (100 mg total) by mouth at bedtime. 08/21/15  Yes Leone Brand, MD  venlafaxine XR (EFFEXOR-XR) 150 MG 24 hr capsule TAKE 1 CAPSULE BY MOUTH DAILY 07/30/15  Yes Olam Idler, MD  albuterol (PROVENTIL HFA;VENTOLIN HFA) 108 (90 BASE) MCG/ACT inhaler Inhale 2 puffs into the lungs every 4 (four) hours as needed for wheezing.    Historical Provider, MD  butalbital-acetaminophen-caffeine (FIORICET, ESGIC) [id number] MG per tablet Take 1 tablet by mouth 2 (two) times daily as  needed for headache. 06/23/15   Leone Brand, MD  cyclobenzaprine (FLEXERIL) 10 MG tablet TAKE ONE TABLET BY MOUTH THREE TIMES DAILY AS NEEDED FOR MUSCLE SPASM 07/21/14   Leone Brand, MD  hydrocortisone 2.5 % ointment Apply topically 2 (two) times daily. Patient not taking: Reported on 07/21/2015 05/06/15   Leone Brand, MD   BP 154/98 mmHg  Pulse 75  Temp(Src) 98.1 F (36.7 C)  Resp 18  SpO2 98% Physical Exam  Constitutional: He is oriented to person, place, and time. He appears well-developed. He appears distressed (aanxious, and tearful).  HENT:  Head: Normocephalic and atraumatic.  Right Ear: External ear normal.  Left Ear: External ear normal.  Small contusion right lateral eyebrow with nonbleeding abrasion.  Eyes: Conjunctivae and EOM are normal. Pupils are equal, round, and reactive to light.  Neck: Normal range of motion and phonation normal. Neck supple.  Cardiovascular: Normal rate, regular rhythm and normal heart sounds.   Pulmonary/Chest: Effort normal and breath sounds normal. He exhibits no bony tenderness.  Abdominal: Soft. There is no tenderness.  Musculoskeletal: Normal range of motion.  Bruising right upper arm. No deformity of large joints.  Neurological: He is alert and oriented to person, place, and time. No cranial nerve deficit or sensory deficit. He exhibits normal muscle tone. Coordination normal.  No dysarthria and aphasia or nystagmus. Mild clumsiness, right arm, with movement. No focal weakness of arms or legs.  Skin: Skin is warm, dry and intact.  Psychiatric: He has a normal mood and affect. His behavior is normal. Judgment and thought content normal.  Nursing note and vitals reviewed.   ED Course  Procedures (including critical care time)\  11:30- consideration for thrombolysis, not given because patient out of window for treatment.  Medications - No data to display  Patient Vitals for the past 24 hrs:  BP Temp Pulse Resp SpO2  09/18/15 1120  - 98.1 F (36.7 C) - - -  09/18/15 1115 (!) 152/101 mmHg - 79 18 100 %  09/18/15 1057 154/98 mmHg - - - -  09/18/15 0936 141/80 mmHg 98.1 F (36.7 C) 75 18 98 %   11:31- consultation. Neuro hospitalist: Will see as consultant  11:38 AM-Consult complete with on-call resident. Patient case explained and discussed. He agrees to admit patient for further evaluation and treatment. Call ended at 1145  13:50 Reevaluation with update and discussion. After initial assessment and treatment, an updated evaluation reveals no change in status. Discussed with patient's partner.. [place] Review Labs Reviewed - No data to display  Imaging Review Dg Forearm Right  09/18/2015  CLINICAL DATA:  Pain following fall EXAM: RIGHT FOREARM - 2 VIEW COMPARISON:  None. FINDINGS: Frontal and lateral views  were obtained. No acute fracture or dislocation. No elbow joint effusion. There is a spur arising from the olecranon process of the proximal ulna. Joint spaces appear intact. Incidental note is made of a minus ulnar variant. IMPRESSION: No fracture or dislocation. No appreciable joint space narrowing. Spur arising from the olecranon process of the proximal ulna. Electronically Signed   By: Lowella Grip III M.D.   On: 09/18/2015 10:27   Ct Head Wo Contrast  09/18/2015  CLINICAL DATA:  Fall last night. Right-sided weakness. On blood thinners. Initial encounter. EXAM: CT HEAD WITHOUT CONTRAST CT CERVICAL SPINE WITHOUT CONTRAST TECHNIQUE: Multidetector CT imaging of the head and cervical spine was performed following the standard protocol without intravenous contrast. Multiplanar CT image reconstructions of the cervical spine were also generated. COMPARISON:  Head and cervical spine CT 02/14/2014 FINDINGS: CT HEAD FINDINGS There is cortical and subcortical hypoattenuation consistent with cytotoxic edema in the left MCA territory involving the posterior left insula, superior temporal lobe, and parietal  lobe, consistent with acute to subacute infarct. No acute intracranial hemorrhage, mass, midline shift, or extra-axial fluid collection is identified. Ventricles are normal. Orbits are unremarkable. Mild right frontal sinus and bilateral ethmoid air cell mucosal thickening is noted. Mastoid air cells are clear. No skull fracture is identified. There is mild atherosclerotic calcification at the skullbase. CT CERVICAL SPINE FINDINGS Vertebral alignment is normal. Solid C5-C7 ACDF is again seen. Left-sided facet arthrosis is again seen at C2-3 worse than C4-5. No cervical spine fracture is identified. Minimal scarring is noted in the lung apices. Mild right and mild-to-moderate left carotid bulb atherosclerosis is noted. IMPRESSION: 1. Small to moderate-sized, acute to subacute left MCA infarct. No hemorrhage. 2. No acute abnormality identified in the cervical spine. Solid C5-C7 ACDF. These results were called by telephone at the time of interpretation on 09/18/2015 at 11:03 am to [place], who verbally acknowledged these results. Electronically Signed   By: Logan Bores M.D.   On: 09/18/2015 11:09   Ct Cervical Spine Wo Contrast  09/18/2015  CLINICAL DATA:  Fall last night. Right-sided weakness. On blood thinners. Initial encounter. EXAM: CT HEAD WITHOUT CONTRAST CT CERVICAL SPINE WITHOUT CONTRAST TECHNIQUE: Multidetector CT imaging of the head and cervical spine was performed following the standard protocol without intravenous contrast. Multiplanar CT image reconstructions of the cervical spine were also generated. COMPARISON:  Head and cervical spine CT 02/14/2014 FINDINGS: CT HEAD FINDINGS There is cortical and subcortical hypoattenuation consistent with cytotoxic edema in the left MCA territory involving the posterior left insula, superior temporal lobe, and parietal lobe, consistent with acute to subacute infarct. No acute intracranial hemorrhage, mass, midline shift, or extra-axial fluid collection is  identified. Ventricles are normal. Orbits are unremarkable. Mild right frontal sinus and bilateral ethmoid air cell mucosal thickening is noted. Mastoid air cells are clear. No skull fracture is identified. There is mild atherosclerotic calcification at the skullbase. CT CERVICAL SPINE FINDINGS Vertebral alignment is normal. Solid C5-C7 ACDF is again seen. Left-sided facet arthrosis is again seen at C2-3 worse than C4-5. No cervical spine fracture is identified. Minimal scarring is noted in the lung apices. Mild right and mild-to-moderate left carotid bulb atherosclerosis is noted. IMPRESSION: 1. Small to moderate-sized, acute to subacute left MCA infarct. No hemorrhage. 2. No acute abnormality identified in the cervical spine. Solid C5-C7 ACDF. These results were called by telephone at the time of interpretation on 09/18/2015 at 11:03 am to [place], who verbally acknowledged these results. Electronically  Signed   By: Logan Bores M.D.   On: 09/18/2015 11:09   Dg Humerus Right  09/18/2015  CLINICAL DATA:  Pain following fall EXAM: RIGHT HUMERUS - 2+ VIEW COMPARISON:  None. FINDINGS: Frontal and lateral views obtained. No fracture or dislocation. Joint spaces appear intact. No abnormal periosteal reaction. IMPRESSION: No fracture or dislocation.  No appreciable arthropathic change. Electronically Signed   By: Lowella Grip III M.D.   On: 09/18/2015 10:28   I have personally reviewed and evaluated these images and lab results as part of my medical decision-making.   EKG Interpretation None      MDM   Final diagnoses:  Fall  Stroke [facility])  Stroke [facility])  Stroke [facility])    CVA, subacute, with right sided sx.  No indication for TPA.   Nursing Notes Reviewed/ Care Coordinated Applicable Imaging Reviewed Interpretation of Laboratory Data incorporated into ED treatment   Plan: Admit    Daleen Bo, MD 09/18/15 1921

## 2015-09-18 NOTE — Consult Note (Signed)
Referring Physician: Gwendlyn Deutscher    Chief Complaint: Stroke  HPI:                                                                                                                                         [name] [name] is an 54 y.o. male on [place] for DVT and PE.  Patient uses a cane due to chronic left sciatica issues.  Last night he had a sudden fall followed by weakness of right leg, expressive aphasia and decreased sensation on right arm and leg. He did not come to hospital due to fear of hospitals (his mother dies while in [facility] hospital). 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   Date last known well: Date: 09/18/2015 Time last known well: Unable to determine tPA Given: No: on xeralto, unknown last seen well.     Past Medical History  Diagnosis Date  . Constipation due to pain medication   . COPD (chronic obstructive pulmonary disease) ([place])   . Status post spinal surgery   . Hypertension   . Anxiety   . PE (pulmonary embolism)     DVT left leg  . Tobacco abuse   . 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 Migraine     Past Surgical History  Procedure Laterality Date  . Spinal fixation surgery      cervical    Family History  Problem Relation Age of Onset  . Cancer Mother   . Depression Sister    Social History:  reports that he has been smoking Cigarettes.  He has been smoking about 1.00 pack per [place]. He has never used smokeless tobacco. He reports that he does not drink alcohol or use illicit drugs.  Allergies:  Allergies  Allergen Reactions  . Fentanyl     Per pt he had a near overdose on these due to "regulating body temp"  . Methadone Itching    Medications:                                                                                                                          No current facility-administered medications for this encounter.   Current Outpatient Prescriptions  Medication Sig Dispense Refill   . finasteride (PROSCAR) 5 MG tablet Take 1 tablet (5 mg total) by mouth daily. [place]  tablet 11  . hydrochlorothiazide (HYDRODIURIL) 25 MG tablet TAKE 1 TABLET BY MOUTH EVERY [place] 30 tablet 0  . lisinopril (PRINIVIL,ZESTRIL) 40 MG tablet Take 1 tablet (40 mg total) by mouth daily. 90 tablet 2  . loratadine (CLARITIN) 10 MG tablet Take 1 tablet (10 mg total) by mouth daily. 90 tablet 3  . LORazepam (ATIVAN) 2 MG tablet TAKE 1 TABLET BY MOUTH TWICE DAILY AS NEEDED FOR ANXIETY 60 tablet 3  . oxyCODONE-acetaminophen (PERCOCET) 10-325 MG tablet Take 1 tablet by mouth every 4 (four) hours as needed for pain. Refill after 09/28/2015 180 tablet 0  . pregabalin (LYRICA) 150 MG capsule Take 1 capsule (150 mg total) by mouth 2 (two) times daily. 60 capsule 3  . rivaroxaban (XARELTO) 20 MG TABS tablet TAKE 1 TABLET BY MOUTH EVERY [place] WITH SUPPER 30 tablet 11  . simvastatin (ZOCOR) 20 MG tablet Take 1 tablet (20 mg total) by mouth daily at 6 PM. 90 tablet 11  . tiotropium (SPIRIVA) 18 MCG inhalation capsule Place 18 mcg into inhaler and inhale daily.    . traZODone (DESYREL) 100 MG tablet Take 1 tablet (100 mg total) by mouth at bedtime. 90 tablet 2  . venlafaxine XR (EFFEXOR-XR) 150 MG 24 hr capsule TAKE 1 CAPSULE BY MOUTH DAILY 30 capsule 5  . albuterol (PROVENTIL HFA;VENTOLIN HFA) 108 (90 BASE) MCG/ACT inhaler Inhale 2 puffs into the lungs every 4 (four) hours as needed for wheezing.    . butalbital-acetaminophen-caffeine (FIORICET, ESGIC) 50-325-40 MG per tablet Take 1 tablet by mouth 2 (two) times daily as needed for headache. 60 tablet 3  . cyclobenzaprine (FLEXERIL) 10 MG tablet TAKE ONE TABLET BY MOUTH THREE TIMES DAILY AS NEEDED FOR MUSCLE SPASM 90 tablet 3  . hydrocortisone 2.5 % ointment Apply topically 2 (two) times daily. (Patient not taking: Reported on 07/21/2015) 30 g 1     ROS:                                                                                                                                        History obtained from the patient  General ROS: negative for - chills, fatigue, fever, night sweats, weight gain or weight loss Psychological ROS: negative for - behavioral disorder, hallucinations, memory difficulties, mood swings or suicidal ideation Ophthalmic ROS: negative for - blurry vision, double vision, eye pain or loss of vision ENT ROS: negative for - epistaxis, nasal discharge, oral lesions, sore throat, tinnitus or vertigo Allergy and Immunology ROS: negative for - hives or itchy/watery eyes Hematological and Lymphatic ROS: negative for - bleeding problems, bruising or swollen lymph nodes Endocrine ROS: negative for - galactorrhea, hair pattern changes, polydipsia/polyuria or temperature intolerance Respiratory ROS: negative for - cough, hemoptysis, shortness of breath or wheezing Cardiovascular ROS: negative for - chest pain, dyspnea on exertion, edema or irregular heartbeat Gastrointestinal ROS: negative for - abdominal pain, diarrhea, hematemesis,  nausea/vomiting or stool incontinence Genito-Urinary ROS: negative for - dysuria, hematuria, incontinence or urinary frequency/urgency Musculoskeletal ROS: negative for - joint swelling or muscular weakness Neurological ROS: as noted in HPI Dermatological ROS: negative for rash and skin lesion changes  Physical Examination:                                                                                                      Blood pressure 138/95, pulse 88, temperature 98.1 F (36.7 C), resp. rate 21, SpO2 97 %.  HEENT-  Normocephalic, no lesions, without obvious abnormality.  Normal external eye and conjunctiva.  Normal TM's bilaterally.  Normal auditory canals and external ears. Normal external nose, mucus membranes and septum.  Normal pharynx. Cardiovascular- S1, S2 normal, pulses palpable throughout   Lungs- chest clear, no wheezing, rales, normal symmetric air entry Abdomen- normal findings: bowel sounds  normal Extremities- no edema Lymph-no adenopathy palpable Musculoskeletal-no joint tenderness, deformity or swelling Skin-warm and dry, no hyperpigmentation, vitiligo, or suspicious lesions  Neurological Examination Mental Status: Alert, oriented, thought content appropriate.  Speech not fluent with expressive evidence of aphasia.  Able to follow 3 step commands without difficulty. Cranial Nerves: II: Discs flat bilaterally; Visual fields grossly normal, pupils equal, round, reactive to light and accommodation III,IV, VI: ptosis not present, extra-ocular motions intact bilaterally V,VII: smile symmetric, facial light touch sensation normal bilaterally VIII: hearing normal bilaterally IX,X: uvula rises symmetrically XI: bilateral shoulder shrug XII: midline tongue extension Motor: Right : Upper extremity   5/5    Left:     Upper extremity   5/5  Lower extremity   4/5     Lower extremity   5/5 Tone and bulk:normal tone throughout; no atrophy noted Sensory: Pinprick and light touch decreased on the right arm and leg Deep Tendon Reflexes: 2+ and symmetric throughout Plantars: Right: downgoing   Left: downgoing Cerebellar: normal finger-to-nose,  and normal heel-to-shin test Gait: not tested       Lab Results: Basic Metabolic Panel:  Recent Labs Lab 09/18/15 1144  NA 139  K 3.9  CL 105  GLUCOSE 92  BUN 28*  CREATININE 1.60*    Liver Function Tests: No results for input(s): AST, ALT, ALKPHOS, BILITOT, PROT, ALBUMIN in the last 168 hours. No results for input(s): LIPASE, AMYLASE in the last 168 hours. No results for input(s): AMMONIA in the last 168 hours.  CBC:  Recent Labs Lab 09/18/15 1058 09/18/15 1144  WBC 16.0*  --   NEUTROABS 12.1*  --   HGB 15.6 17.7*  HCT 45.7 52.0  MCV 94.8  --   PLT 288  --     Cardiac Enzymes: No results for input(s): CKTOTAL, CKMB, CKMBINDEX, TROPONINI in the last 168 hours.  Lipid Panel: No results for input(s): CHOL, TRIG,  HDL, CHOLHDL, VLDL, LDLCALC in the last 168 hours.  CBG: No results for input(s): GLUCAP in the last 168 hours.  Microbiology: Results for orders placed or performed during the hospital encounter of 02/19/14  MRSA PCR Screening     Status: None   Collection Time:  02/20/14  2:29 AM  Result Value Ref Range Status   MRSA by PCR NEGATIVE NEGATIVE Final    Comment:        The GeneXpert MRSA Assay (FDA approved for NASAL specimens only), is one component of a comprehensive MRSA colonization surveillance program. It is not intended to diagnose MRSA infection nor to guide or monitor treatment for MRSA infections.    Coagulation Studies: No results for input(s): LABPROT, INR in the last 72 hours.  Imaging: Dg Forearm Right  09/18/2015  CLINICAL DATA:  Pain following fall EXAM: RIGHT FOREARM - 2 VIEW COMPARISON:  None. FINDINGS: Frontal and lateral views were obtained. No acute fracture or dislocation. No elbow joint effusion. There is a spur arising from the olecranon process of the proximal ulna. Joint spaces appear intact. Incidental note is made of a minus ulnar variant. IMPRESSION: No fracture or dislocation. No appreciable joint space narrowing. Spur arising from the olecranon process of the proximal ulna. Electronically Signed   By: Lowella Grip III M.D.   On: 09/18/2015 10:27   Ct Head Wo Contrast  09/18/2015  CLINICAL DATA:  Fall last night. Right-sided weakness. On blood thinners. Initial encounter. EXAM: CT HEAD WITHOUT CONTRAST CT CERVICAL SPINE WITHOUT CONTRAST TECHNIQUE: Multidetector CT imaging of the head and cervical spine was performed following the standard protocol without intravenous contrast. Multiplanar CT image reconstructions of the cervical spine were also generated. COMPARISON:  Head and cervical spine CT 02/14/2014 FINDINGS: CT HEAD FINDINGS There is cortical and subcortical hypoattenuation consistent with cytotoxic edema in the left MCA territory involving the  posterior left insula, superior temporal lobe, and parietal lobe, consistent with acute to subacute infarct. No acute intracranial hemorrhage, mass, midline shift, or extra-axial fluid collection is identified. Ventricles are normal. Orbits are unremarkable. Mild right frontal sinus and bilateral ethmoid air cell mucosal thickening is noted. Mastoid air cells are clear. No skull fracture is identified. There is mild atherosclerotic calcification at the skullbase. CT CERVICAL SPINE FINDINGS Vertebral alignment is normal. Solid C5-C7 ACDF is again seen. Left-sided facet arthrosis is again seen at C2-3 worse than C4-5. No cervical spine fracture is identified. Minimal scarring is noted in the lung apices. Mild right and mild-to-moderate left carotid bulb atherosclerosis is noted. IMPRESSION: 1. Small to moderate-sized, acute to subacute left MCA infarct. No hemorrhage. 2. No acute abnormality identified in the cervical spine. Solid C5-C7 ACDF. These results were called by telephone at the time of interpretation on 09/18/2015 at 11:03 am to [place], who verbally acknowledged these results. Electronically Signed   By: Logan Bores M.D.   On: 09/18/2015 11:09   Ct Cervical Spine Wo Contrast  09/18/2015  CLINICAL DATA:  Fall last night. Right-sided weakness. On blood thinners. Initial encounter. EXAM: CT HEAD WITHOUT CONTRAST CT CERVICAL SPINE WITHOUT CONTRAST TECHNIQUE: Multidetector CT imaging of the head and cervical spine was performed following the standard protocol without intravenous contrast. Multiplanar CT image reconstructions of the cervical spine were also generated. COMPARISON:  Head and cervical spine CT 02/14/2014 FINDINGS: CT HEAD FINDINGS There is cortical and subcortical hypoattenuation consistent with cytotoxic edema in the left MCA territory involving the posterior left insula, superior temporal lobe, and parietal lobe, consistent with acute to subacute infarct. No acute intracranial  hemorrhage, mass, midline shift, or extra-axial fluid collection is identified. Ventricles are normal. Orbits are unremarkable. Mild right frontal sinus and bilateral ethmoid air cell mucosal thickening is noted. Mastoid air cells are clear. No skull fracture  is identified. There is mild atherosclerotic calcification at the skullbase. CT CERVICAL SPINE FINDINGS Vertebral alignment is normal. Solid C5-C7 ACDF is again seen. Left-sided facet arthrosis is again seen at C2-3 worse than C4-5. No cervical spine fracture is identified. Minimal scarring is noted in the lung apices. Mild right and mild-to-moderate left carotid bulb atherosclerosis is noted. IMPRESSION: 1. Small to moderate-sized, acute to subacute left MCA infarct. No hemorrhage. 2. No acute abnormality identified in the cervical spine. Solid C5-C7 ACDF. These results were called by telephone at the time of interpretation on 09/18/2015 at 11:03 am to [place], who verbally acknowledged these results. Electronically Signed   By: Logan Bores M.D.   On: 09/18/2015 11:09   Dg Humerus Right  09/18/2015  CLINICAL DATA:  Pain following fall EXAM: RIGHT HUMERUS - 2+ VIEW COMPARISON:  None. FINDINGS: Frontal and lateral views obtained. No fracture or dislocation. Joint spaces appear intact. No abnormal periosteal reaction. IMPRESSION: No fracture or dislocation.  No appreciable arthropathic change. Electronically Signed   By: Lowella Grip III M.D.   On: 09/18/2015 10:28       Assessment and plan discussed with with attending physician and they are in agreement.    Etta Quill PA-C [facility] [phone]  09/18/2015, 12:09 PM   Assessment: 54 y.o. male with new onset expressive aphasia and decreased right sensation in arm and leg along with weak right leg since last night and CT head showing acute right parietal that infarct that could be embolic. Did not receive iv tpa due to late presentation and being on xarelto. May  need to switch to a different NOAC if cardioembolism in the setting of occult atrial fibrillation. Admit to medicine and complete stroke work up. Stroke team will follow up tomorrow.  Stroke Risk Factors - hypertension, smoking  Recommend: 1. HgbA1c, fasting lipid panel 2. MRI, MRA  of the brain without contrast 3. PT consult, OT consult, Speech consult 4. Echocardiogram 5. Carotid dopplers 6. Prophylactic therapy-continue Xeralto 7. Risk factor modification 8. Telemetry monitoring 9. Frequent neuro checks 10 NPO until passes stroke swallow screen

## 2015-09-18 NOTE — ED Notes (Signed)
Pt requesting to leave ER and refusing admission due to "last time I was admitted my mother died." MD Eulis Foster made aware and is going to speak with patient.

## 2015-09-18 NOTE — Progress Notes (Signed)
   Subjective:    Patient ID: [name] [name], male    DOB: 12/05/1960, 55 y.o.   MRN: [id number]  HPI  CC: patient fall  # Fall:  Patient fell sometime around 9:30pm to 10pm last night. He fell from standing height and hit the right side of his head near his eye on a desk about 3-4 feet high. He lost consciousness, unsure how long he was out as this was unwitnessed. Girlfriend came home around 11:15pm and he was on the floor unable to get up, but was awake at that time. He had difficulty with speech. EMS was called but patient refused to go to the hospital. Overnight he continued to have speech difficulties, unable to walk/bear weight on his right leg, vomited multiple times and has a headache. He does not complain of vision changes or pain with eye movements. He has additionally had a difficult time with breathing.   In the fall he hurt his right knee and inside of his right arm. He cannot put weight on the leg currently. He feels numb on his right hand and has weakness of his right hand ROS: +nausea and vomiting, +headache, +weakness, +slurred speech  # Chronic pain  Patient attempted to bring up this issue since it was what the appointment was originally scheduled for, discussed with him the above issue was more important and would be addressed at a future visit.   Social Hx: current smoker  Review of Systems   See HPI for ROS.   Past medical history, surgical, family, and social history reviewed and updated in the EMR as appropriate. Objective:  BP 153/97 mmHg  Pulse 75  Temp(Src) 98.3 F (36.8 C) (Oral)  Ht 5\' 11"  (1.803 m)  Wt  Vitals and nursing note reviewed  General: moderately distressed, noisy breathing, head leaning over in wheelchair Eyes: pupils are constricted bilaterally/don't react to light. EOMI. There is swelling on the lateral right orbit with ecchymosis ENTM: normal appearing oral cavity CV: RRR, normal heart sounds, no murmurs, rubs or gallop. 2+ radial pulses  bilaterally  Resp: mildly coarse breath sounds bilaterally with few wheezes, increased effort with breathing, rate around 20 Extremities: large ecchymoses medial right forearm/upper arm, able to bend the elbow but painful. There is redness of the anterior aspect of the right knee. Neuro: oriented, slow speech with word finding difficulties, slow to respond. Has numbness of the palmar aspect of his right hand. Grip strength 4/5 on the right (may be decreased from pain), 5/5 on the left.   Assessment & Plan:  Right sided weakness In clinic patient appeared ill with difficulty word finding and some slurring of speech (though not constant), unable to bear weight and had to be brought back in a wheel chair. Concern given his appearance, head struck last night from fall with LOC of unknown duration, persistent nausea/vomiting and weakness/numbness of his right side I instructed him to go to the ED immediately. Patient is on xarelto so concern for intracranial bleed. Note that CT head in ED was positive for a left MCA infarct and he was subsequently admitted.  Chronic pain syndrome Did not discuss due to above emergent issue. I did print off his month supply of oxycodone and gave this to him before leaving clinic.

## 2015-09-18 NOTE — Progress Notes (Signed)
Utilization review completed. Narmeen Kerper, RN, BSN. 

## 2015-09-18 NOTE — ED Notes (Signed)
Pt here for fall last night striking head. Pt having trouble speaking and using right arm. Pt on blood thinners.

## 2015-09-18 NOTE — H&P (Signed)
[facility] Admission History and Physical Service Pager: [phone]  Patient name: [name] [name] Medical record number: [id number] Date of birth: [birth year] Age: 54 y.o. Gender: male  Primary Care Provider: Tawanna Sat, MD Consultants: Neurology Code Status: Full  Chief Complaint: Headache, right sided weakness, and difficulty finding words.  Assessment and Plan: [name] [name] is a 54 y.o. male presenting with headache, right sided weakness, and difficulty finding words. 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 However CT head showing small to moderate-sized, acute to subacute left MCA infarct, no hemorrhage. Neuro saw in ED and was ok continuing Xarelto. Symptoms included Right sided weakness, HA, and expressive aphasia. These had not resolved at the time of admission.  - Telemetry; Admit to [facility], attending Dr. Gwendlyn Deutscher. - Neuro consulted, appreciate recommendations. Recommend continuing Xarelto at this time.  - May need to switch NOAC if this is a cardioemboli due to AFib.  - Holding home HTN meds for permissive HTN after CVA; will monitor. - MRI/MRA without contrast pending - ECHO  - Carotid dopplers - Last hemoglobin A1c (11/2013): 5.3%. Will repeat in the am. - Last direct LDL (01/2013): 75. Will repeat lipid panel in the am. - PT/OT/ST - NPO for now until swallow evaluation can be performed. - q2hr neuro checks - Percocet 5-325mg  q4hrs prn and Roxicodone 5mg  q4hrs prn for pain  2. AKI: Cr elevated to 1.66 on admission. May be secondary to dehydration in the setting of vomiting multiple times overnight. Baseline creatinine 0.7-0.9.  - Will monitor with daily BMETs - Granular casts noted on UA >> will monitor  UOP and Cr in the possible setting of ATN; if UOP diminishes, will obtain FENa (now) and provide Lasix (later if necessary).  3. COPD: On exam, lungs sound clear. - Continue home meds: Albuterol and Spiriva   4. HTN: BPs 138-161/93-99 since admission. - Holding home meds Lisinopril 40mg  qd and HCTZ 25mg  qd for permissive HTN in the setting of acute CVA.  5. HLD: Last direct LDL (01/2013): 75. - Repeat lipid panel in the am. - Continue home med: Zocor 20mg  qd  6. Tobacco abuse: Pt smoking 1-2 packs of cigarettes per [place] since he was a teenager. - Nicotine patch 21mg   7. Hx of Saddle PE: - Continue Xarelto per Neuro recs  8. Depression/anxiety: Appears anxious on exam. -  Continue home meds: Effexor-XR 150mg  qd  9. Sciatica: Pt with left calf atrophy and foot drop for the last 2 years. Uses a walker at baseline. - Continue home med: Lyrica  FEN/GI: NPO until swallow eval by SLP, Senokot for bowel regimen. Prophylaxis: Pt on Xarelto for history of PE.  Disposition: Admit to FPTS for stroke work-up. Discharge recommendations per ST/PT/OT.  History of Present Illness:  [name] [name] is a 54 y.o. male presenting with headache, right sided weakness, and difficulty finding words. Last night around 10pm, he fell and hit the right side of his head on a desk. His girlfriend came home about an hour later and he was awake. She noticed that he couldn't find the right words and he had a facial droop. Per patient, he thinks he lost consciousness, but is not sure for how long. His girlfriend called EMS, but he  refused to come to the ED. Per girlfriend, he was "vomiting all night long". This morning, he could not walk to the car, instead he had to crawl, because he was having new right arm and leg weakness. He was seen by Dr. Lamar Benes in clinic this morning, who sent them to the ED immediately. Right now, Pt is having a sharp headache in the middle of his forehead with associated photophobia. He notes multiple  bruises on his right arm from the fall. He states his right hand is numb. Per girlfriend and best friend, he has been confused, anxious, and irritable today. He endorses blurred vision and shortness of breath. He denies bowel or bladder incontinence, fevers, chest pain, or night sweats. He is on Xarelto for previous PE in 02/2014. He takes this regularly.   Of note, per patient, he has had sciatica on the left side for the last 2 years that has lead to left calf atrophy. He now has limited use of that leg and has left foot drop. He mostly uses a walker at home, but sometimes can just use a cane.  In the ED, i-stat troponin was negative. EKG showed sinus rhythm. CT head was significant for small to moderate-sized, acute to subacute left MCA infarct. No hemorrhage. CT c-spine showed no acute abnormality. X-ray of the right forearm and right humerus showed no fracture or dislocation. Neurology was consulted and recommended HgbA1c, lipid panel, MRI/MRA brain wo contrast, PT/OT/SLP, ECHO, carotid dopplers, continue Xarelto, neuro checks, NPO.   Review Of Systems: Per HPI with the following additions: None Otherwise the remainder of the systems were negative.  Patient Active Problem List   Diagnosis Date Noted  . CVA (cerebral infarction) 09/18/2015  . Stroke ([place]) 09/18/2015  . Right knee pain 07/21/2015  . Wound of skin 06/03/2015  . Chronic pain syndrome 05/28/2014  . Anxiety state 05/28/2014  . 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 Depression 03/24/2014  . 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 Low back pain 02/16/2014  . Left foot drop 02/14/2014  . HTN (hypertension) 11/15/2011  . Hyperlipidemia 11/15/2011  . COPD (chronic obstructive pulmonary disease) ([place]) 11/15/2011  . Tobacco abuse 11/15/2011    Past Medical History: Past Medical History  Diagnosis Date  . Constipation due to pain medication   . COPD (chronic obstructive pulmonary disease) ([place])   . Status post  spinal surgery   . Hypertension   . Anxiety   . PE (pulmonary embolism)     DVT left leg  . Tobacco abuse   . 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 Migraine     Past Surgical History: Past Surgical History  Procedure Laterality Date  . Spinal fixation surgery      cervical    Social History: Social History  Substance Use Topics  . Smoking status: Current Every [place] Smoker -- 1.00 packs/[place]    Types: Cigarettes  . Smokeless tobacco: Never Used     Comment: increased to 2ppd  . Alcohol Use: No     Comment: former   Additional social history: Smokes 1-2 packs/[place] since he was a teenager, no alcohol, no drug use. Please also refer to relevant sections of EMR.  Family History: Family History  Problem Relation Age of Onset  . Cancer Mother   . Depression Sister     Allergies and Medications: Allergies  Allergen Reactions  . Fentanyl     Per pt he had a  near overdose on these due to "regulating body temp"  . Methadone Itching   No current facility-administered medications on file prior to encounter.   Current Outpatient Prescriptions on File Prior to Encounter  Medication Sig Dispense Refill  . finasteride (PROSCAR) 5 MG tablet Take 1 tablet (5 mg total) by mouth daily. 30 tablet 11  . hydrochlorothiazide (HYDRODIURIL) 25 MG tablet TAKE 1 TABLET BY MOUTH EVERY Wendt 30 tablet 0  . lisinopril (PRINIVIL,ZESTRIL) 40 MG tablet Take 1 tablet (40 mg total) by mouth daily. 90 tablet 2  . loratadine (CLARITIN) 10 MG tablet Take 1 tablet (10 mg total) by mouth daily. 90 tablet 3  . LORazepam (ATIVAN) 2 MG tablet TAKE 1 TABLET BY MOUTH TWICE DAILY AS NEEDED FOR ANXIETY 60 tablet 3  . oxyCODONE-acetaminophen (PERCOCET) 10-325 MG tablet Take 1 tablet by mouth every 4 (four) hours as needed for pain. Refill after 09/28/2015 180 tablet 0  . pregabalin (LYRICA) 150 MG capsule Take 1 capsule (150 mg total) by mouth 2 (two) times daily. 60 capsule 3  . rivaroxaban  (XARELTO) 20 MG TABS tablet TAKE 1 TABLET BY MOUTH EVERY [place] WITH SUPPER 30 tablet 11  . simvastatin (ZOCOR) 20 MG tablet Take 1 tablet (20 mg total) by mouth daily at 6 PM. 90 tablet 11  . tiotropium (SPIRIVA) 18 MCG inhalation capsule Place 18 mcg into inhaler and inhale daily.    . traZODone (DESYREL) 100 MG tablet Take 1 tablet (100 mg total) by mouth at bedtime. 90 tablet 2  . venlafaxine XR (EFFEXOR-XR) 150 MG 24 hr capsule TAKE 1 CAPSULE BY MOUTH DAILY 30 capsule 5  . albuterol (PROVENTIL HFA;VENTOLIN HFA) 108 (90 BASE) MCG/ACT inhaler Inhale 2 puffs into the lungs every 4 (four) hours as needed for wheezing.    . butalbital-acetaminophen-caffeine (FIORICET, ESGIC) 50-325-40 MG per tablet Take 1 tablet by mouth 2 (two) times daily as needed for headache. 60 tablet 3  . cyclobenzaprine (FLEXERIL) 10 MG tablet TAKE ONE TABLET BY MOUTH THREE TIMES DAILY AS NEEDED FOR MUSCLE SPASM 90 tablet 3  . hydrocortisone 2.5 % ointment Apply topically 2 (two) times daily. (Patient not taking: Reported on 07/21/2015) 30 g 1    Objective: BP 143/93 mmHg  Pulse 65  Temp(Src) 100.5 F (38.1 C) (Oral)  Resp 20  SpO2 100% Exam: General: Anxious, appears uncomfortable, difficulty finding words. Head: 2cm x 2cm circular abrasion on right side of forehead with some underlying edema. Eyes: Cannot assess pupils, as Pt will not let us shine a light in his eyes; no scleral icterus, EOMI. ENTM: Dry mucous membranes Neck: Supple Cardiovascular: RRR, no murmurs, could not palpate DP pulse on left, 1+ DP on right. Respiratory: CTAB, no wheezes Abdomen: +BS, non-distended MSK: No edema, atrophy of left calf w/ significant pain w/ manual dorsiflexion (patient states this is baseline) Skin: Multiple ecchymoses present on right forearm, dry skin on feet bilaterally Neuro: Awake, alert; expressive aphasia present; able to follow commands; tongue is midline when extended, able to move tongue side to side; facial  sensation to light touch intact bilaterally; 5/5 muscle strength in left upper and lower extremities, except for 0/5 muscle strength with flexion and extension at the left ankle; 4/5 muscle strength in right upper and lower extremities; 3/5 right grip strength, no clonus in the lower extremities; finger-to-nose on right is somewhat uncoordinated; normal finger-to-nose on left. Psych: Appears anxious and irritable  Labs and Imaging: CBC BMET   Recent Labs Lab 09/18/15  1058 09/18/15 1144  WBC 16.0*  --   HGB 15.6 17.7*  HCT 45.7 52.0  PLT 288  --     Recent Labs Lab 09/18/15 1058 09/18/15 1144  NA 137 139  K 3.9 3.9  CL 103 105  CO2 22  --   BUN 25* 28*  CREATININE 1.66* 1.60*  GLUCOSE 91 92  CALCIUM 9.8  --      -i-stat troponin: 0.00 - Ethyl alcohol: <5 - UDS positive for barbiturates  - UA: uric acid crystals, moderate Hgb, 15 ketones, negative leuks, negative nitrites, 0-2 WBC  -EKG: normal sinus rhythm -CT head (11/11): Small to moderate-sized, acute to subacute left MCA infarct. No hemorrhage - CT c-spine (11/11): No acute abnormality - R forearm (11/11): No fracture or dislocation - R humerus (11/11): No fracture or dislocation  Sela Hua, MD 09/18/2015, 3:19 PM PGY-1, [place] Intern pager: [phone], text pages welcome   Upper Level Addendum:  I have seen and evaluated this patient along with Dr. Brett Albino and reviewed the above note, making necessary revisions in red.   Elberta Leatherwood, MD,MS,  PGY2 09/18/2015 6:56 PM

## 2015-09-18 NOTE — Assessment & Plan Note (Signed)
Did not discuss due to above emergent issue. I did print off his month supply of oxycodone and gave this to him before leaving clinic.

## 2015-09-18 NOTE — Progress Notes (Signed)
FPTS Interim Progress Note  Received call from radiology informing me of MRI results. Results listed below: IMPRESSION: Moderately large area of acute infarction affecting distal LEFT MCA territory including posterior frontal and parietal lobes, LEFT insula, and subcortical white matter. Sparing of the basal ganglia and periventricular white matter.  Early hemorrhagic petechial transformation of the acute infarct, in this patient on anticoagulants. This cannot be appreciated on prior CT, and may have developed since then, or be of such in nature as to be inapparent on CT. Close surveillance is warranted to include repeat short-term follow-up with CT scanning to monitor for mass effect or lobar hemorrhage.  Unremarkable MRA intracranial. No features to suggest significant anterior circulation intracranial atherosclerotic change.   I had called the neurologist on duty, C.Nicole Kindred, he returned my page promptly. I informed him of the petechial transformation noted on the MRI and asked if he felt comfortable continuing anticoagulation with Xarelto. Dr. Nicole Kindred stated that he was okay to continue Xarelto.   Elberta Leatherwood, MD 09/18/2015, 8:14 PM PGY-2, [place] Medicine Service pager [phone]

## 2015-09-18 NOTE — ED Notes (Signed)
At current time pt agreeing to be transferred to [facility].

## 2015-09-18 NOTE — Evaluation (Signed)
Clinical/Bedside Swallow Evaluation Patient Details  Name: [name] [name] MRN: [id number] Date of Birth: 1961/03/03  Today's Date: 09/18/2015 Time: SLP Start Time (ACUTE ONLY): 1526 SLP Stop Time (ACUTE ONLY): 1613 SLP Time Calculation (min) (ACUTE ONLY): 47 min  Past Medical History:  Past Medical History  Diagnosis Date  . Constipation due to pain medication   . COPD (chronic obstructive pulmonary disease) ([place])   . Status post spinal surgery   . Hypertension   . Anxiety   . PE (pulmonary embolism)     DVT left leg  . Tobacco abuse   . 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 Migraine    Past Surgical History:  Past Surgical History  Procedure Laterality Date  . Spinal fixation surgery      cervical   HPI:  54 yo male with PMH of COPD, PE, HTN, who presents after sudden fall followed by right-sided weakness/numbness and expressive aphasia. CT head shows acute left parietal infarct.    Assessment / Plan / Recommendation Clinical Impression  Pt's oropharyngeal swallow is WFL, and he has no subejctive complaints of difficulty. Will initiate regular textures and thin liquids - no further swallowing needs identified.    Aspiration Risk  No limitations    Diet Recommendation  Regular textures, thin liquids Cup and straw okay   Medication Administration: Whole meds with liquid    Other  Recommendations Oral Care Recommendations: Oral care BID   Follow up Recommendations   None for swallow - please see speech/language evaluation        Swallow Study   General HPI: 54 yo male with PMH of COPD, PE, HTN, who presents after sudden fall followed by right-sided weakness/numbness and expressive aphasia. CT head shows acute left parietal infarct.  Type of Study: Bedside Swallow Evaluation Previous Swallow Assessment: none in chart Diet Prior to this Study: NPO Temperature Spikes Noted: Yes (100.5) Respiratory Status: Room air History of Recent Intubation:  No Behavior/Cognition: Alert;Cooperative;Pleasant mood Oral Cavity Assessment: Within Functional Limits Oral Care Completed by SLP: No Oral Cavity - Dentition: Adequate natural dentition Vision: Functional for self-feeding Self-Feeding Abilities: Able to feed self Patient Positioning: Upright in bed Baseline Vocal Quality: Normal Volitional Cough: Strong Volitional Swallow: Able to elicit    Oral/Motor/Sensory Function Overall Oral Motor/Sensory Function: Within functional limits   Ice Chips Ice chips: Not tested   Thin Liquid Thin Liquid: Within functional limits Presentation: Cup;Self Fed;Straw    Nectar Thick Nectar Thick Liquid: Not tested   Honey Thick Honey Thick Liquid: Not tested   Puree Puree: Not tested (pt declined)   Solid Solid: Within functional limits Presentation: Self Fed      Germain Osgood, M.A. CCC-SLP [phone]  Germain Osgood 09/18/2015,4:43 PM

## 2015-09-19 ENCOUNTER — Inpatient Hospital Stay (HOSPITAL_COMMUNITY): Payer: Medicaid Other

## 2015-09-19 DIAGNOSIS — J449 Chronic obstructive pulmonary disease, unspecified: Secondary | ICD-10-CM | POA: Insufficient documentation

## 2015-09-19 DIAGNOSIS — I69320 Aphasia following cerebral infarction: Secondary | ICD-10-CM | POA: Insufficient documentation

## 2015-09-19 DIAGNOSIS — IMO0002 Reserved for concepts with insufficient information to code with codable children: Secondary | ICD-10-CM | POA: Insufficient documentation

## 2015-09-19 DIAGNOSIS — W19XXXA Unspecified fall, initial encounter: Secondary | ICD-10-CM | POA: Insufficient documentation

## 2015-09-19 DIAGNOSIS — E785 Hyperlipidemia, unspecified: Secondary | ICD-10-CM

## 2015-09-19 LAB — BASIC METABOLIC PANEL
Anion gap: 11 (ref 5–15)
BUN: 23 mg/dL — ABNORMAL HIGH (ref 6–20)
CALCIUM: 9 mg/dL (ref 8.9–10.3)
CO2: 24 mmol/L (ref 22–32)
CREATININE: 1.23 mg/dL (ref 0.61–1.24)
Chloride: 101 mmol/L (ref 101–111)
GFR calc Af Amer: >60 mL/min (ref >60)
GFR calc non Af Amer: >60 mL/min (ref >60)
Glucose, Bld: 104 mg/dL — ABNORMAL HIGH (ref 65–99)
POTASSIUM: 2.8 mmol/L — AB (ref 3.5–5.1)
Sodium: 136 mmol/L (ref 135–145)

## 2015-09-19 LAB — TSH: TSH: 0.141 u[IU]/mL — ABNORMAL LOW (ref 0.350–4.500)

## 2015-09-19 LAB — LIPID PANEL
CHOL/HDL RATIO: 4.5 ratio
Cholesterol: 185 mg/dL (ref 0–200)
HDL: 41 mg/dL (ref >40)
LDL CALC: 120 mg/dL — AB (ref 0–99)
TRIGLYCERIDES: 121 mg/dL (ref <150)
VLDL: 24 mg/dL (ref 0–40)

## 2015-09-19 MED ORDER — SODIUM CHLORIDE 0.9 % IV BOLUS (SEPSIS)
1000.0000 mL | Freq: Once | INTRAVENOUS | Status: AC
  Administered 2015-09-19: 1000 mL via INTRAVENOUS

## 2015-09-19 MED ORDER — SODIUM CHLORIDE 0.9 % IV BOLUS (SEPSIS): 1000.0000 mL | Freq: Once | INTRAVENOUS | Status: AC

## 2015-09-19 MED ORDER — SIMVASTATIN 40 MG PO TABS
40.0000 mg | ORAL_TABLET | Freq: Every day | ORAL | Status: DC
  Administered 2015-09-19: 40 mg via ORAL
  Filled 2015-09-19: qty 1

## 2015-09-19 MED ORDER — SODIUM CHLORIDE 0.9 % IV SOLN
INTRAVENOUS | Status: DC
  Administered 2015-09-19: 15:00:00 via INTRAVENOUS

## 2015-09-19 MED ORDER — POTASSIUM CHLORIDE CRYS ER 20 MEQ PO TBCR
30.0000 meq | EXTENDED_RELEASE_TABLET | Freq: Three times a day (TID) | ORAL | Status: AC
  Administered 2015-09-19 – 2015-09-20 (×3): 30 meq via ORAL
  Filled 2015-09-19 (×6): qty 1

## 2015-09-19 NOTE — Progress Notes (Signed)
  Echocardiogram 2D Echocardiogram has been performed.  Jennette Dubin 09/19/2015, 12:12 PM

## 2015-09-19 NOTE — Progress Notes (Signed)
Family Medicine Teaching Service Daily Progress Note Intern Pager: [phone]  Patient name: [name] [name] Medical record number: [id number] Date of birth: 1960/12/13 Age: 54 y.o. Gender: male  Primary Care Provider: Tawanna Sat, MD Consultants: Neuro Code Status: Full  Pt Overview and Major Events to Date:  11/11: patient admitted for new Rt weakness and aphasia; found to have CVA  Assessment and Plan: [name] [name] is a 54 y.o. male presenting with headache, right sided weakness, and difficulty finding words. 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 CVA, left MCA: Improving Symptoms: CT head showing small to moderate-sized, acute to subacute left MCA infarct, no hemorrhage >> MRA showed small petechial bleed. Neuro was ok continuing Xarelto. Symptoms included Right sided weakness, HA, and expressive aphasia.  - Neuro consulted, appreciate recommendations. Recommend continuing Xarelto at this time. - May need to switch NOAC if this is a cardioemboli due to AFib.  - Holding home HTN meds for permissive HTN after CVA; will monitor. - MRI/MRA without contrast: early petechial hemorrhage transformation - ECHO and Carotid dopplers >> pending - Last hemoglobin A1c (11/2013): 5.3%. Will repeat in the am >> pending - Last direct LDL (01/2013): 75. Will repeat lipid panel in the am>> LDL 120 - PT/OT/ST - q2hr neuro checks - Percocet 5-325mg  q4hrs prn and Roxicodone 5mg  q4hrs prn for pain  2. Intracranial Petechial Hemorrhage: Noted on MRA 11/11 - Repeat CT 11/12: Stable appearance of left MCA infarct.  3. AKI: Cr elevated to 1.66 on admission. Baseline creatinine 0.7-0.9.  - Today: 1.23 - Will monitor - Granular casts noted on UA >> will monitor UOP and Cr in the possible setting of ATN; if UOP diminishes, will obtain FENa (now) and provide Lasix (later if necessary).  4.  COPD: On exam, lungs sound clear. - Continue home meds: Albuterol and Spiriva   5. HTN:. - Holding home meds Lisinopril 40mg  qd and HCTZ 25mg  qd for permissive HTN in the setting of acute CVA.  6. HLD: Last direct LDL (01/2013): 75. - LDL 120 - Increase Zocor to 40mg  from 20mg  qd  7. Tobacco abuse: Pt smoking 1-2 packs of cigarettes per [place] since he was a teenager. - Nicotine patch 21mg   8. Hx of Saddle PE: - Continue Xarelto per Neuro recs  9. Depression/anxiety: Appears anxious on exam. - Continue home meds: Effexor-XR 150mg  qd  10. Sciatica: Pt with left calf atrophy and foot drop for the last 2 years. Uses a walker at baseline. - Continue home med: Lyrica  FEN/GI: Heart Healthy/ NS @ 31ml/hr Prophylaxis: Pt on Xarelto for history of PE.   Disposition: pending PT eval  Subjective:  Patient feels much better today. Aphasia is improved and he is interested in when he can go home.   Objective: Temp:  [98.2 F (36.8 C)-100.5 F (38.1 C)] 98.4 F (36.9 C) (11/12 0958) Pulse Rate:  [60-81] 81 (11/12 0958) Resp:  [17-20] 18 (11/12 0958) BP: (90-143)/(51-93) 116/54 mmHg (11/12 0958) SpO2:  [95 %-100 %] 96 % (11/12 0958) Physical Exam: General: NAD, much less anxious today. Expressive aphasia no longer present. Cardiovascular: RRR, no murmurs Respiratory: CTAB, no wheezes Neuro: Awake, alert; expressive aphasia absent; able to follow commands; 5/5 muscle strength in left upper and lower extremities, except for 0/5 muscle strength with flexion and extension at the left ankle; sensation diminished on right compared to left UE  Laboratory:  Recent Labs Lab 09/18/15 1058 09/18/15 1144  WBC 16.0*  --   HGB 15.6 17.7*  HCT 45.7 52.0  PLT 288  --     Recent Labs Lab 09/18/15 1058 09/18/15 1144 09/19/15 0250  NA 137 139 136  K 3.9 3.9 2.8*  CL 103 105 101  CO2 22  --  24  BUN 25* 28* 23*  CREATININE 1.66* 1.60* 1.23  CALCIUM 9.8  --  9.0  PROT 7.9  --   --    BILITOT 0.6  --   --   ALKPHOS 97  --   --   ALT 21  --   --   AST 23  --   --   GLUCOSE 91 92 104*    Imaging/Diagnostic Tests: CT Head/C-spine 11/11 IMPRESSION: 1. Small to moderate-sized, acute to subacute left MCA infarct. No hemorrhage. 2. No acute abnormality identified in the cervical spine. Solid C5-C7 ACDF.  MRI/MRA Head 11/11 IMPRESSION: Moderately large area of acute infarction affecting distal LEFT MCA territory including posterior frontal and parietal lobes, LEFT insula, and subcortical white matter. Sparing of the basal ganglia and periventricular white matter.  Early hemorrhagic petechial transformation of the acute infarct, in this patient on anticoagulants. This cannot be appreciated on prior CT, and may have developed since then, or be of such in nature as to be inapparent on CT. Close surveillance is warranted to include repeat short-term follow-up with CT scanning to monitor for mass effect or lobar hemorrhage.  Unremarkable MRA intracranial. No features to suggest significant anterior circulation intracranial atherosclerotic change.  CT Head 11/12 IMPRESSION: Stable appearance of left MCA infarct.   [name] Leatherwood, MD 09/19/2015, 12:27 PM PGY-2, [place] Intern pager: [phone], text pages welcome

## 2015-09-19 NOTE — Progress Notes (Addendum)
STROKE TEAM PROGRESS NOTE   HISTORY [name] [name] is an 54 y.o. 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 Patient uses a cane due to chronic left sciatica issues. The night before admission night he had a sudden fall followed by weakness of right leg, expressive aphasia and decreased sensation on right arm and leg. He did not come to hospital due to fear of hospitals (his mother died while in [facility] hospital). 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   Date last known well: Date: 09/18/2015 Time last known well: Unable to determine tPA Given: No: on xeralto, unknown last seen well.   SUBJECTIVE (INTERVAL HISTORY) His family is not at the bedside.  Overall he feels his condition is gradually improving. There were no event overnight and he had no complaints for full ROS   OBJECTIVE Temp:  [98.2 F (36.8 C)-100.5 F (38.1 C)] 98.4 F (36.9 C) (11/12 0958) Pulse Rate:  [60-88] 81 (11/12 0958) Cardiac Rhythm:  [-] Sinus bradycardia;Bundle branch block (11/12 0700) Resp:  [17-21] 18 (11/12 0958) BP: (90-161)/(51-99) 116/54 mmHg (11/12 0958) SpO2:  [95 %-100 %] 96 % (11/12 0958)  CBC:   Recent Labs Lab 09/18/15 1058 09/18/15 1144  WBC 16.0*  --   NEUTROABS 12.1*  --   HGB 15.6 17.7*  HCT 45.7 52.0  MCV 94.8  --   PLT 288  --     Basic Metabolic Panel:   Recent Labs Lab 09/18/15 1058 09/18/15 1144 09/19/15 0250  NA 137 139 136  K 3.9 3.9 2.8*  CL 103 105 101  CO2 22  --  24  GLUCOSE 91 92 104*  BUN 25* 28* 23*  CREATININE 1.66* 1.60* 1.23  CALCIUM 9.8  --  9.0    Lipid Panel:     Component Value Date/Time   CHOL 185 09/19/2015 0250   TRIG 121 09/19/2015 0250   HDL 41 09/19/2015 0250   CHOLHDL 4.5 09/19/2015 0250   VLDL 24 09/19/2015 0250   LDLCALC 120* 09/19/2015 0250   HgbA1c:  Lab Results  Component Value Date   HGBA1C 5.3 11/20/2013   Urine Drug Screen:     Component Value Date/Time   LABOPIA NONE DETECTED 09/18/2015 1301    COCAINSCRNUR NONE DETECTED 09/18/2015 1301   LABBENZ NONE DETECTED 09/18/2015 1301   AMPHETMU NONE DETECTED 09/18/2015 1301   THCU NONE DETECTED 09/18/2015 1301   LABBARB POSITIVE* 09/18/2015 1301      IMAGING  Dg Forearm Right 09/18/2015   No fracture or dislocation. No appreciable joint space narrowing. Spur arising from the olecranon process of the proximal ulna.    Ct Head Wo Contrast 09/18/2015   1. Small to moderate-sized, acute to subacute left MCA infarct. No hemorrhage.  2. No acute abnormality identified in the cervical spine. Solid C5-C7 ACDF.   Ct Cervical Spine Wo Contrast 09/18/2015   1. Small to moderate-sized, acute to subacute left MCA infarct. No hemorrhage.  2. No acute abnormality identified in the cervical spine. Solid C5-C7 ACDF.     Mr Jodene Nam Head/brain Wo Cm 09/18/2015   Moderately large area of acute infarction affecting distal LEFT MCA territory including posterior frontal and parietal lobes, LEFT insula, and subcortical white matter. Sparing of the basal ganglia and periventricular white matter. Early hemorrhagic petechial transformation of the acute infarct, in this patient on anticoagulants. This cannot be appreciated on prior CT, and may have developed since then, or be of such in nature  as to be inapparent on CT. Close surveillance is warranted to include repeat short-term follow-up with CT scanning to monitor for mass effect or lobar hemorrhage.   Unremarkable MRA intracranial. No features to suggest significant anterior circulation intracranial atherosclerotic change.     Dg Humerus Right 09/18/2015   No fracture or dislocation.  No appreciable arthropathic change.   PHYSICAL EXAM General - Well nourished, well developed, in NAD   HEENT: wnl Cardiovascular - Regular rate and rhythm Pulmonary: CTA Abdomen: NT, ND, normal bowel sounds Extremities: No C/C/E  Neurological Exam Mental Status: Normal Orientation:  Oriented to person, place  and time Speech:  Fluent; no dysarthria Cranial Nerves:  PERRL; EOMI; visual fields full, face grossly symmetric, hearing grossly intact; shrug symmetric and tongue midline Motor Exam:  Tone:  Within normal limits;   Strength: 5/5 throughout;  atrophy of left calf muscle and there is chronic foot drop of left foot  Sensory: Intact to light touch throughout except for portion of right forearm and all of right hand  Coordination:  Intact finger to nose  Gait: Deferred  ASSESSMENT/PLAN [name] [name] is a 54 y.o. male with history of Xarelto therapy for DVT and PE, chronic left sciatica issues, COPD, hypertension, tobacco abuse, and migraine headaches presenting after a sudden fall and subsequent right hemiparesis and expressive aphasia.  He did not receive IV t-PA due to Xarelto therapy.  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  Resultant  Right sided numbness and weakness  MRI - Moderately large area of acute infarction affecting distal LEFT MCA territory.  MRA - unremarkable  Carotid Doppler pending  2D Echo pending - needs bubble study  LDL - 120  HgbA1c pending  Xarelto for VTE prophylaxis Diet Heart Room service appropriate?: Yes; Fluid consistency:: Thin  Xarelto (rivaroxaban) daily prior to admission, now on Xarelto (rivaroxaban) daily  Patient counseled to be compliant with his antithrombotic medications  Ongoing aggressive stroke risk factor management  Therapy recommendations: Pending  Disposition:  Pending  Hypertension  Blood pressure running low. Consider IV hydration.  Currently not on anti hypertensive medsications.  Permissive hypertension (OK if < 220/120) but gradually normalize in 5-7 days  Hyperlipidemia  Home meds:  Zocor 20 mg daily resumed in hospital  LDL 120, goal < 70  Would increase Zocor to 40 mg daily or change to lipitor.  Continue statin at discharge  Other Stroke Risk Factors  Cigarette  smoker, advised to stop smoking.  Migraines  Other Active Problems  Hypokalemia  Mildly elevated BUN  Leukocytosis  [facility]  Lowry Ram [facility] Pager [phone] 09/19/2015, 11:39 AM  NEUROLOGY ATTENDING NOTE Patient was seen and examined by me personally. I reviewed notes, independently viewed imaging studies, participated in medical decision making and plan of care. I have made additions or clarifications directly to the above note.  Documentation accurately reflects findings.  We discussed the fact that he has had DVT and PE in the past.  This certainly raises the question of hypercoagulable states.  Recommend chart review for comprehensive work-up of hypercoagulable states for genetic information.  If known found, would pursue.    The laboratory and radiographic studies were personally reviewed by me.  ROS completed by me personally and pertinent positives fully documented.  Patient was without complaints for full ROS  Assessment and plan completed by me personally and fully documented above.  Condition is improved   I spent 30 minutes  of consultation care time in the care of  this patient.  SIGNED BY: Dr. Elissa Hefty     To contact Stroke Continuity provider, please refer to [email or web address] After hours, contact General Neurology

## 2015-09-19 NOTE — Progress Notes (Signed)
PT Cancellation Note  Patient Details Name: [name] [name] MRN: [id number] DOB: 1961-09-26   Cancelled Treatment:    Reason Eval/Treat Not Completed: Patient at procedure or test/unavailable.  Will check back as time allows.   Ramond Dial 09/19/2015, 2:16 PM   Mee Hives, PT MS Acute Rehab Dept. Number: [facility] [id number] and [place] [phone]

## 2015-09-19 NOTE — Evaluation (Signed)
Clinical/Bedside Swallow Evaluation Patient Details  Name: [name] [name] MRN: [id number] Date of Birth: 25-Nov-1960  Today's Date: 09/19/2015 Time: SLP Start Time (ACUTE ONLY): 1000 SLP Stop Time (ACUTE ONLY): 1013 SLP Time Calculation (min) (ACUTE ONLY): 13 min  Past Medical History:  Past Medical History  Diagnosis Date  . Constipation due to pain medication   . COPD (chronic obstructive pulmonary disease) ([place])   . Status post spinal surgery   . Hypertension   . Anxiety   . PE (pulmonary embolism)     DVT left leg  . Tobacco abuse   . 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 Migraine    Past Surgical History:  Past Surgical History  Procedure Laterality Date  . Spinal fixation surgery      cervical   HPI:  Pt is a 54 yo male with PMH of COPD, PE, HTN, who presents after sudden fall followed by right-sided weakness/numbness and expressive aphasia. CT head shows acute left parietal infarct.  Further acute changes have been identified via MRI of brain including moderately large area of acute infarction affecting distal LEFT MCA territory including posterior frontal and parietal lobes, LEFT insula, and subcortical white matter in addition to early hemorrhagic petechial transformation of the acute infarct.  ST to re evaluate swallow function given further acute neurological changes.     Assessment / Plan / Recommendation Clinical Impression  Swallow function remains intact without overt signs or symptoms of reduced airway protection. Oral motor exam also unremarkable. Pt reports improvements in right upper and lower extremity strength and usage. Pt with significant imrovements in speech and language abilities in the last 12 hours. Pt admits anomic difficulties however is fluent with expressive language at the conversational level.  Continue regular thin liquid diet. Pt very eager for DC. Recommend outpatient SLP follow up for anomic difficulties. No further swallow  intervention indicated.      Aspiration Risk  No limitations    Diet Recommendation     Medication Administration: Whole meds with liquid    Other  Recommendations Oral Care Recommendations: Oral care BID   Follow up Recommendations  Outpatient SLP (for speech and language)    Frequency and Duration            Swallow Study   General Date of Onset: 09/18/15 HPI: Pt is a 54 yo male with PMH of COPD, PE, HTN, who presents after sudden fall followed by right-sided weakness/numbness and expressive aphasia. CT head shows acute left parietal infarct.  Further acute changes have been identified via MRI of brain including moderately large area of acute infarction affecting distal LEFT MCA territory including posterior frontal and parietal lobes, LEFT insula, and subcortical white matter in addition to early hemorrhagic petechial transformation of the acute infarct.  ST to re evaluate swallow function given further acute neurological changes.   Type of Study: Bedside Swallow Evaluation Previous Swallow Assessment: BSE 09-18-15; WFL since then further acute CVA  Diet Prior to this Study: Regular;Thin liquids Temperature Spikes Noted: Yes Respiratory Status: Room air History of Recent Intubation: No Behavior/Cognition: Alert;Cooperative;Pleasant mood Oral Cavity Assessment: Within Functional Limits Oral Care Completed by SLP: No Oral Cavity - Dentition: Adequate natural dentition Vision: Functional for self-feeding Self-Feeding Abilities: Able to feed self Patient Positioning: Upright in bed Baseline Vocal Quality: Normal Volitional Cough: Strong Volitional Swallow: Able to elicit    Oral/Motor/Sensory Function Overall Oral Motor/Sensory Function: Within functional limits   Ice Chips Ice  chips: Within functional limits   Thin Liquid Thin Liquid: Within functional limits Presentation: Cup;Self Fed;Straw    Nectar Thick Nectar Thick Liquid: Not tested   Honey Thick Honey Thick Liquid:  Not tested   Puree Puree: Not tested (pt declined)   Solid Solid: Within functional limits Presentation: [place] MA, [place] Speech Language Pathologist    [name] [name] 09/19/2015,10:18 AM

## 2015-09-19 NOTE — Progress Notes (Signed)
VASCULAR LAB PRELIMINARY  PRELIMINARY  PRELIMINARY  PRELIMINARY  Carotid duplex  completed.    Preliminary report:  Bilateral:  1-39% ICA stenosis.  Vertebral artery flow is antegrade.      Marcayla Budge, RVT 09/19/2015, 2:32 PM

## 2015-09-20 DIAGNOSIS — I63419 Cerebral infarction due to embolism of unspecified middle cerebral artery: Secondary | ICD-10-CM

## 2015-09-20 LAB — BASIC METABOLIC PANEL
Anion gap: 6 (ref 5–15)
BUN: 17 mg/dL (ref 6–20)
CALCIUM: 8.4 mg/dL — AB (ref 8.9–10.3)
CO2: 23 mmol/L (ref 22–32)
CREATININE: 0.96 mg/dL (ref 0.61–1.24)
Chloride: 109 mmol/L (ref 101–111)
GFR calc non Af Amer: >60 mL/min (ref >60)
Glucose, Bld: 100 mg/dL — ABNORMAL HIGH (ref 65–99)
Potassium: 4.4 mmol/L (ref 3.5–5.1)
SODIUM: 138 mmol/L (ref 135–145)

## 2015-09-20 MED ORDER — ATORVASTATIN CALCIUM 80 MG PO TABS
80.0000 mg | ORAL_TABLET | Freq: Every day | ORAL | Status: DC
  Administered 2015-09-20 – 2015-09-21 (×2): 80 mg via ORAL
  Filled 2015-09-20 (×2): qty 1

## 2015-09-20 NOTE — Evaluation (Signed)
Occupational Therapy Evaluation and Discharge Patient Details Name: [name] [name] MRN: [id number] DOB: 1961-06-12 Today's Date: 09/20/2015    History of Present Illness 54 Y/O m with PMX of DVT/PE, HTN,COPD,HLD, Depression who was sent to the hospital by his PCP after presenting with 1 Topete hx of speech difficulty and limb numbness. MRI: Moderately large area of acute infarction affecting distal LEFT MCA territory including posterior frontal and parietal lobes, LEFT insula, and subcortical white matter.   Clinical Impression   This 54 yo male admitted with above presents to acute OT at a Mod I level, no further needs, we will D/C.    Follow Up Recommendations  No OT follow up    Equipment Recommendations  None recommended by OT       Precautions / Restrictions Precautions Precautions: Fall Restrictions Weight Bearing Restrictions: No      Mobility Bed Mobility Overal bed mobility: Modified Independent                Transfers Overall transfer level: Modified independent                         ADL Overall ADL's : Modified independent                                                       Pertinent Vitals/Pain Pain Score: 8  Pain Location: sciatica pain Pain Intervention(s): Limited activity within patient's tolerance     Hand Dominance Right   Extremity/Trunk Assessment Upper Extremity Assessment Upper Extremity Assessment: Overall WFL for tasks assessed           Communication Communication Communication: Expressive difficulties (mild)   Cognition Arousal/Alertness: Awake/alert Behavior During Therapy: WFL for tasks assessed/performed Overall Cognitive Status: Within Functional Limits for tasks assessed                                Home Living Family/patient expects to be discharged to:: Private residence Living Arrangements: Spouse/significant other Available Help at Discharge: Family;Available 24  hours/[place] Type of Home: Mobile home Home Access: Stairs to enter Entrance Stairs-Number of Steps: 4 Entrance Stairs-Rails: Right;Left Home Layout: One level     Bathroom Shower/Tub: Tub/shower unit (garden tub)   Bathroom Toilet: Standard     Home Equipment: Environmental consultant - 4 wheels;Shower seat;Grab bars - tub/shower          Prior Functioning/Environment Level of Independence: Independent with assistive device(s)        Comments: RW and left AFO    OT Diagnosis: Generalized weakness         OT Goals(Current goals can be found in the care plan section) Acute Rehab OT Goals Patient Stated Goal: home hopefully today  OT Frequency:             Co-evaluation PT/OT/SLP Co-Evaluation/Treatment: Yes     OT goals addressed during session: ADL's and self-care;Strengthening/ROM      End of Session Equipment Utilized During Treatment: Gait belt;Rolling walker Nurse Communication:  (agreed no need for chair alarm)  Activity Tolerance: Patient tolerated treatment well Patient left: in chair;with call bell/phone within reach   Time: 1426-1440 OT Time Calculation (min): 14 min Charges:  OT General Charges $OT Visit: 1 Procedure OT Evaluation $Initial  OT Evaluation Tier I: 1 Procedure  Almon Register [id number] 09/20/2015, 3:29 PM

## 2015-09-20 NOTE — Progress Notes (Signed)
PT Cancellation Note  Patient Details Name: [name] [name] MRN: [id number] DOB: 1961/03/30   Cancelled Treatment:    Reason Eval/Treat Not Completed: Patient at procedure or test/unavailable (Try again tomorrow.)   Ramond Dial 09/20/2015, 11:19 AM   Mee Hives, PT MS Acute Rehab Dept. Number: [facility] [id number] and [place] [phone]

## 2015-09-20 NOTE — Progress Notes (Signed)
Family Medicine Teaching Service Daily Progress Note Intern Pager: [phone]  Patient name: [name] [name] Medical record number: [id number] Date of birth: 11-Nov-1960 Age: 54 y.o. Gender: male  Primary Care Provider: Tawanna Sat, MD Consultants: Neuro Code Status: Full  Pt Overview and Major Events to Date:  11/11: patient admitted for new Rt weakness and aphasia; found to have CVA  Assessment and Plan: [name] [name] is a 54 y.o. male presenting with headache, right sided weakness, and difficulty finding words. 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 Neuro was ok continuing Xarelto. Right sided weakness, expressive aphasia improving. Repeat CT 11/12: Stable appearance of left MCA infarct. - ECHO, carotid dopplers: Neg - Last hemoglobin A1c (11/2013): 5.3%. Repeat in process.  - LDL 120; augmenting simvastatin to lipitor 80mg  - PT/OT/ST  AKI: Cr elevated to 1.66 on admission. Baseline creatinine 0.7-0.9. Improving. Will recheck today.  - DC IVF given improvement and tolerating diet.   COPD: No exacerbation.  - Continue home meds: Albuterol and Spiriva   HTN:. - Holding home Lisinopril 40mg  qd and HCTZ 25mg  qd for permissive HTN and w/AKI. Normotensive.   Tobacco abuse: Pt smoking 1-2 packs of cigarettes per [place] since he was a teenager. - Nicotine patch 21mg . Counseling provided.  Hx of Saddle PE: No hypoxemia - Continue Xarelto per Neuro recs  Depression/anxiety: Stable - Continue home Effexor-XR 150mg  qd  Sciatica: Pt with left calf atrophy and foot drop for the last 2 years. Uses a walker at baseline. - Continue home Lyrica, percocet, oxyIR  FEN/GI: Heart Healthy/ SLIV Prophylaxis:  Xarelto  Disposition: pending PT eval, likely D/C home 11/14  Subjective:  Better clinically. Less blocking and right side numbness/weakness now just tingling.   Objective: Temp:  [98 F (36.7 C)-98.9 F (37.2 C)] 98 F (36.7 C) (11/13 0915) Pulse Rate:  [55-65] 56 (11/13 0915) Resp:  [18] 18 (11/13 0915) BP: (93-126)/(52-72) 125/69 mmHg (11/13 0915) SpO2:  [96 %-100 %] 100 % (11/13 0915) Physical Exam: General: 54yo male in no distress Cardiovascular: RRR, no murmurs Respiratory: CTAB, no wheezes Neuro: Awake, alert; speech fluent; able to follow commands; 5/5 muscle strength in left upper and lower extremities, except for 0/5 muscle strength with flexion and extension at the left ankle; sensation intact throughout with reported tingling on right dorsal hand/arm  Laboratory:  Recent Labs Lab 09/18/15 1058 09/18/15 1144  WBC 16.0*  --   HGB 15.6 17.7*  HCT 45.7 52.0  PLT 288  --     Recent Labs Lab 09/18/15 1058 09/18/15 1144 09/19/15 0250  NA 137 139 136  K 3.9 3.9 2.8*  CL 103 105 101  CO2 22  --  24  BUN 25* 28* 23*  CREATININE 1.66* 1.60* 1.23  CALCIUM 9.8  --  9.0  PROT 7.9  --   --   BILITOT 0.6  --   --   ALKPHOS 97  --   --   ALT 21  --   --   AST 23  --   --   GLUCOSE 91 92 104*    Imaging/Diagnostic Tests: CT Head/C-spine 11/11 IMPRESSION: 1. Small to moderate-sized, acute to subacute left MCA infarct. No hemorrhage. 2. No acute abnormality identified  in the cervical spine. Solid C5-C7 ACDF.  MRI/MRA Head 11/11 IMPRESSION: Moderately large area of acute infarction affecting distal LEFT MCA territory including posterior frontal and parietal lobes, LEFT insula, and subcortical white matter. Sparing of the basal ganglia and periventricular white matter.  Early hemorrhagic petechial transformation of the acute infarct, in this patient on anticoagulants. This cannot be appreciated on prior CT, and may have developed since then, or be of  such in nature as to be inapparent on CT. Close surveillance is warranted to include repeat short-term follow-up with CT scanning to monitor for mass effect or lobar hemorrhage.  Unremarkable MRA intracranial. No features to suggest significant anterior circulation intracranial atherosclerotic change.  CT Head 11/12 IMPRESSION: Stable appearance of left MCA infarct.   Patrecia Pour, MD 09/20/2015, 12:45 PM PGY-3, [place] Intern pager: [phone], text pages welcome

## 2015-09-20 NOTE — Progress Notes (Signed)
STROKE TEAM PROGRESS NOTE   HISTORY [name] [name] is an 54 y.o. 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 Patient uses a cane due to chronic left sciatica issues. The night before admission night he had a sudden fall followed by weakness of right leg, expressive aphasia and decreased sensation on right arm and leg. He did not come to hospital due to fear of hospitals (his mother died while in [facility] hospital). 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   Date last known well: Date: 09/18/2015 Time last known well: Unable to determine tPA Given: No: on xeralto, unknown last seen well.   SUBJECTIVE (INTERVAL HISTORY) His family is not at the bedside.  Overall he feels his condition is gradually improving. There were no event overnight and he had no complaints for full ROS   OBJECTIVE Temp:  [98 F (36.7 C)-98.9 F (37.2 C)] 98 F (36.7 C) (11/13 0915) Pulse Rate:  [55-65] 56 (11/13 0915) Cardiac Rhythm:  [-] Sinus bradycardia (11/13 0700) Resp:  [18] 18 (11/13 0915) BP: (93-126)/(52-72) 125/69 mmHg (11/13 0915) SpO2:  [96 %-100 %] 100 % (11/13 0915)  CBC:   Recent Labs Lab 09/18/15 1058 09/18/15 1144  WBC 16.0*  --   NEUTROABS 12.1*  --   HGB 15.6 17.7*  HCT 45.7 52.0  MCV 94.8  --   PLT 288  --     Basic Metabolic Panel:   Recent Labs Lab 09/18/15 1058 09/18/15 1144 09/19/15 0250  NA 137 139 136  K 3.9 3.9 2.8*  CL 103 105 101  CO2 22  --  24  GLUCOSE 91 92 104*  BUN 25* 28* 23*  CREATININE 1.66* 1.60* 1.23  CALCIUM 9.8  --  9.0    Lipid Panel:     Component Value Date/Time   CHOL 185 09/19/2015 0250   TRIG 121 09/19/2015 0250   HDL 41 09/19/2015 0250   CHOLHDL 4.5 09/19/2015 0250   VLDL 24 09/19/2015 0250   LDLCALC 120* 09/19/2015 0250   HgbA1c:  Lab Results  Component Value Date   HGBA1C 5.3 11/20/2013   Urine Drug Screen:     Component Value Date/Time   LABOPIA NONE DETECTED 09/18/2015 1301   COCAINSCRNUR NONE DETECTED  09/18/2015 1301   LABBENZ NONE DETECTED 09/18/2015 1301   AMPHETMU NONE DETECTED 09/18/2015 1301   THCU NONE DETECTED 09/18/2015 1301   LABBARB POSITIVE* 09/18/2015 1301      IMAGING  Dg Forearm Right 09/18/2015   No fracture or dislocation. No appreciable joint space narrowing. Spur arising from the olecranon process of the proximal ulna.    Ct Head Wo Contrast 09/18/2015   1. Small to moderate-sized, acute to subacute left MCA infarct. No hemorrhage.  2. No acute abnormality identified in the cervical spine. Solid C5-C7 ACDF.   Ct Cervical Spine Wo Contrast 09/18/2015   1. Small to moderate-sized, acute to subacute left MCA infarct. No hemorrhage.  2. No acute abnormality identified in the cervical spine. Solid C5-C7 ACDF.     Mr Jodene Nam Head/brain Wo Cm 09/18/2015   Moderately large area of acute infarction affecting distal LEFT MCA territory including posterior frontal and parietal lobes, LEFT insula, and subcortical white matter. Sparing of the basal ganglia and periventricular white matter. Early hemorrhagic petechial transformation of the acute infarct, in this patient on anticoagulants. This cannot be appreciated on prior CT, and may have developed since then, or be of such in nature as to  be inapparent on CT. Close surveillance is warranted to include repeat short-term follow-up with CT scanning to monitor for mass effect or lobar hemorrhage.   Unremarkable MRA intracranial. No features to suggest significant anterior circulation intracranial atherosclerotic change.   Dg Humerus Right 09/18/2015   No fracture or dislocation.  No appreciable arthropathic change.   PHYSICAL EXAM General - Well nourished, well developed, in NAD   HEENT: wnl Cardiovascular - Regular rate and rhythm Pulmonary: CTA Abdomen: NT, ND, normal bowel sounds Extremities: No C/C/E  Neurological Exam Mental Status: Normal Orientation:  Oriented to person, place and time Speech:  Fluent; no  dysarthria Cranial Nerves:  PERRL; EOMI; visual fields full, face grossly symmetric, hearing grossly intact; shrug symmetric and tongue midline Motor Exam:  Tone:  Within normal limits; Strength: 5/5 throughout; atrophy of left calf muscle and there is chronic foot drop of left foot  Sensory: Intact to light touch throughout except for portion of right forearm and all of right hand  Coordination:  Intact finger to nose  Gait: Deferred  ASSESSMENT/PLAN [name] is a 54 y.o. male with history of Xarelto therapy for DVT and PE, chronic left sciatica issues, COPD, hypertension, tobacco abuse, and migraine headaches presenting after a sudden fall and subsequent right hemiparesis and expressive aphasia.  He did not receive IV t-PA due to Xarelto therapy.  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 Due to the fact that patient will need to be on anticoagulation for the history of bilateral DVT and PE, we will not need to consider TEE with bubble study or loop.  Management plan with anticoagulation will not change.  Radiology recommended that the CT would be repeated to "monitor" petechial changes in the ischemic stroke area.  Patient's CT yesterday was stable.  Patient with headache as the only complaint today and he states it is significant.  Recommend repeat CT of the head if headache worsens.  If headache improves or if CT needs to be repeated and is unchanged, there are no further recommendation for acute stroke care testing   Resultant  Right sided numbness and weakness  MRI - Moderately large area of acute infarction affecting distal LEFT MCA territory.  MRA - unremarkable  Carotid Dopple - Bilateral: 1-39% ICA stenosis. Vertebral artery flow is antegrade.  2D Echo EF 60-65%. No cardiac source of emboli identified.    LDL - 120  TSH - 0.141 (Low);  Recommend T3, T4  HgbA1c pending  Xarelto for VTE prophylaxis Diet Heart Room service appropriate?:  Yes; Fluid consistency:: Thin  Xarelto (rivaroxaban) daily prior to admission, now on Xarelto (rivaroxaban) daily  Patient counseled to be compliant with his antithrombotic medications  Ongoing aggressive stroke risk factor management  Therapy recommendations: Pending  Disposition:  Pending  Hypertension  Blood pressure running low but improving.  Currently not on anti hypertensive medsications.  Permissive hypertension (OK if < 220/120) but gradually normalize in 48-72 hours.  Today patient's BP is low.  Hyperlipidemia  Home meds:  Zocor 20 mg daily resumed in hospital  LDL 120, goal < 70  Would increase Zocor to 40 mg daily or change to lipitor.  Continue statin at discharge  Other Stroke Risk Factors  Cigarette smoker, advised to stop smoking.  Migraines  Other Active Problems  Hypokalemia - 2.8  Mildly elevated BUN 28 -> 23  Leukocytosis - 16.0 on admission; recommend follow-up   [facility] [place] PA-C [facility]  Pager [phone] 09/20/2015, 10:27 AM  NEUROLOGY ATTENDING NOTE Patient was seen and examined by me personally. I reviewed notes, independently viewed imaging studies, participated in medical decision making and plan of care. I have made additions or clarifications directly to the above note.  Documentation accurately reflects findings.  We discussed the fact that he has had DVT and PE in the past.  This certainly raises the question of hypercoagulable states.  Recommend chart review for comprehensive work-up of hypercoagulable states.  If none found, would pursue for the purposes of sharing the genetic information with the patient.  However, negative studies would not change the need for anticoagualtion.  ECHO should be done with bubble study  The laboratory and radiographic studies were personally reviewed by me.  ROS completed by me personally and pertinent positives fully documented.  Patient was without  complaints for full ROS  Assessment and plan completed by me personally and fully documented above.  Condition is improved   I spent 30 minutes of consultation care time in the care of  this patient.  SIGNED BY: Dr. Elissa Hefty     To contact Stroke Continuity provider, please refer to [email or web address] After hours, contact General Neurology

## 2015-09-20 NOTE — Evaluation (Signed)
Physical Therapy Evaluation Patient Details Name: [name] [name] MRN: [id number] DOB: 04/25/1961 Today's Date: 09/20/2015   History of Present Illness  54 Y/O m with PMX of DVT/PE, HTN,COPD,HLD, Depression who was sent to the hospital by his PCP after presenting with 1 [place] hx of speech difficulty and limb numbness. MRI: Moderately large area of acute infarction affecting distal LEFT MCA territory including posterior frontal and parietal lobes, LEFT insula, and subcortical white matter.  Clinical Impression  Pt is planning to get home today with a notable control of walker on level ground.  He did have some careful guarding of gait on stairs, with expectation that he will have assistance at home with continual visits from family and friends initially.  Will expect him to transition to independent living as able    Follow Up Recommendations Home health PT;Supervision/Assistance - 24 hour    Equipment Recommendations  Rolling walker with 5" wheels    Recommendations for Other Services       Precautions / Restrictions Precautions Precautions: Fall Restrictions Weight Bearing Restrictions: No      Mobility  Bed Mobility Overal bed mobility: Modified Independent                Transfers Overall transfer level: Modified independent Equipment used: Rolling walker (2 wheeled)             General transfer comment: uses hands and arms to control transfers  Ambulation/Gait Ambulation/Gait assistance: Min guard Ambulation Distance (Feet): 250 Feet Assistive device: Rolling walker (2 wheeled);1 person hand held assist (close guard recliner) Gait Pattern/deviations: Step-through pattern;Decreased stance time - left;Decreased dorsiflexion - left;Trunk flexed;Wide base of support Gait velocity: reduced Gait velocity interpretation: Below normal speed for age/gender General Gait Details: reminders for keeping walker close and to avoid obstacles  Stairs Stairs: Yes Stairs  assistance: Min guard Stair Management: Two rails;Step to pattern;Forwards Number of Stairs: 4 General stair comments: used RLE as stronger limb  Wheelchair Mobility    Modified Rankin (Stroke Patients Only) Modified Rankin (Stroke Patients Only) Pre-Morbid Rankin Score: Slight disability Modified Rankin: Moderate disability     Balance Overall balance assessment: Needs assistance Sitting-balance support: Feet supported Sitting balance-Leahy Scale: Good     Standing balance support: Bilateral upper extremity supported Standing balance-Leahy Scale: Fair Standing balance comment: using walker for gait control                             Pertinent Vitals/Pain Pain Assessment: 0-10 Pain Score: 8  Pain Location: L leg in sciatic distribution Pain Descriptors / Indicators: Headache;Radiating Pain Intervention(s): Limited activity within patient's tolerance;Monitored during session;Premedicated before session;Repositioned    Home Living Family/patient expects to be discharged to:: Private residence Living Arrangements: Spouse/significant other Available Help at Discharge: Family;Available 24 hours/[place] Type of Home: Mobile home Home Access: Stairs to enter Entrance Stairs-Rails: Right;Left Entrance Stairs-Number of Steps: 4 Home Layout: One level Home Equipment: Walker - 4 wheels;Cane - single point;Shower seat;Grab bars - toilet;Grab bars - tub/shower      Prior Function Level of Independence: Independent with assistive device(s)         Comments: RW and uses boot for control of L ankle weakness     Hand Dominance   Dominant Hand: Right    Extremity/Trunk Assessment   Upper Extremity Assessment: Overall WFL for tasks assessed           Lower Extremity Assessment: Generalized weakness  Cervical / Trunk Assessment: Normal  Communication   Communication: Expressive difficulties  Cognition Arousal/Alertness: Awake/alert Behavior During  Therapy: WFL for tasks assessed/performed Overall Cognitive Status: Within Functional Limits for tasks assessed                      General Comments General comments (skin integrity, edema, etc.): Has complaints about R hand being numb and better now but is still concerned about his residual symptoms    Exercises        Assessment/Plan    PT Assessment Patient needs continued PT services  PT Diagnosis Difficulty walking;Generalized weakness   PT Problem List Decreased strength;Decreased range of motion;Decreased activity tolerance;Decreased balance;Decreased mobility;Decreased coordination;Decreased knowledge of use of DME;Decreased safety awareness;Decreased knowledge of precautions;Obesity;Pain;Decreased skin integrity  PT Treatment Interventions DME instruction;Gait training;Stair training;Functional mobility training;Therapeutic activities;Therapeutic exercise;Balance training;Neuromuscular re-education;Patient/family education   PT Goals (Current goals can be found in the Care Plan section) Acute Rehab PT Goals Patient Stated Goal: home hopefully today PT Goal Formulation: With patient Time For Goal Achievement: 10/04/15 Potential to Achieve Goals: Good    Frequency Min 3X/week   Barriers to discharge Inaccessible home environment;Decreased caregiver support (has family/friends in pieces to help) will have a number of people checking in on him    Co-evaluation PT/OT/SLP Co-Evaluation/Treatment: Yes Reason for Co-Treatment: Complexity of the patient's impairments (multi-system involvement);For patient/therapist safety PT goals addressed during session: Mobility/safety with mobility;Balance;Proper use of DME OT goals addressed during session: ADL's and self-care       End of Session Equipment Utilized During Treatment: Gait belt Activity Tolerance: Patient tolerated treatment well Patient left: in chair;with call bell/phone within reach;with chair alarm set;Other  (comment) (with OT to finish) Nurse Communication: Mobility status;Precautions         Time: [id number] PT Time Calculation (min) (ACUTE ONLY): 29 min   Charges:   PT Evaluation $Initial PT Evaluation Tier I: 1 Procedure     PT G CodesRamond [name] 10-03-15, 4:20 PM   [name] [name], PT MS Acute Rehab Dept. Number: [facility] [id number] and [place] [phone]

## 2015-09-21 ENCOUNTER — Inpatient Hospital Stay (HOSPITAL_COMMUNITY): Payer: Medicaid Other

## 2015-09-21 DIAGNOSIS — F17200 Nicotine dependence, unspecified, uncomplicated: Secondary | ICD-10-CM | POA: Insufficient documentation

## 2015-09-21 DIAGNOSIS — Z720 Tobacco use: Secondary | ICD-10-CM | POA: Insufficient documentation

## 2015-09-21 DIAGNOSIS — I69320 Aphasia following cerebral infarction: Secondary | ICD-10-CM

## 2015-09-21 DIAGNOSIS — I639 Cerebral infarction, unspecified: Secondary | ICD-10-CM

## 2015-09-21 LAB — HEMOGLOBIN A1C
Hgb A1c MFr Bld: 5.7 % — ABNORMAL HIGH (ref 4.8–5.6)
Mean Plasma Glucose: 117 mg/dL

## 2015-09-21 MED ORDER — ATORVASTATIN CALCIUM 80 MG PO TABS: 80.0000 mg | ORAL_TABLET | Freq: Every day | ORAL | Status: DC

## 2015-09-21 NOTE — Progress Notes (Signed)
Pt given discharge instructions. IV removed and telemetry discontinued. Pt aware to discharge this evening. Pt's driver with car trouble. Attempting to get rental car. Charge nurse notified. Wendee Copp

## 2015-09-21 NOTE — Progress Notes (Signed)
        TRANSCRANIAL DOPPLER BUBBLE STUDY   [name] Date of Birth:  18-Jul-1961 Medical Record Number:  [id number]   Indications: Diagnostic Date of Procedure: 09/21/2015 Clinical History:   54 year male with embolic stroke Technical Description:   Transcranial Doppler Bubble Study was performed at the bedside after taking written informed consent from the patient and explaining risk/benefits. The right middle cerebral arteriy were insonated using a hand held 2 MHz probe. And IV line was inserted in the right forearm by the RN using aseptic precautions. Agitated saline injection at rest and after valsalva maneuver  Did not result in high intensity transient signals (HITS).   Impression:  Negative Transcranial Doppler Bubble Study not indicative of right to left intracardiac shunt.   Results were explained to the patient. Questions were answered.  Antony Contras, MD Medical Director [facility] Stroke Center Pager: [phone] 09/21/2015 4:38 PM

## 2015-09-21 NOTE — Progress Notes (Signed)
*  PRELIMINARY RESULTS* Vascular Ultrasound Transcranial Doppler with Bubbles has been completed.   No High Intensity Transient Signals (HITS) were heard at rest or with valsalva, therefore there is no obvious evidence of PFO.  09/21/2015 3:39 PM Maudry Mayhew, RVT, RDCS, RDMS

## 2015-09-21 NOTE — Progress Notes (Signed)
STROKE TEAM PROGRESS NOTE   SUBJECTIVE (INTERVAL HISTORY) Patient siting up in the chair in the room. Looking forward to discharge today. No complaints. He is unaware of why he has a clotting history.   OBJECTIVE Temp:  [97.6 F (36.4 C)-99.5 F (37.5 C)] 97.8 F (36.6 C) (11/14 0452) Pulse Rate:  [48-62] 48 (11/14 0452) Cardiac Rhythm:  [-] Sinus bradycardia (11/14 0700) Resp:  [18-20] 20 (11/14 0452) BP: (119-179)/(72-93) 142/83 mmHg (11/14 0452) SpO2:  [96 %-100 %] 97 % (11/14 0452) Weight:  [104.5 kg (230 lb 6.1 oz)] 104.5 kg (230 lb 6.1 oz) (11/13 1700)  CBC:   Recent Labs Lab 09/18/15 1058 09/18/15 1144  WBC 16.0*  --   NEUTROABS 12.1*  --   HGB 15.6 17.7*  HCT 45.7 52.0  MCV 94.8  --   PLT 288  --     Basic Metabolic Panel:   Recent Labs Lab 09/19/15 0250 09/20/15 1500  NA 136 138  K 2.8* 4.4  CL 101 109  CO2 24 23  GLUCOSE 104* 100*  BUN 23* 17  CREATININE 1.23 0.96  CALCIUM 9.0 8.4*    Lipid Panel:     Component Value Date/Time   CHOL 185 09/19/2015 0250   TRIG 121 09/19/2015 0250   HDL 41 09/19/2015 0250   CHOLHDL 4.5 09/19/2015 0250   VLDL 24 09/19/2015 0250   LDLCALC 120* 09/19/2015 0250   HgbA1c:  Lab Results  Component Value Date   HGBA1C 5.7* 09/19/2015   Urine Drug Screen:     Component Value Date/Time   LABOPIA NONE DETECTED 09/18/2015 1301   COCAINSCRNUR NONE DETECTED 09/18/2015 1301   LABBENZ NONE DETECTED 09/18/2015 1301   AMPHETMU NONE DETECTED 09/18/2015 1301   THCU NONE DETECTED 09/18/2015 1301   LABBARB POSITIVE* 09/18/2015 1301     IMAGING  Dg Forearm Right 09/18/2015   No fracture or dislocation. No appreciable joint space narrowing. Spur arising from the olecranon process of the proximal ulna.    Ct Head Wo Contrast 09/18/2015   1. Small to moderate-sized, acute to subacute left MCA infarct. No hemorrhage.  2. No acute abnormality identified in the cervical spine. Solid C5-C7 ACDF.   Ct Cervical Spine Wo  Contrast 09/18/2015   1. Small to moderate-sized, acute to subacute left MCA infarct. No hemorrhage.  2. No acute abnormality identified in the cervical spine. Solid C5-C7 ACDF.   Mri Head/brain Wo Cm 09/18/2015   Moderately large area of acute infarction affecting distal LEFT MCA territory including posterior frontal and parietal lobes, LEFT insula, and subcortical white matter. Sparing of the basal ganglia and periventricular white matter. Early hemorrhagic petechial transformation of the acute infarct, in this patient on anticoagulants. This cannot be appreciated on prior CT, and may have developed since then, or be of such in nature as to be inapparent on CT.Marland Kitchen   Unremarkable MRA intracranial. 09/18/2015   No features to suggest significant anterior circulation intracranial atherosclerotic change.   Dg Humerus Right 09/18/2015   No fracture or dislocation.  No appreciable arthropathic change.    PHYSICAL EXAM General - Well nourished, well developed, in NAD   HEENT: wnl Cardiovascular - Regular rate and rhythm Pulmonary: CTA Abdomen: NT, ND, normal bowel sounds Extremities: No C/C/E  Neurological Exam Mental Status: Normal Orientation:  Oriented to person, place and time Speech:  Mild nonfluent speech; no dysarthria. Mild word finding difficulties and occasional paraphasic errors Cranial Nerves:  PERRL; EOMI; visual fields full, face grossly  symmetric, hearing grossly intact; shrug symmetric and tongue midline Motor Exam:  Tone:  Within normal limits; Strength: 5/5 throughout; atrophy of left calf muscle and there is chronic foot drop of left foot Sensory: Intact to light touch throughout except for portion of right forearm and all of right hand Coordination:  Intact finger to nose Gait: Deferred   ASSESSMENT/PLAN [name] [name] is a 54 y.o. male with history of Xarelto therapy for DVT and PE, chronic left sciatica issues, COPD, hypertension, tobacco abuse, and migraine  headaches presenting after a sudden fall and subsequent right hemiparesis and expressive aphasia.  He did not receive IV t-PA due to Xarelto therapy.  Stroke:  Dominant L MCA infarct with petechial hemorrhage while on xarelto (though not taking routinely, missed dose [place] before admission). infarct embolic from an unknown source.   Resultant  Right sided numbness and weakness, headache  MRI - Moderately large area of acute infarction affecting distal LEFT MCA territory.  MRA - unremarkable  Carotid Dopple - Bilateral: 1-39% ICA stenosis. Vertebral artery flow is antegrade.  2D Echo EF 60-65%. No cardiac source of emboli identified.    No TEE given anticoagulation for history of bilateral DVT and PE. Will not change management   Check TCD bubble study. If bubble study positive for PFO, can consider TEE as an OP if  closure considered. Ordered. (will not delay discharge)  LDL - 120  TSH - 0.141 (Low);  Recommend T3, T4   HgbA1c 5.7  Xarelto for VTE prophylaxis Diet Heart Room service appropriate?: Yes; Fluid consistency:: Thin  Xarelto (rivaroxaban) daily prior to admission but was not taking it routinely, had missed the dose the [place] prior to admission;  now on Xarelto (rivaroxaban) daily  Patient counseled to be compliant with his antithrombotic medications  Ongoing aggressive stroke risk factor management  Therapy recommendations: HHPT, OP ST, no PT needs  Disposition:  Discharge home with OP therapies  Follow up Dr. Leonie [name] in 2 months. Order written  Hypertension  Stable off anti hypertensive medications.  Hyperlipidemia  Home meds:  Zocor 20 mg daily   LDL 120, goal < 70  changed to lipitor 80 mg   Continue statin at discharge  Other Stroke Risk Factors  Cigarette smoker, advised to stop smoking.  Migraines  Other Active Problems  Hypokalemia - 2.8->4.4  AKI - BUN normalized; 28 -> 23>17  COPD, stable  Hx saddle PE 2 years ago per pt, on xarelto,  but has been missing doses  Depression/anxiety  Leukocytosis - 16.0 on admission; recommend follow-up  Sciatica  [name] # 3   per Dr. Belenda [name] -  Recommend chart review for comprehensive work-up of hypercoagulable states.  If none found, would pursue for the purposes of sharing the genetic information with the patient.  [place] for Pager information 09/21/2015 11:23 AM  I have personally examined this patient, reviewed notes, independently viewed imaging studies, participated in medical decision making and plan of care. I have made any additions or clarifications directly to the above note. Agree with note above. Interesting scenario suggestive of paradoxical embolism. The patient clearly states that he was not compliant with Xarelto and probably missed 1 or 2 doses prior to his presentation. I recommend we check transcranial Doppler bubble study to look for PFO and if confirmed may need to consider endovascular PFO closure particularly if he has recurrent TIAs/strokes on therapeutic anticoagulation. He remains at risk for recurrent stroke, TIAs and  needs ongoing evaluation  Antony Contras, MD Medical Director [place] Pager: [phone] 09/21/2015 1:36 PM  To contact Stroke Continuity provider, please refer to [email or web address] After hours, contact General Neurology

## 2015-09-21 NOTE — Care Management Note (Signed)
Case Management Note  Patient Details  Name: [name] [name] MRN: [id number] Date of Birth: 1961-08-12  Subjective/Objective:                    Action/Plan: Met with patient to discuss discharge planning. Patient will be discharging home with HHPT and has chosen [facility].  Miranda with [facility] was notified and has accepted the referral. Patient's preferred contact number is [phone].  Patient has all necessary DME at home.  Expected Discharge Date:                  Expected Discharge Plan:  [name]  In-House Referral:     Discharge planning Services  CM Consult  Post Acute Care Choice:  Home Health Choice offered to:  Patient  DME Arranged:    DME Agency:     HH Arranged:  PT [place]:  [place]  Status of Service:  Completed, signed off  Medicare Important Message Given:    Date Medicare IM Given:    Medicare IM give by:    Date Additional Medicare IM Given:    Additional Medicare Important Message give by:     If discussed at [place] of Stay Meetings, dates discussed:    Additional CommentsRolm Baptise, RN 09/21/2015, 2:05 PM [phone]

## 2015-09-21 NOTE — Progress Notes (Signed)
Family Medicine Teaching Service Daily Progress Note Intern Pager: [phone]  Patient name: [name] [name] Medical record number: [id number] Date of birth: [birth year] Age: 54 y.o. Gender: male  Primary Care Provider: Tawanna Sat, MD Consultants: Neuro Code Status: Full  Pt Overview and Major Events to Date:  11/11: patient admitted for new Rt weakness and aphasia; found to have CVA  Assessment and Plan: [name] [name] is a 54 y.o. male presenting with headache, right sided weakness, and difficulty finding words. 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 Neuro was okay continuing Xarelto. Right sided weakness, expressive aphasia much improved since admission. Repeat CT 11/12: Stable appearance of left MCA infarct. - ECHO: EF 60-65%, normal wall motion, mildly calcified leaflets of aortic valve. - Last hemoglobin A1c (11/2013): 5.3%. Repeat in process.  - LDL 120; augmenting simvastatin 20mg  to lipitor 80mg  - PT recommended HHPT, rolling walk with 5" wheels - OT recommended no OT follow-up - ST recommended outpatient SLP - Will touch base with Neurology today, given that he has possibly failed Xarelto and we have found no other possible source of his stroke.  AKI, resolved: Cr elevated to 1.66 on admission. Baseline creatinine 0.7-0.9. Cr improved to 0.96 on 11/13. - Continue to monitor   COPD: No exacerbation.  - Continue home meds: Albuterol and Spiriva   HTN:  - Have been holding home Lisinopril 40mg  qd and HCTZ 25mg  qd for permissive HTN and w/ AKI.  - Would recommend restarting these as an outpatient. - Will continue to monitor   Tobacco abuse: Pt smoking 1-2 packs of cigarettes per [name] [name] since  he was a teenager. - Nicotine patch 21mg . Counseling provided.  Hx of Saddle PE: No hypoxemia - Continue Xarelto per Neuro recs  Depression/anxiety: Stable -Continue home Effexor-XR 150mg  qd  Sciatica: Pt with left calf atrophy and foot drop for the last 2 years. Uses a walker at baseline. - Continue home Lyrica, percocet, oxyIR  FEN/GI: Heart Healthy/ SLIV Prophylaxis: Xarelto  Disposition: Will discharge home today.  Subjective:  Pt is doing well this am. States he is still having a headache. His numbness and weakness on his right side have gotten better. He feels like he can find words more easily. He plans to read out loud to his dogs when he gets home to practice his speech. Yesterday, he ambulated with PT down the hall and up and down some stairs. He states that it went really well and he feels back to normal. He would like to go home today.  Objective: Temp:  [97.6 F (36.4 C)-99.5 F (37.5 C)] 97.8 F (36.6 C) (11/14 0452) Pulse Rate:  [48-62] 48 (11/14 0452) Resp:  [18-20] 20 (11/14 0452) BP: (119-179)/(69-93) 142/83 mmHg (11/14 0452) SpO2:  [96 %-100 %] 97 % (11/14 0452) Weight:  [230 lb 6.1 oz (104.5 kg)] 230 lb 6.1 oz (104.5 kg) (11/13 1700) Physical Exam: General: Laying in bed, pleasant and conversational, in NAD Cardiovascular: RRR, no murmurs Respiratory: CTAB, no wheezes Neuro: Awake, alert; speech fluent; able to follow commands; 5/5 muscle strength in upper and lower extremities bilaterally except for 0/5 muscle strength in the left ankle, which is baseline for him.   Laboratory:  Recent Labs Lab 09/18/15 1058  09/18/15 1144  WBC 16.0*  --   HGB 15.6 17.7*  HCT 45.7 52.0  PLT 288  --     Recent Labs Lab 09/18/15 1058 09/18/15 1144 09/19/15 0250 09/20/15 1500  NA 137 139 136 138  K 3.9 3.9 2.8* 4.4  CL 103 105 101 109  CO2 22  --  24 23  BUN 25* 28* 23* 17  CREATININE 1.66* 1.60* 1.23 0.96  CALCIUM 9.8  --  9.0 8.4*  PROT 7.9  --   --    --   BILITOT 0.6  --   --   --   ALKPHOS 97  --   --   --   ALT 21  --   --   --   AST 23  --   --   --   GLUCOSE 91 92 104* 100*    Imaging/Diagnostic Tests: CT Head/C-spine 11/11 IMPRESSION: 1. Small to moderate-sized, acute to subacute left MCA infarct. No hemorrhage. 2. No acute abnormality identified in the cervical spine. Solid C5-C7 ACDF.  MRI/MRA Head 11/11 IMPRESSION: Moderately large area of acute infarction affecting distal LEFT MCA territory including posterior frontal and parietal lobes, LEFT insula, and subcortical white matter. Sparing of the basal ganglia and periventricular white matter.  Early hemorrhagic petechial transformation of the acute infarct, in this patient on anticoagulants. This cannot be appreciated on prior CT, and may have developed since then, or be of such in nature as to be inapparent on CT. Close surveillance is warranted to include repeat short-term follow-up with CT scanning to monitor for mass effect or lobar hemorrhage.  Unremarkable MRA intracranial. No features to suggest significant anterior circulation intracranial atherosclerotic change.  CT Head 11/12 IMPRESSION: Stable appearance of left MCA infarct.   Sela Hua, MD 09/21/2015, 6:59 AM PGY-1, [facility] Intern pager: [phone], text pages welcome

## 2015-09-21 NOTE — Discharge Instructions (Signed)
You were hospitalized because you had a stroke in the left side of your brain. Your symptoms seemed to greatly improve throughout your hospitalization.   To prevent strokes from occurring in the future: 1. Please take Lipitor 80mg  every [place] instead of Zocor. 2. Please take your Xarelto every [place]. 3. Do not take your Lisinopril or HCTZ for now. We want your blood pressure to be a little higher than normal this week to allow for good blood flow to your brain. These medications will likely be restarted at your hospital follow-up appointment on 11/18.   -We have set you up with a home health physical therapist who will come to your house and help you become stronger. -At your hospital follow-up appointment, we will refer you to speech therapy clinic.

## 2015-09-21 NOTE — Progress Notes (Signed)
Speech Language Pathology Treatment: Cognitive-Linquistic  Patient Details Name: [name] [name] MRN: [id number] DOB: 26-Feb-1961 Today's Date: 09/21/2015 Time: [id number] SLP Time Calculation (min) (ACUTE ONLY): 44 min  Assessment / Plan / Recommendation Clinical Impression  Pt seen for skilled SLP to aid pt in maximizing expressive communication abilities.  No paraphasias noted today during entire conversation however pauses with dysfluency with initial sound repetition x5 during entire session *worsening toward end of lengthy session*.   Rate of speech is slow which pt reports is compensatory for him.    Significant improvements noted compared to initial evaluation on 09/17/13.    SLP provided pt with functional tasks including writing words of certain categories that are pertinent to his daily life - he required mod I to complete 4/6 puzzles - admitting it was more difficult currently than prior to event.    Providing paraphrasing of short story was mildly difficult for pt with him benefiting from min ? Cues to provide details.    Review of medication administration completed and pt provided with written memory strategies to maximize his compensation abilities for possible deficits that he may not be aware of at this time.  Pt showed good awareness to possible deficits not noted in current environment - great insight!  Pt will benefit from follow up SLP OP for assisting expressive communication - pt agreeable.   Provided pt with compensation strategies and activities to complete pending further SLP input.  Thanks for allowing me to help with this pleasant pt.    HPI HPI: Pt is a 54 yo male with PMH of COPD, PE, HTN, who presents after sudden fall followed by right-sided weakness/numbness and expressive aphasia. CT head shows acute left parietal infarct.  Further acute changes have been identified via MRI of brain including moderately large area of acute infarction affecting distal LEFT MCA territory  including posterior frontal and parietal lobes, LEFT insula, and subcortical white matter in addition to early hemorrhagic petechial transformation of the acute infarct.  ST to re evaluate swallow function given further acute neurological changes.        SLP Plan  Continue with current plan of care     Recommendations   OP SLP               Oral Care Recommendations: Oral care BID Follow up Recommendations: Outpatient SLP Plan: Continue with current plan of care   Luanna Salk, [place] SLP [phone]

## 2015-09-21 NOTE — Progress Notes (Signed)
Physical Therapy Treatment Patient Details Name: [name] [name] MRN: [id number] DOB: [birth year] Today's Date: 09/21/2015    History of Present Illness 54 Y/O m with PMX of DVT/PE, HTN,COPD,HLD, Depression who was sent to the hospital by his PCP after presenting with 1 [place] hx of speech difficulty and limb numbness. MRI: Moderately large area of acute infarction affecting distal LEFT MCA territory including posterior frontal and parietal lobes, LEFT insula, and subcortical white matter.    PT Comments    Pt progressing towards physical therapy goals. Was able to perform transfers and ambulation with modified independence to supervision for safety. Sciatic pain was the limiting factor during session. Pt again demonstrating slow and guarded mobility on the stairs, but did not require any assistance. Pt reinforced ample help available at home, however continue to feel that HHPT would be beneficial to address both residual deficits from stroke as well as sciatic pain.   Follow Up Recommendations  Home health PT;Supervision/Assistance - 24 hour     Equipment Recommendations  Rolling walker with 5" wheels    Recommendations for Other Services       Precautions / Restrictions Precautions Precautions: Fall Restrictions Weight Bearing Restrictions: No    Mobility  Bed Mobility Overal bed mobility: Modified Independent                Transfers Overall transfer level: Modified independent Equipment used: Rolling walker (2 wheeled)             General transfer comment: Pt demonstrater proper hand placement and safety awareness.   Ambulation/Gait Ambulation/Gait assistance: Supervision Ambulation Distance (Feet): 250 Feet Assistive device: Rolling walker (2 wheeled) Gait Pattern/deviations: Step-through pattern;Decreased stride length;Trunk flexed Gait velocity: Decreased Gait velocity interpretation: Below normal speed for age/gender General Gait Details: VC's for improved  posture and fluidity of walker movement.    Stairs Stairs: Yes Stairs assistance: Min guard Stair Management: Two rails;Step to pattern;Forwards Number of Stairs: 5 General stair comments: used RLE as stronger limb. pt demonstrated proper sequencing.   Wheelchair Mobility    Modified Rankin (Stroke Patients Only) Modified Rankin (Stroke Patients Only) Pre-Morbid Rankin Score: Slight disability Modified Rankin: Moderately severe disability     Balance Overall balance assessment: Needs assistance Sitting-balance support: Feet supported;No upper extremity supported Sitting balance-Leahy Scale: Good     Standing balance support: No upper extremity supported Standing balance-Leahy Scale: Fair Standing balance comment: Pt able to stand at sink and wash hands without assistance.                     Cognition Arousal/Alertness: Awake/alert Behavior During Therapy: WFL for tasks assessed/performed Overall Cognitive Status: Within Functional Limits for tasks assessed                      Exercises      General Comments        Pertinent Vitals/Pain Pain Assessment: Faces Pain Score: 7  Faces Pain Scale: Hurts even more Pain Location: LLE in sciatic distribution Pain Descriptors / Indicators: Radiating Pain Intervention(s): Limited activity within patient's tolerance;Monitored during session;Repositioned    Home Living     Available Help at Discharge: Family;Available 24 hours/[place] Type of Home: Mobile home              Prior Function            PT Goals (current goals can now be found in the care plan section) Acute Rehab PT Goals Patient Stated Goal:  home hopefully today PT Goal Formulation: With patient Time For Goal Achievement: 10/04/15 Potential to Achieve Goals: Good Progress towards PT goals: Progressing toward goals    Frequency  Min 3X/week    PT Plan Current plan remains appropriate    Co-evaluation             End  of Session Equipment Utilized During Treatment: Gait belt Activity Tolerance: Patient tolerated treatment well Patient left: in chair;with call bell/phone within reach     Time: 1042-1058 PT Time Calculation (min) (ACUTE ONLY): 16 min  Charges:  $Gait Training: 8-22 mins                    G Codes:      Rolinda Roan 24-Sep-2015, 11:06 AM  Rolinda Roan, PT, DPT Acute Rehabilitation Services Pager: [phone]

## 2015-09-21 NOTE — Discharge Summary (Signed)
[facility] Discharge Summary  Patient name: Jayant Piech Medical record number: [id number] Date of birth: June 01, 1961 Age: 54 y.o. Gender: male Date of Admission: 09/18/2015  Date of Discharge: 09/21/15 Admitting Physician: Kinnie Feil, MD  Primary Care Provider: Tawanna Sat, MD Consultants: Neurology  Indication for Hospitalization: Acute L MCA CVA  Discharge Diagnoses/Problem List:  CVA Expressive aphasia AKI Fall COPD Tobacco use disorder HTN Depression/anxiety  Disposition: Home with HHPT and outpatient SLP.  Discharge Condition: Stable, improved  Discharge Exam:  General: Laying in bed, pleasant and conversational, in NAD Cardiovascular: RRR, no murmurs Respiratory: CTAB, no wheezes Neuro: Awake, alert; speech fluent; able to follow commands; 5/5 muscle strength in upper and lower extremities bilaterally except for 0/5 muscle strength in the left ankle, which is baseline for him.   Brief Hospital Course:  Daiyaan Burak is a 54 year old male who presented to his PCP in clinic after falling, hitting his head, and experiencing headache, right sided weakness, and difficulty finding words. CT head showed a small to moderate-sized acute to subacute left MCA infarct, no hemorrhage. He takes Xarelto for a history of saddle PE. Neuro was consulted and recommended continuing Xarelto. A full stroke work-up was performed. MRI/MRA head was significant for moderately large area of acute infarct affecting the distal left MCA territory including the posterior frontal and parietal lobes, left insula, and subcortical white matter. Early hemorrhagic petechian transformation of the acute infarct was seen. ECHo showed EF 60-65%, normal wall motion, mildly calcified leaflets of the aortic valve. HgbA1c was 5.7%. Lipid panel was significant for: Chol 185, TG 121, HDL 41, LDL 120. We switched his statin from Simvastatin 20mg  to Lipitor 80mg . He was evaluated by PT, who  recommended HHPT and a rolling walker with 5" wheels. OT did not recommend any OT follow-up. Speech recommended outpatient SLP. Stroke work-up did not show any cause of his stroke. Per Neuro, he told them he missed a few doses of Xarelto and was counseled on the importance of taking this medication daily. Neuro performed a transcranial doppler bubble study to look for PFO. The study was negative and was not indicative of a right to left intracardiac shunt. No cause of his recurrent strokes were found. He was continued on Xarelto. We spoke with Neurology about possibly adding Aspirin, but they stated that there is no evidence to suggest that ASA + Xarelto will prevent a future stroke, instead, it will just increase his risk of bleed. Mr. Cung should follow-up in Neuro clinic as an outpatient.  Mr. Trejos presented with a mild AKI, likely from vomiting x 1 [place] prior to admission. His Cr on admission was 1.66 (baseline 0.7-0.9). His Cr improved to 0.96 on the [place] before discharge.   We held Mr. Dize home Lisinopril 40mg  qd and HCTZ 25mg  qd for permissive HTN and in the setting of AKI. His blood pressures ranged in the [id number] systolic. We recommend that these be restarted as an outpatient.  Issues for Follow Up:  1. TSH was found to be 0.141. Pt has a family history significant for hyperthyroidism. We recommend repeating TSH as an outpatient, as this value may not be accurate in the setting of acute CVA. 2. Please refer Pt to outpatient speech therapy 3. Held HCTZ 25mg  qd and Lisinopril 40mg  qd for permissive hypertension after a stroke. Please add these back if it is appropriate. 4. We changed his Simvastatin 20mg  to Lipitor 80mg . Please make sure he is tolerating this  change.  Significant Procedures: Transcranial doppler bubble study- negative for PFO.  Significant Labs and Imaging:   Recent Labs Lab 09/18/15 1058 09/18/15 1144  WBC 16.0*  --   HGB 15.6 17.7*  HCT 45.7 52.0  PLT 288  --      Recent Labs Lab 09/18/15 1058 09/18/15 1144 09/19/15 0250 09/20/15 1500  NA 137 139 136 138  K 3.9 3.9 2.8* 4.4  CL 103 105 101 109  CO2 22  --  24 23  GLUCOSE 91 92 104* 100*  BUN 25* 28* 23* 17  CREATININE 1.66* 1.60* 1.23 0.96  CALCIUM 9.8  --  9.0 8.4*  ALKPHOS 97  --   --   --   AST 23  --   --   --   ALT 21  --   --   --   ALBUMIN 4.6  --   --   --    UA: rare bacteria, 15 ketones, negative leukocytes, negative nitrites, 0-2 WBC UDS: positive for barbiturates  Ethyl alcohol: <5 HgbA1c: 5.7% TSH: 0.141 Lipid Panel: Chol 185, TG 121, HDL 41, LDL 120 i-stat trop: 0.00  Results/Tests Pending at Time of Discharge: None  Discharge Medications:    Medication List    ASK your doctor about these medications        albuterol 108 (90 BASE) MCG/ACT inhaler  Commonly known as:  PROVENTIL HFA;VENTOLIN HFA  Inhale 2 puffs into the lungs every 4 (four) hours as needed for wheezing.     butalbital-acetaminophen-caffeine 50-325-40 MG tablet  Commonly known as:  FIORICET, ESGIC  Take 1 tablet by mouth 2 (two) times daily as needed for headache.     cyclobenzaprine 10 MG tablet  Commonly known as:  FLEXERIL  TAKE ONE TABLET BY MOUTH THREE TIMES DAILY AS NEEDED FOR MUSCLE SPASM     finasteride 5 MG tablet  Commonly known as:  PROSCAR  Take 1 tablet (5 mg total) by mouth daily.     hydrochlorothiazide 25 MG tablet  Commonly known as:  HYDRODIURIL  TAKE 1 TABLET BY MOUTH EVERY [place]     hydrocortisone 2.5 % ointment  Apply topically 2 (two) times daily.     lisinopril 40 MG tablet  Commonly known as:  PRINIVIL,ZESTRIL  Take 1 tablet (40 mg total) by mouth daily.     loratadine 10 MG tablet  Commonly known as:  CLARITIN  Take 1 tablet (10 mg total) by mouth daily.     LORazepam 2 MG tablet  Commonly known as:  ATIVAN  TAKE 1 TABLET BY MOUTH TWICE DAILY AS NEEDED FOR ANXIETY     oxyCODONE-acetaminophen 10-325 MG tablet  Commonly known as:  PERCOCET  Take 1  tablet by mouth every 4 (four) hours as needed for pain. Refill after 09/28/2015     pregabalin 150 MG capsule  Commonly known as:  LYRICA  Take 1 capsule (150 mg total) by mouth 2 (two) times daily.     rivaroxaban 20 MG Tabs tablet  Commonly known as:  XARELTO  TAKE 1 TABLET BY MOUTH EVERY [place] WITH SUPPER     simvastatin 20 MG tablet  Commonly known as:  ZOCOR  Take 1 tablet (20 mg total) by mouth daily at 6 PM.     tiotropium 18 MCG inhalation capsule  Commonly known as:  SPIRIVA  Place 18 mcg into inhaler and inhale daily.     traZODone 100 MG tablet  Commonly known as:  DESYREL  Take 1 tablet (100 mg total) by mouth at bedtime.     venlafaxine XR 150 MG 24 hr capsule  Commonly known as:  EFFEXOR-XR  TAKE 1 CAPSULE BY MOUTH DAILY        Discharge Instructions: Please refer to Patient Instructions section of EMR for full details.  Patient was counseled important signs and symptoms that should prompt return to medical care, changes in medications, dietary instructions, activity restrictions, and follow up appointments.   Follow-Up Appointments:     Follow-up Information    Follow up with SETHI,PRAMOD, MD In 2 months.   Specialties:  Neurology, Radiology   Why:  Stroke Clinic, Office will call you with appointment date & time   Contact information:   [place] [id number] [place], MD 09/21/2015, 11:37 AM PGY-1, [place]

## 2015-09-25 ENCOUNTER — Ambulatory Visit (INDEPENDENT_AMBULATORY_CARE_PROVIDER_SITE_OTHER): Payer: Medicaid Other | Admitting: Family Medicine

## 2015-09-25 ENCOUNTER — Encounter: Payer: Self-pay | Admitting: Family Medicine

## 2015-09-25 ENCOUNTER — Inpatient Hospital Stay: Payer: Medicaid Other | Admitting: Family Medicine

## 2015-09-25 VITALS — BP 156/94 | HR 94 | Temp 98.1°F | Ht 71.0 in | Wt 238.0 lb

## 2015-09-25 DIAGNOSIS — Z720 Tobacco use: Secondary | ICD-10-CM

## 2015-09-25 DIAGNOSIS — E785 Hyperlipidemia, unspecified: Secondary | ICD-10-CM | POA: Diagnosis not present

## 2015-09-25 DIAGNOSIS — D72829 Elevated white blood cell count, unspecified: Secondary | ICD-10-CM | POA: Insufficient documentation

## 2015-09-25 DIAGNOSIS — R946 Abnormal results of thyroid function studies: Secondary | ICD-10-CM

## 2015-09-25 DIAGNOSIS — I63419 Cerebral infarction due to embolism of unspecified middle cerebral artery: Secondary | ICD-10-CM | POA: Diagnosis not present

## 2015-09-25 DIAGNOSIS — I10 Essential (primary) hypertension: Secondary | ICD-10-CM

## 2015-09-25 DIAGNOSIS — R7989 Other specified abnormal findings of blood chemistry: Secondary | ICD-10-CM | POA: Insufficient documentation

## 2015-09-25 DIAGNOSIS — I889 Nonspecific lymphadenitis, unspecified: Secondary | ICD-10-CM | POA: Diagnosis not present

## 2015-09-25 MED ORDER — OXYCODONE HCL 5 MG PO TABS: 5.0000 mg | ORAL_TABLET | Freq: Two times a day (BID) | ORAL | Status: DC | PRN

## 2015-09-25 MED ORDER — LISINOPRIL 20 MG PO TABS: 20.0000 mg | ORAL_TABLET | Freq: Every day | ORAL | Status: DC

## 2015-09-25 MED ORDER — CLINDAMYCIN HCL 300 MG PO CAPS
300.0000 mg | ORAL_CAPSULE | Freq: Three times a day (TID) | ORAL | Status: DC
Start: 2015-09-25 — End: 2016-01-21

## 2015-09-25 NOTE — Assessment & Plan Note (Signed)
MRI brain reviewed and discussed with patient as he had many questions about it. F/U with neurologist as planned. Start PT in 2 days. I referred to speech therapist. Continue Xarelto and Statin as recommended. Return precaution discussed.

## 2015-09-25 NOTE — Assessment & Plan Note (Signed)
Compliant with Lipitor 80 mg with no S/E to med.

## 2015-09-25 NOTE — Assessment & Plan Note (Signed)
I commended him on cutting back and counseled him for about 5 min on cessation. He will continue to work on this.

## 2015-09-25 NOTE — Patient Instructions (Signed)
It was nice seeing you today. I am sorry you have pain on your right arm, legs and back. I will give you Oxy IR for few days only for breakthrough pain till you are able to get back with your PCP. Please continue Percocet as instructed. I will refer you to speech therapist. Please ensure follow up with neurologist as instructed. Start Lisinopril 20 mg qd for your BP. Continue monitoring BP at home and keep it between 110/70 to 145/80. See your PCP back in 2 wks for blood test.

## 2015-09-25 NOTE — Assessment & Plan Note (Signed)
Prophylactic A/B coverage with clindamycin. F/U in 2 wk for reassessment. Consider biopsy or U/S if no improvement. He is high risk for cancer based on age and a chronic smoker. Patient understand f/u instruction.

## 2015-09-25 NOTE — Assessment & Plan Note (Signed)
Likely related to recent hospitalization. I recommended recheck today with free T3 and T4 but he would rather wait for a while which is reasonable. Recheck in 2 wks with PCP.

## 2015-09-25 NOTE — Assessment & Plan Note (Signed)
Likely multi-factorial. He is a chronic smoker, this can increase WBC. No fever but I worry about his submandibular lymph node swelling. Although he does not have a left shift. Plan to start A/B coverage for lymphadenitis. See PCP in 2 wks for reassessment. Consider rechecking CBC.

## 2015-09-25 NOTE — Progress Notes (Signed)
Subjective:     Patient ID: [name] [name], male   DOB: 1961/01/24, 54 y.o.   MRN: [id number]  HPI  CVA: Patient is here to follow up after hospital discharge for stroke. During his hospitalization, he had work finding difficulty as well as right extremity weakness and numbness. He stated he is having paresthetic pain ( tingling) on his right arm about 10/10 in severity. He has similar pain on his left leg which is chronic for him. He is taking Lyrica and Percocet at home with no improvement. He stated while he was in the hospital he was given Oxy IR in addition to his percocet which helped his pain. He is asking for this. Patient stated he has neurology appointment set up. He is scheduled to start home PT next Monday in 2 days.  HTN/HLD: He was taken off his antihypertensive agents ( HCTZ 25 mg and Lisinopril 40 mg qd). He will like to restart his medication back if possible. He is compliant with Lipitor 80 mg. Lymphocytocysis: Patient is worried about his elevated WBC during hospitalization. He denies fever. LN: C/O right submandibular nodular swelling that he noticed during hospitalization. Swelling is getting bigger and painful. He denies any tooth ache or surgery. No jaw injury or facial skin infection. LowTSH: He had low TSH during hospitalization. Here for follow up. Tobacco: He continues to smoke but had cut back from 2 PPD to 1/2 PPD. He prefers to quit without use of pharmacologic agent.  Current Outpatient Prescriptions on File Prior to Visit  Medication Sig Dispense Refill  . albuterol (PROVENTIL HFA;VENTOLIN HFA) 108 (90 BASE) MCG/ACT inhaler Inhale 2 puffs into the lungs every 4 (four) hours as needed for wheezing.    Marland Kitchen atorvastatin (LIPITOR) 80 MG tablet Take 1 tablet (80 mg total) by mouth daily at 6 PM. 90 tablet 1  . butalbital-acetaminophen-caffeine (FIORICET, ESGIC) 50-325-40 MG per tablet Take 1 tablet by mouth 2 (two) times daily as needed for headache. 60 tablet 3  . finasteride  (PROSCAR) 5 MG tablet Take 1 tablet (5 mg total) by mouth daily. 30 tablet 11  . LORazepam (ATIVAN) 2 MG tablet TAKE 1 TABLET BY MOUTH TWICE DAILY AS NEEDED FOR ANXIETY 60 tablet 3  . oxyCODONE-acetaminophen (PERCOCET) 10-325 MG tablet Take 1 tablet by mouth every 4 (four) hours as needed for pain. Refill after 09/28/2015 180 tablet 0  . pregabalin (LYRICA) 150 MG capsule Take 1 capsule (150 mg total) by mouth 2 (two) times daily. 60 capsule 3  . rivaroxaban (XARELTO) 20 MG TABS tablet TAKE 1 TABLET BY MOUTH EVERY Friddle WITH SUPPER 30 tablet 11  . traZODone (DESYREL) 100 MG tablet Take 1 tablet (100 mg total) by mouth at bedtime. 90 tablet 2  . venlafaxine XR (EFFEXOR-XR) 150 MG 24 hr capsule TAKE 1 CAPSULE BY MOUTH DAILY 30 capsule 5  . cyclobenzaprine (FLEXERIL) 10 MG tablet TAKE ONE TABLET BY MOUTH THREE TIMES DAILY AS NEEDED FOR MUSCLE SPASM (Patient not taking: Reported on 09/25/2015) 90 tablet 3  . hydrocortisone 2.5 % ointment Apply topically 2 (two) times daily. (Patient not taking: Reported on 07/21/2015) 30 g 1  . loratadine (CLARITIN) 10 MG tablet Take 1 tablet (10 mg total) by mouth daily. (Patient not taking: Reported on 09/25/2015) 90 tablet 3  . tiotropium (SPIRIVA) 18 MCG inhalation capsule Place 18 mcg into inhaler and inhale daily.     No current facility-administered medications on file prior to visit.   Past Medical History  Diagnosis  Date  . Constipation due to pain medication   . COPD (chronic obstructive pulmonary disease) ([place])   . Status post spinal surgery   . Hypertension   . Anxiety   . PE (pulmonary embolism)     DVT left leg  . Tobacco abuse   . 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 Migraine      Review of Systems  Constitutional: Negative for fever.  HENT: Negative for dental problem and ear pain.        Submandibular swelling  Eyes: Negative for visual disturbance.  Respiratory: Negative.   Cardiovascular: Negative.     Gastrointestinal: Negative.   Musculoskeletal:       Right arm numbness and paresthesia  Neurological: Positive for weakness. Negative for dizziness, tremors, speech difficulty and headaches.  All other systems reviewed and are negative.  Filed Vitals:   09/25/15 0952 09/25/15 1015 09/25/15 1019  BP: 151/95 172/98 156/94  Pulse: 65 77 94  Temp: 98.1 F (36.7 C)    TempSrc: Oral    Height: 5\' 11"  (1.803 m)    Weight: 238 lb (107.956 kg)          Objective:   Physical Exam  Constitutional: He appears well-developed. No distress.  HENT:  Mouth/Throat:    Eyes: Pupils are equal, round, and reactive to light.  Neck: Neck supple.  Cardiovascular: Normal rate, regular rhythm and normal heart sounds.   No murmur heard. Pulmonary/Chest: Effort normal and breath sounds normal. No respiratory distress. He has no wheezes.  Abdominal: Soft. Bowel sounds are normal. He exhibits no distension and no mass. There is no tenderness.  Musculoskeletal: He exhibits no edema or tenderness.  Ambulating slowly with a walker  Lymphadenopathy:       Head (right side): Submandibular adenopathy present.  Firm, round, mobile, tender submandibular LN measuring about 3 cm by 2 cm in size. No skin erythema.  Neurological: He displays no atrophy. A sensory deficit is present. No cranial nerve deficit.  Right upper limb sensation reduced. Reduced motor strength on the right UL and LL. No tremor. Ambulates slowly with a rolling walker  Nursing note and vitals reviewed.      Assessment:     CVA HTN HLD Lymphocytosis Lymphadenitis LowTSH Tobacco abuse     Plan:     Check problem list.

## 2015-09-25 NOTE — Assessment & Plan Note (Signed)
BP slightly elevated today. Currently off his antihypertensive agents. I restarted him on Lisinopril 20 mg qd instead of 40 mg and will continue to hold HCTZ 25 mg for now. COntinue home BP monitoring. F/U with PCP in 2 wks for reassessment.

## 2015-09-29 ENCOUNTER — Telehealth: Payer: Self-pay | Admitting: *Deleted

## 2015-09-29 NOTE — Telephone Encounter (Signed)
[name] [name], Physical Therapist with [facility] called to request verbal order for speech therapy.  Patient was seen by Dr. Gwendlyn Deutscher on 09/25/15 and referral was placed for speech therapy.  Verbal order given to [facility].  Tia, referral coordinator informed that it has been taken care.  Derl Barrow, RN

## 2015-09-29 NOTE — Telephone Encounter (Signed)
Thanks

## 2015-10-07 ENCOUNTER — Telehealth: Payer: Self-pay | Admitting: Family Medicine

## 2015-10-07 NOTE — Telephone Encounter (Signed)
Will forward to PCP to make him aware. Yaniah Thiemann, CMA.

## 2015-10-07 NOTE — Telephone Encounter (Signed)
Thanks for the update. -Dr. Lamar Benes

## 2015-10-07 NOTE — Telephone Encounter (Signed)
Pt has cancelled his last 2 home health visits because he says he has the sniffles. He states he will call when he feels better to schedule further appts

## 2015-10-08 ENCOUNTER — Other Ambulatory Visit: Payer: Self-pay | Admitting: Family Medicine

## 2015-10-08 ENCOUNTER — Ambulatory Visit: Payer: Medicaid Other | Admitting: Family Medicine

## 2015-10-12 ENCOUNTER — Encounter: Payer: Self-pay | Admitting: Family Medicine

## 2015-10-12 ENCOUNTER — Ambulatory Visit (INDEPENDENT_AMBULATORY_CARE_PROVIDER_SITE_OTHER): Payer: Medicaid Other | Admitting: Family Medicine

## 2015-10-12 VITALS — BP 120/69 | HR 72 | Temp 98.1°F | Ht 71.0 in | Wt 231.5 lb

## 2015-10-12 DIAGNOSIS — G894 Chronic pain syndrome: Secondary | ICD-10-CM

## 2015-10-12 DIAGNOSIS — I69320 Aphasia following cerebral infarction: Secondary | ICD-10-CM

## 2015-10-12 MED ORDER — OXYCODONE HCL 15 MG PO TABS: 15.0000 mg | ORAL_TABLET | Freq: Four times a day (QID) | ORAL | Status: DC | PRN

## 2015-10-12 MED ORDER — LORAZEPAM 1 MG PO TABS: ORAL_TABLET | ORAL | Status: DC

## 2015-10-12 MED ORDER — BUTALBITAL-APAP-CAFFEINE 50-325-40 MG PO TABS: 1.0000 | ORAL_TABLET | Freq: Two times a day (BID) | ORAL | Status: DC | PRN

## 2015-10-12 NOTE — Progress Notes (Signed)
   Subjective:    Patient ID: [name] [name], male    DOB: [birth year], 54 y.o.   MRN: [id number]  HPI  CC: follow up   # Post CVA  Still having issues with speech and word finding. SLP was finally able to visit him at home and he says he is scheduled for 20 visits. He is participating with PT  He is concerned the CVA is causing him to have anger outbursts  Still not sure what exactly caused his fall  # Chronic pain secondary to neck, knee:  discussed negative UDS in ED during hospitalization -- he says that he was taking his prescribed medicines up until the evening before he was admitted. He was also confused that the UDS showed he was positive for barbiturates because he said he hadn't taken that for 4 days prior to admission  He expresses interest in going back and seeing an orthopedic surgeon -- he says in the past he was close to going to surgery when the surgeon canceled it.   # Anxiety/depression  He continues to take his effexor.   Taking ativan mostly at night to help him sleep, he will occasionally take a second tablet if he wakes up at night. Also taking his trazodone.  PHQ9 20/somewhat difficult ROS: no SI or HI  Social Hx: current smoker  Review of Systems   See HPI for ROS.   Past medical history, surgical, family, and social history reviewed and updated in the EMR as appropriate. Objective:  BP 120/69 mmHg  Pulse 72  Temp(Src) 98.1 F (36.7 C) (Oral)  Ht 5\' 11"  (1.803 m)  Wt 231 lb 8 oz (105.008 kg)  BMI 32.30 kg/m2 Vitals and nursing note reviewed  General: NAD Neuro: alert and oriented. Speech is fluent with some word finding difficulties noted. Able to ambulate with walker. Grip strength is 4+/5 on the right, 5/5 on left. Psych: mood "angry", normal thought content and speech.  Assessment & Plan:  Chronic pain syndrome Worsened acutely in setting of CVA. He stated that he was getting oxycodone IR 15mg  in addition to the percocet during the hospital  course and that this was close to taking away the majority of his pain. He wanted to decrease his percocet from 6 times a [place] to 4 times a [place] and add on the oxyIR. I initially prescribed a short course of the oxycodone IR 15mg  #30 that I discussed with him would not be a long term addition to his regimen, but reviewed his hospital admission via [facility] and see that he in fact was getting 5mg  oxyIR during hospital course on top of his percocet. I called him and he said he was already picking this up at the pharmacy. I told him that he needs to cut these tablets in half and continue to take the warning of only using them when he has severe breakthrough pain. Overall increase in oxycodone per Jagi[place] would be 30mg . Follow up in 2 weeks.  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 He was continued on xarelto only (history of saddle PE, so was on this but apparently missed some doses prior to fall) and no aspirin. He has not yet follow up with neuro as outpatient.

## 2015-10-12 NOTE — Assessment & Plan Note (Signed)
Now set up with home SLP, continuing home PT. He was continued on xarelto only (history of saddle PE, so was on this but apparently missed some doses prior to fall) and no aspirin. He has not yet follow up with neuro as outpatient.

## 2015-10-12 NOTE — Assessment & Plan Note (Signed)
Worsened acutely in setting of CVA. He stated that he was getting oxycodone IR 15mg  in addition to the percocet during the hospital course and that this was close to taking away the majority of his pain. He wanted to decrease his percocet from 6 times a [place] to 4 times a [place] and add on the oxyIR. I initially prescribed a short course of the oxycodone IR 15mg  #30 that I discussed with him would not be a long term addition to his regimen, but reviewed his hospital admission via [facility] and see that he in fact was getting 5mg  oxyIR during hospital course on top of his percocet. I called him and he said he was already picking this up at the pharmacy. I told him that he needs to cut these tablets in half and continue to take the warning of only using them when he has severe breakthrough pain. Overall increase in oxycodone per [place] would be 30mg . Follow up in 2 weeks.

## 2015-10-12 NOTE — Patient Instructions (Addendum)
Our goal is to decrease your ativan until we can take it off. We will start by cutting the dose from 2mg  to 1mg .  Oxycodone immediate release is only for the absolute worse pain when you are trying to get through physical therapy.  Continue with PT and speech therapy  Come back in 2 weeks

## 2015-10-19 ENCOUNTER — Telehealth: Payer: Self-pay | Admitting: Family Medicine

## 2015-10-19 DIAGNOSIS — E039 Hypothyroidism, unspecified: Secondary | ICD-10-CM

## 2015-10-19 DIAGNOSIS — G894 Chronic pain syndrome: Secondary | ICD-10-CM

## 2015-10-19 DIAGNOSIS — R7989 Other specified abnormal findings of blood chemistry: Secondary | ICD-10-CM

## 2015-10-19 NOTE — Telephone Encounter (Signed)
Order for UDS and TSH put in as future orders.

## 2015-10-19 NOTE — Telephone Encounter (Signed)
Pt called and wanted to make sure that the doctor put in his lab orders. He has an appointment on 10/26/15 and would like to come I before his appointment so that he can get this done before his appointment. jw

## 2015-10-20 NOTE — Telephone Encounter (Signed)
LM for patient that lab order has been placed and for him to call back and schedule an appt. Jazmin [name],CMA

## 2015-10-26 ENCOUNTER — Ambulatory Visit (INDEPENDENT_AMBULATORY_CARE_PROVIDER_SITE_OTHER): Payer: Medicaid Other | Admitting: Family Medicine

## 2015-10-26 ENCOUNTER — Other Ambulatory Visit: Payer: Medicaid Other

## 2015-10-26 ENCOUNTER — Encounter: Payer: Self-pay | Admitting: Family Medicine

## 2015-10-26 VITALS — BP 148/71 | HR 86 | Temp 97.7°F | Wt 240.2 lb

## 2015-10-26 DIAGNOSIS — I10 Essential (primary) hypertension: Secondary | ICD-10-CM

## 2015-10-26 DIAGNOSIS — G894 Chronic pain syndrome: Secondary | ICD-10-CM | POA: Diagnosis not present

## 2015-10-26 DIAGNOSIS — F411 Generalized anxiety disorder: Secondary | ICD-10-CM | POA: Diagnosis not present

## 2015-10-26 DIAGNOSIS — R946 Abnormal results of thyroid function studies: Secondary | ICD-10-CM | POA: Diagnosis not present

## 2015-10-26 DIAGNOSIS — I63419 Cerebral infarction due to embolism of unspecified middle cerebral artery: Secondary | ICD-10-CM | POA: Diagnosis not present

## 2015-10-26 DIAGNOSIS — R7989 Other specified abnormal findings of blood chemistry: Secondary | ICD-10-CM

## 2015-10-26 MED ORDER — HYDROCHLOROTHIAZIDE 25 MG PO TABS: 25.0000 mg | ORAL_TABLET | Freq: Every day | ORAL | Status: DC

## 2015-10-26 MED ORDER — LISINOPRIL 40 MG PO TABS: 40.0000 mg | ORAL_TABLET | Freq: Every day | ORAL | Status: DC

## 2015-10-26 MED ORDER — OXYCODONE-ACETAMINOPHEN 10-325 MG PO TABS: 1.0000 | ORAL_TABLET | ORAL | Status: DC | PRN

## 2015-10-26 MED ORDER — AMLODIPINE BESYLATE 5 MG PO TABS: 5.0000 mg | ORAL_TABLET | Freq: Every day | ORAL | Status: DC

## 2015-10-26 NOTE — Progress Notes (Signed)
   Subjective:    Patient ID: [name] [name], male    DOB: Sep 21, 1961, 54 y.o.   MRN: [id number]  HPI  CC: follow up   # CVA with aphasia:  done with physical therapy  Has neurology appointment on the 1/10.   Speech is doing better, still seeing SLP. Still having some issues with speech blocking. ROS: no new weakness, no headaches  # Chronic pain  Current pain level is 8/10. Thinks left leg swelling will go down over the next few days and most likely 9/10.   Says he used half of tab of 15mg  during PT, was doing better. He finished PT and says he has flushed the rest of the oxycodone IR tablets   Left leg pain Fell and landed on left butt cheek on a chair leg, also hit head and lost consciousness. Happened about 2 years ago. NCS was done on the leg and they "couldn't get any" response from the nerve. He has muscular atrophy of his calf muscles, but oddly he gets some swelling in the calf that actually makes his calf appearance normal and similar to his right leg. When the swelling occurs he says his pain goes away.  Anxiety  Taking usually one tab ativan at night. Occasionally takes a few when he gets frustrated from speech issues  Overall feels he is doing okay with trying to decrease ativan use  Social Hx: current 0.5 ppd  Review of Systems   See HPI for ROS.   Past medical history, surgical, family, and social history reviewed and updated in the EMR as appropriate. Objective:  BP 148/71 mmHg  Pulse 86  Temp(Src) 97.7 F (36.5 C) (Oral)  Wt 240 lb 3.2 oz (108.954 kg) Vitals and nursing note reviewed  General: NAD CV: RRR, normal heart sounds no murmurs, rubs or gallop  Resp: clear to auscultation bilaterally normal effort Extremities: left calf normal appearance compared to right. There are no palpable cords or tenderness with squeeze. Psych: mood appears much better than prior visits, normal affect, normal thought content and speech  Assessment & Plan:  CVA  (cerebral infarction) Improving aphasia with occasional word blocking. I did not notice any word blocking at his visit. He will continue SLP. He is anticoagulated with xarelto.  Chronic pain syndrome Says he destroyed extra IR oxycodone prescribed at last visit after he finished with PT. UDS collected at visit was negative for prescribed medicine. Will discuss this at next visit.  Low TSH level Re-check was normal post-hospitalization. No intervention needed.  Anxiety state Continuing to titrate down ativan. No exacerbation. Follow up 1 month.  HTN (hypertension) Will discontinue hctz and try amlodipne, he was having complaints of too frequent urination.

## 2015-10-27 ENCOUNTER — Ambulatory Visit: Payer: Medicaid Other | Admitting: Family Medicine

## 2015-10-27 LAB — DRUG SCR UR, PAIN MGMT, REFLEX CONF
AMPHETAMINE SCRN UR: NEGATIVE
BARBITURATE QUANT UR: NEGATIVE
Benzodiazepines.: NEGATIVE
Cocaine Metabolites: NEGATIVE
Creatinine,U: 25.73 mg/dL
MARIJUANA METABOLITE: NEGATIVE
METHADONE: NEGATIVE
OPIATES: NEGATIVE
PHENCYCLIDINE (PCP): NEGATIVE
PROPOXYPHENE: NEGATIVE

## 2015-10-27 LAB — TSH: TSH: 0.648 u[IU]/mL (ref 0.350–4.500)

## 2015-10-29 NOTE — Assessment & Plan Note (Signed)
Says he destroyed extra IR oxycodone prescribed at last visit after he finished with PT. UDS collected at visit was negative for prescribed medicine. Will discuss this at next visit.

## 2015-10-29 NOTE — Assessment & Plan Note (Signed)
Will discontinue hctz and try amlodipne, he was having complaints of too frequent urination.

## 2015-10-29 NOTE — Assessment & Plan Note (Signed)
Re-check was normal post-hospitalization. No intervention needed.

## 2015-10-29 NOTE — Assessment & Plan Note (Signed)
Continuing to titrate down ativan. No exacerbation. Follow up 1 month.

## 2015-10-29 NOTE — Assessment & Plan Note (Signed)
Improving aphasia with occasional word blocking. I did not notice any word blocking at his visit. He will continue SLP. He is anticoagulated with xarelto.

## 2015-11-17 ENCOUNTER — Ambulatory Visit: Payer: Medicaid Other | Admitting: Neurology

## 2015-11-18 ENCOUNTER — Telehealth: Payer: Self-pay

## 2015-11-18 NOTE — Telephone Encounter (Signed)
I called the patient and left a voicemail asking him to call me back to r/s appointment from 11/17/15.

## 2015-11-19 NOTE — Telephone Encounter (Signed)
I called the patient and asked him to call back to r/s appointment from 11/17/15. I have also mailed a letter to the patient requesting he call to r/s. I called him twice from home to r/s during the snow/ice as well.

## 2015-11-30 ENCOUNTER — Ambulatory Visit (INDEPENDENT_AMBULATORY_CARE_PROVIDER_SITE_OTHER): Payer: Medicaid Other | Admitting: Family Medicine

## 2015-11-30 ENCOUNTER — Encounter: Payer: Self-pay | Admitting: Family Medicine

## 2015-11-30 VITALS — BP 124/86 | HR 63 | Temp 98.1°F | Ht 71.0 in | Wt 232.0 lb

## 2015-11-30 DIAGNOSIS — Z1211 Encounter for screening for malignant neoplasm of colon: Secondary | ICD-10-CM | POA: Diagnosis not present

## 2015-11-30 DIAGNOSIS — G5702 Lesion of sciatic nerve, left lower limb: Secondary | ICD-10-CM

## 2015-11-30 DIAGNOSIS — F411 Generalized anxiety disorder: Secondary | ICD-10-CM | POA: Diagnosis not present

## 2015-11-30 DIAGNOSIS — G894 Chronic pain syndrome: Secondary | ICD-10-CM | POA: Diagnosis not present

## 2015-11-30 MED ORDER — OXYCODONE-ACETAMINOPHEN 10-325 MG PO TABS: 1.0000 | ORAL_TABLET | ORAL | Status: DC | PRN

## 2015-11-30 NOTE — Progress Notes (Signed)
   Subjective:    Patient ID: [name] [name], male    DOB: 1961/03/25, 55 y.o.   MRN: [id number]  HPI  CC: medication refill  # Chronic pain:  Worsened after a train ride recently  Says pain in his left leg is actually the most severe pain he has, says that if he could get rid of the left leg/foot pain he could probably tolerate going off of the pain medicines (says he could deal with his back and neck pain)  Left leg pain does get better at times --- he notices this mostly when the leg "swells" which he can't predict when it will happen. Most of the time the leg is "floppy", meaning the calf muscle is floppy/feels wasted away. He has previously had a NCS of his left leg that showed "no activity" -- seen by Dr. Jannifer Franklin in the past for this  Discussed his negative UDS. He wasn't sure if it was negative because his urine sample was very small; he had thought the urine sample wasn't going to get collected because his lab visit was canceled (we had made both a lab visit and doctor visit in the same [place]) so he went to the bathroom right before he was brought back for his doctor visit/urine collection. He says that he didn't skip any doses of the percocet and had taken them that [place].   He is thinking he will again try and contact [facility] to see if they would take him back.  # Anxiety  Last took his ativan 10 days ago. Says he feels he is doing okay while off of it. He still has about 30 tablets left, and will try to only use 1/2 tab when absolutely needed.   # Stroke  Missed his neurology appointment because of the weather.   He feels he is doing better  Social Hx: current smoker  Review of Systems   See HPI for ROS.   Past medical history, surgical, family, and social history reviewed and updated in the EMR as appropriate. Objective:  BP 124/86 mmHg  Pulse 63  Temp(Src) 98.1 F (36.7 C) (Oral)  Ht 5\' 11"  (1.803 m)  Wt 232 lb (105.235 kg)  BMI 32.37 kg/m2  SpO2 98% Vitals  and nursing note reviewed  General: no apparent distress  CV: normal rate, regular rhythm, no murmurs, rubs or gallop.  Resp: clear to auscultation bilaterally, normal effort  Assessment & Plan:  Neuropathy of left sciatic nerve Somewhat stable on his current medication regimen. Again discussed titrating medicine down but he is very resistant to this. Oddly he gets the most relief when his leg swells? Not clear what is causing this (noted he is on xarelto). I have asked him to follow up with his neurologist to see if there are any other options.   Anxiety state Doing well while staying off ativan. Given encouragement for this and continue to wean ativan.  Chronic pain syndrome Discussed negative UDS. Still wonder if there is a possibility of him being a fast metabolizer? Discussed whether we could do a trial of observed therapy and then collect UDS later that [place].

## 2015-12-01 NOTE — Assessment & Plan Note (Addendum)
Somewhat stable on his current medication regimen. Again discussed titrating medicine down but he is very resistant to this. Oddly he gets the most relief when his leg swells? Not clear what is causing this (noted he is on xarelto). I have asked him to follow up with his neurologist to see if there are any other options.

## 2015-12-01 NOTE — Assessment & Plan Note (Addendum)
Doing well while staying off ativan. Given encouragement for this and continue to wean ativan.

## 2015-12-01 NOTE — Assessment & Plan Note (Signed)
Discussed negative UDS. Still wonder if there is a possibility of him being a fast metabolizer? Discussed whether we could do a trial of observed therapy and then collect UDS later that [place].

## 2015-12-10 ENCOUNTER — Other Ambulatory Visit: Payer: Self-pay | Admitting: Family Medicine

## 2015-12-10 ENCOUNTER — Telehealth: Payer: Self-pay

## 2015-12-10 ENCOUNTER — Ambulatory Visit: Payer: Medicaid Other | Admitting: Neurology

## 2015-12-10 NOTE — Telephone Encounter (Signed)
Patient canceled appointment less than 24 hours prior to appointment.

## 2015-12-11 NOTE — Telephone Encounter (Signed)
Phoned in rx. 

## 2015-12-16 ENCOUNTER — Encounter: Payer: Self-pay | Admitting: Neurology

## 2015-12-31 ENCOUNTER — Encounter: Payer: Self-pay | Admitting: Family Medicine

## 2015-12-31 ENCOUNTER — Ambulatory Visit (INDEPENDENT_AMBULATORY_CARE_PROVIDER_SITE_OTHER): Payer: Medicaid Other | Admitting: Family Medicine

## 2015-12-31 VITALS — BP 137/93 | HR 96 | Temp 98.1°F | Ht 71.0 in | Wt 222.0 lb

## 2015-12-31 DIAGNOSIS — G5702 Lesion of sciatic nerve, left lower limb: Secondary | ICD-10-CM | POA: Diagnosis present

## 2015-12-31 DIAGNOSIS — G894 Chronic pain syndrome: Secondary | ICD-10-CM

## 2015-12-31 MED ORDER — OXYCODONE-ACETAMINOPHEN 7.5-325 MG PO TABS: 1.0000 | ORAL_TABLET | ORAL | Status: DC | PRN

## 2015-12-31 MED ORDER — PREGABALIN 200 MG PO CAPS: 200.0000 mg | ORAL_CAPSULE | Freq: Two times a day (BID) | ORAL | Status: DC

## 2015-12-31 NOTE — Patient Instructions (Signed)
For when you have time and are ready:  Schedule an appointment with the NURSE clinic in the morning. Bring your pills in the original bottle. You will take the medicine then.  For the SAME Cui, you are going to schedule a LAB visit about 4-5 hours after (so likely in the afternoon) to collect the urine.   Increased the lyrica tablets to 200mg  twice a [place]

## 2015-12-31 NOTE — Progress Notes (Signed)
   Subjective:    Patient ID: [name] [name], male    DOB: 1961-02-20, 55 y.o.   MRN: [id number]  HPI  CC: follow up   # Left leg pain:  Still present, right now stable.  He expresses interest that he wants to decrease his oxycodone dose from 10 to 7.5mg . He wants to increase the lyrica dose because he does feel that most of his current pain is from sciatica/nerve pain in his left leg  He also wants to try and do the observed oxycodone therapy and repeat the drug testing later that Rom.  ROS: +numbness and weakness on left leg, no CP, no SOB  He does come in distressed, says that his girlfriend's 38yo old son died this past week unexpectedly; he apparently passed away from sepsis and had a cardiac arrest but he is not sure of the details. His girlfriend is very heart broken about this and they both have been having a difficult time with the situation.  Social Hx: current smoker  Review of Systems   See HPI for ROS.   Past medical history, surgical, family, and social history reviewed and updated in the EMR as appropriate. Objective:  BP 137/93 mmHg  Pulse 96  Temp(Src) 98.1 F (36.7 C) (Oral)  Ht 5\' 11"  (1.803 m)  Wt 222 lb (100.699 kg)  BMI 30.98 kg/m2 Vitals and nursing note reviewed  General: no apparent distress  Neuro: alert and oriented, able to walk with rolling walker  Assessment & Plan:  Neuropathy of left sciatic nerve Increase lyrica to 200mg  BID (appears max dose would be around 600mg  in his case with known nerve pathology).  He will schedule a nurse and lab visit to have a nurse observe taking his oxycodone, with a subsequent lab draw for urine sample later that [place].

## 2015-12-31 NOTE — Assessment & Plan Note (Signed)
Increase lyrica to 200mg  BID (appears max dose would be around 600mg  in his case with known nerve pathology).  He will schedule a nurse and lab visit to have a nurse observe taking his oxycodone, with a subsequent lab draw for urine sample later that [place].

## 2016-01-01 ENCOUNTER — Telehealth: Payer: Self-pay | Admitting: *Deleted

## 2016-01-01 NOTE — Telephone Encounter (Signed)
Prior Authorization received from [facility] for Lyrica. PA approved until 12/31/2016. PA approval number: [id number].  Derl Barrow, RN

## 2016-01-04 ENCOUNTER — Other Ambulatory Visit: Payer: Self-pay | Admitting: Internal Medicine

## 2016-01-04 NOTE — Telephone Encounter (Signed)
Pharmacist from [facility] called having problems with Rx Lyrica 200 mg being approved from Medicaid.  PA for Lyrica had to be repeated.  Approved via [employer] [employer] unitl 12/29/2016.  PA approval number: [id number].  Patient and Pharmacy aware and patient can pick up medication today.  Derl Barrow, RN

## 2016-01-12 ENCOUNTER — Other Ambulatory Visit: Payer: Self-pay | Admitting: Family Medicine

## 2016-01-15 NOTE — Telephone Encounter (Signed)
2nd request.  Kadeem Hyle L, RN  

## 2016-01-19 ENCOUNTER — Other Ambulatory Visit: Payer: Self-pay | Admitting: Family Medicine

## 2016-01-21 ENCOUNTER — Encounter: Payer: Self-pay | Admitting: Family Medicine

## 2016-01-21 ENCOUNTER — Other Ambulatory Visit: Payer: Medicaid Other

## 2016-01-21 ENCOUNTER — Ambulatory Visit (INDEPENDENT_AMBULATORY_CARE_PROVIDER_SITE_OTHER): Payer: Medicaid Other | Admitting: Family Medicine

## 2016-01-21 VITALS — BP 155/89 | HR 61 | Temp 98.0°F | Wt 230.4 lb

## 2016-01-21 DIAGNOSIS — R51 Headache: Secondary | ICD-10-CM | POA: Diagnosis not present

## 2016-01-21 DIAGNOSIS — R519 Headache, unspecified: Secondary | ICD-10-CM

## 2016-01-21 DIAGNOSIS — I10 Essential (primary) hypertension: Secondary | ICD-10-CM

## 2016-01-21 DIAGNOSIS — G894 Chronic pain syndrome: Secondary | ICD-10-CM

## 2016-01-21 MED ORDER — AMLODIPINE BESYLATE 10 MG PO TABS: 10.0000 mg | ORAL_TABLET | Freq: Every day | ORAL | Status: DC

## 2016-01-21 NOTE — Patient Instructions (Signed)
Blood pressure:  Increased amlodipine to 10mg , sent in new prescription for 10mg  tablets, take once a Sparrow. You can take 2 of the 5mg  tablets until you run out.  Reschedule the nurse and lab visit as we previously talked about.

## 2016-01-25 DIAGNOSIS — R51 Headache: Secondary | ICD-10-CM

## 2016-01-25 DIAGNOSIS — R519 Headache, unspecified: Secondary | ICD-10-CM | POA: Insufficient documentation

## 2016-01-25 MED ORDER — OXYCODONE-ACETAMINOPHEN 7.5-325 MG PO TABS: 1.0000 | ORAL_TABLET | ORAL | Status: DC | PRN

## 2016-01-25 NOTE — Assessment & Plan Note (Signed)
Not at goal in clinic today. Increase amlodipine to 10mg . Follow up 1 month.

## 2016-01-25 NOTE — Assessment & Plan Note (Signed)
Has been on fioricet since around 2013. Headaches intermittent, not worsening. Refill fiorcet. Follow up as needed.

## 2016-01-25 NOTE — Assessment & Plan Note (Signed)
Patient was unable to do observed therapy with UDS collection today. Will re-schedule this for next week most likely. Due for refill next week, will print this and leave at front desk to be picked up.

## 2016-01-25 NOTE — Progress Notes (Signed)
   Subjective:    Patient ID: [name] [name], male    DOB: 09-15-1961, 55 y.o.   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 No acute worsening  No new weakness, numbness around body  # Hypertension  Taking medicines as prescribed, took today  No CP, no current HA, no changes in vision  Social Hx: current smoker  Review of Systems   See HPI for ROS.   Past medical history, surgical, family, and social history reviewed and updated in the EMR as appropriate. Objective:  BP 155/89 mmHg  Pulse 61  Temp(Src) 98 F (36.7 C) (Oral)  Wt 230 lb 6.4 oz (104.509 kg) Vitals and nursing note reviewed  General: no apparent distress  CV: normal rate, regular rhythm, no murmurs, rubs or gallop  Resp: clear to auscultation bilaterally, normal effort  Assessment & Plan:  Chronic pain syndrome Patient was unable to do observed therapy with UDS collection today. Will re-schedule this for next week most likely. Due for refill next week, will print this and leave at front desk to be picked up.   Headache Has been on fioricet since around 2013. Headaches intermittent, not worsening. Refill fiorcet. Follow up as needed.   HTN (hypertension) Not at goal in clinic today. Increase amlodipine to 10mg . Follow up 1 month.

## 2016-01-28 ENCOUNTER — Other Ambulatory Visit: Payer: Medicaid Other

## 2016-02-22 ENCOUNTER — Other Ambulatory Visit: Payer: Self-pay | Admitting: Family Medicine

## 2016-02-22 NOTE — Telephone Encounter (Signed)
Patient will be out of medication on 4/21.  Need refill on his oxycodone. Nothing available this week with provider

## 2016-02-23 ENCOUNTER — Other Ambulatory Visit: Payer: Medicaid Other

## 2016-02-23 ENCOUNTER — Ambulatory Visit (INDEPENDENT_AMBULATORY_CARE_PROVIDER_SITE_OTHER): Payer: Medicaid Other | Admitting: *Deleted

## 2016-02-23 DIAGNOSIS — G894 Chronic pain syndrome: Secondary | ICD-10-CM

## 2016-02-23 MED ORDER — OXYCODONE-ACETAMINOPHEN 7.5-325 MG PO TABS: 1.0000 | ORAL_TABLET | ORAL | Status: DC | PRN

## 2016-02-23 NOTE — Telephone Encounter (Signed)
Patient came in for nurse visit today and inquired about refill request for his oxycodone.  Informed patient that this request hasn't been filled yet. Jakelyn Squyres,CMA

## 2016-02-23 NOTE — Telephone Encounter (Signed)
Rx printed and left up front, patient is supposed to return this afternoon for UDS.

## 2016-02-23 NOTE — Progress Notes (Signed)
    Patient presents for observation of oxycodone intake Patient brought oxycodone 7.5-325 in labeled pharmacy bottle This RN observed patient taking 1 tab of oxycodone 7.5-325 Patient will return at 4 PM today for urine drug screen. Velora Heckler, RN

## 2016-02-24 ENCOUNTER — Other Ambulatory Visit: Payer: Self-pay | Admitting: Family Medicine

## 2016-02-25 LAB — DRUG SCR UR, PAIN MGMT, REFLEX CONF
AMPHETAMINE SCRN UR: NEGATIVE
BENZODIAZEPINES.: NEGATIVE
COCAINE METABOLITES: NEGATIVE
Creatinine,U: 92.54 mg/dL
MARIJUANA METABOLITE: NEGATIVE
Methadone: NEGATIVE
Phencyclidine (PCP): NEGATIVE
Propoxyphene: NEGATIVE

## 2016-02-27 LAB — OPIATES/OPIOIDS (LC/MS-MS)
CODEINE URINE: NEGATIVE ng/mL (ref <50)
HYDROCODONE: NEGATIVE ng/mL (ref <50)
Hydromorphone: NEGATIVE ng/mL (ref <50)
MORPHINE: NEGATIVE ng/mL (ref <50)
Norhydrocodone, Ur: NEGATIVE ng/mL (ref <50)
Noroxycodone, Ur: 6903 ng/mL — ABNORMAL HIGH (ref <50)
OXYMORPHONE, URINE: 2274 ng/mL — AB (ref <50)
Oxycodone, ur: 847 ng/mL — ABNORMAL HIGH (ref <50)

## 2016-02-27 LAB — BARBITURATES (GC/LC/MS), URINE
Amobarbital: NEGATIVE ng/mL (ref <100)
BUTALBITAL GC/MS, URINE: 668 ng/mL — AB (ref <100)
PHENOBARBITAL GC/MS, URINE: NEGATIVE ng/mL (ref <100)
Pentabarbital: NEGATIVE ng/mL (ref <100)
Secobarbital: NEGATIVE ng/mL (ref <100)

## 2016-03-04 ENCOUNTER — Other Ambulatory Visit: Payer: Self-pay | Admitting: Family Medicine

## 2016-03-04 ENCOUNTER — Other Ambulatory Visit: Payer: Self-pay | Admitting: Internal Medicine

## 2016-03-07 ENCOUNTER — Other Ambulatory Visit: Payer: Self-pay | Admitting: *Deleted

## 2016-03-07 NOTE — Telephone Encounter (Signed)
This is Dr. Marissa Calamity patient. He will need to refill the prescription if appropriate. Thank you!

## 2016-03-07 NOTE — Telephone Encounter (Signed)
Looks like last filled 10/2015, please advise.

## 2016-03-08 ENCOUNTER — Encounter: Payer: Self-pay | Admitting: Family Medicine

## 2016-03-08 NOTE — Telephone Encounter (Signed)
2nd request.  Martin, Tamika L, RN  

## 2016-03-09 ENCOUNTER — Other Ambulatory Visit: Payer: Self-pay | Admitting: *Deleted

## 2016-03-09 MED ORDER — FINASTERIDE 5 MG PO TABS: 5.0000 mg | ORAL_TABLET | Freq: Every day | ORAL | Status: DC

## 2016-03-09 MED ORDER — RIVAROXABAN 20 MG PO TABS: ORAL_TABLET | ORAL | Status: DC

## 2016-03-09 MED ORDER — BUTALBITAL-APAP-CAFFEINE 50-325-40 MG PO TABS: 1.0000 | ORAL_TABLET | Freq: Two times a day (BID) | ORAL | Status: DC | PRN

## 2016-03-09 MED ORDER — PREGABALIN 200 MG PO CAPS: 200.0000 mg | ORAL_CAPSULE | Freq: Two times a day (BID) | ORAL | Status: DC

## 2016-03-09 MED ORDER — LISINOPRIL 40 MG PO TABS: 40.0000 mg | ORAL_TABLET | Freq: Every day | ORAL | Status: DC

## 2016-03-09 MED ORDER — LORAZEPAM 1 MG PO TABS: ORAL_TABLET | ORAL | Status: DC

## 2016-03-09 NOTE — Telephone Encounter (Signed)
Will forward to MD. [name] [name],CMA  

## 2016-03-09 NOTE — Telephone Encounter (Signed)
Phoned in lyrica refill 1, fioricet #60 refill 1 , ativan #30 refill 0. Rest were electronically sent.

## 2016-03-21 ENCOUNTER — Ambulatory Visit (INDEPENDENT_AMBULATORY_CARE_PROVIDER_SITE_OTHER): Payer: Medicaid Other | Admitting: Family Medicine

## 2016-03-21 VITALS — BP 164/98 | HR 64 | Temp 98.5°F | Wt 244.0 lb

## 2016-03-21 DIAGNOSIS — G8929 Other chronic pain: Secondary | ICD-10-CM | POA: Diagnosis not present

## 2016-03-21 DIAGNOSIS — R202 Paresthesia of skin: Secondary | ICD-10-CM | POA: Diagnosis present

## 2016-03-21 DIAGNOSIS — M6289 Other specified disorders of muscle: Secondary | ICD-10-CM

## 2016-03-21 DIAGNOSIS — R519 Headache, unspecified: Secondary | ICD-10-CM

## 2016-03-21 DIAGNOSIS — R29898 Other symptoms and signs involving the musculoskeletal system: Secondary | ICD-10-CM

## 2016-03-21 DIAGNOSIS — G894 Chronic pain syndrome: Secondary | ICD-10-CM | POA: Diagnosis not present

## 2016-03-21 DIAGNOSIS — R51 Headache: Secondary | ICD-10-CM

## 2016-03-21 MED ORDER — PROPRANOLOL HCL 20 MG PO TABS: 20.0000 mg | ORAL_TABLET | Freq: Three times a day (TID) | ORAL | Status: DC

## 2016-03-21 MED ORDER — PREGABALIN 300 MG PO CAPS: 300.0000 mg | ORAL_CAPSULE | Freq: Two times a day (BID) | ORAL | Status: DC

## 2016-03-21 MED ORDER — OXYCODONE-ACETAMINOPHEN 10-325 MG PO TABS: 1.0000 | ORAL_TABLET | ORAL | Status: DC | PRN

## 2016-03-21 NOTE — Patient Instructions (Signed)
Numbness/weakness of hands:  We will order the MRI to see if anything has changed from your past surgeries and issues.   Headaches:  Start taking the propranol 20mg  three times daily, this is a preventative medicine so try to take it every [place]. It may take some time before it will work. If we need to we can increase the dose.

## 2016-03-21 NOTE — Progress Notes (Signed)
Subjective:    Patient ID: [name] [name], male    DOB: 22-Apr-1961, 55 y.o.   MRN: [id number]  HPI  CC: follow up, but new arm/hand issue  # Numbness in both hands/arms:  pinpoint pain in neck started about 2 weeks ago  Both arms feel like they are in a "refrigerator", when he grips with both hands feels like they are breaking.   Noticing numbness in both hands, he actually has a burn on his left index finger that he noticed today (girlfriend noticed first); he is not sure when this happened btu thinks it might have been from a cigarette but denies feeling it at all  Has a history of anterior c-spine fusion at C5-6 and C6-7 ROS: no fevers, no new weakness in his legs, no lightheadedness, has chronic headaches but not changed.  # Chronic intractable pain  Feels needs to switch back to 10mg , gave it about 3 months at 7.5mg   Says on 10mg  he is much more functional, is able to do chores around the house, easier to get outside of the house  Wants to try and recreate UDS test from December -- at that visit he thought that the UDS test was canceled so he went to the bathroom. To try and make himself urinate he drank a lot of water. As a result of this the urine he gave was actually more dilute (lower creatinine). He would like to come in with observed therapy with nurse, then void prior to collecting the UDS, drink water and then collect the sample for the test to see if the dilution affects the results.  # Headaches  Wants to switch from fioricet, afraid of the tylenol component since he is taking that with his percocet  Still getting daily headaches  Social Hx: continues to smoke 1ppd, switched to "lower strength" cigarette  Review of Systems   See HPI for ROS.   Past medical history, surgical, family, and social history reviewed and updated in the EMR as appropriate. Objective:  BP 187/105 mmHg  Pulse 64  Temp(Src) 98.5 F (36.9 C) (Oral)  Wt 244 lb (110.678 kg) Vitals and  nursing note reviewed  General: no apparent distress  CV: normal rate, regular rhythm, no murmurs, rubs or gallop Resp: clear to auscultation bilaterally normal effort Neck: there is some mild tenderness of c-spine however no pinpoint tenderness and no apparent deformity Neuro: alert and oriented. He has overall normal strength testing in his upper extremities, including finger abduction/adduction. He has some notable decrease in pinprick sensation but does not specifically follow any dermatomes: on the right it is the ulnar side of his palm and back of hand, radial aspect of his wrist. On the left it is most of his palm, ulnar aspect of wrist (though only minimal).  Assessment & Plan:  1. Paresthesia of both hands, Hand weakness He has a history of significant pathology in his neck and also history of fusion. Given his new onset of symptoms felt c-spine imaging was warranted. Reviewed prior MRI c-spine imaging (reads are below). Discussed return if symptoms worsen, develops other stroke like symptoms. - MR Cervical Spine Wo Contrast; Future   2013 1. A broad-based disc osteophyte complex at C3-4 results in mild right greater than left foraminal narrowing and mild central canal stenosis. 2. Mild left-sided facet hypertrophy and uncovertebral spurring at C2-3 without focal stenosis. 3. Status post anterior fusion at C5-6 and C6-7 without residual or recurrent stenosis.  2011 1. A broad-based disc  osteophyte complex at C3-4 results in mild right greater than left foraminal narrowing and mild central canal stenosis. 2. Mild left-sided facet hypertrophy and uncovertebral spurring at C2-3 without focal stenosis. 3. Status post anterior fusion at C5-6 and C6-7 without residual or recurrent stenosis.  Chronic pain syndrome Last UDS expectedly positive for oxycodone and its metabolites. Patient is interested in trying to recreate prior situation from December, which I feel is  reasonable since his urine was in fact more dilute (as evidence by creatinine level), so he will come in for observed therapy again and then try almost like a "washout" with fluids that [place]; he will urinate and drink a fair amount of water prior to collecting this UDS. Also discussed with him that I would try and speak with [facility] to see what genetic testing they have to evaluate his ability to metabolize opioids (I am not sure if the testing they offer is solely for determining his response, or his rate of metabolism). He has additionally tried for 3 months at 7.5mg  oxycodone dose however he has reported significant worsening of his overall function and wishes to go back to 10mg , which was given today. Follow up 1 month.

## 2016-03-24 ENCOUNTER — Other Ambulatory Visit: Payer: Medicaid Other

## 2016-03-24 ENCOUNTER — Ambulatory Visit (INDEPENDENT_AMBULATORY_CARE_PROVIDER_SITE_OTHER): Payer: Medicaid Other | Admitting: *Deleted

## 2016-03-24 DIAGNOSIS — G8929 Other chronic pain: Secondary | ICD-10-CM

## 2016-03-24 DIAGNOSIS — Z79899 Other long term (current) drug therapy: Secondary | ICD-10-CM

## 2016-03-24 NOTE — Progress Notes (Signed)
   Patient ID: [name] [name], male   DOB: [birth year], 55 y.o.   MRN: [id number]   Patient in nurse clinic for nurse to watch him take his pain medication.  Nurse review medication bottle for oxyCODONE-acetaminophen (PERCOCET) 10-325 MG tablet: Take 1 tablet by mouth every 4 (four) hours as needed for pain.  Patient took one oxycodone tablet.  Patient advised to follow up with PCP.  Derl Barrow, RN

## 2016-03-25 LAB — DRUG SCR UR, PAIN MGMT, REFLEX CONF
Amphetamine Screen, Ur: NEGATIVE
Barbiturate Quant, Ur: NEGATIVE
Benzodiazepines.: NEGATIVE
CREATININE, U: 6.1 mg/dL
Cocaine Metabolites: NEGATIVE
METHADONE: NEGATIVE
Marijuana Metabolite: NEGATIVE
OPIATES: NEGATIVE
PHENCYCLIDINE (PCP): NEGATIVE
PROPOXYPHENE: NEGATIVE

## 2016-03-27 ENCOUNTER — Inpatient Hospital Stay: Admission: RE | Admit: 2016-03-27 | Payer: Medicaid Other | Source: Ambulatory Visit

## 2016-03-27 NOTE — Assessment & Plan Note (Signed)
Patient requesting switch from fioricet due to apap content. Discussed trial of propranolol which will be used as a preventative, discussed he needs to take this as daily medicine and not as needed.

## 2016-03-27 NOTE — Assessment & Plan Note (Signed)
Last UDS expectedly positive for oxycodone and its metabolites. Patient is interested in trying to recreate prior situation from December, which I feel is reasonable since his urine was in fact more dilute (as evidence by creatinine level), so he will come in for observed therapy again and then try almost like a "washout" with fluids that [place]; he will urinate and drink a fair amount of water prior to collecting this UDS. Also discussed with him that I would try and speak with [facility] to see what genetic testing they have to evaluate his ability to metabolize opioids (I am not sure if the testing they offer is solely for determining his response, or his rate of metabolism). He has additionally tried for 3 months at 7.5mg  oxycodone dose however he has reported significant worsening of his overall function and wishes to go back to 10mg , which was given today. Follow up 1 month.

## 2016-03-30 ENCOUNTER — Telehealth: Payer: Self-pay | Admitting: *Deleted

## 2016-03-30 NOTE — Telephone Encounter (Signed)
Pharmacist from [facility] called stating they received a Rx for Lyrica 300 mg on 03/21/16; #60 and on 03/09/16 Lyrica 200 mg.  Which dosage is correct or should patient be taking both. Please give them a call at [phone].  Derl Barrow, RN

## 2016-03-30 NOTE — Telephone Encounter (Signed)
Returned call/left VM for [facility] to clarify his dose was increased at last office visit to 300mg , and to not fill the 200mg  lyrica.

## 2016-04-01 ENCOUNTER — Encounter: Payer: Self-pay | Admitting: Family Medicine

## 2016-04-04 ENCOUNTER — Other Ambulatory Visit: Payer: Self-pay | Admitting: Family Medicine

## 2016-04-08 NOTE — Telephone Encounter (Signed)
2nd request.  Salma Walrond L, RN  

## 2016-04-11 ENCOUNTER — Encounter: Payer: Self-pay | Admitting: Family Medicine

## 2016-04-11 ENCOUNTER — Ambulatory Visit (INDEPENDENT_AMBULATORY_CARE_PROVIDER_SITE_OTHER): Payer: Medicaid Other | Admitting: Family Medicine

## 2016-04-11 VITALS — BP 139/80 | HR 69 | Temp 98.4°F | Ht 71.0 in | Wt 236.0 lb

## 2016-04-11 DIAGNOSIS — F411 Generalized anxiety disorder: Secondary | ICD-10-CM

## 2016-04-11 DIAGNOSIS — R51 Headache: Secondary | ICD-10-CM

## 2016-04-11 DIAGNOSIS — G894 Chronic pain syndrome: Secondary | ICD-10-CM | POA: Diagnosis present

## 2016-04-11 DIAGNOSIS — R519 Headache, unspecified: Secondary | ICD-10-CM

## 2016-04-11 MED ORDER — BUSPIRONE HCL 10 MG PO TABS: 10.0000 mg | ORAL_TABLET | Freq: Two times a day (BID) | ORAL | Status: DC

## 2016-04-11 MED ORDER — BUSPIRONE HCL 7.5 MG PO TABS: 7.5000 mg | ORAL_TABLET | Freq: Two times a day (BID) | ORAL | Status: DC

## 2016-04-11 MED ORDER — OXYCODONE-ACETAMINOPHEN 10-325 MG PO TABS: 1.0000 | ORAL_TABLET | ORAL | Status: DC | PRN

## 2016-04-11 NOTE — Patient Instructions (Signed)
Take 7.5mg  buspirone twice a Tabar. After 14 days (when you run out of this) switch to the 10mg  buspirone tablets twice a [place].

## 2016-04-11 NOTE — Telephone Encounter (Signed)
Phoned in #20

## 2016-04-11 NOTE — Progress Notes (Signed)
   Subjective:    Patient ID: [name] [name], male    DOB: [birth year], 55 y.o.   MRN: [id number]  HPI  CC: follow up   # Chronic pain management:  Recent drug screen: had observed therapy at nurse clinic in the morning and did also take one more tablet during the [place] (4 hours later as prescribed). Came in for UDS later that [place]. Says he drank 16oz of water prior to the urine drug screen.   He says that he feels like the pain medicine works better when he is dehydrated/urine is darker.  # Anxiety  Severely increased  Girlfriend [name] [name] has been going through several issues: family member loss, car issue (though found a new one), now lost her job. This has caused severe financial stress   He does continue to get very frustrated and upset about some of his persistent word finding issues after the stroke.  He had refilled the ativan and was using this more frequently in past month.  ROS: no SI or HI  # Headache  Propranolol: says he was getting more frequent headache when he started this. The headache felt different than his normal headaches which seem to originate from his neck (which can last from 1 hour to the entire [place], usually 2-3 times a week).   Has been trying a TENS unit on his neck - not noticing much difference.   He had switched back to using the fioricet, for 3 days was taking both fioricet and propranolol, but has now stopped propranolol.   Social Hx: current smoker  Review of Systems   See HPI for ROS.   Past medical history, surgical, family, and social history reviewed and updated in the EMR as appropriate. Objective:  BP 139/80 mmHg  Pulse 69  Temp(Src) 98.4 F (36.9 C) (Oral)  Ht 5\' 11"  (1.803 m)  Wt 236 lb (107.049 kg)  BMI 32.93 kg/m2 Vitals and nursing note reviewed  General: no apparent distress  Neck: TTP midline c-spine, paraspinal bilaterally with some noted spasm. No step off deformity Psych: some visible tremors and does get upset when discussing  some topics, but overall normal speech and thought content.   Assessment & Plan:  Chronic pain syndrome Most recent UDS negative for oxycodone + metabolites -- this is interesting because I was under the impression that he was going to be drinking a significant amount of water throughout the [place] but he reports that he really just drank 16 oz prior to giving the urine sample. I would like to still call the [facility]ologist to see if this would potentially be enough to dilute (I am not convinced that this would be enough). Patient seemed to be believing that getting his urine to dilute levels would alter his response to the pain medicine -- had a long discussion and explanation with him that this is not likely the case. Follow up 1 month.  Anxiety state Seems to have significantly worsened with financial issues being a large contributor. Patient interested in adding on buspirone, will start this and have him titrate prior to next visit.   Headache Did not seem to tolerate propranolol, he felt it was causing a headache. He returned to using fiorcet. I discussed with him again that it would be beneficial to titrate off fioricet as it may be contributing to rebound headaches for him.     Return in about 1 month (around 05/11/2016).

## 2016-04-12 ENCOUNTER — Telehealth: Payer: Self-pay | Admitting: Family Medicine

## 2016-04-12 NOTE — Telephone Encounter (Signed)
Will forward to referral coordinator to see if we can get a new authorization. Dakia Schifano,CMA

## 2016-04-12 NOTE — Telephone Encounter (Signed)
Had MRI scheduled on May 21. It was cancelled by pt. It has been rescheduled June 13.  Pt has been told another prior authorization is needed.

## 2016-04-12 NOTE — Telephone Encounter (Signed)
Per [employer], authorization for MRI does not expire until 04/20/16. MRI is scheduled for 04/19/16. New authorization should not be necessary.

## 2016-04-19 ENCOUNTER — Inpatient Hospital Stay: Admission: RE | Admit: 2016-04-19 | Payer: Medicaid Other | Source: Ambulatory Visit

## 2016-04-19 NOTE — Assessment & Plan Note (Signed)
Did not seem to tolerate propranolol, he felt it was causing a headache. He returned to using fiorcet. I discussed with him again that it would be beneficial to titrate off fioricet as it may be contributing to rebound headaches for him.

## 2016-04-19 NOTE — Assessment & Plan Note (Signed)
Most recent UDS negative for oxycodone + metabolites -- this is interesting because I was under the impression that he was going to be drinking a significant amount of water throughout the [place] but he reports that he really just drank 16 oz prior to giving the urine sample. I would like to still call the [facility]ologist to see if this would potentially be enough to dilute (I am not convinced that this would be enough). Patient seemed to be believing that getting his urine to dilute levels would alter his response to the pain medicine -- had a long discussion and explanation with him that this is not likely the case. Follow up 1 month.

## 2016-04-19 NOTE — Assessment & Plan Note (Signed)
Seems to have significantly worsened with financial issues being a large contributor. Patient interested in adding on buspirone, will start this and have him titrate prior to next visit.

## 2016-04-24 ENCOUNTER — Other Ambulatory Visit: Payer: Medicaid Other

## 2016-04-29 ENCOUNTER — Other Ambulatory Visit: Payer: Self-pay | Admitting: *Deleted

## 2016-05-03 ENCOUNTER — Telehealth: Payer: Self-pay | Admitting: *Deleted

## 2016-05-03 NOTE — Telephone Encounter (Signed)
Pt states that ever since increasing to buspar 10mg s he has been dizzy.  Attempted to make appointment for patient, but he refuses (stating I dont have anyone to drive me).  Advised that I could send massage to MD but I really recommend he go to the ED or make appointment here.  Pt is upset that I recommended this and just wants me to send a message to Dr. Lamar Benes. Fleeger, Salome Spotted, CMA

## 2016-05-06 MED ORDER — LORAZEPAM 1 MG PO TABS: 1.0000 mg | ORAL_TABLET | Freq: Two times a day (BID) | ORAL | Status: DC | PRN

## 2016-05-06 NOTE — Telephone Encounter (Signed)
rx called into pharmacy, pt notified. Page, cma.

## 2016-05-06 NOTE — Telephone Encounter (Signed)
Called and left VM asking to return call. Buspar could be contributing to his dizziness, and he has basically 2 options: discontinue the buspar, or continue and be very cautious of falls and see if this improves. As we discussed in our last clinic visit there really isn't much more add on therapies to help with his anxiety, we can consider switching medicines but that would not be a quick fix, and it would be best if he scheduled an appointment with his next PCP to discuss.

## 2016-05-12 ENCOUNTER — Other Ambulatory Visit: Payer: Self-pay | Admitting: *Deleted

## 2016-05-12 NOTE — Telephone Encounter (Signed)
Pt calls to see if Dr. Gerarda Fraction has any other appointments before 05/27/16 (he is already scheduled).  Unfortunately she does not and pt has the following refill issue:  1.  Lorazepam is out 05/15/16 (sunday) 2.  Percocet is out 05/20/16  These will both be due before he comes in for an appointment.  He is requesting to be able to pick both of these up tomorrow (05/13/16) and have the percocet dated to 05/20/16 as he does not need to pick this up until then, but has limited transportation to get the the office. Verlie Hellenbrand, Salome Spotted, CMA

## 2016-05-12 NOTE — Telephone Encounter (Signed)
Will forward to MD to advise. Jazmin [name],CMA  

## 2016-05-13 MED ORDER — LORAZEPAM 1 MG PO TABS: 1.0000 mg | ORAL_TABLET | Freq: Two times a day (BID) | ORAL | Status: DC | PRN

## 2016-05-13 MED ORDER — OXYCODONE-ACETAMINOPHEN 10-325 MG PO TABS: 1.0000 | ORAL_TABLET | ORAL | Status: DC | PRN

## 2016-05-13 NOTE — Telephone Encounter (Signed)
Pt has an appt with pcp on 05-27-16.  Patient is aware that script is ready for pick up. Jazmin [name],CMA

## 2016-05-13 NOTE — Telephone Encounter (Signed)
Limited refills given. Ready for pick up. Patient needs to make sure he keeps his appointment to get additional refills.

## 2016-05-16 ENCOUNTER — Other Ambulatory Visit: Payer: Self-pay | Admitting: Obstetrics and Gynecology

## 2016-05-16 ENCOUNTER — Other Ambulatory Visit: Payer: Self-pay | Admitting: Family Medicine

## 2016-05-16 MED ORDER — OXYCODONE-ACETAMINOPHEN 10-325 MG PO TABS: 1.0000 | ORAL_TABLET | ORAL | Status: DC | PRN

## 2016-05-16 MED ORDER — LORAZEPAM 1 MG PO TABS: 1.0000 mg | ORAL_TABLET | Freq: Two times a day (BID) | ORAL | Status: DC | PRN

## 2016-05-16 NOTE — Progress Notes (Signed)
Patient returned to office after receiving Percocet prescription for 42 tabs (to start 7/14) by Dr. Gerarda Fraction. Follow up scheduled at 2 PM on 7/21. Patient states that 42 tablets is not enough to get him to his appointment. He states that he has enough tablets to get him through 7/13. If he starts taking 6 tablets per [place] on the 14th he would only have enough to get him though the 20th (7 full days). He is concerned that he would (withdrawl) if he did not have medications for the morning of the 21st.   I attempted to page Dr. Gerarda Fraction to discuss the patient. Unable to reach her. Collected and disposed on prescription from Dr. Gerarda Fraction. Wrote new prescription for 48 tablets. Encouraged patient to keep records of medication doses prior to next visit as he will likely need a repeat urine drug screen (previous negative screens).

## 2016-05-16 NOTE — Progress Notes (Signed)
Discussed extensively in clinic today patient's pain regimen. He received initially 30 tabs to last him until our appointment on 7/21. The percocet is a prn q4hr medication. Patient is not taking it prn and taking it every 4 hours scheduled. I added an additional 12 tabs after discussion with patient. He has been kicked out of 2 pain clinics and has had negative UDS screens. I discussed how I did not feel comfortable with patient's pain regimen and we would discuss further at his upcoming visit. Personally I am not comfortable with the amount and frequency patient is taking these medications. I will follow-up with him at our visit to see if we can come to an agreement on pain management and likely refer again to pain clinic. Otherwise I am afraid he may need a new PCP.

## 2016-05-27 ENCOUNTER — Ambulatory Visit: Payer: Medicaid Other | Admitting: Obstetrics and Gynecology

## 2016-05-30 ENCOUNTER — Telehealth: Payer: Self-pay | Admitting: *Deleted

## 2016-05-30 ENCOUNTER — Encounter: Payer: Self-pay | Admitting: Obstetrics and Gynecology

## 2016-05-30 ENCOUNTER — Ambulatory Visit (INDEPENDENT_AMBULATORY_CARE_PROVIDER_SITE_OTHER): Payer: Medicaid Other | Admitting: Obstetrics and Gynecology

## 2016-05-30 VITALS — BP 148/88 | HR 72 | Temp 98.4°F | Wt 222.0 lb

## 2016-05-30 DIAGNOSIS — G894 Chronic pain syndrome: Secondary | ICD-10-CM

## 2016-05-30 MED ORDER — PREGABALIN 300 MG PO CAPS: 300.0000 mg | ORAL_CAPSULE | Freq: Two times a day (BID) | ORAL | 1 refills | Status: DC

## 2016-05-30 MED ORDER — LORAZEPAM 1 MG PO TABS: 1.0000 mg | ORAL_TABLET | Freq: Two times a day (BID) | ORAL | 0 refills | Status: DC | PRN

## 2016-05-30 MED ORDER — OXYCODONE-ACETAMINOPHEN 10-325 MG PO TABS: 1.0000 | ORAL_TABLET | ORAL | 0 refills | Status: DC | PRN

## 2016-05-30 NOTE — Patient Instructions (Signed)
Pain contract signed today Referral made to pain clinic Will not be giving these doses of pain medications so if unable to get pain clinic will need to discuss better regimen.   Return to clinic in 3 weeks.  Chronic Pain Chronic pain can be defined as pain that is off and on and lasts for 3-6 months or longer. Many things cause chronic pain, which can make it difficult to make a diagnosis. There are many treatment options available for chronic pain. However, finding a treatment that works well for you may require trying various approaches until the right one is found. Many people benefit from a combination of two or more types of treatment to control their pain. SYMPTOMS  Chronic pain can occur anywhere in the body and can range from mild to very severe. Some types of chronic pain include:  Headache.  Low back pain.  Cancer pain.  Arthritis pain.  Neurogenic pain. This is pain resulting from damage to nerves. People with chronic pain may also have other symptoms such as:  Depression.  Anger.  Insomnia.  Anxiety. DIAGNOSIS  Your health care provider will help diagnose your condition over time. In many cases, the initial focus will be on excluding possible conditions that could be causing the pain. Depending on your symptoms, your health care provider may order tests to diagnose your condition. Some of these tests may include:   Blood tests.   CT scan.   MRI.   X-rays.   Ultrasounds.   Nerve conduction studies.  You may need to see a specialist.  TREATMENT  Finding treatment that works well may take time. You may be referred to a pain specialist. He or she may prescribe medicine or therapies, such as:   Mindful meditation or yoga.  Shots (injections) of numbing or pain-relieving medicines into the spine or area of pain.  Local electrical stimulation.  Acupuncture.   Massage therapy.   Aroma, color, light, or sound therapy.   Biofeedback.    Working with a physical therapist to keep from getting stiff.   Regular, gentle exercise.   Cognitive or behavioral therapy.   Group support.  Sometimes, surgery may be recommended.  HOME CARE INSTRUCTIONS   Take all medicines as directed by your health care provider.   Lessen stress in your life by relaxing and doing things such as listening to calming music.   Exercise or be active as directed by your health care provider.   Eat a healthy diet and include things such as vegetables, fruits, fish, and lean meats in your diet.   Keep all follow-up appointments with your health care provider.   Attend a support group with others suffering from chronic pain. SEEK MEDICAL CARE IF:   Your pain gets worse.   You develop a new pain that was not there before.   You cannot tolerate medicines given to you by your health care provider.   You have new symptoms since your last visit with your health care provider.  SEEK IMMEDIATE MEDICAL CARE IF:   You feel weak.   You have decreased sensation or numbness.   You lose control of bowel or bladder function.   Your pain suddenly gets much worse.   You develop shaking.  You develop chills.  You develop confusion.  You develop chest pain.  You develop shortness of breath.  MAKE SURE YOU:  Understand these instructions.  Will watch your condition.  Will get help right away if you are not doing  well or get worse.   This information is not intended to replace advice given to you by your health care provider. Make sure you discuss any questions you have with your health care provider.   Document Released: 07/16/2002 Document Revised: 06/26/2013 Document Reviewed: 04/19/2013 Elsevier Interactive Patient Education [employer].

## 2016-05-30 NOTE — Telephone Encounter (Signed)
Pharmacy called and states that patient did fill his script today for lorazepam but would like to get refills put on this script.  Will forward to MD since patient is due to come back in 1 month for a follow up. Antionette Luster,CMA

## 2016-05-30 NOTE — Telephone Encounter (Signed)
Pt calling in for the 2nd time to talk to a supervisor about getting the emails between him and dr wight attached to his urine results. Please advise. Deseree Kennon Holter, CMA

## 2016-05-31 NOTE — Telephone Encounter (Signed)
Spoke with pt again, he was able to review his own my chart messages between him and Dr. Lamar Benes. Pt stated he noticed that Dr. Lamar Benes said he would follow-up with a toxicologist in regards to his urine drug screens being negative when he was witnessed taking his prescription. The patient wants you to pick up where Dr. Lamar Benes left off... Dr. Lamar Benes never called the toxicologist from what I can see... Pt is just really frustrated and he is not understanding well. Pt wants to know why his drug screens keep coming back negative when they should be positive. Per pt, this is why he has been "kicked" out of pain clinics in the past, that and not being being able to get a ride. Please advise.

## 2016-05-31 NOTE — Telephone Encounter (Signed)
I do not know what supervisor he is wanting to talk to about this but since Dr. Marissa Calamity Epic is no longer. Please inform patient that I will make sure to chart all our conversations and tests in his chart.

## 2016-05-31 NOTE — Telephone Encounter (Signed)
Spoke with pt and told him all conversations he has had with Dr. Lamar Benes via "emai" can be found in his mychart. Pt logged on (at his home) we looked together. In the middle of our conversation we were disconnected. Essentially, I think pt is happy that he is able to see his conversations with Dr. Lamar Benes.

## 2016-05-31 NOTE — Telephone Encounter (Signed)
No refills on medication at this time. All his medications were filled for 30 days only at this time until we follow-up at our next (already scheduled) clinic visit.

## 2016-06-01 NOTE — Progress Notes (Signed)
Subjective:   Patient ID: [name] [name], male    DOB: 01/13/1961, 55 y.o.   MRN: [id number]  Patient presents for Same [place] Appointment  Chief Complaint  Patient presents with  . Medication Refill    HPI: # Med Refill  Patient presents to ckinic to discuss is medications and to get refills. Patient is taking percocet 10-325mg  q4hrs, ativan 1mg  BID, and lyrica. He needs refills on these medications.  Patient states he takes theseses medications for his anxiety and chronic pain. He has a history of neuropahty and back pain. He states he has been on these medications for years.   He wants to to see if he can get into another pain clinic however he is worried because he has been discharged from two pain clinics. According to paitent this is due to him not being able to randomly come in for drug test due to transportation issues. Patient is also concerned because he sometimes urinates clean on UDS (medications do not show up). He even took medication in front of nurse (which is documented) and a UDS after which was negative.    Review of Systems   See HPI for ROS.   Past medical history, surgical, family, and social history reviewed and updated in the EMR as appropriate.  Objective:  BP (!) 148/88   Pulse 72   Temp 98.4 F (36.9 C) (Oral)   Wt 222 lb (100.7 kg)   BMI 30.96 kg/m  Vitals and nursing note reviewed  Physical Exam  Constitutional: He is oriented to person, place, and time and well-developed, well-nourished, and in no distress.  Cardiovascular: Normal rate.   Pulmonary/Chest: Effort normal.  Musculoskeletal: Normal range of motion. He exhibits tenderness. He exhibits no edema.  Neurological: He is alert and oriented to person, place, and time. He displays weakness. A sensory deficit is present. Gait abnormal.  Psychiatric: His mood appears anxious. He is agitated.  normal speech and thought content.     Assessment & Plan:  1. Chronic pain syndrome.  Has chronic back  and neck pain with prior surgeries. Presented today for chroic pain medication refill. Concern in this patient for medication misuse in setting of recurrent negative UDS (can just be the way patient metabolizes medication since it has been witnessed and charted that he took meds), discharged from 2 prior pain clinics, and his misuse of medication dosing (prn vs scheduled). Patient is dependent on medications at this time.    Pain contract signed today (has never had one before) and discussed this will only be in place until he gets into a pain clinic. High concern that he may not be accepted into pain clinic due to his history but would strongly push for this as think he would be the ideal candidate for pain management through pain clinic. I do not think we could do a good job monitoring his pain and medications as needed on such high doses of meds. UDS scheduled for 2-3 days prior to our clinic visit and prior to him running out of meds. However all patient's UDS prior have not been random which is concerning (all scheduled and patient aware of them). Will need to find a way to randomize a UDS to look for other substance use. Needs review in database at next visit.    Will send this patient to our clinic internal review board for recommendations as well. Refilled oxycodone 10-325 q4hrs prn, ativan, and lyrica.  - Ambulatory referral to Pain Clinic  Meds ordered this encounter  Medications  . oxyCODONE-acetaminophen (PERCOCET) 10-325 MG tablet    Sig: Take 1 tablet by mouth every 4 (four) hours as needed for pain. Refill after 05/20/16    Dispense:  180 tablet    Refill:  0  . LORazepam (ATIVAN) 1 MG tablet    Sig: Take 1 tablet (1 mg total) by mouth 2 (two) times daily as needed.    Dispense:  60 tablet    Refill:  0    Not to exceed 5 additional fills before 09/05/2016  . pregabalin (LYRICA) 300 MG capsule    Sig: Take 1 capsule (300 mg total) by mouth 2 (two) times daily.    Dispense:  60  capsule    Refill:  1    Not to exceed 3 additional fills before 03/01/2016     Luiz Blare, DO 06/01/2016, 7:06 PM PGY-3, [place]

## 2016-06-03 ENCOUNTER — Encounter: Payer: Self-pay | Admitting: Obstetrics and Gynecology

## 2016-06-06 NOTE — Telephone Encounter (Signed)
Talked with patient at office visit and we have been communicating via [place]. I can view all of his messages with Dr. Lamar Benes and I see the encounter where he was witnessed taking his medications. As I discussed at out appointment we will get a UDS prior to his appointment and go from there. Lets cross these hurtels as they come up. Hoping he is able to get into a pain clinic.

## 2016-06-26 ENCOUNTER — Other Ambulatory Visit: Payer: Self-pay | Admitting: Family Medicine

## 2016-06-27 ENCOUNTER — Telehealth: Payer: Self-pay | Admitting: Obstetrics and Gynecology

## 2016-06-27 ENCOUNTER — Ambulatory Visit: Payer: Medicaid Other | Admitting: Obstetrics and Gynecology

## 2016-06-27 ENCOUNTER — Encounter: Payer: Self-pay | Admitting: Obstetrics and Gynecology

## 2016-06-27 ENCOUNTER — Other Ambulatory Visit: Payer: Self-pay | Admitting: Obstetrics and Gynecology

## 2016-06-27 MED ORDER — LORAZEPAM 1 MG PO TABS: 1.0000 mg | ORAL_TABLET | Freq: Two times a day (BID) | ORAL | 0 refills | Status: DC | PRN

## 2016-06-27 NOTE — Telephone Encounter (Signed)
Pt called and was hoping that we could call in the Ativan for him instead of him coming here. He has no gas for his vehicle to come and pick up. Please let patient know if we can or can not. jw

## 2016-06-27 NOTE — Telephone Encounter (Signed)
Will forward to MD. [name] [name],CMA  

## 2016-06-29 NOTE — Telephone Encounter (Signed)
Called to discuss UDS, pain meds, and Ativan with the patient. Of course he was not happy with my recommendations. I did call and refill his Ativan as to prevent withdrawal. Discussed with him that for any additional refills he will need to pick them up in clinic. He will be out of pain medication tonight and will not be back in to clinic until 8/29 appt. He understands he may withdraw.   Patient may need to switch providers at this point as he is not willing to comply with my recommendations. He needs a random UDS (ie- come give a UDS prior to running out of pain meds) but patient wants a witnessed scheduled UDS time. Told him no I want to see what he urinates without someone having to watch him take a pill.  Again he has been kicked out of 2 pain clinics. Denied by several others. Has been reported to the police by prior pain clinic for clean urine due to concern for diversion. I have already discussed case with supervisor Dr. Gwendlyn Deutscher.

## 2016-06-29 NOTE — Telephone Encounter (Signed)
Pt called back. He was upset after the call with Dr Gerarda Fraction. He is upset about the urine drug screen. He feels he has been accused of being a [employer] forces technical [employer]. He would like to speak with a supervisor

## 2016-07-01 ENCOUNTER — Encounter: Payer: Self-pay | Admitting: Obstetrics and Gynecology

## 2016-07-01 ENCOUNTER — Telehealth: Payer: Self-pay | Admitting: Family Medicine

## 2016-07-01 NOTE — Telephone Encounter (Signed)
Called patient because of his request to speak with the director of the program. It was a difficult conversation in which he repeatedly danced around structure that is required with controled substance prescribing.  He embraced the victim role in explaining why he could not comply.  For example, his main concern is that he is out of pain meds today.  I asked him pointedly why he did not keep his three week follow up visit.  His reply was that he did not have money for gas due to wife losing a benefit.  I explained that we must have this structure.  He argued that he was on a "low dose" of meds now.  I replied that 60 mg/[place] of oxy alone would be a high dose and that it was especially high given his concominent use of benzo.    I support firm limits.  I offered a thorough review in complex patient clinic.  I support/encourage further reduction in our prescribing of controled substances for him.

## 2016-07-01 NOTE — Telephone Encounter (Signed)
Pt called to and wanted me to take him out of the system. He no longer wishes to be our patient. He is establishing care at [facility]. He has a new patient appointment for 07/18/16 with them. Blima Rich

## 2016-07-05 ENCOUNTER — Ambulatory Visit: Payer: Medicaid Other | Admitting: Obstetrics and Gynecology

## 2016-07-19 DIAGNOSIS — Z86711 Personal history of pulmonary embolism: Secondary | ICD-10-CM | POA: Insufficient documentation

## 2016-07-19 DIAGNOSIS — F17210 Nicotine dependence, cigarettes, uncomplicated: Secondary | ICD-10-CM | POA: Insufficient documentation

## 2016-07-19 DIAGNOSIS — F411 Generalized anxiety disorder: Secondary | ICD-10-CM | POA: Insufficient documentation

## 2016-07-19 HISTORY — DX: Personal history of pulmonary embolism: Z86.711

## 2016-08-01 ENCOUNTER — Encounter: Payer: Self-pay | Admitting: Obstetrics and Gynecology

## 2016-08-16 ENCOUNTER — Other Ambulatory Visit: Payer: Self-pay | Admitting: Family Medicine

## 2016-08-16 NOTE — Telephone Encounter (Signed)
Are we still responsible for sending in his medications??

## 2016-08-17 NOTE — Telephone Encounter (Signed)
No he should contact his new practice/PCP to refill. From our standpoint he no longer is a part of our practice and if he wants to come back he has to go through the whole new patient process.

## 2018-07-23 DIAGNOSIS — Z7901 Long term (current) use of anticoagulants: Secondary | ICD-10-CM | POA: Insufficient documentation

## 2018-11-06 ENCOUNTER — Emergency Department (HOSPITAL_COMMUNITY): Payer: Medicaid Other

## 2018-11-06 ENCOUNTER — Emergency Department (HOSPITAL_COMMUNITY)
Admission: EM | Admit: 2018-11-06 | Discharge: 2018-11-06 | Disposition: A | Discharge status: Home / Self Care | Payer: Medicaid Other | Attending: Emergency Medicine | Admitting: Emergency Medicine

## 2018-11-06 ENCOUNTER — Encounter (HOSPITAL_COMMUNITY): Payer: Self-pay

## 2018-11-06 ENCOUNTER — Other Ambulatory Visit: Payer: Self-pay

## 2018-11-06 DIAGNOSIS — F17210 Nicotine dependence, cigarettes, uncomplicated: Secondary | ICD-10-CM | POA: Insufficient documentation

## 2018-11-06 DIAGNOSIS — M25572 Pain in left ankle and joints of left foot: Secondary | ICD-10-CM | POA: Diagnosis not present

## 2018-11-06 DIAGNOSIS — Z7901 Long term (current) use of anticoagulants: Secondary | ICD-10-CM | POA: Diagnosis not present

## 2018-11-06 DIAGNOSIS — J449 Chronic obstructive pulmonary disease, unspecified: Secondary | ICD-10-CM | POA: Diagnosis not present

## 2018-11-06 DIAGNOSIS — Z79899 Other long term (current) drug therapy: Secondary | ICD-10-CM | POA: Insufficient documentation

## 2018-11-06 DIAGNOSIS — I10 Essential (primary) hypertension: Secondary | ICD-10-CM | POA: Diagnosis not present

## 2018-11-06 HISTORY — DX: Cerebral infarction, unspecified: I63.9

## 2018-11-06 NOTE — ED Provider Notes (Signed)
[facility] EMERGENCY DEPARTMENT Provider Note   CSN: [id number] Arrival date & time: 11/06/18  6073     History   Chief Complaint Chief Complaint  Patient presents with  . Ankle Pain    HPI [name] [name] is a 57 y.o. male with history of prior CVA, neuropathy of the left sciatic nerve who presents emergency room today for left ankle pain.  Patient reports that approximately 1 hour ago he was using his walker to get down his stairs when he rolled his ankle in a inversion fashion.  He now has pain over the medial malleolus.  He notes some associated swelling.  He denies falls or head trauma from this.  He has been able to bear some weight on the ankle using his walker.  He notes he has baseline neuropathy but denies any new numbness or tingling.  He denies any new weakness.  He has not tried anything for symptoms.  Patient is on Suboxone.  HPI  Past Medical History:  Diagnosis Date  . Anxiety   . Constipation due to pain medication   . COPD (chronic obstructive pulmonary disease) ([place])   . CVA (cerebral infarction) 09/18/2015  . Hypertension   . Migraine   . 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 PE (pulmonary embolism)    DVT left leg  . Status post spinal surgery   . Stroke ([place])   . Tobacco abuse     Patient Active Problem List   Diagnosis Date Noted  . Headache 01/25/2016  . Tobacco use disorder   . Fall   . Aphasia due to recent stroke   . Stroke ([place]) 09/18/2015  . Right sided weakness 09/18/2015  . Right knee pain 07/21/2015  . Wound of skin 06/03/2015  . Chronic pain syndrome 05/28/2014  . Anxiety state 05/28/2014  . 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 Depression 03/24/2014  . Low back pain 02/16/2014  . Left foot drop 02/14/2014  . HTN (hypertension) 11/15/2011  . Hyperlipidemia 11/15/2011  . COPD (chronic obstructive pulmonary disease) ([place]) 11/15/2011  . Tobacco abuse 11/15/2011    Past Surgical History:  Procedure  Laterality Date  . SPINAL FIXATION SURGERY     cervical        Home Medications    Prior to Admission medications   Medication Sig Start Date End Date Taking? Authorizing Provider  albuterol (PROVENTIL HFA;VENTOLIN HFA) 108 (90 BASE) MCG/ACT inhaler Inhale 2 puffs into the lungs every 4 (four) hours as needed for wheezing. Reported on 04/11/2016    [provider]  amLODipine (NORVASC) 10 MG tablet Take 1 tablet (10 mg total) by mouth daily. 01/21/16   Leone Brand, MD  atorvastatin (LIPITOR) 80 MG tablet TAKE 1 TABLET (80 MG TOTAL) BY MOUTH DAILY AT 6 PM. 03/09/16   Leone Brand, MD  busPIRone (BUSPAR) 10 MG tablet Take 1 tablet (10 mg total) by mouth 2 (two) times daily. Start after finishing 7.5mg  tablets 04/11/16   Leone Brand, MD  busPIRone (BUSPAR) 7.5 MG tablet Take 1 tablet (7.5 mg total) by mouth 2 (two) times daily. For 14 days, then increase to 10mg  twice daily 04/11/16   Leone Brand, MD  finasteride (PROSCAR) 5 MG tablet Take 1 tablet (5 mg total) by mouth daily. 03/09/16   Leone Brand, MD  lisinopril (PRINIVIL,ZESTRIL) 40 MG tablet Take 1 tablet (40 mg total) by mouth daily. 03/09/16   Leone Brand,  MD  LORazepam (ATIVAN) 1 MG tablet Take 1 tablet (1 mg total) by mouth 2 (two) times daily as needed. 06/27/16   Katheren Shams, DO  oxyCODONE-acetaminophen (PERCOCET) 10-325 MG tablet Take 1 tablet by mouth every 4 (four) hours as needed for pain. Refill after 05/20/16 05/30/16   Katheren Shams, DO  pregabalin (LYRICA) 300 MG capsule Take 1 capsule (300 mg total) by mouth 2 (two) times daily. 05/30/16   Katheren Shams, DO  propranolol (INDERAL) 20 MG tablet Take 1 tablet (20 mg total) by mouth 3 (three) times daily. Patient not taking: Reported on 04/11/2016 03/21/16   Leone Brand, MD  rivaroxaban Alveda Reasons) 20 MG TABS tablet TAKE 1 TABLET BY MOUTH EVERY [place] WITH SUPPER 03/09/16   Leone Brand, MD  traZODone (DESYREL) 100 MG tablet TAKE 1 TABLET BY MOUTH AT BEDTIME  06/27/16   Luiz Blare Y, DO  venlafaxine XR (EFFEXOR-XR) 150 MG 24 hr capsule TAKE ONE CAPSULE BY MOUTH EVERY [place] 01/20/16   Leone Brand, MD    Family History Family History  Problem Relation Age of Onset  . Cancer Mother   . Depression Sister     Social History Social History   Tobacco Use  . Smoking status: Current Every Mixson Smoker    Packs/Wagley: 1.00    Types: Cigarettes  . Smokeless tobacco: Never Used  . Tobacco comment: increased to 2ppd  Substance Use Topics  . Alcohol use: No    Alcohol/week: 0.0 standard drinks    Comment: former  . Drug use: No     Allergies   Fentanyl and Methadone   Review of Systems Review of Systems  Constitutional: Negative for fever.  Musculoskeletal: Positive for arthralgias and joint swelling.  Skin: Negative for color change and wound.  Neurological: Negative for weakness and numbness.     Physical Exam Updated Vital Signs BP (!) 164/83 (BP Location: Left Arm)   Pulse 70   Temp 97.8 F (36.6 C) (Oral)   Resp 14   Ht 5\' 11"  (1.803 m)   Wt 115.7 kg   SpO2 97%   BMI 35.57 kg/m   Physical Exam Vitals signs and nursing note reviewed.  Constitutional:      Appearance: He is well-developed.  HENT:     Head: Normocephalic and atraumatic.     Right Ear: External ear normal.     Left Ear: External ear normal.  Eyes:     General: No scleral icterus.       Right eye: No discharge.        Left eye: No discharge.     Conjunctiva/sclera: Conjunctivae normal.  Cardiovascular:     Pulses:          Dorsalis pedis pulses are 2+ on the left side.       Posterior tibial pulses are 2+ on the left side.  Pulmonary:     Effort: Pulmonary effort is normal. No respiratory distress.  Musculoskeletal:     Left ankle: He exhibits no swelling, no ecchymosis and no deformity. Tenderness. Medial malleolus tenderness found. No proximal fibula tenderness found. Achilles tendon normal.     Left foot: Normal.  Skin:    General: Skin is  warm.     Coloration: Skin is not pale.     Findings: No wound.  Neurological:     Mental Status: He is alert.     Sensory: Sensation is intact.     Comments: Able strength  for plantar flexion and dorsiflexion      ED Treatments / Results  Labs (all labs ordered are listed, but only abnormal results are displayed) Labs Reviewed - No data to display  EKG None  Radiology Dg Ankle Complete Left  Result Date: 11/06/2018 CLINICAL DATA:  Twisting injury of the left ankle resulting in a fall this morning. Persistent ankle pain and swelling. History of left-sided sciatic up with decreased strength in the left lower extremity. EXAM: LEFT ANKLE COMPLETE - 3+ VIEW COMPARISON:  Left ankle series of April 08, 2014 FINDINGS: The bones are subjectively adequately mineralized. No acute fracture or dislocation is observed. An old avulsion fracture fragment from the medial malleolus is again observed. The ankle joint mortise is preserved. The talar dome is intact. There are plantar and Achilles region calcaneal spurs. The observed portions of the metatarsal bases are intact. There is diffuse soft tissue swelling. IMPRESSION: There is no acute bony abnormality of the left ankle. There is diffuse soft tissue swelling. Electronically Signed   By: David  Martinique M.D.   On: 11/06/2018 09:02    Procedures Procedures (including critical care time)  Medications Ordered in ED Medications - No data to display   Initial Impression / Assessment and Plan / ED Course  I have reviewed the triage vital signs and the nursing notes.  Pertinent labs & imaging results that were available during my care of the patient were reviewed by me and considered in my medical decision making (see chart for details).     57 y.o. male with eversion ankle injury.  No open wounds.  Patient is neurovascular intact.  Compartments are soft.  No evidence of Achilles tendon injury.  X-rays obtained and no evidence of fracture or  dislocation.  Ankle mortise is preserved.  X-rays reviewed by myself.  There is no tenderness of the proximal fibula. Will place in brace and tx with conservative therapy. Patient is on suboxone. PCP vs ortho follow up. Return precautions discussed./   Final Clinical Impressions(s) / ED Diagnoses   Final diagnoses:  Acute left ankle pain    ED Discharge Orders    None       Lorelle Gibbs 11/06/18 1612    Fredia Sorrow, MD 11/07/18 1626

## 2018-11-06 NOTE — ED Triage Notes (Signed)
Pt twisted his ankle this morning, about 45 mins ago. Is complaining of left ankle pain. Has been having decreased strength in ankle for the last 2-3 years. Is requesting an ankle brace. Cannot take pain medication. Is on Suboxone.

## 2018-11-06 NOTE — ED Notes (Signed)
Pt yelling at RN stating he needed his discharge paperwork faxed to his pain clinic doctor to get his suboxone prescription because he missed his appt today at 0930 to come to ER for ankle.  This RN explained to the patient that we are unable to fax them.  This RN confirmed information with Charge RN and Zackowski MD and we are unable to fax paperwork so he can get his prescription.  Pt very irritated and started to yell at nurse.  Pt stated " you know what, i'll just go out and get me a gram and to hell with it"

## 2019-04-19 DIAGNOSIS — R195 Other fecal abnormalities: Secondary | ICD-10-CM

## 2019-04-19 HISTORY — DX: Other fecal abnormalities: R19.5

## 2019-04-26 ENCOUNTER — Other Ambulatory Visit: Payer: Self-pay | Admitting: Nurse Practitioner

## 2019-04-26 DIAGNOSIS — M810 Age-related osteoporosis without current pathological fracture: Secondary | ICD-10-CM

## 2019-05-31 ENCOUNTER — Emergency Department (HOSPITAL_COMMUNITY)
Admission: EM | Admit: 2019-05-31 | Discharge: 2019-05-31 | Disposition: A | Discharge status: Home / Self Care | Payer: Medicaid Other | Attending: Emergency Medicine | Admitting: Emergency Medicine

## 2019-05-31 ENCOUNTER — Other Ambulatory Visit: Payer: Self-pay

## 2019-05-31 ENCOUNTER — Emergency Department (HOSPITAL_COMMUNITY): Payer: Medicaid Other

## 2019-05-31 ENCOUNTER — Encounter (HOSPITAL_COMMUNITY): Payer: Self-pay | Admitting: Emergency Medicine

## 2019-05-31 DIAGNOSIS — F1123 Opioid dependence with withdrawal: Secondary | ICD-10-CM | POA: Diagnosis not present

## 2019-05-31 DIAGNOSIS — J449 Chronic obstructive pulmonary disease, unspecified: Secondary | ICD-10-CM | POA: Insufficient documentation

## 2019-05-31 DIAGNOSIS — I10 Essential (primary) hypertension: Secondary | ICD-10-CM | POA: Diagnosis not present

## 2019-05-31 DIAGNOSIS — Z7901 Long term (current) use of anticoagulants: Secondary | ICD-10-CM | POA: Insufficient documentation

## 2019-05-31 DIAGNOSIS — I2699 Other pulmonary embolism without acute cor pulmonale: Secondary | ICD-10-CM | POA: Diagnosis not present

## 2019-05-31 DIAGNOSIS — Z79899 Other long term (current) drug therapy: Secondary | ICD-10-CM | POA: Diagnosis not present

## 2019-05-31 DIAGNOSIS — F1190 Opioid use, unspecified, uncomplicated: Secondary | ICD-10-CM

## 2019-05-31 DIAGNOSIS — F17210 Nicotine dependence, cigarettes, uncomplicated: Secondary | ICD-10-CM | POA: Diagnosis not present

## 2019-05-31 DIAGNOSIS — I82492 Acute embolism and thrombosis of other specified deep vein of left lower extremity: Secondary | ICD-10-CM | POA: Insufficient documentation

## 2019-05-31 DIAGNOSIS — F1193 Opioid use, unspecified with withdrawal: Secondary | ICD-10-CM

## 2019-05-31 DIAGNOSIS — Z20828 Contact with and (suspected) exposure to other viral communicable diseases: Secondary | ICD-10-CM | POA: Diagnosis not present

## 2019-05-31 DIAGNOSIS — T401X1A Poisoning by heroin, accidental (unintentional), initial encounter: Secondary | ICD-10-CM | POA: Diagnosis present

## 2019-05-31 LAB — CBC WITH DIFFERENTIAL/PLATELET
Abs Immature Granulocytes: 0.05 10*3/uL (ref 0.00–0.07)
Basophils Absolute: 0 10*3/uL (ref 0.0–0.1)
Basophils Relative: 0 %
Eosinophils Absolute: 0 10*3/uL (ref 0.0–0.5)
Eosinophils Relative: 0 %
HCT: 45.2 % (ref 39.0–52.0)
Hemoglobin: 15.3 g/dL (ref 13.0–17.0)
Immature Granulocytes: 1 %
Lymphocytes Relative: 13 %
Lymphs Abs: 1.4 10*3/uL (ref 0.7–4.0)
MCH: 32.2 pg (ref 26.0–34.0)
MCHC: 33.8 g/dL (ref 30.0–36.0)
MCV: 95.2 fL (ref 80.0–100.0)
Monocytes Absolute: 0.2 10*3/uL (ref 0.1–1.0)
Monocytes Relative: 2 %
Neutro Abs: 9.1 10*3/uL — ABNORMAL HIGH (ref 1.7–7.7)
Neutrophils Relative %: 84 %
Platelets: 233 10*3/uL (ref 150–400)
RBC: 4.75 MIL/uL (ref 4.22–5.81)
RDW: 13.3 % (ref 11.5–15.5)
WBC: 10.8 10*3/uL — ABNORMAL HIGH (ref 4.0–10.5)
nRBC: 0 % (ref 0.0–0.2)

## 2019-05-31 LAB — BASIC METABOLIC PANEL
Anion gap: 10 (ref 5–15)
BUN: 16 mg/dL (ref 6–20)
CO2: 21 mmol/L — ABNORMAL LOW (ref 22–32)
Calcium: 8.5 mg/dL — ABNORMAL LOW (ref 8.9–10.3)
Chloride: 106 mmol/L (ref 98–111)
Creatinine, Ser: 0.71 mg/dL (ref 0.61–1.24)
GFR calc Af Amer: >60 mL/min (ref >60)
GFR calc non Af Amer: >60 mL/min (ref >60)
Glucose, Bld: 116 mg/dL — ABNORMAL HIGH (ref 70–99)
Potassium: 3.3 mmol/L — ABNORMAL LOW (ref 3.5–5.1)
Sodium: 137 mmol/L (ref 135–145)

## 2019-05-31 LAB — SARS CORONAVIRUS 2 BY RT PCR (HOSPITAL ORDER, PERFORMED IN ~~LOC~~ HOSPITAL LAB): SARS Coronavirus 2: NEGATIVE

## 2019-05-31 MED ORDER — ONDANSETRON HCL 4 MG/2ML IJ SOLN
4.0000 mg | Freq: Once | INTRAMUSCULAR | Status: AC
  Administered 2019-05-31: 4 mg via INTRAVENOUS
  Filled 2019-05-31: qty 2

## 2019-05-31 MED ORDER — PROMETHAZINE HCL 25 MG/ML IJ SOLN: 25.0000 mg | Freq: Once | INTRAMUSCULAR | Status: DC

## 2019-05-31 MED ORDER — SODIUM CHLORIDE 0.9 % IV BOLUS
500.0000 mL | Freq: Once | INTRAVENOUS | Status: AC
  Administered 2019-05-31: 500 mL via INTRAVENOUS

## 2019-05-31 MED ORDER — KETOROLAC TROMETHAMINE 30 MG/ML IJ SOLN
30.0000 mg | Freq: Once | INTRAMUSCULAR | Status: AC
  Administered 2019-05-31: 30 mg via INTRAVENOUS
  Filled 2019-05-31: qty 1

## 2019-05-31 MED ORDER — ONDANSETRON 4 MG PO TBDP: ORAL_TABLET | ORAL | 0 refills | Status: DC

## 2019-05-31 MED ORDER — IBUPROFEN 800 MG PO TABS: 800.0000 mg | ORAL_TABLET | Freq: Three times a day (TID) | ORAL | 0 refills | Status: DC | PRN

## 2019-05-31 MED ORDER — SODIUM CHLORIDE 0.9 % IV BOLUS
1000.0000 mL | Freq: Once | INTRAVENOUS | Status: AC
  Administered 2019-05-31: 1000 mL via INTRAVENOUS

## 2019-05-31 MED ORDER — CLONIDINE HCL 0.2 MG PO TABS: 0.2000 mg | ORAL_TABLET | Freq: Once | ORAL | Status: DC

## 2019-05-31 NOTE — ED Notes (Signed)
Attempted to call pt ride, no answer.

## 2019-05-31 NOTE — ED Provider Notes (Addendum)
[facility] EMERGENCY DEPARTMENT Provider Note   CSN: [id number] Arrival date & time: 05/31/19  0450    History   Chief Complaint Chief Complaint  Patient presents with  . Drug Overdose    HPI [name] [name] is a 58 y.o. male.     Patient presents to the emergency department with complaints of generalized pain and weakness.  Patient reports that he snorted heroin earlier tonight and then he took Suboxone.  Since doing this he has started to experience nasal congestion and cannot breathe out of his nose and also is feeling agitated and restless.  He was not trying to harm himself with the ingestions.  Patient's girlfriend called EMS when he started to feel poorly.  Initially EMS was called out for shortness of breath, but when they arrived on the scene he was not having any breathing difficulty.     Past Medical History:  Diagnosis Date  . Anxiety   . Constipation due to pain medication   . COPD (chronic obstructive pulmonary disease) ([place])   . CVA (cerebral infarction) 09/18/2015  . Hypertension   . Migraine   . 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 PE (pulmonary embolism)    DVT left leg  . Status post spinal surgery   . Stroke ([place])   . Tobacco abuse     Patient Active Problem List   Diagnosis Date Noted  . Headache 01/25/2016  . Tobacco use disorder   . Fall   . Aphasia due to recent stroke   . Stroke ([place]) 09/18/2015  . Right sided weakness 09/18/2015  . Right knee pain 07/21/2015  . Wound of skin 06/03/2015  . Chronic pain syndrome 05/28/2014  . Anxiety state 05/28/2014  . 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 Depression 03/24/2014  . Low back pain 02/16/2014  . Left foot drop 02/14/2014  . HTN (hypertension) 11/15/2011  . Hyperlipidemia 11/15/2011  . COPD (chronic obstructive pulmonary disease) ([place]) 11/15/2011  . Tobacco abuse 11/15/2011    Past Surgical History:  Procedure Laterality Date  . SPINAL FIXATION  SURGERY     cervical        Home Medications    Prior to Admission medications   Medication Sig Start Date End Date Taking? Authorizing Provider  albuterol (PROVENTIL HFA;VENTOLIN HFA) 108 (90 BASE) MCG/ACT inhaler Inhale 2 puffs into the lungs every 4 (four) hours as needed for wheezing. Reported on 04/11/2016    [provider]  amLODipine (NORVASC) 10 MG tablet Take 1 tablet (10 mg total) by mouth daily. 01/21/16   Leone Brand, MD  atorvastatin (LIPITOR) 80 MG tablet TAKE 1 TABLET (80 MG TOTAL) BY MOUTH DAILY AT 6 PM. 03/09/16   Leone Brand, MD  busPIRone (BUSPAR) 10 MG tablet Take 1 tablet (10 mg total) by mouth 2 (two) times daily. Start after finishing 7.5mg  tablets 04/11/16   Leone Brand, MD  busPIRone (BUSPAR) 7.5 MG tablet Take 1 tablet (7.5 mg total) by mouth 2 (two) times daily. For 14 days, then increase to 10mg  twice daily 04/11/16   Leone Brand, MD  finasteride (PROSCAR) 5 MG tablet Take 1 tablet (5 mg total) by mouth daily. 03/09/16   Leone Brand, MD  lisinopril (PRINIVIL,ZESTRIL) 40 MG tablet Take 1 tablet (40 mg total) by mouth daily. 03/09/16   Leone Brand, MD  LORazepam (ATIVAN) 1 MG tablet Take 1 tablet (1 mg total) by mouth 2 (two)  times daily as needed. 06/27/16   Katheren Shams, DO  oxyCODONE-acetaminophen (PERCOCET) 10-325 MG tablet Take 1 tablet by mouth every 4 (four) hours as needed for pain. Refill after 05/20/16 05/30/16   Katheren Shams, DO  pregabalin (LYRICA) 300 MG capsule Take 1 capsule (300 mg total) by mouth 2 (two) times daily. 05/30/16   Katheren Shams, DO  propranolol (INDERAL) 20 MG tablet Take 1 tablet (20 mg total) by mouth 3 (three) times daily. Patient not taking: Reported on 04/11/2016 03/21/16   Leone Brand, MD  rivaroxaban Alveda Reasons) 20 MG TABS tablet TAKE 1 TABLET BY MOUTH EVERY [place] WITH SUPPER 03/09/16   Leone Brand, MD  traZODone (DESYREL) 100 MG tablet TAKE 1 TABLET BY MOUTH AT BEDTIME 06/27/16   Luiz Blare Y, DO   venlafaxine XR (EFFEXOR-XR) 150 MG 24 hr capsule TAKE ONE CAPSULE BY MOUTH EVERY [place] 01/20/16   Leone Brand, MD    Family History Family History  Problem Relation Age of Onset  . Cancer Mother   . Depression Sister     Social History Social History   Tobacco Use  . Smoking status: Current Every [place] Smoker    Packs/[place]: 1.00    Types: Cigarettes  . Smokeless tobacco: Never Used  . Tobacco comment: increased to 2ppd  Substance Use Topics  . Alcohol use: No    Alcohol/week: 0.0 standard drinks    Comment: former  . Drug use: Yes    Comment: Heroin     Allergies   Fentanyl and Methadone   Review of Systems Review of Systems  HENT: Positive for congestion.   Psychiatric/Behavioral: Positive for agitation.  All other systems reviewed and are negative.    Physical Exam Updated Vital Signs BP (!) 185/101 (BP Location: Right Arm)   Pulse 88   Temp 99.5 F (37.5 C) (Oral)   Resp (!) 26   Ht 5\' 11"  (1.803 m)   Wt 115.7 kg   SpO2 97%   BMI 35.57 kg/m   Physical Exam Vitals signs and nursing note reviewed.  Constitutional:      General: He is not in acute distress.    Appearance: Normal appearance. He is well-developed.  HENT:     Head: Normocephalic and atraumatic.     Right Ear: Hearing normal.     Left Ear: Hearing normal.     Nose: Nose normal.  Eyes:     Conjunctiva/sclera: Conjunctivae normal.     Pupils: Pupils are equal, round, and reactive to light.  Neck:     Musculoskeletal: Normal range of motion and neck supple.  Cardiovascular:     Rate and Rhythm: Regular rhythm.     Heart sounds: S1 normal and S2 normal. No murmur. No friction rub. No gallop.   Pulmonary:     Effort: Pulmonary effort is normal. No respiratory distress.     Breath sounds: Normal breath sounds.  Chest:     Chest wall: No tenderness.  Abdominal:     General: Bowel sounds are normal.     Palpations: Abdomen is soft.     Tenderness: There is no abdominal tenderness.  There is no guarding or rebound. Negative signs include Murphy's sign and McBurney's sign.     Hernia: No hernia is present.  Musculoskeletal: Normal range of motion.  Skin:    General: Skin is warm and dry.     Findings: No rash.  Neurological:     Mental Status: He  is alert and oriented to person, place, and time.     GCS: GCS eye subscore is 4. GCS verbal subscore is 5. GCS motor subscore is 6.     Cranial Nerves: No cranial nerve deficit.     Sensory: No sensory deficit.     Coordination: Coordination normal.  Psychiatric:        Speech: Speech normal.        Behavior: Behavior normal.        Thought Content: Thought content normal.      ED Treatments / Results  Labs (all labs ordered are listed, but only abnormal results are displayed) Labs Reviewed  SARS CORONAVIRUS 2 (HOSPITAL ORDER, [facility])  CBC WITH DIFFERENTIAL/PLATELET  BASIC METABOLIC PANEL    EKG EKG Interpretation  Date/Time:  Friday May 31 2019 04:56:25 EDT Ventricular Rate:  82 PR Interval:    QRS Duration: 74 QT Interval:  345 QTC Calculation: 403 R Axis:   0 Text Interpretation:  Sinus rhythm Normal ECG Confirmed by Orpah Greek [phone]) on 05/31/2019 4:57:47 AM   Radiology No results found.  Procedures Procedures (including critical care time)  Medications Ordered in ED Medications  cloNIDine (CATAPRES) tablet 0.2 mg (has no administration in time range)  sodium chloride 0.9 % bolus 500 mL (has no administration in time range)  ondansetron (ZOFRAN) injection 4 mg (has no administration in time range)     Initial Impression / Assessment and Plan / ED Course  I have reviewed the triage vital signs and the nursing notes.  Pertinent labs & imaging results that were available during my care of the patient were reviewed by me and considered in my medical decision making (see chart for details).        Patient presents to the emergency department  after snorting heroin and then taking Suboxone.  Patient agitated and uncomfortable at arrival.  Patient's presentation is consistent with precipitated withdrawal secondary to the Suboxone use.  Patient complaining of pain all over.  Patient with shivering and piloerection, dairrhea with fecal incontinence and nausea, all c/w withdrawal.  He did have a low-grade fever at arrival (100.6) which could also be due to withdrawal, but will obtain labs and CXR.  He initially complained of shortness of breath, but I suspect that this was just tachypnea from his withdrawal symptoms.  Treat empirically for withdrawal and monitor.  Will sign out to oncoming ER physician to follow.  Final Clinical Impressions(s) / ED Diagnoses   Final diagnoses:  Heroin use  Heroin withdrawal [facility])    ED Discharge Orders    None       Orpah Greek, MD 05/31/19 0940    Orpah Greek, MD 05/31/19 [phone]

## 2019-05-31 NOTE — ED Notes (Addendum)
Patient states he has no ride until 6 pm tonight. Refusing to cooperate during D/C instructions. Pt started performing jerking movements with arm and legs once I stated he was going to be discharged. Security notified. AC notified.

## 2019-05-31 NOTE — Discharge Instructions (Addendum)
Stop using drugs.   Follow up with your md

## 2019-05-31 NOTE — ED Notes (Signed)
Pt finishing fluids. Will attempt to d/c when complete.

## 2019-05-31 NOTE — ED Notes (Signed)
Pt refusing to keep EKG leads on, pt continues to thrash in bed complaining about hurting "all over".

## 2019-05-31 NOTE — ED Triage Notes (Addendum)
Pt brought in by [facility] EMS after ingestion of heroin and suboxone. States he had his girlfriend call EMS because he felt like he couldn't breathe and he "felt funny". When asked to describe what "feeling funny" meant, pt stated he couldn't feel his arms and legs.

## 2019-07-10 ENCOUNTER — Other Ambulatory Visit: Payer: Medicaid Other

## 2019-12-17 ENCOUNTER — Encounter (HOSPITAL_COMMUNITY): Payer: Self-pay | Admitting: Emergency Medicine

## 2019-12-17 ENCOUNTER — Other Ambulatory Visit: Payer: Self-pay

## 2019-12-17 ENCOUNTER — Emergency Department (HOSPITAL_COMMUNITY)
Admission: EM | Admit: 2019-12-17 | Discharge: 2019-12-17 | Disposition: A | Discharge status: Home / Self Care | Payer: Medicaid Other | Attending: Emergency Medicine | Admitting: Emergency Medicine

## 2019-12-17 DIAGNOSIS — H9221 Otorrhagia, right ear: Secondary | ICD-10-CM | POA: Diagnosis not present

## 2019-12-17 DIAGNOSIS — Z5321 Procedure and treatment not carried out due to patient leaving prior to being seen by health care provider: Secondary | ICD-10-CM | POA: Diagnosis not present

## 2019-12-17 NOTE — ED Triage Notes (Addendum)
Pt reports woke up this am and had "blood running down the side of my ear/neck." pt denies any known injury and reports right ear pain and intermittent headache. Pt denies any dizziness,vision changes.

## 2019-12-17 NOTE — Progress Notes (Signed)
Patient called no answer.

## 2020-02-04 ENCOUNTER — Other Ambulatory Visit: Payer: Self-pay

## 2020-02-04 ENCOUNTER — Emergency Department (HOSPITAL_COMMUNITY)
Admission: EM | Admit: 2020-02-04 | Discharge: 2020-02-04 | Discharge status: Against Medical Advice | Payer: Medicaid Other | Attending: Emergency Medicine | Admitting: Emergency Medicine

## 2020-02-04 DIAGNOSIS — Z5321 Procedure and treatment not carried out due to patient leaving prior to being seen by health care provider: Secondary | ICD-10-CM | POA: Diagnosis not present

## 2020-02-04 DIAGNOSIS — R197 Diarrhea, unspecified: Secondary | ICD-10-CM | POA: Insufficient documentation

## 2020-02-04 DIAGNOSIS — Z20822 Contact with and (suspected) exposure to covid-19: Secondary | ICD-10-CM | POA: Diagnosis not present

## 2020-02-04 DIAGNOSIS — W19XXXA Unspecified fall, initial encounter: Secondary | ICD-10-CM

## 2020-02-04 LAB — COMPREHENSIVE METABOLIC PANEL
ALT: 18 U/L (ref 0–44)
AST: 23 U/L (ref 15–41)
Albumin: 3.8 g/dL (ref 3.5–5.0)
Alkaline Phosphatase: 94 U/L (ref 38–126)
Anion gap: 11 (ref 5–15)
BUN: 7 mg/dL (ref 6–20)
CO2: 25 mmol/L (ref 22–32)
Calcium: 8.8 mg/dL — ABNORMAL LOW (ref 8.9–10.3)
Chloride: 102 mmol/L (ref 98–111)
Creatinine, Ser: 0.79 mg/dL (ref 0.61–1.24)
GFR calc Af Amer: >60 mL/min (ref >60)
GFR calc non Af Amer: >60 mL/min (ref >60)
Glucose, Bld: 110 mg/dL — ABNORMAL HIGH (ref 70–99)
Potassium: 3.6 mmol/L (ref 3.5–5.1)
Sodium: 138 mmol/L (ref 135–145)
Total Bilirubin: 0.7 mg/dL (ref 0.3–1.2)
Total Protein: 7 g/dL (ref 6.5–8.1)

## 2020-02-04 LAB — CBC
HCT: 47.2 % (ref 39.0–52.0)
Hemoglobin: 15.6 g/dL (ref 13.0–17.0)
MCH: 32.8 pg (ref 26.0–34.0)
MCHC: 33.1 g/dL (ref 30.0–36.0)
MCV: 99.2 fL (ref 80.0–100.0)
Platelets: 252 10*3/uL (ref 150–400)
RBC: 4.76 MIL/uL (ref 4.22–5.81)
RDW: 13.5 % (ref 11.5–15.5)
WBC: 8.5 10*3/uL (ref 4.0–10.5)
nRBC: 0 % (ref 0.0–0.2)

## 2020-02-04 LAB — POC SARS CORONAVIRUS 2 AG -  ED: SARS Coronavirus 2 Ag: NEGATIVE

## 2020-02-04 LAB — LIPASE, BLOOD: Lipase: 19 U/L (ref 11–51)

## 2020-02-04 MED ORDER — SODIUM CHLORIDE 0.9% FLUSH: 3.0000 mL | Freq: Once | INTRAVENOUS | Status: DC

## 2020-02-04 NOTE — ED Notes (Signed)
Pt stated "i'm going home I don't want to wait anymore". LWBS.

## 2020-02-04 NOTE — ED Triage Notes (Signed)
Pt here for evaluation of six days of nasal congestion, chills, lost of taste, abdominal pain, and diarrhea. Denies sick contacts. Pt walks with a walker and his ankle turned the wrong way six days ago as he was going down the stairs. Swelling and pain to site. Ambulatory.

## 2020-04-18 ENCOUNTER — Telehealth: Payer: Self-pay

## 2020-04-18 ENCOUNTER — Ambulatory Visit
Admission: EM | Admit: 2020-04-18 | Discharge: 2020-04-18 | Disposition: A | Discharge status: Home / Self Care | Payer: Medicaid Other | Attending: Emergency Medicine | Admitting: Emergency Medicine

## 2020-04-18 ENCOUNTER — Other Ambulatory Visit: Payer: Self-pay

## 2020-04-18 DIAGNOSIS — J340 Abscess, furuncle and carbuncle of nose: Secondary | ICD-10-CM

## 2020-04-18 MED ORDER — MUPIROCIN 2 % EX OINT: 1.0000 "application " | TOPICAL_OINTMENT | Freq: Two times a day (BID) | CUTANEOUS | 1 refills | Status: DC

## 2020-04-18 MED ORDER — DOXYCYCLINE HYCLATE 100 MG PO CAPS: 100.0000 mg | ORAL_CAPSULE | Freq: Two times a day (BID) | ORAL | 0 refills | Status: DC

## 2020-04-18 NOTE — Discharge Instructions (Signed)
Apply warm compresses 3-4x daily for 10-15 minutes Wash site daily with warm water and mild soap Avoid picking Take antibiotic as prescribed and to completion Bactroban ointment prescribed Follow up here or with PCP if symptoms persists Return or go to the ED if you have any new or worsening symptoms increased redness, swelling, pain, nausea, vomiting, fever, chills, etc..Marland Kitchen

## 2020-04-18 NOTE — ED Triage Notes (Signed)
Provider triage  

## 2020-04-18 NOTE — ED Provider Notes (Signed)
[place]   [id number] 04/18/20 Arrival Time: 1003   [id number]  SUBJECTIVE:  [name] [name] is a 59 y.o. male who presents with a possible abscess of his RT nostril x 1 week. Symptoms began after plucking an ingrown hair in nostril.  Since then has had pain, and purulent drainage.  Has tried cleaning with saline solution with minimal improvement.  Tender to the touch.  Denies similar symptoms.  Denies fever, chills, nausea, vomiting.    ROS: As per HPI.  All other pertinent ROS negative.     Past Medical History:  Diagnosis Date  . Anxiety   . Constipation due to pain medication   . COPD (chronic obstructive pulmonary disease) ([place])   . CVA (cerebral infarction) 09/18/2015  . Hypertension   . Migraine   . 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 PE (pulmonary embolism)    DVT left leg  . Status post spinal surgery   . Stroke ([place])   . Tobacco abuse    Past Surgical History:  Procedure Laterality Date  . SPINAL FIXATION SURGERY     cervical   Allergies  Allergen Reactions  . Fentanyl     Per pt he had a near overdose on these due to "regulating body temp"  . Methadone Itching   No current facility-administered medications on file prior to encounter.   Current Outpatient Medications on File Prior to Encounter  Medication Sig Dispense Refill  . albuterol (PROVENTIL HFA;VENTOLIN HFA) 108 (90 BASE) MCG/ACT inhaler Inhale 2 puffs into the lungs every 4 (four) hours as needed for wheezing. Reported on 04/11/2016    . amLODipine (NORVASC) 10 MG tablet Take 1 tablet (10 mg total) by mouth daily. 90 tablet 3  . atorvastatin (LIPITOR) 80 MG tablet TAKE 1 TABLET (80 MG TOTAL) BY MOUTH DAILY AT 6 PM. 90 tablet 1  . busPIRone (BUSPAR) 10 MG tablet Take 1 tablet (10 mg total) by mouth 2 (two) times daily. Start after finishing 7.5mg  tablets 60 tablet 0  . busPIRone (BUSPAR) 7.5 MG tablet Take 1 tablet (7.5 mg total) by mouth 2 (two) times daily. For 14  days, then increase to 10mg  twice daily 28 tablet 0  . finasteride (PROSCAR) 5 MG tablet Take 1 tablet (5 mg total) by mouth daily. 30 tablet 5  . ibuprofen (ADVIL) 800 MG tablet Take 1 tablet (800 mg total) by mouth every 8 (eight) hours as needed. 21 tablet 0  . lisinopril (PRINIVIL,ZESTRIL) 40 MG tablet Take 1 tablet (40 mg total) by mouth daily. 90 tablet 3  . LORazepam (ATIVAN) 1 MG tablet Take 1 tablet (1 mg total) by mouth 2 (two) times daily as needed. 60 tablet 0  . ondansetron (ZOFRAN ODT) 4 MG disintegrating tablet 4mg  ODT q4 hours prn nausea/vomit 12 tablet 0  . oxyCODONE-acetaminophen (PERCOCET) 10-325 MG tablet Take 1 tablet by mouth every 4 (four) hours as needed for pain. Refill after 05/20/16 180 tablet 0  . pregabalin (LYRICA) 300 MG capsule Take 1 capsule (300 mg total) by mouth 2 (two) times daily. 60 capsule 1  . propranolol (INDERAL) 20 MG tablet Take 1 tablet (20 mg total) by mouth 3 (three) times daily. (Patient not taking: Reported on 04/11/2016) 90 tablet 1  . rivaroxaban (XARELTO) 20 MG TABS tablet TAKE 1 TABLET BY MOUTH EVERY [place] WITH SUPPER 30 tablet 2  . traZODone (DESYREL) 100 MG tablet TAKE 1 TABLET BY MOUTH AT BEDTIME 90 tablet 1  .  venlafaxine XR (EFFEXOR-XR) 150 MG 24 hr capsule TAKE ONE CAPSULE BY MOUTH EVERY [place] 30 capsule 4   Social History   Socioeconomic History  . Marital status: Divorced    Spouse name: Not on file  . Number of children: Not on file  . Years of education: Not on file  . Highest education level: Not on file  Occupational History  . Not on file  Tobacco Use  . Smoking status: Current Every [place] Smoker    Packs/[place]: 1.00    Types: Cigarettes  . Smokeless tobacco: Never Used  . Tobacco comment: increased to 2ppd  Substance and Sexual Activity  . Alcohol use: No    Alcohol/week: 0.0 standard drinks    Comment: former  . Drug use: Yes    Comment: Heroin  . Sexual activity: Yes  Other Topics Concern  . Not on file  Social History  Narrative  . Not on file   Social Determinants of Health   Financial Resource Strain:   . Difficulty of Paying Living Expenses:   Food Insecurity:   . Worried About Charity fundraiser in the Last Year:   . Arboriculturist in the Last Year:   Transportation Needs:   . Film/video editor (Medical):   Marland Kitchen Lack of Transportation (Non-Medical):   Physical Activity:   . Days of Exercise per Week:   . Minutes of Exercise per Session:   Stress:   . Feeling of Stress :   Social Connections:   . Frequency of Communication with Friends and Family:   . Frequency of Social Gatherings with Friends and Family:   . Attends Religious Services:   . Active Member of Clubs or Organizations:   . Attends Archivist Meetings:   Marland Kitchen Marital Status:   Intimate Partner Violence:   . Fear of Current or Ex-Partner:   . Emotionally Abused:   Marland Kitchen Physically Abused:   . Sexually Abused:    Family History  Problem Relation Age of Onset  . Cancer Mother   . Depression Sister     OBJECTIVE:  Vitals:   04/18/20 1013  BP: (!) 199/85  Pulse: 68  Resp: 18  Temp: 98.1 F (36.7 C)  TempSrc: Oral  SpO2: 95%     General appearance: alert; no distress Head: NCAT ENT: EACs clear, TMs pearly gray; nostrils patent; oropharynx clear Lungs: Normal respiratory effort Skin: 0.5 abscess to RT medial nostril; tender to touch; yellow active drainage Psychological: alert and cooperative; normal mood and affect  ASSESSMENT & PLAN:  1. Abscess of nose     Meds ordered this encounter  Medications  . doxycycline (VIBRAMYCIN) 100 MG capsule    Sig: Take 1 capsule (100 mg total) by mouth 2 (two) times daily.    Dispense:  20 capsule    Refill:  0    Order Specific Question:   Supervising Provider    Answer:   Raylene Everts [[id number]]  . mupirocin ointment (BACTROBAN) 2 %    Sig: Apply 1 application topically 2 (two) times daily.    Dispense:  30 g    Refill:  1    Order Specific Question:    Supervising Provider    Answer:   Raylene Everts [[id number]]    Apply warm compresses 3-4x daily for 10-15 minutes Wash site daily with warm water and mild soap Avoid picking Take antibiotic as prescribed and to completion Bactroban ointment prescribed Follow up here or  with PCP if symptoms persists Return or go to the ED if you have any new or worsening symptoms increased redness, swelling, pain, nausea, vomiting, fever, chills, etc...   Reviewed expectations re: course of current medical issues. Questions answered. Outlined signs and symptoms indicating need for more acute intervention. Patient verbalized understanding. After Visit Summary given.          Lestine Box, PA-C 04/18/20 1043

## 2020-06-01 ENCOUNTER — Encounter (HOSPITAL_COMMUNITY): Payer: Self-pay | Admitting: *Deleted

## 2020-06-01 ENCOUNTER — Other Ambulatory Visit: Payer: Self-pay

## 2020-06-01 ENCOUNTER — Emergency Department (HOSPITAL_COMMUNITY)
Admission: EM | Admit: 2020-06-01 | Discharge: 2020-06-01 | Disposition: A | Discharge status: Home / Self Care | Payer: Medicaid Other | Attending: Emergency Medicine | Admitting: Emergency Medicine

## 2020-06-01 DIAGNOSIS — R109 Unspecified abdominal pain: Secondary | ICD-10-CM | POA: Diagnosis present

## 2020-06-01 DIAGNOSIS — Z5321 Procedure and treatment not carried out due to patient leaving prior to being seen by health care provider: Secondary | ICD-10-CM | POA: Insufficient documentation

## 2020-06-01 NOTE — ED Triage Notes (Signed)
Pt c/o left side abdominal pain x 3 days; pt states he has had a BM today

## 2020-06-02 ENCOUNTER — Encounter (HOSPITAL_COMMUNITY): Payer: Self-pay

## 2020-06-02 ENCOUNTER — Emergency Department (HOSPITAL_COMMUNITY): Payer: Medicaid Other

## 2020-06-02 ENCOUNTER — Other Ambulatory Visit: Payer: Self-pay

## 2020-06-02 ENCOUNTER — Emergency Department (HOSPITAL_COMMUNITY)
Admission: EM | Admit: 2020-06-02 | Discharge: 2020-06-02 | Discharge status: Against Medical Advice | Payer: Medicaid Other | Attending: Emergency Medicine | Admitting: Emergency Medicine

## 2020-06-02 DIAGNOSIS — Z7951 Long term (current) use of inhaled steroids: Secondary | ICD-10-CM | POA: Insufficient documentation

## 2020-06-02 DIAGNOSIS — R109 Unspecified abdominal pain: Secondary | ICD-10-CM | POA: Diagnosis present

## 2020-06-02 DIAGNOSIS — M549 Dorsalgia, unspecified: Secondary | ICD-10-CM | POA: Insufficient documentation

## 2020-06-02 DIAGNOSIS — J449 Chronic obstructive pulmonary disease, unspecified: Secondary | ICD-10-CM | POA: Diagnosis not present

## 2020-06-02 DIAGNOSIS — Z79899 Other long term (current) drug therapy: Secondary | ICD-10-CM | POA: Insufficient documentation

## 2020-06-02 DIAGNOSIS — F17210 Nicotine dependence, cigarettes, uncomplicated: Secondary | ICD-10-CM | POA: Diagnosis not present

## 2020-06-02 DIAGNOSIS — I10 Essential (primary) hypertension: Secondary | ICD-10-CM | POA: Diagnosis not present

## 2020-06-02 LAB — CBC
HCT: 47.5 % (ref 39.0–52.0)
Hemoglobin: 15.5 g/dL (ref 13.0–17.0)
MCH: 32.4 pg (ref 26.0–34.0)
MCHC: 32.6 g/dL (ref 30.0–36.0)
MCV: 99.4 fL (ref 80.0–100.0)
Platelets: 202 10*3/uL (ref 150–400)
RBC: 4.78 MIL/uL (ref 4.22–5.81)
RDW: 13.3 % (ref 11.5–15.5)
WBC: 6.7 10*3/uL (ref 4.0–10.5)
nRBC: 0 % (ref 0.0–0.2)

## 2020-06-02 LAB — URINALYSIS, ROUTINE W REFLEX MICROSCOPIC
Bilirubin Urine: NEGATIVE
Glucose, UA: NEGATIVE mg/dL
Hgb urine dipstick: NEGATIVE
Ketones, ur: NEGATIVE mg/dL
Leukocytes,Ua: NEGATIVE
Nitrite: NEGATIVE
Protein, ur: NEGATIVE mg/dL
Specific Gravity, Urine: 1.031 — ABNORMAL HIGH (ref 1.005–1.030)
pH: 5 (ref 5.0–8.0)

## 2020-06-02 LAB — COMPREHENSIVE METABOLIC PANEL
ALT: 13 U/L (ref 0–44)
AST: 13 U/L — ABNORMAL LOW (ref 15–41)
Albumin: 3.6 g/dL (ref 3.5–5.0)
Alkaline Phosphatase: 91 U/L (ref 38–126)
Anion gap: 11 (ref 5–15)
BUN: 14 mg/dL (ref 6–20)
CO2: 23 mmol/L (ref 22–32)
Calcium: 8.9 mg/dL (ref 8.9–10.3)
Chloride: 105 mmol/L (ref 98–111)
Creatinine, Ser: 0.87 mg/dL (ref 0.61–1.24)
GFR calc Af Amer: >60 mL/min (ref >60)
GFR calc non Af Amer: >60 mL/min (ref >60)
Glucose, Bld: 138 mg/dL — ABNORMAL HIGH (ref 70–99)
Potassium: 3.4 mmol/L — ABNORMAL LOW (ref 3.5–5.1)
Sodium: 139 mmol/L (ref 135–145)
Total Bilirubin: 0.8 mg/dL (ref 0.3–1.2)
Total Protein: 6.6 g/dL (ref 6.5–8.1)

## 2020-06-02 LAB — LIPASE, BLOOD: Lipase: 25 U/L (ref 11–51)

## 2020-06-02 NOTE — ED Triage Notes (Signed)
Pt reports left sided abd pain that radiates to his back and up to his ribs. Nausea but no vomiting. Denies diarrhea. Pt seen at [facility] yesterday but LWBS.

## 2020-06-02 NOTE — ED Provider Notes (Signed)
[facility] EMERGENCY DEPARTMENT Provider Note   CSN: [id number] Arrival date & time: 06/02/20  1221     History Chief Complaint  Patient presents with   Abdominal Pain   Back Pain    [name] [name] is a 59 y.o. male.  59 year old male with prior medical history detailed below presents for evaluation of left-sided abdominal discomfort.  Patient reports intermittent left-sided abdominal discomfort for the last 4 to 5 days.  Patient reports this discomfort is most acute in the early morning between 1 and 4 AM.  He denies significant discomfort at this time.  Patient's pain is not associated with fever, nausea, vomiting, chest pain, shortness of breath, or other specific complaint.  Patient was concerned that he may be constipated and tried laxatives for treatment.  Symptoms did not significantly improve.  He denies significant bowel movement change.  He denies urinary symptoms.  The history is provided by the patient.  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   onset 02/14/14   PE (pulmonary embolism)    DVT left leg   Status post spinal surgery    Stroke [facility])    Tobacco abuse     Patient Active Problem List   Diagnosis Date Noted   Headache 01/25/2016   Tobacco use disorder    Fall    Aphasia due to recent stroke    Stroke ([place]) 09/18/2015   Right sided weakness 09/18/2015   Right knee  pain 07/21/2015   Wound of skin 06/03/2015   Chronic pain syndrome 05/28/2014   Anxiety state 05/28/2014   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   Depression 03/24/2014   Low back pain 02/16/2014   Left foot drop 02/14/2014   HTN (hypertension) 11/15/2011   Hyperlipidemia 11/15/2011   COPD (chronic obstructive pulmonary disease) ([place]) 11/15/2011   Tobacco abuse 11/15/2011    Past Surgical History:  Procedure Laterality Date   SPINAL FIXATION SURGERY     cervical       Family History  Problem Relation Age of Onset   Cancer Mother    Depression Sister     Social History   Tobacco Use   Smoking status: Current Every [place] Smoker    Packs/[place]: 1.00    Types: Cigarettes   Smokeless tobacco: Never Used   Tobacco comment: increased to 2ppd  Substance Use Topics   Alcohol use: No    Alcohol/week: 0.0 standard drinks    Comment: former   Drug use: Yes    Comment: Heroin    Home Medications Prior to Admission medications   Medication Sig Start Date End Date Taking? Authorizing Provider  albuterol (PROVENTIL HFA;VENTOLIN HFA) 108 (90 BASE) MCG/ACT inhaler Inhale 2 puffs into the lungs every 4 (four) hours as needed for wheezing. Reported on 04/11/2016    [provider]  amLODipine (NORVASC) 10 MG tablet Take 1 tablet (  10 mg total) by mouth daily. 01/21/16   Leone Brand, MD  atorvastatin (LIPITOR) 80 MG tablet TAKE 1 TABLET (80 MG TOTAL) BY MOUTH DAILY AT 6 PM. 03/09/16   Leone Brand, MD  busPIRone (BUSPAR) 10 MG tablet Take 1 tablet (10 mg total) by mouth 2 (two) times daily. Start after finishing 7.5mg  tablets 04/11/16   Leone Brand, MD  busPIRone (BUSPAR) 7.5 MG tablet Take 1 tablet (7.5 mg total) by mouth 2 (two) times daily. For 14 days, then increase to 10mg  twice daily 04/11/16   Leone Brand, MD  doxycycline (VIBRAMYCIN) 100 MG capsule Take 1 capsule (100 mg total) by mouth 2 (two) times daily. 04/18/20   Wurst, [place], PA-C    finasteride (PROSCAR) 5 MG tablet Take 1 tablet (5 mg total) by mouth daily. 03/09/16   Leone Brand, MD  ibuprofen (ADVIL) 800 MG tablet Take 1 tablet (800 mg total) by mouth every 8 (eight) hours as needed. 05/31/19   Milton Ferguson, MD  lisinopril (PRINIVIL,ZESTRIL) 40 MG tablet Take 1 tablet (40 mg total) by mouth daily. 03/09/16   Leone Brand, MD  LORazepam (ATIVAN) 1 MG tablet Take 1 tablet (1 mg total) by mouth 2 (two) times daily as needed. 06/27/16   Katheren Shams, DO  mupirocin ointment (BACTROBAN) 2 % Apply 1 application topically 2 (two) times daily. 04/18/20   Wurst, [place], PA-C  ondansetron (ZOFRAN ODT) 4 MG disintegrating tablet 4mg  ODT q4 hours prn nausea/vomit 05/31/19   Milton Ferguson, MD  oxyCODONE-acetaminophen (PERCOCET) 10-325 MG tablet Take 1 tablet by mouth every 4 (four) hours as needed for pain. Refill after 05/20/16 05/30/16   Katheren Shams, DO  pregabalin (LYRICA) 300 MG capsule Take 1 capsule (300 mg total) by mouth 2 (two) times daily. 05/30/16   Katheren Shams, DO  propranolol (INDERAL) 20 MG tablet Take 1 tablet (20 mg total) by mouth 3 (three) times daily. Patient not taking: Reported on 04/11/2016 03/21/16   Leone Brand, MD  rivaroxaban Alveda Reasons) 20 MG TABS tablet TAKE 1 TABLET BY MOUTH EVERY [place] WITH SUPPER 03/09/16   Leone Brand, MD  traZODone (DESYREL) 100 MG tablet TAKE 1 TABLET BY MOUTH AT BEDTIME 06/27/16   Luiz Blare Y, DO  venlafaxine XR (EFFEXOR-XR) 150 MG 24 hr capsule TAKE ONE CAPSULE BY MOUTH EVERY [place] 01/20/16   Leone Brand, MD    Allergies    Fentanyl and Methadone  Review of Systems   Review of Systems  Gastrointestinal: Positive for abdominal pain.  Musculoskeletal: Positive for back pain.  All other systems reviewed and are negative.   Physical Exam Updated Vital Signs BP 121/70 (BP Location: Left Arm)    Pulse 71    Temp 98.9 F (37.2 C) (Oral)    Resp 16    Ht 5\' 11"  (1.803 m)    Wt (!) 113.4 kg    SpO2 97%    BMI 34.87  kg/m   Physical Exam Vitals and nursing note reviewed.  Constitutional:      General: He is not in acute distress.    Appearance: He is well-developed.  HENT:     Head: Normocephalic and atraumatic.  Eyes:     Conjunctiva/sclera: Conjunctivae normal.     Pupils: Pupils are equal, round, and reactive to light.  Cardiovascular:     Rate and Rhythm: Normal rate and regular rhythm.     Heart sounds: Normal heart sounds.  Pulmonary:     Effort: Pulmonary effort is normal. No respiratory distress.     Breath sounds: Normal breath sounds.  Abdominal:     General: There is no distension.     Palpations: Abdomen is soft.     Tenderness: There is no abdominal tenderness.  Musculoskeletal:        General: No deformity. Normal range of motion.     Cervical back: Normal range of motion and neck supple.  Skin:    General: Skin is warm and dry.  Neurological:     Mental Status: He is alert and oriented to person, place, and time.     ED Results / Procedures / Treatments   Labs (all labs ordered are listed, but only abnormal results are displayed) Labs Reviewed  COMPREHENSIVE METABOLIC PANEL - Abnormal; Notable for the following components:      Result Value   Potassium 3.4 (*)    Glucose, Bld 138 (*)    AST 13 (*)    All other components within normal limits  URINALYSIS, ROUTINE W REFLEX MICROSCOPIC - Abnormal; Notable for the following components:   Color, Urine AMBER (*)    APPearance HAZY (*)    Specific Gravity, Urine 1.031 (*)    All other components within normal limits  LIPASE, BLOOD  CBC    EKG EKG Interpretation  Date/Time:  Tuesday June 02 2020 12:45:22 EDT Ventricular Rate:  64 PR Interval:  152 QRS Duration: 68 QT Interval:  416 QTC Calculation: 429 R Axis:   7 Text Interpretation: Normal sinus rhythm Right atrial enlargement Minimal voltage criteria for LVH, may be normal variant ( R in aVL ) ST & T wave abnormality, consider inferior ischemia Abnormal ECG  Confirmed by Dene Gentry [phone]) on 06/02/2020 4:10:25 PM   Radiology No results found.  Procedures Procedures (including critical care time)  Medications Ordered in ED Medications - No data to display  ED Course  I have reviewed the triage vital signs and the nursing notes.  Pertinent labs & imaging results that were available during my care of the patient were reviewed by me and considered in my medical decision making (see chart for details).    MDM Rules/Calculators/A&P                          MDM  Screen complete  Hugo Bible was evaluated in Emergency Department on 06/02/2020 for the symptoms described in the history of present illness. He was evaluated in the context of the global COVID-19 pandemic, which necessitated consideration that the patient might be at risk for infection with the SARS-CoV-2 virus that causes COVID-19. Institutional protocols and algorithms that pertain to the evaluation of patients at risk for COVID-19 are in a state of rapid change based on information released by regulatory bodies including the CDC and federal and state organizations. These policies and algorithms were followed during the patient's care in the ED.  Patient is presenting for evaluation of reported left-sided abdominal discomfort.  Workup was in progress.  CT abdomen pelvis was pending.  Patient apparently left the ED AMA during Epic downtime.  Staff notified this provider after the fact.   Final Clinical Impression(s) / ED Diagnoses Final diagnoses:  Abdominal pain, unspecified abdominal location    Rx / DC Orders ED Discharge Orders    None       Valarie Merino, MD 06/02/20 2324

## 2020-06-02 NOTE — ED Notes (Signed)
Called pt x3 for vital recheck no answer

## 2020-06-04 ENCOUNTER — Other Ambulatory Visit: Payer: Self-pay

## 2020-06-04 ENCOUNTER — Encounter (HOSPITAL_COMMUNITY): Payer: Self-pay

## 2020-06-04 ENCOUNTER — Emergency Department (HOSPITAL_COMMUNITY)
Admission: EM | Admit: 2020-06-04 | Discharge: 2020-06-04 | Disposition: A | Discharge status: Home / Self Care | Payer: Medicaid Other | Attending: Emergency Medicine | Admitting: Emergency Medicine

## 2020-06-04 DIAGNOSIS — I10 Essential (primary) hypertension: Secondary | ICD-10-CM | POA: Diagnosis not present

## 2020-06-04 DIAGNOSIS — B029 Zoster without complications: Secondary | ICD-10-CM

## 2020-06-04 DIAGNOSIS — F17210 Nicotine dependence, cigarettes, uncomplicated: Secondary | ICD-10-CM | POA: Insufficient documentation

## 2020-06-04 DIAGNOSIS — Z9889 Other specified postprocedural states: Secondary | ICD-10-CM | POA: Insufficient documentation

## 2020-06-04 DIAGNOSIS — R109 Unspecified abdominal pain: Secondary | ICD-10-CM | POA: Diagnosis present

## 2020-06-04 DIAGNOSIS — J449 Chronic obstructive pulmonary disease, unspecified: Secondary | ICD-10-CM | POA: Insufficient documentation

## 2020-06-04 DIAGNOSIS — Z79899 Other long term (current) drug therapy: Secondary | ICD-10-CM | POA: Diagnosis not present

## 2020-06-04 DIAGNOSIS — R21 Rash and other nonspecific skin eruption: Secondary | ICD-10-CM | POA: Insufficient documentation

## 2020-06-04 DIAGNOSIS — I639 Cerebral infarction, unspecified: Secondary | ICD-10-CM | POA: Diagnosis not present

## 2020-06-04 LAB — CBC
HCT: 44.2 % (ref 39.0–52.0)
Hemoglobin: 14.7 g/dL (ref 13.0–17.0)
MCH: 33.1 pg (ref 26.0–34.0)
MCHC: 33.3 g/dL (ref 30.0–36.0)
MCV: 99.5 fL (ref 80.0–100.0)
Platelets: 193 10*3/uL (ref 150–400)
RBC: 4.44 MIL/uL (ref 4.22–5.81)
RDW: 13.2 % (ref 11.5–15.5)
WBC: 6.7 10*3/uL (ref 4.0–10.5)
nRBC: 0 % (ref 0.0–0.2)

## 2020-06-04 LAB — URINALYSIS, ROUTINE W REFLEX MICROSCOPIC
Bilirubin Urine: NEGATIVE
Glucose, UA: NEGATIVE mg/dL
Hgb urine dipstick: NEGATIVE
Ketones, ur: NEGATIVE mg/dL
Leukocytes,Ua: NEGATIVE
Nitrite: NEGATIVE
Protein, ur: NEGATIVE mg/dL
Specific Gravity, Urine: 1.025 (ref 1.005–1.030)
pH: 5 (ref 5.0–8.0)

## 2020-06-04 LAB — COMPREHENSIVE METABOLIC PANEL
ALT: 15 U/L (ref 0–44)
AST: 17 U/L (ref 15–41)
Albumin: 3.6 g/dL (ref 3.5–5.0)
Alkaline Phosphatase: 97 U/L (ref 38–126)
Anion gap: 10 (ref 5–15)
BUN: 12 mg/dL (ref 6–20)
CO2: 23 mmol/L (ref 22–32)
Calcium: 8.6 mg/dL — ABNORMAL LOW (ref 8.9–10.3)
Chloride: 103 mmol/L (ref 98–111)
Creatinine, Ser: 0.79 mg/dL (ref 0.61–1.24)
GFR calc Af Amer: >60 mL/min (ref >60)
GFR calc non Af Amer: >60 mL/min (ref >60)
Glucose, Bld: 92 mg/dL (ref 70–99)
Potassium: 4 mmol/L (ref 3.5–5.1)
Sodium: 136 mmol/L (ref 135–145)
Total Bilirubin: 0.8 mg/dL (ref 0.3–1.2)
Total Protein: 6.5 g/dL (ref 6.5–8.1)

## 2020-06-04 LAB — LIPASE, BLOOD: Lipase: 22 U/L (ref 11–51)

## 2020-06-04 MED ORDER — VALACYCLOVIR HCL 1 G PO TABS
1000.0000 mg | ORAL_TABLET | Freq: Three times a day (TID) | ORAL | 0 refills | Status: AC
Start: 2020-06-04 — End: 2020-06-11

## 2020-06-04 MED ORDER — SODIUM CHLORIDE 0.9% FLUSH: 3.0000 mL | Freq: Once | INTRAVENOUS | Status: DC

## 2020-06-04 MED ORDER — PREDNISONE 20 MG PO TABS
60.0000 mg | ORAL_TABLET | Freq: Once | ORAL | Status: AC
  Administered 2020-06-04: 60 mg via ORAL
  Filled 2020-06-04: qty 3

## 2020-06-04 MED ORDER — PREDNISONE 20 MG PO TABS
ORAL_TABLET | ORAL | 0 refills | Status: DC
Start: 2020-06-04 — End: 2021-09-13

## 2020-06-04 MED ORDER — HYDROMORPHONE HCL 1 MG/ML IJ SOLN
1.0000 mg | Freq: Once | INTRAMUSCULAR | Status: AC
  Administered 2020-06-04: 1 mg via INTRAMUSCULAR
  Filled 2020-06-04: qty 1

## 2020-06-04 MED ORDER — VALACYCLOVIR HCL 500 MG PO TABS
1000.0000 mg | ORAL_TABLET | Freq: Once | ORAL | Status: AC
  Administered 2020-06-04: 1000 mg via ORAL
  Filled 2020-06-04: qty 2

## 2020-06-04 NOTE — Discharge Instructions (Addendum)
You have shingles and that is why you are hurting.  Take prednisone as prescribed.  Take Valtrex as prescribed.  Continue taking your Suboxone.  You may need extra doses but you need to get back from your pain management doctor.  Your pain management doctor may try other medicines to help your pain as well  You are expected to have worse rash and severe pain for several weeks.  Return to ER if you have worse abdominal pain, fever, signs of skin infection.

## 2020-06-04 NOTE — ED Triage Notes (Signed)
Patient presents to the ED with c/o L sided abd pain that started intermittently on 07/27. Patient reports diarrhea. Denies nausea, vomiting, hematuria. Patient appears in no acute distress, respirs even and unlabored.

## 2020-06-04 NOTE — ED Provider Notes (Signed)
[facility] EMERGENCY DEPARTMENT Provider Note   CSN: [id number] Arrival date & time: 06/04/20  1147     History Chief Complaint  Patient presents with  . Abdominal Pain    [name] [name] is a 59 y.o. male hx of COPD, CVA, PE, stroke, who presented with abdominal pain.  Patient has left-sided abdominal pain for the last 2 to 3 days.  He states that it was sharp and has no radiation to the pain. Patient came to the ED 2 days ago and CT abdomen pelvis was ordered but he had to leave to take care of family.  He states that since discharge, he noticed a rash in the left side of his abdomen.  He states that it is painful even to light touch.  He denies any associated vomiting or fevers.  Denies any diarrhea.  Patient is on Suboxone for chronic pain.  The history is provided by the patient.       Past Medical History:  Diagnosis Date  . Anxiety   . Constipation due to pain medication   . COPD (chronic obstructive pulmonary disease) ( [place])   . CVA (cerebral infarction) 09/18/2015  . Hypertension   . Migraine   . 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 PE (pulmonary embolism)    DVT left leg  . Status post spinal surgery   . Stroke ([place])   . Tobacco abuse     Patient Active Problem List   Diagnosis Date Noted  . Headache 01/25/2016  . Tobacco use disorder   . Fall   . Aphasia due to recent stroke   . Stroke ([place]) 09/18/2015  . Right sided weakness 09/18/2015  . Right knee pain 07/21/2015  . Wound of skin 06/03/2015  . Chronic pain syndrome 05/28/2014  . Anxiety state 05/28/2014  . 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 Depression 03/24/2014  . Low back pain 02/16/2014  . Left foot drop 02/14/2014  . HTN (hypertension) 11/15/2011  . Hyperlipidemia 11/15/2011  . COPD (chronic obstructive pulmonary disease) ([place]) 11/15/2011  . Tobacco abuse 11/15/2011    Past Surgical History:  Procedure Laterality Date  . SPINAL FIXATION  SURGERY     cervical       Family History  Problem Relation Age of Onset  . Cancer Mother   . Depression Sister     Social History   Tobacco Use  . Smoking status: Current Every [place] Smoker    Packs/[place]: 1.00    Types: Cigarettes  . Smokeless tobacco: Never Used  . Tobacco comment: increased to 2ppd  Substance Use Topics  . Alcohol use: No    Alcohol/week: 0.0 standard drinks    Comment: former  . Drug use: Yes    Comment: Heroin    Home Medications Prior to Admission medications   Medication Sig Start Date End Date Taking? Authorizing Provider  albuterol (PROVENTIL HFA;VENTOLIN HFA) 108 (90 BASE) MCG/ACT inhaler Inhale 2 puffs into the lungs every 4 (four) hours as needed for wheezing. Reported on 04/11/2016    [provider]  amLODipine (NORVASC) 10 MG tablet Take 1 tablet (10 mg total) by mouth daily. 01/21/16   Leone Brand, MD  atorvastatin (LIPITOR) 80 MG tablet TAKE 1 TABLET (80 MG TOTAL) BY MOUTH DAILY AT 6 PM. 03/09/16   Leone Brand, MD  busPIRone (BUSPAR) 10 MG tablet Take 1 tablet (10 mg total) by mouth 2 (two) times daily. Start after  finishing 7.5mg  tablets 04/11/16   Leone Brand, MD  busPIRone (BUSPAR) 7.5 MG tablet Take 1 tablet (7.5 mg total) by mouth 2 (two) times daily. For 14 days, then increase to 10mg  twice daily 04/11/16   Leone Brand, MD  doxycycline (VIBRAMYCIN) 100 MG capsule Take 1 capsule (100 mg total) by mouth 2 (two) times daily. 04/18/20   Wurst, [place], PA-C  finasteride (PROSCAR) 5 MG tablet Take 1 tablet (5 mg total) by mouth daily. 03/09/16   Leone Brand, MD  ibuprofen (ADVIL) 800 MG tablet Take 1 tablet (800 mg total) by mouth every 8 (eight) hours as needed. 05/31/19   Milton Ferguson, MD  lisinopril (PRINIVIL,ZESTRIL) 40 MG tablet Take 1 tablet (40 mg total) by mouth daily. 03/09/16   Leone Brand, MD  LORazepam (ATIVAN) 1 MG tablet Take 1 tablet (1 mg total) by mouth 2 (two) times daily as needed. 06/27/16   Katheren Shams, DO  mupirocin ointment (BACTROBAN) 2 % Apply 1 application topically 2 (two) times daily. 04/18/20   Wurst, [place], PA-C  ondansetron (ZOFRAN ODT) 4 MG disintegrating tablet 4mg  ODT q4 hours prn nausea/vomit 05/31/19   Milton Ferguson, MD  oxyCODONE-acetaminophen (PERCOCET) 10-325 MG tablet Take 1 tablet by mouth every 4 (four) hours as needed for pain. Refill after 05/20/16 05/30/16   Katheren Shams, DO  pregabalin (LYRICA) 300 MG capsule Take 1 capsule (300 mg total) by mouth 2 (two) times daily. 05/30/16   Katheren Shams, DO  propranolol (INDERAL) 20 MG tablet Take 1 tablet (20 mg total) by mouth 3 (three) times daily. Patient not taking: Reported on 04/11/2016 03/21/16   Leone Brand, MD  rivaroxaban Alveda Reasons) 20 MG TABS tablet TAKE 1 TABLET BY MOUTH EVERY [place] WITH SUPPER 03/09/16   Leone Brand, MD  traZODone (DESYREL) 100 MG tablet TAKE 1 TABLET BY MOUTH AT BEDTIME 06/27/16   Luiz Blare Y, DO  venlafaxine XR (EFFEXOR-XR) 150 MG 24 hr capsule TAKE ONE CAPSULE BY MOUTH EVERY [place] 01/20/16   Leone Brand, MD    Allergies    Amlodipine, Fentanyl, and Methadone  Review of Systems   Review of Systems  Gastrointestinal: Positive for abdominal pain.  Skin: Positive for rash.  All other systems reviewed and are negative.   Physical Exam Updated Vital Signs BP (!) 202/106 (BP Location: Left Arm)   Pulse 69   Temp 98.5 F (36.9 C) (Oral)   Resp 18   Ht 5\' 11"  (1.803 m)   Wt (!) 113.4 kg   SpO2 98%   BMI 34.87 kg/m   Physical Exam Vitals and nursing note reviewed.  Constitutional:      Comments: Uncomfortable   HENT:     Head: Normocephalic.     Mouth/Throat:     Mouth: Mucous membranes are moist.  Eyes:     Extraocular Movements: Extraocular movements intact.  Cardiovascular:     Rate and Rhythm: Normal rate and regular rhythm.     Heart sounds: Normal heart sounds.  Pulmonary:     Effort: Pulmonary effort is normal.     Breath sounds: Normal breath sounds.    Abdominal:     General: Abdomen is flat.     Palpations: Abdomen is soft.     Comments: Vesicular rash on the left side of the abdomen and also left side of the back.  There is some encrustation as well.  No superimposed cellulitis.   Skin:  General: Skin is warm.     Capillary Refill: Capillary refill takes less than 2 seconds.     Findings: Rash present.  Neurological:     General: No focal deficit present.     Mental Status: He is alert and oriented to person, place, and time.  Psychiatric:        Mood and Affect: Mood normal.        Behavior: Behavior normal.     ED Results / Procedures / Treatments   Labs (all labs ordered are listed, but only abnormal results are displayed) Labs Reviewed  COMPREHENSIVE METABOLIC PANEL - Abnormal; Notable for the following components:      Result Value   Calcium 8.6 (*)    All other components within normal limits  URINALYSIS, ROUTINE W REFLEX MICROSCOPIC - Abnormal; Notable for the following components:   APPearance HAZY (*)    All other components within normal limits  LIPASE, BLOOD  CBC    EKG None  Radiology No results found.  Procedures Procedures (including critical care time)  Medications Ordered in ED Medications  sodium chloride flush (NS) 0.9 % injection 3 mL (3 mLs Intravenous Not Given 06/04/20 1853)  valACYclovir (VALTREX) tablet 1,000 mg (has no administration in time range)  predniSONE (DELTASONE) tablet 60 mg (has no administration in time range)    ED Course  I have reviewed the triage vital signs and the nursing notes.  Pertinent labs & imaging results that were available during my care of the patient were reviewed by me and considered in my medical decision making (see chart for details).    MDM Rules/Calculators/A&P                          [name] [name] is a 59 y.o. male here presenting with left-sided abdominal pain. Patient had chickenpox previously as a child.  Patient appears to have shingles.   Patient's repeat lab work was unremarkable.  Patient was hypertensive but has a history of hypertension and was hypertensive in the ED several days ago.  Will discharge home with prednisone and Valtrex.  Patient is on Suboxone at home and I told him to talk to his pain medicine doctor regarding increasing his dose.  Stable for discharge.  Final Clinical Impression(s) / ED Diagnoses Final diagnoses:  None    Rx / DC Orders ED Discharge Orders    None       Drenda Freeze, MD 06/04/20 [phone]

## 2020-11-11 ENCOUNTER — Other Ambulatory Visit: Payer: Self-pay

## 2020-11-11 ENCOUNTER — Ambulatory Visit
Admission: EM | Admit: 2020-11-11 | Discharge: 2020-11-11 | Disposition: A | Discharge status: Home / Self Care | Payer: Medicaid Other | Attending: Emergency Medicine | Admitting: Emergency Medicine

## 2020-11-11 ENCOUNTER — Encounter: Payer: Self-pay | Admitting: Emergency Medicine

## 2020-11-11 DIAGNOSIS — L539 Erythematous condition, unspecified: Secondary | ICD-10-CM | POA: Diagnosis not present

## 2020-11-11 DIAGNOSIS — W540XXA Bitten by dog, initial encounter: Secondary | ICD-10-CM | POA: Diagnosis not present

## 2020-11-11 MED ORDER — AMOXICILLIN-POT CLAVULANATE 875-125 MG PO TABS
1.0000 | ORAL_TABLET | Freq: Two times a day (BID) | ORAL | 0 refills | Status: DC
Start: 2020-11-11 — End: 2021-07-06

## 2020-11-11 NOTE — ED Triage Notes (Signed)
Dog bite to right arm last night.

## 2020-11-11 NOTE — Discharge Instructions (Addendum)
Wash with warm water and mild soap Change dressing daily Augmentin prescribed.  Take as directed and to completion Follow up with PCP if symptoms persists Return or go to the ED if you have any new or worsening symptoms such as increasing redness, swelling, drainage, fever, chills, nausea, chest pain, SOB, etc... 

## 2020-11-11 NOTE — ED Provider Notes (Signed)
[place]   [id number] 11/11/20 Arrival Time: [id number]   Chief Complaint  Patient presents with  . Animal Bite    SUBJECTIVE: History from: patient.  [name] [name] is a 60 y.o. male who presented to the urgent care with a complaint of dog bite to right arm that occured last night. Denies any precipitating event.  States dog immunizations  is up-to-date.  He localizes the pain to the right arm.  He describes the pain as constant and achy.  He has tried OTC medications without relief.  His symptoms are made worse with ROM.  He denies similar symptoms in the past.  Denies chills, fever, nausea, vomiting, diarrhea  Td immunization is up-to-date  ROS: As per HPI.  All other pertinent ROS negative.     Past Medical History:  Diagnosis Date  . Anxiety   . Constipation due to pain medication   . COPD (chronic obstructive pulmonary disease) ([employer])   . CVA (cerebral infarction) 09/18/2015  . Hypertension   . Migraine   . 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 PE (pulmonary embolism)    DVT left leg  . Status post spinal surgery   . Stroke ([place])   . Tobacco abuse    Past Surgical History:  Procedure Laterality Date  . SPINAL FIXATION SURGERY     cervical   Allergies  Allergen Reactions  . Amlodipine Swelling    BLE peripheral edema   . Fentanyl     Per pt he had a near overdose on these due to "regulating body temp"  . Methadone Itching   No current facility-administered medications on file prior to encounter.   Current Outpatient Medications on File Prior to Encounter  Medication Sig Dispense Refill  . albuterol (PROVENTIL HFA;VENTOLIN HFA) 108 (90 BASE) MCG/ACT inhaler Inhale 2 puffs into the lungs every 4 (four) hours as needed for wheezing. Reported on 04/11/2016    . amLODipine (NORVASC) 10 MG tablet Take 1 tablet (10 mg total) by mouth daily. 90 tablet 3  . atorvastatin (LIPITOR) 80 MG tablet TAKE 1 TABLET (80 MG TOTAL) BY MOUTH DAILY AT 6 PM.  90 tablet 1  . busPIRone (BUSPAR) 10 MG tablet Take 1 tablet (10 mg total) by mouth 2 (two) times daily. Start after finishing 7.5mg  tablets 60 tablet 0  . busPIRone (BUSPAR) 7.5 MG tablet Take 1 tablet (7.5 mg total) by mouth 2 (two) times daily. For 14 days, then increase to 10mg  twice daily 28 tablet 0  . doxycycline (VIBRAMYCIN) 100 MG capsule Take 1 capsule (100 mg total) by mouth 2 (two) times daily. 20 capsule 0  . finasteride (PROSCAR) 5 MG tablet Take 1 tablet (5 mg total) by mouth daily. 30 tablet 5  . ibuprofen (ADVIL) 800 MG tablet Take 1 tablet (800 mg total) by mouth every 8 (eight) hours as needed. 21 tablet 0  . lisinopril (PRINIVIL,ZESTRIL) 40 MG tablet Take 1 tablet (40 mg total) by mouth daily. 90 tablet 3  . LORazepam (ATIVAN) 1 MG tablet Take 1 tablet (1 mg total) by mouth 2 (two) times daily as needed. 60 tablet 0  . mupirocin ointment (BACTROBAN) 2 % Apply 1 application topically 2 (two) times daily. 30 g 1  . ondansetron (ZOFRAN ODT) 4 MG disintegrating tablet 4mg  ODT q4 hours prn nausea/vomit 12 tablet 0  . oxyCODONE-acetaminophen (PERCOCET) 10-325 MG tablet Take 1 tablet by mouth every 4 (four) hours as needed for pain. Refill after  05/20/16 180 tablet 0  . predniSONE (DELTASONE) 20 MG tablet Take 60 mg daily x 2 days then 40 mg daily x 2 days then 20 mg daily x 2 days 12 tablet 0  . pregabalin (LYRICA) 300 MG capsule Take 1 capsule (300 mg total) by mouth 2 (two) times daily. 60 capsule 1  . propranolol (INDERAL) 20 MG tablet Take 1 tablet (20 mg total) by mouth 3 (three) times daily. (Patient not taking: Reported on 04/11/2016) 90 tablet 1  . rivaroxaban (XARELTO) 20 MG TABS tablet TAKE 1 TABLET BY MOUTH EVERY [place] WITH SUPPER 30 tablet 2  . traZODone (DESYREL) 100 MG tablet TAKE 1 TABLET BY MOUTH AT BEDTIME 90 tablet 1  . venlafaxine XR (EFFEXOR-XR) 150 MG 24 hr capsule TAKE ONE CAPSULE BY MOUTH EVERY [place] 30 capsule 4   Social History   Socioeconomic History  . Marital  status: Divorced    Spouse name: Not on file  . Number of children: Not on file  . Years of education: Not on file  . Highest education level: Not on file  Occupational History  . Not on file  Tobacco Use  . Smoking status: Current Every [place] Smoker    Packs/[place]: 1.00    Types: Cigarettes  . Smokeless tobacco: Never Used  . Tobacco comment: increased to 2ppd  Substance and Sexual Activity  . Alcohol use: No    Alcohol/week: 0.0 standard drinks    Comment: former  . Drug use: Yes    Comment: Heroin  . Sexual activity: Yes  Other Topics Concern  . Not on file  Social History Narrative  . Not on file   Social Determinants of Health   Financial Resource Strain: Not on file  Food Insecurity: Not on file  Transportation Needs: Not on file  Physical Activity: Not on file  Stress: Not on file  Social Connections: Not on file  Intimate Partner Violence: Not on file   Family History  Problem Relation Age of Onset  . Cancer Mother   . Depression Sister     OBJECTIVE:  Vitals:   11/11/20 1732 11/11/20 1735  BP:  (S) (!) 195/100  Pulse:  75  Resp:  (!) 22  Temp:  97.9 F (36.6 C)  TempSrc:  Oral  SpO2:  98%  Weight: 232 lb (105.2 kg)   Height: 5\' 11"  (1.803 m)      Physical Exam Vitals and nursing note reviewed.  Constitutional:      General: He is not in acute distress.    Appearance: Normal appearance. He is normal weight. He is not ill-appearing, toxic-appearing or diaphoretic.  Cardiovascular:     Rate and Rhythm: Normal rate and regular rhythm.     Pulses: Normal pulses.     Heart sounds: Normal heart sounds. No murmur heard. No friction rub. No gallop.   Pulmonary:     Effort: Pulmonary effort is normal. No respiratory distress.     Breath sounds: Normal breath sounds. No stridor. No wheezing, rhonchi or rales.  Chest:     Chest wall: No tenderness.  Skin:    Findings: Erythema present.  Neurological:     Mental Status: He is alert and oriented to  person, place, and time.     LABS:  No results found for this or any previous visit (from the past 24 hour(s)).   ASSESSMENT & PLAN:  1. Dog bite, initial encounter   2. Erythema     Meds ordered  this encounter  Medications  . amoxicillin-clavulanate (AUGMENTIN) 875-125 MG tablet    Sig: Take 1 tablet by mouth every 12 (twelve) hours.    Dispense:  14 tablet    Refill:  0    Discharge instructions  Wash with warm water and mild soap Change dressing daily Augmentin prescribed.  Take as directed and to completion Follow up with PCP if symptoms persists Return or go to the ED if you have any new or worsening symptoms such as increasing redness, swelling, drainage, fever, chills, nausea, chest pain, SOB, etc...   Reviewed expectations re: course of current medical issues. Questions answered. Outlined signs and symptoms indicating need for more acute intervention. Patient verbalized understanding. After Visit Summary given.         Emerson Monte, [place] 11/11/20 1812

## 2020-11-18 DIAGNOSIS — K5903 Drug induced constipation: Secondary | ICD-10-CM | POA: Diagnosis not present

## 2020-11-18 DIAGNOSIS — B029 Zoster without complications: Secondary | ICD-10-CM | POA: Diagnosis not present

## 2020-12-08 DIAGNOSIS — Z419 Encounter for procedure for purposes other than remedying health state, unspecified: Secondary | ICD-10-CM | POA: Diagnosis not present

## 2020-12-16 DIAGNOSIS — Z7689 Persons encountering health services in other specified circumstances: Secondary | ICD-10-CM | POA: Diagnosis not present

## 2020-12-16 DIAGNOSIS — B029 Zoster without complications: Secondary | ICD-10-CM | POA: Diagnosis not present

## 2020-12-16 DIAGNOSIS — K5903 Drug induced constipation: Secondary | ICD-10-CM | POA: Diagnosis not present

## 2021-01-05 DIAGNOSIS — Z419 Encounter for procedure for purposes other than remedying health state, unspecified: Secondary | ICD-10-CM | POA: Diagnosis not present

## 2021-01-13 DIAGNOSIS — B029 Zoster without complications: Secondary | ICD-10-CM | POA: Diagnosis not present

## 2021-01-13 DIAGNOSIS — Z7689 Persons encountering health services in other specified circumstances: Secondary | ICD-10-CM | POA: Diagnosis not present

## 2021-01-13 DIAGNOSIS — K5903 Drug induced constipation: Secondary | ICD-10-CM | POA: Diagnosis not present

## 2021-02-05 DIAGNOSIS — Z419 Encounter for procedure for purposes other than remedying health state, unspecified: Secondary | ICD-10-CM | POA: Diagnosis not present

## 2021-02-10 DIAGNOSIS — B029 Zoster without complications: Secondary | ICD-10-CM | POA: Diagnosis not present

## 2021-02-10 DIAGNOSIS — Z7689 Persons encountering health services in other specified circumstances: Secondary | ICD-10-CM | POA: Diagnosis not present

## 2021-02-10 DIAGNOSIS — K5903 Drug induced constipation: Secondary | ICD-10-CM | POA: Diagnosis not present

## 2021-03-07 DIAGNOSIS — Z419 Encounter for procedure for purposes other than remedying health state, unspecified: Secondary | ICD-10-CM | POA: Diagnosis not present

## 2021-03-10 DIAGNOSIS — K5903 Drug induced constipation: Secondary | ICD-10-CM | POA: Diagnosis not present

## 2021-03-10 DIAGNOSIS — B029 Zoster without complications: Secondary | ICD-10-CM | POA: Diagnosis not present

## 2021-03-10 DIAGNOSIS — Z7689 Persons encountering health services in other specified circumstances: Secondary | ICD-10-CM | POA: Diagnosis not present

## 2021-04-07 DIAGNOSIS — Z419 Encounter for procedure for purposes other than remedying health state, unspecified: Secondary | ICD-10-CM | POA: Diagnosis not present

## 2021-04-07 DIAGNOSIS — K5903 Drug induced constipation: Secondary | ICD-10-CM | POA: Diagnosis not present

## 2021-04-07 DIAGNOSIS — B029 Zoster without complications: Secondary | ICD-10-CM | POA: Diagnosis not present

## 2021-04-07 DIAGNOSIS — Z7689 Persons encountering health services in other specified circumstances: Secondary | ICD-10-CM | POA: Diagnosis not present

## 2021-05-05 DIAGNOSIS — Z7689 Persons encountering health services in other specified circumstances: Secondary | ICD-10-CM | POA: Diagnosis not present

## 2021-05-05 DIAGNOSIS — B029 Zoster without complications: Secondary | ICD-10-CM | POA: Diagnosis not present

## 2021-05-05 DIAGNOSIS — K5903 Drug induced constipation: Secondary | ICD-10-CM | POA: Diagnosis not present

## 2021-05-07 DIAGNOSIS — Z419 Encounter for procedure for purposes other than remedying health state, unspecified: Secondary | ICD-10-CM | POA: Diagnosis not present

## 2021-05-17 ENCOUNTER — Encounter: Payer: Self-pay | Admitting: Emergency Medicine

## 2021-05-17 ENCOUNTER — Ambulatory Visit: Admission: EM | Admit: 2021-05-17 | Discharge: 2021-05-17 | Payer: Medicaid Other

## 2021-05-17 DIAGNOSIS — R011 Cardiac murmur, unspecified: Secondary | ICD-10-CM

## 2021-05-17 DIAGNOSIS — I10 Essential (primary) hypertension: Secondary | ICD-10-CM | POA: Diagnosis not present

## 2021-05-17 DIAGNOSIS — R600 Localized edema: Secondary | ICD-10-CM

## 2021-05-17 DIAGNOSIS — R0602 Shortness of breath: Secondary | ICD-10-CM

## 2021-05-17 NOTE — Discharge Instructions (Addendum)
I am concerned today based on your clinical findings that you may be experiencing acute heart failure exacerbation.  Go to  [facility] emergency department immediately for further work-up and evaluation of your symptoms.

## 2021-05-17 NOTE — ED Triage Notes (Signed)
Redness and swelling in both lower legs with sores that are leaking fluid.

## 2021-05-17 NOTE — ED Provider Notes (Signed)
[facility]    CSN: [id number] Arrival date & time: 05/17/21  1049      History   Chief Complaint No chief complaint on file.   HPI [name] [name] is a 60 y.o. male.   HPI Patient with a history of COPD, stroke, hypertension and neuropathy and DVT chronic tobacco use presents today with bilateral lower extremity swelling, redness and oozing of leg wounds x 2 days. He always has swelling in the left leg however both legs have developed swelling within the last two days. He chronically takes Xarelto. He endorses worsening shortness of breath especially with activity. Patient is followed by [facility] for primary care and was referred to a cardiologist last year however patient reports that he failed to follow-up with the specialist.  He has had chronic shortness of breath and accelerated hypertension nonresponsive to into hypertensive therapy.  Patient is also taking Lasix and reports he is compliant with therapy.  He reports he is taking all of his medications today however his blood pressure is severely elevated x readings with 20 minutes of rest between readings 211/95. He is a daily smoker.  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   onset 02/14/14   PE (pulmonary embolism)    DVT left leg   Status post spinal surgery    Stroke [facility])    Tobacco abuse     Patient Active Problem List   Diagnosis Date Noted   Headache 01/25/2016   Tobacco use disorder    Fall    Aphasia due to recent stroke    Stroke ([place]) 09/18/2015   Right sided weakness 09/18/2015   Right knee pain 07/21/2015   Wound of skin 06/03/2015   Chronic pain syndrome 05/28/2014   Anxiety state 05/28/2014   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 Authorizing Provider  albuterol (PROVENTIL HFA;VENTOLIN HFA) 108 (90 BASE) MCG/ACT inhaler Inhale 2 puffs into the lungs every 4 (four) hours as needed for wheezing. Reported on 04/11/2016    [provider]  amLODipine (NORVASC) 10 MG tablet Take 1 tablet (10 mg total) by mouth daily. 01/21/16   Leone Brand, MD  amoxicillin-clavulanate (AUGMENTIN) 875-125 MG tablet Take 1 tablet by mouth every 12 (twelve) hours. 11/11/20   Emerson Monte, FNP  atorvastatin (LIPITOR) 80 MG tablet TAKE 1 TABLET (80 MG TOTAL) BY MOUTH DAILY AT 6 PM. 03/09/16   Leone Brand, MD  busPIRone (BUSPAR) 10 MG tablet Take 1 tablet (10 mg total) by mouth 2 (two) times daily. Start after finishing 7.5mg  tablets 04/11/16   Leone Brand, MD  busPIRone (BUSPAR) 7.5 MG tablet Take 1 tablet (7.5 mg total) by mouth 2 (two) times daily. For 14 days, then increase to 10mg  twice daily 04/11/16  Leone Brand, MD  doxycycline (VIBRAMYCIN) 100 MG capsule Take 1 capsule (100 mg total) by mouth 2 (two) times daily. 04/18/20   Wurst, [place], PA-C  finasteride (PROSCAR) 5 MG tablet Take 1 tablet (5 mg total) by mouth daily. 03/09/16   Leone Brand, MD  ibuprofen (ADVIL) 800 MG tablet Take 1 tablet (800 mg total) by mouth every 8 (eight) hours as needed. 05/31/19   Milton Ferguson, MD  lisinopril (PRINIVIL,ZESTRIL) 40 MG tablet Take 1 tablet (40 mg total) by mouth daily. 03/09/16   Leone Brand, MD  LORazepam (ATIVAN) 1 MG tablet Take 1 tablet (1 mg total) by mouth 2 (two) times daily as needed. 06/27/16   Katheren Shams, DO   mupirocin ointment (BACTROBAN) 2 % Apply 1 application topically 2 (two) times daily. 04/18/20   Wurst, [place], PA-C  ondansetron (ZOFRAN ODT) 4 MG disintegrating tablet 4mg  ODT q4 hours prn nausea/vomit 05/31/19   Milton Ferguson, MD  oxyCODONE-acetaminophen (PERCOCET) 10-325 MG tablet Take 1 tablet by mouth every 4 (four) hours as needed for pain. Refill after 05/20/16 05/30/16   Katheren Shams, DO  predniSONE (DELTASONE) 20 MG tablet Take 60 mg daily x 2 days then 40 mg daily x 2 days then 20 mg daily x 2 days 06/04/20   Drenda Freeze, MD  pregabalin (LYRICA) 300 MG capsule Take 1 capsule (300 mg total) by mouth 2 (two) times daily. 05/30/16   Katheren Shams, DO  propranolol (INDERAL) 20 MG tablet Take 1 tablet (20 mg total) by mouth 3 (three) times daily. Patient not taking: Reported on 04/11/2016 03/21/16   Leone Brand, MD  rivaroxaban Alveda Reasons) 20 MG TABS tablet TAKE 1 TABLET BY MOUTH EVERY [place] WITH SUPPER 03/09/16   Leone Brand, MD  traZODone (DESYREL) 100 MG tablet TAKE 1 TABLET BY MOUTH AT BEDTIME 06/27/16   Katheren Shams, DO  venlafaxine XR (EFFEXOR-XR) 150 MG 24 hr capsule TAKE ONE CAPSULE BY MOUTH EVERY [place] 01/20/16   Leone Brand, MD    Family History Family History  Problem Relation Age of Onset   Cancer Mother    Depression Sister     Social History Social History   Tobacco Use   Smoking status: Every [place]    Packs/[place]: 1.00    Pack years: 0.00    Types: Cigarettes   Smokeless tobacco: Never   Tobacco comments:    increased to 2ppd  Substance Use Topics   Alcohol use: No    Alcohol/week: 0.0 standard drinks    Comment: former   Drug use: Yes    Comment: Heroin     Allergies   Amlodipine, Fentanyl, and Methadone   Review of Systems Review of Systems Pertinent negatives listed in HPI   Physical Exam Triage Vital Signs ED Triage Vitals  Enc Vitals Group     BP 05/17/21 1254 (!) 200/96     Pulse Rate 05/17/21 1254 80     Resp 05/17/21 1254 18      Temp 05/17/21 1254 98.8 F (37.1 C)     Temp Source 05/17/21 1254 Oral     SpO2 05/17/21 1254 95 %     Weight --      Height --      Head Circumference --      Peak Flow --      Pain Score 05/17/21 1255 10     Pain Loc --      Pain Edu? --  Excl. in GC? --    No data found.  Updated Vital Signs BP (!) 211/95 (BP Location: Right Arm)   Pulse 80   Temp 98.8 F (37.1 C) (Oral)   Resp 18   SpO2 95%   Visual Acuity Right Eye Distance:   Left Eye Distance:   Bilateral Distance:    Right Eye Near:   Left Eye Near:    Bilateral Near:     Physical Exam Constitutional:      Appearance: He is obese. He is ill-appearing.  Cardiovascular:     Rate and Rhythm: Normal rate. Rhythm regularly irregular.     Pulses:          Dorsalis pedis pulses are 2+ on the right side and 2+ on the left side.     Heart sounds: Murmur heard.     Comments: Diffuse varicosities bilateral lower extremities Musculoskeletal:     Right lower leg: 3+ Edema present.     Left lower leg: 3+ Edema present.  Skin:    Comments: Multiple lower extremity wounds  Psychiatric:        Behavior: Behavior is cooperative.     UC Treatments / Results  Labs (all labs ordered are listed, but only abnormal results are displayed) Labs Reviewed - No data to display  EKG   Radiology No results found.  Procedures Procedures (including critical care time)  Medications Ordered in UC Medications - No data to display  Initial Impression / Assessment and Plan / UC Course  I have reviewed the triage vital signs and the nursing notes.  Pertinent labs & imaging results that were available during my care of the patient were reviewed by me and considered in my medical decision making (see chart for details).    Patient presents today with worsening of shortness of breath, bilateral lower extremity swelling with weeping of wounds along with a new audible harsh murmur present on exam.  Concern for possible  heart failure exacerbation. Patient was referred to cardiology over a year ago for further work-up suspected heart failure however did not follow-up to be seen.  Patient is being referred over to [facility] emergency department for further work-up and evaluation to rule out an acute heart failure exacerbation given patient's presentation today and blood pressure readings consistently greater than 200/90 and patient has taken his blood pressure medicines today. Final Clinical Impressions(s) / UC Diagnoses   Final diagnoses:  Accelerated hypertension  Grade 5 out of 6 intensity murmur  Heart murmur  Bilateral edema of lower extremity  SOBOE (shortness of breath on exertion)     Discharge Instructions      I am concerned today based on your clinical findings that you may be experiencing acute heart failure exacerbation.  Go to  [facility] emergency department immediately for further work-up and evaluation of your symptoms.     ED Prescriptions   None    PDMP not reviewed this encounter.   Scot Jun, FNP 05/17/21 1410

## 2021-05-18 ENCOUNTER — Emergency Department (HOSPITAL_COMMUNITY)
Admission: EM | Admit: 2021-05-18 | Discharge: 2021-05-19 | Disposition: A | Discharge status: Home / Self Care | Payer: Medicaid Other | Attending: Emergency Medicine | Admitting: Emergency Medicine

## 2021-05-18 ENCOUNTER — Emergency Department (HOSPITAL_COMMUNITY): Payer: Medicaid Other

## 2021-05-18 ENCOUNTER — Emergency Department (HOSPITAL_BASED_OUTPATIENT_CLINIC_OR_DEPARTMENT_OTHER): Payer: Medicaid Other

## 2021-05-18 ENCOUNTER — Encounter (HOSPITAL_COMMUNITY): Payer: Self-pay | Admitting: Emergency Medicine

## 2021-05-18 DIAGNOSIS — M79606 Pain in leg, unspecified: Secondary | ICD-10-CM | POA: Insufficient documentation

## 2021-05-18 DIAGNOSIS — M7989 Other specified soft tissue disorders: Secondary | ICD-10-CM

## 2021-05-18 DIAGNOSIS — R0602 Shortness of breath: Secondary | ICD-10-CM | POA: Insufficient documentation

## 2021-05-18 DIAGNOSIS — R600 Localized edema: Secondary | ICD-10-CM | POA: Insufficient documentation

## 2021-05-18 DIAGNOSIS — Z5321 Procedure and treatment not carried out due to patient leaving prior to being seen by health care provider: Secondary | ICD-10-CM | POA: Insufficient documentation

## 2021-05-18 DIAGNOSIS — R9431 Abnormal electrocardiogram [ECG] [EKG]: Secondary | ICD-10-CM | POA: Diagnosis not present

## 2021-05-18 LAB — CBC WITH DIFFERENTIAL/PLATELET
Abs Immature Granulocytes: 0.01 10*3/uL (ref 0.00–0.07)
Basophils Absolute: 0 10*3/uL (ref 0.0–0.1)
Basophils Relative: 0 %
Eosinophils Absolute: 0.2 10*3/uL (ref 0.0–0.5)
Eosinophils Relative: 2 %
HCT: 44.6 % (ref 39.0–52.0)
Hemoglobin: 15.3 g/dL (ref 13.0–17.0)
Immature Granulocytes: 0 %
Lymphocytes Relative: 36 %
Lymphs Abs: 3.5 10*3/uL (ref 0.7–4.0)
MCH: 34.4 pg — ABNORMAL HIGH (ref 26.0–34.0)
MCHC: 34.3 g/dL (ref 30.0–36.0)
MCV: 100.2 fL — ABNORMAL HIGH (ref 80.0–100.0)
Monocytes Absolute: 0.4 10*3/uL (ref 0.1–1.0)
Monocytes Relative: 4 %
Neutro Abs: 5.7 10*3/uL (ref 1.7–7.7)
Neutrophils Relative %: 58 %
Platelets: 270 10*3/uL (ref 150–400)
RBC: 4.45 MIL/uL (ref 4.22–5.81)
RDW: 13.4 % (ref 11.5–15.5)
WBC: 9.8 10*3/uL (ref 4.0–10.5)
nRBC: 0 % (ref 0.0–0.2)

## 2021-05-18 LAB — COMPREHENSIVE METABOLIC PANEL
ALT: 50 U/L — ABNORMAL HIGH (ref 0–44)
AST: 34 U/L (ref 15–41)
Albumin: 3.7 g/dL (ref 3.5–5.0)
Alkaline Phosphatase: 76 U/L (ref 38–126)
Anion gap: 6 (ref 5–15)
BUN: 12 mg/dL (ref 6–20)
CO2: 25 mmol/L (ref 22–32)
Calcium: 8.8 mg/dL — ABNORMAL LOW (ref 8.9–10.3)
Chloride: 106 mmol/L (ref 98–111)
Creatinine, Ser: 0.84 mg/dL (ref 0.61–1.24)
GFR, Estimated: >60 mL/min (ref >60)
Glucose, Bld: 130 mg/dL — ABNORMAL HIGH (ref 70–99)
Potassium: 3.3 mmol/L — ABNORMAL LOW (ref 3.5–5.1)
Sodium: 137 mmol/L (ref 135–145)
Total Bilirubin: 0.7 mg/dL (ref 0.3–1.2)
Total Protein: 6.5 g/dL (ref 6.5–8.1)

## 2021-05-18 LAB — BRAIN NATRIURETIC PEPTIDE: B Natriuretic Peptide: 86.5 pg/mL (ref 0.0–100.0)

## 2021-05-18 NOTE — ED Provider Notes (Signed)
Emergency Medicine Provider Triage Evaluation Note  [name] [name] , a 60 y.o. male  was evaluated in triage.  Pt complains of patient presents with bilateral leg swelling, states he saw an urgent care provider 3 days ago, they told him he was in acute heart failure and told him he had to come to the hospital.  Patient states he normally has swelling in his left leg which will come and go but over last 2 days his right leg started to swell, states he has some drainage coming from his legs, denies any calf pain, shortness of breath, chest pain.  States he is never been diagnosed with CHF in the past..  Review of Systems  Positive: Leg swelling, rash Negative: Chest pain, shortness of breath  Physical Exam  BP (!) 168/87 (BP Location: Left Arm)   Pulse (!) 57   Temp 98.2 F (36.8 C) (Oral)   Resp 14   Ht 5\' 11"  (1.803 m)   Wt 122.5 kg   SpO2 98%   BMI 37.66 kg/m  Gen:   Awake, no distress   Resp:  Normal effort  MSK:   Moves extremities without difficulty  Other:  Patient has 2+ pitting edema bilaterally, weeping present, neurovascularly intact in lower extremities.  Medical Decision Making  Medically screening exam initiated at 4:21 PM.  Appropriate orders placed.  Tyrez Lininger was informed that the remainder of the evaluation will be completed by another provider, this initial triage assessment does not replace that evaluation, and the importance of remaining in the ED until their evaluation is complete.  Patient presents with bilateral leg swelling, labwork imaging has been ordered, patient need further work-up here in the emergency department.   Marcello Fennel, PA-C 05/18/21 1629    Carmin Muskrat, MD 05/18/21 [phone]

## 2021-05-18 NOTE — ED Notes (Signed)
Called Pt to be roomed no answer.

## 2021-05-18 NOTE — ED Triage Notes (Signed)
Pt reports he was seen at [facility] yesterday and told he has heart failure and sent here. Endorses bilateral leg swelling (left leg for 6 years) for 2 days. SOB with exertion for a month. Having leg pain but reports an opoid addiction.

## 2021-05-18 NOTE — Progress Notes (Signed)
BLE venous duplex has been completed.  Preliminary results given to Burman Nieves, RN.  Results can be found under chart review under CV PROC. 05/18/2021 7:20 PM [name] [name] RVT, RDMS

## 2021-05-21 DIAGNOSIS — Z86718 Personal history of other venous thrombosis and embolism: Secondary | ICD-10-CM | POA: Diagnosis not present

## 2021-05-21 DIAGNOSIS — R9431 Abnormal electrocardiogram [ECG] [EKG]: Secondary | ICD-10-CM | POA: Diagnosis not present

## 2021-05-21 DIAGNOSIS — Z7901 Long term (current) use of anticoagulants: Secondary | ICD-10-CM | POA: Diagnosis not present

## 2021-05-21 DIAGNOSIS — L03119 Cellulitis of unspecified part of limb: Secondary | ICD-10-CM | POA: Diagnosis not present

## 2021-05-21 DIAGNOSIS — I10 Essential (primary) hypertension: Secondary | ICD-10-CM | POA: Diagnosis not present

## 2021-05-21 DIAGNOSIS — R0602 Shortness of breath: Secondary | ICD-10-CM | POA: Diagnosis not present

## 2021-05-24 DIAGNOSIS — L03119 Cellulitis of unspecified part of limb: Secondary | ICD-10-CM | POA: Diagnosis not present

## 2021-05-24 DIAGNOSIS — J449 Chronic obstructive pulmonary disease, unspecified: Secondary | ICD-10-CM | POA: Diagnosis not present

## 2021-05-24 DIAGNOSIS — F331 Major depressive disorder, recurrent, moderate: Secondary | ICD-10-CM | POA: Diagnosis not present

## 2021-05-24 DIAGNOSIS — M2550 Pain in unspecified joint: Secondary | ICD-10-CM | POA: Diagnosis not present

## 2021-05-24 DIAGNOSIS — Z122 Encounter for screening for malignant neoplasm of respiratory organs: Secondary | ICD-10-CM | POA: Diagnosis not present

## 2021-05-24 DIAGNOSIS — R7301 Impaired fasting glucose: Secondary | ICD-10-CM | POA: Diagnosis not present

## 2021-05-24 DIAGNOSIS — I10 Essential (primary) hypertension: Secondary | ICD-10-CM | POA: Diagnosis not present

## 2021-05-31 DIAGNOSIS — Z7689 Persons encountering health services in other specified circumstances: Secondary | ICD-10-CM | POA: Diagnosis not present

## 2021-06-02 DIAGNOSIS — Z7689 Persons encountering health services in other specified circumstances: Secondary | ICD-10-CM | POA: Diagnosis not present

## 2021-06-02 DIAGNOSIS — B029 Zoster without complications: Secondary | ICD-10-CM | POA: Diagnosis not present

## 2021-06-02 DIAGNOSIS — K5903 Drug induced constipation: Secondary | ICD-10-CM | POA: Diagnosis not present

## 2021-06-07 DIAGNOSIS — I83893 Varicose veins of bilateral lower extremities with other complications: Secondary | ICD-10-CM | POA: Diagnosis not present

## 2021-06-07 DIAGNOSIS — Z419 Encounter for procedure for purposes other than remedying health state, unspecified: Secondary | ICD-10-CM | POA: Diagnosis not present

## 2021-06-10 DIAGNOSIS — F17210 Nicotine dependence, cigarettes, uncomplicated: Secondary | ICD-10-CM | POA: Diagnosis not present

## 2021-06-10 DIAGNOSIS — Z7689 Persons encountering health services in other specified circumstances: Secondary | ICD-10-CM | POA: Diagnosis not present

## 2021-06-15 ENCOUNTER — Encounter (HOSPITAL_COMMUNITY): Payer: Self-pay

## 2021-06-15 ENCOUNTER — Emergency Department (HOSPITAL_COMMUNITY)
Admission: EM | Admit: 2021-06-15 | Discharge: 2021-06-15 | Disposition: A | Discharge status: Home / Self Care | Payer: Medicaid Other | Attending: Emergency Medicine | Admitting: Emergency Medicine

## 2021-06-15 ENCOUNTER — Other Ambulatory Visit: Payer: Self-pay

## 2021-06-15 DIAGNOSIS — M79604 Pain in right leg: Secondary | ICD-10-CM | POA: Diagnosis not present

## 2021-06-15 DIAGNOSIS — Z5321 Procedure and treatment not carried out due to patient leaving prior to being seen by health care provider: Secondary | ICD-10-CM | POA: Insufficient documentation

## 2021-06-15 DIAGNOSIS — M79605 Pain in left leg: Secondary | ICD-10-CM | POA: Insufficient documentation

## 2021-06-15 NOTE — ED Notes (Signed)
Nurse noticed pt was sitting in chair. Nurse went into room to remind him that having his legs down increases fluid and edema. Pt interrupted nurse and said" I am leaving, I am not going to be in this room, closed up.  Pt verbalized he was leaving AMA. Pt walked out.

## 2021-06-15 NOTE — ED Triage Notes (Signed)
Pt. Is complaining of bilateral leg pain with sores bilaterally on legs.

## 2021-06-16 DIAGNOSIS — Z0000 Encounter for general adult medical examination without abnormal findings: Secondary | ICD-10-CM | POA: Diagnosis not present

## 2021-06-16 DIAGNOSIS — Z125 Encounter for screening for malignant neoplasm of prostate: Secondary | ICD-10-CM | POA: Diagnosis not present

## 2021-06-16 DIAGNOSIS — Z7689 Persons encountering health services in other specified circumstances: Secondary | ICD-10-CM | POA: Diagnosis not present

## 2021-06-16 DIAGNOSIS — Z130 Encounter for screening for diseases of the blood and blood-forming organs and certain disorders involving the immune mechanism: Secondary | ICD-10-CM | POA: Diagnosis not present

## 2021-06-16 DIAGNOSIS — R195 Other fecal abnormalities: Secondary | ICD-10-CM | POA: Diagnosis not present

## 2021-06-16 DIAGNOSIS — Z13220 Encounter for screening for lipoid disorders: Secondary | ICD-10-CM | POA: Diagnosis not present

## 2021-06-30 DIAGNOSIS — K5903 Drug induced constipation: Secondary | ICD-10-CM | POA: Diagnosis not present

## 2021-06-30 DIAGNOSIS — Z7689 Persons encountering health services in other specified circumstances: Secondary | ICD-10-CM | POA: Diagnosis not present

## 2021-06-30 DIAGNOSIS — B029 Zoster without complications: Secondary | ICD-10-CM | POA: Diagnosis not present

## 2021-07-01 DIAGNOSIS — R0600 Dyspnea, unspecified: Secondary | ICD-10-CM | POA: Diagnosis not present

## 2021-07-01 DIAGNOSIS — R0789 Other chest pain: Secondary | ICD-10-CM | POA: Insufficient documentation

## 2021-07-01 DIAGNOSIS — R609 Edema, unspecified: Secondary | ICD-10-CM | POA: Diagnosis not present

## 2021-07-01 DIAGNOSIS — R001 Bradycardia, unspecified: Secondary | ICD-10-CM | POA: Insufficient documentation

## 2021-07-01 DIAGNOSIS — I10 Essential (primary) hypertension: Secondary | ICD-10-CM | POA: Diagnosis not present

## 2021-07-01 DIAGNOSIS — R9431 Abnormal electrocardiogram [ECG] [EKG]: Secondary | ICD-10-CM | POA: Insufficient documentation

## 2021-07-01 DIAGNOSIS — R0609 Other forms of dyspnea: Secondary | ICD-10-CM | POA: Insufficient documentation

## 2021-07-01 DIAGNOSIS — Z7689 Persons encountering health services in other specified circumstances: Secondary | ICD-10-CM | POA: Diagnosis not present

## 2021-07-05 ENCOUNTER — Other Ambulatory Visit: Payer: Self-pay

## 2021-07-05 ENCOUNTER — Emergency Department (HOSPITAL_COMMUNITY)
Admission: EM | Admit: 2021-07-05 | Discharge: 2021-07-06 | Disposition: A | Discharge status: Home / Self Care | Payer: Medicaid Other | Attending: Emergency Medicine | Admitting: Emergency Medicine

## 2021-07-05 ENCOUNTER — Encounter (HOSPITAL_COMMUNITY): Payer: Self-pay | Admitting: *Deleted

## 2021-07-05 DIAGNOSIS — J449 Chronic obstructive pulmonary disease, unspecified: Secondary | ICD-10-CM | POA: Diagnosis not present

## 2021-07-05 DIAGNOSIS — I10 Essential (primary) hypertension: Secondary | ICD-10-CM | POA: Insufficient documentation

## 2021-07-05 DIAGNOSIS — R52 Pain, unspecified: Secondary | ICD-10-CM | POA: Diagnosis not present

## 2021-07-05 DIAGNOSIS — F17210 Nicotine dependence, cigarettes, uncomplicated: Secondary | ICD-10-CM | POA: Insufficient documentation

## 2021-07-05 DIAGNOSIS — Z8673 Personal history of transient ischemic attack (TIA), and cerebral infarction without residual deficits: Secondary | ICD-10-CM | POA: Diagnosis not present

## 2021-07-05 DIAGNOSIS — Z86711 Personal history of pulmonary embolism: Secondary | ICD-10-CM | POA: Insufficient documentation

## 2021-07-05 DIAGNOSIS — Z743 Need for continuous supervision: Secondary | ICD-10-CM | POA: Diagnosis not present

## 2021-07-05 DIAGNOSIS — R609 Edema, unspecified: Secondary | ICD-10-CM | POA: Insufficient documentation

## 2021-07-05 DIAGNOSIS — R6889 Other general symptoms and signs: Secondary | ICD-10-CM | POA: Diagnosis not present

## 2021-07-05 DIAGNOSIS — M79605 Pain in left leg: Secondary | ICD-10-CM | POA: Diagnosis present

## 2021-07-05 DIAGNOSIS — Z79899 Other long term (current) drug therapy: Secondary | ICD-10-CM | POA: Insufficient documentation

## 2021-07-05 DIAGNOSIS — R0602 Shortness of breath: Secondary | ICD-10-CM | POA: Diagnosis not present

## 2021-07-05 DIAGNOSIS — L03116 Cellulitis of left lower limb: Secondary | ICD-10-CM

## 2021-07-05 DIAGNOSIS — Z7901 Long term (current) use of anticoagulants: Secondary | ICD-10-CM | POA: Diagnosis not present

## 2021-07-05 DIAGNOSIS — R079 Chest pain, unspecified: Secondary | ICD-10-CM | POA: Diagnosis not present

## 2021-07-05 MED ORDER — HYDROCODONE-ACETAMINOPHEN 5-325 MG PO TABS
1.0000 | ORAL_TABLET | Freq: Once | ORAL | Status: AC
  Administered 2021-07-06: 1 via ORAL
  Filled 2021-07-05: qty 1

## 2021-07-05 NOTE — ED Triage Notes (Signed)
Pt with continued bilateral leg pain with sores to both legs, left leg sores are weeping.  Left LE has redness as well.

## 2021-07-06 ENCOUNTER — Emergency Department (HOSPITAL_COMMUNITY): Payer: Medicaid Other

## 2021-07-06 DIAGNOSIS — R079 Chest pain, unspecified: Secondary | ICD-10-CM | POA: Diagnosis not present

## 2021-07-06 DIAGNOSIS — R0602 Shortness of breath: Secondary | ICD-10-CM | POA: Diagnosis not present

## 2021-07-06 LAB — CBC WITH DIFFERENTIAL/PLATELET
Abs Immature Granulocytes: 0.02 10*3/uL (ref 0.00–0.07)
Basophils Absolute: 0.1 10*3/uL (ref 0.0–0.1)
Basophils Relative: 1 %
Eosinophils Absolute: 0.3 10*3/uL (ref 0.0–0.5)
Eosinophils Relative: 3 %
HCT: 46.8 % (ref 39.0–52.0)
Hemoglobin: 15.4 g/dL (ref 13.0–17.0)
Immature Granulocytes: 0 %
Lymphocytes Relative: 44 %
Lymphs Abs: 3.8 10*3/uL (ref 0.7–4.0)
MCH: 33.9 pg (ref 26.0–34.0)
MCHC: 32.9 g/dL (ref 30.0–36.0)
MCV: 103.1 fL — ABNORMAL HIGH (ref 80.0–100.0)
Monocytes Absolute: 0.5 10*3/uL (ref 0.1–1.0)
Monocytes Relative: 6 %
Neutro Abs: 4 10*3/uL (ref 1.7–7.7)
Neutrophils Relative %: 46 %
Platelets: 222 10*3/uL (ref 150–400)
RBC: 4.54 MIL/uL (ref 4.22–5.81)
RDW: 13 % (ref 11.5–15.5)
WBC: 8.7 10*3/uL (ref 4.0–10.5)
nRBC: 0 % (ref 0.0–0.2)

## 2021-07-06 LAB — BASIC METABOLIC PANEL
Anion gap: 4 — ABNORMAL LOW (ref 5–15)
BUN: 18 mg/dL (ref 6–20)
CO2: 23 mmol/L (ref 22–32)
Calcium: 7.9 mg/dL — ABNORMAL LOW (ref 8.9–10.3)
Chloride: 111 mmol/L (ref 98–111)
Creatinine, Ser: 0.82 mg/dL (ref 0.61–1.24)
GFR, Estimated: >60 mL/min (ref >60)
Glucose, Bld: 106 mg/dL — ABNORMAL HIGH (ref 70–99)
Potassium: 3.4 mmol/L — ABNORMAL LOW (ref 3.5–5.1)
Sodium: 138 mmol/L (ref 135–145)

## 2021-07-06 LAB — BRAIN NATRIURETIC PEPTIDE: B Natriuretic Peptide: 49 pg/mL (ref 0.0–100.0)

## 2021-07-06 MED ORDER — DOXYCYCLINE HYCLATE 100 MG PO CAPS: 100.0000 mg | ORAL_CAPSULE | Freq: Two times a day (BID) | ORAL | 0 refills | Status: DC

## 2021-07-06 NOTE — Discharge Instructions (Addendum)
Please keep your leg elevated.  This will help with the swelling.  Please follow-up with your cardiologist and primary care doctor as already planned

## 2021-07-06 NOTE — ED Notes (Signed)
EDP in room  

## 2021-07-06 NOTE — ED Provider Notes (Signed)
[facility] EMERGENCY DEPARTMENT Provider Note   CSN: [id number] Arrival date & time: 07/05/21  2204     History Chief Complaint  Patient presents with   Leg Pain    [name] [name] is a 60 y.o. male.  The history is provided by the patient.  Leg Pain Location:  Leg Leg location:  L leg and R leg Pain details:    Quality:  Aching   Severity:  Moderate   Onset quality:  Gradual   Timing:  Constant   Progression:  Worsening Chronicity:  Recurrent Relieved by:  Nothing Worsened by:  Nothing Associated symptoms: no fever   Patient with history of COPD, stroke, PE on anticoagulation, hypertension presents with bilateral leg swelling.  Patient reports he has had leg pain and swelling for months.  He reports he injured his left leg while mowing grass and has had a chronic wound since that time.  He reports he has been on antibiotics at least twice before, but not currently No new traumas reported.  He reports the wounds are now leaking fluid.  No fever/vomiting/chest pain at this time.  He reports chronic shortness of breath due to his COPD.  He does report chronic dyspnea on exertion.  He is unsure if he is gained any weight.    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   onset 02/14/14   PE (pulmonary embolism)    DVT left leg   Status post spinal surgery    Stroke [facility])    Tobacco abuse     Patient Active Problem List   Diagnosis Date Noted   Headache 01/25/2016   Tobacco use disorder    Fall    Aphasia due to recent stroke    Stroke ([place]) 09/18/2015   Right sided weakness 09/18/2015   Right knee pain 07/21/2015   Wound of skin 06/03/2015   Chronic pain syndrome 05/28/2014   Anxiety state 05/28/2014   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    Depression 03/24/2014   Low back pain 02/16/2014   Left foot drop 02/14/2014   HTN (hypertension) 11/15/2011   Hyperlipidemia 11/15/2011   COPD (chronic obstructive pulmonary disease) ([place]) 11/15/2011   Tobacco abuse 11/15/2011    Past Surgical History:  Procedure Laterality Date   SPINAL FIXATION SURGERY     cervical       Family History  Problem Relation Age of Onset   Cancer Mother    Depression Sister     Social History   Tobacco Use   Smoking status: Every [place]    Packs/Markoff: 1.50    Types: Cigarettes   Smokeless tobacco: Never   Tobacco comments:    increased to 2ppd  Substance Use Topics   Alcohol use: No    Alcohol/week: 0.0 standard drinks    Comment: former   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    Comment: Heroin    Home Medications Prior to Admission medications   Medication Sig Start Date End Date Taking? Authorizing Provider  doxycycline (VIBRAMYCIN) 100 MG capsule Take 1 capsule (100 mg total) by mouth 2 (two) times daily. One po bid x 7 days 07/06/21  Yes Ripley Fraise, MD  albuterol (PROVENTIL HFA;VENTOLIN HFA) 108 (90 BASE) MCG/ACT inhaler Inhale 2 puffs  into the lungs every 4 (four) hours as needed for wheezing. Reported on 04/11/2016    [provider]  amLODipine (NORVASC) 10 MG tablet Take 1 tablet (10 mg total) by mouth daily. Patient not taking: Reported on 06/15/2021 01/21/16   Leone Brand, MD  atorvastatin (LIPITOR) 80 MG tablet TAKE 1 TABLET (80 MG TOTAL) BY MOUTH DAILY AT 6 PM. Patient taking differently: Take 80 mg by mouth daily. 03/09/16   Leone Brand, MD  Azelastine HCl 137 MCG/SPRAY SOLN Place 1 spray into both nostrils 2 (two) times daily. 06/07/21   [provider]  busPIRone (BUSPAR) 10 MG tablet Take 1 tablet (10 mg total) by mouth 2 (two) times daily. Start after finishing 7.'5mg'$  tablets Patient not taking: No sig reported 04/11/16   Leone Brand, MD  busPIRone (BUSPAR) 7.5 MG tablet Take 1 tablet (7.5 mg total) by mouth 2  (two) times daily. For 14 days, then increase to '10mg'$  twice daily Patient not taking: No sig reported 04/11/16   Leone Brand, MD  finasteride (PROSCAR) 5 MG tablet Take 1 tablet (5 mg total) by mouth daily. 03/09/16   Leone Brand, MD  furosemide (LASIX) 20 MG tablet Take 20 mg by mouth in the morning and at bedtime. 05/21/21   [provider]  ibuprofen (ADVIL) 800 MG tablet Take 1 tablet (800 mg total) by mouth every 8 (eight) hours as needed. Patient not taking: No sig reported 05/31/19   Milton Ferguson, MD  lisinopril (PRINIVIL,ZESTRIL) 40 MG tablet Take 1 tablet (40 mg total) by mouth daily. 03/09/16   Leone Brand, MD  LORazepam (ATIVAN) 1 MG tablet Take 1 tablet (1 mg total) by mouth 2 (two) times daily as needed. Patient not taking: No sig reported 06/27/16   Katheren Shams, DO  mupirocin ointment (BACTROBAN) 2 % Apply 1 application topically 2 (two) times daily. Patient not taking: No sig reported 04/18/20   Wurst, [place], PA-C  ondansetron (ZOFRAN ODT) 4 MG disintegrating tablet '4mg'$  ODT q4 hours prn nausea/vomit Patient not taking: No sig reported 05/31/19   Milton Ferguson, MD  oxyCODONE-acetaminophen (PERCOCET) 10-325 MG tablet Take 1 tablet by mouth every 4 (four) hours as needed for pain. Refill after 05/20/16 Patient not taking: No sig reported 05/30/16   Katheren Shams, DO  predniSONE (DELTASONE) 20 MG tablet Take 60 mg daily x 2 days then 40 mg daily x 2 days then 20 mg daily x 2 days Patient not taking: No sig reported 06/04/20   Drenda Freeze, MD  pregabalin (LYRICA) 300 MG capsule Take 1 capsule (300 mg total) by mouth 2 (two) times daily. Patient taking differently: Take 150 mg by mouth in the morning, at noon, in the evening, and at bedtime. 05/30/16   Katheren Shams, DO  propranolol (INDERAL) 20 MG tablet Take 1 tablet (20 mg total) by mouth 3 (three) times daily. Patient not taking: No sig reported 03/21/16   Leone Brand, MD  rivaroxaban (XARELTO) 20 MG  TABS tablet TAKE 1 TABLET BY MOUTH EVERY [place] WITH SUPPER Patient taking differently: Take 20 mg by mouth daily with supper. 03/09/16   Leone Brand, MD  SUBOXONE 8-2 MG FILM Place 1 strip under the tongue 3 (three) times daily. 06/02/21   [provider]  traZODone (DESYREL) 100 MG tablet TAKE 1 TABLET BY MOUTH AT BEDTIME Patient not taking: No sig reported 06/27/16   Katheren Shams, DO  venlafaxine XR (  EFFEXOR-XR) 150 MG 24 hr capsule TAKE ONE CAPSULE BY MOUTH EVERY [place] Patient taking differently: Take 150 mg by mouth daily with breakfast. 01/20/16   Leone Brand, MD    Allergies    Amlodipine, Fentanyl, and Methadone  Review of Systems   Review of Systems  Constitutional:  Negative for fever.  Respiratory:  Positive for shortness of breath.   Cardiovascular:  Positive for leg swelling. Negative for chest pain.  All other systems reviewed and are negative.  Physical Exam Updated Vital Signs BP 131/67   Pulse 63   Temp 98.3 F (36.8 C) (Oral)   Resp 12   Ht 1.803 m ('5\' 11"'$ )   Wt 121.1 kg   SpO2 95%   BMI 37.24 kg/m   Physical Exam CONSTITUTIONAL: Chronically ill appearing, no acute distress HEAD: Normocephalic/atraumatic EYES: EOMI/PERRL ENMT: Mucous membranes moist NECK: supple no meningeal signs, no JVD SPINE/BACK:entire spine nontender CV: S1/S2 noted, no murmurs/rubs/gallops noted LUNGS: Lungs are clear to auscultation bilaterally, no apparent distress ABDOMEN: soft, nontender, no rebound or guarding, bowel sounds noted throughout abdomen GU:no cva tenderness NEURO: Pt is awake/alert/appropriate, moves all extremitiesx4.  No facial droop.   EXTREMITIES: pulses normal/equal, full ROM Distal pulses intact.  No crepitus noted to bilateral lower extremities.  See photo SKIN: See photo PSYCH: no abnormalities of mood noted, alert and oriented to situation    ED Results / Procedures / Treatments   Labs (all labs ordered are listed, but only abnormal  results are displayed) Labs Reviewed  BASIC METABOLIC PANEL - Abnormal; Notable for the following components:      Result Value   Potassium 3.4 (*)    Glucose, Bld 106 (*)    Calcium 7.9 (*)    Anion gap 4 (*)    All other components within normal limits  CBC WITH DIFFERENTIAL/PLATELET - Abnormal; Notable for the following components:   MCV 103.1 (*)    All other components within normal limits  BRAIN NATRIURETIC PEPTIDE    EKG EKG Interpretation  Date/Time:  Tuesday July 06 2021 00:06:50 EDT Ventricular Rate:  64 PR Interval:  170 QRS Duration: 81 QT Interval:  415 QTC Calculation: 429 R Axis:   7 Text Interpretation: Sinus rhythm Borderline repolarization abnormality Baseline wander in lead(s) I V1 Confirmed by Ripley Fraise [phone]) on 07/06/2021 12:12:29 AM  Radiology DG Chest Port 1 View  Result Date: 07/06/2021 CLINICAL DATA:  Chest pain, shortness of breath EXAM: PORTABLE CHEST 1 VIEW COMPARISON:  05/18/2021 FINDINGS: Lungs are clear.  No pleural effusion or pneumothorax. The heart is normal in size. Cervical spine fixation hardware. IMPRESSION: No evidence of acute cardiopulmonary disease. Electronically Signed   By: Julian Hy M.D.   On: 07/06/2021 00:33    Procedures Procedures   Medications Ordered in ED Medications  HYDROcodone-acetaminophen (NORCO/VICODIN) 5-325 MG per tablet 1 tablet (1 tablet Oral Given 07/06/21 0001)    ED Course  I have reviewed the triage vital signs and the nursing notes.  Pertinent labs & imaging results that were available during my care of the patient were reviewed by me and considered in my medical decision making (see chart for details).    MDM Rules/Calculators/A&P                           Patient presents with increasing lower extremity edema.  He reports there was no acute worsening tonight but he is concerned about the fluid leaking.  He had a negative bilateral DVT study done in July.  Reviewing his notes from  [place], patient recently seen by cardiology and started on hydralazine and already takes Lasix 20 twice daily It appears that he was on Bactrim last month. Cardiology felt this might be due to chronic venous insufficiency.  On this visit, we will screen for signs of congestive heart failure. At this point I do  not feel acute DVT and patient is already reportedly on anticoagulation, the patient cannot recall the medicines that he is taking. If work-up is unremarkable for CHF, will plan to start him on oral antibiotics as his left leg may be reinfected from the chronic wound. 1:45 AM Labs and chest x-ray are reassuring, no signs of CHF.  I personally reviewed the chest x-ray We will treat for cellulitis. Advised to keep leg elevated. Advised wound care. We discussed return precautions Final Clinical Impression(s) / ED Diagnoses Final diagnoses:  Cellulitis of left lower extremity  Peripheral edema    Rx / DC Orders ED Discharge Orders          Ordered    doxycycline (VIBRAMYCIN) 100 MG capsule  2 times daily        07/06/21 0144             Ripley Fraise, MD 07/06/21 0145

## 2021-07-06 NOTE — ED Notes (Signed)
Pt unhooked self from monitor. Pt ready to go; did not get updated v/s. Pt leg bandaged. Education and a few extra bandages given along with d/c papers

## 2021-07-06 NOTE — ED Notes (Signed)
Lab in room; xr waiting outside room.

## 2021-07-08 DIAGNOSIS — Z7689 Persons encountering health services in other specified circumstances: Secondary | ICD-10-CM | POA: Diagnosis not present

## 2021-07-08 DIAGNOSIS — Z419 Encounter for procedure for purposes other than remedying health state, unspecified: Secondary | ICD-10-CM | POA: Diagnosis not present

## 2021-07-08 DIAGNOSIS — R079 Chest pain, unspecified: Secondary | ICD-10-CM | POA: Diagnosis not present

## 2021-07-08 DIAGNOSIS — R0600 Dyspnea, unspecified: Secondary | ICD-10-CM | POA: Diagnosis not present

## 2021-07-08 DIAGNOSIS — R9431 Abnormal electrocardiogram [ECG] [EKG]: Secondary | ICD-10-CM | POA: Diagnosis not present

## 2021-07-28 DIAGNOSIS — K5903 Drug induced constipation: Secondary | ICD-10-CM | POA: Diagnosis not present

## 2021-07-28 DIAGNOSIS — Z7689 Persons encountering health services in other specified circumstances: Secondary | ICD-10-CM | POA: Diagnosis not present

## 2021-07-28 DIAGNOSIS — B029 Zoster without complications: Secondary | ICD-10-CM | POA: Diagnosis not present

## 2021-08-02 ENCOUNTER — Other Ambulatory Visit: Payer: Self-pay | Admitting: *Deleted

## 2021-08-02 DIAGNOSIS — M25561 Pain in right knee: Secondary | ICD-10-CM

## 2021-08-02 DIAGNOSIS — M25562 Pain in left knee: Secondary | ICD-10-CM

## 2021-08-06 ENCOUNTER — Ambulatory Visit (HOSPITAL_COMMUNITY)
Admission: RE | Admit: 2021-08-06 | Discharge: 2021-08-06 | Disposition: A | Discharge status: Home / Self Care | Payer: Medicaid Other | Source: Ambulatory Visit | Attending: Physician Assistant | Admitting: Physician Assistant

## 2021-08-06 ENCOUNTER — Encounter: Payer: Self-pay | Admitting: Physician Assistant

## 2021-08-06 ENCOUNTER — Ambulatory Visit (INDEPENDENT_AMBULATORY_CARE_PROVIDER_SITE_OTHER): Payer: Medicaid Other | Admitting: Physician Assistant

## 2021-08-06 ENCOUNTER — Other Ambulatory Visit: Payer: Self-pay

## 2021-08-06 VITALS — BP 172/89 | HR 61 | Temp 98.4°F | Resp 18 | Ht 71.0 in | Wt 265.0 lb

## 2021-08-06 DIAGNOSIS — M25562 Pain in left knee: Secondary | ICD-10-CM

## 2021-08-06 DIAGNOSIS — Z7689 Persons encountering health services in other specified circumstances: Secondary | ICD-10-CM | POA: Diagnosis not present

## 2021-08-06 DIAGNOSIS — M25561 Pain in right knee: Secondary | ICD-10-CM

## 2021-08-06 DIAGNOSIS — I872 Venous insufficiency (chronic) (peripheral): Secondary | ICD-10-CM | POA: Diagnosis not present

## 2021-08-06 DIAGNOSIS — L02419 Cutaneous abscess of limb, unspecified: Secondary | ICD-10-CM

## 2021-08-06 DIAGNOSIS — L03119 Cellulitis of unspecified part of limb: Secondary | ICD-10-CM

## 2021-08-06 MED ORDER — AMOXICILLIN-POT CLAVULANATE 875-125 MG PO TABS: 1.0000 | ORAL_TABLET | Freq: Two times a day (BID) | ORAL | 1 refills | Status: DC

## 2021-08-06 NOTE — Progress Notes (Signed)
[facility] [place]   Reason for referral: Swollen B leg  History of Present Illness  [name] [name] is a 60 y.o. male who presents with chief complaint: swollen leg with non healing wound on the left LE.  Patient notes, onset of swelling several months ago, associated with trauma on the left LE.  He was seen in the [facility] ED for B LE swelling.  The DVT study was negative.  He was given Lasix 20 mg BID.  He has been on oral antibiotics Bactrim a month ago.  The patient has had history of DVT left LE popliteal partially thrombosed, right popliteal, PT, peroneal venous thrombus and PE in [address]15, which is currently managed on Xarelto negative history of varicose vein, positive history of venous stasis ulcers, negative history of  Lymphedema and positive history of skin changes in lower legs.  The patient has used compression stockings in the past.  Past medical history:DVT B LE, COPD, current tobacco abuse, CVA, Neuropathy, and HTN.   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   onset 02/14/14   PE (pulmonary embolism)    DVT left leg   Status post spinal surgery    Stroke [facility])    Tobacco abuse     Past Surgical History:  Procedure Laterality Date   SPINAL FIXATION SURGERY     cervical    Social History   Socioeconomic History   Marital status: Divorced    Spouse name: Not on file   Number of children: Not on file   Years of education: Not on file   Highest education level: Not on file  Occupational History   Not on file  Tobacco Use   Smoking status: Every [place]    Packs/Brand[place]etter: 1.50    Types: Cigarettes   Smokeless tobacco: Never   Tobacco comments:    increased to 2ppd  Substance and Sexual Activity   Alcohol use: No    Alcohol/week: 0.0 standard  drinks    Comment: former   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    Comment: Heroin   Sexual activity: Yes  Other Topics Concern   Not on file  Social History Narrative   Not on file   Social Determinants of Health   Financial Resource Strain: Not on file  Food Insecurity: Not on file  Transportation Needs: Not on file  Physical Activity: Not on file  Stress: Not on file  Social Connections: Not on file  Intimate Partner Violence: Not on file    Family History  Problem Relation Age of Onset   Cancer Mother    Depression Sister     Current Outpatient Medications on File Prior to Visit  Medication Sig Dispense Refill   amLODipine (NORVASC) 10 MG tablet Take 1 tablet (10 mg total) by mouth daily. 90 tablet 3   atorvastatin (LIPITOR) 80 MG tablet TAKE 1 TABLET (80 MG TOTAL) BY MOUTH DAILY AT 6 PM. (Patient taking differently: Take 80 mg by mouth daily.) 90 tablet 1   Azelastine HCl 137 MCG/SPRAY SOLN Place 1 spray into both nostrils 2 (two) times daily.     finasteride (PROSCAR) 5 MG tablet Take 1 tablet (5 mg total) by mouth daily. 30 tablet 5   furosemide (LASIX)  20 MG tablet Take 20 mg by mouth in the morning and at bedtime.     ibuprofen (ADVIL) 800 MG tablet Take 1 tablet (800 mg total) by mouth every 8 (eight) hours as needed. 21 tablet 0   lisinopril (PRINIVIL,ZESTRIL) 40 MG tablet Take 1 tablet (40 mg total) by mouth daily. 90 tablet 3   pregabalin (LYRICA) 300 MG capsule Take 1 capsule (300 mg total) by mouth 2 (two) times daily. (Patient taking differently: Take 150 mg by mouth in the morning, at noon, in the evening, and at bedtime.) 60 capsule 1   rivaroxaban (XARELTO) 20 MG TABS tablet TAKE 1 TABLET BY MOUTH EVERY [place] WITH SUPPER (Patient taking differently: Take 20 mg by mouth daily with supper.) 30 tablet 2   SUBOXONE 8-2 MG FILM Place 1 strip under the tongue 3 (three) times daily.     venlafaxine XR (EFFEXOR-XR) 150 MG 24 hr capsule TAKE ONE CAPSULE BY MOUTH EVERY [place] (Patient  taking differently: Take 150 mg by mouth daily with breakfast.) 30 capsule 4   albuterol (PROVENTIL HFA;VENTOLIN HFA) 108 (90 BASE) MCG/ACT inhaler Inhale 2 puffs into the lungs every 4 (four) hours as needed for wheezing. Reported on 04/11/2016     busPIRone (BUSPAR) 10 MG tablet Take 1 tablet (10 mg total) by mouth 2 (two) times daily. Start after finishing 7.5mg  tablets (Patient not taking: No sig reported) 60 tablet 0   busPIRone (BUSPAR) 7.5 MG tablet Take 1 tablet (7.5 mg total) by mouth 2 (two) times daily. For 14 days, then increase to 10mg  twice daily (Patient not taking: No sig reported) 28 tablet 0   doxycycline (VIBRAMYCIN) 100 MG capsule Take 1 capsule (100 mg total) by mouth 2 (two) times daily. One po bid x 7 days (Patient not taking: Reported on 08/06/2021) 14 capsule 0   LORazepam (ATIVAN) 1 MG tablet Take 1 tablet (1 mg total) by mouth 2 (two) times daily as needed. (Patient not taking: No sig reported) 60 tablet 0   mupirocin ointment (BACTROBAN) 2 % Apply 1 application topically 2 (two) times daily. (Patient not taking: No sig reported) 30 g 1   ondansetron (ZOFRAN ODT) 4 MG disintegrating tablet 4mg  ODT q4 hours prn nausea/vomit (Patient not taking: No sig reported) 12 tablet 0   oxyCODONE-acetaminophen (PERCOCET) 10-325 MG tablet Take 1 tablet by mouth every 4 (four) hours as needed for pain. Refill after 05/20/16 (Patient not taking: No sig reported) 180 tablet 0   predniSONE (DELTASONE) 20 MG tablet Take 60 mg daily x 2 days then 40 mg daily x 2 days then 20 mg daily x 2 days (Patient not taking: Reported on 08/06/2021) 12 tablet 0   propranolol (INDERAL) 20 MG tablet Take 1 tablet (20 mg total) by mouth 3 (three) times daily. (Patient not taking: No sig reported) 90 tablet 1   traZODone (DESYREL) 100 MG tablet TAKE 1 TABLET BY MOUTH AT BEDTIME (Patient not taking: No sig reported) 90 tablet 1   No current facility-administered medications on file prior to visit.    Allergies as of  08/06/2021 - Review Complete 08/06/2021  Allergen Reaction Noted   Amlodipine Swelling 03/06/2019   Fentanyl  11/15/2011   Methadone Itching 11/15/2011     ROS:   General:  No weight loss, Fever, chills  HEENT: No recent headaches, no nasal bleeding, no visual changes, no sore throat  Neurologic: No dizziness, blackouts, seizures. No recent symptoms of stroke or mini- stroke. No recent episodes  of slurred speech, or temporary blindness.  Cardiac: No recent episodes of chest pain/pressure, no shortness of breath at rest.  No shortness of breath with exertion.  Denies history of atrial fibrillation or irregular heartbeat  Vascular: No history of rest pain in feet.  No history of claudication.  No history of non-healing ulcer, No history of DVT   Pulmonary: No home oxygen, no productive cough, no hemoptysis,  No asthma or wheezing  Musculoskeletal:  [ ]  Arthritis, [ x] Low back pain,  [ ]  Joint pain  Hematologic:No history of hypercoagulable state.  No history of easy bleeding.  No history of anemia  Gastrointestinal: No hematochezia or melena,  No gastroesophageal reflux, no trouble swallowing  Urinary: [ ]  chronic Kidney disease, [ ]  on HD - [ ]  MWF or [ ]  TTHS, [ ]  Burning with urination, [ ]  Frequent urination, [ ]  Difficulty urinating;   Skin: No rashes  Psychological: No history of anxiety,  No history of depression  Physical Examination  Vitals:   08/06/21 1002  BP: (!) 172/89  Pulse: 61  Resp: 18  Temp: 98.4 F (36.9 C)  TempSrc: Temporal  SpO2: 99%  Weight: 265 lb (120.2 kg)  Height: 5\' 11"  (1.803 m)    Body mass index is 36.96 kg/m.  General:  Alert and oriented, no acute distress HEENT: Normal Neck: No bruit or JVD Pulmonary: Clear to auscultation bilaterally Cardiac: Regular Rate and Rhythm without murmur Abdomen: Soft, non-tender, non-distended, no mass, no scars Skin: No rash       Extremity Pulses:  2+ radial, brachial, femoral, doppler  signals dorsalis pedis, posterior tibial bilaterally Musculoskeletal: No deformity or edema  Neurologic: Upper and lower extremity motor 5/5 and symmetric  DATA: +------------------+---------+------+-----------+------------+--------+  LEFT              Reflux NoRefluxReflux TimeDiameter cmsComments                              Yes                                   +------------------+---------+------+-----------+------------+--------+  CFV                         yes   >1 second                       +------------------+---------+------+-----------+------------+--------+  Popliteal                   yes   >1 second                       +------------------+---------+------+-----------+------------+--------+  GSV at SFJ                  yes    >500 ms      0.98              +------------------+---------+------+-----------+------------+--------+  GSV prox thigh    no                            0.68              +------------------+---------+------+-----------+------------+--------+  GSV mid thigh  yes    >500 ms      0.54              +------------------+---------+------+-----------+------------+--------+  GSV dist thigh    no                            0.51              +------------------+---------+------+-----------+------------+--------+  GSV at knee       no                            0.38              +------------------+---------+------+-----------+------------+--------+  SSV Pop Fossa     no                            0.45              +------------------+---------+------+-----------+------------+--------+  anterior accessoryno                            0.46              +------------------+---------+------+-----------+------------+--------+     Summary:  Left:  - No evidence of deep vein thrombosis from the common femoral through the  popliteal veins.  - No evidence of superficial  venous thrombosis.  - The common femoral and popliteal veins are not competent.  - The mid great saphenous vein is not competent.  - The small saphenous vein is competent.     Assessment: Venous disease with non healing left LE wound. He has a history of DVT in B LE and saddle PEin 2015.  He has been managed on Xarelto for anticoagulation. The venous duplex reveals Deep venous reflux in the common femoral and popliteal veins. The SFJ and mid thigh have reflux with GSV size is > 0.4to the distal popliteal. He has B LE wounds with edema.  The left LE is cellulitic with sever weeping and non healing wounds.   He has finished a 7 [place] course of Doxycycline Early this month of September.     Plan: We will place him in B una boots.  I will start him on elevation when at rest.  He will f/u in 1 week for wound checks and una boot changes.  When the wound clear up he will be scheduled to f/u with our vein clinic for consideration of interventional treatment and fitted with compression.  I will refer him to [facility] RN for [employer] changes.  I will place on Augmentin for 14 days for the cellulitis.   Roxy Horseman PA-C [facility] [place] Office: [phone]  MD in clinic [place]

## 2021-08-07 DIAGNOSIS — Z419 Encounter for procedure for purposes other than remedying health state, unspecified: Secondary | ICD-10-CM | POA: Diagnosis not present

## 2021-08-13 ENCOUNTER — Other Ambulatory Visit: Payer: Self-pay

## 2021-08-13 ENCOUNTER — Ambulatory Visit (INDEPENDENT_AMBULATORY_CARE_PROVIDER_SITE_OTHER): Payer: Medicaid Other | Admitting: *Deleted

## 2021-08-13 DIAGNOSIS — L03119 Cellulitis of unspecified part of limb: Secondary | ICD-10-CM | POA: Diagnosis not present

## 2021-08-13 DIAGNOSIS — L02419 Cutaneous abscess of limb, unspecified: Secondary | ICD-10-CM | POA: Diagnosis not present

## 2021-08-13 DIAGNOSIS — Z7689 Persons encountering health services in other specified circumstances: Secondary | ICD-10-CM | POA: Diagnosis not present

## 2021-08-13 NOTE — Progress Notes (Signed)
  Patient was in for bilateral unna boot change.  Swelling had improved from last week however, wounds are still draining somewhat.  Patient is returning next week for wound check and unna boot dressings.

## 2021-08-17 ENCOUNTER — Other Ambulatory Visit: Payer: Self-pay | Admitting: Physician Assistant

## 2021-08-17 DIAGNOSIS — L03119 Cellulitis of unspecified part of limb: Secondary | ICD-10-CM

## 2021-08-17 DIAGNOSIS — L02419 Cutaneous abscess of limb, unspecified: Secondary | ICD-10-CM

## 2021-08-24 ENCOUNTER — Ambulatory Visit (INDEPENDENT_AMBULATORY_CARE_PROVIDER_SITE_OTHER): Payer: Medicaid Other | Admitting: *Deleted

## 2021-08-24 ENCOUNTER — Other Ambulatory Visit: Payer: Self-pay

## 2021-08-24 ENCOUNTER — Encounter: Payer: Self-pay | Admitting: *Deleted

## 2021-08-24 VITALS — BP 148/80 | HR 75 | Temp 98.1°F

## 2021-08-24 DIAGNOSIS — I872 Venous insufficiency (chronic) (peripheral): Secondary | ICD-10-CM

## 2021-08-24 NOTE — Progress Notes (Signed)
Unna boot dressing applied to bilateral legs. Right leg is still swollen but all wounds have healed. Left leg has 3 small ulcers on the anterior lower leg that appear to be improving. Pt had removed previous unna boot himself after missing his dressing change appt due to transportation.

## 2021-08-25 DIAGNOSIS — B029 Zoster without complications: Secondary | ICD-10-CM | POA: Diagnosis not present

## 2021-08-25 DIAGNOSIS — K5903 Drug induced constipation: Secondary | ICD-10-CM | POA: Diagnosis not present

## 2021-08-25 DIAGNOSIS — Z7689 Persons encountering health services in other specified circumstances: Secondary | ICD-10-CM | POA: Diagnosis not present

## 2021-09-02 ENCOUNTER — Other Ambulatory Visit: Payer: Self-pay

## 2021-09-02 ENCOUNTER — Ambulatory Visit (INDEPENDENT_AMBULATORY_CARE_PROVIDER_SITE_OTHER): Payer: Medicaid Other | Admitting: *Deleted

## 2021-09-02 DIAGNOSIS — L03119 Cellulitis of unspecified part of limb: Secondary | ICD-10-CM | POA: Diagnosis not present

## 2021-09-02 DIAGNOSIS — Z7689 Persons encountering health services in other specified circumstances: Secondary | ICD-10-CM | POA: Diagnosis not present

## 2021-09-02 DIAGNOSIS — L02419 Cutaneous abscess of limb, unspecified: Secondary | ICD-10-CM | POA: Diagnosis not present

## 2021-09-02 NOTE — Progress Notes (Signed)
Patient arrived bilateral unna boots removed so that he could shower before application of new dressings.  Wounds appears to show signs of improvement per patient.  Applied bilateral unna boots.  Patient requested to have PA visit next week as he has questions as to what comes next.  Dr Gae Gallop was physician in office.

## 2021-09-07 DIAGNOSIS — Z419 Encounter for procedure for purposes other than remedying health state, unspecified: Secondary | ICD-10-CM | POA: Diagnosis not present

## 2021-09-09 ENCOUNTER — Ambulatory Visit (INDEPENDENT_AMBULATORY_CARE_PROVIDER_SITE_OTHER): Payer: Medicaid Other | Admitting: *Deleted

## 2021-09-09 ENCOUNTER — Other Ambulatory Visit: Payer: Self-pay

## 2021-09-09 DIAGNOSIS — L02419 Cutaneous abscess of limb, unspecified: Secondary | ICD-10-CM

## 2021-09-09 DIAGNOSIS — L02416 Cutaneous abscess of left lower limb: Secondary | ICD-10-CM | POA: Diagnosis not present

## 2021-09-09 DIAGNOSIS — L03116 Cellulitis of left lower limb: Secondary | ICD-10-CM | POA: Diagnosis not present

## 2021-09-09 DIAGNOSIS — L03119 Cellulitis of unspecified part of limb: Secondary | ICD-10-CM | POA: Diagnosis not present

## 2021-09-09 NOTE — Progress Notes (Signed)
Patient presents today for [employer] change. Patient had bilateral Unna boots prior to today. After evaluation right leg is completely healed and Unna boot was discontinued. Left leg has 3 scabbed areas on shin area. Patient has red discoloration to LLE with toes a purplish color. Patient instructed to elevate legs above the heart when at home. Dopplar was used and patient had detectable pulse in left foot. Reappled Unna boot to LLE and compression sock to RLE. Scheduled patient to be seen by Dr Trula Slade on 09/13/21 . Patient verbalized understanding of all instructions.

## 2021-09-13 ENCOUNTER — Other Ambulatory Visit: Payer: Self-pay

## 2021-09-13 ENCOUNTER — Encounter: Payer: Self-pay | Admitting: Surgery

## 2021-09-13 ENCOUNTER — Ambulatory Visit (INDEPENDENT_AMBULATORY_CARE_PROVIDER_SITE_OTHER): Payer: Medicaid Other | Admitting: Surgery

## 2021-09-13 VITALS — BP 134/84 | HR 63 | Temp 98.1°F | Resp 20 | Ht 71.0 in | Wt 265.0 lb

## 2021-09-13 DIAGNOSIS — L03119 Cellulitis of unspecified part of limb: Secondary | ICD-10-CM | POA: Diagnosis not present

## 2021-09-13 DIAGNOSIS — L02419 Cutaneous abscess of limb, unspecified: Secondary | ICD-10-CM | POA: Diagnosis not present

## 2021-09-13 MED ORDER — SULFAMETHOXAZOLE-TRIMETHOPRIM 400-80 MG PO TABS: 1.0000 | ORAL_TABLET | Freq: Two times a day (BID) | ORAL | 0 refills | Status: DC

## 2021-09-13 NOTE — Progress Notes (Signed)
[facility]in Specialist of [facility]  Patient name: [name] [name] MRN: [id number] DOB: [birth year] Sex: male   REASON FOR VISIT:    Follow up  HISOTRY OF PRESENT ILLNESS:    [name] [name] is a 60 y.o. male who was initially seen in [facility] on 08/06/2021 with left leg swelling and a wound that has been present for several months which was associated with a injury.  He had a Doppler study which was negative for DVT.  He has been treated with diuretics and antibiotics.  He has been recently wearing socks from Dr.Duda.  He states the drainage has significantly decreased and the ulcers are improving.  The patient has a history of a left leg DVT involving the popliteal, posterior tibial, and peroneal veins, as well as a PE in 2015.  He is currently on Xarelto.  He has been using Unna boots to help with his edema.  Patient has a history of tobacco abuse and suffers from COPD.  He has had a stroke in the past.  He is medically managed for hypertension.   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   onset 02/14/14   PE (pulmonary embolism)    DVT left leg   Status post spinal surgery    Stroke [facility])    Tobacco abuse      FAMILY HISTORY:   Family History  Problem Relation Age of Onset   Cancer Mother    Depression Sister     SOCIAL HISTORY:   Social History   Tobacco Use   Smoking status: Every [place]    Packs/Narayan: 1.50    Types: Cigarettes   Smokeless tobacco: Never   Tobacco comments:    increased to 2ppd  Substance Use Topics   Alcohol use: No    Alcohol/week: 0.0 standard drinks    Comment: former     ALLERGIES:   Allergies  Allergen Reactions   Amlodipine Swelling    BLE peripheral edema     Fentanyl     Per pt he had a near overdose on these due to "regulating body temp"   Methadone Itching     CURRENT MEDICATIONS:   Current Outpatient Medications  Medication Sig Dispense Refill   albuterol (PROVENTIL HFA;VENTOLIN HFA) 108 (90 BASE) MCG/ACT inhaler Inhale 2 puffs into the lungs every 4 (four) hours as needed for wheezing. Reported on 04/11/2016     amLODipine (NORVASC) 10 MG tablet Take 1 tablet (10 mg total) by mouth daily. 90 tablet 3   atorvastatin (LIPITOR) 80 MG tablet TAKE 1 TABLET (80 MG TOTAL) BY MOUTH DAILY AT 6 PM. (Patient taking differently: Take 80 mg by mouth daily.) 90 tablet 1   Azelastine HCl 137 MCG/SPRAY SOLN Place 1 spray into both nostrils 2 (two) times daily.     finasteride (PROSCAR) 5 MG tablet Take 1 tablet (5 mg total) by mouth daily. 30 tablet 5   furosemide (LASIX) 20 MG tablet Take 20 mg by mouth in the morning and at bedtime.     lisinopril (PRINIVIL,ZESTRIL) 40 MG tablet Take 1 tablet (40 mg total) by mouth daily. 90 tablet 3   pregabalin (LYRICA) 300 MG capsule Take 1 capsule (300 mg total) by  mouth 2 (two) times daily. (Patient taking differently: Take 150 mg by mouth in the morning, at noon, in the evening, and at bedtime.) 60 capsule 1   propranolol (INDERAL) 20 MG tablet Take 1 tablet (20 mg total) by mouth 3 (three) times daily. 90 tablet 1   rivaroxaban (XARELTO) 20 MG TABS tablet TAKE 1 TABLET BY MOUTH EVERY Ramirez WITH SUPPER (Patient taking differently: Take 20 mg by mouth daily with supper.) 30 tablet 2   SUBOXONE 8-2 MG FILM Place 1 strip under the tongue 3 (three) times daily.     venlafaxine XR (EFFEXOR-XR) 150 MG 24 hr capsule TAKE ONE CAPSULE BY MOUTH EVERY [place] (Patient taking differently: Take 150 mg by mouth daily with breakfast.) 30 capsule 4   No current facility-administered medications for this visit.    REVIEW OF SYSTEMS:   [X]  denotes positive finding, [ ]  denotes negative finding Cardiac  Comments:  Chest pain or chest  pressure:    Shortness of breath upon exertion:    Short of breath when lying flat:    Irregular heart rhythm:        Vascular    Pain in calf, thigh, or hip brought on by ambulation:    Pain in feet at night that wakes you up from your sleep:     Blood clot in your veins:    Leg swelling:  x       Pulmonary    Oxygen at home:    Productive cough:     Wheezing:         Neurologic    Sudden weakness in arms or legs:     Sudden numbness in arms or legs:     Sudden onset of difficulty speaking or slurred speech:    Temporary loss of vision in one eye:     Problems with dizziness:         Gastrointestinal    Blood in stool:     Vomited blood:         Genitourinary    Burning when urinating:     Blood in urine:        Psychiatric    Major depression:         Hematologic    Bleeding problems:    Problems with blood clotting too easily:        Skin    Rashes or ulcers: x       Constitutional    Fever or chills:      PHYSICAL EXAM:   Vitals:   09/13/21 0905  BP: 134/84  Pulse: 63  Resp: 20  Temp: 98.1 F (36.7 C)  SpO2: 93%  Weight: 265 lb (120.2 kg)  Height: 5\' 11"  (1.803 m)    GENERAL: The patient is a well-nourished male, in no acute distress. The vital signs are documented above. CARDIAC: There is a regular rate and rhythm.  VASCULAR: Brisk posterior tibial and peroneal Doppler signals PULMONARY: Non-labored respirations ABDOMEN: Soft and non-tender with normal pitched bowel sounds.  MUSCULOSKELETAL: There are no major deformities or cyanosis. NEUROLOGIC: No focal weakness or paresthesias are detected. SKIN: See photo below PSYCHIATRIC: The patient has a normal affect.   STUDIES:   I have reviewed his reflux study with the following findings:  +------------------+---------+------+-----------+------------+--------+  LEFT              Reflux NoRefluxReflux TimeDiameter cmsComments  Yes                                    +------------------+---------+------+-----------+------------+--------+  CFV                         yes   >1 second                       +------------------+---------+------+-----------+------------+--------+  Popliteal                   yes   >1 second                       +------------------+---------+------+-----------+------------+--------+  GSV at SFJ                  yes    >500 ms      0.98              +------------------+---------+------+-----------+------------+--------+  GSV prox thigh    no                            0.68              +------------------+---------+------+-----------+------------+--------+  GSV mid thigh               yes    >500 ms      0.54              +------------------+---------+------+-----------+------------+--------+  GSV dist thigh    no                            0.51              +------------------+---------+------+-----------+------------+--------+  GSV at knee       no                            0.38              +------------------+---------+------+-----------+------------+--------+  SSV Pop Fossa     no                            0.45              +------------------+---------+------+-----------+------------+--------+  anterior accessoryno                            0.46              +------------------+---------+------+-----------+------------+--------+       MEDICAL ISSUES:   CEAP class 6, left leg: The patient is ulcers are improving, however I do think he has residual cellulitis.  I am going to call him and another 2 weeks worth of Bactrim.  I have encouraged him to continue to wear his compression socks and keep his legs elevated.  He will follow-up in 3 months for wound check.  At that visit hopefully his wound will be healed and we can get him fitted for a new pair of compression socks.  Patient has a history of a left leg DVT with significant popliteal and  femoral vein reflux.  Therefore, I do not know how beneficial saphenous vein  ablation will be.  I would recommend compression and elevation as is primary treatment modality.    Leia Alf, MD, FACS [facility] [phone] Pager [phone]

## 2021-09-22 DIAGNOSIS — K5903 Drug induced constipation: Secondary | ICD-10-CM | POA: Diagnosis not present

## 2021-09-22 DIAGNOSIS — B029 Zoster without complications: Secondary | ICD-10-CM | POA: Diagnosis not present

## 2021-09-27 ENCOUNTER — Telehealth: Payer: Self-pay

## 2021-09-27 NOTE — Telephone Encounter (Signed)
Patient recently seen by [facility] in clinic. Venous ulcers are worsening and he is having a lot of pain and swelling has gotten much worse in his left leg. The sores are oozing. Discussed with PA, advised patient to continue elevation and compression. Placed patient on schedule for probably application of UNNA boots and working on wound care referral.

## 2021-09-28 ENCOUNTER — Other Ambulatory Visit: Payer: Self-pay

## 2021-09-28 ENCOUNTER — Encounter (HOSPITAL_BASED_OUTPATIENT_CLINIC_OR_DEPARTMENT_OTHER): Payer: Medicaid Other | Attending: Internal Medicine | Admitting: Internal Medicine

## 2021-09-28 DIAGNOSIS — I87313 Chronic venous hypertension (idiopathic) with ulcer of bilateral lower extremity: Secondary | ICD-10-CM | POA: Insufficient documentation

## 2021-09-28 DIAGNOSIS — J449 Chronic obstructive pulmonary disease, unspecified: Secondary | ICD-10-CM | POA: Diagnosis not present

## 2021-09-28 DIAGNOSIS — F17210 Nicotine dependence, cigarettes, uncomplicated: Secondary | ICD-10-CM | POA: Diagnosis not present

## 2021-09-28 DIAGNOSIS — L03116 Cellulitis of left lower limb: Secondary | ICD-10-CM | POA: Insufficient documentation

## 2021-09-28 DIAGNOSIS — L97812 Non-pressure chronic ulcer of other part of right lower leg with fat layer exposed: Secondary | ICD-10-CM | POA: Diagnosis not present

## 2021-09-28 DIAGNOSIS — Z86718 Personal history of other venous thrombosis and embolism: Secondary | ICD-10-CM | POA: Insufficient documentation

## 2021-09-28 DIAGNOSIS — L97822 Non-pressure chronic ulcer of other part of left lower leg with fat layer exposed: Secondary | ICD-10-CM | POA: Diagnosis not present

## 2021-09-28 DIAGNOSIS — I10 Essential (primary) hypertension: Secondary | ICD-10-CM | POA: Diagnosis not present

## 2021-09-28 NOTE — Progress Notes (Signed)
[name] [name], [name] [name] ([id number]) Visit Report for 09/28/2021 Abuse/Suicide Risk Screen Details Patient Name: Date of Service: [name] [name] 09/28/2021 7:30 A M Medical Record Number: [id number] Patient Account Number: [id number] Date of Birth/Sex: Treating RN: 1961-04-12 (60 y.o. [name] Primary Care Jameis Newsham: Fonnie Jarvis Other Clinician: Referring Hazyl Marseille: Treating Zahid Carneiro/Extender: Dorann Ou in Treatment: 0 Abuse/Suicide Risk Screen Items Answer ABUSE RISK SCREEN: Has anyone close to you tried to hurt or harm you recentlyo No Do you feel uncomfortable with anyone in your familyo No Has anyone forced you do things that you didnt want to doo No Electronic Signature(s) Signed: 09/28/2021 5:20:05 PM By: Deon Pilling RN, BSN Entered By: Deon Pilling on 09/28/2021 08:01:50 -------------------------------------------------------------------------------- Activities of Daily Living Details Patient Name: Date of Service: [name] [name] 09/28/2021 7:30 [place] Record Number: [id number] Patient Account Number: [id number] Date of Birth/Sex: Treating RN: Apr 01, 1961 (60 y.o. [name] Primary Care Arika Mainer: Fonnie Jarvis Other Clinician: Referring Lashae Wollenberg: Treating Belladonna Lubinski/Extender: Dorann Ou in Treatment: 0 Activities of Daily Living Items Answer Activities of Daily Living (Please select one for each item) Drive Automobile Completely Able T Medications ake Completely Able Use T elephone Completely Able Care for Appearance Completely Able Use T oilet Completely Able Bath / Shower Completely Able Dress Self Completely Able Feed Self Completely Able Walk Need Assistance Get In / Out Bed Completely Able Housework Completely Able Prepare Meals Completely [place] for Self Completely Able Electronic Signature(s) Signed: 09/28/2021 5:20:05 PM By: Deon Pilling RN,  BSN Entered By: Deon Pilling on 09/28/2021 07:56:18 -------------------------------------------------------------------------------- Education Screening Details Patient Name: Date of Service: [name] [name] 09/28/2021 7:30 A M Medical Record Number: [id number] Patient Account Number: [id number] Date of Birth/Sex: Treating RN: 07/10/1961 (60 y.o. [name] Primary Care Kaneesha Constantino: Fonnie Jarvis Other Clinician: Referring Truly Stankiewicz: Treating Alenah Sarria/Extender: Dorann Ou in Treatment: 0 Primary Learner Assessed: Patient Learning Preferences/Education Level/Primary Language Learning Preference: Explanation, Demonstration, Printed Material Highest Education Level: High School Preferred Language: English Cognitive Barrier Language Barrier: No Translator Needed: No Memory Deficit: No Emotional Barrier: No Cultural/Religious Beliefs Affecting Medical Care: No Physical Barrier Impaired Vision: Yes Glasses Impaired Hearing: No Decreased Hand dexterity: No Knowledge/Comprehension Knowledge Level: High Comprehension Level: High Ability to understand written instructions: High Ability to understand verbal instructions: High Motivation Anxiety Level: Calm Cooperation: Cooperative Education Importance: Acknowledges Need Interest in Health Problems: Asks Questions Perception: Coherent Willingness to Engage in Self-Management High Activities: Readiness to Engage in Self-Management High Activities: Electronic Signature(s) Signed: 09/28/2021 5:20:05 PM By: Deon Pilling RN, BSN Entered By: Deon Pilling on 09/28/2021 07:56:26 -------------------------------------------------------------------------------- Fall Risk Assessment Details Patient Name: Date of Service: [name] [name], [name] [name] 09/28/2021 7:30 A M Medical Record Number: [id number] Patient Account Number: [id number] Date of Birth/Sex: Treating RN: 07-Jan-1961 (60 y.o. [name] Primary  Care Dodi Leu: Fonnie Jarvis Other Clinician: Referring Cande Mastropietro: Treating Keymon Mcelroy/Extender: Dorann Ou in Treatment: 0 Fall Risk Assessment Items Have you had 2 or more falls in the last 12 monthso 0 Yes Have you had any fall that resulted in injury in the last 12 monthso 0 Yes FALLS RISK SCREEN History of falling - immediate or within 3 months 25 Yes Secondary diagnosis (Do you have 2 or more medical diagnoseso) 0 No Ambulatory aid None/bed rest/wheelchair/nurse 0 Yes Crutches/cane/walker 15 Yes Furniture 0 No Intravenous therapy Access/Saline/Heparin Lock 0 No Gait/Transferring Normal/ bed rest/ wheelchair 0 No  Weak (short steps with or without shuffle, stooped but able to lift head while walking, may seek 10 Yes support from furniture) Impaired (short steps with shuffle, may have difficulty arising from chair, head down, impaired 0 No balance) Mental Status Oriented to own ability 0 Yes Electronic Signature(s) Signed: 09/28/2021 5:20:05 PM By: Deon Pilling RN, BSN Entered By: Deon Pilling on 09/28/2021 07:56:38 -------------------------------------------------------------------------------- Foot Assessment Details Patient Name: Date of Service: [name] [name] 09/28/2021 7:30 [place] Record Number: [id number] Patient Account Number: [id number] Date of Birth/Sex: Treating RN: [birth year] (60 y.o. [name] Primary Care Ranyah Groeneveld: Fonnie Jarvis Other Clinician: Referring Lynnwood Beckford: Treating Ryelle Ruvalcaba/Extender: Dorann Ou in Treatment: 0 Foot Assessment Items Site Locations + = Sensation present, - = Sensation absent, C = Callus, U = Ulcer R = Redness, W = Warmth, M = Maceration, PU = Pre-ulcerative lesion F = Fissure, S = Swelling, D = Dryness Assessment Right: Left: Other Deformity: No No Prior Foot Ulcer: No No Prior Amputation: No No Charcot Joint: No No Ambulatory Status: Ambulatory  With Help Assistance Device: Walker GaitEnergy manager) Signed: 09/28/2021 5:20:05 PM By: Deon Pilling RN, BSN Entered By: Deon Pilling on 09/28/2021 08:04:51 -------------------------------------------------------------------------------- Nutrition Risk Screening Details Patient Name: Date of Service: [name] [name] 09/28/2021 7:30 [place] Record Number: [id number] Patient Account Number: [id number] Date of Birth/Sex: Treating RN: 25-Nov-1960 (60 y.o. [name] Primary Care Jayson Waterhouse: Fonnie Jarvis Other Clinician: Referring Vitalia Stough: Treating Maudie Shingledecker/Extender: Dorann Ou in Treatment: 0 Height (in): 71 Weight (lbs): 216 Body Mass Index (BMI): 30.1 Nutrition Risk Screening Items Score Screening NUTRITION RISK SCREEN: I have an illness or condition that made me change the kind and/or amount of food I eat 2 Yes I eat fewer than two meals per [place] 0 No I eat few fruits and vegetables, or milk products 0 No I have three or more drinks of beer, liquor or wine almost every [place] 0 No I have tooth or mouth problems that make it hard for me to eat 0 No I don't always have enough money to buy the food I need 0 No I eat alone most of the time 0 No I take three or more different prescribed or over-the-counter drugs a [place] 1 Yes Without wanting to, I have lost or gained 10 pounds in the last six months 0 No I am not always physically able to shop, cook and/or feed myself 0 No Nutrition Protocols Good Risk Protocol Moderate Risk Protocol 0 Provide education on nutrition High Risk Proctocol Risk Level: Moderate Risk Score: 3 Electronic Signature(s) Signed: 09/28/2021 5:20:05 PM By: Deon Pilling RN, BSN Entered By: Deon Pilling on 09/28/2021 07:57:05

## 2021-09-28 NOTE — Progress Notes (Signed)
[name] [name], [name] [name] ([id number]) Visit Report for 09/28/2021 Allergy List Details Patient Name: Date of Service: [name] [name] 09/28/2021 7:30 A M Medical Record Number: [id number] Patient Account Number: [id number] Date of Birth/Sex: Treating RN: 09-Aug-1961 (60 y.o. [name] Primary Care Taijah Macrae: Fonnie Jarvis Other Clinician: Referring Kween Bacorn: Treating Marshea Wisher/Extender: Dorann Ou in Treatment: 0 Allergies Active Allergies amlodipine Reaction: swelling fentanyl Reaction: near overdose r/t regulating body temperature methadone Reaction: ithcing Allergy Notes Electronic Signature(s) Signed: 09/28/2021 5:20:05 PM By: Deon Pilling RN, BSN Entered By: Deon Pilling on 09/28/2021 07:55:32 -------------------------------------------------------------------------------- Arrival Information Details Patient Name: Date of Service: [name] [name] 09/28/2021 7:30 [place] Record Number: [id number] Patient Account Number: [id number] Date of Birth/Sex: Treating RN: [birth year] (60 y.o. [name] Primary Care Jouri Threat: Fonnie Jarvis Other Clinician: Referring Dois Juarbe: Treating Kameryn Tisdel/Extender: Dorann Ou in Treatment: 0 Visit Information Patient Arrived: Wheel Chair Arrival Time: 07:49 Accompanied By: self Transfer Assistance: Manual Patient Identification Verified: Yes Secondary Verification Process Completed: Yes Patient Requires Transmission-Based Precautions: No Patient Has Alerts: Yes Patient Alerts: Patient on Blood Thinner Electronic Signature(s) Signed: 09/28/2021 5:20:05 PM By: Deon Pilling RN, BSN Entered By: Deon Pilling on 09/28/2021 07:57:54 -------------------------------------------------------------------------------- Clinic Level of Care Assessment Details Patient Name: Date of Service: [name] [name] 09/28/2021 7:30 [place] Record Number: [id number] Patient Account Number:  [id number] Date of Birth/Sex: Treating RN: 23-Apr-1961 (60 y.o. [name] Primary Care Iris Hairston: Fonnie Jarvis Other Clinician: Referring Aldene Hendon: Treating Anarosa Kubisiak/Extender: Dorann Ou in Treatment: 0 Clinic Level of Care Assessment Items TOOL 1 Quantity Score X- 1 0 Use when EandM and Procedure is performed on INITIAL visit ASSESSMENTS - Nursing Assessment / Reassessment X- 1 20 General Physical Exam (combine w/ comprehensive assessment (listed just below) when performed on new pt. evals) X- 1 25 Comprehensive Assessment (HX, ROS, Risk Assessments, Wounds Hx, etc.) ASSESSMENTS - Wound and Skin Assessment / Reassessment X- 1 10 Dermatologic / Skin Assessment (not related to wound area) ASSESSMENTS - Ostomy and/or Continence Assessment and Care '[]'  - 0 Incontinence Assessment and Management '[]'  - 0 Ostomy Care Assessment and Management (repouching, etc.) PROCESS - Coordination of Care '[]'  - 0 Simple Patient / Family Education for ongoing care X- 1 20 Complex (extensive) Patient / Family Education for ongoing care X- 1 10 Staff obtains Programmer, systems, Records, T Results / Process Orders est '[]'  - 0 Staff telephones HHA, Nursing Homes / Clarify orders / etc '[]'  - 0 Routine Transfer to another Facility (non-emergent condition) '[]'  - 0 Routine Hospital Admission (non-emergent condition) X- 1 15 New Admissions / Biomedical engineer / Ordering NPWT Apligraf, etc. , '[]'  - 0 Emergency Hospital Admission (emergent condition) PROCESS - Special Needs '[]'  - 0 Pediatric / Minor Patient Management '[]'  - 0 Isolation Patient Management '[]'  - 0 Hearing / Language / Visual special needs '[]'  - 0 Assessment of Community assistance (transportation, D/C planning, etc.) '[]'  - 0 Additional assistance / Altered mentation '[]'  - 0 Support Surface(s) Assessment (bed, cushion, seat, etc.) INTERVENTIONS - Miscellaneous '[]'  - 0 External ear exam '[]'  - 0 Patient  Transfer (multiple staff / Civil Service fast streamer / Similar devices) '[]'  - 0 Simple Staple / Suture removal (25 or less) '[]'  - 0 Complex Staple / Suture removal (26 or more) '[]'  - 0 Hypo/Hyperglycemic Management (do not check if billed separately) X- 1 15 Ankle / Brachial Index (ABI) - do not check if billed separately Has the patient been  seen at the hospital within the last three years: Yes Total Score: 115 Level Of Care: New/Established - Level 3 Electronic Signature(s) Signed: 09/28/2021 5:20:05 PM By: Deon Pilling RN, BSN Signed: 09/28/2021 5:20:05 PM By: Deon Pilling RN, BSN Entered By: Deon Pilling on 09/28/2021 08:34:10 -------------------------------------------------------------------------------- Compression Therapy Details Patient Name: Date of Service: [name] [name] 09/28/2021 7:30 [place] Record Number: [id number] Patient Account Number: [id number] Date of Birth/Sex: Treating RN: 1961-05-04 (60 y.o. [name] Primary Care Sheryle Vice: Fonnie Jarvis Other Clinician: Referring Khloei Spiker: Treating Ludell Zacarias/Extender: Dorann Ou in Treatment: 0 Compression Therapy Performed for Wound Assessment: Wound #1 Left,Anterior Lower Leg Performed By: Clinician Deon Pilling, RN Compression Type: Three Layer Pre Treatment ABI: 1 Post Procedure Diagnosis Same as Pre-procedure Electronic Signature(s) Signed: 09/28/2021 5:20:05 PM By: Deon Pilling RN, BSN Entered By: Deon Pilling on 09/28/2021 08:32:28 -------------------------------------------------------------------------------- Compression Therapy Details Patient Name: Date of Service: [name] [name] 09/28/2021 7:30 [place] Record Number: [id number] Patient Account Number: [id number] Date of Birth/Sex: Treating RN: 05/30/1961 (60 y.o. [name] Primary Care Latisia Hilaire: Fonnie Jarvis Other Clinician: Referring Josias Tomerlin: Treating Orley Lawry/Extender: Dorann Ou in Treatment: 0 Compression Therapy Performed for Wound Assessment: Wound #2 Left,Medial Lower Leg Performed By: Clinician Deon Pilling, RN Compression Type: Three Layer Pre Treatment ABI: 1 Post Procedure Diagnosis Same as Pre-procedure Electronic Signature(s) Signed: 09/28/2021 5:20:05 PM By: Deon Pilling RN, BSN Entered By: Deon Pilling on 09/28/2021 08:32:28 -------------------------------------------------------------------------------- Compression Therapy Details Patient Name: Date of Service: [name] [name] 09/28/2021 7:30 [place] Record Number: [id number] Patient Account Number: [id number] Date of Birth/Sex: Treating RN: 1961/04/18 (60 y.o. [name] Primary Care Telesa Jeancharles: Fonnie Jarvis Other Clinician: Referring Mattisyn Cardona: Treating Wessie Shanks/Extender: Dorann Ou in Treatment: 0 Compression Therapy Performed for Wound Assessment: Wound #3 Right,Anterior Lower Leg Performed By: Clinician Deon Pilling, RN Compression Type: Three Layer Pre Treatment ABI: 1 Post Procedure Diagnosis Same as Pre-procedure Electronic Signature(s) Signed: 09/28/2021 5:20:05 PM By: Deon Pilling RN, BSN Entered By: Deon Pilling on 09/28/2021 08:32:28 -------------------------------------------------------------------------------- Encounter Discharge Information Details Patient Name: Date of Service: [name] [name] 09/28/2021 7:30 [place] Record Number: [id number] Patient Account Number: [id number] Date of Birth/Sex: Treating RN: Mar 12, 1961 (60 y.o. [name] Primary Care Kaybree Williams: Fonnie Jarvis Other Clinician: Referring Travaris Kosh: Treating Taylee Gunnells/Extender: Dorann Ou in Treatment: 0 Encounter Discharge Information Items Post Procedure Vitals Discharge Condition: Stable Temperature (F): 98.2 Ambulatory Status: Walker Pulse (bpm): 68 Discharge Destination: Home Respiratory Rate  (breaths/min): 20 Transportation: Private Auto Blood Pressure (mmHg): 179/83 Accompanied By: self Schedule Follow-up Appointment: Yes Clinical Summary of Care: Electronic Signature(s) Signed: 09/28/2021 5:20:05 PM By: Deon Pilling RN, BSN Entered By: Deon Pilling on 09/28/2021 08:36:02 -------------------------------------------------------------------------------- Lower Extremity Assessment Details Patient Name: Date of Service: [name] [name] 09/28/2021 7:30 A M Medical Record Number: [id number] Patient Account Number: [id number] Date of Birth/Sex: Treating RN: 1960-11-26 (60 y.o. [name], [name] Primary Care Saulo Anthis: Fonnie Jarvis Other Clinician: Referring Lorisa Scheid: Treating Yoshio Seliga/Extender: Aloha Gell Weeks in Treatment: 0 Edema Assessment Assessed: [name]: Yes] Patrice Paradise: Yes] Edema: [Left: Yes] [Right: Yes] Calf Left: Right: Point of Measurement: 34 cm From Medial Instep 49 cm 47 cm Ankle Left: Right: Point of Measurement: 9 cm From Medial Instep 29 cm 28 cm Knee To Floor Left: Right: From Medial Instep 44 cm 44 cm Vascular Assessment Pulses: Dorsalis Pedis Palpable: [Left:Yes] [Right:Yes] Posterior Tibial Palpable: [  Left:Yes] [Right:Yes] Blood Pressure: Brachial: [Left:179] Ankle: [Left:Dorsalis Pedis: 180 1.01] [Right:Dorsalis Pedis: 179 1.00] Electronic Signature(s) Signed: 09/28/2021 5:20:05 PM By: Deon Pilling RN, BSN Entered By: Deon Pilling on 09/28/2021 08:11:50 -------------------------------------------------------------------------------- Multi Wound Chart Details Patient Name: Date of Service: [name] [name] 09/28/2021 7:30 [place] Record Number: [id number] Patient Account Number: [id number] Date of Birth/Sex: Treating RN: 09-12-1961 (60 y.o. [name], [name] Primary Care Angeliki Mates: Fonnie Jarvis Other Clinician: Referring Cumi Sanagustin: Treating Loretta Kluender/Extender: Aloha Gell Weeks  in Treatment: 0 Vital Signs Height(in): 71 Pulse(bpm): 67 Weight(lbs): 216 Blood Pressure(mmHg): 179/83 Body Mass Index(BMI): 30 Temperature(F): 98.2 Respiratory Rate(breaths/min): 18 Photos: Left, Anterior Lower Leg Left, Medial Lower Leg Right, Anterior Lower Leg Wound Location: Gradually Appeared Gradually Appeared Gradually Appeared Wounding Event: Venous Leg Ulcer Venous Leg Ulcer Venous Leg Ulcer Primary Etiology: Chronic Obstructive Pulmonary Chronic Obstructive Pulmonary Chronic Obstructive Pulmonary Comorbid History: Disease (COPD), Deep Vein Disease (COPD), Deep Vein Disease (COPD), Deep Vein Thrombosis, Hypertension, Peripheral Thrombosis, Hypertension, Peripheral Thrombosis, Hypertension, Peripheral Venous Disease, Neuropathy Venous Disease, Neuropathy Venous Disease, Neuropathy 09/14/2021 09/14/2021 09/14/2021 Date Acquired: 0 0 0 Weeks of Treatment: Open Open Open Wound Status: 3.4x2.4x0.1 1.1x0.6x0.1 1.3x0.5x0.1 Measurements L x W x D (cm) 6.409 0.518 0.511 A (cm) : rea 0.641 0.052 0.051 Volume (cm) : 0.00% 0.00% 0.00% % Reduction in Area: 0.00% 0.00% 0.00% % Reduction in Volume: Full Thickness Without Exposed Full Thickness Without Exposed Full Thickness Without Exposed Classification: Support Structures Support Structures Support Structures Medium Medium Medium Exudate Amount: Serosanguineous Serosanguineous Serosanguineous Exudate Type: red, brown red, brown red, brown Exudate Color: Distinct, outline attached Distinct, outline attached Distinct, outline attached Wound Margin: Large (67-100%) Large (67-100%) Large (67-100%) Granulation Amount: Red, Pink Red, Pink Red, Pink Granulation Quality: Small (1-33%) Small (1-33%) Small (1-33%) Necrotic Amount: Fat Layer (Subcutaneous Tissue): Yes Fat Layer (Subcutaneous Tissue): Yes Fat Layer (Subcutaneous Tissue): Yes Exposed Structures: Fascia: No Fascia: No Fascia: No Tendon: No Tendon:  No Tendon: No Muscle: No Muscle: No Muscle: No Joint: No Joint: No Joint: No Bone: No Bone: No Bone: No Small (1-33%) Small (1-33%) N/A Epithelialization: Debridement - Excisional N/A Debridement - Excisional Debridement: Pre-procedure Verification/Time Out 08:25 N/A 08:25 Taken: Other N/A Other Pain Control: Subcutaneous, Slough N/A Subcutaneous, Slough Tissue Debrided: Skin/Subcutaneous Tissue N/A Skin/Subcutaneous Tissue Level: 8.16 N/A 0.65 Debridement A (sq cm): rea Curette N/A Curette Instrument: Minimum N/A Minimum Bleeding: Pressure N/A Pressure Hemostasis A chieved: 0 N/A 0 Procedural Pain: 0 N/A 0 Post Procedural Pain: Procedure was tolerated well N/A Procedure was tolerated well Debridement Treatment Response: 3.4x2.4x0.1 N/A 1.3x0.5x0.1 Post Debridement Measurements L x W x D (cm) 0.641 N/A 0.051 Post Debridement Volume: (cm) Compression Therapy Compression Therapy Compression Therapy Procedures Performed: Debridement Debridement Treatment Notes Wound #1 (Lower Leg) Wound Laterality: Left, Anterior Cleanser Soap and Water Discharge Instruction: May shower and wash wound with dial antibacterial soap and water prior to dressing change. Wound Cleanser Discharge Instruction: Cleanse the wound with wound cleanser prior to applying a clean dressing using gauze sponges, not tissue or cotton balls. Peri-Wound Care Sween Lotion (Moisturizing lotion) Discharge Instruction: Apply moisturizing lotion as directed Topical Primary Dressing KerraCel Ag Gelling Fiber Dressing, 2x2 in (silver alginate) Discharge Instruction: Apply silver alginate to wound bed as instructed Secondary Dressing Woven Gauze Sponge, Non-Sterile 4x4 in Discharge Instruction: Apply over primary dressing as directed. Secured With Compression Wrap ThreePress (3 layer compression wrap) Discharge Instruction: Apply three layer compression as directed. Compression  Stockings Add-Ons Wound #2 (  Lower Leg) Wound Laterality: Left, Medial Cleanser Soap and Water Discharge Instruction: May shower and wash wound with dial antibacterial soap and water prior to dressing change. Wound Cleanser Discharge Instruction: Cleanse the wound with wound cleanser prior to applying a clean dressing using gauze sponges, not tissue or cotton balls. Peri-Wound Care Sween Lotion (Moisturizing lotion) Discharge Instruction: Apply moisturizing lotion as directed Topical Primary Dressing KerraCel Ag Gelling Fiber Dressing, 2x2 in (silver alginate) Discharge Instruction: Apply silver alginate to wound bed as instructed Secondary Dressing Woven Gauze Sponge, Non-Sterile 4x4 in Discharge Instruction: Apply over primary dressing as directed. Secured With Compression Wrap ThreePress (3 layer compression wrap) Discharge Instruction: Apply three layer compression as directed. Compression Stockings Add-Ons Wound #3 (Lower Leg) Wound Laterality: Right, Anterior Cleanser Soap and Water Discharge Instruction: May shower and wash wound with dial antibacterial soap and water prior to dressing change. Wound Cleanser Discharge Instruction: Cleanse the wound with wound cleanser prior to applying a clean dressing using gauze sponges, not tissue or cotton balls. Peri-Wound Care Sween Lotion (Moisturizing lotion) Discharge Instruction: Apply moisturizing lotion as directed Topical Primary Dressing KerraCel Ag Gelling Fiber Dressing, 2x2 in (silver alginate) Discharge Instruction: Apply silver alginate to wound bed as instructed Secondary Dressing Woven Gauze Sponge, Non-Sterile 4x4 in Discharge Instruction: Apply over primary dressing as directed. Secured With Compression Wrap ThreePress (3 layer compression wrap) Discharge Instruction: Apply three layer compression as directed. Compression Stockings Add-Ons Electronic Signature(s) Signed: 09/28/2021 9:43:14 AM By: Kalman Shan DO Signed: 09/28/2021 5:20:05 PM By: Deon Pilling RN, BSN Entered By: Kalman Shan on 09/28/2021 09:30:33 -------------------------------------------------------------------------------- Multi-Disciplinary Care Plan Details Patient Name: Date of Service: [name] [name] 09/28/2021 7:30 A M Medical Record Number: [id number] Patient Account Number: [id number] Date of Birth/Sex: Treating RN: 23-Dec-1960 (60 y.o. [name], Tammi Klippel Primary Care Jolonda Gomm: Fonnie Jarvis Other Clinician: Referring Twanna Resh: Treating Marai Teehan/Extender: Dorann Ou in Treatment: 0 Active Inactive Nutrition Nursing Diagnoses: Potential for alteratiion in Nutrition/Potential for imbalanced nutrition Goals: Patient/caregiver agrees to and verbalizes understanding of need to obtain nutritional consultation Date Initiated: 09/28/2021 Target Resolution Date: 10/15/2021 Goal Status: Active Interventions: Assess patient nutrition upon admission and as needed per policy Provide education on nutrition Treatment Activities: Patient referred to Primary Care Physician for further nutritional evaluation : 09/28/2021 Notes: Orientation to the Wound Care Program Nursing Diagnoses: Knowledge deficit related to the wound healing center program Goals: Patient/caregiver will verbalize understanding of the [place] Program Date Initiated: 09/28/2021 Target Resolution Date: 10/15/2021 Goal Status: Active Interventions: Provide education on orientation to the wound center Notes: Pain, Acute or Chronic Nursing Diagnoses: Pain, acute or chronic: actual or potential Potential alteration in comfort, pain Goals: Patient will verbalize adequate pain control and receive pain control interventions during procedures as needed Date Initiated: 09/28/2021 Target Resolution Date: 10/15/2021 Goal Status: Active Patient/caregiver will verbalize comfort level met Date Initiated:  09/28/2021 Target Resolution Date: 10/15/2021 Goal Status: Active Interventions: Encourage patient to take pain medications as prescribed Provide education on pain management Reposition patient for comfort Treatment Activities: Administer pain control measures as ordered : 09/28/2021 Notes: Wound/Skin Impairment Nursing Diagnoses: Knowledge deficit related to ulceration/compromised skin integrity Goals: Patient/caregiver will verbalize understanding of skin care regimen Date Initiated: 09/28/2021 Target Resolution Date: 10/15/2021 Goal Status: Active Interventions: Assess patient/caregiver ability to perform ulcer/skin care regimen upon admission and as needed Assess ulceration(s) every visit Provide education on smoking Provide education on ulcer and skin care Treatment Activities: Skin care regimen initiated : 09/28/2021  Smoking cessation education : 09/28/2021 Topical wound management initiated : 09/28/2021 Notes: Electronic Signature(s) Signed: 09/28/2021 5:20:05 PM By: Deon Pilling RN, BSN Entered By: Deon Pilling on 09/28/2021 08:24:00 -------------------------------------------------------------------------------- Pain Assessment Details Patient Name: Date of Service: [name] [name] 09/28/2021 7:30 A M Medical Record Number: [id number] Patient Account Number: [id number] Date of Birth/Sex: Treating RN: 11/19/1960 (60 y.o. [name] Primary Care Everett Ricciardelli: Fonnie Jarvis Other Clinician: Referring Melaine Mcphee: Treating Maureena Dabbs/Extender: Dorann Ou in Treatment: 0 Active Problems Location of Pain Severity and Description of Pain Patient Has Paino Yes Site Locations Pain Location: Generalized Pain, Pain in Ulcers Rate the pain. Current Pain Level: 10 Worst Pain Level: 10 Least Pain Level: 0 Tolerable Pain Level: 9 Pain Management and Medication Current Pain Management: Medication: No Cold Application: No Rest:  No Massage: No Activity: No T.E.N.S.: No Heat Application: No Leg drop or elevation: No Is the Current Pain Management Adequate: Adequate How does your wound impact your activities of daily livingo Sleep: No Bathing: No Appetite: No Relationship With Others: No Bladder Continence: No Emotions: No Bowel Continence: No Work: No Toileting: No Drive: No Dressing: No Hobbies: No Notes Chronic back pain affects both legs per patient. Electronic Signature(s) Signed: 09/28/2021 5:20:05 PM By: Deon Pilling RN, BSN Entered By: Deon Pilling on 09/28/2021 07:57:42 -------------------------------------------------------------------------------- Patient/Caregiver Education Details Patient Name: Date of Service: [name] [name] 11/22/2022andnbsp7:30 [place] Record Number: [id number] Patient Account Number: [id number] Date of Birth/Gender: Treating RN: [birth year] (61 y.o. [name] Primary Care Physician: Fonnie Jarvis Other Clinician: Referring Physician: Treating Physician/Extender: Dorann Ou in Treatment: 0 Education Assessment Education Provided To: Patient Education Topics Provided Venous: Handouts: Controlling Swelling with Compression Stockings , Controlling Swelling with Multilayered Compression Wraps, Managing Venous Disease and Related Ulcers Methods: Explain/Verbal, Printed Responses: Reinforcements needed [place]: o Handouts: Welcome T [facility] o Methods: Explain/Verbal, Printed Responses: Reinforcements needed Electronic Signature(s) Signed: 09/28/2021 5:20:05 PM By: Deon Pilling RN, BSN Entered By: Deon Pilling on 09/28/2021 08:24:26 -------------------------------------------------------------------------------- Wound Assessment Details Patient Name: Date of Service: [name] [name] 09/28/2021 7:30 A M Medical Record Number: [id number] Patient Account Number: [id number] Date  of Birth/Sex: Treating RN: [birth year] (60 y.o. [name] Primary Care Curtistine Pettitt: Fonnie Jarvis Other Clinician: Referring Deziree Mokry: Treating Maricia Scotti/Extender: Aloha Gell Weeks in Treatment: 0 Wound Status Wound Number: 1 Primary Venous Leg Ulcer Etiology: Wound Location: Left, Anterior Lower Leg Wound Open Wounding Event: Gradually Appeared Status: Date Acquired: 09/14/2021 Comorbid Chronic Obstructive Pulmonary Disease (COPD), Deep Vein Weeks Of Treatment: 0 History: Thrombosis, Hypertension, Peripheral Venous Disease, Neuropathy Clustered Wound: No Photos Wound Measurements Length: (cm) 3.4 Width: (cm) 2.4 Depth: (cm) 0.1 Area: (cm) 6.409 Volume: (cm) 0.641 % Reduction in Area: 0% % Reduction in Volume: 0% Epithelialization: Small (1-33%) Tunneling: No Undermining: No Wound Description Classification: Full Thickness Without Exposed Support Structures Wound Margin: Distinct, outline attached Exudate Amount: Medium Exudate Type: Serosanguineous Exudate Color: red, brown Foul Odor After Cleansing: No Slough/Fibrino Yes Wound Bed Granulation Amount: Large (67-100%) Exposed Structure Granulation Quality: Red, Pink Fascia Exposed: No Necrotic Amount: Small (1-33%) Fat Layer (Subcutaneous Tissue) Exposed: Yes Necrotic Quality: Adherent Slough Tendon Exposed: No Muscle Exposed: No Joint Exposed: No Bone Exposed: No Treatment Notes Wound #1 (Lower Leg) Wound Laterality: Left, Anterior Cleanser Soap and Water Discharge Instruction: May shower and wash wound with dial antibacterial soap and water prior to dressing change. Wound Cleanser  Discharge Instruction: Cleanse the wound with wound cleanser prior to applying a clean dressing using gauze sponges, not tissue or cotton balls. Peri-Wound Care Sween Lotion (Moisturizing lotion) Discharge Instruction: Apply moisturizing lotion as directed Topical Primary Dressing KerraCel Ag  Gelling Fiber Dressing, 2x2 in (silver alginate) Discharge Instruction: Apply silver alginate to wound bed as instructed Secondary Dressing Woven Gauze Sponge, Non-Sterile 4x4 in Discharge Instruction: Apply over primary dressing as directed. Secured With Compression Wrap ThreePress (3 layer compression wrap) Discharge Instruction: Apply three layer compression as directed. Compression Stockings Add-Ons Electronic Signature(s) Signed: 09/28/2021 5:20:05 PM By: Deon Pilling RN, BSN Entered By: Deon Pilling on 09/28/2021 08:17:59 -------------------------------------------------------------------------------- Wound Assessment Details Patient Name: Date of Service: [name] [name] 09/28/2021 7:30 A M Medical Record Number: [id number] Patient Account Number: [id number] Date of Birth/Sex: Treating RN: 1961-01-07 (60 y.o. [name] Primary Care Drury Ardizzone: Fonnie Jarvis Other Clinician: Referring Tauren Delbuono: Treating Quinlynn Cuthbert/Extender: Aloha Gell Weeks in Treatment: 0 Wound Status Wound Number: 2 Primary Venous Leg Ulcer Etiology: Wound Location: Left, Medial Lower Leg Wound Open Wounding Event: Gradually Appeared Status: Date Acquired: 09/14/2021 Comorbid Chronic Obstructive Pulmonary Disease (COPD), Deep Vein Weeks Of Treatment: 0 History: Thrombosis, Hypertension, Peripheral Venous Disease, Neuropathy Clustered Wound: No Photos Wound Measurements Length: (cm) 1.1 Width: (cm) 0.6 Depth: (cm) 0.1 Area: (cm) 0.518 Volume: (cm) 0.052 % Reduction in Area: 0% % Reduction in Volume: 0% Epithelialization: Small (1-33%) Tunneling: No Undermining: No Wound Description Classification: Full Thickness Without Exposed Support Structures Wound Margin: Distinct, outline attached Exudate Amount: Medium Exudate Type: Serosanguineous Exudate Color: red, brown Foul Odor After Cleansing: No Slough/Fibrino Yes Wound Bed Granulation Amount: Large  (67-100%) Exposed Structure Granulation Quality: Red, Pink Fascia Exposed: No Necrotic Amount: Small (1-33%) Fat Layer (Subcutaneous Tissue) Exposed: Yes Necrotic Quality: Adherent Slough Tendon Exposed: No Muscle Exposed: No Joint Exposed: No Bone Exposed: No Treatment Notes Wound #2 (Lower Leg) Wound Laterality: Left, Medial Cleanser Soap and Water Discharge Instruction: May shower and wash wound with dial antibacterial soap and water prior to dressing change. Wound Cleanser Discharge Instruction: Cleanse the wound with wound cleanser prior to applying a clean dressing using gauze sponges, not tissue or cotton balls. Peri-Wound Care Sween Lotion (Moisturizing lotion) Discharge Instruction: Apply moisturizing lotion as directed Topical Primary Dressing KerraCel Ag Gelling Fiber Dressing, 2x2 in (silver alginate) Discharge Instruction: Apply silver alginate to wound bed as instructed Secondary Dressing Woven Gauze Sponge, Non-Sterile 4x4 in Discharge Instruction: Apply over primary dressing as directed. Secured With Compression Wrap ThreePress (3 layer compression wrap) Discharge Instruction: Apply three layer compression as directed. Compression Stockings Add-Ons Electronic Signature(s) Signed: 09/28/2021 5:20:05 PM By: Deon Pilling RN, BSN Entered By: Deon Pilling on 09/28/2021 08:18:23 -------------------------------------------------------------------------------- Wound Assessment Details Patient Name: Date of Service: [name] [name] 09/28/2021 7:30 A M Medical Record Number: [id number] Patient Account Number: [id number] Date of Birth/Sex: Treating RN: 11-Dec-1960 (60 y.o. [name] Primary Care Shea Kapur: Fonnie Jarvis Other Clinician: Referring Zyhir Cappella: Treating Jayshon Dommer/Extender: Aloha Gell Weeks in Treatment: 0 Wound Status Wound Number: 3 Primary Venous Leg Ulcer Etiology: Wound Location: Right, Anterior Lower Leg Wound  Open Wounding Event: Gradually Appeared Status: Date Acquired: 09/14/2021 Comorbid Chronic Obstructive Pulmonary Disease (COPD), Deep Vein Weeks Of Treatment: 0 History: Thrombosis, Hypertension, Peripheral Venous Disease, Neuropathy Clustered Wound: No Photos Wound Measurements Length: (cm) 1.3 Width: (cm) 0.5 Depth: (cm) 0.1 Area: (cm) 0.511 Volume: (cm) 0.051 % Reduction in Area: 0% % Reduction in Volume: 0% Tunneling:  No Undermining: No Wound Description Classification: Full Thickness Without Exposed Support Structures Wound Margin: Distinct, outline attached Exudate Amount: Medium Exudate Type: Serosanguineous Exudate Color: red, brown Foul Odor After Cleansing: No Slough/Fibrino Yes Wound Bed Granulation Amount: Large (67-100%) Exposed Structure Granulation Quality: Red, Pink Fascia Exposed: No Necrotic Amount: Small (1-33%) Fat Layer (Subcutaneous Tissue) Exposed: Yes Necrotic Quality: Adherent Slough Tendon Exposed: No Muscle Exposed: No Joint Exposed: No Bone Exposed: No Treatment Notes Wound #3 (Lower Leg) Wound Laterality: Right, Anterior Cleanser Soap and Water Discharge Instruction: May shower and wash wound with dial antibacterial soap and water prior to dressing change. Wound Cleanser Discharge Instruction: Cleanse the wound with wound cleanser prior to applying a clean dressing using gauze sponges, not tissue or cotton balls. Peri-Wound Care Sween Lotion (Moisturizing lotion) Discharge Instruction: Apply moisturizing lotion as directed Topical Primary Dressing KerraCel Ag Gelling Fiber Dressing, 2x2 in (silver alginate) Discharge Instruction: Apply silver alginate to wound bed as instructed Secondary Dressing Woven Gauze Sponge, Non-Sterile 4x4 in Discharge Instruction: Apply over primary dressing as directed. Secured With Compression Wrap ThreePress (3 layer compression wrap) Discharge Instruction: Apply three layer compression as  directed. Compression Stockings Add-Ons Electronic Signature(s) Signed: 09/28/2021 5:20:05 PM By: Deon Pilling RN, BSN Entered By: Deon Pilling on 09/28/2021 08:18:45 -------------------------------------------------------------------------------- Vitals Details Patient Name: Date of Service: [name] [name] 09/28/2021 7:30 [place] Record Number: [id number] Patient Account Number: [id number] Date of Birth/Sex: Treating RN: Mar 20, 1961 (60 y.o. [name] Primary Care Sahiba Granholm: Fonnie Jarvis Other Clinician: Referring Una Yeomans: Treating Tully Mcinturff/Extender: Dorann Ou in Treatment: 0 Vital Signs Time Taken: 07:49 Temperature (F): 98.2 Height (in): 71 Pulse (bpm): 68 Source: Stated Respiratory Rate (breaths/min): 18 Weight (lbs): 216 Blood Pressure (mmHg): 179/83 Source: Stated Reference Range: 80 - 120 mg / dl Body Mass Index (BMI): 30.1 Electronic Signature(s) Signed: 09/28/2021 5:20:05 PM By: Deon Pilling RN, BSN Entered By: Deon Pilling on 09/28/2021 07:51:59

## 2021-09-28 NOTE — Progress Notes (Signed)
[name] [name], [name] [name] ([id number]) Visit Report for 09/28/2021 Chief Complaint Document Details Patient Name: Date of Service: [name] [name] 09/28/2021 7:30 A M Medical Record Number: [id number] Patient Account Number: [id number] Date of Birth/Sex: Treating RN: September 16, 1961 (60 y.o. [name] Primary Care Provider: Fonnie Jarvis Other Clinician: Referring Provider: Treating Provider/Extender: Dorann Ou in Treatment: 0 Information Obtained from: Patient Chief Complaint Bilateral lower extremity wounds Electronic Signature(s) Signed: 09/28/2021 9:43:14 AM By: Kalman Shan DO Entered By: Kalman Shan on 09/28/2021 09:31:24 -------------------------------------------------------------------------------- Debridement Details Patient Name: Date of Service: [name] [name] 09/28/2021 7:30 [place] Record Number: [id number] Patient Account Number: [id number] Date of Birth/Sex: Treating RN: 07-Jul-1961 (60 y.o. [name], [name] Primary Care Provider: Fonnie Jarvis Other Clinician: Referring Provider: Treating Provider/Extender: Dorann Ou in Treatment: 0 Debridement Performed for Assessment: Wound #1 Left,Anterior Lower Leg Performed By: Physician Kalman Shan, DO Debridement Type: Debridement Severity of Tissue Pre Debridement: Fat layer exposed Level of Consciousness (Pre-procedure): Awake and Alert Pre-procedure Verification/Time Out Yes - 08:25 Taken: Start Time: 08:26 Pain Control: Other : benzocaine 20% T Area Debrided (L x W): otal 3.4 (cm) x 2.4 (cm) = 8.16 (cm) Tissue and other material debrided: Viable, Non-Viable, Slough, Subcutaneous, Skin: Dermis , Skin: Epidermis, Fibrin/Exudate, Slough Level: Skin/Subcutaneous Tissue Debridement Description: Excisional Instrument: Curette Bleeding: Minimum Hemostasis Achieved: Pressure End Time: 08:31 Procedural Pain: 0 Post Procedural Pain: 0 Response to  Treatment: Procedure was tolerated well Level of Consciousness (Post- Awake and Alert procedure): Post Debridement Measurements of Total Wound Length: (cm) 3.4 Width: (cm) 2.4 Depth: (cm) 0.1 Volume: (cm) 0.641 Character of Wound/Ulcer Post Debridement: Improved Severity of Tissue Post Debridement: Fat layer exposed Post Procedure Diagnosis Same as Pre-procedure Electronic Signature(s) Signed: 09/28/2021 9:43:14 AM By: Kalman Shan DO Signed: 09/28/2021 5:20:05 PM By: Deon Pilling RN, BSN Entered By: Deon Pilling on 09/28/2021 08:31:59 -------------------------------------------------------------------------------- Debridement Details Patient Name: Date of Service: [name] [name] 09/28/2021 7:30 A M Medical Record Number: [id number] Patient Account Number: [id number] Date of Birth/Sex: Treating RN: 1961-09-08 (60 y.o. [name], [name] Primary Care Provider: Fonnie Jarvis Other Clinician: Referring Provider: Treating Provider/Extender: Dorann Ou in Treatment: 0 Debridement Performed for Assessment: Wound #3 Right,Anterior Lower Leg Performed By: Physician Kalman Shan, DO Debridement Type: Debridement Severity of Tissue Pre Debridement: Fat layer exposed Level of Consciousness (Pre-procedure): Awake and Alert Pre-procedure Verification/Time Out Yes - 08:25 Taken: Start Time: 08:26 Pain Control: Other : benzocaine 20% T Area Debrided (L x W): otal 1.3 (cm) x 0.5 (cm) = 0.65 (cm) Tissue and other material debrided: Viable, Non-Viable, Slough, Subcutaneous, Skin: Dermis , Skin: Epidermis, Fibrin/Exudate, Slough Level: Skin/Subcutaneous Tissue Debridement Description: Excisional Instrument: Curette Bleeding: Minimum Hemostasis Achieved: Pressure End Time: 08:31 Procedural Pain: 0 Post Procedural Pain: 0 Response to Treatment: Procedure was tolerated well Level of Consciousness (Post- Awake and Alert procedure): Post  Debridement Measurements of Total Wound Length: (cm) 1.3 Width: (cm) 0.5 Depth: (cm) 0.1 Volume: (cm) 0.051 Character of Wound/Ulcer Post Debridement: Improved Severity of Tissue Post Debridement: Fat layer exposed Post Procedure Diagnosis Same as Pre-procedure Electronic Signature(s) Signed: 09/28/2021 9:43:14 AM By: Kalman Shan DO Signed: 09/28/2021 5:20:05 PM By: Deon Pilling RN, BSN Entered By: Deon Pilling on 09/28/2021 08:32:24 -------------------------------------------------------------------------------- HPI Details Patient Name: Date of Service: [name] [name] 09/28/2021 7:30 A M Medical Record Number: [id number] Patient Account Number: [id number] Date of Birth/Sex: Treating RN: May 30, 1961 (60 y.o. 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 He states the wounds have been present for the past 6 months and wax and wane in healing. He has been seen by vein and vascular for this issue. He has been treated with Unna boots with benefit. He states he started compression stockings when there was 1 wound remaining and now has developed multiple wounds. He currently takes 20 mg of Lasix and has been doing so for years. He states he has been on several rounds of antibiotics for this issue. He last took Bactrim and completed this a couple days ago. He currently denies signs of infection. Electronic Signature(s) Signed: 09/28/2021 9:43:14 AM By: Kalman Shan DO Entered By: Kalman Shan on 09/28/2021  09:34:51 -------------------------------------------------------------------------------- Physical Exam Details Patient Name: Date of Service: [name] [name] 09/28/2021 7:30 A M Medical Record Number: [id number] Patient Account Number: [id number] Date of Birth/Sex: Treating RN: 12-17-1960 (60 y.o. [name] Primary Care Provider: Fonnie Jarvis Other Clinician: Referring Provider: Treating Provider/Extender: Dorann Ou in Treatment: 0 Constitutional respirations regular, non-labored and within target range for patient.. Cardiovascular 2+ dorsalis pedis/posterior tibialis pulses. Psychiatric pleasant and cooperative. Notes Bilateral lower extremity wounds with nonviable tissue Throughout. 2+ pitting edema to the knees. Venous stasis dermatitis bilaterally. No obvious signs of infection. Electronic Signature(s) Signed: 09/28/2021 9:43:14 AM By: Kalman Shan DO Entered By: Kalman Shan on 09/28/2021 09:35:42 -------------------------------------------------------------------------------- Physician Orders Details Patient Name: Date of Service: [name] [name] 09/28/2021 7:30 A M Medical Record Number: [id number] Patient Account Number: [id number] Date of Birth/Sex: Treating RN: Jun 25, 1961 (60 y.o. [name] Primary Care Provider: Fonnie Jarvis Other Clinician: Referring Provider: Treating Provider/Extender: Dorann Ou in Treatment: 0 Verbal / Phone Orders: No Diagnosis Coding Follow-up Appointments ppointment in 1 week. - Dr. Heber [place] Return A Bathing/ Shower/ Hygiene May shower with protection but do not get wound dressing(s) wet. Edema Control - Lymphedema / SCD / Other Elevate legs to the level of the heart or above for 30 minutes daily and/or when sitting, a frequency of: - 3-4 times a [place] throughout the [place]. Avoid standing for long periods of time. Exercise regularly Additional Orders /  Instructions Stop/Decrease Smoking Wound Treatment Wound #1 - Lower Leg Wound Laterality: Left, Anterior Cleanser: Soap and Water 1 x Per Week Discharge Instructions: May shower and wash wound with dial antibacterial soap and water prior to dressing change. Cleanser: Wound Cleanser 1 x Per Week Discharge Instructions: Cleanse the wound with wound cleanser prior to applying a clean dressing using gauze sponges, not tissue or cotton balls. Peri-Wound Care: Sween Lotion (Moisturizing lotion) 1 x Per Week Discharge Instructions: Apply moisturizing lotion as directed Prim Dressing: KerraCel Ag Gelling Fiber Dressing, 2x2 in (silver alginate) 1 x Per Week ary Discharge Instructions: Apply silver alginate to wound bed as instructed Secondary Dressing: Woven Gauze Sponge, Non-Sterile 4x4 in 1 x Per Week Discharge Instructions: Apply over primary dressing as directed. Compression Wrap: ThreePress (3 layer compression wrap) 1 x Per Week Discharge Instructions: Apply three layer compression as directed. Wound #2 - Lower Leg Wound Laterality: Left, Medial Cleanser: Soap and Water 1 x Per Week Discharge Instructions:  May shower and wash wound with dial antibacterial soap and water prior to dressing change. Cleanser: Wound Cleanser 1 x Per Week Discharge Instructions: Cleanse the wound with wound cleanser prior to applying a clean dressing using gauze sponges, not tissue or cotton balls. Peri-Wound Care: Sween Lotion (Moisturizing lotion) 1 x Per Week Discharge Instructions: Apply moisturizing lotion as directed Prim Dressing: KerraCel Ag Gelling Fiber Dressing, 2x2 in (silver alginate) 1 x Per Week ary Discharge Instructions: Apply silver alginate to wound bed as instructed Secondary Dressing: Woven Gauze Sponge, Non-Sterile 4x4 in 1 x Per Week Discharge Instructions: Apply over primary dressing as directed. Compression Wrap: ThreePress (3 layer compression wrap) 1 x Per Week Discharge Instructions:  Apply three layer compression as directed. Wound #3 - Lower Leg Wound Laterality: Right, Anterior Cleanser: Soap and Water 1 x Per Week Discharge Instructions: May shower and wash wound with dial antibacterial soap and water prior to dressing change. Cleanser: Wound Cleanser 1 x Per Week Discharge Instructions: Cleanse the wound with wound cleanser prior to applying a clean dressing using gauze sponges, not tissue or cotton balls. Peri-Wound Care: Sween Lotion (Moisturizing lotion) 1 x Per Week Discharge Instructions: Apply moisturizing lotion as directed Prim Dressing: KerraCel Ag Gelling Fiber Dressing, 2x2 in (silver alginate) 1 x Per Week ary Discharge Instructions: Apply silver alginate to wound bed as instructed Secondary Dressing: Woven Gauze Sponge, Non-Sterile 4x4 in 1 x Per Week Discharge Instructions: Apply over primary dressing as directed. Compression Wrap: ThreePress (3 layer compression wrap) 1 x Per Week Discharge Instructions: Apply three layer compression as directed. Patient Medications llergies: amlodipine, fentanyl, methadone A Notifications Medication Indication Start End benzocaine DOSE topical 20 % aerosol - aerosol topical applied only in clinic for debridements. Electronic Signature(s) Signed: 09/28/2021 9:43:14 AM By: Kalman Shan DO Entered By: Kalman Shan on 09/28/2021 09:35:59 Prescription 09/28/2021 -------------------------------------------------------------------------------- Irish Elders DO Patient Name: Provider: 05/17/1961 [id number] Date of Birth: NPI#: Jerilynn Mages [id number] Sex: DEA #: [phone] Phone #: License #: [place] Patient Address: [name] [address] [place], [place] [address] [place], [place] [address] [phone] Allergies amlodipine; fentanyl; methadone Medication Medication: Route: Strength: Form: benzocaine 20 % topical  aerosol topical 20 % aerosol Class: TOPICAL LOCAL ANESTHETICS Dose: Frequency / Time: Indication: aerosol topical applied only in clinic for debridements. Number of Refills: Number of Units: 0 Generic Substitution: Start Date: End Date: One Time Use: Substitution Permitted No Note to Pharmacy: Hand Signature: Date(s): Electronic Signature(s) Signed: 09/28/2021 9:43:14 AM By: Kalman Shan DO Entered By: Kalman Shan on 09/28/2021 09:35:59 -------------------------------------------------------------------------------- Problem List Details Patient Name: Date of Service: [name] [name] 09/28/2021 7:30 A M Medical Record Number: [id number] Patient Account Number: [id number] Date of Birth/Sex: Treating RN: Dec 18, 1960 (60 y.o. [name], [name] Primary Care Provider: Fonnie Jarvis Other Clinician: Referring Provider: Treating Provider/Extender: Dorann Ou in Treatment: 0 Active Problems ICD-10 Encounter Code Description Active Date MDM Diagnosis I87.313 Chronic venous hypertension (idiopathic) with ulcer of bilateral lower extremity 09/28/2021 No Yes L97.812 Non-pressure chronic ulcer of other part of right lower leg with fat layer 09/28/2021 No Yes exposed L97.822 Non-pressure chronic ulcer of other part of left lower leg with fat layer exposed11/22/2022 No Yes F17.210 Nicotine dependence, cigarettes, uncomplicated 63/14/9702 No Yes I10 Essential (primary) hypertension 09/28/2021 No Yes J44.9 Chronic obstructive pulmonary disease, unspecified 09/28/2021 No Yes Inactive Problems Resolved Problems Electronic Signature(s) Signed: 09/28/2021 9:43:14 AM By: Kalman Shan  DO Entered By: Kalman Shan on 09/28/2021 09:30:25 -------------------------------------------------------------------------------- Progress Note Details Patient Name: Date of Service: [name] [name] 09/28/2021 7:30 A M Medical Record Number: [id number] Patient  Account Number: [id number] Date of Birth/Sex: Treating RN: 02-27-1961 (60 y.o. 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 He states the wounds have been present for the past 6 months and wax and wane in healing. He has been seen by vein and vascular for this issue. He has been treated with Unna boots with benefit. He states he started compression stockings when there was 1 wound remaining and now has developed multiple wounds. He currently takes 20 mg of Lasix and has been doing so for years. He states he has been on several rounds of antibiotics for this issue. He last took Bactrim and completed this a couple days ago. He currently denies signs of infection. Patient History Information obtained from Patient, Chart. Allergies amlodipine (Reaction: swelling), fentanyl (Reaction: near overdose r/t regulating body temperature), methadone (Reaction: ithcing) Family History Cancer - Mother, Diabetes - Father,Siblings, Heart Disease - Mother, Hypertension - Mother, No family history of Kidney Disease, Lung Disease, Seizures, Stroke, Thyroid Problems, Tuberculosis. Social History Current every [place] smoker - 1 1/2 ppd, Marital Status - Divorced, Alcohol Use - Never, Drug Use - No History, Caffeine Use - Daily - coffee. Medical History Eyes Denies history of Cataracts, Glaucoma, Optic Neuritis Ear/Nose/Mouth/Throat Denies history of Chronic sinus problems/congestion, Middle ear  problems Hematologic/Lymphatic Denies history of Anemia, Hemophilia, Human Immunodeficiency Virus, Lymphedema, Sickle Cell Disease Respiratory Patient has history of Chronic Obstructive Pulmonary Disease (COPD) Denies history of Aspiration, Asthma, Pneumothorax, Sleep Apnea, Tuberculosis Cardiovascular Patient has history of Deep Vein Thrombosis, Hypertension, Peripheral Venous Disease Denies history of Angina, Arrhythmia, Congestive Heart Failure, Coronary Artery Disease, Hypotension, Myocardial Infarction, Peripheral Arterial Disease, Phlebitis, Vasculitis Gastrointestinal Denies history of Cirrhosis , Colitis, Crohnoos, Hepatitis A, Hepatitis B, Hepatitis C Endocrine Denies history of Type I Diabetes, Type II Diabetes Genitourinary Denies history of End Stage Renal Disease Immunological Denies history of Lupus Erythematosus, Raynaudoos, Scleroderma Integumentary (Skin) Denies history of History of Burn Musculoskeletal Denies history of Gout, Rheumatoid Arthritis, Osteoarthritis, Osteomyelitis Neurologic Patient has history of Neuropathy - left sciatic nerve pain Denies history of Dementia, Quadriplegia, Paraplegia, Seizure Disorder Oncologic Denies history of Received Chemotherapy, Received Radiation Psychiatric Denies history of Anorexia/bulimia, Confinement Anxiety Hospitalization/Surgery History - Spinal Surgery over 20 years ago. Medical A Surgical History Notes nd Constitutional Symptoms (General Health) Stroke 2016 Migraine Cardiovascular PE Psychiatric Anxiety Review of Systems (ROS) Constitutional Symptoms (General Health) Denies complaints or symptoms of Fatigue, Fever, Chills, Marked Weight Change. Eyes Complains or has symptoms of Glasses / Contacts - glasses. Denies complaints or symptoms of Dry Eyes, Vision Changes. Ear/Nose/Mouth/Throat Denies complaints or symptoms of Chronic sinus problems or rhinitis. Respiratory Denies complaints or symptoms of  Chronic or frequent coughs, Shortness of Breath. Cardiovascular Denies complaints or symptoms of Chest pain. Gastrointestinal Denies complaints or symptoms of Frequent diarrhea, Nausea, Vomiting. Endocrine Denies complaints or symptoms of Heat/cold intolerance. Genitourinary Denies complaints or symptoms of Frequent urination. Integumentary (Skin) Complains or  has symptoms of Wounds - BLE. Musculoskeletal Denies complaints or symptoms of Muscle Pain, Muscle Weakness. Neurologic Denies complaints or symptoms of Numbness/parasthesias. Psychiatric Denies complaints or symptoms of Claustrophobia, Suicidal. Objective Constitutional respirations regular, non-labored and within target range for patient.. Vitals Time Taken: 7:49 AM, Height: 71 in, Source: Stated, Weight: 216 lbs, Source: Stated, BMI: 30.1, Temperature: 98.2 F, Pulse: 68 bpm, Respiratory Rate: 18 breaths/min, Blood Pressure: 179/83 mmHg. Cardiovascular 2+ dorsalis pedis/posterior tibialis pulses. Psychiatric pleasant and cooperative. General Notes: Bilateral lower extremity wounds with nonviable tissue Throughout. 2+ pitting edema to the knees. Venous stasis dermatitis bilaterally. No obvious signs of infection. Integumentary (Hair, Skin) Wound #1 status is Open. Original cause of wound was Gradually Appeared. The date acquired was: 09/14/2021. The wound is located on the Left,Anterior Lower Leg. The wound measures 3.4cm length x 2.4cm width x 0.1cm depth; 6.409cm^2 area and 0.641cm^3 volume. There is Fat Layer (Subcutaneous Tissue) exposed. There is no tunneling or undermining noted. There is a medium amount of serosanguineous drainage noted. The wound margin is distinct with the outline attached to the wound base. There is large (67-100%) red, pink granulation within the wound bed. There is a small (1-33%) amount of necrotic tissue within the wound bed including Adherent Slough. Wound #2 status is Open. Original cause of  wound was Gradually Appeared. The date acquired was: 09/14/2021. The wound is located on the Left,Medial Lower Leg. The wound measures 1.1cm length x 0.6cm width x 0.1cm depth; 0.518cm^2 area and 0.052cm^3 volume. There is Fat Layer (Subcutaneous Tissue) exposed. There is no tunneling or undermining noted. There is a medium amount of serosanguineous drainage noted. The wound margin is distinct with the outline attached to the wound base. There is large (67-100%) red, pink granulation within the wound bed. There is a small (1-33%) amount of necrotic tissue within the wound bed including Adherent Slough. Wound #3 status is Open. Original cause of wound was Gradually Appeared. The date acquired was: 09/14/2021. The wound is located on the Right,Anterior Lower Leg. The wound measures 1.3cm length x 0.5cm width x 0.1cm depth; 0.511cm^2 area and 0.051cm^3 volume. There is Fat Layer (Subcutaneous Tissue) exposed. There is no tunneling or undermining noted. There is a medium amount of serosanguineous drainage noted. The wound margin is distinct with the outline attached to the wound base. There is large (67-100%) red, pink granulation within the wound bed. There is a small (1-33%) amount of necrotic tissue within the wound bed including Adherent Slough. Assessment Active Problems ICD-10 Chronic venous hypertension (idiopathic) with ulcer of bilateral lower extremity Non-pressure chronic ulcer of other part of right lower leg with fat layer exposed Non-pressure chronic ulcer of other part of left lower leg with fat layer exposed Nicotine dependence, cigarettes, uncomplicated Essential (primary) hypertension Chronic obstructive pulmonary disease, unspecified Patient presents with a 56-month history of nonhealing wounds to his lower extremities bilaterally. Etiology is from venous insufficiency. There were no obvious signs of infection on exam. I debrided nonviable tissue. ABIs on the left were 1.01 and on the  right 1.0. I recommended silver alginate under 3 layer compression. He knows to not get the compression wrap wet and to take them off if there are any issues. He also knows that he cannot have these on for more than a week. We also discussed the importance of smoking cessation in wound healing. Patient was evaluated [facility], Dr. Trula Slade and compression and elevation was recommended as the primary treatment modality. 46 minutes was spent on encounter  including face-to-face, EMR review and coordination of care. Procedures Wound #1 Pre-procedure diagnosis of Wound #1 is a Venous Leg Ulcer located on the Left,Anterior Lower Leg .Severity of Tissue Pre Debridement is: Fat layer exposed. There was a Excisional Skin/Subcutaneous Tissue Debridement with a total area of 8.16 sq cm performed by Kalman Shan, DO. With the following instrument(s): Curette to remove Viable and Non-Viable tissue/material. Material removed includes Subcutaneous Tissue, Slough, Skin: Dermis, Skin: Epidermis, and Fibrin/Exudate after achieving pain control using Other (benzocaine 20%). A time out was conducted at 08:25, prior to the start of the procedure. A Minimum amount of bleeding was controlled with Pressure. The procedure was tolerated well with a pain level of 0 throughout and a pain level of 0 following the procedure. Post Debridement Measurements: 3.4cm length x 2.4cm width x 0.1cm depth; 0.641cm^3 volume. Character of Wound/Ulcer Post Debridement is improved. Severity of Tissue Post Debridement is: Fat layer exposed. Post procedure Diagnosis Wound #1: Same as Pre-Procedure Pre-procedure diagnosis of Wound #1 is a Venous Leg Ulcer located on the Left,Anterior Lower Leg . There was a Three Layer Compression Therapy Procedure with a pre-treatment ABI of 1 by Deon Pilling, RN. Post procedure Diagnosis Wound #1: Same as Pre-Procedure Wound #3 Pre-procedure diagnosis of Wound #3 is a Venous Leg Ulcer located on the  Right,Anterior Lower Leg .Severity of Tissue Pre Debridement is: Fat layer exposed. There was a Excisional Skin/Subcutaneous Tissue Debridement with a total area of 0.65 sq cm performed by Kalman Shan, DO. With the following instrument(s): Curette to remove Viable and Non-Viable tissue/material. Material removed includes Subcutaneous Tissue, Slough, Skin: Dermis, Skin: Epidermis, and Fibrin/Exudate after achieving pain control using Other (benzocaine 20%). A time out was conducted at 08:25, prior to the start of the procedure. A Minimum amount of bleeding was controlled with Pressure. The procedure was tolerated well with a pain level of 0 throughout and a pain level of 0 following the procedure. Post Debridement Measurements: 1.3cm length x 0.5cm width x 0.1cm depth; 0.051cm^3 volume. Character of Wound/Ulcer Post Debridement is improved. Severity of Tissue Post Debridement is: Fat layer exposed. Post procedure Diagnosis Wound #3: Same as Pre-Procedure Pre-procedure diagnosis of Wound #3 is a Venous Leg Ulcer located on the Right,Anterior Lower Leg . There was a Three Layer Compression Therapy Procedure with a pre-treatment ABI of 1 by Deon Pilling, RN. Post procedure Diagnosis Wound #3: Same as Pre-Procedure Wound #2 Pre-procedure diagnosis of Wound #2 is a Venous Leg Ulcer located on the Left,Medial Lower Leg . There was a Three Layer Compression Therapy Procedure with a pre-treatment ABI of 1 by Deon Pilling, RN. Post procedure Diagnosis Wound #2: Same as Pre-Procedure Plan Follow-up Appointments: Return Appointment in 1 week. - Dr. Heber Fielding Bathing/ Shower/ Hygiene: May shower with protection but do not get wound dressing(s) wet. Edema Control - Lymphedema / SCD / Other: Elevate legs to the level of the heart or above for 30 minutes daily and/or when sitting, a frequency of: - 3-4 times a [place] throughout the [place]. Avoid standing for long periods of time. Exercise regularly Additional  Orders / Instructions: Stop/Decrease Smoking The following medication(s) was prescribed: benzocaine topical 20 % aerosol aerosol topical applied only in clinic for debridements. was prescribed at facility WOUND #1: - Lower Leg Wound Laterality: Left, Anterior Cleanser: Soap and Water 1 x Per Week/ Discharge Instructions: May shower and wash wound with dial antibacterial soap and water prior to dressing change. Cleanser: Wound Cleanser 1 x Per Week/ Discharge  Instructions: Cleanse the wound with wound cleanser prior to applying a clean dressing using gauze sponges, not tissue or cotton balls. Peri-Wound Care: Sween Lotion (Moisturizing lotion) 1 x Per Week/ Discharge Instructions: Apply moisturizing lotion as directed Prim Dressing: KerraCel Ag Gelling Fiber Dressing, 2x2 in (silver alginate) 1 x Per Week/ ary Discharge Instructions: Apply silver alginate to wound bed as instructed Secondary Dressing: Woven Gauze Sponge, Non-Sterile 4x4 in 1 x Per Week/ Discharge Instructions: Apply over primary dressing as directed. Com pression Wrap: ThreePress (3 layer compression wrap) 1 x Per Week/ Discharge Instructions: Apply three layer compression as directed. WOUND #2: - Lower Leg Wound Laterality: Left, Medial Cleanser: Soap and Water 1 x Per Week/ Discharge Instructions: May shower and wash wound with dial antibacterial soap and water prior to dressing change. Cleanser: Wound Cleanser 1 x Per Week/ Discharge Instructions: Cleanse the wound with wound cleanser prior to applying a clean dressing using gauze sponges, not tissue or cotton balls. Peri-Wound Care: Sween Lotion (Moisturizing lotion) 1 x Per Week/ Discharge Instructions: Apply moisturizing lotion as directed Prim Dressing: KerraCel Ag Gelling Fiber Dressing, 2x2 in (silver alginate) 1 x Per Week/ ary Discharge Instructions: Apply silver alginate to wound bed as instructed Secondary Dressing: Woven Gauze Sponge, Non-Sterile 4x4 in 1 x  Per Week/ Discharge Instructions: Apply over primary dressing as directed. Com pression Wrap: ThreePress (3 layer compression wrap) 1 x Per Week/ Discharge Instructions: Apply three layer compression as directed. WOUND #3: - Lower Leg Wound Laterality: Right, Anterior Cleanser: Soap and Water 1 x Per Week/ Discharge Instructions: May shower and wash wound with dial antibacterial soap and water prior to dressing change. Cleanser: Wound Cleanser 1 x Per Week/ Discharge Instructions: Cleanse the wound with wound cleanser prior to applying a clean dressing using gauze sponges, not tissue or cotton balls. Peri-Wound Care: Sween Lotion (Moisturizing lotion) 1 x Per Week/ Discharge Instructions: Apply moisturizing lotion as directed Prim Dressing: KerraCel Ag Gelling Fiber Dressing, 2x2 in (silver alginate) 1 x Per Week/ ary Discharge Instructions: Apply silver alginate to wound bed as instructed Secondary Dressing: Woven Gauze Sponge, Non-Sterile 4x4 in 1 x Per Week/ Discharge Instructions: Apply over primary dressing as directed. Com pression Wrap: ThreePress (3 layer compression wrap) 1 x Per Week/ Discharge Instructions: Apply three layer compression as directed. 1. Silver alginate under 3 layer compression 2. In office sharp debridement 3. Follow-up in 1 week Electronic Signature(s) Signed: 09/28/2021 9:43:14 AM By: Kalman Shan DO Entered By: Kalman Shan on 09/28/2021 09:42:49 -------------------------------------------------------------------------------- HxROS Details Patient Name: Date of Service: [name] [name] 09/28/2021 7:30 A M Medical Record Number: [id number] Patient Account Number: [id number] Date of Birth/Sex: Treating RN: 1961-10-05 (60 y.o. [name] Primary Care Provider: Fonnie Jarvis Other Clinician: Referring Provider: Treating Provider/Extender: Dorann Ou in Treatment: 0 Information Obtained From Patient  Chart Constitutional Symptoms (General Health) Complaints and Symptoms: Negative for: Fatigue; Fever; Chills; Marked Weight Change Medical History: Past Medical History Notes: Stroke 2016 Migraine Eyes Complaints and Symptoms: Positive for: Glasses / Contacts - glasses Negative for: Dry Eyes; Vision Changes Medical History: Negative for: Cataracts; Glaucoma; Optic Neuritis Ear/Nose/Mouth/Throat Complaints and Symptoms: Negative for: Chronic sinus problems or rhinitis Medical History: Negative for: Chronic sinus problems/congestion; Middle ear problems Respiratory Complaints and Symptoms: Negative for: Chronic or frequent coughs; Shortness of Breath Medical History: Positive for: Chronic Obstructive Pulmonary Disease (COPD) Negative for: Aspiration; Asthma; Pneumothorax; Sleep Apnea; Tuberculosis Cardiovascular Complaints and Symptoms: Negative for: Chest pain Medical  History: Positive for: Deep Vein Thrombosis; Hypertension; Peripheral Venous Disease Negative for: Angina; Arrhythmia; Congestive Heart Failure; Coronary Artery Disease; Hypotension; Myocardial Infarction; Peripheral Arterial Disease; Phlebitis; Vasculitis Past Medical History Notes: PE Gastrointestinal Complaints and Symptoms: Negative for: Frequent diarrhea; Nausea; Vomiting Medical History: Negative for: Cirrhosis ; Colitis; Crohns; Hepatitis A; Hepatitis B; Hepatitis C Endocrine Complaints and Symptoms: Negative for: Heat/cold intolerance Medical History: Negative for: Type I Diabetes; Type II Diabetes Genitourinary Complaints and Symptoms: Negative for: Frequent urination Medical History: Negative for: End Stage Renal Disease Integumentary (Skin) Complaints and Symptoms: Positive for: Wounds - BLE Medical History: Negative for: History of Burn Musculoskeletal Complaints and Symptoms: Negative for: Muscle Pain; Muscle Weakness Medical History: Negative for: Gout; Rheumatoid Arthritis;  Osteoarthritis; Osteomyelitis Neurologic Complaints and Symptoms: Negative for: Numbness/parasthesias Medical History: Positive for: Neuropathy - left sciatic nerve pain Negative for: Dementia; Quadriplegia; Paraplegia; Seizure Disorder Psychiatric Complaints and Symptoms: Negative for: Claustrophobia; Suicidal Medical History: Negative for: Anorexia/bulimia; Confinement Anxiety Past Medical History Notes: Anxiety Hematologic/Lymphatic Medical History: Negative for: Anemia; Hemophilia; Human Immunodeficiency Virus; Lymphedema; Sickle Cell Disease Immunological Medical History: Negative for: Lupus Erythematosus; Raynauds; Scleroderma Oncologic Medical History: Negative for: Received Chemotherapy; Received Radiation Immunizations Pneumococcal Vaccine: Received Pneumococcal Vaccination: No Implantable Devices No devices added Hospitalization / Surgery History Type of Hospitalization/Surgery Spinal Surgery over 20 years ago Family and Social History Cancer: Yes - Mother; Diabetes: Yes - Father,Siblings; Heart Disease: Yes - Mother; Hypertension: Yes - Mother; Kidney Disease: No; Lung Disease: No; Seizures: No; Stroke: No; Thyroid Problems: No; Tuberculosis: No; Current every Allm[employer]er smoker - 1 1/2 ppd; Marital Status - Divorced; Alcohol Use: Never; Drug Use: No History; Caffeine Use: Daily - coffee; Financial Concerns: No; Food, Clothing or Shelter Needs: No; Support System Lacking: No; Transportation Concerns: No Electronic Signature(s) Signed: 09/28/2021 9:43:14 AM By: Kalman Shan DO Signed: 09/28/2021 5:20:05 PM By: Deon Pilling RN, BSN Entered By: Deon Pilling on 09/28/2021 08:01:45 -------------------------------------------------------------------------------- SuperBill Details Patient Name: Date of Service: [name] [name] 09/28/2021 Medical Record Number: [id number] Patient Account Number: [id number] Date of Birth/Sex: Treating RN: 08-Oct-1961 (60 y.o. [name],  [name] Primary Care Provider: Fonnie Jarvis Other Clinician: Referring Provider: Treating Provider/Extender: Aloha Gell Weeks in Treatment: 0 Diagnosis Coding ICD-10 Codes Code Description [phone] Chronic venous hypertension (idiopathic) with ulcer of bilateral lower extremity L97.812 Non-pressure chronic ulcer of other part of right lower leg with fat layer exposed L97.822 Non-pressure chronic ulcer of other part of left lower leg with fat layer exposed F17.210 Nicotine dependence, cigarettes, uncomplicated E17 Essential (primary) hypertension J44.9 Chronic obstructive pulmonary disease, unspecified Facility Procedures CPT4 Code: [id number] Description: 99213 - WOUND CARE VISIT-LEV 3 EST PT Modifier: Quantity: 1 CPT4 Code: [id number] Description: 11042 - DEB SUBQ TISSUE 20 SQ CM/< ICD-10 Diagnosis Description I87.313 Chronic venous hypertension (idiopathic) with ulcer of bilateral lower extremity L97.812 Non-pressure chronic ulcer of other part of right lower leg with fat layer expos  L97.822 Non-pressure chronic ulcer of other part of left lower leg with fat layer expose Modifier: ed d Quantity: 1 Physician Procedures : CPT4 Code Description Modifier [id number] - WC PHYS LEVEL 4 - NEW PT ICD-10 Diagnosis Description I87.313 Chronic venous hypertension (idiopathic) with ulcer of bilateral lower extremity L97.812 Non-pressure chronic ulcer of other part of right  lower leg with fat layer exposed L97.822 Non-pressure chronic ulcer of other part of left lower leg with fat layer exposed F17.210 Nicotine dependence, cigarettes, uncomplicated Quantity: 1 : [id number] 11042 - WC PHYS SUBQ  TISS 20 SQ CM ICD-10 Diagnosis Description I87.313 Chronic venous hypertension (idiopathic) with ulcer of bilateral lower extremity L97.812 Non-pressure chronic ulcer of other part of right lower leg with fat layer exposed  L97.822 Non-pressure chronic ulcer of other part of left  lower leg with fat layer exposed Quantity: 1 Electronic Signature(s) Signed: 09/28/2021 10:55:51 AM By: Deon Pilling RN, BSN Signed: 09/28/2021 12:38:15 PM By: Kalman Shan DO Previous Signature: 09/28/2021 9:43:14 AM Version By: Kalman Shan DO Entered By: Deon Pilling on 09/28/2021 10:55:51

## 2021-10-04 ENCOUNTER — Ambulatory Visit (INDEPENDENT_AMBULATORY_CARE_PROVIDER_SITE_OTHER): Payer: Medicaid Other

## 2021-10-04 ENCOUNTER — Other Ambulatory Visit: Payer: Self-pay

## 2021-10-04 DIAGNOSIS — L02416 Cutaneous abscess of left lower limb: Secondary | ICD-10-CM

## 2021-10-04 DIAGNOSIS — T148XXA Other injury of unspecified body region, initial encounter: Secondary | ICD-10-CM

## 2021-10-04 NOTE — Progress Notes (Signed)
Patient present in office today for Unilateral Unna boot change LLE. He reports 10/10 pain levels, redness and clear drainage. Today the unna boot was replaced. Patient tolerated Unna boot wrapping well & advised He will need to return to our office for the next week for Unna boot changes. Patient refused to schedule an appointment because he was frustrated and stated he was tired of being in pain due to his leg. Patient was previously seen by Wound care but felt they caused his wounds and legs to get worst. Laurence Slate, PA-C discussed in great details the importance of walking and staying active, proper elevation and a few treatment options. Patient stated he wanted a vein procedure and did not understand why he does not meet the requirements to qualify for the procedure. Terrence Dupont reiterated the reasoning behind not being able to do this procedure, She discussed his previous Venous Duplex and even attempted to get him rescheduled with Wound care. Patient refused and left after Unna boot wrap was completed. Our office will reach out to him next week to attempt scheduling future visits with our office.   (RLE)   (LLE Lateral)   (LLE  Anterior Vernard Gambles Wound)   Supervising MD: Harold Barban, MD/ Jasper Loser Riccardo Dubin., CMA

## 2021-10-05 ENCOUNTER — Encounter (HOSPITAL_BASED_OUTPATIENT_CLINIC_OR_DEPARTMENT_OTHER): Payer: Medicaid Other | Admitting: Internal Medicine

## 2021-10-05 ENCOUNTER — Telehealth: Payer: Self-pay | Admitting: Physician Assistant

## 2021-10-05 DIAGNOSIS — I10 Essential (primary) hypertension: Secondary | ICD-10-CM | POA: Diagnosis not present

## 2021-10-05 DIAGNOSIS — I87313 Chronic venous hypertension (idiopathic) with ulcer of bilateral lower extremity: Secondary | ICD-10-CM | POA: Diagnosis not present

## 2021-10-05 DIAGNOSIS — L97822 Non-pressure chronic ulcer of other part of left lower leg with fat layer exposed: Secondary | ICD-10-CM | POA: Diagnosis not present

## 2021-10-05 DIAGNOSIS — Z86718 Personal history of other venous thrombosis and embolism: Secondary | ICD-10-CM | POA: Diagnosis not present

## 2021-10-05 DIAGNOSIS — L03116 Cellulitis of left lower limb: Secondary | ICD-10-CM

## 2021-10-05 DIAGNOSIS — F17210 Nicotine dependence, cigarettes, uncomplicated: Secondary | ICD-10-CM | POA: Diagnosis not present

## 2021-10-05 DIAGNOSIS — L97812 Non-pressure chronic ulcer of other part of right lower leg with fat layer exposed: Secondary | ICD-10-CM

## 2021-10-05 DIAGNOSIS — J449 Chronic obstructive pulmonary disease, unspecified: Secondary | ICD-10-CM | POA: Diagnosis not present

## 2021-10-05 NOTE — Progress Notes (Signed)
[name] [name], [name] [name] ([id number]) Visit Report for 10/05/2021 Arrival Information Details Patient Name: Date of Service: [name] [name] 10/05/2021 8:30 A M Medical Record Number: [id number] Patient Account Number: [id number] Date of Birth/Sex: Treating RN: May 28, 1961 (60 y.o. [name], [name] Primary Care Fate Caster: Fonnie Jarvis Other Clinician: Referring Benz Vandenberghe: Treating Fantasy Donald/Extender: Dorann Ou in Treatment: 1 Visit Information History Since Last Visit Added or deleted any medications: No Patient Arrived: Ambulatory Any new allergies or adverse reactions: No Arrival Time: 08:29 Had a fall or experienced change in No Accompanied By: self activities of daily living that may affect Transfer Assistance: None risk of falls: Patient Identification Verified: Yes Signs or symptoms of abuse/neglect since last visito No Secondary Verification Process Completed: Yes Hospitalized since last visit: No Patient Requires Transmission-Based Precautions: No Implantable device outside of the clinic excluding No Patient Has Alerts: Yes cellular tissue based products placed in the center Patient Alerts: Patient on Blood Thinner since last visit: Has Dressing in Place as Prescribed: Yes Has Compression in Place as Prescribed: No Pain Present Now: Yes Notes Per patient saturday morning felt burning and pain from left leg more than right leg. Per patient removed compression wraps. Seen [facility] at scheduled appt time yesterday and they applied an unna boot to left leg. Electronic Signature(s) Signed: 10/05/2021 5:44:57 PM By: Deon Pilling RN, BSN Entered By: Deon Pilling on 10/05/2021 08:31:03 -------------------------------------------------------------------------------- Compression Therapy Details Patient Name: Date of Service: [name] [name] 10/05/2021 8:30 A M Medical Record Number: [id number] Patient Account Number: [id number] Date of Birth/Sex: Treating  RN: Jan 07, 1961 (60 y.o. [name] Primary Care Syenna Nazir: Fonnie Jarvis Other Clinician: Referring Tobi Groesbeck: Treating Lynnita Somma/Extender: Dorann Ou in Treatment: 1 Compression Therapy Performed for Wound Assessment: Wound #3 Right,Anterior Lower Leg Performed By: Clinician Deon Pilling, RN Compression Type: Three Layer Post Procedure Diagnosis Same as Pre-procedure Electronic Signature(s) Signed: 10/05/2021 5:44:57 PM By: Deon Pilling RN, BSN Entered By: Deon Pilling on 10/05/2021 09:06:40 -------------------------------------------------------------------------------- Encounter Discharge Information Details Patient Name: Date of Service: [name] [name] 10/05/2021 8:30 A M Medical Record Number: [id number] Patient Account Number: [id number] Date of Birth/Sex: Treating RN: 02/28/1961 (60 y.o. [name] Primary Care Vernecia Umble: Fonnie Jarvis Other Clinician: Referring Emanuel Campos: Treating Kerrigan Glendening/Extender: Dorann Ou in Treatment: 1 Encounter Discharge Information Items Discharge Condition: Stable Ambulatory Status: Ambulatory Discharge Destination: Home Transportation: Private Auto Accompanied By: self Schedule Follow-up Appointment: Yes Clinical Summary of Care: Electronic Signature(s) Signed: 10/05/2021 5:44:57 PM By: Deon Pilling RN, BSN Entered By: Deon Pilling on 10/05/2021 09:07:25 -------------------------------------------------------------------------------- Lower Extremity Assessment Details Patient Name: Date of Service: [name] [name] 10/05/2021 8:30 A M Medical Record Number: [id number] Patient Account Number: [id number] Date of Birth/Sex: Treating RN: 1961/07/20 (60 y.o. [name] Primary Care Neamiah Sciarra: Fonnie Jarvis Other Clinician: Referring Vasiliy Mccarry: Treating Raidon Swanner/Extender: Aloha Gell Weeks in Treatment: 1 Edema  Assessment Assessed: [name]: Yes] Patrice Paradise: Yes] Edema: [Left: Yes] [Right: Yes] Calf Left: Right: Point of Measurement: 34 cm From Medial Instep 48 cm 47 cm Ankle Left: Right: Point of Measurement: 9 cm From Medial Instep 29 cm 29 cm Vascular Assessment Pulses: Dorsalis Pedis Palpable: [Left:Yes] [Right:Yes] Electronic Signature(s) Signed: 10/05/2021 5:44:57 PM By: Deon Pilling RN, BSN Entered By: Deon Pilling on 10/05/2021 08:37:22 -------------------------------------------------------------------------------- Multi Wound Chart Details Patient Name: Date of Service: [name] [name] 10/05/2021 8:30 A M Medical Record Number: [id number] Patient Account Number: [id number] Date of Birth/Sex: Treating RN: [birth year] (60  y.o. [name], [name] Primary Care Josian Lanese: Fonnie Jarvis Other Clinician: Referring Latressa Harries: Treating Jarian Longoria/Extender: Dorann Ou in Treatment: 1 Vital Signs Height(in): 71 Pulse(bpm): 69 Weight(lbs): 216 Blood Pressure(mmHg): 186/95 Body Mass Index(BMI): 30 Temperature(F): 98.3 Respiratory Rate(breaths/min): 20 Photos: Left, Anterior Lower Leg Left, Medial Lower Leg Right, Anterior Lower Leg Wound Location: Gradually Appeared Gradually Appeared Gradually Appeared Wounding Event: Venous Leg Ulcer Venous Leg Ulcer Venous Leg Ulcer Primary Etiology: Chronic Obstructive Pulmonary Chronic Obstructive Pulmonary Chronic Obstructive Pulmonary Comorbid History: Disease (COPD), Deep Vein Disease (COPD), Deep Vein Disease (COPD), Deep Vein Thrombosis, Hypertension, Peripheral Thrombosis, Hypertension, Peripheral Thrombosis, Hypertension, Peripheral Venous Disease, Neuropathy Venous Disease, Neuropathy Venous Disease, Neuropathy 09/14/2021 09/14/2021 09/14/2021 Date Acquired: '1 1 1 ' Weeks of Treatment: Open Open Open Wound Status: 3.8x5x0.1 2x1.3x0.1 1.5x1.3x0.1 Measurements L x W x D (cm) 14.923 2.042 1.532 A (cm)  : rea 1.492 0.204 0.153 Volume (cm) : -132.80% -294.20% -199.80% % Reduction in Area: -132.80% -292.30% -200.00% % Reduction in Volume: Full Thickness Without Exposed Full Thickness Without Exposed Full Thickness Without Exposed Classification: Support Structures Support Structures Support Structures Large Medium Medium Exudate Amount: Serosanguineous Serosanguineous Serosanguineous Exudate Type: red, brown red, brown red, brown Exudate Color: Distinct, outline attached Distinct, outline attached Distinct, outline attached Wound Margin: Large (67-100%) Large (67-100%) Large (67-100%) Granulation Amount: Red, Pink Red, Pink Red, Pink Granulation Quality: Small (1-33%) Small (1-33%) Small (1-33%) Necrotic Amount: Fat Layer (Subcutaneous Tissue): Yes Fat Layer (Subcutaneous Tissue): Yes Fat Layer (Subcutaneous Tissue): Yes Exposed Structures: Fascia: No Fascia: No Fascia: No Tendon: No Tendon: No Tendon: No Muscle: No Muscle: No Muscle: No Joint: No Joint: No Joint: No Bone: No Bone: No Bone: No Small (1-33%) Small (1-33%) Small (1-33%) Epithelialization: macerated periwound along with N/A N/A Assessment Notes: redness and warm to touch. N/A N/A Compression Therapy Procedures Performed: Treatment Notes Wound #1 (Lower Leg) Wound Laterality: Left, Anterior Cleanser Soap and Water Discharge Instruction: May shower and wash wound with dial antibacterial soap and water prior to dressing change. Wound Cleanser Discharge Instruction: Cleanse the wound with wound cleanser prior to applying a clean dressing using gauze sponges, not tissue or cotton balls. Peri-Wound Care Triamcinolone 15 (g) Discharge Instruction: Use triamcinolone 15 (g) as directed Zinc Oxide Ointment 30g tube Discharge Instruction: Apply Zinc Oxide to periwound with each dressing change Sween Lotion (Moisturizing lotion) Discharge Instruction: Apply moisturizing lotion as  directed Topical Primary Dressing Maxorb Extra Calcium Alginate 2x2 in Discharge Instruction: Apply calcium alginate to wound bed as instructed Cutimed Sorbact Swab Discharge Instruction: Apply to wound bed as instructed Secondary Dressing ABD Pad, 8x10 Discharge Instruction: Apply over primary dressing as directed. Secured With Compression Wrap Kerlix Roll 4.5x3.1 (in/yd) Discharge Instruction: Apply Kerlix and Coban compression as directed. Coban Self-Adherent Wrap 4x5 (in/yd) Discharge Instruction: Apply over Kerlix as directed. Compression Stockings Add-Ons Wound #2 (Lower Leg) Wound Laterality: Left, Medial Cleanser Soap and Water Discharge Instruction: May shower and wash wound with dial antibacterial soap and water prior to dressing change. Wound Cleanser Discharge Instruction: Cleanse the wound with wound cleanser prior to applying a clean dressing using gauze sponges, not tissue or cotton balls. Peri-Wound Care Triamcinolone 15 (g) Discharge Instruction: Use triamcinolone 15 (g) as directed Zinc Oxide Ointment 30g tube Discharge Instruction: Apply Zinc Oxide to periwound with each dressing change Sween Lotion (Moisturizing lotion) Discharge Instruction: Apply moisturizing lotion as directed Topical Primary Dressing Maxorb Extra Calcium Alginate 2x2 in Discharge Instruction: Apply calcium alginate to wound bed as instructed  Cutimed Sorbact Swab Discharge Instruction: Apply to wound bed as instructed Secondary Dressing ABD Pad, 8x10 Discharge Instruction: Apply over primary dressing as directed. Secured With Compression Wrap Kerlix Roll 4.5x3.1 (in/yd) Discharge Instruction: Apply Kerlix and Coban compression as directed. Coban Self-Adherent Wrap 4x5 (in/yd) Discharge Instruction: Apply over Kerlix as directed. Compression Stockings Add-Ons Wound #3 (Lower Leg) Wound Laterality: Right, Anterior Cleanser Soap and Water Discharge Instruction: May shower and  wash wound with dial antibacterial soap and water prior to dressing change. Wound Cleanser Discharge Instruction: Cleanse the wound with wound cleanser prior to applying a clean dressing using gauze sponges, not tissue or cotton balls. Peri-Wound Care Triamcinolone 15 (g) Discharge Instruction: Use triamcinolone 15 (g) as directed Zinc Oxide Ointment 30g tube Discharge Instruction: Apply Zinc Oxide to periwound with each dressing change Sween Lotion (Moisturizing lotion) Discharge Instruction: Apply moisturizing lotion as directed Topical Primary Dressing Maxorb Extra Calcium Alginate 2x2 in Discharge Instruction: Apply calcium alginate to wound bed as instructed Cutimed Sorbact Swab Discharge Instruction: Apply to wound bed as instructed Secondary Dressing ABD Pad, 8x10 Discharge Instruction: Apply over primary dressing as directed. Secured With Compression Wrap ThreePress (3 layer compression wrap) Discharge Instruction: Apply three layer compression as directed. Compression Stockings Add-Ons Electronic Signature(s) Signed: 10/05/2021 9:20:48 AM By: Kalman Shan DO Signed: 10/05/2021 5:44:57 PM By: Deon Pilling RN, BSN Entered By: Kalman Shan on 10/05/2021 09:08:23 -------------------------------------------------------------------------------- Multi-Disciplinary Care Plan Details Patient Name: Date of Service: [name] [name] 10/05/2021 8:30 A M Medical Record Number: [id number] Patient Account Number: [id number] Date of Birth/Sex: Treating RN: 01-15-1961 (60 y.o. [name], Tammi Klippel Primary Care Cadence Haslam: Fonnie Jarvis Other Clinician: Referring Tyshana Nishida: Treating Micheline Markes/Extender: Dorann Ou in Treatment: 1 Active Inactive Nutrition Nursing Diagnoses: Potential for alteratiion in Nutrition/Potential for imbalanced nutrition Goals: Patient/caregiver agrees to and verbalizes understanding of need to obtain nutritional  consultation Date Initiated: 09/28/2021 Target Resolution Date: 10/15/2021 Goal Status: Active Interventions: Assess patient nutrition upon admission and as needed per policy Provide education on nutrition Treatment Activities: Patient referred to Primary Care Physician for further nutritional evaluation : 09/28/2021 Notes: Pain, Acute or Chronic Nursing Diagnoses: Pain, acute or chronic: actual or potential Potential alteration in comfort, pain Goals: Patient will verbalize adequate pain control and receive pain control interventions during procedures as needed Date Initiated: 09/28/2021 Target Resolution Date: 10/15/2021 Goal Status: Active Patient/caregiver will verbalize comfort level met Date Initiated: 09/28/2021 Target Resolution Date: 10/15/2021 Goal Status: Active Interventions: Encourage patient to take pain medications as prescribed Provide education on pain management Reposition patient for comfort Treatment Activities: Administer pain control measures as ordered : 09/28/2021 Notes: Wound/Skin Impairment Nursing Diagnoses: Knowledge deficit related to ulceration/compromised skin integrity Goals: Patient/caregiver will verbalize understanding of skin care regimen Date Initiated: 09/28/2021 Target Resolution Date: 10/15/2021 Goal Status: Active Interventions: Assess patient/caregiver ability to perform ulcer/skin care regimen upon admission and as needed Assess ulceration(s) every visit Provide education on smoking Provide education on ulcer and skin care Treatment Activities: Skin care regimen initiated : 09/28/2021 Smoking cessation education : 09/28/2021 Topical wound management initiated : 09/28/2021 Notes: Electronic Signature(s) Signed: 10/05/2021 5:44:57 PM By: Deon Pilling RN, BSN Entered By: Deon Pilling on 10/05/2021 08:43:47 -------------------------------------------------------------------------------- Pain Assessment Details Patient  Name: Date of Service: [name] [name] 10/05/2021 8:30 A M Medical Record Number: [id number] Patient Account Number: [id number] Date of Birth/Sex: Treating RN: September 17, 1961 (60 y.o. [name] Primary Care Shaheed Schmuck: Fonnie Jarvis Other Clinician: Referring Dezarai Prew: Treating Stephenia Vogan/Extender: Aloha Gell  Weeks in Treatment: 1 Active Problems Location of Pain Severity and Description of Pain Patient Has Paino Yes Site Locations Pain Location: Pain in Ulcers Rate the pain. Current Pain Level: 7 Worst Pain Level: 10 Least Pain Level: 0 Tolerable Pain Level: 8 Pain Management and Medication Current Pain Management: Medication: No Cold Application: No Rest: No Massage: No Activity: No T.E.N.S.: No Heat Application: No Leg drop or elevation: No Is the Current Pain Management Adequate: Adequate How does your wound impact your activities of daily livingo Sleep: No Bathing: No Appetite: No Relationship With Others: No Bladder Continence: No Emotions: No Bowel Continence: No Work: No Toileting: No Drive: No Dressing: No Hobbies: No Engineer, maintenance) Signed: 10/05/2021 5:44:57 PM By: Deon Pilling RN, BSN Entered By: Deon Pilling on 10/05/2021 08:31:34 -------------------------------------------------------------------------------- Patient/Caregiver Education Details Patient Name: Date of Service: [name] [name] 11/29/2022andnbsp8:30 A M Medical Record Number: [id number] Patient Account Number: [id number] Date of Birth/Gender: Treating RN: [birth year] (60 y.o. [name] Primary Care Physician: Fonnie Jarvis Other Clinician: Referring Physician: Treating Physician/Extender: Dorann Ou in Treatment: 1 Education Assessment Education Provided To: Patient Education Topics Provided Pain: Handouts: A Guide to Pain Control Methods: Explain/Verbal, Printed Responses: Reinforcements  needed Electronic Signature(s) Signed: 10/05/2021 5:44:57 PM By: Deon Pilling RN, BSN Entered By: Deon Pilling on 10/05/2021 08:44:03 -------------------------------------------------------------------------------- Wound Assessment Details Patient Name: Date of Service: [name] [name] 10/05/2021 8:30 A M Medical Record Number: [id number] Patient Account Number: [id number] Date of Birth/Sex: Treating RN: 29-Jun-1961 (60 y.o. [name] Primary Care Carrington Olazabal: Fonnie Jarvis Other Clinician: Referring Averiana Clouatre: Treating Calaya Gildner/Extender: Aloha Gell Weeks in Treatment: 1 Wound Status Wound Number: 1 Primary Venous Leg Ulcer Etiology: Wound Location: Left, Anterior Lower Leg Wound Open Wounding Event: Gradually Appeared Status: Date Acquired: 09/14/2021 Comorbid Chronic Obstructive Pulmonary Disease (COPD), Deep Vein Weeks Of Treatment: 1 History: Thrombosis, Hypertension, Peripheral Venous Disease, Neuropathy Clustered Wound: No Photos Wound Measurements Length: (cm) 3.8 Width: (cm) 5 Depth: (cm) 0.1 Area: (cm) 14.923 Volume: (cm) 1.492 % Reduction in Area: -132.8% % Reduction in Volume: -132.8% Epithelialization: Small (1-33%) Tunneling: No Undermining: No Wound Description Classification: Full Thickness Without Exposed Support Structures Wound Margin: Distinct, outline attached Exudate Amount: Large Exudate Type: Serosanguineous Exudate Color: red, brown Foul Odor After Cleansing: No Slough/Fibrino Yes Wound Bed Granulation Amount: Large (67-100%) Exposed Structure Granulation Quality: Red, Pink Fascia Exposed: No Necrotic Amount: Small (1-33%) Fat Layer (Subcutaneous Tissue) Exposed: Yes Necrotic Quality: Adherent Slough Tendon Exposed: No Muscle Exposed: No Joint Exposed: No Bone Exposed: No Assessment Notes macerated periwound along with redness and warm to touch. Treatment Notes Wound #1 (Lower Leg) Wound Laterality:  Left, Anterior Cleanser Soap and Water Discharge Instruction: May shower and wash wound with dial antibacterial soap and water prior to dressing change. Wound Cleanser Discharge Instruction: Cleanse the wound with wound cleanser prior to applying a clean dressing using gauze sponges, not tissue or cotton balls. Peri-Wound Care Triamcinolone 15 (g) Discharge Instruction: Use triamcinolone 15 (g) as directed Zinc Oxide Ointment 30g tube Discharge Instruction: Apply Zinc Oxide to periwound with each dressing change Sween Lotion (Moisturizing lotion) Discharge Instruction: Apply moisturizing lotion as directed Topical Primary Dressing Maxorb Extra Calcium Alginate 2x2 in Discharge Instruction: Apply calcium alginate to wound bed as instructed Cutimed Sorbact Swab Discharge Instruction: Apply to wound bed as instructed Secondary Dressing ABD Pad, 8x10 Discharge Instruction: Apply over primary dressing as directed. Secured With Compression Wrap Kerlix Roll 4.5x3.1 (in/yd) Discharge Instruction:  Apply Kerlix and Coban compression as directed. Coban Self-Adherent Wrap 4x5 (in/yd) Discharge Instruction: Apply over Kerlix as directed. Compression Stockings Add-Ons Electronic Signature(s) Signed: 10/05/2021 5:44:57 PM By: Deon Pilling RN, BSN Signed: 10/05/2021 5:53:22 PM By: Baruch Gouty RN, BSN Entered By: Baruch Gouty on 10/05/2021 08:42:59 -------------------------------------------------------------------------------- Wound Assessment Details Patient Name: Date of Service: [name] [name] 10/05/2021 8:30 A M Medical Record Number: [id number] Patient Account Number: [id number] Date of Birth/Sex: Treating RN: 1961/06/11 (60 y.o. [name] Primary Care Adelisa Satterwhite: Fonnie Jarvis Other Clinician: Referring Rayanna Matusik: Treating Joanne Salah/Extender: Aloha Gell Weeks in Treatment: 1 Wound Status Wound Number: 2 Primary Venous Leg  Ulcer Etiology: Wound Location: Left, Medial Lower Leg Wound Open Wounding Event: Gradually Appeared Status: Date Acquired: 09/14/2021 Comorbid Chronic Obstructive Pulmonary Disease (COPD), Deep Vein Weeks Of Treatment: 1 History: Thrombosis, Hypertension, Peripheral Venous Disease, Neuropathy Clustered Wound: No Photos Wound Measurements Length: (cm) 2 Width: (cm) 1.3 Depth: (cm) 0.1 Area: (cm) 2.042 Volume: (cm) 0.204 % Reduction in Area: -294.2% % Reduction in Volume: -292.3% Epithelialization: Small (1-33%) Tunneling: No Undermining: No Wound Description Classification: Full Thickness Without Exposed Support Structures Wound Margin: Distinct, outline attached Exudate Amount: Medium Exudate Type: Serosanguineous Exudate Color: red, brown Foul Odor After Cleansing: No Slough/Fibrino Yes Wound Bed Granulation Amount: Large (67-100%) Exposed Structure Granulation Quality: Red, Pink Fascia Exposed: No Necrotic Amount: Small (1-33%) Fat Layer (Subcutaneous Tissue) Exposed: Yes Necrotic Quality: Adherent Slough Tendon Exposed: No Muscle Exposed: No Joint Exposed: No Bone Exposed: No Treatment Notes Wound #2 (Lower Leg) Wound Laterality: Left, Medial Cleanser Soap and Water Discharge Instruction: May shower and wash wound with dial antibacterial soap and water prior to dressing change. Wound Cleanser Discharge Instruction: Cleanse the wound with wound cleanser prior to applying a clean dressing using gauze sponges, not tissue or cotton balls. Peri-Wound Care Triamcinolone 15 (g) Discharge Instruction: Use triamcinolone 15 (g) as directed Zinc Oxide Ointment 30g tube Discharge Instruction: Apply Zinc Oxide to periwound with each dressing change Sween Lotion (Moisturizing lotion) Discharge Instruction: Apply moisturizing lotion as directed Topical Primary Dressing Maxorb Extra Calcium Alginate 2x2 in Discharge Instruction: Apply calcium alginate to wound bed as  instructed Cutimed Sorbact Swab Discharge Instruction: Apply to wound bed as instructed Secondary Dressing ABD Pad, 8x10 Discharge Instruction: Apply over primary dressing as directed. Secured With Compression Wrap Kerlix Roll 4.5x3.1 (in/yd) Discharge Instruction: Apply Kerlix and Coban compression as directed. Coban Self-Adherent Wrap 4x5 (in/yd) Discharge Instruction: Apply over Kerlix as directed. Compression Stockings Add-Ons Electronic Signature(s) Signed: 10/05/2021 5:44:57 PM By: Deon Pilling RN, BSN Signed: 10/05/2021 5:53:22 PM By: Baruch Gouty RN, BSN Entered By: Baruch Gouty on 10/05/2021 08:42:38 -------------------------------------------------------------------------------- Wound Assessment Details Patient Name: Date of Service: [name] [name] 10/05/2021 8:30 A M Medical Record Number: [id number] Patient Account Number: [id number] Date of Birth/Sex: Treating RN: [birth year] (60 y.o. [name] Primary Care Jeslyn Amsler: Fonnie Jarvis Other Clinician: Referring Bates Collington: Treating Jaylise Peek/Extender: Aloha Gell Weeks in Treatment: 1 Wound Status Wound Number: 3 Primary Venous Leg Ulcer Etiology: Wound Location: Right, Anterior Lower Leg Wound Open Wounding Event: Gradually Appeared Status: Date Acquired: 09/14/2021 Comorbid Chronic Obstructive Pulmonary Disease (COPD), Deep Vein Weeks Of Treatment: 1 History: Thrombosis, Hypertension, Peripheral Venous Disease, Neuropathy Clustered Wound: No Photos Wound Measurements Length: (cm) 1.5 Width: (cm) 1.3 Depth: (cm) 0.1 Area: (cm) 1.532 Volume: (cm) 0.153 % Reduction in Area: -199.8% % Reduction in Volume: -200% Epithelialization: Small (1-33%) Tunneling: No Undermining: No Wound Description Classification: Full  Thickness Without Exposed Support Structures Wound Margin: Distinct, outline attached Exudate Amount: Medium Exudate Type: Serosanguineous Exudate Color:  red, brown Foul Odor After Cleansing: No Slough/Fibrino Yes Wound Bed Granulation Amount: Large (67-100%) Exposed Structure Granulation Quality: Red, Pink Fascia Exposed: No Necrotic Amount: Small (1-33%) Fat Layer (Subcutaneous Tissue) Exposed: Yes Necrotic Quality: Adherent Slough Tendon Exposed: No Muscle Exposed: No Joint Exposed: No Bone Exposed: No Treatment Notes Wound #3 (Lower Leg) Wound Laterality: Right, Anterior Cleanser Soap and Water Discharge Instruction: May shower and wash wound with dial antibacterial soap and water prior to dressing change. Wound Cleanser Discharge Instruction: Cleanse the wound with wound cleanser prior to applying a clean dressing using gauze sponges, not tissue or cotton balls. Peri-Wound Care Triamcinolone 15 (g) Discharge Instruction: Use triamcinolone 15 (g) as directed Zinc Oxide Ointment 30g tube Discharge Instruction: Apply Zinc Oxide to periwound with each dressing change Sween Lotion (Moisturizing lotion) Discharge Instruction: Apply moisturizing lotion as directed Topical Primary Dressing Maxorb Extra Calcium Alginate 2x2 in Discharge Instruction: Apply calcium alginate to wound bed as instructed Cutimed Sorbact Swab Discharge Instruction: Apply to wound bed as instructed Secondary Dressing ABD Pad, 8x10 Discharge Instruction: Apply over primary dressing as directed. Secured With Compression Wrap ThreePress (3 layer compression wrap) Discharge Instruction: Apply three layer compression as directed. Compression Stockings Add-Ons Electronic Signature(s) Signed: 10/05/2021 5:44:57 PM By: Deon Pilling RN, BSN Signed: 10/05/2021 5:53:22 PM By: Baruch Gouty RN, BSN Entered By: Baruch Gouty on 10/05/2021 08:41:51 -------------------------------------------------------------------------------- Vitals Details Patient Name: Date of Service: [name] [name] 10/05/2021 8:30 A M Medical Record Number: [id number] Patient  Account Number: [id number] Date of Birth/Sex: Treating RN: 04/03/1961 (60 y.o. [name] Primary Care Darionna Banke: Fonnie Jarvis Other Clinician: Referring Ella Guillotte: Treating Lincoln Ginley/Extender: Dorann Ou in Treatment: 1 Vital Signs Time Taken: 08:30 Temperature (F): 98.3 Height (in): 71 Pulse (bpm): 64 Weight (lbs): 216 Respiratory Rate (breaths/min): 20 Body Mass Index (BMI): 30.1 Blood Pressure (mmHg): 186/95 Reference Range: 80 - 120 mg / dl Electronic Signature(s) Signed: 10/05/2021 5:44:57 PM By: Deon Pilling RN, BSN Entered By: Deon Pilling on 10/05/2021 08:31:18

## 2021-10-05 NOTE — Progress Notes (Signed)
[name] [name] ([id number]) Visit Report for 10/05/2021 Chief Complaint Document Details Patient Name: Date of Service: [name] [name] 10/05/2021 8:30 A M Medical Record Number: [id number] Patient Account Number: [id number] Date of Birth/Sex: Treating RN: [birth year] (60 y.o. [name] [name] Primary Care Provider: Fonnie Jarvis Other Clinician: Referring Provider: Treating Provider/Extender: Dorann Ou in Treatment: 1 Information Obtained from: Patient Chief Complaint Bilateral lower extremity wounds Electronic Signature(s) Signed: 10/05/2021 9:20:48 AM By: Kalman Shan DO Entered By: Kalman Shan on 10/05/2021 09:08:30 -------------------------------------------------------------------------------- HPI Details Patient Name: Date of Service: [name] [name] 10/05/2021 8:30 A M Medical Record Number: [id number] Patient Account Number: [id number] Date of Birth/Sex: Treating RN: 01-09-1961 (60 y.o. 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 He states the wounds have been present for the past 6 months and wax and wane in healing. He has been seen by vein and vascular for this issue. He has been treated with Unna boots with benefit. He states he started compression stockings when there was 1 wound remaining and now has developed multiple wounds. He currently takes 20 mg of Lasix and has been doing so for years. He states he has been on several rounds of antibiotics for this issue. He last took Bactrim and completed this a  couple days ago. He currently denies signs of infection. 11/29; patient presents for 1 week follow-up. He states that 4 days ago he felt burning to his legs bilaterally and removed the compression wraps. He saw vein and vascular yesterday and was placed in an Unna boot to the left leg. T oday He reports increased redness to the left leg. Electronic Signature(s) Signed: 10/05/2021 9:20:48 AM By: Kalman Shan DO Entered By: Kalman Shan on 10/05/2021 09:10:18 -------------------------------------------------------------------------------- Physical Exam Details Patient Name: Date of Service: [name] [name] 10/05/2021 8:30 A M Medical Record Number: [id number] Patient Account Number: [id number] Date of Birth/Sex: Treating RN: May 17, 1961 (60 y.o. [name] [name] Primary Care Provider: Fonnie Jarvis Other Clinician: Referring Provider: Treating Provider/Extender: Dorann Ou in Treatment: 1 Constitutional respirations regular, non-labored and within target range for patient.. Cardiovascular 2+ dorsalis pedis/posterior tibialis pulses. Psychiatric pleasant and cooperative. Notes Bilateral lower extremity wounds limited to skin breakdown with scant nonviable tissue in the wound bed. 2+ pitting edema to the knees bilaterally. Varicose veins noted. Venous stasis dermatitis bilaterally. Increased erythema to the left leg. Electronic Signature(s) Signed: 10/05/2021 9:20:48 AM By: Kalman Shan DO Entered By: Kalman Shan on 10/05/2021 09:11:34 -------------------------------------------------------------------------------- Physician Orders Details Patient Name: Date of Service: [name] [name] 10/05/2021 8:30 A M Medical Record Number: [id number] Patient Account Number: [id number] Date of Birth/Sex: Treating RN: January 21, 1961 (60 y.o. [name] [name] Primary Care Provider: Fonnie Jarvis Other Clinician: Referring Provider: Treating  Provider/Extender: Dorann Ou in Treatment: 1 Verbal / Phone Orders: No Diagnosis Coding ICD-10 Coding Code Description [id number] Chronic venous hypertension (idiopathic) with ulcer of bilateral lower extremity L97.812 Non-pressure chronic ulcer of other part of right lower leg with fat layer exposed L97.822 Non-pressure chronic ulcer of other part of left lower leg with fat layer exposed F17.210 Nicotine dependence, cigarettes, uncomplicated B84  Essential (primary) hypertension J44.9 Chronic obstructive pulmonary disease, unspecified Follow-up Appointments ppointment in 1 week. - Dr. Heber Campo [name] Tuesday 10/12/2021 Return A Patient to pick up oral antibiotics today. Patient to follow up with primary care provider related to diuretics' pill and fluid retention. Nurse Visit: - Friday morning 10/08/2021 ***Nurse to please ask Dr. Heber [name] to look in at nurse visit to see left leg.**** Bathing/ Shower/ Hygiene May shower with protection but do not get wound dressing(s) wet. Edema Control - Lymphedema / SCD / Other Elevate legs to the level of the heart or above for 30 minutes daily and/or when sitting, a frequency of: - 3-4 times a [place] throughout the [place]. Avoid standing for long periods of time. Exercise regularly Additional Orders / Instructions Stop/Decrease Smoking Wound Treatment Wound #1 - Lower Leg Wound Laterality: Left, Anterior Cleanser: Soap and Water 1 x Per Week Discharge Instructions: May shower and wash wound with dial antibacterial soap and water prior to dressing change. Cleanser: Wound Cleanser 1 x Per Week Discharge Instructions: Cleanse the wound with wound cleanser prior to applying a clean dressing using gauze sponges, not tissue or cotton balls. Peri-Wound Care: Triamcinolone 15 (g) 1 x Per Week Discharge Instructions: Use triamcinolone 15 (g) as directed Peri-Wound Care: Zinc Oxide Ointment 30g tube 1 x Per Week Discharge Instructions:  Apply Zinc Oxide to periwound with each dressing change Peri-Wound Care: Sween Lotion (Moisturizing lotion) 1 x Per Week Discharge Instructions: Apply moisturizing lotion as directed Prim Dressing: Maxorb Extra Calcium Alginate 2x2 in 1 x Per Week ary Discharge Instructions: Apply calcium alginate to wound bed as instructed Prim Dressing: Cutimed Sorbact Swab 1 x Per Week ary Discharge Instructions: Apply to wound bed as instructed Secondary Dressing: ABD Pad, 8x10 1 x Per Week Discharge Instructions: Apply over primary dressing as directed. Compression Wrap: Kerlix Roll 4.5x3.1 (in/yd) 1 x Per Week Discharge Instructions: Apply Kerlix and Coban compression as directed. Compression Wrap: Coban Self-Adherent Wrap 4x5 (in/yd) 1 x Per Week Discharge Instructions: Apply over Kerlix as directed. Wound #2 - Lower Leg Wound Laterality: Left, Medial Cleanser: Soap and Water 1 x Per Week Discharge Instructions: May shower and wash wound with dial antibacterial soap and water prior to dressing change. Cleanser: Wound Cleanser 1 x Per Week Discharge Instructions: Cleanse the wound with wound cleanser prior to applying a clean dressing using gauze sponges, not tissue or cotton balls. Peri-Wound Care: Triamcinolone 15 (g) 1 x Per Week Discharge Instructions: Use triamcinolone 15 (g) as directed Peri-Wound Care: Zinc Oxide Ointment 30g tube 1 x Per Week Discharge Instructions: Apply Zinc Oxide to periwound with each dressing change Peri-Wound Care: Sween Lotion (Moisturizing lotion) 1 x Per Week Discharge Instructions: Apply moisturizing lotion as directed Prim Dressing: Maxorb Extra Calcium Alginate 2x2 in 1 x Per Week ary Discharge Instructions: Apply calcium alginate to wound bed as instructed Prim Dressing: Cutimed Sorbact Swab 1 x Per Week ary Discharge Instructions: Apply to wound bed as instructed Secondary Dressing: ABD Pad, 8x10 1 x Per Week Discharge Instructions: Apply over primary  dressing as directed. Compression Wrap: Kerlix Roll 4.5x3.1 (in/yd) 1 x Per Week Discharge Instructions: Apply Kerlix and Coban compression as directed. Compression Wrap: Coban Self-Adherent Wrap 4x5 (in/yd) 1 x Per Week Discharge Instructions: Apply over Kerlix as directed. Wound #3 - Lower Leg Wound Laterality: Right, Anterior Cleanser: Soap and Water 1 x Per Week Discharge Instructions: May shower and wash wound with dial antibacterial soap and water prior to dressing change. Cleanser: Wound Cleanser  1 x Per Week Discharge Instructions: Cleanse the wound with wound cleanser prior to applying a clean dressing using gauze sponges, not tissue or cotton balls. Peri-Wound Care: Triamcinolone 15 (g) 1 x Per Week Discharge Instructions: Use triamcinolone 15 (g) as directed Peri-Wound Care: Zinc Oxide Ointment 30g tube 1 x Per Week Discharge Instructions: Apply Zinc Oxide to periwound with each dressing change Peri-Wound Care: Sween Lotion (Moisturizing lotion) 1 x Per Week Discharge Instructions: Apply moisturizing lotion as directed Prim Dressing: Maxorb Extra Calcium Alginate 2x2 in 1 x Per Week ary Discharge Instructions: Apply calcium alginate to wound bed as instructed Prim Dressing: Cutimed Sorbact Swab 1 x Per Week ary Discharge Instructions: Apply to wound bed as instructed Secondary Dressing: ABD Pad, 8x10 1 x Per Week Discharge Instructions: Apply over primary dressing as directed. Compression Wrap: ThreePress (3 layer compression wrap) 1 x Per Week Discharge Instructions: Apply three layer compression as directed. Patient Medications llergies: amlodipine, fentanyl, methadone A Notifications Medication Indication Start End 10/05/2021 Keflex DOSE 1 - oral 500 mg capsule - 1 capsule oral Q6H x 10 days Electronic Signature(s) Signed: 10/05/2021 9:20:48 AM By: Kalman Shan DO Previous Signature: 10/05/2021 9:16:53 AM Version By: Kalman Shan DO Entered By: Kalman Shan on 10/05/2021 09:17:21 -------------------------------------------------------------------------------- Problem List Details Patient Name: Date of Service: [name] [name] 10/05/2021 8:30 A M Medical Record Number: [id number] Patient Account Number: [id number] Date of Birth/Sex: Treating RN: Apr 16, 1961 (60 y.o. [name], [name] Primary Care Provider: Fonnie Jarvis Other Clinician: Referring Provider: Treating Provider/Extender: Dorann Ou in Treatment: 1 Active Problems ICD-10 Encounter Code Description Active Date MDM Diagnosis I87.313 Chronic venous hypertension (idiopathic) with ulcer of bilateral lower extremity 09/28/2021 No Yes L97.812 Non-pressure chronic ulcer of other part of right lower leg with fat layer 09/28/2021 No Yes exposed L97.822 Non-pressure chronic ulcer of other part of left lower leg with fat layer exposed11/22/2022 No Yes F17.210 Nicotine dependence, cigarettes, uncomplicated 49/44/9675 No Yes I10 Essential (primary) hypertension 09/28/2021 No Yes J44.9 Chronic obstructive pulmonary disease, unspecified 09/28/2021 No Yes Inactive Problems Resolved Problems Electronic Signature(s) Signed: 10/05/2021 9:20:48 AM By: Kalman Shan DO Entered By: Kalman Shan on 10/05/2021 09:08:16 -------------------------------------------------------------------------------- Progress Note Details Patient Name: Date of Service: DA Y, DA [name] 10/05/2021 8:30 A M Medical Record Number: [id number] Patient Account Number: [id number] Date of Birth/Sex: Treating RN: 10-12-1961 (60 y.o. [name] [name] Primary Care Provider: Fonnie Jarvis Other Clinician: Referring Provider: Treating Provider/Extender: Dorann Ou in Treatment: 1 Subjective Chief Complaint Information obtained from Patient Bilateral lower extremity wounds History of Present Illness (HPI) Admission 09/28/2021 Mr. 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 He states the wounds have been present for the past 6 months and wax and wane in healing. He has been seen by vein and vascular for this issue. He has been treated with Unna boots with benefit. He states he started compression stockings when there was 1 wound remaining and now has developed multiple wounds. He currently takes 20 mg of Lasix and has been doing so for years. He states he has been on several rounds of antibiotics for this issue. He last took Bactrim and completed this a couple days ago. He currently denies signs of infection. 11/29; patient presents for 1 week follow-up. He states that 4 days ago he felt  burning to his legs bilaterally and removed the compression wraps. He saw vein and vascular yesterday and was placed in an Unna boot to the left leg. T oday He reports increased redness to the left leg. Patient History Information obtained from Patient, Chart. Family History Cancer - Mother, Diabetes - Father,Siblings, Heart Disease - Mother, Hypertension - Mother, No family history of Kidney Disease, Lung Disease, Seizures, Stroke, Thyroid Problems, Tuberculosis. Social History Current every [place] smoker - 1 1/2 ppd, Marital Status - Divorced, Alcohol Use - Never, Drug Use - No History, Caffeine Use - Daily - coffee. Medical History Eyes Denies history of Cataracts, Glaucoma, Optic Neuritis Ear/Nose/Mouth/Throat Denies history of Chronic sinus problems/congestion, Middle ear problems Hematologic/Lymphatic Denies history of Anemia, Hemophilia, Human Immunodeficiency Virus, Lymphedema, Sickle Cell Disease Respiratory Patient has history of Chronic Obstructive Pulmonary Disease (COPD) Denies history of Aspiration, Asthma, Pneumothorax, Sleep Apnea,  Tuberculosis Cardiovascular Patient has history of Deep Vein Thrombosis, Hypertension, Peripheral Venous Disease Denies history of Angina, Arrhythmia, Congestive Heart Failure, Coronary Artery Disease, Hypotension, Myocardial Infarction, Peripheral Arterial Disease, Phlebitis, Vasculitis Gastrointestinal Denies history of Cirrhosis , Colitis, Crohnoos, Hepatitis A, Hepatitis B, Hepatitis C Endocrine Denies history of Type I Diabetes, Type II Diabetes Genitourinary Denies history of End Stage Renal Disease Immunological Denies history of Lupus Erythematosus, Raynaudoos, Scleroderma Integumentary (Skin) Denies history of History of Burn Musculoskeletal Denies history of Gout, Rheumatoid Arthritis, Osteoarthritis, Osteomyelitis Neurologic Patient has history of Neuropathy - left sciatic nerve pain Denies history of Dementia, Quadriplegia, Paraplegia, Seizure Disorder Oncologic Denies history of Received Chemotherapy, Received Radiation Psychiatric Denies history of Anorexia/bulimia, Confinement Anxiety Hospitalization/Surgery History - Spinal Surgery over 20 years ago. Medical A Surgical History Notes nd Constitutional Symptoms (General Health) Stroke 2016 Migraine Cardiovascular PE Psychiatric Anxiety Objective Constitutional respirations regular, non-labored and within target range for patient.. Vitals Time Taken: 8:30 AM, Height: 71 in, Weight: 216 lbs, BMI: 30.1, Temperature: 98.3 F, Pulse: 64 bpm, Respiratory Rate: 20 breaths/min, Blood Pressure: 186/95 mmHg. Cardiovascular 2+ dorsalis pedis/posterior tibialis pulses. Psychiatric pleasant and cooperative. General Notes: Bilateral lower extremity wounds limited to skin breakdown with scant nonviable tissue in the wound bed. 2+ pitting edema to the knees bilaterally. Varicose veins noted. Venous stasis dermatitis bilaterally. Increased erythema to the left leg. Integumentary (Hair, Skin) Wound #1 status is Open.  Original cause of wound was Gradually Appeared. The date acquired was: 09/14/2021. The wound has been in treatment 1 weeks. The wound is located on the Left,Anterior Lower Leg. The wound measures 3.8cm length x 5cm width x 0.1cm depth; 14.923cm^2 area and 1.492cm^3 volume. There is Fat Layer (Subcutaneous Tissue) exposed. There is no tunneling or undermining noted. There is a large amount of serosanguineous drainage noted. The wound margin is distinct with the outline attached to the wound base. There is large (67-100%) red, pink granulation within the wound bed. There is a small (1-33%) amount of necrotic tissue within the wound bed including Adherent Slough. General Notes: macerated periwound along with redness and warm to touch. Wound #2 status is Open. Original cause of wound was Gradually Appeared. The date acquired was: 09/14/2021. The wound has been in treatment 1 weeks. The wound is located on the Left,Medial Lower Leg. The wound measures 2cm length x 1.3cm width x 0.1cm depth; 2.042cm^2 area and 0.204cm^3 volume. There is Fat Layer (Subcutaneous Tissue) exposed. There is no tunneling or undermining noted. There is a medium amount of serosanguineous drainage noted. The wound margin is distinct with the outline attached to the  wound base. There is large (67-100%) red, pink granulation within the wound bed. There is a small (1- 33%) amount of necrotic tissue within the wound bed including Adherent Slough. Wound #3 status is Open. Original cause of wound was Gradually Appeared. The date acquired was: 09/14/2021. The wound has been in treatment 1 weeks. The wound is located on the Right,Anterior Lower Leg. The wound measures 1.5cm length x 1.3cm width x 0.1cm depth; 1.532cm^2 area and 0.153cm^3 volume. There is Fat Layer (Subcutaneous Tissue) exposed. There is no tunneling or undermining noted. There is a medium amount of serosanguineous drainage noted. The wound margin is distinct with the outline  attached to the wound base. There is large (67-100%) red, pink granulation within the wound bed. There is a small (1-33%) amount of necrotic tissue within the wound bed including Adherent Slough. Assessment Active Problems ICD-10 Chronic venous hypertension (idiopathic) with ulcer of bilateral lower extremity Non-pressure chronic ulcer of other part of right lower leg with fat layer exposed Non-pressure chronic ulcer of other part of left lower leg with fat layer exposed Nicotine dependence, cigarettes, uncomplicated Essential (primary) hypertension Chronic obstructive pulmonary disease, unspecified Patient took the wraps off 4 days ago because he felt a burning sensation to his legs. He was replaced by an Haematologist yesterday at [facility]. T oday he has increased redness to the left leg. At this time I will treat for cellulitis with Keflex. We will place a light compression to the left leg with Kerlix/Coban. I recommended continuing the 3 layer to the right leg. Under the wraps we will change the dressing to calcium alginate with sorbact to see if this helps with the burning sensation. Patient will follow-up Friday for a nurse visit to reassess his wounds and do a wrap change. I also recommended he follow-up with his primary care physician to increase the diuretic dose to help with fluid removal. He expressed understanding. Procedures Wound #3 Pre-procedure diagnosis of Wound #3 is a Venous Leg Ulcer located on the Right,Anterior Lower Leg . There was a Three Layer Compression Therapy Procedure by Deon Pilling, RN. Post procedure Diagnosis Wound #3: Same as Pre-Procedure Plan Follow-up Appointments: Return Appointment in 1 week. - Dr. Heber Hockley Tuesday 10/12/2021 Patient to pick up oral antibiotics today. Patient to follow up with primary care provider related to diuretics' pill and fluid retention. Nurse Visit: - Friday morning 10/08/2021 ***Nurse to please ask Dr. Heber Reedy to look in at nurse visit  to see left leg.**** Bathing/ Shower/ Hygiene: May shower with protection but do not get wound dressing(s) wet. Edema Control - Lymphedema / SCD / Other: Elevate legs to the level of the heart or above for 30 minutes daily and/or when sitting, a frequency of: - 3-4 times a [place] throughout the [place]. Avoid standing for long periods of time. Exercise regularly Additional Orders / Instructions: Stop/Decrease Smoking The following medication(s) was prescribed: Keflex oral 500 mg capsule 1 1 capsule oral Q6H x 10 days starting 10/05/2021 WOUND #1: - Lower Leg Wound Laterality: Left, Anterior Cleanser: Soap and Water 1 x Per Week/ Discharge Instructions: May shower and wash wound with dial antibacterial soap and water prior to dressing change. Cleanser: Wound Cleanser 1 x Per Week/ Discharge Instructions: Cleanse the wound with wound cleanser prior to applying a clean dressing using gauze sponges, not tissue or cotton balls. Peri-Wound Care: Triamcinolone 15 (g) 1 x Per Week/ Discharge Instructions: Use triamcinolone 15 (g) as directed Peri-Wound Care: Zinc Oxide Ointment 30g tube 1  x Per Week/ Discharge Instructions: Apply Zinc Oxide to periwound with each dressing change Peri-Wound Care: Sween Lotion (Moisturizing lotion) 1 x Per Week/ Discharge Instructions: Apply moisturizing lotion as directed Prim Dressing: Maxorb Extra Calcium Alginate 2x2 in 1 x Per Week/ ary Discharge Instructions: Apply calcium alginate to wound bed as instructed Prim Dressing: Cutimed Sorbact Swab 1 x Per Week/ ary Discharge Instructions: Apply to wound bed as instructed Secondary Dressing: ABD Pad, 8x10 1 x Per Week/ Discharge Instructions: Apply over primary dressing as directed. Com pression Wrap: Kerlix Roll 4.5x3.1 (in/yd) 1 x Per Week/ Discharge Instructions: Apply Kerlix and Coban compression as directed. Com pression Wrap: Coban Self-Adherent Wrap 4x5 (in/yd) 1 x Per Week/ Discharge Instructions: Apply over  Kerlix as directed. WOUND #2: - Lower Leg Wound Laterality: Left, Medial Cleanser: Soap and Water 1 x Per Week/ Discharge Instructions: May shower and wash wound with dial antibacterial soap and water prior to dressing change. Cleanser: Wound Cleanser 1 x Per Week/ Discharge Instructions: Cleanse the wound with wound cleanser prior to applying a clean dressing using gauze sponges, not tissue or cotton balls. Peri-Wound Care: Triamcinolone 15 (g) 1 x Per Week/ Discharge Instructions: Use triamcinolone 15 (g) as directed Peri-Wound Care: Zinc Oxide Ointment 30g tube 1 x Per Week/ Discharge Instructions: Apply Zinc Oxide to periwound with each dressing change Peri-Wound Care: Sween Lotion (Moisturizing lotion) 1 x Per Week/ Discharge Instructions: Apply moisturizing lotion as directed Prim Dressing: Maxorb Extra Calcium Alginate 2x2 in 1 x Per Week/ ary Discharge Instructions: Apply calcium alginate to wound bed as instructed Prim Dressing: Cutimed Sorbact Swab 1 x Per Week/ ary Discharge Instructions: Apply to wound bed as instructed Secondary Dressing: ABD Pad, 8x10 1 x Per Week/ Discharge Instructions: Apply over primary dressing as directed. Com pression Wrap: Kerlix Roll 4.5x3.1 (in/yd) 1 x Per Week/ Discharge Instructions: Apply Kerlix and Coban compression as directed. Com pression Wrap: Coban Self-Adherent Wrap 4x5 (in/yd) 1 x Per Week/ Discharge Instructions: Apply over Kerlix as directed. WOUND #3: - Lower Leg Wound Laterality: Right, Anterior Cleanser: Soap and Water 1 x Per Week/ Discharge Instructions: May shower and wash wound with dial antibacterial soap and water prior to dressing change. Cleanser: Wound Cleanser 1 x Per Week/ Discharge Instructions: Cleanse the wound with wound cleanser prior to applying a clean dressing using gauze sponges, not tissue or cotton balls. Peri-Wound Care: Triamcinolone 15 (g) 1 x Per Week/ Discharge Instructions: Use triamcinolone 15 (g) as  directed Peri-Wound Care: Zinc Oxide Ointment 30g tube 1 x Per Week/ Discharge Instructions: Apply Zinc Oxide to periwound with each dressing change Peri-Wound Care: Sween Lotion (Moisturizing lotion) 1 x Per Week/ Discharge Instructions: Apply moisturizing lotion as directed Prim Dressing: Maxorb Extra Calcium Alginate 2x2 in 1 x Per Week/ ary Discharge Instructions: Apply calcium alginate to wound bed as instructed Prim Dressing: Cutimed Sorbact Swab 1 x Per Week/ ary Discharge Instructions: Apply to wound bed as instructed Secondary Dressing: ABD Pad, 8x10 1 x Per Week/ Discharge Instructions: Apply over primary dressing as directed. Com pression Wrap: ThreePress (3 layer compression wrap) 1 x Per Week/ Discharge Instructions: Apply three layer compression as directed. 1. Keflex 2. Sorbact and calcium alginate under compression 3. Follow-up Friday for nurse visit and next week for doctor visit Electronic Signature(s) Signed: 10/05/2021 9:20:48 AM By: Kalman Shan DO Entered By: Kalman Shan on 10/05/2021 09:19:55 -------------------------------------------------------------------------------- HxROS Details Patient Name: Date of Service: DA Y, DA [name] 10/05/2021 8:30 A M Medical Record Number:  [id number] Patient Account Number: [id number] Date of Birth/Sex: Treating RN: 04/10/1961 (60 y.o. [name] [name] Primary Care Provider: Fonnie Jarvis Other Clinician: Referring Provider: Treating Provider/Extender: Dorann Ou in Treatment: 1 Information Obtained From Patient Chart Constitutional Symptoms (General Health) Medical History: Past Medical History Notes: Stroke 2016 Migraine Eyes Medical History: Negative for: Cataracts; Glaucoma; Optic Neuritis Ear/Nose/Mouth/Throat Medical History: Negative for: Chronic sinus problems/congestion; Middle ear problems Hematologic/Lymphatic Medical History: Negative for: Anemia; Hemophilia;  Human Immunodeficiency Virus; Lymphedema; Sickle Cell Disease Respiratory Medical History: Positive for: Chronic Obstructive Pulmonary Disease (COPD) Negative for: Aspiration; Asthma; Pneumothorax; Sleep Apnea; Tuberculosis Cardiovascular Medical History: Positive for: Deep Vein Thrombosis; Hypertension; Peripheral Venous Disease Negative for: Angina; Arrhythmia; Congestive Heart Failure; Coronary Artery Disease; Hypotension; Myocardial Infarction; Peripheral Arterial Disease; Phlebitis; Vasculitis Past Medical History Notes: PE Gastrointestinal Medical History: Negative for: Cirrhosis ; Colitis; Crohns; Hepatitis A; Hepatitis B; Hepatitis C Endocrine Medical History: Negative for: Type I Diabetes; Type II Diabetes Genitourinary Medical History: Negative for: End Stage Renal Disease Immunological Medical History: Negative for: Lupus Erythematosus; Raynauds; Scleroderma Integumentary (Skin) Medical History: Negative for: History of Burn Musculoskeletal Medical History: Negative for: Gout; Rheumatoid Arthritis; Osteoarthritis; Osteomyelitis Neurologic Medical History: Positive for: Neuropathy - left sciatic nerve pain Negative for: Dementia; Quadriplegia; Paraplegia; Seizure Disorder Oncologic Medical History: Negative for: Received Chemotherapy; Received Radiation Psychiatric Medical History: Negative for: Anorexia/bulimia; Confinement Anxiety Past Medical History Notes: Anxiety Immunizations Pneumococcal Vaccine: Received Pneumococcal Vaccination: No Implantable Devices No devices added Hospitalization / Surgery History Type of Hospitalization/Surgery Spinal Surgery over 20 years ago Family and Social History Cancer: Yes - Mother; Diabetes: Yes - Father,Siblings; Heart Disease: Yes - Mother; Hypertension: Yes - Mother; Kidney Disease: No; Lung Disease: No; Seizures: No; Stroke: No; Thyroid Problems: No; Tuberculosis: No; Current every [place] smoker - 1 1/2 ppd;  Marital Status - Divorced; Alcohol Use: Never; Drug Use: No History; Caffeine Use: Daily - coffee; Financial Concerns: No; Food, Clothing or Shelter Needs: No; Support System Lacking: No; Transportation Concerns: No Electronic Signature(s) Signed: 10/05/2021 9:20:48 AM By: Kalman Shan DO Signed: 10/05/2021 5:44:57 PM By: Deon Pilling RN, BSN Entered By: Kalman Shan on 10/05/2021 09:10:24 -------------------------------------------------------------------------------- SuperBill Details Patient Name: Date of Service: [name] [name] 10/05/2021 Medical Record Number: [id number] Patient Account Number: [id number] Date of Birth/Sex: Treating RN: 09-20-1961 (60 y.o. [name], [name] Primary Care Provider: Fonnie Jarvis Other Clinician: Referring Provider: Treating Provider/Extender: Dorann Ou in Treatment: 1 Diagnosis Coding ICD-10 Codes Code Description [phone] Chronic venous hypertension (idiopathic) with ulcer of bilateral lower extremity L97.812 Non-pressure chronic ulcer of other part of right lower leg with fat layer exposed L97.822 Non-pressure chronic ulcer of other part of left lower leg with fat layer exposed F17.210 Nicotine dependence, cigarettes, uncomplicated F79 Essential (primary) hypertension J44.9 Chronic obstructive pulmonary disease, unspecified L03.116 Cellulitis of left lower limb Facility Procedures CPT4 Code: [id number] Description: (Facility Use Only) [phone] - APPLY MULTLAY COMPRS LWR RT LEG Modifier: Quantity: 1 Physician Procedures : CPT4 Code Description Modifier [id number] - WC PHYS LEVEL 4 - EST PT ICD-10 Diagnosis Description L03.116 Cellulitis of left lower limb L97.812 Non-pressure chronic ulcer of other part of right lower leg with fat layer exposed L97.822 Non-pressure  chronic ulcer of other part of left lower leg with fat layer exposed I87.313 Chronic venous hypertension (idiopathic) with ulcer of  bilateral lower extremity Quantity: 1 Electronic Signature(s) Signed: 10/05/2021 9:20:48 AM By: Kalman Shan DO Entered By: Kalman Shan on 10/05/2021 09:20:29

## 2021-10-05 NOTE — Telephone Encounter (Signed)
Contacted pt to try and schedule appt for unna boot change and pt refused. Offered to see if we could get [facility] and pt refused.

## 2021-10-07 DIAGNOSIS — Z419 Encounter for procedure for purposes other than remedying health state, unspecified: Secondary | ICD-10-CM | POA: Diagnosis not present

## 2021-10-08 ENCOUNTER — Other Ambulatory Visit: Payer: Self-pay

## 2021-10-08 ENCOUNTER — Encounter (HOSPITAL_BASED_OUTPATIENT_CLINIC_OR_DEPARTMENT_OTHER): Payer: Medicaid Other | Attending: Internal Medicine | Admitting: Internal Medicine

## 2021-10-08 DIAGNOSIS — L97812 Non-pressure chronic ulcer of other part of right lower leg with fat layer exposed: Secondary | ICD-10-CM | POA: Diagnosis not present

## 2021-10-08 DIAGNOSIS — L97822 Non-pressure chronic ulcer of other part of left lower leg with fat layer exposed: Secondary | ICD-10-CM | POA: Insufficient documentation

## 2021-10-08 DIAGNOSIS — Z7689 Persons encountering health services in other specified circumstances: Secondary | ICD-10-CM | POA: Diagnosis not present

## 2021-10-12 ENCOUNTER — Encounter (HOSPITAL_BASED_OUTPATIENT_CLINIC_OR_DEPARTMENT_OTHER): Payer: Medicaid Other | Admitting: Internal Medicine

## 2021-10-12 ENCOUNTER — Other Ambulatory Visit: Payer: Self-pay

## 2021-10-12 DIAGNOSIS — L97812 Non-pressure chronic ulcer of other part of right lower leg with fat layer exposed: Secondary | ICD-10-CM | POA: Diagnosis not present

## 2021-10-12 DIAGNOSIS — L97822 Non-pressure chronic ulcer of other part of left lower leg with fat layer exposed: Secondary | ICD-10-CM | POA: Diagnosis not present

## 2021-10-12 DIAGNOSIS — Z7689 Persons encountering health services in other specified circumstances: Secondary | ICD-10-CM | POA: Diagnosis not present

## 2021-10-12 NOTE — Progress Notes (Addendum)
[name] [name], [name] [name] ([id number]) Visit Report for 10/12/2021 Arrival Information Details Patient Name: Date of Service: [name] [name] 10/12/2021 8:15 A M Medical Record Number: [id number] Patient Account Number: [id number] Date of Birth/Sex: Treating RN: 03/06/1961 (60 y.o. M) Primary Care Adyn Hoes: Fonnie Jarvis Other Clinician: Referring Infant Zink: Treating Majed Pellegrin/Extender: Dorann Ou in Treatment: 2 Visit Information History Since Last Visit Added or deleted any medications: No Patient Arrived: Walker Any new allergies or adverse reactions: No Arrival Time: 08:14 Had a fall or experienced change in No Accompanied By: self activities of daily living that may affect Transfer Assistance: Manual risk of falls: Patient Identification Verified: Yes Signs or symptoms of abuse/neglect since last visito No Secondary Verification Process Completed: Yes Hospitalized since last visit: No Patient Requires Transmission-Based Precautions: No Implantable device outside of the clinic excluding No Patient Has Alerts: Yes cellular tissue based products placed in the center Patient Alerts: Patient on Blood Thinner since last visit: Has Dressing in Place as Prescribed: Yes Pain Present Now: Yes Electronic Signature(s) Signed: 10/12/2021 8:34:42 AM By: Sandre Kitty Entered By: Sandre Kitty on 10/12/2021 08:15:17 -------------------------------------------------------------------------------- Compression Therapy Details Patient Name: Date of Service: [name] [name] 10/12/2021 8:15 A M Medical Record Number: [id number] Patient Account Number: [id number] Date of Birth/Sex: Treating RN: 1961-03-02 (60 y.o. Ernestene Mention Primary Care Yomaira Solar: Fonnie Jarvis Other Clinician: Referring Brayen Bunn: Treating Sharada Albornoz/Extender: Dorann Ou in Treatment: 2 Compression Therapy Performed for Wound Assessment: Wound #1 Left,Anterior Lower  Leg Performed By: Clinician Baruch Gouty, RN Compression Type: Three Layer Post Procedure Diagnosis Same as Pre-procedure Electronic Signature(s) Signed: 10/12/2021 4:29:15 PM By: Baruch Gouty RN, BSN Entered By: Baruch Gouty on 10/12/2021 09:38:03 -------------------------------------------------------------------------------- Compression Therapy Details Patient Name: Date of Service: [name] [name] 10/12/2021 8:15 A M Medical Record Number: [id number] Patient Account Number: [id number] Date of Birth/Sex: Treating RN: 1961/03/11 (60 y.o. Ernestene Mention Primary Care Camron Essman: Fonnie Jarvis Other Clinician: Referring Foster Frericks: Treating Shuaib Corsino/Extender: Dorann Ou in Treatment: 2 Compression Therapy Performed for Wound Assessment: Wound #3 Right,Anterior Lower Leg Performed By: Clinician Baruch Gouty, RN Compression Type: Three Layer Post Procedure Diagnosis Same as Pre-procedure Electronic Signature(s) Signed: 10/12/2021 4:29:15 PM By: Baruch Gouty RN, BSN Entered By: Baruch Gouty on 10/12/2021 09:38:03 -------------------------------------------------------------------------------- Encounter Discharge Information Details Patient Name: Date of Service: [name] [name] 10/12/2021 8:15 A M Medical Record Number: [id number] Patient Account Number: [id number] Date of Birth/Sex: Treating RN: 06/25/1961 (60 y.o. Ernestene Mention Primary Care Rasheka Denard: Fonnie Jarvis Other Clinician: Referring Bera Pinela: Treating Charlita Brian/Extender: Dorann Ou in Treatment: 2 Encounter Discharge Information Items Post Procedure Vitals Discharge Condition: Stable Temperature (F): 98.4 Ambulatory Status: Walker Pulse (bpm): 69 Discharge Destination: Home Respiratory Rate (breaths/min): 20 Transportation: Other Blood Pressure (mmHg): 153/82 Accompanied By: self Schedule Follow-up Appointment: Yes Clinical  Summary of Care: Patient Declined Notes Medicaid transportation Electronic Signature(s) Signed: 10/12/2021 4:29:15 PM By: Baruch Gouty RN, BSN Entered By: Baruch Gouty on 10/12/2021 09:40:37 -------------------------------------------------------------------------------- Lower Extremity Assessment Details Patient Name: Date of Service: [name] [name] 10/12/2021 8:15 A M Medical Record Number: [id number] Patient Account Number: [id number] Date of Birth/Sex: Treating RN: [birth year] (60 y.o. Ernestene Mention Primary Care Lyndie Vanderloop: Fonnie Jarvis Other Clinician: Referring Maile Linford: Treating Sonal Dorwart/Extender: Aloha Gell Weeks in Treatment: 2 Edema Assessment Assessed: [Left: No] [Right: No] Edema: [Left: Yes] [Right: Yes] Calf Left: Right: Point of Measurement: 34 cm From Medial Instep 47.5 cm 48.5 cm Ankle  Left: Right: Point of Measurement: 9 cm From Medial Instep 29 cm 28.5 cm Vascular Assessment Pulses: Dorsalis Pedis Palpable: [Left:No] [Right:No] Electronic Signature(s) Signed: 10/12/2021 4:29:15 PM By: Baruch Gouty RN, BSN Entered By: Baruch Gouty on 10/12/2021 08:40:42 -------------------------------------------------------------------------------- Multi Wound Chart Details Patient Name: Date of Service: [name] [name] 10/12/2021 8:15 A M Medical Record Number: [id number] Patient Account Number: [id number] Date of Birth/Sex: Treating RN: 1961-07-24 (60 y.o. M) Primary Care Lavena Loretto: Fonnie Jarvis Other Clinician: Referring Arvine Clayburn: Treating Junnie Loschiavo/Extender: Dorann Ou in Treatment: 2 Vital Signs Height(in): 71 Pulse(bpm): 35 Weight(lbs): 216 Blood Pressure(mmHg): 153/82 Body Mass Index(BMI): 30 Temperature(F): 98.4 Respiratory Rate(breaths/min): 20 Photos: Left, Anterior Lower Leg Left, Medial Lower Leg Right, Anterior Lower Leg Wound Location: Gradually Appeared Gradually Appeared  Gradually Appeared Wounding Event: Venous Leg Ulcer Venous Leg Ulcer Venous Leg Ulcer Primary Etiology: Chronic Obstructive Pulmonary Chronic Obstructive Pulmonary Chronic Obstructive Pulmonary Comorbid History: Disease (COPD), Deep Vein Disease (COPD), Deep Vein Disease (COPD), Deep Vein Thrombosis, Hypertension, Peripheral Thrombosis, Hypertension, Peripheral Thrombosis, Hypertension, Peripheral Venous Disease, Neuropathy Venous Disease, Neuropathy Venous Disease, Neuropathy 09/14/2021 09/14/2021 09/14/2021 Date Acquired: _0 Weeks of Treatment: Open Open Open Wound Status: 3.5x4.5x0.1 1.3x1x0.1 0.7x0.4x0.1 Measurements L x W x D (cm) 12.37 1.021 0.22 A (cm) : rea 1.237 0.102 0.022 Volume (cm) : -93.00% -97.10% 56.90% % Reduction in Area: -93.00% -96.20% 56.90% % Reduction in Volume: Full Thickness Without Exposed Full Thickness Without Exposed Full Thickness Without Exposed Classification: Support Structures Support Structures Support Structures Medium Medium Medium Exudate Amount: Serosanguineous Serosanguineous Serosanguineous Exudate Type: red, brown red, brown red, brown Exudate Color: Flat and Intact Flat and Intact Flat and Intact Wound Margin: Small (1-33%) Small (1-33%) Small (1-33%) Granulation Amount: Pink Pink Pink Granulation Quality: Large (67-100%) Large (67-100%) Large (67-100%) Necrotic Amount: Fat Layer (Subcutaneous Tissue): Yes Fat Layer (Subcutaneous Tissue): Yes Fat Layer (Subcutaneous Tissue): Yes Exposed Structures: Fascia: No Fascia: No Fascia: No Tendon: No Tendon: No Tendon: No Muscle: No Muscle: No Muscle: No Joint: No Joint: No Joint: No Bone: No Bone: No Bone: No Small (1-33%) Small (1-33%) Small (1-33%) Epithelialization: Debridement - Selective/Open Wound Debridement - Selective/Open Wound Debridement - Excisional Debridement: Pre-procedure Verification/Time Out 09:00 09:00 09:00 Taken: Other Other Other Pain  Control: [employer], [employer] Tissue Debrided: Non-Viable Tissue Non-Viable Tissue Skin/Subcutaneous Tissue Level: 4 1.3 0.28 Debridement A (sq cm): rea Curette Curette Curette Instrument: Minimum Minimum Minimum Bleeding: Pressure Pressure Pressure Hemostasis A chieved: _1 Procedural Pain: _2 Post Procedural Pain: Procedure was tolerated well Procedure was tolerated well Procedure was tolerated well Debridement Treatment Response: 3.5x4.5x0.1 1.3x1x0.1 0.7x0.4x0.1 Post Debridement Measurements L x W x D (cm) 1.237 0.102 0.022 Post Debridement Volume: (cm) Debridement Debridement Debridement Procedures Performed: Treatment Notes Electronic Signature(s) Signed: 10/12/2021 9:56:32 AM By: Kalman Shan DO Entered By: Kalman Shan on 10/12/2021 09:15:38 -------------------------------------------------------------------------------- Multi-Disciplinary Care Plan Details Patient Name: Date of Service: [name] [name] 10/12/2021 8:15 A M Medical Record Number: [id number] Patient Account Number: [id number] Date of Birth/Sex: Treating RN: Dec 06, 1960 (60 y.o. Ernestene Mention Primary Care Marisella Puccio: Fonnie Jarvis Other Clinician: Referring Ted Goodner: Treating Anvi Mangal/Extender: Dorann Ou in Treatment: 2 Active Inactive Nutrition Nursing Diagnoses: Potential for alteratiion in Nutrition/Potential for imbalanced nutrition Goals: Patient/caregiver agrees to and verbalizes understanding of need to obtain nutritional consultation Date Initiated: 09/28/2021 Target Resolution Date: 10/15/2021 Goal Status: Active Interventions: Assess patient nutrition upon admission and as needed per policy Provide education  on nutrition Treatment Activities: Education provided on Nutrition : 10/08/2021 Patient referred to Primary Care Physician for further nutritional evaluation : 09/28/2021 Notes: Pain, Acute or Chronic Nursing  Diagnoses: Pain, acute or chronic: actual or potential Potential alteration in comfort, pain Goals: Patient will verbalize adequate pain control and receive pain control interventions during procedures as needed Date Initiated: 09/28/2021 Target Resolution Date: 10/15/2021 Goal Status: Active Patient/caregiver will verbalize comfort level met Date Initiated: 09/28/2021 Target Resolution Date: 10/15/2021 Goal Status: Active Interventions: Encourage patient to take pain medications as prescribed Provide education on pain management Reposition patient for comfort Treatment Activities: Administer pain control measures as ordered : 09/28/2021 Notes: Wound/Skin Impairment Nursing Diagnoses: Knowledge deficit related to ulceration/compromised skin integrity Goals: Patient/caregiver will verbalize understanding of skin care regimen Date Initiated: 09/28/2021 Target Resolution Date: 10/15/2021 Goal Status: Active Interventions: Assess patient/caregiver ability to perform ulcer/skin care regimen upon admission and as needed Assess ulceration(s) every visit Provide education on smoking Provide education on ulcer and skin care Treatment Activities: Skin care regimen initiated : 09/28/2021 Smoking cessation education : 09/28/2021 Topical wound management initiated : 09/28/2021 Notes: Electronic Signature(s) Signed: 10/12/2021 4:29:15 PM By: Baruch Gouty RN, BSN Entered By: Baruch Gouty on 10/12/2021 09:37:05 -------------------------------------------------------------------------------- Pain Assessment Details Patient Name: Date of Service: [name] [name] 10/12/2021 8:15 A M Medical Record Number: [id number] Patient Account Number: [id number] Date of Birth/Sex: Treating RN: May 19, 1961 (60 y.o. M) Primary Care Bennette Hasty: Fonnie Jarvis Other Clinician: Referring Azilee Pirro: Treating Darl Brisbin/Extender: Dorann Ou in Treatment: 2 Active  Problems Location of Pain Severity and Description of Pain Patient Has Paino Yes Site Locations Pain Location: Pain Location: Generalized Pain, Pain in Ulcers With Dressing Change: Yes Duration of the Pain. Constant / Intermittento Constant Rate the pain. Current Pain Level: 10 Worst Pain Level: 10 Least Pain Level: 7 Character of Pain Describe the Pain: Aching Pain Management and Medication Current Pain Management: Medication: Yes Is the Current Pain Management Adequate: Adequate How does your wound impact your activities of daily livingo Sleep: No Bathing: No Appetite: No Relationship With Others: No Bladder Continence: No Emotions: Yes Bowel Continence: No Hobbies: Yes Toileting: No Dressing: No Notes reports wound pain as well as chronic lower back pain Electronic Signature(s) Signed: 10/12/2021 4:29:15 PM By: Baruch Gouty RN, BSN Previous Signature: 10/12/2021 8:34:42 AM Version By: Sandre Kitty Entered By: Baruch Gouty on 10/12/2021 08:44:31 -------------------------------------------------------------------------------- Patient/Caregiver Education Details Patient Name: Date of Service: [name] [name] 12/6/2022andnbsp8:15 A M Medical Record Number: [id number] Patient Account Number: [id number] Date of Birth/Gender: Treating RN: [birth year] (60 y.o. Ernestene Mention Primary Care Physician: Fonnie Jarvis Other Clinician: Referring Physician: Treating Physician/Extender: Dorann Ou in Treatment: 2 Education Assessment Education Provided To: Patient Education Topics Provided Pain: Methods: Explain/Verbal Responses: Reinforcements needed, State content correctly Smoking and Wound Healing: Methods: Explain/Verbal Responses: Reinforcements needed, State content correctly Wound/Skin Impairment: Methods: Explain/Verbal Responses: Reinforcements needed, State content correctly Electronic Signature(s) Signed:  10/12/2021 4:29:15 PM By: Baruch Gouty RN, BSN Entered By: Baruch Gouty on 10/12/2021 09:37:35 -------------------------------------------------------------------------------- Wound Assessment Details Patient Name: Date of Service: [name] [name] 10/12/2021 8:15 A M Medical Record Number: [id number] Patient Account Number: [id number] Date of Birth/Sex: Treating RN: 1961-01-17 (60 y.o. M) Primary Care Darrel Baroni: Fonnie Jarvis Other Clinician: Referring Jerrine Urschel: Treating Kaj Vasil/Extender: Dorann Ou in Treatment: 2 Wound Status Wound Number: 1 Primary Venous Leg Ulcer Etiology: Wound Location: Left, Anterior Lower Leg Wound Open Wounding Event: Gradually Appeared Status:  Date Acquired: 09/14/2021 Comorbid Chronic Obstructive Pulmonary Disease (COPD), Deep Vein Weeks Of Treatment: 2 History: Thrombosis, Hypertension, Peripheral Venous Disease, Neuropathy Clustered Wound: No Photos Wound Measurements Length: (cm) 3.5 Width: (cm) 4.5 Depth: (cm) 0.1 Area: (cm) 12.37 Volume: (cm) 1.237 % Reduction in Area: -93% % Reduction in Volume: -93% Epithelialization: Small (1-33%) Tunneling: No Undermining: No Wound Description Classification: Full Thickness Without Exposed Support Structures Wound Margin: Flat and Intact Exudate Amount: Medium Exudate Type: Serosanguineous Exudate Color: red, brown Foul Odor After Cleansing: No Slough/Fibrino Yes Wound Bed Granulation Amount: Small (1-33%) Exposed Structure Granulation Quality: Pink Fascia Exposed: No Necrotic Amount: Large (67-100%) Fat Layer (Subcutaneous Tissue) Exposed: Yes Necrotic Quality: Adherent Slough Tendon Exposed: No Muscle Exposed: No Joint Exposed: No Bone Exposed: No Treatment Notes Wound #1 (Lower Leg) Wound Laterality: Left, Anterior Cleanser Soap and Water Discharge Instruction: May shower and wash wound with dial antibacterial soap and water prior to dressing  change. Wound Cleanser Discharge Instruction: Cleanse the wound with wound cleanser prior to applying a clean dressing using gauze sponges, not tissue or cotton balls. Peri-Wound Care Triamcinolone 15 (g) Discharge Instruction: Use triamcinolone 15 (g) as directed Sween Lotion (Moisturizing lotion) Discharge Instruction: Apply moisturizing lotion as directed Topical Primary Dressing Maxorb Extra Calcium Alginate 2x2 in Discharge Instruction: Apply calcium alginate to wound bed as instructed Cutimed Sorbact Swab Discharge Instruction: Apply to wound bed as instructed Secondary Dressing ABD Pad, 8x10 Discharge Instruction: Apply over primary dressing as directed. Secured With Compression Wrap ThreePress (3 layer compression wrap) Discharge Instruction: Apply three layer compression as directed. Compression Stockings Add-Ons Electronic Signature(s) Signed: 10/12/2021 4:29:15 PM By: Baruch Gouty RN, BSN Previous Signature: 10/12/2021 8:34:42 AM Version By: Sandre Kitty Entered By: Baruch Gouty on 10/12/2021 08:41:28 -------------------------------------------------------------------------------- Wound Assessment Details Patient Name: Date of Service: [name] [name] 10/12/2021 8:15 A M Medical Record Number: [id number] Patient Account Number: [id number] Date of Birth/Sex: Treating RN: May 05, 1961 (60 y.o. M) Primary Care Tharon Bomar: Fonnie Jarvis Other Clinician: Referring Savior Himebaugh: Treating Ricarda Atayde/Extender: Dorann Ou in Treatment: 2 Wound Status Wound Number: 2 Primary Venous Leg Ulcer Etiology: Wound Location: Left, Medial Lower Leg Wound Open Wounding Event: Gradually Appeared Status: Date Acquired: 09/14/2021 Comorbid Chronic Obstructive Pulmonary Disease (COPD), Deep Vein Weeks Of Treatment: 2 History: Thrombosis, Hypertension, Peripheral Venous Disease, Neuropathy Clustered Wound: No Photos Wound Measurements Length: (cm)  1.3 Width: (cm) 1 Depth: (cm) 0.1 Area: (cm) 1.021 Volume: (cm) 0.102 % Reduction in Area: -97.1% % Reduction in Volume: -96.2% Epithelialization: Small (1-33%) Tunneling: No Undermining: No Wound Description Classification: Full Thickness Without Exposed Support Structures Wound Margin: Flat and Intact Exudate Amount: Medium Exudate Type: Serosanguineous Exudate Color: red, brown Foul Odor After Cleansing: No Slough/Fibrino No Wound Bed Granulation Amount: Small (1-33%) Exposed Structure Granulation Quality: Pink Fascia Exposed: No Necrotic Amount: Large (67-100%) Fat Layer (Subcutaneous Tissue) Exposed: Yes Necrotic Quality: Adherent Slough Tendon Exposed: No Muscle Exposed: No Joint Exposed: No Bone Exposed: No Treatment Notes Wound #2 (Lower Leg) Wound Laterality: Left, Medial Cleanser Soap and Water Discharge Instruction: May shower and wash wound with dial antibacterial soap and water prior to dressing change. Wound Cleanser Discharge Instruction: Cleanse the wound with wound cleanser prior to applying a clean dressing using gauze sponges, not tissue or cotton balls. Peri-Wound Care Triamcinolone 15 (g) Discharge Instruction: Use triamcinolone 15 (g) as directed Sween Lotion (Moisturizing lotion) Discharge Instruction: Apply moisturizing lotion as directed Topical Primary Dressing Maxorb Extra Calcium Alginate 2x2 in Discharge Instruction: Apply  calcium alginate to wound bed as instructed Cutimed Sorbact Swab Discharge Instruction: Apply to wound bed as instructed Secondary Dressing ABD Pad, 8x10 Discharge Instruction: Apply over primary dressing as directed. Secured With Compression Wrap ThreePress (3 layer compression wrap) Discharge Instruction: Apply three layer compression as directed. Compression Stockings Add-Ons Electronic Signature(s) Signed: 10/12/2021 4:29:15 PM By: Baruch Gouty RN, BSN Previous Signature: 10/12/2021 8:34:42 AM Version By:  Sandre Kitty Entered By: Baruch Gouty on 10/12/2021 08:42:28 -------------------------------------------------------------------------------- Wound Assessment Details Patient Name: Date of Service: [name] [name] 10/12/2021 8:15 A M Medical Record Number: [id number] Patient Account Number: [id number] Date of Birth/Sex: Treating RN: January 23, 1961 (60 y.o. M) Primary Care Rockey Guarino: Fonnie Jarvis Other Clinician: Referring Elgar Scoggins: Treating Tasheba Henson/Extender: Dorann Ou in Treatment: 2 Wound Status Wound Number: 3 Primary Venous Leg Ulcer Etiology: Wound Location: Right, Anterior Lower Leg Wound Open Wounding Event: Gradually Appeared Status: Date Acquired: 09/14/2021 Comorbid Chronic Obstructive Pulmonary Disease (COPD), Deep Vein Weeks Of Treatment: 2 History: Thrombosis, Hypertension, Peripheral Venous Disease, Neuropathy Clustered Wound: No Photos Wound Measurements Length: (cm) 0.7 Width: (cm) 0.4 Depth: (cm) 0.1 Area: (cm) 0.22 Volume: (cm) 0.022 % Reduction in Area: 56.9% % Reduction in Volume: 56.9% Epithelialization: Small (1-33%) Tunneling: No Undermining: No Wound Description Classification: Full Thickness Without Exposed Support Str Wound Margin: Flat and Intact Exudate Amount: Medium Exudate Type: Serosanguineous Exudate Color: red, brown uctures Wound Bed Granulation Amount: Small (1-33%) Exposed Structure Granulation Quality: Pink Fascia Exposed: No Necrotic Amount: Large (67-100%) Fat Layer (Subcutaneous Tissue) Exposed: Yes Necrotic Quality: Adherent Slough Tendon Exposed: No Muscle Exposed: No Joint Exposed: No Bone Exposed: No Treatment Notes Wound #3 (Lower Leg) Wound Laterality: Right, Anterior Cleanser Soap and Water Discharge Instruction: May shower and wash wound with dial antibacterial soap and water prior to dressing change. Wound Cleanser Discharge Instruction: Cleanse the wound with wound  cleanser prior to applying a clean dressing using gauze sponges, not tissue or cotton balls. Peri-Wound Care Triamcinolone 15 (g) Discharge Instruction: Use triamcinolone 15 (g) as directed Sween Lotion (Moisturizing lotion) Discharge Instruction: Apply moisturizing lotion as directed Topical Primary Dressing Maxorb Extra Calcium Alginate 2x2 in Discharge Instruction: Apply calcium alginate to wound bed as instructed Cutimed Sorbact Swab Discharge Instruction: Apply to wound bed as instructed Secondary Dressing ABD Pad, 8x10 Discharge Instruction: Apply over primary dressing as directed. Secured With Compression Wrap ThreePress (3 layer compression wrap) Discharge Instruction: Apply three layer compression as directed. Compression Stockings Add-Ons Electronic Signature(s) Signed: 10/12/2021 4:29:15 PM By: Baruch Gouty RN, BSN Previous Signature: 10/12/2021 8:34:42 AM Version By: Sandre Kitty Entered By: Baruch Gouty on 10/12/2021 08:43:08 -------------------------------------------------------------------------------- Vitals Details Patient Name: Date of Service: [name] [name] 10/12/2021 8:15 A M Medical Record Number: [id number] Patient Account Number: [id number] Date of Birth/Sex: Treating RN: [birth year] (60 y.o. M) Primary Care Cornell Gaber: Fonnie Jarvis Other Clinician: Referring Tiffiany Beadles: Treating Kacelyn Rowzee/Extender: Dorann Ou in Treatment: 2 Vital Signs Time Taken: 08:15 Temperature (F): 98.4 Height (in): 71 Pulse (bpm): 69 Weight (lbs): 216 Respiratory Rate (breaths/min): 20 Body Mass Index (BMI): 30.1 Blood Pressure (mmHg): 153/82 Reference Range: 80 - 120 mg / dl Electronic Signature(s) Signed: 10/12/2021 8:34:42 AM By: Sandre Kitty Entered By: Sandre Kitty on 10/12/2021 08:15:33

## 2021-10-12 NOTE — Progress Notes (Signed)
JETSON, PICKREL ([id number]) Visit Report for 10/12/2021 Chief Complaint Document Details Patient Name: Date of Service: [name] [name] 10/12/2021 8:15 A M Medical Record Number: [id number] Patient Account Number: [id number] Date of Birth/Sex: Treating RN: 05-25-1961 (60 y.o. M) Primary Care Provider: Fonnie Jarvis Other Clinician: Referring Provider: Treating Provider/Extender: Dorann Ou in Treatment: 2 Information Obtained from: Patient Chief Complaint Bilateral lower extremity wounds Electronic Signature(s) Signed: 10/12/2021 9:56:32 AM By: Kalman Shan DO Entered By: Kalman Shan on 10/12/2021 09:15:45 -------------------------------------------------------------------------------- Debridement Details Patient Name: Date of Service: [name] [name] 10/12/2021 8:15 A M Medical Record Number: [id number] Patient Account Number: [id number] Date of Birth/Sex: Treating RN: Feb 11, 1961 (60 y.o. Ernestene Mention Primary Care Provider: Fonnie Jarvis Other Clinician: Referring Provider: Treating Provider/Extender: Dorann Ou in Treatment: 2 Debridement Performed for Assessment: Wound #1 Left,Anterior Lower Leg Performed By: Physician Kalman Shan, DO Debridement Type: Debridement Severity of Tissue Pre Debridement: Fat layer exposed Level of Consciousness (Pre-procedure): Awake and Alert Pre-procedure Verification/Time Out Yes - 09:00 Taken: Start Time: 09:01 Pain Control: Other : benzocaine 20% spray T Area Debrided (L x W): otal 2 (cm) x 2 (cm) = 4 (cm) Tissue and other material debrided: Non-Viable, Slough, Slough Level: Non-Viable Tissue Debridement Description: Selective/Open Wound Instrument: Curette Bleeding: Minimum Hemostasis Achieved: Pressure Procedural Pain: 4 Post Procedural Pain: 3 Response to Treatment: Procedure was tolerated well Level of Consciousness (Post- Awake and  Alert procedure): Post Debridement Measurements of Total Wound Length: (cm) 3.5 Width: (cm) 4.5 Depth: (cm) 0.1 Volume: (cm) 1.237 Character of Wound/Ulcer Post Debridement: Improved Severity of Tissue Post Debridement: Fat layer exposed Post Procedure Diagnosis Same as Pre-procedure Electronic Signature(s) Signed: 10/12/2021 9:56:32 AM By: Kalman Shan DO Signed: 10/12/2021 4:29:15 PM By: Baruch Gouty RN, BSN Entered By: Baruch Gouty on 10/12/2021 09:13:46 -------------------------------------------------------------------------------- Debridement Details Patient Name: Date of Service: [name] [name] 10/12/2021 8:15 A M Medical Record Number: [id number] Patient Account Number: [id number] Date of Birth/Sex: Treating RN: [birth year] (60 y.o. Ernestene Mention Primary Care Provider: Fonnie Jarvis Other Clinician: Referring Provider: Treating Provider/Extender: Dorann Ou in Treatment: 2 Debridement Performed for Assessment: Wound #2 Left,Medial Lower Leg Performed By: Physician Kalman Shan, DO Debridement Type: Debridement Severity of Tissue Pre Debridement: Fat layer exposed Level of Consciousness (Pre-procedure): Awake and Alert Pre-procedure Verification/Time Out Yes - 09:00 Taken: Start Time: 09:01 Pain Control: Other : benzocaine 20% spray T Area Debrided (L x W): otal 1.3 (cm) x 1 (cm) = 1.3 (cm) Tissue and other material debrided: Non-Viable, Slough, Slough Level: Non-Viable Tissue Debridement Description: Selective/Open Wound Instrument: Curette Bleeding: Minimum Hemostasis Achieved: Pressure Procedural Pain: 4 Post Procedural Pain: 3 Response to Treatment: Procedure was tolerated well Level of Consciousness (Post- Awake and Alert procedure): Post Debridement Measurements of Total Wound Length: (cm) 1.3 Width: (cm) 1 Depth: (cm) 0.1 Volume: (cm) 0.102 Character of Wound/Ulcer Post Debridement:  Improved Severity of Tissue Post Debridement: Fat layer exposed Post Procedure Diagnosis Same as Pre-procedure Electronic Signature(s) Signed: 10/12/2021 9:56:32 AM By: Kalman Shan DO Signed: 10/12/2021 4:29:15 PM By: Baruch Gouty RN, BSN Entered By: Baruch Gouty on 10/12/2021 09:14:23 -------------------------------------------------------------------------------- Debridement Details Patient Name: Date of Service: [name] [name] 10/12/2021 8:15 A M Medical Record Number: [id number] Patient Account Number: [id number] Date of Birth/Sex: Treating RN: 10/28/1961 (60 y.o. Ernestene Mention Primary Care Provider: Fonnie Jarvis Other Clinician: Referring Provider: Treating Provider/Extender: Dorann Ou in Treatment: 2 Debridement Performed for  Assessment: Wound #3 Right,Anterior Lower Leg Performed By: Physician Kalman Shan, DO Debridement Type: Debridement Severity of Tissue Pre Debridement: Fat layer exposed Level of Consciousness (Pre-procedure): Awake and Alert Pre-procedure Verification/Time Out Yes - 09:00 Taken: Start Time: 09:01 Pain Control: Other : benzocaine 20% spray T Area Debrided (L x W): otal 0.7 (cm) x 0.4 (cm) = 0.28 (cm) Tissue and other material debrided: Viable, Non-Viable, Slough, Subcutaneous, Slough Level: Skin/Subcutaneous Tissue Debridement Description: Excisional Instrument: Curette Bleeding: Minimum Hemostasis Achieved: Pressure Procedural Pain: 4 Post Procedural Pain: 3 Response to Treatment: Procedure was tolerated well Level of Consciousness (Post- Awake and Alert procedure): Post Debridement Measurements of Total Wound Length: (cm) 0.7 Width: (cm) 0.4 Depth: (cm) 0.1 Volume: (cm) 0.022 Character of Wound/Ulcer Post Debridement: Improved Severity of Tissue Post Debridement: Fat layer exposed Post Procedure Diagnosis Same as Pre-procedure Electronic Signature(s) Signed: 10/12/2021 9:56:32 AM  By: Kalman Shan DO Signed: 10/12/2021 4:29:15 PM By: Baruch Gouty RN, BSN Entered By: Baruch Gouty on 10/12/2021 09:14:51 -------------------------------------------------------------------------------- HPI Details Patient Name: Date of Service: [name] [name] 10/12/2021 8:15 A M Medical Record Number: [id number] Patient Account Number: [id number] Date of Birth/Sex: Treating RN: 1961-03-06 (60 y.o. M) Primary Care Provider: Fonnie Jarvis Other Clinician: Referring Provider: Treating Provider/Extender: Dorann Ou in Treatment: 2 History of Present Illness HPI Description: Admission 09/28/2021 Mr. 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 He states the wounds have been present for the past 6 months and wax and wane in healing. He has been seen by vein and vascular for this issue. He has been treated with Unna boots with benefit. He states he started compression stockings when there was 1 wound remaining and now has developed multiple wounds. He currently takes 20 mg of Lasix and has been doing so for years. He states he has been on several rounds of antibiotics for this issue. He last took Bactrim and completed this a couple days ago. He currently denies signs of infection. 11/29; patient presents for 1 week follow-up. He states that 4 days ago he felt burning to his legs bilaterally and removed the compression wraps. He saw vein and vascular yesterday and was placed in an Unna boot to the left leg. T oday He reports increased redness to the left leg. 12/6; patient presents for follow-up. He has no issues or complaints today. He completed his antibiotics and reports improvement in symptoms. At the last nurse visit since he had done well with Kerlix and Coban he was  increased to 3 layer compression wrap. Over the past few days he has tolerated this well. He currently denies signs of infection. Electronic Signature(s) Signed: 10/12/2021 9:56:32 AM By: Kalman Shan DO Entered By: Kalman Shan on 10/12/2021 09:17:40 -------------------------------------------------------------------------------- Physical Exam Details Patient Name: Date of Service: [name] [name] 10/12/2021 8:15 A M Medical Record Number: [id number] Patient Account Number: [id number] Date of Birth/Sex: Treating RN: [birth year] (60 y.o. M) Primary Care Provider: Fonnie Jarvis Other Clinician: Referring Provider: Treating Provider/Extender: Dorann Ou in Treatment: 2 Constitutional respirations regular, non-labored and within target range for patient.. Cardiovascular 2+ dorsalis pedis/posterior tibialis pulses. Psychiatric pleasant and cooperative. Notes Bilateral lower extremity wounds limited to skin breakdown with nonviable tissue in the wound bed. 2+ pitting edema to the knees bilaterally. Varicose veins noted. Venous stasis dermatitis bilaterally.  Electronic Signature(s) Signed: 10/12/2021 9:56:32 AM By: Kalman Shan DO Entered By: Kalman Shan on 10/12/2021 09:18:17 -------------------------------------------------------------------------------- Physician Orders Details Patient Name: Date of Service: [name] [name] 10/12/2021 8:15 A M Medical Record Number: [id number] Patient Account Number: [id number] Date of Birth/Sex: Treating RN: 05-Mar-1961 (60 y.o. Ernestene Mention Primary Care Provider: Fonnie Jarvis Other Clinician: Referring Provider: Treating Provider/Extender: Dorann Ou in Treatment: 2 Verbal / Phone Orders: No Diagnosis Coding ICD-10 Coding Code Description [phone] Chronic venous hypertension (idiopathic) with ulcer of bilateral lower extremity L97.812 Non-pressure chronic ulcer  of other part of right lower leg with fat layer exposed L97.822 Non-pressure chronic ulcer of other part of left lower leg with fat layer exposed F17.210 Nicotine dependence, cigarettes, uncomplicated J88 Essential (primary) hypertension J44.9 Chronic obstructive pulmonary disease, unspecified Follow-up Appointments ppointment in 1 week. - Dr. Heber [place] Tuesday 10/12/2021 Return A Patient to follow up with primary care provider related to diuretics' pill and fluid retention. Bathing/ Shower/ Hygiene May shower with protection but do not get wound dressing(s) wet. Edema Control - Lymphedema / SCD / Other Elevate legs to the level of the heart or above for 30 minutes daily and/or when sitting, a frequency of: - 3-4 times a [place] throughout the [place]. Avoid standing for long periods of time. Exercise regularly Additional Orders / Instructions Stop/Decrease Smoking Wound Treatment Wound #1 - Lower Leg Wound Laterality: Left, Anterior Cleanser: Soap and Water 1 x Per Week Discharge Instructions: May shower and wash wound with dial antibacterial soap and water prior to dressing change. Cleanser: Wound Cleanser 1 x Per Week Discharge Instructions: Cleanse the wound with wound cleanser prior to applying a clean dressing using gauze sponges, not tissue or cotton balls. Peri-Wound Care: Triamcinolone 15 (g) 1 x Per Week Discharge Instructions: Use triamcinolone 15 (g) as directed Peri-Wound Care: Zinc Oxide Ointment 30g tube 1 x Per Week Discharge Instructions: Apply Zinc Oxide to periwound with each dressing change Peri-Wound Care: Sween Lotion (Moisturizing lotion) 1 x Per Week Discharge Instructions: Apply moisturizing lotion as directed Prim Dressing: Maxorb Extra Calcium Alginate 2x2 in 1 x Per Week ary Discharge Instructions: Apply calcium alginate to wound bed as instructed Prim Dressing: Cutimed Sorbact Swab 1 x Per Week ary Discharge Instructions: Apply to wound bed as instructed Secondary  Dressing: ABD Pad, 8x10 1 x Per Week Discharge Instructions: Apply over primary dressing as directed. Compression Wrap: ThreePress (3 layer compression wrap) 1 x Per Week Discharge Instructions: Apply three layer compression as directed. Wound #2 - Lower Leg Wound Laterality: Left, Medial Cleanser: Soap and Water 1 x Per Week Discharge Instructions: May shower and wash wound with dial antibacterial soap and water prior to dressing change. Cleanser: Wound Cleanser 1 x Per Week Discharge Instructions: Cleanse the wound with wound cleanser prior to applying a clean dressing using gauze sponges, not tissue or cotton balls. Peri-Wound Care: Triamcinolone 15 (g) 1 x Per Week Discharge Instructions: Use triamcinolone 15 (g) as directed Peri-Wound Care: Zinc Oxide Ointment 30g tube 1 x Per Week Discharge Instructions: Apply Zinc Oxide to periwound with each dressing change Peri-Wound Care: Sween Lotion (Moisturizing lotion) 1 x Per Week Discharge Instructions: Apply moisturizing lotion as directed Prim Dressing: Maxorb Extra Calcium Alginate 2x2 in 1 x Per Week ary Discharge Instructions: Apply calcium alginate to wound bed as instructed Prim Dressing: Cutimed Sorbact Swab 1 x Per Week ary Discharge Instructions: Apply to wound bed as instructed Secondary Dressing: ABD Pad, 8x10 1 x Per  Week Discharge Instructions: Apply over primary dressing as directed. Compression Wrap: ThreePress (3 layer compression wrap) 1 x Per Week Discharge Instructions: Apply three layer compression as directed. Wound #3 - Lower Leg Wound Laterality: Right, Anterior Cleanser: Soap and Water 1 x Per Week Discharge Instructions: May shower and wash wound with dial antibacterial soap and water prior to dressing change. Cleanser: Wound Cleanser 1 x Per Week Discharge Instructions: Cleanse the wound with wound cleanser prior to applying a clean dressing using gauze sponges, not tissue or cotton balls. Peri-Wound Care:  Triamcinolone 15 (g) 1 x Per Week Discharge Instructions: Use triamcinolone 15 (g) as directed Peri-Wound Care: Zinc Oxide Ointment 30g tube 1 x Per Week Discharge Instructions: Apply Zinc Oxide to periwound with each dressing change Peri-Wound Care: Sween Lotion (Moisturizing lotion) 1 x Per Week Discharge Instructions: Apply moisturizing lotion as directed Prim Dressing: Maxorb Extra Calcium Alginate 2x2 in 1 x Per Week ary Discharge Instructions: Apply calcium alginate to wound bed as instructed Prim Dressing: Cutimed Sorbact Swab 1 x Per Week ary Discharge Instructions: Apply to wound bed as instructed Secondary Dressing: ABD Pad, 8x10 1 x Per Week Discharge Instructions: Apply over primary dressing as directed. Compression Wrap: ThreePress (3 layer compression wrap) 1 x Per Week Discharge Instructions: Apply three layer compression as directed. Electronic Signature(s) Signed: 10/12/2021 9:56:32 AM By: Kalman Shan DO Signed: 10/12/2021 4:29:15 PM By: Baruch Gouty RN, BSN Entered By: Baruch Gouty on 10/12/2021 09:19:25 -------------------------------------------------------------------------------- Problem List Details Patient Name: Date of Service: [name] [name] 10/12/2021 8:15 A M Medical Record Number: [id number] Patient Account Number: [id number] Date of Birth/Sex: Treating RN: [birth year] (60 y.o. M) Primary Care Provider: Fonnie Jarvis Other Clinician: Referring Provider: Treating Provider/Extender: Dorann Ou in Treatment: 2 Active Problems ICD-10 Encounter Code Description Active Date MDM Diagnosis I87.313 Chronic venous hypertension (idiopathic) with ulcer of bilateral lower extremity 09/28/2021 No Yes L97.812 Non-pressure chronic ulcer of other part of right lower leg with fat layer 09/28/2021 No Yes exposed L97.822 Non-pressure chronic ulcer of other part of left lower leg with fat layer exposed11/22/2022 No Yes F17.210  Nicotine dependence, cigarettes, uncomplicated 37/85/8850 No Yes I10 Essential (primary) hypertension 09/28/2021 No Yes J44.9 Chronic obstructive pulmonary disease, unspecified 09/28/2021 No Yes Inactive Problems Resolved Problems Electronic Signature(s) Signed: 10/12/2021 9:56:32 AM By: Kalman Shan DO Entered By: Kalman Shan on 10/12/2021 09:14:48 -------------------------------------------------------------------------------- Progress Note Details Patient Name: Date of Service: DA Y, DA [name] 10/12/2021 8:15 A M Medical Record Number: [id number] Patient Account Number: [id number] Date of Birth/Sex: Treating RN: [birth year] (60 y.o. M) Primary Care Provider: Fonnie Jarvis Other Clinician: Referring Provider: Treating Provider/Extender: Dorann Ou in Treatment: 2 Subjective Chief Complaint Information obtained from Patient Bilateral lower extremity wounds History of Present Illness (HPI) Admission 09/28/2021 Mr. 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 He states the wounds have been present for the past 6 months and wax and wane in healing. He has been seen by vein and vascular for this issue. He has been treated with Unna boots with benefit. He states he started compression stockings when there was 1 wound remaining and now has developed multiple wounds. He currently takes 20 mg of Lasix and has been doing so for years. He states he has been on several rounds of antibiotics for this issue. He  last took Bactrim and completed this a couple days ago. He currently denies signs of infection. 11/29; patient presents for 1 week follow-up. He states that 4 days ago he felt burning to his legs bilaterally and removed the compression wraps. He saw vein and vascular  yesterday and was placed in an Unna boot to the left leg. T oday He reports increased redness to the left leg. 12/6; patient presents for follow-up. He has no issues or complaints today. He completed his antibiotics and reports improvement in symptoms. At the last nurse visit since he had done well with Kerlix and Coban he was increased to 3 layer compression wrap. Over the past few days he has tolerated this well. He currently denies signs of infection. Patient History Information obtained from Patient, Chart. Family History Cancer - Mother, Diabetes - Father,Siblings, Heart Disease - Mother, Hypertension - Mother, No family history of Kidney Disease, Lung Disease, Seizures, Stroke, Thyroid Problems, Tuberculosis. Social History Current every Esses smoker - 1 1/2 ppd, Marital Status - Divorced, Alcohol Use - Never, Drug Use - No History, Caffeine Use - Daily - coffee. Medical History Eyes Denies history of Cataracts, Glaucoma, Optic Neuritis Ear/Nose/Mouth/Throat Denies history of Chronic sinus problems/congestion, Middle ear problems Hematologic/Lymphatic Denies history of Anemia, Hemophilia, Human Immunodeficiency Virus, Lymphedema, Sickle Cell Disease Respiratory Patient has history of Chronic Obstructive Pulmonary Disease (COPD) Denies history of Aspiration, Asthma, Pneumothorax, Sleep Apnea, Tuberculosis Cardiovascular Patient has history of Deep Vein Thrombosis, Hypertension, Peripheral Venous Disease Denies history of Angina, Arrhythmia, Congestive Heart Failure, Coronary Artery Disease, Hypotension, Myocardial Infarction, Peripheral Arterial Disease, Phlebitis, Vasculitis Gastrointestinal Denies history of Cirrhosis , Colitis, Crohnoos, Hepatitis A, Hepatitis B, Hepatitis C Endocrine Denies history of Type I Diabetes, Type II Diabetes Genitourinary Denies history of End Stage Renal Disease Immunological Denies history of Lupus Erythematosus, Raynaudoos,  Scleroderma Integumentary (Skin) Denies history of History of Burn Musculoskeletal Denies history of Gout, Rheumatoid Arthritis, Osteoarthritis, Osteomyelitis Neurologic Patient has history of Neuropathy - left sciatic nerve pain Denies history of Dementia, Quadriplegia, Paraplegia, Seizure Disorder Oncologic Denies history of Received Chemotherapy, Received Radiation Psychiatric Denies history of Anorexia/bulimia, Confinement Anxiety Hospitalization/Surgery History - Spinal Surgery over 20 years ago. Medical A Surgical History Notes nd Constitutional Symptoms (General Health) Stroke 2016 Migraine Cardiovascular PE Psychiatric Anxiety Objective Constitutional respirations regular, non-labored and within target range for patient.. Vitals Time Taken: 8:15 AM, Height: 71 in, Weight: 216 lbs, BMI: 30.1, Temperature: 98.4 F, Pulse: 69 bpm, Respiratory Rate: 20 breaths/min, Blood Pressure: 153/82 mmHg. Cardiovascular 2+ dorsalis pedis/posterior tibialis pulses. Psychiatric pleasant and cooperative. General Notes: Bilateral lower extremity wounds limited to skin breakdown with nonviable tissue in the wound bed. 2+ pitting edema to the knees bilaterally. Varicose veins noted. Venous stasis dermatitis bilaterally. Integumentary (Hair, Skin) Wound #1 status is Open. Original cause of wound was Gradually Appeared. The date acquired was: 09/14/2021. The wound has been in treatment 2 weeks. The wound is located on the Left,Anterior Lower Leg. The wound measures 3.5cm length x 4.5cm width x 0.1cm depth; 12.37cm^2 area and 1.237cm^3 volume. There is Fat Layer (Subcutaneous Tissue) exposed. There is no tunneling or undermining noted. There is a medium amount of serosanguineous drainage noted. The wound margin is flat and intact. There is small (1-33%) pink granulation within the wound bed. There is a large (67-100%) amount of necrotic tissue within the wound bed including Adherent  Slough. Wound #2 status is Open. Original cause of wound was Gradually Appeared. The date acquired was: 09/14/2021. The  wound has been in treatment 2 weeks. The wound is located on the Left,Medial Lower Leg. The wound measures 1.3cm length x 1cm width x 0.1cm depth; 1.021cm^2 area and 0.102cm^3 volume. There is Fat Layer (Subcutaneous Tissue) exposed. There is no tunneling or undermining noted. There is a medium amount of serosanguineous drainage noted. The wound margin is flat and intact. There is small (1-33%) pink granulation within the wound bed. There is a large (67-100%) amount of necrotic tissue within the wound bed including Adherent Slough. Wound #3 status is Open. Original cause of wound was Gradually Appeared. The date acquired was: 09/14/2021. The wound has been in treatment 2 weeks. The wound is located on the Right,Anterior Lower Leg. The wound measures 0.7cm length x 0.4cm width x 0.1cm depth; 0.22cm^2 area and 0.022cm^3 volume. There is Fat Layer (Subcutaneous Tissue) exposed. There is no tunneling or undermining noted. There is a medium amount of serosanguineous drainage noted. The wound margin is flat and intact. There is small (1-33%) pink granulation within the wound bed. There is a large (67-100%) amount of necrotic tissue within the wound bed including Adherent Slough. Assessment Active Problems ICD-10 Chronic venous hypertension (idiopathic) with ulcer of bilateral lower extremity Non-pressure chronic ulcer of other part of right lower leg with fat layer exposed Non-pressure chronic ulcer of other part of left lower leg with fat layer exposed Nicotine dependence, cigarettes, uncomplicated Essential (primary) hypertension Chronic obstructive pulmonary disease, unspecified Patient's wounds have shown improvement in size appearance since last clinic visit. I debrided nonviable tissue. No signs of infection on exam. I recommended continuing with calcium alginate and sorbact  underneath 3 layer compression. He knows to call with any questions or concerns. Follow-up in 1 week Procedures Wound #1 Pre-procedure diagnosis of Wound #1 is a Venous Leg Ulcer located on the Left,Anterior Lower Leg .Severity of Tissue Pre Debridement is: Fat layer exposed. There was a Selective/Open Wound Non-Viable Tissue Debridement with a total area of 4 sq cm performed by Kalman Shan, DO. With the following instrument(s): Curette to remove Non-Viable tissue/material. Material removed includes [facility] after achieving pain control using Other (benzocaine 20% spray). No specimens were taken. A time out was conducted at 09:00, prior to the start of the procedure. A Minimum amount of bleeding was controlled with Pressure. The procedure was tolerated well with a pain level of 4 throughout and a pain level of 3 following the procedure. Post Debridement Measurements: 3.5cm length x 4.5cm width x 0.1cm depth; 1.237cm^3 volume. Character of Wound/Ulcer Post Debridement is improved. Severity of Tissue Post Debridement is: Fat layer exposed. Post procedure Diagnosis Wound #1: Same as Pre-Procedure Pre-procedure diagnosis of Wound #1 is a Venous Leg Ulcer located on the Left,Anterior Lower Leg . There was a Three Layer Compression Therapy Procedure by Baruch Gouty, RN. Post procedure Diagnosis Wound #1: Same as Pre-Procedure Wound #2 Pre-procedure diagnosis of Wound #2 is a Venous Leg Ulcer located on the Left,Medial Lower Leg .Severity of Tissue Pre Debridement is: Fat layer exposed. There was a Selective/Open Wound Non-Viable Tissue Debridement with a total area of 1.3 sq cm performed by Kalman Shan, DO. With the following instrument(s): Curette to remove Non-Viable tissue/material. Material removed includes [facility] after achieving pain control using Other (benzocaine 20% spray). No specimens were taken. A time out was conducted at 09:00, prior to the start of the procedure. A Minimum  amount of bleeding was controlled with Pressure. The procedure was tolerated well with a pain level of 4 throughout and  a pain level of 3 following the procedure. Post Debridement Measurements: 1.3cm length x 1cm width x 0.1cm depth; 0.102cm^3 volume. Character of Wound/Ulcer Post Debridement is improved. Severity of Tissue Post Debridement is: Fat layer exposed. Post procedure Diagnosis Wound #2: Same as Pre-Procedure Wound #3 Pre-procedure diagnosis of Wound #3 is a Venous Leg Ulcer located on the Right,Anterior Lower Leg .Severity of Tissue Pre Debridement is: Fat layer exposed. There was a Excisional Skin/Subcutaneous Tissue Debridement with a total area of 0.28 sq cm performed by Kalman Shan, DO. With the following instrument(s): Curette to remove Viable and Non-Viable tissue/material. Material removed includes Subcutaneous Tissue and Slough and after achieving pain control using Other (benzocaine 20% spray). No specimens were taken. A time out was conducted at 09:00, prior to the start of the procedure. A Minimum amount of bleeding was controlled with Pressure. The procedure was tolerated well with a pain level of 4 throughout and a pain level of 3 following the procedure. Post Debridement Measurements: 0.7cm length x 0.4cm width x 0.1cm depth; 0.022cm^3 volume. Character of Wound/Ulcer Post Debridement is improved. Severity of Tissue Post Debridement is: Fat layer exposed. Post procedure Diagnosis Wound #3: Same as Pre-Procedure Pre-procedure diagnosis of Wound #3 is a Venous Leg Ulcer located on the Right,Anterior Lower Leg . There was a Three Layer Compression Therapy Procedure by Baruch Gouty, RN. Post procedure Diagnosis Wound #3: Same as Pre-Procedure Plan 1. In office sharp debridement 2. Calcium alginate and sorbact under 3 layer compression 3. Follow-up in 1 week Electronic Signature(s) Signed: 10/12/2021 9:56:32 AM By: Kalman Shan DO Signed: 10/12/2021 4:29:15 PM By:  Baruch Gouty RN, BSN Entered By: Baruch Gouty on 10/12/2021 09:38:46 -------------------------------------------------------------------------------- HxROS Details Patient Name: Date of Service: [name] [name] 10/12/2021 8:15 A M Medical Record Number: [id number] Patient Account Number: [id number] Date of Birth/Sex: Treating RN: 1961-03-04 (60 y.o. M) Primary Care Provider: Fonnie Jarvis Other Clinician: Referring Provider: Treating Provider/Extender: Dorann Ou in Treatment: 2 Information Obtained From Patient Chart Constitutional Symptoms (General Health) Medical History: Past Medical History Notes: Stroke 2016 Migraine Eyes Medical History: Negative for: Cataracts; Glaucoma; Optic Neuritis Ear/Nose/Mouth/Throat Medical History: Negative for: Chronic sinus problems/congestion; Middle ear problems Hematologic/Lymphatic Medical History: Negative for: Anemia; Hemophilia; Human Immunodeficiency Virus; Lymphedema; Sickle Cell Disease Respiratory Medical History: Positive for: Chronic Obstructive Pulmonary Disease (COPD) Negative for: Aspiration; Asthma; Pneumothorax; Sleep Apnea; Tuberculosis Cardiovascular Medical History: Positive for: Deep Vein Thrombosis; Hypertension; Peripheral Venous Disease Negative for: Angina; Arrhythmia; Congestive Heart Failure; Coronary Artery Disease; Hypotension; Myocardial Infarction; Peripheral Arterial Disease; Phlebitis; Vasculitis Past Medical History Notes: PE Gastrointestinal Medical History: Negative for: Cirrhosis ; Colitis; Crohns; Hepatitis A; Hepatitis B; Hepatitis C Endocrine Medical History: Negative for: Type I Diabetes; Type II Diabetes Genitourinary Medical History: Negative for: End Stage Renal Disease Immunological Medical History: Negative for: Lupus Erythematosus; Raynauds; Scleroderma Integumentary (Skin) Medical History: Negative for: History of  Burn Musculoskeletal Medical History: Negative for: Gout; Rheumatoid Arthritis; Osteoarthritis; Osteomyelitis Neurologic Medical History: Positive for: Neuropathy - left sciatic nerve pain Negative for: Dementia; Quadriplegia; Paraplegia; Seizure Disorder Oncologic Medical History: Negative for: Received Chemotherapy; Received Radiation Psychiatric Medical History: Negative for: Anorexia/bulimia; Confinement Anxiety Past Medical History Notes: Anxiety Immunizations Pneumococcal Vaccine: Received Pneumococcal Vaccination: No Implantable Devices No devices added Hospitalization / Surgery History Type of Hospitalization/Surgery Spinal Surgery over 20 years ago Family and Social History Cancer: Yes - Mother; Diabetes: Yes - Father,Siblings; Heart Disease: Yes - Mother; Hypertension: Yes - Mother; Kidney Disease: No; Lung  Disease: No; Seizures: No; Stroke: No; Thyroid Problems: No; Tuberculosis: No; Current every Steen smoker - 1 1/2 ppd; Marital Status - Divorced; Alcohol Use: Never; Drug Use: No History; Caffeine Use: Daily - coffee; Financial Concerns: No; Food, Clothing or Shelter Needs: No; Support System Lacking: No; Transportation Concerns: No Electronic Signature(s) Signed: 10/12/2021 9:56:32 AM By: Kalman Shan DO Entered By: Kalman Shan on 10/12/2021 09:17:47 -------------------------------------------------------------------------------- SuperBill Details Patient Name: Date of Service: [name] [name] 10/12/2021 Medical Record Number: [id number] Patient Account Number: [id number] Date of Birth/Sex: Treating RN: [birth year] (60 y.o. M) Primary Care Provider: Fonnie Jarvis Other Clinician: Referring Provider: Treating Provider/Extender: Dorann Ou in Treatment: 2 Diagnosis Coding ICD-10 Codes Code Description [phone] Chronic venous hypertension (idiopathic) with ulcer of bilateral lower extremity L97.812 Non-pressure chronic  ulcer of other part of right lower leg with fat layer exposed L97.822 Non-pressure chronic ulcer of other part of left lower leg with fat layer exposed F17.210 Nicotine dependence, cigarettes, uncomplicated H82 Essential (primary) hypertension J44.9 Chronic obstructive pulmonary disease, unspecified Facility Procedures CPT4 Code: [id number] Description: 78938 - DEB SUBQ TISSUE 20 SQ CM/< ICD-10 Diagnosis Description B01.751 Non-pressure chronic ulcer of other part of right lower leg with fat layer expos Modifier: ed Quantity: 1 CPT4 Code: [id number] Description: 82423 - DEBRIDE WOUND 1ST 20 SQ CM OR < ICD-10 Diagnosis Description L97.822 Non-pressure chronic ulcer of other part of left lower leg with fat layer expose Modifier: d Quantity: 1 Physician Procedures : CPT4 Code Description Modifier [id number] 15400 - WC PHYS SUBQ TISS 20 SQ CM ICD-10 Diagnosis Description Q67.619 Non-pressure chronic ulcer of other part of right lower leg with fat layer exposed Quantity: 1 : [id number] 12458 - WC PHYS DEBR WO ANESTH 20 SQ CM ICD-10 Diagnosis Description L97.822 Non-pressure chronic ulcer of other part of left lower leg with fat layer exposed Quantity: 1 Electronic Signature(s) Signed: 10/12/2021 9:56:32 AM By: Kalman Shan DO Entered By: Kalman Shan on 10/12/2021 09:19:53

## 2021-10-13 NOTE — Progress Notes (Signed)
KAINOAH, BARTOSIEWICZ ([id number]) Visit Report for 10/08/2021 SuperBill Details Patient Name: Date of Service: [name] [name] 10/08/2021 Medical Record Number: [id number] Patient Account Number: [id number] Date of Birth/Sex: Treating RN: [birth year] (60 y.o. [name] [name], [name] [name] Primary Care Provider: Fonnie Jarvis Other Clinician: Referring Provider: Treating Provider/Extender: Aloha Gell Weeks in Treatment: 1 Diagnosis Coding ICD-10 Codes Code Description [phone] Chronic venous hypertension (idiopathic) with ulcer of bilateral lower extremity L97.812 Non-pressure chronic ulcer of other part of right lower leg with fat layer exposed L97.822 Non-pressure chronic ulcer of other part of left lower leg with fat layer exposed F17.210 Nicotine dependence, cigarettes, uncomplicated U38 Essential (primary) hypertension J44.9 Chronic obstructive pulmonary disease, unspecified L03.116 Cellulitis of left lower limb Facility Procedures CPT4 Description Modifier Quantity Code [id number] 91660 BILATERAL: Application of multi-layer venous compression system; leg (below knee), including ankle and 1 foot. Electronic Signature(s) Signed: 10/08/2021 10:39:05 AM By: Kalman Shan DO Signed: 10/13/2021 4:34:42 PM By: Rhae Hammock RN Entered By: Rhae Hammock on 10/08/2021 09:37:47

## 2021-10-13 NOTE — Progress Notes (Signed)
GIDEON, BURSTEIN ([id number]) Visit Report for 10/08/2021 Arrival Information Details Patient Name: Date of Service: [name] [name] 10/08/2021 8:45 A M Medical Record Number: [id number] Patient Account Number: [id number] Date of Birth/Sex: Treating RN: 05-Aug-1961 (61 y.o. Ernestene Mention Primary Care Sharalyn Lomba: Fonnie Jarvis Other Clinician: Referring Izetta Sakamoto: Treating Doyce Saling/Extender: Dorann Ou in Treatment: 1 Visit Information History Since Last Visit Added or deleted any medications: No Patient Arrived: [name] Any new allergies or adverse reactions: No Arrival Time: 09:04 Had a fall or experienced change in No Accompanied By: self activities of daily living that may affect Transfer Assistance: None risk of falls: Patient Identification Verified: Yes Signs or symptoms of abuse/neglect since last visito No Secondary Verification Process Completed: Yes Hospitalized since last visit: No Patient Requires Transmission-Based Precautions: No Implantable device outside of the clinic excluding No Patient Has Alerts: Yes cellular tissue based products placed in the center Patient Alerts: Patient on Blood Thinner since last visit: Has Dressing in Place as Prescribed: Yes Pain Present Now: Yes Electronic Signature(s) Signed: 10/12/2021 8:34:42 AM By: Sandre Kitty Entered By: Sandre Kitty on 10/08/2021 09:05:00 -------------------------------------------------------------------------------- Compression Therapy Details Patient Name: Date of Service: [name] [name] 10/08/2021 8:45 A M Medical Record Number: [id number] Patient Account Number: [id number] Date of Birth/Sex: Treating RN: 1961-07-15 (60 y.o. Erie Noe Primary Care Denny Mccree: Fonnie Jarvis Other Clinician: Referring Haylei Cobin: Treating Sally Reimers/Extender: Dorann Ou in Treatment: 1 Compression Therapy Performed for Wound Assessment: Wound #1  Left,Anterior Lower Leg Performed By: Clinician Rhae Hammock, RN Compression Type: Three Layer Electronic Signature(s) Signed: 10/13/2021 4:34:42 PM By: Rhae Hammock RN Entered By: Rhae Hammock on 10/08/2021 09:17:09 -------------------------------------------------------------------------------- Compression Therapy Details Patient Name: Date of Service: [name] [name] 10/08/2021 8:45 A M Medical Record Number: [id number] Patient Account Number: [id number] Date of Birth/Sex: Treating RN: 04-08-1961 (60 y.o. Erie Noe Primary Care Lesslie Mossa: Other Clinician: Fonnie Jarvis Referring Tresean Mattix: Treating Amariyah Bazar/Extender: Dorann Ou in Treatment: 1 Compression Therapy Performed for Wound Assessment: Wound #2 Left,Medial Lower Leg Performed By: Clinician Rhae Hammock, RN Compression Type: Three Layer Electronic Signature(s) Signed: 10/13/2021 4:34:42 PM By: Rhae Hammock RN Entered By: Rhae Hammock on 10/08/2021 09:17:09 -------------------------------------------------------------------------------- Compression Therapy Details Patient Name: Date of Service: [name] [name] 10/08/2021 8:45 A M Medical Record Number: [id number] Patient Account Number: [id number] Date of Birth/Sex: Treating RN: 1961-04-21 (60 y.o. Erie Noe Primary Care Diogo Anne: Fonnie Jarvis Other Clinician: Referring Pebble Botkin: Treating Adarius Tigges/Extender: Dorann Ou in Treatment: 1 Compression Therapy Performed for Wound Assessment: Wound #3 Right,Anterior Lower Leg Performed By: Clinician Rhae Hammock, RN Compression Type: Three Layer Electronic Signature(s) Signed: 10/13/2021 4:34:42 PM By: Rhae Hammock RN Entered By: Rhae Hammock on 10/08/2021 09:17:09 -------------------------------------------------------------------------------- Encounter Discharge Information Details Patient  Name: Date of Service: [name] [name] [name] 10/08/2021 8:45 A M Medical Record Number: [id number] Patient Account Number: [id number] Date of Birth/Sex: Treating RN: 22-Sep-1961 (60 y.o. Burnadette Pop, Lauren Primary Care Diem Dicocco: Fonnie Jarvis Other Clinician: Referring Janeah Kovacich: Treating Keyarra Rendall/Extender: Dorann Ou in Treatment: 1 Encounter Discharge Information Items Discharge Condition: Stable Ambulatory Status: Ambulatory Discharge Destination: Home Transportation: Private Auto Accompanied By: self Schedule Follow-up Appointment: Yes Clinical Summary of Care: Patient Declined Electronic Signature(s) Signed: 10/13/2021 4:34:42 PM By: Rhae Hammock RN Entered By: Rhae Hammock on 10/08/2021 09:37:35 -------------------------------------------------------------------------------- Patient/Caregiver Education Details Patient Name: Date of Service: [name] [name] 12/2/2022andnbsp8:45 A M Medical Record Number: [id number] Patient Account Number:  [id number] Date of Birth/Gender: Treating RN: May 20, 1961 (60 y.o. Erie Noe Primary Care Physician: Fonnie Jarvis Other Clinician: Referring Physician: Treating Physician/Extender: Dorann Ou in Treatment: 1 Education Assessment Education Provided To: Patient Education Topics Provided Nutrition: Methods: Explain/Verbal Responses: Reinforcements needed, State content correctly Pain: Smoking and Wound Healing: Wound/Skin Impairment: Electronic Signature(s) Signed: 10/13/2021 4:34:42 PM By: Rhae Hammock RN Entered By: Rhae Hammock on 10/08/2021 09:37:18 -------------------------------------------------------------------------------- Wound Assessment Details Patient Name: Date of Service: [name] [name] 10/08/2021 8:45 A M Medical Record Number: [id number] Patient Account Number: [id number] Date of Birth/Sex: Treating RN: 07/26/1961 (60 y.o. Ernestene Mention Primary Care Alayzha An: Fonnie Jarvis Other Clinician: Referring Ivey Cina: Treating Mandie Crabbe/Extender: Dorann Ou in Treatment: 1 Wound Status Wound Number: 1 Primary Etiology: Venous Leg Ulcer Wound Location: Left, Anterior Lower Leg Wound Status: Open Wounding Event: Gradually Appeared Date Acquired: 09/14/2021 Weeks Of Treatment: 1 Clustered Wound: No Wound Measurements Length: (cm) 3.8 Width: (cm) 5 Depth: (cm) 0.1 Area: (cm) 14.923 Volume: (cm) 1.492 % Reduction in Area: -132.8% % Reduction in Volume: -132.8% Wound Description Classification: Full Thickness Without Exposed Support Structu Exudate Amount: Large Exudate Type: Serosanguineous Exudate Color: red, brown Electronic Signature(s) Signed: 10/12/2021 8:34:42 AM By: Sandre Kitty Signed: 10/12/2021 4:29:15 PM By: Baruch Gouty RN, BSN Entered By: Michaelle Birks res kins, Destiny on 10/08/2021 09:05:45 -------------------------------------------------------------------------------- Wound Assessment Details Patient Name: Date of Service: [name] [name] 10/08/2021 8:45 A M Medical Record Number: [id number] Patient Account Number: [id number] Date of Birth/Sex: Treating RN: [birth year] (60 y.o. Ernestene Mention Primary Care Cane Dubray: Fonnie Jarvis Other Clinician: Referring Daimion Adamcik: Treating Suellen Durocher/Extender: Dorann Ou in Treatment: 1 Wound Status Wound Number: 2 Primary Etiology: Venous Leg Ulcer Wound Location: Left, Medial Lower Leg Wound Status: Open Wounding Event: Gradually Appeared Date Acquired: 09/14/2021 Weeks Of Treatment: 1 Clustered Wound: No Wound Measurements Length: (cm) 2 Width: (cm) 1.3 Depth: (cm) 0.1 Area: (cm) 2.042 Volume: (cm) 0.204 % Reduction in Area: -294.2% % Reduction in Volume: -292.3% Wound Description Classification: Full Thickness Without Exposed Support Structu Exudate Amount:  Medium Exudate Type: Serosanguineous Exudate Color: red, brown res Electronic Signature(s) Signed: 10/12/2021 8:34:42 AM By: Sandre Kitty Signed: 10/12/2021 4:29:15 PM By: Baruch Gouty RN, BSN Entered By: Sandre Kitty on 10/08/2021 09:05:45 -------------------------------------------------------------------------------- Wound Assessment Details Patient Name: Date of Service: [name] [name] 10/08/2021 8:45 A M Medical Record Number: [id number] Patient Account Number: [id number] Date of Birth/Sex: Treating RN: 03-30-1961 (60 y.o. Ernestene Mention Primary Care Vue Pavon: Fonnie Jarvis Other Clinician: Referring Kamilah Correia: Treating Ivy Puryear/Extender: Dorann Ou in Treatment: 1 Wound Status Wound Number: 3 Primary Etiology: Venous Leg Ulcer Wound Location: Right, Anterior Lower Leg Wound Status: Open Wounding Event: Gradually Appeared Date Acquired: 09/14/2021 Weeks Of Treatment: 1 Clustered Wound: No Wound Measurements Length: (cm) 1.5 Width: (cm) 1.3 Depth: (cm) 0.1 Area: (cm) 1.532 Volume: (cm) 0.153 % Reduction in Area: -199.8% % Reduction in Volume: -200% Wound Description Classification: Full Thickness Without Exposed Support Structu Exudate Amount: Medium Exudate Type: Serosanguineous Exudate Color: red, brown res Electronic Signature(s) Signed: 10/12/2021 8:34:42 AM By: Sandre Kitty Signed: 10/12/2021 4:29:15 PM By: Baruch Gouty RN, BSN Entered By: Sandre Kitty on 10/08/2021 09:05:45 -------------------------------------------------------------------------------- [place] Details Patient Name: Date of Service: [name] [name] [name] 10/08/2021 8:45 A M Medical Record Number: [id number] Patient Account Number: [id number] Date of Birth/Sex: Treating RN: 09-Jun-1961 (60 y.o. Ernestene Mention Primary Care Jie Stickels: Fonnie Jarvis Other Clinician: Referring Alex Mcmanigal:  Treating Mateja Dier/Extender: Dorann Ou in Treatment: 1 Vital Signs Time Taken: 09:04 Temperature (F): 98.5 Height (in): 71 Pulse (bpm): 66 Weight (lbs): 216 Respiratory Rate (breaths/min): 20 Body Mass Index (BMI): 30.1 Blood Pressure (mmHg): 162/91 Reference Range: 80 - 120 mg / dl Electronic Signature(s) Signed: 10/12/2021 8:34:42 AM By: Sandre Kitty Entered By: Sandre Kitty on 10/08/2021 09:05:25

## 2021-10-19 ENCOUNTER — Other Ambulatory Visit: Payer: Self-pay

## 2021-10-19 ENCOUNTER — Encounter (HOSPITAL_BASED_OUTPATIENT_CLINIC_OR_DEPARTMENT_OTHER): Payer: Medicaid Other | Admitting: Internal Medicine

## 2021-10-19 DIAGNOSIS — L97822 Non-pressure chronic ulcer of other part of left lower leg with fat layer exposed: Secondary | ICD-10-CM

## 2021-10-19 DIAGNOSIS — Z7689 Persons encountering health services in other specified circumstances: Secondary | ICD-10-CM | POA: Diagnosis not present

## 2021-10-19 DIAGNOSIS — L97812 Non-pressure chronic ulcer of other part of right lower leg with fat layer exposed: Secondary | ICD-10-CM | POA: Diagnosis not present

## 2021-10-19 NOTE — Progress Notes (Signed)
[name] [name] ([id number]) Visit Report for 10/19/2021 Chief Complaint Document Details Patient Name: Date of Service: [name] [name] 10/19/2021 9:15 A M Medical Record Number: [id number] Patient Account Number: [id number] Date of Birth/Sex: Treating RN: Dec 26, 1960 (60 y.o. [name] Primary Care Provider: Fonnie Jarvis Other Clinician: Referring Provider: Treating Provider/Extender: Dorann Ou in Treatment: 3 Information Obtained from: Patient Chief Complaint Bilateral lower extremity wounds Electronic Signature(s) Signed: 10/19/2021 9:22:59 AM By: Kalman Shan DO Entered By: Kalman Shan on 10/19/2021 09:17:02 -------------------------------------------------------------------------------- Debridement Details Patient Name: Date of Service: [name] [name] 10/19/2021 9:15 A M Medical Record Number: [id number] Patient Account Number: [id number] Date of Birth/Sex: Treating RN: 20-Jan-1961 (60 y.o. [name], [name] Primary Care Provider: Fonnie Jarvis Other Clinician: Referring Provider: Treating Provider/Extender: Dorann Ou in Treatment: 3 Debridement Performed for Assessment: Wound #1 Left,Anterior Lower Leg Performed By: Physician Kalman Shan, DO Debridement Type: Debridement Severity of Tissue Pre Debridement: Fat layer exposed Level of Consciousness (Pre-procedure): Awake and Alert Pre-procedure Verification/Time Out Yes - 09:00 Taken: Start Time: 09:01 Pain Control: Other : benzocaine 20% T Area Debrided (L x W): otal 2.8 (cm) x 2.3 (cm) = 6.44 (cm) Tissue and other material debrided: Viable, Non-Viable, Slough, Subcutaneous, Skin: Dermis , Skin: Epidermis, Fibrin/Exudate, Slough Level: Skin/Subcutaneous Tissue Debridement Description: Excisional Instrument: Curette Bleeding: Minimum Hemostasis Achieved: Pressure End Time: 09:05 Procedural Pain: 0 Post Procedural Pain: 0 Response to  Treatment: Procedure was tolerated well Level of Consciousness (Post- Awake and Alert procedure): Post Debridement Measurements of Total Wound Length: (cm) 2.8 Width: (cm) 2.3 Depth: (cm) 0.1 Volume: (cm) 0.506 Character of Wound/Ulcer Post Debridement: Improved Severity of Tissue Post Debridement: Fat layer exposed Post Procedure Diagnosis Same as Pre-procedure Electronic Signature(s) Signed: 10/19/2021 9:22:59 AM By: Kalman Shan DO Signed: 10/19/2021 5:33:19 PM By: Deon Pilling RN, BSN Entered By: Deon Pilling on 10/19/2021 09:05:24 -------------------------------------------------------------------------------- HPI Details Patient Name: Date of Service: [name] [name] 10/19/2021 9:15 A M Medical Record Number: [id number] Patient Account Number: [id number] Date of Birth/Sex: Treating RN: Mar 25, 1961 (60 y.o. 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 He states the wounds have been present for the past 6 months and wax and wane in healing. He has been seen by vein and vascular for this issue. He has been treated with Unna boots with benefit. He states he started compression stockings when there was 1 wound remaining and now has developed multiple wounds. He currently takes 20 mg of Lasix and has been doing so for years. He states he has been on several rounds of antibiotics for this issue. He last took Bactrim and completed this a couple days ago. He currently denies signs of infection. 11/29; patient presents for 1 week follow-up. He states that 4  days ago he felt burning to his legs bilaterally and removed the compression wraps. He saw vein and vascular yesterday and was placed in an Unna boot to the left leg. T oday He reports increased redness to the left leg. 12/6; patient presents for follow-up. He has no issues or complaints today. He completed his antibiotics and reports improvement in symptoms. At the  last nurse visit since he had done well with Kerlix and Coban he was increased to 3 layer compression wrap. Over the past few days he has tolerated this well. He currently denies signs of infection. 12/13; patient presents for follow-up. He has no issues or complaints today. He has tolerated the 3 layer compression wrap well. He denies signs of infection. Electronic Signature(s) Signed: 10/19/2021 9:22:59 AM By: Kalman Shan DO Entered By: Kalman Shan on 10/19/2021 09:17:42 -------------------------------------------------------------------------------- Physical Exam Details Patient Name: Date of Service: [name] [name] 10/19/2021 9:15 A M Medical Record Number: [id number] Patient Account Number: [id number] Date of Birth/Sex: Treating RN: Feb 09, 1961 (60 y.o. [name] Primary Care Provider: Fonnie Jarvis Other Clinician: Referring Provider: Treating Provider/Extender: Dorann Ou in Treatment: 3 Constitutional respirations regular, non-labored and within target range for patient.. Cardiovascular 2+ dorsalis pedis/posterior tibialis pulses. Psychiatric pleasant and cooperative. Notes Bilateral lower extremity wounds limited to skin breakdown with nonviable tissue in the wound bed. 2+ pitting edema. Varicose veins noted. Venous stasis dermatitis bilaterally. Electronic Signature(s) Signed: 10/19/2021 9:22:59 AM By: Kalman Shan DO Entered By: Kalman Shan on 10/19/2021 09:21:23 -------------------------------------------------------------------------------- Physician  Orders Details Patient Name: Date of Service: [name] [name] 10/19/2021 9:15 A M Medical Record Number: [id number] Patient Account Number: [id number] Date of Birth/Sex: Treating RN: 22-May-1961 (60 y.o. [name], [name] Primary Care Provider: Fonnie Jarvis Other Clinician: Referring Provider: Treating Provider/Extender: Dorann Ou in Treatment: 3 Verbal / Phone Orders: No Diagnosis Coding ICD-10 Coding Code Description [phone] Chronic venous hypertension (idiopathic) with ulcer of bilateral lower extremity L97.812 Non-pressure chronic ulcer of other part of right lower leg with fat layer exposed L97.822 Non-pressure chronic ulcer of other part of left lower leg with fat layer exposed F17.210 Nicotine dependence, cigarettes, uncomplicated I45 Essential (primary) hypertension J44.9 Chronic obstructive pulmonary disease, unspecified Follow-up Appointments ppointment in 1 week. - Dr. Heber [place] Tuesday Return A Bathing/ Shower/ Hygiene May shower with protection but do not get wound dressing(s) wet. Edema Control - Lymphedema / SCD / Other Elevate legs to the level of the heart or above for 30 minutes daily and/or when sitting, a frequency of: - 3-4 times a [place] throughout the [place]. Avoid standing for long periods of time. Exercise regularly Additional Orders / Instructions Stop/Decrease Smoking Wound Treatment Wound #1 - Lower Leg Wound Laterality: Left, Anterior Cleanser: Soap and Water 1 x Per Week Discharge Instructions: May shower and wash wound with dial antibacterial soap and water prior to dressing change. Cleanser: Wound Cleanser 1 x Per Week Discharge Instructions: Cleanse the wound with wound cleanser prior to applying a clean dressing using gauze sponges, not tissue or cotton balls. Peri-Wound Care: Triamcinolone 15 (g) 1 x Per Week Discharge Instructions: Use triamcinolone 15 (g) as directed Peri-Wound Care: Zinc Oxide Ointment 30g tube 1 x  Per Week Discharge Instructions: Apply Zinc Oxide to periwound with each dressing change Peri-Wound Care: Sween Lotion (Moisturizing lotion) 1 x Per Week Discharge Instructions: Apply moisturizing lotion as directed Prim Dressing: Maxorb Extra Calcium Alginate 2x2 in 1 x Per Week ary Discharge Instructions: Apply calcium alginate to wound bed as instructed Prim Dressing: Cutimed Sorbact Swab 1 x Per Week ary Discharge Instructions: Apply to wound bed as instructed Secondary Dressing: ABD Pad, 8x10 1 x Per Week Discharge Instructions: Apply over primary dressing as directed. Compression Wrap: ThreePress (3 layer compression wrap) 1 x Per Week Discharge Instructions: Apply three layer compression as directed. Wound #2 - Lower  Leg Wound Laterality: Left, Medial Cleanser: Soap and Water 1 x Per Week Discharge Instructions: May shower and wash wound with dial antibacterial soap and water prior to dressing change. Cleanser: Wound Cleanser 1 x Per Week Discharge Instructions: Cleanse the wound with wound cleanser prior to applying a clean dressing using gauze sponges, not tissue or cotton balls. Peri-Wound Care: Triamcinolone 15 (g) 1 x Per Week Discharge Instructions: Use triamcinolone 15 (g) as directed Peri-Wound Care: Zinc Oxide Ointment 30g tube 1 x Per Week Discharge Instructions: Apply Zinc Oxide to periwound with each dressing change Peri-Wound Care: Sween Lotion (Moisturizing lotion) 1 x Per Week Discharge Instructions: Apply moisturizing lotion as directed Prim Dressing: Maxorb Extra Calcium Alginate 2x2 in 1 x Per Week ary Discharge Instructions: Apply calcium alginate to wound bed as instructed Prim Dressing: Cutimed Sorbact Swab 1 x Per Week ary Discharge Instructions: Apply to wound bed as instructed Secondary Dressing: ABD Pad, 8x10 1 x Per Week Discharge Instructions: Apply over primary dressing as directed. Compression Wrap: ThreePress (3 layer compression wrap) 1 x Per  Week Discharge Instructions: Apply three layer compression as directed. Wound #3 - Lower Leg Wound Laterality: Right, Anterior Cleanser: Soap and Water 1 x Per Week Discharge Instructions: May shower and wash wound with dial antibacterial soap and water prior to dressing change. Cleanser: Wound Cleanser 1 x Per Week Discharge Instructions: Cleanse the wound with wound cleanser prior to applying a clean dressing using gauze sponges, not tissue or cotton balls. Peri-Wound Care: Triamcinolone 15 (g) 1 x Per Week Discharge Instructions: Use triamcinolone 15 (g) as directed Peri-Wound Care: Zinc Oxide Ointment 30g tube 1 x Per Week Discharge Instructions: Apply Zinc Oxide to periwound with each dressing change Peri-Wound Care: Sween Lotion (Moisturizing lotion) 1 x Per Week Discharge Instructions: Apply moisturizing lotion as directed Prim Dressing: Maxorb Extra Calcium Alginate 2x2 in 1 x Per Week ary Discharge Instructions: Apply calcium alginate to wound bed as instructed Prim Dressing: Cutimed Sorbact Swab 1 x Per Week ary Discharge Instructions: Apply to wound bed as instructed Secondary Dressing: ABD Pad, 8x10 1 x Per Week Discharge Instructions: Apply over primary dressing as directed. Compression Wrap: ThreePress (3 layer compression wrap) 1 x Per Week Discharge Instructions: Apply three layer compression as directed. Electronic Signature(s) Signed: 10/19/2021 2:22:54 PM By: Kalman Shan DO Signed: 10/19/2021 5:33:19 PM By: Deon Pilling RN, BSN Entered By: Deon Pilling on 10/19/2021 13:43:29 -------------------------------------------------------------------------------- Problem List Details Patient Name: Date of Service: [name] [name] 10/19/2021 9:15 A M Medical Record Number: [id number] Patient Account Number: [id number] Date of Birth/Sex: Treating RN: 1961/10/03 (60 y.o. [name], [name] Primary Care Provider: Fonnie Jarvis Other Clinician: Referring  Provider: Treating Provider/Extender: Dorann Ou in Treatment: 3 Active Problems ICD-10 Encounter Code Description Active Date MDM Diagnosis I87.313 Chronic venous hypertension (idiopathic) with ulcer of bilateral lower extremity 09/28/2021 No Yes L97.812 Non-pressure chronic ulcer of other part of right lower leg with fat layer 09/28/2021 No Yes exposed L97.822 Non-pressure chronic ulcer of other part of left lower leg with fat layer exposed11/22/2022 No Yes F17.210 Nicotine dependence, cigarettes, uncomplicated 27/04/2375 No Yes I10 Essential (primary) hypertension 09/28/2021 No Yes J44.9 Chronic obstructive pulmonary disease, unspecified 09/28/2021 No Yes Inactive Problems Resolved Problems Electronic Signature(s) Signed: 10/19/2021 9:22:59 AM By: Kalman Shan DO Entered By: Kalman Shan on 10/19/2021 09:16:26 -------------------------------------------------------------------------------- Progress Note Details Patient Name: Date of Service: [name] [name] 10/19/2021 9:15 A M Medical Record Number: [id number] Patient Account Number: [id number]  Date of Birth/Sex: Treating RN: 1961-01-08 (60 y.o. 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 He states the wounds have been present for the past 6 months and wax and wane in healing. He has been seen by vein and vascular for this issue. He has been treated with Unna boots with benefit.  He states he started compression stockings when there was 1 wound remaining and now has developed multiple wounds. He currently takes 20 mg of Lasix and has been doing so for years. He states he has been on several rounds of antibiotics for this issue. He last took Bactrim and completed this a couple days ago. He currently denies signs of infection. 11/29; patient presents for 1 week follow-up. He states that 4 days ago he felt burning to his legs bilaterally and removed the compression wraps. He saw vein and vascular yesterday and was placed in an Unna boot to the left leg. T oday He reports increased redness to the left leg. 12/6; patient presents for follow-up. He has no issues or complaints today. He completed his antibiotics and reports improvement in symptoms. At the last nurse visit since he had done well with Kerlix and Coban he was increased to 3 layer compression wrap. Over the past few days he has tolerated this well. He currently denies signs of infection. 12/13; patient presents for follow-up. He has no issues or complaints today. He has tolerated the 3 layer compression wrap well. He denies signs of infection. Patient History Information obtained from Patient, Chart. Family History Cancer - Mother, Diabetes - Father,Siblings, Heart Disease - Mother, Hypertension - Mother, No family history of Kidney Disease, Lung Disease, Seizures, Stroke, Thyroid Problems, Tuberculosis. Social History Current every [place] smoker - 1 1/2 ppd, Marital Status - Divorced, Alcohol Use - Never, Drug Use - No History, Caffeine Use - Daily - coffee. Medical History Eyes Denies history of Cataracts, Glaucoma, Optic Neuritis Ear/Nose/Mouth/Throat Denies history of Chronic sinus problems/congestion, Middle ear problems Hematologic/Lymphatic Denies history of Anemia, Hemophilia, Human Immunodeficiency Virus, Lymphedema, Sickle Cell Disease Respiratory Patient has history of Chronic Obstructive Pulmonary  Disease (COPD) Denies history of Aspiration, Asthma, Pneumothorax, Sleep Apnea, Tuberculosis Cardiovascular Patient has history of Deep Vein Thrombosis, Hypertension, Peripheral Venous Disease Denies history of Angina, Arrhythmia, Congestive Heart Failure, Coronary Artery Disease, Hypotension, Myocardial Infarction, Peripheral Arterial Disease, Phlebitis, Vasculitis Gastrointestinal Denies history of Cirrhosis , Colitis, Crohnoos, Hepatitis A, Hepatitis B, Hepatitis C Endocrine Denies history of Type I Diabetes, Type II Diabetes Genitourinary Denies history of End Stage Renal Disease Immunological Denies history of Lupus Erythematosus, Raynaudoos, Scleroderma Integumentary (Skin) Denies history of History of Burn Musculoskeletal Denies history of Gout, Rheumatoid Arthritis, Osteoarthritis, Osteomyelitis Neurologic Patient has history of Neuropathy - left sciatic nerve pain Denies history of Dementia, Quadriplegia, Paraplegia, Seizure Disorder Oncologic Denies history of Received Chemotherapy, Received Radiation Psychiatric Denies history of Anorexia/bulimia, Confinement Anxiety Hospitalization/Surgery History - Spinal Surgery over 20 years ago. Medical A Surgical History Notes nd Constitutional Symptoms (General Health) Stroke 2016 Migraine Cardiovascular PE Psychiatric Anxiety Objective Constitutional respirations regular, non-labored and within target range for patient.. Vitals Time  Taken: 8:30 AM, Height: 71 in, Weight: 216 lbs, BMI: 30.1, Temperature: 97.9 F, Pulse: 60 bpm, Respiratory Rate: 20 breaths/min, Blood Pressure: 156/84 mmHg. Cardiovascular 2+ dorsalis pedis/posterior tibialis pulses. Psychiatric pleasant and cooperative. General Notes: Bilateral lower extremity wounds limited to skin breakdown with nonviable tissue in the wound bed. 2+ pitting edema. Varicose veins noted. Venous stasis dermatitis bilaterally. Integumentary (Hair, Skin) Wound #1 status  is Open. Original cause of wound was Gradually Appeared. The date acquired was: 09/14/2021. The wound has been in treatment 3 weeks. The wound is located on the Left,Anterior Lower Leg. The wound measures 2.8cm length x 2.3cm width x 0.1cm depth; 5.058cm^2 area and 0.506cm^3 volume. There is Fat Layer (Subcutaneous Tissue) exposed. There is no tunneling or undermining noted. There is a medium amount of serosanguineous drainage noted. The wound margin is flat and intact. There is medium (34-66%) pink granulation within the wound bed. There is a medium (34-66%) amount of necrotic tissue within the wound bed including Adherent Slough. Wound #2 status is Open. Original cause of wound was Gradually Appeared. The date acquired was: 09/14/2021. The wound has been in treatment 3 weeks. The wound is located on the Left,Medial Lower Leg. The wound measures 0.5cm length x 0.4cm width x 0.1cm depth; 0.157cm^2 area and 0.016cm^3 volume. There is Fat Layer (Subcutaneous Tissue) exposed. There is no tunneling or undermining noted. There is a medium amount of serosanguineous drainage noted. The wound margin is flat and intact. There is large (67-100%) pink granulation within the wound bed. There is no necrotic tissue within the wound bed. Wound #3 status is Open. Original cause of wound was Gradually Appeared. The date acquired was: 09/14/2021. The wound has been in treatment 3 weeks. The wound is located on the Right,Anterior Lower Leg. The wound measures 0.7cm length x 0.3cm width x 0.1cm depth; 0.165cm^2 area and 0.016cm^3 volume. There is Fat Layer (Subcutaneous Tissue) exposed. There is no tunneling or undermining noted. There is a medium amount of serosanguineous drainage noted. The wound margin is flat and intact. There is large (67-100%) pink granulation within the wound bed. There is a small (1-33%) amount of necrotic tissue within the wound bed including Adherent Slough. Assessment Active  Problems ICD-10 Chronic venous hypertension (idiopathic) with ulcer of bilateral lower extremity Non-pressure chronic ulcer of other part of right lower leg with fat layer exposed Non-pressure chronic ulcer of other part of left lower leg with fat layer exposed Nicotine dependence, cigarettes, uncomplicated Essential (primary) hypertension Chronic obstructive pulmonary disease, unspecified Patient's wounds have shown improvement in size and appearance since last clinic visit. I debrided nonviable tissue. No signs of infection on exam. I recommended continuing with sorbact and calcium alginate under 3 layer compression. Procedures Wound #1 Pre-procedure diagnosis of Wound #1 is a Venous Leg Ulcer located on the Left,Anterior Lower Leg .Severity of Tissue Pre Debridement is: Fat layer exposed. There was a Excisional Skin/Subcutaneous Tissue Debridement with a total area of 6.44 sq cm performed by Kalman Shan, DO. With the following instrument(s): Curette to remove Viable and Non-Viable tissue/material. Material removed includes Subcutaneous Tissue, Slough, Skin: Dermis, Skin: Epidermis, and Fibrin/Exudate after achieving pain control using Other (benzocaine 20%). A time out was conducted at 09:00, prior to the start of the procedure. A Minimum amount of bleeding was controlled with Pressure. The procedure was tolerated well with a pain level of 0 throughout and a pain level of 0 following the procedure. Post Debridement Measurements: 2.8cm length x 2.3cm width x 0.1cm  depth; 0.506cm^3 volume. Character of Wound/Ulcer Post Debridement is improved. Severity of Tissue Post Debridement is: Fat layer exposed. Post procedure Diagnosis Wound #1: Same as Pre-Procedure Pre-procedure diagnosis of Wound #1 is a Venous Leg Ulcer located on the Left,Anterior Lower Leg . There was a Three Layer Compression Therapy Procedure by Deon Pilling, RN. Post procedure Diagnosis Wound #1: Same as  Pre-Procedure Wound #2 Pre-procedure diagnosis of Wound #2 is a Venous Leg Ulcer located on the Left,Medial Lower Leg . There was a Three Layer Compression Therapy Procedure by Deon Pilling, RN. Post procedure Diagnosis Wound #2: Same as Pre-Procedure Wound #3 Pre-procedure diagnosis of Wound #3 is a Venous Leg Ulcer located on the Right,Anterior Lower Leg . There was a Three Layer Compression Therapy Procedure by Deon Pilling, RN. Post procedure Diagnosis Wound #3: Same as Pre-Procedure Plan Follow-up Appointments: Return Appointment in 1 week. - Dr. Heber [place] Tuesday Bathing/ Shower/ Hygiene: May shower with protection but do not get wound dressing(s) wet. Edema Control - Lymphedema / SCD / Other: Elevate legs to the level of the heart or above for 30 minutes daily and/or when sitting, a frequency of: - 3-4 times a [place] throughout the [place]. Avoid standing for long periods of time. Exercise regularly Additional Orders / Instructions: Stop/Decrease Smoking WOUND #1: - Lower Leg Wound Laterality: Left, Anterior Cleanser: Soap and Water 1 x Per Week/ Discharge Instructions: May shower and wash wound with dial antibacterial soap and water prior to dressing change. Cleanser: Wound Cleanser 1 x Per Week/ Discharge Instructions: Cleanse the wound with wound cleanser prior to applying a clean dressing using gauze sponges, not tissue or cotton balls. Peri-Wound Care: Triamcinolone 15 (g) 1 x Per Week/ Discharge Instructions: Use triamcinolone 15 (g) as directed Peri-Wound Care: Zinc Oxide Ointment 30g tube 1 x Per Week/ Discharge Instructions: Apply Zinc Oxide to periwound with each dressing change Peri-Wound Care: Sween Lotion (Moisturizing lotion) 1 x Per Week/ Discharge Instructions: Apply moisturizing lotion as directed Prim Dressing: Maxorb Extra Calcium Alginate 2x2 in 1 x Per Week/ ary Discharge Instructions: Apply calcium alginate to wound bed as instructed Prim Dressing: Cutimed  Sorbact Swab 1 x Per Week/ ary Discharge Instructions: Apply to wound bed as instructed Secondary Dressing: ABD Pad, 8x10 1 x Per Week/ Discharge Instructions: Apply over primary dressing as directed. Com pression Wrap: ThreePress (3 layer compression wrap) 1 x Per Week/ Discharge Instructions: Apply three layer compression as directed. WOUND #2: - Lower Leg Wound Laterality: Left, Medial Cleanser: Soap and Water 1 x Per Week/ Discharge Instructions: May shower and wash wound with dial antibacterial soap and water prior to dressing change. Cleanser: Wound Cleanser 1 x Per Week/ Discharge Instructions: Cleanse the wound with wound cleanser prior to applying a clean dressing using gauze sponges, not tissue or cotton balls. Peri-Wound Care: Triamcinolone 15 (g) 1 x Per Week/ Discharge Instructions: Use triamcinolone 15 (g) as directed Peri-Wound Care: Zinc Oxide Ointment 30g tube 1 x Per Week/ Discharge Instructions: Apply Zinc Oxide to periwound with each dressing change Peri-Wound Care: Sween Lotion (Moisturizing lotion) 1 x Per Week/ Discharge Instructions: Apply moisturizing lotion as directed Prim Dressing: Maxorb Extra Calcium Alginate 2x2 in 1 x Per Week/ ary Discharge Instructions: Apply calcium alginate to wound bed as instructed Prim Dressing: Cutimed Sorbact Swab 1 x Per Week/ ary Discharge Instructions: Apply to wound bed as instructed Secondary Dressing: ABD Pad, 8x10 1 x Per Week/ Discharge Instructions: Apply over primary dressing as directed. Com pression Wrap: ThreePress (3  layer compression wrap) 1 x Per Week/ Discharge Instructions: Apply three layer compression as directed. WOUND #3: - Lower Leg Wound Laterality: Right, Anterior Cleanser: Soap and Water 1 x Per Week/ Discharge Instructions: May shower and wash wound with dial antibacterial soap and water prior to dressing change. Cleanser: Wound Cleanser 1 x Per Week/ Discharge Instructions: Cleanse the wound with  wound cleanser prior to applying a clean dressing using gauze sponges, not tissue or cotton balls. Peri-Wound Care: Triamcinolone 15 (g) 1 x Per Week/ Discharge Instructions: Use triamcinolone 15 (g) as directed Peri-Wound Care: Zinc Oxide Ointment 30g tube 1 x Per Week/ Discharge Instructions: Apply Zinc Oxide to periwound with each dressing change Peri-Wound Care: Sween Lotion (Moisturizing lotion) 1 x Per Week/ Discharge Instructions: Apply moisturizing lotion as directed Prim Dressing: Maxorb Extra Calcium Alginate 2x2 in 1 x Per Week/ ary Discharge Instructions: Apply calcium alginate to wound bed as instructed Prim Dressing: Cutimed Sorbact Swab 1 x Per Week/ ary Discharge Instructions: Apply to wound bed as instructed Secondary Dressing: ABD Pad, 8x10 1 x Per Week/ Discharge Instructions: Apply over primary dressing as directed. Com pression Wrap: ThreePress (3 layer compression wrap) 1 x Per Week/ Discharge Instructions: Apply three layer compression as directed. 1. In office sharp debridement 2. Sorbact and calcium alginate under 3 layer compression 3. Follow-up in 1 week Electronic Signature(s) Signed: 10/19/2021 2:22:54 PM By: Kalman Shan DO Signed: 10/19/2021 5:33:19 PM By: Deon Pilling RN, BSN Previous Signature: 10/19/2021 9:22:59 AM Version By: Kalman Shan DO Entered By: Deon Pilling on 10/19/2021 13:43:48 -------------------------------------------------------------------------------- HxROS Details Patient Name: Date of Service: [name] [name] 10/19/2021 9:15 A M Medical Record Number: [id number] Patient Account Number: [id number] Date of Birth/Sex: Treating RN: 22-Sep-1961 (60 y.o. [name] Primary Care Provider: Fonnie Jarvis Other Clinician: Referring Provider: Treating Provider/Extender: Dorann Ou in Treatment: 3 Information Obtained From Patient Chart Constitutional Symptoms (General Health) Medical  History: Past Medical History Notes: Stroke 2016 Migraine Eyes Medical History: Negative for: Cataracts; Glaucoma; Optic Neuritis Ear/Nose/Mouth/Throat Medical History: Negative for: Chronic sinus problems/congestion; Middle ear problems Hematologic/Lymphatic Medical History: Negative for: Anemia; Hemophilia; Human Immunodeficiency Virus; Lymphedema; Sickle Cell Disease Respiratory Medical History: Positive for: Chronic Obstructive Pulmonary Disease (COPD) Negative for: Aspiration; Asthma; Pneumothorax; Sleep Apnea; Tuberculosis Cardiovascular Medical History: Positive for: Deep Vein Thrombosis; Hypertension; Peripheral Venous Disease Negative for: Angina; Arrhythmia; Congestive Heart Failure; Coronary Artery Disease; Hypotension; Myocardial Infarction; Peripheral Arterial Disease; Phlebitis; Vasculitis Past Medical History Notes: PE Gastrointestinal Medical History: Negative for: Cirrhosis ; Colitis; Crohns; Hepatitis A; Hepatitis B; Hepatitis C Endocrine Medical History: Negative for: Type I Diabetes; Type II Diabetes Genitourinary Medical History: Negative for: End Stage Renal Disease Immunological Medical History: Negative for: Lupus Erythematosus; Raynauds; Scleroderma Integumentary (Skin) Medical History: Negative for: History of Burn Musculoskeletal Medical History: Negative for: Gout; Rheumatoid Arthritis; Osteoarthritis; Osteomyelitis Neurologic Medical History: Positive for: Neuropathy - left sciatic nerve pain Negative for: Dementia; Quadriplegia; Paraplegia; Seizure Disorder Oncologic Medical History: Negative for: Received Chemotherapy; Received Radiation Psychiatric Medical History: Negative for: Anorexia/bulimia; Confinement Anxiety Past Medical History Notes: Anxiety Immunizations Pneumococcal Vaccine: Received Pneumococcal Vaccination: No Implantable Devices No devices added Hospitalization / Surgery History Type of  Hospitalization/Surgery Spinal Surgery over 20 years ago Family and Social History Cancer: Yes - Mother; Diabetes: Yes - Father,Siblings; Heart Disease: Yes - Mother; Hypertension: Yes - Mother; Kidney Disease: No; Lung Disease: No; Seizures: No; Stroke: No; Thyroid Problems: No; Tuberculosis: No; Current every [place] smoker - 1 1/2  ppd; Marital Status - Divorced; Alcohol Use: Never; Drug Use: No History; Caffeine Use: Daily - coffee; Financial Concerns: No; Food, Clothing or Shelter Needs: No; Support System Lacking: No; Transportation Concerns: No Electronic Signature(s) Signed: 10/19/2021 9:22:59 AM By: Kalman Shan DO Signed: 10/19/2021 5:33:19 PM By: Deon Pilling RN, BSN Entered By: Kalman Shan on 10/19/2021 09:18:07 -------------------------------------------------------------------------------- SuperBill Details Patient Name: Date of Service: [name] [name] 10/19/2021 Medical Record Number: [id number] Patient Account Number: [id number] Date of Birth/Sex: Treating RN: August 03, 1961 (60 y.o. [name], [name] Primary Care Provider: Fonnie Jarvis Other Clinician: Referring Provider: Treating Provider/Extender: Dorann Ou in Treatment: 3 Diagnosis Coding ICD-10 Codes Code Description [phone] Chronic venous hypertension (idiopathic) with ulcer of bilateral lower extremity L97.812 Non-pressure chronic ulcer of other part of right lower leg with fat layer exposed L97.822 Non-pressure chronic ulcer of other part of left lower leg with fat layer exposed F17.210 Nicotine dependence, cigarettes, uncomplicated T70 Essential (primary) hypertension J44.9 Chronic obstructive pulmonary disease, unspecified Facility Procedures CPT4 Code: [id number] Description: 00923 - DEB SUBQ TISSUE 20 SQ CM/< ICD-10 Diagnosis Description L97.822 Non-pressure chronic ulcer of other part of left lower leg with fat layer exposed Modifier: Quantity: 1 CPT4 Code:  [id number] Description: (Facility Use Only) 33354TG - APPLY [place] COMPRS LWR RT LEG Modifier: 58 Quantity: 1 Physician Procedures : CPT4 Code Description Modifier [id number] 73428 - WC PHYS SUBQ TISS 20 SQ CM ICD-10 Diagnosis Description L97.822 Non-pressure chronic ulcer of other part of left lower leg with fat layer exposed Quantity: 1 Electronic Signature(s) Signed: 10/19/2021 9:22:59 AM By: Kalman Shan DO Entered By: Kalman Shan on 10/19/2021 09:22:45

## 2021-10-20 DIAGNOSIS — B029 Zoster without complications: Secondary | ICD-10-CM | POA: Diagnosis not present

## 2021-10-20 DIAGNOSIS — Z7689 Persons encountering health services in other specified circumstances: Secondary | ICD-10-CM | POA: Diagnosis not present

## 2021-10-20 DIAGNOSIS — K5903 Drug induced constipation: Secondary | ICD-10-CM | POA: Diagnosis not present

## 2021-10-20 NOTE — Progress Notes (Signed)
CAROLL, CUNNINGTON ([id number]) Visit Report for 10/19/2021 Arrival Information Details Patient Name: Date of Service: [name] [name] 10/19/2021 9:15 A M Medical Record Number: [id number] Patient Account Number: [id number] Date of Birth/Sex: Treating RN: [birth year][name] (60 y.o. [name] [name] Primary Care [name] [name]: [name] [name] Other Clinician: Referring [name] [name]: Treating [name] [name]/Extender: [name] [name] in Treatment: 3 Visit Information History Since Last Visit Added or deleted any medications: No Patient Arrived: [name] [name] Any new allergies or adverse reactions: No Arrival Time: 08:30 Had a fall or experienced change in No Accompanied By: self activities of daily living that [name] affect Transfer Assistance: None risk of falls: Patient Identification Verified: Yes Signs or symptoms of abuse/neglect since last visito No Patient Requires Transmission-Based Precautions: No Hospitalized since last visit: No Patient Has Alerts: Yes Implantable device outside of the clinic excluding No Patient Alerts: Patient on Blood Thinner cellular tissue based products placed in the center since last visit: Pain Present Now: Yes Electronic Signature(s) Signed: 10/20/2021 5:23:39 PM By: [name] Catholic RN Entered By: [name] [name] on 10/19/2021 08:31:38 -------------------------------------------------------------------------------- Compression Therapy Details Patient Name: Date of Service: [name] [name] 10/19/2021 9:15 A M Medical Record Number: [id number] Patient Account Number: [id number] Date of Birth/Sex: Treating RN: [name] [name] (60 y.o. [name] [name] Primary Care [name] [name]: [name] [name] Other Clinician: Referring [name] [name]: Treating [name] [name]/Extender: [name] [name] in Treatment: 3 Compression Therapy Performed for Wound Assessment: Wound #1 Left,Anterior Lower Leg Performed By: Clinician [name] Pilling, RN Compression Type: Three  Layer Post Procedure Diagnosis Same as Pre-procedure Electronic Signature(s) Signed: 10/19/2021 5:33:19 PM By: [name] Pilling RN, BSN Entered By: [name] [name] on 10/19/2021 09:05:38 -------------------------------------------------------------------------------- Compression Therapy Details Patient Name: Date of Service: [name] [name] 10/19/2021 9:15 A M Medical Record Number: [id number] Patient Account Number: [id number] Date of Birth/Sex: Treating RN: [birth year][name] (60 y.o. [name] [name] Primary Care Ayerim Berquist: [name] [name] Other Clinician: Referring [name] [name]: Treating [name] [name]/Extender: [name] [name] in Treatment: 3 Compression Therapy Performed for Wound Assessment: Wound #3 Right,Anterior Lower Leg Performed By: Clinician [name] Pilling, RN Compression Type: Three Layer Post Procedure Diagnosis Same as Pre-procedure Electronic Signature(s) Signed: 10/19/2021 5:33:19 PM By: [name] Pilling RN, BSN Entered By: [name] [name] on 10/19/2021 09:05:38 -------------------------------------------------------------------------------- Compression Therapy Details Patient Name: Date of Service: [name] [name] 10/19/2021 9:15 A M Medical Record Number: [id number] Patient Account Number: [id number] Date of Birth/Sex: Treating RN: [name] [name] (60 y.o. [name] [name] Primary Care Malyiah Fellows: [name] [name] Other Clinician: Referring [name] [name]: Treating [name] [name]/Extender: [name] [name] in Treatment: 3 Compression Therapy Performed for Wound Assessment: Wound #2 Left,Medial Lower Leg Performed By: Clinician [name] Pilling, RN Compression Type: Three Layer Post Procedure Diagnosis Same as Pre-procedure Electronic Signature(s) Signed: 10/19/2021 5:33:19 PM By: [name] Pilling RN, BSN Entered By: [name] [name] on 10/19/2021 09:05:38 -------------------------------------------------------------------------------- Encounter Discharge  Information Details Patient Name: Date of Service: [name] [name] 10/19/2021 9:15 A M Medical Record Number: [id number] Patient Account Number: [id number] Date of Birth/Sex: Treating RN: [name] [name] (60 y.o. [name] [name] Primary Care [name] [name]: [name] [name] Other Clinician: Referring [name] [name]: Treating [name] [name]/Extender: [name] [name] in Treatment: 3 Encounter Discharge Information Items Post Procedure Vitals Discharge Condition: Stable Temperature (F): 97.9 Ambulatory Status: Walker Pulse (bpm): 60 Discharge Destination: Home Respiratory Rate (breaths/min): 20 Transportation: Private Auto Blood Pressure (mmHg): 156/84 Accompanied By: self Schedule Follow-up Appointment: Yes Clinical Summary of Care: Patient Declined Electronic Signature(s) Signed: 10/20/2021 5:23:39 PM By: [name] Catholic RN Entered By: Minus Liberty,  [name] [name] on 10/19/2021 13:44:18 -------------------------------------------------------------------------------- Lower Extremity Assessment Details Patient Name: Date of Service: [name] [name] 10/19/2021 9:15 A M Medical Record Number: [id number] Patient Account Number: [id number] Date of Birth/Sex: Treating RN: [name] (60 y.o. [name] [name] Primary Care [name] [name]: [name] [name] Other Clinician: Referring [name] [name]: Treating [name] [name]/Extender: [name] [name] in Treatment: 3 Edema Assessment Assessed: [Left: No] [Right: No] Edema: [Left: Yes] [Right: Yes] Calf Left: Right: Point of Measurement: 34 cm From Medial Instep 44.5 cm 45.2 cm Ankle Left: Right: Point of Measurement: 9 cm From Medial Instep 29.3 cm 29 cm Electronic Signature(s) Signed: 10/20/2021 5:23:39 PM By: [name] Catholic RN Entered By: [name] [name] on 10/19/2021 08:37:21 -------------------------------------------------------------------------------- Multi Wound Chart Details Patient Name: Date of Service: [name] [name] 10/19/2021 9:15 A M Medical Record Number: [id number] Patient Account Number: [id number] Date of Birth/Sex: Treating RN: [name] [name] (60 y.o. [name] [name] Primary Care Kirstan Fentress: [name] [name] Other Clinician: Referring Anyela Napierkowski: Treating Trevontae Lindahl/Extender: [name] [name] in Treatment: 3 Vital Signs Height(in): 108 Pulse(bpm): 60 Weight(lbs): 216 Blood Pressure(mmHg): 156/84 Body Mass Index(BMI): 30 Temperature(F): 97.9 Respiratory Rate(breaths/min): 20 Photos: [1:No Photos Left, Anterior Lower Leg] [2:No Photos Left, Medial Lower Leg] [3:No Photos Right, Anterior Lower Leg] Wound Location: [1:Gradually Appeared] [2:Gradually Appeared] [3:Gradually Appeared] Wounding Event: [1:Venous Leg Ulcer] [2:Venous Leg Ulcer] [3:Venous Leg Ulcer] Primary Etiology: [1:Chronic Obstructive Pulmonary] [2:Chronic Obstructive Pulmonary] [3:Chronic Obstructive Pulmonary] Comorbid History: [1:Disease (COPD), Deep Vein Thrombosis, Hypertension, Peripheral Venous Disease, Neuropathy 09/14/2021] [2:Disease (COPD), Deep Vein Thrombosis, Hypertension, Peripheral Venous Disease, Neuropathy 09/14/2021] [3:Disease (COPD), Deep Vein  Thrombosis, Hypertension, Peripheral Venous Disease, Neuropathy 09/14/2021] Date Acquired: [1:3] [2:3] [3:3] [name] of Treatment: [1:Open] [2:Open] [3:Open] Wound Status: [1:2.8x2.3x0.1] [2:0.5x0.4x0.1] [3:0.7x0.3x0.1] Measurements L x W x D (cm) [1:5.058] [2:0.157] [3:0.165] A (cm) : rea [1:0.506] [2:0.016] [3:0.016] Volume (cm) : [1:21.10%] [2:69.70%] [3:67.70%] % Reduction in Area: [1:21.10%] [2:69.20%] [3:68.60%] % Reduction in Volume: [1:Full Thickness Without Exposed] [2:Full Thickness Without Exposed] [3:Full Thickness Without Exposed] Classification: [1:Support Structures Medium] [2:Support Structures Medium] [3:Support Structures Medium] Exudate A mount: [1:Serosanguineous] [2:Serosanguineous] [3:Serosanguineous] Exudate Type: [1:red,  brown] [2:red, brown] [3:red, brown] Exudate Color: [1:Flat and Intact] [2:Flat and Intact] [3:Flat and Intact] Wound Margin: [1:Medium (34-66%)] [2:Large (67-100%)] [3:Large (67-100%)] Granulation A mount: [1:Pink] [2:Pink] [3:Pink] Granulation Quality: [1:Medium (34-66%)] [2:None Present (0%)] [3:Small (1-33%)] Necrotic A mount: [1:Fat Layer (Subcutaneous Tissue): Yes Fat Layer (Subcutaneous Tissue): Yes Fat Layer (Subcutaneous Tissue): Yes] Exposed Structures: [1:Fascia: No Tendon: No Muscle: No Joint: No Bone: No Small (1-33%)] [2:Fascia: No Tendon: No Muscle: No Joint: No Bone: No Small (1-33%)] [3:Fascia: No Tendon: No Muscle: No Joint: No Bone: No Small (1-33%)] Epithelialization: [1:Debridement - Excisional] [2:N/A] [3:N/A] Debridement: Pre-procedure Verification/Time Out 09:00 [2:N/A] [3:N/A] Taken: [1:Other] [2:N/A] [3:N/A] Pain Control: [1:Subcutaneous, Slough] [2:N/A] [3:N/A] Tissue Debrided: [1:Skin/Subcutaneous Tissue] [2:N/A] [3:N/A] Level: [1:6.44] [2:N/A] [3:N/A] Debridement A (sq cm): [1:rea Curette] [2:N/A] [3:N/A] Instrument: [1:Minimum] [2:N/A] [3:N/A] Bleeding: [1:Pressure] [2:N/A] [3:N/A] Hemostasis A chieved: [1:0] [2:N/A] [3:N/A] Procedural Pain: [1:0] [2:N/A] [3:N/A] Post Procedural Pain: [1:Procedure was tolerated well] [2:N/A] [3:N/A] Debridement Treatment Response: [1:2.8x2.3x0.1] [2:N/A] [3:N/A] Post Debridement Measurements L x W x D (cm) [1:0.506] [2:N/A] [3:N/A] Post Debridement Volume: (cm) [1:Compression Therapy] [2:Compression Therapy] [3:Compression Therapy] Procedures Performed: [1:Debridement] Treatment Notes Electronic Signature(s) Signed: 10/19/2021 9:22:59 AM By: Kalman Shan DO Signed: 10/19/2021 5:33:19 PM By: [name] Pilling RN, BSN Entered By: Kalman Shan on 10/19/2021 09:16:45 -------------------------------------------------------------------------------- Multi-Disciplinary Care Plan Details Patient Name: Date of Service: Steele Berg, DA  [name] 10/19/2021 9:15 A M Medical Record Number: [id number] Patient Account Number: [id number] Date of Birth/Sex: Treating RN: [name][birth year] (60 y.o. [name] [name], Tammi Klippel Primary Care Vance Belcourt: [name] [name] Other Clinician: Referring Bobbie Valletta: Treating Selma Mink/Extender: [name] [name] in Treatment: 3 Active Inactive Nutrition Nursing Diagnoses: Potential for alteratiion in Nutrition/Potential for imbalanced nutrition Goals: Patient/caregiver agrees to and verbalizes understanding of need to obtain nutritional consultation Date Initiated: 09/28/2021 Target Resolution Date: 11/05/2021 Goal Status: Active Interventions: Assess patient nutrition upon admission and as needed per policy Provide education on nutrition Treatment Activities: Education provided on Nutrition : 10/08/2021 Patient referred to Primary Care Physician for further nutritional evaluation : 09/28/2021 Notes: Pain, Acute or Chronic Nursing Diagnoses: Pain, acute or chronic: actual or potential Potential alteration in comfort, pain Goals: Patient will verbalize adequate pain control and receive pain control interventions during procedures as needed Date Initiated: 09/28/2021 Target Resolution Date: 11/05/2021 Goal Status: Active Patient/caregiver will verbalize comfort level met Date Initiated: 09/28/2021 Target Resolution Date: 11/04/2021 Goal Status: Active Interventions: Encourage patient to take pain medications as prescribed Provide education on pain management Reposition patient for comfort Treatment Activities: Administer pain control measures as ordered : 09/28/2021 Notes: Wound/Skin Impairment Nursing Diagnoses: Knowledge deficit related to ulceration/compromised skin integrity Goals: Patient/caregiver will verbalize understanding of skin care regimen Date Initiated: 09/28/2021 Target Resolution Date: 11/05/2021 Goal Status: Active Interventions: Assess  patient/caregiver ability to perform ulcer/skin care regimen upon admission and as needed Assess ulceration(s) every visit Provide education on smoking Provide education on ulcer and skin care Treatment Activities: Skin care regimen initiated : 09/28/2021 Smoking cessation education : 09/28/2021 Topical wound management initiated : 09/28/2021 Notes: Electronic Signature(s) Signed: 10/19/2021 5:33:19 PM By: [name] Pilling RN, BSN Entered By: [name] [name] on 10/19/2021 09:07:02 -------------------------------------------------------------------------------- Pain Assessment Details Patient Name: Date of Service: [name] [name] 10/19/2021 9:15 A M Medical Record Number: [id number] Patient Account Number: [id number] Date of Birth/Sex: Treating RN: [birth year] [name] (60 y.o. [name] [name] Primary Care Aundreya Souffrant: [name] [name] Other Clinician: Referring Nagee Goates: Treating Emaley Applin/Extender: [name] [name] in Treatment: 3 Active Problems Location of Pain Severity and Description of Pain Patient Has Paino Yes Site Locations Pain Location: Generalized Pain With Dressing Change: No Duration of the Pain. Constant / Intermittento Constant Rate the pain. Current Pain Level: 10 Worst Pain Level: 10 Least Pain Level: 7 Tolerable Pain Level: 7 Character of Pain Describe the Pain: Aching, Stabbing Pain Management and Medication Current Pain Management: Medication: No Cold Application: No Rest: Yes Massage: No Activity: No T.E.N.S.: No Heat Application: No Leg drop or elevation: No Is the Current Pain Management Adequate: Adequate How does your wound impact your activities of daily livingo Sleep: No Bathing: No Appetite: No Relationship With Others: No Bladder Continence: No Emotions: No Bowel Continence: No Work: No Toileting: No Drive: No Dressing: No Hobbies: No Electronic Signature(s) Signed: 10/20/2021 5:23:39 PM By: [name] Catholic  RN Entered By: [name] [name] on 10/19/2021 08:34:49 -------------------------------------------------------------------------------- Patient/Caregiver Education Details Patient Name: Date of Service: [name] [name] 12/13/2022andnbsp9:15 [place] Record Number: [id number] Patient Account Number: [id number] Date of Birth/Gender: Treating RN: [name] [name] (60 y.o. [name] [name] Primary Care Physician: [name] [name] Other Clinician: Referring Physician: Treating Physician/Extender: [name] [name] in Treatment: 3 Education Assessment Education Provided To: Patient Education Topics Provided Wound/Skin Impairment: Handouts: Skin Care Do's and Dont's Methods: Explain/Verbal Responses: Reinforcements needed Electronic Signature(s) Signed: 10/19/2021 5:33:19 PM By: [name] Pilling RN, BSN Entered By: [name] [name] on 10/19/2021  09:07:44 -------------------------------------------------------------------------------- Wound Assessment Details Patient Name: Date of Service: [name] [name] 10/19/2021 9:15 A M Medical Record Number: [id number] Patient Account Number: [id number] Date of Birth/Sex: Treating RN: [birth year] [name] (60 y.o. [name] [name] Primary Care Larkyn Greenberger: [name] [name] Other Clinician: Referring Jacalynn Buzzell: Treating Vinicius Brockman/Extender: [name] [name] in Treatment: 3 Wound Status Wound Number: 1 Primary Venous Leg Ulcer Etiology: Wound Location: Left, Anterior Lower Leg Wound Open Wounding Event: Gradually Appeared Status: Date Acquired: 09/14/2021 Comorbid Chronic Obstructive Pulmonary Disease (COPD), Deep Vein [name] Of Treatment: 3 History: Thrombosis, Hypertension, Peripheral Venous Disease, Neuropathy Clustered Wound: No Photos Photo Uploaded By: Donavan Burnet on 10/20/2021 15:50:30 Wound Measurements Length: (cm) 2.8 Width: (cm) 2.3 Depth: (cm) 0.1 Area: (cm) 5.058 Volume: (cm) 0.506 %  Reduction in Area: 21.1% % Reduction in Volume: 21.1% Epithelialization: Small (1-33%) Tunneling: No Undermining: No Wound Description Classification: Full Thickness Without Exposed Support Structures Wound Margin: Flat and Intact Exudate Amount: Medium Exudate Type: Serosanguineous Exudate Color: red, brown Foul Odor After Cleansing: No Slough/Fibrino Yes Wound Bed Granulation Amount: Medium (34-66%) Exposed Structure Granulation Quality: Pink Fascia Exposed: No Necrotic Amount: Medium (34-66%) Fat Layer (Subcutaneous Tissue) Exposed: Yes Necrotic Quality: Adherent Slough Tendon Exposed: No Muscle Exposed: No Joint Exposed: No Bone Exposed: No Treatment Notes Wound #1 (Lower Leg) Wound Laterality: Left, Anterior Cleanser Soap and Water Discharge Instruction: [name] shower and wash wound with dial antibacterial soap and water prior to dressing change. Wound Cleanser Discharge Instruction: Cleanse the wound with wound cleanser prior to applying a clean dressing using gauze sponges, not tissue or cotton balls. Peri-Wound Care Triamcinolone 15 (g) Discharge Instruction: Use triamcinolone 15 (g) as directed Zinc Oxide Ointment 30g tube Discharge Instruction: Apply Zinc Oxide to periwound with each dressing change Sween Lotion (Moisturizing lotion) Discharge Instruction: Apply moisturizing lotion as directed Topical Primary Dressing Maxorb Extra Calcium Alginate 2x2 in Discharge Instruction: Apply calcium alginate to wound bed as instructed Cutimed Sorbact Swab Discharge Instruction: Apply to wound bed as instructed Secondary Dressing ABD Pad, 8x10 Discharge Instruction: Apply over primary dressing as directed. Secured With Compression Wrap ThreePress (3 layer compression wrap) Discharge Instruction: Apply three layer compression as directed. Compression Stockings Add-Ons Electronic Signature(s) Signed: 10/20/2021 5:23:39 PM By: [name] Catholic RN Entered By: [name] [name] on 10/19/2021 08:42:53 -------------------------------------------------------------------------------- Wound Assessment Details Patient Name: Date of Service: [name] [name] 10/19/2021 9:15 A M Medical Record Number: [id number] Patient Account Number: [id number] Date of Birth/Sex: Treating RN: [name] (60 y.o. [name] [name] Primary Care Mahesh Sizemore: [name] [name] Other Clinician: Referring Dea Bitting: Treating Karmina Zufall/Extender: [name] [name] in Treatment: 3 Wound Status Wound Number: 2 Primary Venous Leg Ulcer Etiology: Wound Location: Left, Medial Lower Leg Wound Open Wounding Event: Gradually Appeared Status: Date Acquired: 09/14/2021 Comorbid Chronic Obstructive Pulmonary Disease (COPD), Deep Vein [name] Of Treatment: 3 History: Thrombosis, Hypertension, Peripheral Venous Disease, Neuropathy Clustered Wound: No Photos Photo Uploaded By: Donavan Burnet on 10/20/2021 15:50:30 Wound Measurements Length: (cm) 0.5 Width: (cm) 0.4 Depth: (cm) 0.1 Area: (cm) 0.157 Volume: (cm) 0.016 % Reduction in Area: 69.7% % Reduction in Volume: 69.2% Epithelialization: Small (1-33%) Tunneling: No Undermining: No Wound Description Classification: Full Thickness Without Exposed Support Structures Wound Margin: Flat and Intact Exudate Amount: Medium Exudate Type: Serosanguineous Exudate Color: red, brown Foul Odor After Cleansing: No Slough/Fibrino No Wound Bed Granulation Amount: Large (67-100%) Exposed Structure Granulation Quality: Pink Fascia Exposed: No Necrotic Amount: None Present (0%) Fat Layer (Subcutaneous Tissue) Exposed: Yes Tendon Exposed: No  Muscle Exposed: No Joint Exposed: No Bone Exposed: No Treatment Notes Wound #2 (Lower Leg) Wound Laterality: Left, Medial Cleanser Soap and Water Discharge Instruction: [name] shower and wash wound with dial antibacterial soap and water prior to dressing change. Wound  Cleanser Discharge Instruction: Cleanse the wound with wound cleanser prior to applying a clean dressing using gauze sponges, not tissue or cotton balls. Peri-Wound Care Triamcinolone 15 (g) Discharge Instruction: Use triamcinolone 15 (g) as directed Zinc Oxide Ointment 30g tube Discharge Instruction: Apply Zinc Oxide to periwound with each dressing change Sween Lotion (Moisturizing lotion) Discharge Instruction: Apply moisturizing lotion as directed Topical Primary Dressing Maxorb Extra Calcium Alginate 2x2 in Discharge Instruction: Apply calcium alginate to wound bed as instructed Cutimed Sorbact Swab Discharge Instruction: Apply to wound bed as instructed Secondary Dressing ABD Pad, 8x10 Discharge Instruction: Apply over primary dressing as directed. Secured With Compression Wrap ThreePress (3 layer compression wrap) Discharge Instruction: Apply three layer compression as directed. Compression Stockings Add-Ons Electronic Signature(s) Signed: 10/20/2021 5:23:39 PM By: [name] Catholic RN Entered By: [name] [name] on 10/19/2021 08:41:38 -------------------------------------------------------------------------------- Wound Assessment Details Patient Name: Date of Service: [name] [name] 10/19/2021 9:15 A M Medical Record Number: [id number] Patient Account Number: [id number] Date of Birth/Sex: Treating RN: [birth year] (60 y.o. [name] [name] Primary Care Rama Sorci: [name] [name] Other Clinician: Referring Lander Eslick: Treating Rainier Feuerborn/Extender: [name] [name] in Treatment: 3 Wound Status Wound Number: 3 Primary Venous Leg Ulcer Etiology: Wound Location: Right, Anterior Lower Leg Wound Open Wounding Event: Gradually Appeared Status: Date Acquired: 09/14/2021 Comorbid Chronic Obstructive Pulmonary Disease (COPD), Deep Vein [name] Of Treatment: 3 History: Thrombosis, Hypertension, Peripheral Venous Disease, Neuropathy Clustered Wound:  No Photos Photo Uploaded By: Donavan Burnet on 10/20/2021 15:51:07 Wound Measurements Length: (cm) 0.7 Width: (cm) 0.3 Depth: (cm) 0.1 Area: (cm) 0.165 Volume: (cm) 0.016 % Reduction in Area: 67.7% % Reduction in Volume: 68.6% Epithelialization: Small (1-33%) Tunneling: No Undermining: No Wound Description Classification: Full Thickness Without Exposed Support Str Wound Margin: Flat and Intact Exudate Amount: Medium Exudate Type: Serosanguineous Exudate Color: red, brown uctures Wound Bed Granulation Amount: Large (67-100%) Exposed Structure Granulation Quality: Pink Fascia Exposed: No Necrotic Amount: Small (1-33%) Fat Layer (Subcutaneous Tissue) Exposed: Yes Necrotic Quality: Adherent Slough Tendon Exposed: No Muscle Exposed: No Joint Exposed: No Bone Exposed: No Treatment Notes Wound #3 (Lower Leg) Wound Laterality: Right, Anterior Cleanser Soap and Water Discharge Instruction: [name] shower and wash wound with dial antibacterial soap and water prior to dressing change. Wound Cleanser Discharge Instruction: Cleanse the wound with wound cleanser prior to applying a clean dressing using gauze sponges, not tissue or cotton balls. Peri-Wound Care Triamcinolone 15 (g) Discharge Instruction: Use triamcinolone 15 (g) as directed Zinc Oxide Ointment 30g tube Discharge Instruction: Apply Zinc Oxide to periwound with each dressing change Sween Lotion (Moisturizing lotion) Discharge Instruction: Apply moisturizing lotion as directed Topical Primary Dressing Maxorb Extra Calcium Alginate 2x2 in Discharge Instruction: Apply calcium alginate to wound bed as instructed Cutimed Sorbact Swab Discharge Instruction: Apply to wound bed as instructed Secondary Dressing ABD Pad, 8x10 Discharge Instruction: Apply over primary dressing as directed. Secured With Compression Wrap ThreePress (3 layer compression wrap) Discharge Instruction: Apply three layer compression as  directed. Compression Stockings Add-Ons Electronic Signature(s) Signed: 10/20/2021 5:23:39 PM By: [name] Catholic RN Entered By: [name] [name] on 10/19/2021 08:46:01 -------------------------------------------------------------------------------- Vitals Details Patient Name: Date of Service: [name] [name] 10/19/2021 9:15 A M Medical Record Number: [id number] Patient Account Number: [id number] Date of Birth/Sex:  Treating RN: [name][birth year] (60 y.o. [name] [name] Primary Care Aireanna Luellen: [name] [name] Other Clinician: Referring Rolonda Pontarelli: Treating Treylon Henard/Extender: [name] [name] in Treatment: 3 Vital Signs Time Taken: 08:30 Temperature (F): 97.9 Height (in): 71 Pulse (bpm): 60 Weight (lbs): 216 Respiratory Rate (breaths/min): 20 Body Mass Index (BMI): 30.1 Blood Pressure (mmHg): 156/84 Reference Range: 80 - 120 mg / dl Electronic Signature(s) Signed: 10/20/2021 5:23:39 PM By: [name] Catholic RN Entered By: [name] [name] on 10/19/2021 08:33:13

## 2021-10-26 ENCOUNTER — Other Ambulatory Visit: Payer: Self-pay

## 2021-10-26 ENCOUNTER — Encounter (HOSPITAL_BASED_OUTPATIENT_CLINIC_OR_DEPARTMENT_OTHER): Payer: Medicaid Other | Admitting: Internal Medicine

## 2021-10-26 DIAGNOSIS — L97822 Non-pressure chronic ulcer of other part of left lower leg with fat layer exposed: Secondary | ICD-10-CM | POA: Diagnosis not present

## 2021-10-26 DIAGNOSIS — Z7689 Persons encountering health services in other specified circumstances: Secondary | ICD-10-CM | POA: Diagnosis not present

## 2021-10-26 DIAGNOSIS — L97812 Non-pressure chronic ulcer of other part of right lower leg with fat layer exposed: Secondary | ICD-10-CM

## 2021-10-26 NOTE — Progress Notes (Signed)
KHA, HARI ([id number]) Visit Report for 10/26/2021 Fall Risk Assessment Details Patient Name: Date of Service: [name] [name] 10/26/2021 9:15 A M Medical Record Number: [id number] Patient Account Number: [id number] Date of Birth/Sex: Treating RN: [birth year] (60 y.o. [name] Primary Care Heyward Douthit: Fonnie Jarvis Other Clinician: Referring Anye Brose: Treating Roberto Hlavaty/Extender: Dorann Ou in Treatment: 4 Fall Risk Assessment Items Have you had 2 or more falls in the last 12 monthso 0 Yes Have you had any fall that resulted in injury in the last 12 monthso 0 Yes FALLS RISK SCREEN History of falling - immediate or within 3 months 25 Yes Secondary diagnosis (Do you have 2 or more medical diagnoseso) 0 No Ambulatory aid None/bed rest/wheelchair/nurse 0 No Crutches/cane/walker 15 Yes Furniture 0 No Intravenous therapy Access/Saline/Heparin Lock 0 No Gait/Transferring Normal/ bed rest/ wheelchair 0 No Weak (short steps with or without shuffle, stooped but able to lift head while walking, may seek 10 Yes support from furniture) Impaired (short steps with shuffle, may have difficulty arising from chair, head down, impaired 0 No balance) Mental Status Oriented to own ability 0 Yes Notes per patient falling and not sure why. Electronic Signature(s) Signed: 10/26/2021 5:06:43 PM By: Deon Pilling RN, BSN Entered By: Deon Pilling on 10/26/2021 09:59:18

## 2021-10-26 NOTE — Progress Notes (Signed)
KHIREE, BUKHARI ([id number]) Visit Report for 10/26/2021 Arrival Information Details Patient Name: Date of Service: [name] [name] 10/26/2021 9:15 A M Medical Record Number: [id number] Patient Account Number: [id number] Date of Birth/Sex: Treating RN: [name] [name] (60 y.o. [name] [name], [name] [name] Primary Care [name] [name]: [name] [name] Other Clinician: Referring [name] [name]: Treating [name] [name]/Extender: [name] [name] in Treatment: 4 Visit Information History Since Last Visit Added or deleted any medications: No Patient Arrived: [name] [name] Any new allergies or adverse reactions: No Arrival Time: 09:35 Had a fall or experienced change in Yes Accompanied By: self activities of daily living that may affect Transfer Assistance: None risk of falls: Patient Identification Verified: Yes Signs or symptoms of abuse/neglect since last visito No Secondary Verification Process Completed: Yes Hospitalized since last visit: No Patient Requires Transmission-Based Precautions: No Implantable device outside of the clinic excluding No Patient Has Alerts: Yes cellular tissue based products placed in the center Patient Alerts: Patient on Blood Thinner since last visit: Has Dressing in Place as Prescribed: No Has Compression in Place as Prescribed: No Pain Present Now: Yes Notes Per patient has been falling since saturday, per patient woke up in a different unsure what is going on. Per patient has bruising to right side and back- nurse noted x4 areas. Per patient in severe back and neck pain since falling. Per patient has not went to ED for evaluation. Per patient may go today. Patient BP 188/100 manually. Per patient has not taken BP medication. Explained to patient the need for evaluation of falling, elevated pain in back and neck, along with this high BP. Patient in agreement and may go after visit today. [name] [name] made aware. Electronic Signature(s) Signed: 10/26/2021 5:06:43 PM By: [name] Pilling RN, BSN Entered By: [name] [name] on 10/26/2021 09:42:53 -------------------------------------------------------------------------------- Compression Therapy Details Patient Name: Date of Service: [name] [name] 10/26/2021 9:15 A M Medical Record Number: [id number] Patient Account Number: [id number] Date of Birth/Sex: Treating RN: [name] [name] (60 y.o. [name] [name] Primary Care [name] [name]: [name] [name] Other Clinician: Referring Shirah Roseman: Treating [name] [name]/Extender: [name] [name] in Treatment: 4 Compression Therapy Performed for Wound Assessment: Wound #1 Left,Anterior Lower Leg Performed By: Clinician [name] Pilling, RN Compression Type: Three Layer Post Procedure Diagnosis Same as Pre-procedure Electronic Signature(s) Signed: 10/26/2021 5:06:43 PM By: [name] Pilling RN, BSN Entered By: [name] [name] on 10/26/2021 10:02:29 -------------------------------------------------------------------------------- Compression Therapy Details Patient Name: Date of Service: [name] 10/26/2021 9:15 A M Medical Record Number: [id number] Patient Account Number: [id number] Date of Birth/Sex: Treating RN: [name] [name] (61 y.o. [name] [name] Primary Care Dia Donate: [name] [name] Other Clinician: Referring Jes Costales: Treating Broderic Bara/Extender: [name] [name] in Treatment: 4 Compression Therapy Performed for Wound Assessment: Wound #3 Right,Anterior Lower Leg Performed By: Clinician [name] Pilling, RN Compression Type: Three Layer Post Procedure Diagnosis Same as Pre-procedure Electronic Signature(s) Signed: 10/26/2021 5:06:43 PM By: [name] Pilling RN, BSN Entered By: [name] [name] on 10/26/2021 10:02:30 -------------------------------------------------------------------------------- Compression Therapy Details Patient Name: Date of Service: [name] 10/26/2021 9:15 A M Medical Record Number: [id number] Patient Account  Number: [id number] Date of Birth/Sex: Treating RN: [name] [name] (60 y.o. [name] [name] Primary Care Jenah Vanasten: [name] [name] Other Clinician: Referring Alyscia Carmon: Treating Phong Isenberg/Extender: [name] [name] in Treatment: 4 Compression Therapy Performed for Wound Assessment: Wound #2 Left,Medial Lower Leg Performed By: Clinician [name] Pilling, RN Compression Type: Three Layer Post Procedure Diagnosis Same as Pre-procedure Electronic Signature(s) Signed: 10/26/2021 5:06:43 PM By: [name] Pilling RN, BSN Entered By:  [name] [name] on 10/26/2021 10:02:30 -------------------------------------------------------------------------------- Encounter Discharge Information Details Patient Name: Date of Service: [name] [name] 10/26/2021 9:15 A M Medical Record Number: [id number] Patient Account Number: [id number] Date of Birth/Sex: Treating RN: [name] [name] (60 y.o. [name] [name] Primary Care [name] [name]: [name] [name] Other Clinician: Referring Maryssa Giampietro: Treating [name] [name]/Extender: [name] [name] in Treatment: 4 Encounter Discharge Information Items Post Procedure Vitals Discharge Condition: Stable Temperature (F): 97.8 Ambulatory Status: Walker Pulse (bpm): 80 Discharge Destination: Home Respiratory Rate (breaths/min): 20 Transportation: Private Auto Blood Pressure (mmHg): 180/100 Accompanied By: self Schedule Follow-up Appointment: No Clinical Summary of Care: Electronic Signature(s) Signed: 10/26/2021 5:06:43 PM By: [name] Pilling RN, BSN Entered By: [name] [name] on 10/26/2021 10:06:30 -------------------------------------------------------------------------------- Lower Extremity Assessment Details Patient Name: Date of Service: [name] [name] 10/26/2021 9:15 A M Medical Record Number: [id number] Patient Account Number: [id number] Date of Birth/Sex: Treating RN: [name] [name] (60 y.o. [name] [name] Primary Care [name] [name]:  [name] [name] Other Clinician: Referring [name] [name]: Treating [name] [name]/Extender: [name] [name] in Treatment: 4 Edema Assessment Assessed: [name] [name]: Yes] [name] [name]: Yes] Edema: [Left: Yes] [Right: Yes] Calf Left: Right: Point of Measurement: 34 cm From Medial Instep 49 cm 47 cm Ankle Left: Right: Point of Measurement: 9 cm From Medial Instep 29 cm 29 cm Vascular Assessment Pulses: Dorsalis Pedis Palpable: [Left:Yes] [Right:Yes] Electronic Signature(s) Signed: 10/26/2021 5:06:43 PM By: [name] Pilling RN, BSN Entered By: [name] [name] on 10/26/2021 09:46:21 -------------------------------------------------------------------------------- Multi Wound Chart Details Patient Name: Date of Service: [name] 10/26/2021 9:15 A M Medical Record Number: [id number] Patient Account Number: [id number] Date of Birth/Sex: Treating RN: [birth year] (60 y.o. [name] [name], [name] [name] Primary Care Sohum Delillo: [name] [name] Other Clinician: Referring Jazzmine Kleiman: Treating Dallon Dacosta/Extender: [name] [name] in Treatment: 4 Vital Signs Height(in): 71 Pulse(bpm): 80 Weight(lbs): 216 Blood Pressure(mmHg): 180/100 Body Mass Index(BMI): 30 Temperature(F): 97.8 Respiratory Rate(breaths/min): 20 Photos: Left, Anterior Lower Leg Left, Medial Lower Leg Right, Anterior Lower Leg Wound Location: Gradually Appeared Gradually Appeared Gradually Appeared Wounding Event: Venous Leg Ulcer Venous Leg Ulcer Venous Leg Ulcer Primary Etiology: Chronic Obstructive Pulmonary Chronic Obstructive Pulmonary Chronic Obstructive Pulmonary Comorbid History: Disease (COPD), Deep Vein Disease (COPD), Deep Vein Disease (COPD), Deep Vein Thrombosis, Hypertension, Peripheral Thrombosis, Hypertension, Peripheral Thrombosis, Hypertension, Peripheral Venous Disease, Neuropathy Venous Disease, Neuropathy Venous Disease, Neuropathy 09/14/2021 09/14/2021 09/14/2021 Date Acquired: _0 [name] of Treatment: Open Open Open Wound Status: 3x2.5x0.1 0.2x0.2x0.1 0.8x0.4x0.1 Measurements L x W x D (cm) 5.89 0.031 0.251 A (cm) : rea 0.589 0.003 0.025 Volume (cm) : 8.10% 94.00% 50.90% % Reduction in A rea: 8.10% 94.20% 51.00% % Reduction in Volume: Full Thickness Without Exposed Full Thickness Without Exposed Full Thickness Without Exposed Classification: Support Structures Support Structures Support Structures Medium Medium Medium Exudate A mount: Serosanguineous Serosanguineous Serosanguineous Exudate Type: red, brown red, brown red, brown Exudate Color: Flat and Intact Flat and Intact Flat and Intact Wound Margin: Medium (34-66%) Large (67-100%) Medium (34-66%) Granulation A mount: Pink Pink Pink Granulation Quality: Medium (34-66%) None Present (0%) Medium (34-66%) Necrotic A mount: Fat Layer (Subcutaneous Tissue): Yes Fat Layer (Subcutaneous Tissue): Yes Fat Layer (Subcutaneous Tissue): Yes Exposed Structures: Fascia: No Fascia: No Fascia: No Tendon: No Tendon: No Tendon: No Muscle: No Muscle: No Muscle: No Joint: No Joint: No Joint: No Bone: No Bone: No Bone: No Small (1-33%) Small (1-33%) Small (1-33%) Epithelialization: Debridement - Excisional Debridement - Excisional Debridement - Excisional Debridement: Pre-procedure Verification/Time Out 10:00 10:00 10:00 Taken: Other Other  Other Pain Control: Subcutaneous, Slough Subcutaneous, [employer], [employer] Tissue Debrided: Skin/Subcutaneous Tissue Skin/Subcutaneous Tissue Skin/Subcutaneous Tissue Level: 7.5 0.04 0.32 Debridement A (sq cm): rea Curette Curette Curette Instrument: Minimum Minimum Minimum Bleeding: Pressure Pressure Pressure Hemostasis A chieved: 0 0 0 Procedural Pain: 0 0 0 Post Procedural Pain: Procedure was tolerated well Procedure was tolerated well Procedure was tolerated well Debridement Treatment Response: 3x2.5x0.1 0.2x0.2x0.1 0.8x0.4x0.1 Post  Debridement Measurements L x W x D (cm) 0.589 0.003 0.025 Post Debridement Volume: (cm) Compression Therapy Compression Therapy Compression Therapy Procedures Performed: Debridement Debridement Debridement Treatment Notes Wound #1 (Lower Leg) Wound Laterality: Left, Anterior Cleanser Soap and Water Discharge Instruction: May shower and wash wound with dial antibacterial soap and water prior to dressing change. Wound Cleanser Discharge Instruction: Cleanse the wound with wound cleanser prior to applying a clean dressing using gauze sponges, not tissue or cotton balls. Peri-Wound Care Triamcinolone 15 (g) Discharge Instruction: Use triamcinolone 15 (g) as directed Zinc Oxide Ointment 30g tube Discharge Instruction: Apply Zinc Oxide to periwound with each dressing change Sween Lotion (Moisturizing lotion) Discharge Instruction: Apply moisturizing lotion as directed Topical Primary Dressing Maxorb Extra Calcium Alginate 2x2 in Discharge Instruction: Apply calcium alginate to wound bed as instructed Cutimed Sorbact Swab Discharge Instruction: Apply to wound bed as instructed Secondary Dressing ABD Pad, 8x10 Discharge Instruction: Apply over primary dressing as directed. Secured With Compression Wrap ThreePress (3 layer compression wrap) Discharge Instruction: Apply three layer compression as directed. Compression Stockings Add-Ons Wound #2 (Lower Leg) Wound Laterality: Left, Medial Cleanser Soap and Water Discharge Instruction: May shower and wash wound with dial antibacterial soap and water prior to dressing change. Wound Cleanser Discharge Instruction: Cleanse the wound with wound cleanser prior to applying a clean dressing using gauze sponges, not tissue or cotton balls. Peri-Wound Care Triamcinolone 15 (g) Discharge Instruction: Use triamcinolone 15 (g) as directed Zinc Oxide Ointment 30g tube Discharge Instruction: Apply Zinc Oxide to periwound with each dressing  change Sween Lotion (Moisturizing lotion) Discharge Instruction: Apply moisturizing lotion as directed Topical Primary Dressing Maxorb Extra Calcium Alginate 2x2 in Discharge Instruction: Apply calcium alginate to wound bed as instructed Cutimed Sorbact Swab Discharge Instruction: Apply to wound bed as instructed Secondary Dressing ABD Pad, 8x10 Discharge Instruction: Apply over primary dressing as directed. Secured With Compression Wrap ThreePress (3 layer compression wrap) Discharge Instruction: Apply three layer compression as directed. Compression Stockings Add-Ons Wound #3 (Lower Leg) Wound Laterality: Right, Anterior Cleanser Soap and Water Discharge Instruction: May shower and wash wound with dial antibacterial soap and water prior to dressing change. Wound Cleanser Discharge Instruction: Cleanse the wound with wound cleanser prior to applying a clean dressing using gauze sponges, not tissue or cotton balls. Peri-Wound Care Triamcinolone 15 (g) Discharge Instruction: Use triamcinolone 15 (g) as directed Zinc Oxide Ointment 30g tube Discharge Instruction: Apply Zinc Oxide to periwound with each dressing change Sween Lotion (Moisturizing lotion) Discharge Instruction: Apply moisturizing lotion as directed Topical Primary Dressing Maxorb Extra Calcium Alginate 2x2 in Discharge Instruction: Apply calcium alginate to wound bed as instructed Cutimed Sorbact Swab Discharge Instruction: Apply to wound bed as instructed Secondary Dressing ABD Pad, 8x10 Discharge Instruction: Apply over primary dressing as directed. Secured With Compression Wrap ThreePress (3 layer compression wrap) Discharge Instruction: Apply three layer compression as directed. Compression Stockings Add-Ons Electronic Signature(s) Signed: 10/26/2021 10:26:40 AM By: Kalman Shan DO Signed: 10/26/2021 5:06:43 PM By: [name] Pilling RN, BSN Entered By: Kalman Shan on 10/26/2021  10:22:29 -------------------------------------------------------------------------------- Multi-Disciplinary Care Plan Details  Patient Name: Date of Service: [name] [name] 10/26/2021 9:15 A M Medical Record Number: [id number] Patient Account Number: [id number] Date of Birth/Sex: Treating RN: [name] (60 y.o. [name] [name], Tammi Klippel Primary Care Lucelia Lacey: [name] [name] Other Clinician: Referring Marye Eagen: Treating Regina Coppolino/Extender: [name] [name] in Treatment: 4 Active Inactive Nutrition Nursing Diagnoses: Potential for alteratiion in Nutrition/Potential for imbalanced nutrition Goals: Patient/caregiver agrees to and verbalizes understanding of need to obtain nutritional consultation Date Initiated: 09/28/2021 Target Resolution Date: 11/05/2021 Goal Status: Active Interventions: Assess patient nutrition upon admission and as needed per policy Provide education on nutrition Treatment Activities: Education provided on Nutrition : 10/08/2021 Patient referred to Primary Care Physician for further nutritional evaluation : 09/28/2021 Notes: Pain, Acute or Chronic Nursing Diagnoses: Pain, acute or chronic: actual or potential Potential alteration in comfort, pain Goals: Patient will verbalize adequate pain control and receive pain control interventions during procedures as needed Date Initiated: 09/28/2021 Target Resolution Date: 11/05/2021 Goal Status: Active Patient/caregiver will verbalize comfort level met Date Initiated: 09/28/2021 Target Resolution Date: 11/04/2021 Goal Status: Active Interventions: Encourage patient to take pain medications as prescribed Provide education on pain management Reposition patient for comfort Treatment Activities: Administer pain control measures as ordered : 09/28/2021 Notes: Wound/Skin Impairment Nursing Diagnoses: Knowledge deficit related to ulceration/compromised skin integrity Goals: Patient/caregiver  will verbalize understanding of skin care regimen Date Initiated: 09/28/2021 Target Resolution Date: 11/05/2021 Goal Status: Active Interventions: Assess patient/caregiver ability to perform ulcer/skin care regimen upon admission and as needed Assess ulceration(s) every visit Provide education on smoking Provide education on ulcer and skin care Treatment Activities: Skin care regimen initiated : 09/28/2021 Smoking cessation education : 09/28/2021 Topical wound management initiated : 09/28/2021 Notes: Electronic Signature(s) Signed: 10/26/2021 5:06:43 PM By: [name] Pilling RN, BSN Entered By: [name] [name] on 10/26/2021 10:02:33 -------------------------------------------------------------------------------- Pain Assessment Details Patient Name: Date of Service: [name] 10/26/2021 9:15 A M Medical Record Number: [id number] Patient Account Number: [id number] Date of Birth/Sex: Treating RN: [name] (60 y.o. [name] [name] Primary Care Khai Torbert: [name] [name] Other Clinician: Referring Franciszek Platten: Treating Hawken Bielby/Extender: [name] [name] in Treatment: 4 Active Problems Location of Pain Severity and Description of Pain Patient Has Paino Yes Site Locations Pain Location: Pain Location: Generalized Pain Rate the pain. Current Pain Level: 10 Worst Pain Level: 10 Least Pain Level: 0 Tolerable Pain Level: 8 Character of Pain Describe the Pain: Heavy, Sharp, Shooting, Tender Pain Management and Medication Current Pain Management: Medication: No Cold Application: No Rest: No Massage: No Activity: No T.E.N.S.: No Heat Application: No Leg drop or elevation: No Is the Current Pain Management Adequate: Inadequate How does your wound impact your activities of daily livingo Sleep: No Bathing: No Appetite: No Relationship With Others: No Bladder Continence: No Emotions: No Bowel Continence: No Work: No Toileting: No Drive:  No Dressing: No Hobbies: No Notes Explained to patient to go to ED, urgent care or PCP for evaluation from falling r/t pain. Electronic Signature(s) Signed: 10/26/2021 5:06:43 PM By: [name] Pilling RN, BSN Entered By: [name] [name] on 10/26/2021 09:45:14 -------------------------------------------------------------------------------- Patient/Caregiver Education Details Patient Name: Date of Service: [name] 12/20/2022andnbsp9:15 A M Medical Record Number: [id number] Patient Account Number: [id number] Date of Birth/Gender: Treating RN: [birth year][name] (60 y.o. [name] [name] Primary Care Physician: [name] [name] Other Clinician: Referring Physician: Treating Physician/Extender: [name] [name] in Treatment: 4 Education Assessment Education Provided To: Patient Education Topics Provided Wound/Skin Impairment: Handouts: Skin Care Do's and Dont's Methods: Explain/Verbal Responses:  Reinforcements needed Electronic Signature(s) Signed: 10/26/2021 5:06:43 PM By: [name] Pilling RN, BSN Entered By: [name] [name] on 10/26/2021 10:03:13 -------------------------------------------------------------------------------- Wound Assessment Details Patient Name: Date of Service: [name] [name] 10/26/2021 9:15 A M Medical Record Number: [id number] Patient Account Number: [id number] Date of Birth/Sex: Treating RN: [birth year] [name] (60 y.o. [name] [name] Primary Care Fiza Nation: [name] [name] Other Clinician: Referring Tamella Tuccillo: Treating Brodyn Depuy/Extender: [name] [name] in Treatment: 4 Wound Status Wound Number: 1 Primary Venous Leg Ulcer Etiology: Wound Location: Left, Anterior Lower Leg Wound Open Wounding Event: Gradually Appeared Status: Date Acquired: 09/14/2021 Comorbid Chronic Obstructive Pulmonary Disease (COPD), Deep Vein [name] Of Treatment: 4 History: Thrombosis, Hypertension, Peripheral Venous Disease,  Neuropathy Clustered Wound: No Photos Wound Measurements Length: (cm) 3 Width: (cm) 2.5 Depth: (cm) 0.1 Area: (cm) 5.89 Volume: (cm) 0.589 % Reduction in Area: 8.1% % Reduction in Volume: 8.1% Epithelialization: Small (1-33%) Tunneling: No Undermining: No Wound Description Classification: Full Thickness Without Exposed Support Structures Wound Margin: Flat and Intact Exudate Amount: Medium Exudate Type: Serosanguineous Exudate Color: red, brown Foul Odor After Cleansing: No Slough/Fibrino Yes Wound Bed Granulation Amount: Medium (34-66%) Exposed Structure Granulation Quality: Pink Fascia Exposed: No Necrotic Amount: Medium (34-66%) Fat Layer (Subcutaneous Tissue) Exposed: Yes Necrotic Quality: Adherent Slough Tendon Exposed: No Muscle Exposed: No Joint Exposed: No Bone Exposed: No Treatment Notes Wound #1 (Lower Leg) Wound Laterality: Left, Anterior Cleanser Soap and Water Discharge Instruction: May shower and wash wound with dial antibacterial soap and water prior to dressing change. Wound Cleanser Discharge Instruction: Cleanse the wound with wound cleanser prior to applying a clean dressing using gauze sponges, not tissue or cotton balls. Peri-Wound Care Triamcinolone 15 (g) Discharge Instruction: Use triamcinolone 15 (g) as directed Zinc Oxide Ointment 30g tube Discharge Instruction: Apply Zinc Oxide to periwound with each dressing change Sween Lotion (Moisturizing lotion) Discharge Instruction: Apply moisturizing lotion as directed Topical Primary Dressing Maxorb Extra Calcium Alginate 2x2 in Discharge Instruction: Apply calcium alginate to wound bed as instructed Cutimed Sorbact Swab Discharge Instruction: Apply to wound bed as instructed Secondary Dressing ABD Pad, 8x10 Discharge Instruction: Apply over primary dressing as directed. Secured With Compression Wrap ThreePress (3 layer compression wrap) Discharge Instruction: Apply three layer  compression as directed. Compression Stockings Add-Ons Electronic Signature(s) Signed: 10/26/2021 5:06:43 PM By: [name] Pilling RN, BSN Entered By: [name] [name] on 10/26/2021 09:47:11 -------------------------------------------------------------------------------- Wound Assessment Details Patient Name: Date of Service: [name] 10/26/2021 9:15 A M Medical Record Number: [id number] Patient Account Number: [id number] Date of Birth/Sex: Treating RN: [birth year][name] (60 y.o. [name] [name] Primary Care Kambria Grima: [name] [name] Other Clinician: Referring Teriana Danker: Treating Barrie Sigmund/Extender: [name] [name] in Treatment: 4 Wound Status Wound Number: 2 Primary Venous Leg Ulcer Etiology: Wound Location: Left, Medial Lower Leg Wound Open Wounding Event: Gradually Appeared Status: Date Acquired: 09/14/2021 Comorbid Chronic Obstructive Pulmonary Disease (COPD), Deep Vein [name] Of Treatment: 4 History: Thrombosis, Hypertension, Peripheral Venous Disease, Neuropathy Clustered Wound: No Photos Wound Measurements Length: (cm) 0.2 Width: (cm) 0.2 Depth: (cm) 0.1 Area: (cm) 0.031 Volume: (cm) 0.003 % Reduction in Area: 94% % Reduction in Volume: 94.2% Epithelialization: Small (1-33%) Tunneling: No Undermining: No Wound Description Classification: Full Thickness Without Exposed Support Structures Wound Margin: Flat and Intact Exudate Amount: Medium Exudate Type: Serosanguineous Exudate Color: red, brown Foul Odor After Cleansing: No Slough/Fibrino No Wound Bed Granulation Amount: Large (67-100%) Exposed Structure Granulation Quality: Pink Fascia Exposed: No Necrotic Amount: None Present (0%) Fat Layer (Subcutaneous Tissue) Exposed:  Yes Tendon Exposed: No Muscle Exposed: No Joint Exposed: No Bone Exposed: No Treatment Notes Wound #2 (Lower Leg) Wound Laterality: Left, Medial Cleanser Soap and Water Discharge Instruction: May shower and  wash wound with dial antibacterial soap and water prior to dressing change. Wound Cleanser Discharge Instruction: Cleanse the wound with wound cleanser prior to applying a clean dressing using gauze sponges, not tissue or cotton balls. Peri-Wound Care Triamcinolone 15 (g) Discharge Instruction: Use triamcinolone 15 (g) as directed Zinc Oxide Ointment 30g tube Discharge Instruction: Apply Zinc Oxide to periwound with each dressing change Sween Lotion (Moisturizing lotion) Discharge Instruction: Apply moisturizing lotion as directed Topical Primary Dressing Maxorb Extra Calcium Alginate 2x2 in Discharge Instruction: Apply calcium alginate to wound bed as instructed Cutimed Sorbact Swab Discharge Instruction: Apply to wound bed as instructed Secondary Dressing ABD Pad, 8x10 Discharge Instruction: Apply over primary dressing as directed. Secured With Compression Wrap ThreePress (3 layer compression wrap) Discharge Instruction: Apply three layer compression as directed. Compression Stockings Add-Ons Electronic Signature(s) Signed: 10/26/2021 5:06:43 PM By: [name] Pilling RN, BSN Entered By: [name] [name] on 10/26/2021 09:47:34 -------------------------------------------------------------------------------- Wound Assessment Details Patient Name: Date of Service: [name] 10/26/2021 9:15 A M Medical Record Number: [id number] Patient Account Number: [id number] Date of Birth/Sex: Treating RN: [birth year] [name] (60 y.o. [name] [name] Primary Care Annalyssa Thune: [name] [name] Other Clinician: Referring Nozomi Mettler: Treating Shalise Rosado/Extender: [name] [name] in Treatment: 4 Wound Status Wound Number: 3 Primary Venous Leg Ulcer Etiology: Wound Location: Right, Anterior Lower Leg Wound Open Wounding Event: Gradually Appeared Status: Date Acquired: 09/14/2021 Comorbid Chronic Obstructive Pulmonary Disease (COPD), Deep Vein [name] Of Treatment: 4 History:  Thrombosis, Hypertension, Peripheral Venous Disease, Neuropathy Clustered Wound: No Photos Wound Measurements Length: (cm) 0.8 Width: (cm) 0.4 Depth: (cm) 0.1 Area: (cm) 0.251 Volume: (cm) 0.025 % Reduction in Area: 50.9% % Reduction in Volume: 51% Epithelialization: Small (1-33%) Tunneling: No Undermining: No Wound Description Classification: Full Thickness Without Exposed Support Structures Wound Margin: Flat and Intact Exudate Amount: Medium Exudate Type: Serosanguineous Exudate Color: red, brown Foul Odor After Cleansing: No Slough/Fibrino Yes Wound Bed Granulation Amount: Medium (34-66%) Exposed Structure Granulation Quality: Pink Fascia Exposed: No Necrotic Amount: Medium (34-66%) Fat Layer (Subcutaneous Tissue) Exposed: Yes Necrotic Quality: Adherent Slough Tendon Exposed: No Muscle Exposed: No Joint Exposed: No Bone Exposed: No Treatment Notes Wound #3 (Lower Leg) Wound Laterality: Right, Anterior Cleanser Soap and Water Discharge Instruction: May shower and wash wound with dial antibacterial soap and water prior to dressing change. Wound Cleanser Discharge Instruction: Cleanse the wound with wound cleanser prior to applying a clean dressing using gauze sponges, not tissue or cotton balls. Peri-Wound Care Triamcinolone 15 (g) Discharge Instruction: Use triamcinolone 15 (g) as directed Zinc Oxide Ointment 30g tube Discharge Instruction: Apply Zinc Oxide to periwound with each dressing change Sween Lotion (Moisturizing lotion) Discharge Instruction: Apply moisturizing lotion as directed Topical Primary Dressing Maxorb Extra Calcium Alginate 2x2 in Discharge Instruction: Apply calcium alginate to wound bed as instructed Cutimed Sorbact Swab Discharge Instruction: Apply to wound bed as instructed Secondary Dressing ABD Pad, 8x10 Discharge Instruction: Apply over primary dressing as directed. Secured With Compression Wrap ThreePress (3 layer compression  wrap) Discharge Instruction: Apply three layer compression as directed. Compression Stockings Add-Ons Electronic Signature(s) Signed: 10/26/2021 5:06:43 PM By: [name] Pilling RN, BSN Entered By: [name] [name] on 10/26/2021 09:47:58 -------------------------------------------------------------------------------- Vitals Details Patient Name: Date of Service: [name] 10/26/2021 9:15 A M Medical Record Number: [id number] Patient Account Number:  [id number] Date of Birth/Sex: Treating RN: [birth year]-[name] (60 y.o. [name] [name] Primary Care Kanchan Gal: [name] [name] Other Clinician: Referring Averee Harb: Treating Leaira Fullam/Extender: [name] [name] in Treatment: 4 Vital Signs Time Taken: 09:35 Temperature (F): 97.8 Height (in): 71 Pulse (bpm): 80 Weight (lbs): 216 Respiratory Rate (breaths/min): 20 Body Mass Index (BMI): 30.1 Blood Pressure (mmHg): 180/100 Reference Range: 80 - 120 mg / dl Notes Per patient in severe pain with back and neck from falling and has not taken BP medication. Electronic Signature(s) Signed: 10/26/2021 5:06:43 PM By: [name] Pilling RN, BSN Entered By: [name] [name] on 10/26/2021 09:44:30

## 2021-10-26 NOTE — Progress Notes (Signed)
[name], [name] ([id number]) Visit Report for 10/26/2021 Chief Complaint Document Details Patient Name: Date of Service: [name] [name] 10/26/2021 9:15 A M Medical Record Number: [id number] Patient Account Number: [id number] Date of Birth/Sex: Treating RN: [birth year] (60 y.o. [name] [name] Primary Care Provider: Fonnie [name] Other Clinician: Referring Provider: Treating Provider/Extender: [name] [name] in Treatment: 4 Information Obtained from: Patient Chief Complaint Bilateral lower extremity wounds Electronic Signature(s) Signed: 10/26/2021 10:26:40 AM By: Kalman Shan DO Entered By: Kalman Shan on 10/26/2021 10:22:45 -------------------------------------------------------------------------------- Debridement Details Patient Name: Date of Service: [name] [name] 10/26/2021 9:15 A M Medical Record Number: [id number] Patient Account Number: [id number] Date of Birth/Sex: Treating RN: February 12, 1961 (60 y.o. [name] [name], [name] [name] Primary Care Provider: Fonnie [name] Other Clinician: Referring Provider: Treating Provider/Extender: [name] [name] in Treatment: 4 Debridement Performed for Assessment: Wound #1 Left,Anterior Lower Leg Performed By: Physician Kalman Shan, DO Debridement Type: Debridement Severity of Tissue Pre Debridement: Fat layer exposed Level of Consciousness (Pre-procedure): Awake and Alert Pre-procedure Verification/Time Out Yes - 10:00 Taken: Start Time: 10:01 Pain Control: Other : Benzocaine 20% T Area Debrided (L x W): otal 3 (cm) x 2.5 (cm) = 7.5 (cm) Tissue and other material debrided: Viable, Non-Viable, Slough, Subcutaneous, Skin: Dermis , Skin: Epidermis, Fibrin/Exudate, Slough Level: Skin/Subcutaneous Tissue Debridement Description: Excisional Instrument: Curette Bleeding: Minimum Hemostasis Achieved: Pressure End Time: 10:04 Procedural Pain: 0 Post Procedural Pain: 0 Response to  Treatment: Procedure was tolerated well Level of Consciousness (Post- Awake and Alert procedure): Post Debridement Measurements of Total Wound Length: (cm) 3 Width: (cm) 2.5 Depth: (cm) 0.1 Volume: (cm) 0.589 Character of Wound/Ulcer Post Debridement: Requires Further Debridement Severity of Tissue Post Debridement: Fat layer exposed Post Procedure Diagnosis Same as Pre-procedure Electronic Signature(s) Signed: 10/26/2021 10:26:40 AM By: Kalman Shan DO Signed: 10/26/2021 5:06:43 PM By: [name] Pilling RN, BSN Entered By: [name] [name] on 10/26/2021 10:04:51 -------------------------------------------------------------------------------- Debridement Details Patient Name: Date of Service: [name] [name] 10/26/2021 9:15 A M Medical Record Number: [id number] Patient Account Number: [id number] Date of Birth/Sex: Treating RN: 11/13/1960 (60 y.o. [name] [name], [name] [name] Primary Care Provider: Fonnie [name] Other Clinician: Referring Provider: Treating Provider/Extender: [name] [name] in Treatment: 4 Debridement Performed for Assessment: Wound #2 Left,Medial Lower Leg Performed By: Physician Kalman Shan, DO Debridement Type: Debridement Severity of Tissue Pre Debridement: Limited to breakdown of skin Level of Consciousness (Pre-procedure): Awake and Alert Pre-procedure Verification/Time Out Yes - 10:00 Taken: Start Time: 10:01 Pain Control: Other : Benzocaine 20% T Area Debrided (L x W): otal 0.2 (cm) x 0.2 (cm) = 0.04 (cm) Tissue and other material debrided: Viable, Non-Viable, Slough, Subcutaneous, Skin: Dermis , Skin: Epidermis, Fibrin/Exudate, Slough Level: Skin/Subcutaneous Tissue Debridement Description: Excisional Instrument: Curette Bleeding: Minimum Hemostasis Achieved: Pressure End Time: 10:04 Procedural Pain: 0 Post Procedural Pain: 0 Response to Treatment: Procedure was tolerated well Level of Consciousness (Post- Awake and  Alert procedure): Post Debridement Measurements of Total Wound Length: (cm) 0.2 Width: (cm) 0.2 Depth: (cm) 0.1 Volume: (cm) 0.003 Character of Wound/Ulcer Post Debridement: Requires Further Debridement Severity of Tissue Post Debridement: Limited to breakdown of skin Post Procedure Diagnosis Same as Pre-procedure Electronic Signature(s) Signed: 10/26/2021 10:26:40 AM By: Kalman Shan DO Signed: 10/26/2021 5:06:43 PM By: [name] Pilling RN, BSN Entered By: [name] [name] on 10/26/2021 10:05:32 -------------------------------------------------------------------------------- Debridement Details Patient Name: Date of Service: [name] [name] 10/26/2021 9:15 A M Medical Record Number: [id number] Patient Account Number: [id number] Date of Birth/Sex: Treating RN:  Mar 23, 1961 (60 y.o. [name] [name], [name] [name] Primary Care Provider: Fonnie [name] Other Clinician: Referring Provider: Treating Provider/Extender: [name] [name] in Treatment: 4 Debridement Performed for Assessment: Wound #3 Right,Anterior Lower Leg Performed By: Physician Kalman Shan, DO Debridement Type: Debridement Severity of Tissue Pre Debridement: Fat layer exposed Level of Consciousness (Pre-procedure): Awake and Alert Pre-procedure Verification/Time Out Yes - 10:00 Taken: Start Time: 10:01 Pain Control: Other : Benzocaine 20% T Area Debrided (L x W): otal 0.8 (cm) x 0.4 (cm) = 0.32 (cm) Tissue and other material debrided: Viable, Non-Viable, Slough, Subcutaneous, Skin: Dermis , Skin: Epidermis, Fibrin/Exudate, Slough Level: Skin/Subcutaneous Tissue Debridement Description: Excisional Instrument: Curette Bleeding: Minimum Hemostasis Achieved: Pressure End Time: 10:04 Procedural Pain: 0 Post Procedural Pain: 0 Response to Treatment: Procedure was tolerated well Level of Consciousness (Post- Awake and Alert procedure): Post Debridement Measurements of Total Wound Length: (cm)  0.8 Width: (cm) 0.4 Depth: (cm) 0.1 Volume: (cm) 0.025 Character of Wound/Ulcer Post Debridement: Requires Further Debridement Severity of Tissue Post Debridement: Fat layer exposed Post Procedure Diagnosis Same as Pre-procedure Electronic Signature(s) Signed: 10/26/2021 10:26:40 AM By: Kalman Shan DO Signed: 10/26/2021 5:06:43 PM By: [name] Pilling RN, BSN Entered By: [name] [name] on 10/26/2021 10:05:58 -------------------------------------------------------------------------------- HPI Details Patient Name: Date of Service: [name] [name] 10/26/2021 9:15 A M Medical Record Number: [id number] Patient Account Number: [id number] Date of Birth/Sex: Treating RN: September 08, 1961 (60 y.o. [name] [name] Primary Care Provider: Fonnie [name] Other Clinician: Referring Provider: Treating Provider/Extender: [name] [name] in Treatment: 4 History of Present Illness HPI Description: Admission 09/28/2021 Mr. 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 He states the wounds have been present for the past 6 months and wax and wane in healing. He has been seen by vein and vascular for this issue. He has been treated with Unna boots with benefit. He states he started compression stockings when there was 1 wound remaining and now has developed multiple wounds. He currently takes 20 mg of Lasix and has been doing so for years. He states he has been on several rounds of antibiotics for this issue. He last took Bactrim and completed this a couple days ago. He currently denies signs of infection. 11/29; patient presents for 1 week follow-up. He states that 4 days ago he felt burning to his legs bilaterally and removed the compression wraps. He saw vein and vascular yesterday and was placed in  an Unna boot to the left leg. T oday He reports increased redness to the left leg. 12/6; patient presents for follow-up. He has no issues or complaints today. He completed his antibiotics and reports improvement in symptoms. At the last nurse visit since he had done well with Kerlix and Coban he was increased to 3 layer compression wrap. Over the past few days he has tolerated this well. He currently denies signs of infection. 12/13; patient presents for follow-up. He has no issues or complaints today. He has tolerated the 3 layer compression wrap well. He denies signs of infection. 12/20; patient presents for follow-up. He took the compression wraps off yesterday. He wanted to shower. He denies signs of infection. Electronic Signature(s) Signed: 10/26/2021 10:26:40 AM By: Kalman Shan DO Entered By: Kalman Shan on 10/26/2021 10:23:21 -------------------------------------------------------------------------------- Physical Exam Details Patient Name: Date of Service: DA Y, DA [name] 10/26/2021 9:15 A  M Medical Record Number: [id number] Patient Account Number: [id number] Date of Birth/Sex: Treating RN: September 20, 1961 (60 y.o. [name] [name] Primary Care Provider: Fonnie [name] Other Clinician: Referring Provider: Treating Provider/Extender: [name] [name] in Treatment: 4 Constitutional respirations regular, non-labored and within target range for patient.. Cardiovascular 2+ dorsalis pedis/posterior tibialis pulses. Psychiatric pleasant and cooperative. Notes Bilateral lower extremity wounds limited to skin breakdown with nonviable tissue in the wound bed. 2+ pitting edema. Varicose veins noted. Venous stasis dermatitis bilaterally. Electronic Signature(s) Signed: 10/26/2021 10:26:40 AM By: Kalman Shan DO Entered By: Kalman Shan on 10/26/2021 10:24:20 -------------------------------------------------------------------------------- Physician  Orders Details Patient Name: Date of Service: [name] [name] 10/26/2021 9:15 A M Medical Record Number: [id number] Patient Account Number: [id number] Date of Birth/Sex: Treating RN: [birth year] (60 y.o. [name] [name] Primary Care Provider: Fonnie [name] Other Clinician: Referring Provider: Treating Provider/Extender: [name] [name] in Treatment: 4 Verbal / Phone Orders: No Diagnosis Coding Follow-up Appointments ppointment in 2 weeks. - Dr. Heber [place] Tuesday 11/09/2021 Return A Nurse Visit: - Tuesday 11/02/2021 Other: - ****Patient to go to the ED, urgent care or call Primary care provider related to falling, back pain, and to be evaluated.*** Bathing/ Shower/ Hygiene May shower with protection but do not get wound dressing(s) wet. - ***Do not remove the wraps.*** At appt times will remove wraps and wash legs.*** Edema Control - Lymphedema / SCD / Other Elevate legs to the level of the heart or above for 30 minutes daily and/or when sitting, a frequency of: - 3-4 times a [place] throughout the [place]. Avoid standing for long periods of time. Exercise regularly Additional Orders / Instructions Stop/Decrease Smoking Wound Treatment Wound #1 - Lower Leg Wound Laterality: Left, Anterior Cleanser: Soap and Water 1 x Per Week Discharge Instructions: May shower and wash wound with dial antibacterial soap and water prior to dressing change. Cleanser: Wound Cleanser 1 x Per Week Discharge Instructions: Cleanse the wound with wound cleanser prior to applying a clean dressing using gauze sponges, not tissue or cotton balls. Peri-Wound Care: Triamcinolone 15 (g) 1 x Per Week Discharge Instructions: Use triamcinolone 15 (g) as directed Peri-Wound Care: Zinc Oxide Ointment 30g tube 1 x Per Week Discharge Instructions: Apply Zinc Oxide to periwound with each dressing change Peri-Wound Care: Sween Lotion (Moisturizing lotion) 1 x Per Week Discharge Instructions: Apply  moisturizing lotion as directed Prim Dressing: Maxorb Extra Calcium Alginate 2x2 in 1 x Per Week ary Discharge Instructions: Apply calcium alginate to wound bed as instructed Prim Dressing: Cutimed Sorbact Swab 1 x Per Week ary Discharge Instructions: Apply to wound bed as instructed Secondary Dressing: ABD Pad, 8x10 1 x Per Week Discharge Instructions: Apply over primary dressing as directed. Compression Wrap: ThreePress (3 layer compression wrap) 1 x Per Week Discharge Instructions: Apply three layer compression as directed. Wound #2 - Lower Leg Wound Laterality: Left, Medial Cleanser: Soap and Water 1 x Per Week Discharge Instructions: May shower and wash wound with dial antibacterial soap and water prior to dressing change. Cleanser: Wound Cleanser 1 x Per Week Discharge Instructions: Cleanse the wound with wound cleanser prior to applying a clean dressing using gauze sponges, not tissue or cotton balls. Peri-Wound Care: Triamcinolone 15 (g) 1 x Per Week Discharge Instructions: Use triamcinolone 15 (g) as directed Peri-Wound Care: Zinc Oxide Ointment 30g tube 1 x Per Week Discharge Instructions: Apply Zinc Oxide to periwound with each dressing change Peri-Wound Care: Sween Lotion (Moisturizing lotion) 1 x Per Week Discharge Instructions:  Apply moisturizing lotion as directed Prim Dressing: Maxorb Extra Calcium Alginate 2x2 in 1 x Per Week ary Discharge Instructions: Apply calcium alginate to wound bed as instructed Prim Dressing: Cutimed Sorbact Swab 1 x Per Week ary Discharge Instructions: Apply to wound bed as instructed Secondary Dressing: ABD Pad, 8x10 1 x Per Week Discharge Instructions: Apply over primary dressing as directed. Compression Wrap: ThreePress (3 layer compression wrap) 1 x Per Week Discharge Instructions: Apply three layer compression as directed. Wound #3 - Lower Leg Wound Laterality: Right, Anterior Cleanser: Soap and Water 1 x Per Week Discharge  Instructions: May shower and wash wound with dial antibacterial soap and water prior to dressing change. Cleanser: Wound Cleanser 1 x Per Week Discharge Instructions: Cleanse the wound with wound cleanser prior to applying a clean dressing using gauze sponges, not tissue or cotton balls. Peri-Wound Care: Triamcinolone 15 (g) 1 x Per Week Discharge Instructions: Use triamcinolone 15 (g) as directed Peri-Wound Care: Zinc Oxide Ointment 30g tube 1 x Per Week Discharge Instructions: Apply Zinc Oxide to periwound with each dressing change Peri-Wound Care: Sween Lotion (Moisturizing lotion) 1 x Per Week Discharge Instructions: Apply moisturizing lotion as directed Prim Dressing: Maxorb Extra Calcium Alginate 2x2 in 1 x Per Week ary Discharge Instructions: Apply calcium alginate to wound bed as instructed Prim Dressing: Cutimed Sorbact Swab 1 x Per Week ary Discharge Instructions: Apply to wound bed as instructed Secondary Dressing: ABD Pad, 8x10 1 x Per Week Discharge Instructions: Apply over primary dressing as directed. Compression Wrap: ThreePress (3 layer compression wrap) 1 x Per Week Discharge Instructions: Apply three layer compression as directed. Electronic Signature(s) Signed: 10/26/2021 10:26:40 AM By: Kalman Shan DO Signed: 10/26/2021 5:06:43 PM By: [name] Pilling RN, BSN Entered By: [name] [name] on 10/26/2021 10:02:21 -------------------------------------------------------------------------------- Problem List Details Patient Name: Date of Service: [name] [name] 10/26/2021 9:15 A M Medical Record Number: [id number] Patient Account Number: [id number] Date of Birth/Sex: Treating RN: Jul 26, 1961 (60 y.o. [name] [name], [name] [name] Primary Care Provider: Fonnie [name] Other Clinician: Referring Provider: Treating Provider/Extender: [name] [name] in Treatment: 4 Active Problems ICD-10 Encounter Code Description Active Date MDM Diagnosis I87.313  Chronic venous hypertension (idiopathic) with ulcer of bilateral lower extremity 09/28/2021 No Yes L97.812 Non-pressure chronic ulcer of other part of right lower leg with fat layer 09/28/2021 No Yes exposed L97.822 Non-pressure chronic ulcer of other part of left lower leg with fat layer exposed11/22/2022 No Yes F17.210 Nicotine dependence, cigarettes, uncomplicated 77/93/9030 No Yes I10 Essential (primary) hypertension 09/28/2021 No Yes J44.9 Chronic obstructive pulmonary disease, unspecified 09/28/2021 No Yes Inactive Problems Resolved Problems Electronic Signature(s) Signed: 10/26/2021 10:26:40 AM By: Kalman Shan DO Entered By: Kalman Shan on 10/26/2021 10:22:12 -------------------------------------------------------------------------------- Progress Note Details Patient Name: Date of Service: DA Steele Berg [name] 10/26/2021 9:15 A M Medical Record Number: [id number] Patient Account Number: [id number] Date of Birth/Sex: Treating RN: Jan 23, 1961 (60 y.o. [name] [name] Primary Care Provider: Fonnie [name] Other Clinician: Referring Provider: Treating Provider/Extender: [name] [name] in Treatment: 4 Subjective Chief Complaint Information obtained from Patient Bilateral lower extremity wounds History of Present Illness (HPI) Admission 09/28/2021 Mr. 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 He states the wounds have been present for the past 6 months and wax and wane in healing. He has been seen by vein and vascular  for this issue. He has been treated with Unna boots with benefit. He states he started compression stockings when there was 1 wound remaining and now has developed multiple wounds. He currently takes 20 mg of Lasix and has been doing so for years. He  states he has been on several rounds of antibiotics for this issue. He last took Bactrim and completed this a couple days ago. He currently denies signs of infection. 11/29; patient presents for 1 week follow-up. He states that 4 days ago he felt burning to his legs bilaterally and removed the compression wraps. He saw vein and vascular yesterday and was placed in an Unna boot to the left leg. T oday He reports increased redness to the left leg. 12/6; patient presents for follow-up. He has no issues or complaints today. He completed his antibiotics and reports improvement in symptoms. At the last nurse visit since he had done well with Kerlix and Coban he was increased to 3 layer compression wrap. Over the past few days he has tolerated this well. He currently denies signs of infection. 12/13; patient presents for follow-up. He has no issues or complaints today. He has tolerated the 3 layer compression wrap well. He denies signs of infection. 12/20; patient presents for follow-up. He took the compression wraps off yesterday. He wanted to shower. He denies signs of infection. Patient History Information obtained from Patient, Chart. Family History Cancer - Mother, Diabetes - Father,Siblings, Heart Disease - Mother, Hypertension - Mother, No family history of Kidney Disease, Lung Disease, Seizures, Stroke, Thyroid Problems, Tuberculosis. Social History Current every [place] smoker - 1 1/2 ppd, Marital Status - Divorced, Alcohol Use - Never, Drug Use - No History, Caffeine Use - Daily - coffee. Medical History Eyes Denies history of Cataracts, Glaucoma, Optic Neuritis Ear/Nose/Mouth/Throat Denies history of Chronic sinus problems/congestion, Middle ear problems Hematologic/Lymphatic Denies history of Anemia, Hemophilia, Human Immunodeficiency Virus, Lymphedema, Sickle Cell Disease Respiratory Patient has history of Chronic Obstructive Pulmonary Disease (COPD) Denies history of Aspiration, Asthma,  Pneumothorax, Sleep Apnea, Tuberculosis Cardiovascular Patient has history of Deep Vein Thrombosis, Hypertension, Peripheral Venous Disease Denies history of Angina, Arrhythmia, Congestive Heart Failure, Coronary Artery Disease, Hypotension, Myocardial Infarction, Peripheral Arterial Disease, Phlebitis, Vasculitis Gastrointestinal Denies history of Cirrhosis , Colitis, Crohnoos, Hepatitis A, Hepatitis B, Hepatitis C Endocrine Denies history of Type I Diabetes, Type II Diabetes Genitourinary Denies history of End Stage Renal Disease Immunological Denies history of Lupus Erythematosus, Raynaudoos, Scleroderma Integumentary (Skin) Denies history of History of Burn Musculoskeletal Denies history of Gout, Rheumatoid Arthritis, Osteoarthritis, Osteomyelitis Neurologic Patient has history of Neuropathy - left sciatic nerve pain Denies history of Dementia, Quadriplegia, Paraplegia, Seizure Disorder Oncologic Denies history of Received Chemotherapy, Received Radiation Psychiatric Denies history of Anorexia/bulimia, Confinement Anxiety Hospitalization/Surgery History - Spinal Surgery over 20 years ago. Medical A Surgical History Notes nd Constitutional Symptoms (General Health) Stroke 2016 Migraine Cardiovascular PE Psychiatric Anxiety Objective Constitutional respirations regular, non-labored and within target range for patient.. Vitals Time Taken: 9:35 AM, Height: 71 in, Weight: 216 lbs, BMI: 30.1, Temperature: 97.8 F, Pulse: 80 bpm, Respiratory Rate: 20 breaths/min, Blood Pressure: 180/100 mmHg. General Notes: Per patient in severe pain with back and neck from falling and has not taken BP medication. Cardiovascular 2+ dorsalis pedis/posterior tibialis pulses. Psychiatric pleasant and cooperative. General Notes: Bilateral lower extremity wounds limited to skin breakdown with nonviable tissue in the wound bed. 2+ pitting edema. Varicose veins noted. Venous stasis dermatitis  bilaterally. Integumentary (Hair, Skin) Wound #1 status is  Open. Original cause of wound was Gradually Appeared. The date acquired was: 09/14/2021. The wound has been in treatment 4 weeks. The wound is located on the Left,Anterior Lower Leg. The wound measures 3cm length x 2.5cm width x 0.1cm depth; 5.89cm^2 area and 0.589cm^3 volume. There is Fat Layer (Subcutaneous Tissue) exposed. There is no tunneling or undermining noted. There is a medium amount of serosanguineous drainage noted. The wound margin is flat and intact. There is medium (34-66%) pink granulation within the wound bed. There is a medium (34-66%) amount of necrotic tissue within the wound bed including Adherent Slough. Wound #2 status is Open. Original cause of wound was Gradually Appeared. The date acquired was: 09/14/2021. The wound has been in treatment 4 weeks. The wound is located on the Left,Medial Lower Leg. The wound measures 0.2cm length x 0.2cm width x 0.1cm depth; 0.031cm^2 area and 0.003cm^3 volume. There is Fat Layer (Subcutaneous Tissue) exposed. There is no tunneling or undermining noted. There is a medium amount of serosanguineous drainage noted. The wound margin is flat and intact. There is large (67-100%) pink granulation within the wound bed. There is no necrotic tissue within the wound bed. Wound #3 status is Open. Original cause of wound was Gradually Appeared. The date acquired was: 09/14/2021. The wound has been in treatment 4 weeks. The wound is located on the Right,Anterior Lower Leg. The wound measures 0.8cm length x 0.4cm width x 0.1cm depth; 0.251cm^2 area and 0.025cm^3 volume. There is Fat Layer (Subcutaneous Tissue) exposed. There is no tunneling or undermining noted. There is a medium amount of serosanguineous drainage noted. The wound margin is flat and intact. There is medium (34-66%) pink granulation within the wound bed. There is a medium (34-66%) amount of necrotic tissue within the wound bed including  Adherent Slough. Assessment Active Problems ICD-10 Chronic venous hypertension (idiopathic) with ulcer of bilateral lower extremity Non-pressure chronic ulcer of other part of right lower leg with fat layer exposed Non-pressure chronic ulcer of other part of left lower leg with fat layer exposed Nicotine dependence, cigarettes, uncomplicated Essential (primary) hypertension Chronic obstructive pulmonary disease, unspecified Patient's wounds are stable. I debrided nonviable tissue. No signs of infection on exam. I recommended continuing with sorbact and calcium alginate under 3 layer compression. Procedures Wound #1 Pre-procedure diagnosis of Wound #1 is a Venous Leg Ulcer located on the Left,Anterior Lower Leg .Severity of Tissue Pre Debridement is: Fat layer exposed. There was a Excisional Skin/Subcutaneous Tissue Debridement with a total area of 7.5 sq cm performed by Kalman Shan, DO. With the following instrument(s): Curette to remove Viable and Non-Viable tissue/material. Material removed includes Subcutaneous Tissue, Slough, Skin: Dermis, Skin: Epidermis, and Fibrin/Exudate after achieving pain control using Other (Benzocaine 20%). A time out was conducted at 10:00, prior to the start of the procedure. A Minimum amount of bleeding was controlled with Pressure. The procedure was tolerated well with a pain level of 0 throughout and a pain level of 0 following the procedure. Post Debridement Measurements: 3cm length x 2.5cm width x 0.1cm depth; 0.589cm^3 volume. Character of Wound/Ulcer Post Debridement requires further debridement. Severity of Tissue Post Debridement is: Fat layer exposed. Post procedure Diagnosis Wound #1: Same as Pre-Procedure Pre-procedure diagnosis of Wound #1 is a Venous Leg Ulcer located on the Left,Anterior Lower Leg . There was a Three Layer Compression Therapy Procedure by [name] Pilling, RN. Post procedure Diagnosis Wound #1: Same as Pre-Procedure Wound  #2 Pre-procedure diagnosis of Wound #2 is a Venous Leg Ulcer located  on the Left,Medial Lower Leg .Severity of Tissue Pre Debridement is: Limited to breakdown of skin. There was a Excisional Skin/Subcutaneous Tissue Debridement with a total area of 0.04 sq cm performed by Kalman Shan, DO. With the following instrument(s): Curette to remove Viable and Non-Viable tissue/material. Material removed includes Subcutaneous Tissue, Slough, Skin: Dermis, Skin: Epidermis, and Fibrin/Exudate after achieving pain control using Other (Benzocaine 20%). A time out was conducted at 10:00, prior to the start of the procedure. A Minimum amount of bleeding was controlled with Pressure. The procedure was tolerated well with a pain level of 0 throughout and a pain level of 0 following the procedure. Post Debridement Measurements: 0.2cm length x 0.2cm width x 0.1cm depth; 0.003cm^3 volume. Character of Wound/Ulcer Post Debridement requires further debridement. Severity of Tissue Post Debridement is: Limited to breakdown of skin. Post procedure Diagnosis Wound #2: Same as Pre-Procedure Pre-procedure diagnosis of Wound #2 is a Venous Leg Ulcer located on the Left,Medial Lower Leg . There was a Three Layer Compression Therapy Procedure by [name] Pilling, RN. Post procedure Diagnosis Wound #2: Same as Pre-Procedure Wound #3 Pre-procedure diagnosis of Wound #3 is a Venous Leg Ulcer located on the Right,Anterior Lower Leg .Severity of Tissue Pre Debridement is: Fat layer exposed. There was a Excisional Skin/Subcutaneous Tissue Debridement with a total area of 0.32 sq cm performed by Kalman Shan, DO. With the following instrument(s): Curette to remove Viable and Non-Viable tissue/material. Material removed includes Subcutaneous Tissue, Slough, Skin: Dermis, Skin: Epidermis, and Fibrin/Exudate after achieving pain control using Other (Benzocaine 20%). A time out was conducted at 10:00, prior to the start of  the procedure. A Minimum amount of bleeding was controlled with Pressure. The procedure was tolerated well with a pain level of 0 throughout and a pain level of 0 following the procedure. Post Debridement Measurements: 0.8cm length x 0.4cm width x 0.1cm depth; 0.025cm^3 volume. Character of Wound/Ulcer Post Debridement requires further debridement. Severity of Tissue Post Debridement is: Fat layer exposed. Post procedure Diagnosis Wound #3: Same as Pre-Procedure Pre-procedure diagnosis of Wound #3 is a Venous Leg Ulcer located on the Right,Anterior Lower Leg . There was a Three Layer Compression Therapy Procedure by [name] Pilling, RN. Post procedure Diagnosis Wound #3: Same as Pre-Procedure Plan Follow-up Appointments: Return Appointment in 2 weeks. - Dr. Heber [place] Tuesday 11/09/2021 Nurse Visit: - Tuesday 11/02/2021 Other: - ****Patient to go to the ED, urgent care or call Primary care provider related to falling, back pain, and to be evaluated.*** Bathing/ Shower/ Hygiene: May shower with protection but do not get wound dressing(s) wet. - ***Do not remove the wraps.*** At appt times will remove wraps and wash legs.*** Edema Control - Lymphedema / SCD / Other: Elevate legs to the level of the heart or above for 30 minutes daily and/or when sitting, a frequency of: - 3-4 times a [place] throughout the [place]. Avoid standing for long periods of time. Exercise regularly Additional Orders / Instructions: Stop/Decrease Smoking WOUND #1: - Lower Leg Wound Laterality: Left, Anterior Cleanser: Soap and Water 1 x Per Week/ Discharge Instructions: May shower and wash wound with dial antibacterial soap and water prior to dressing change. Cleanser: Wound Cleanser 1 x Per Week/ Discharge Instructions: Cleanse the wound with wound cleanser prior to applying a clean dressing using gauze sponges, not tissue or cotton balls. Peri-Wound Care: Triamcinolone 15 (g) 1 x Per Week/ Discharge Instructions: Use  triamcinolone 15 (g) as directed Peri-Wound Care: Zinc Oxide Ointment 30g tube 1 x Per Week/  Discharge Instructions: Apply Zinc Oxide to periwound with each dressing change Peri-Wound Care: Sween Lotion (Moisturizing lotion) 1 x Per Week/ Discharge Instructions: Apply moisturizing lotion as directed Prim Dressing: Maxorb Extra Calcium Alginate 2x2 in 1 x Per Week/ ary Discharge Instructions: Apply calcium alginate to wound bed as instructed Prim Dressing: Cutimed Sorbact Swab 1 x Per Week/ ary Discharge Instructions: Apply to wound bed as instructed Secondary Dressing: ABD Pad, 8x10 1 x Per Week/ Discharge Instructions: Apply over primary dressing as directed. Com pression Wrap: ThreePress (3 layer compression wrap) 1 x Per Week/ Discharge Instructions: Apply three layer compression as directed. WOUND #2: - Lower Leg Wound Laterality: Left, Medial Cleanser: Soap and Water 1 x Per Week/ Discharge Instructions: May shower and wash wound with dial antibacterial soap and water prior to dressing change. Cleanser: Wound Cleanser 1 x Per Week/ Discharge Instructions: Cleanse the wound with wound cleanser prior to applying a clean dressing using gauze sponges, not tissue or cotton balls. Peri-Wound Care: Triamcinolone 15 (g) 1 x Per Week/ Discharge Instructions: Use triamcinolone 15 (g) as directed Peri-Wound Care: Zinc Oxide Ointment 30g tube 1 x Per Week/ Discharge Instructions: Apply Zinc Oxide to periwound with each dressing change Peri-Wound Care: Sween Lotion (Moisturizing lotion) 1 x Per Week/ Discharge Instructions: Apply moisturizing lotion as directed Prim Dressing: Maxorb Extra Calcium Alginate 2x2 in 1 x Per Week/ ary Discharge Instructions: Apply calcium alginate to wound bed as instructed Prim Dressing: Cutimed Sorbact Swab 1 x Per Week/ ary Discharge Instructions: Apply to wound bed as instructed Secondary Dressing: ABD Pad, 8x10 1 x Per Week/ Discharge Instructions: Apply  over primary dressing as directed. Com pression Wrap: ThreePress (3 layer compression wrap) 1 x Per Week/ Discharge Instructions: Apply three layer compression as directed. WOUND #3: - Lower Leg Wound Laterality: Right, Anterior Cleanser: Soap and Water 1 x Per Week/ Discharge Instructions: May shower and wash wound with dial antibacterial soap and water prior to dressing change. Cleanser: Wound Cleanser 1 x Per Week/ Discharge Instructions: Cleanse the wound with wound cleanser prior to applying a clean dressing using gauze sponges, not tissue or cotton balls. Peri-Wound Care: Triamcinolone 15 (g) 1 x Per Week/ Discharge Instructions: Use triamcinolone 15 (g) as directed Peri-Wound Care: Zinc Oxide Ointment 30g tube 1 x Per Week/ Discharge Instructions: Apply Zinc Oxide to periwound with each dressing change Peri-Wound Care: Sween Lotion (Moisturizing lotion) 1 x Per Week/ Discharge Instructions: Apply moisturizing lotion as directed Prim Dressing: Maxorb Extra Calcium Alginate 2x2 in 1 x Per Week/ ary Discharge Instructions: Apply calcium alginate to wound bed as instructed Prim Dressing: Cutimed Sorbact Swab 1 x Per Week/ ary Discharge Instructions: Apply to wound bed as instructed Secondary Dressing: ABD Pad, 8x10 1 x Per Week/ Discharge Instructions: Apply over primary dressing as directed. Com pression Wrap: ThreePress (3 layer compression wrap) 1 x Per Week/ Discharge Instructions: Apply three layer compression as directed. 1. In office sharp debridement 2. Sorb act and calcium alginate under 3 layer compression 3. Follow-up in 1 week for nurse visit and in 2 weeks with me Electronic Signature(s) Signed: 10/26/2021 10:26:40 AM By: Kalman Shan DO Entered By: Kalman Shan on 10/26/2021 10:26:11 -------------------------------------------------------------------------------- HxROS Details Patient Name: Date of Service: DA Steele Berg [name] 10/26/2021 9:15 A M Medical Record  Number: [id number] Patient Account Number: [id number] Date of Birth/Sex: Treating RN: 1961/09/09 (60 y.o. [name] [name] Primary Care Provider: Fonnie [name] Other Clinician: Referring Provider: Treating Provider/Extender: [name] [name] in  Treatment: 4 Information Obtained From Patient Chart Constitutional Symptoms (General Health) Medical History: Past Medical History Notes: Stroke 2016 Migraine Eyes Medical History: Negative for: Cataracts; Glaucoma; Optic Neuritis Ear/Nose/Mouth/Throat Medical History: Negative for: Chronic sinus problems/congestion; Middle ear problems Hematologic/Lymphatic Medical History: Negative for: Anemia; Hemophilia; Human Immunodeficiency Virus; Lymphedema; Sickle Cell Disease Respiratory Medical History: Positive for: Chronic Obstructive Pulmonary Disease (COPD) Negative for: Aspiration; Asthma; Pneumothorax; Sleep Apnea; Tuberculosis Cardiovascular Medical History: Positive for: Deep Vein Thrombosis; Hypertension; Peripheral Venous Disease Negative for: Angina; Arrhythmia; Congestive Heart Failure; Coronary Artery Disease; Hypotension; Myocardial Infarction; Peripheral Arterial Disease; Phlebitis; Vasculitis Past Medical History Notes: PE Gastrointestinal Medical History: Negative for: Cirrhosis ; Colitis; Crohns; Hepatitis A; Hepatitis B; Hepatitis C Endocrine Medical History: Negative for: Type I Diabetes; Type II Diabetes Genitourinary Medical History: Negative for: End Stage Renal Disease Immunological Medical History: Negative for: Lupus Erythematosus; Raynauds; Scleroderma Integumentary (Skin) Medical History: Negative for: History of Burn Musculoskeletal Medical History: Negative for: Gout; Rheumatoid Arthritis; Osteoarthritis; Osteomyelitis Neurologic Medical History: Positive for: Neuropathy - left sciatic nerve pain Negative for: Dementia; Quadriplegia; Paraplegia; Seizure  Disorder Oncologic Medical History: Negative for: Received Chemotherapy; Received Radiation Psychiatric Medical History: Negative for: Anorexia/bulimia; Confinement Anxiety Past Medical History Notes: Anxiety Immunizations Pneumococcal Vaccine: Received Pneumococcal Vaccination: No Implantable Devices No devices added Hospitalization / Surgery History Type of Hospitalization/Surgery Spinal Surgery over 20 years ago Family and Social History Cancer: Yes - Mother; Diabetes: Yes - Father,Siblings; Heart Disease: Yes - Mother; Hypertension: Yes - Mother; Kidney Disease: No; Lung Disease: No; Seizures: No; Stroke: No; Thyroid Problems: No; Tuberculosis: No; Current every [place] smoker - 1 1/2 ppd; Marital Status - Divorced; Alcohol Use: Never; Drug Use: No History; Caffeine Use: Daily - coffee; Financial Concerns: No; Food, Clothing or Shelter Needs: No; Support System Lacking: No; Transportation Concerns: No Electronic Signature(s) Signed: 10/26/2021 10:26:40 AM By: Kalman Shan DO Signed: 10/26/2021 5:06:43 PM By: [name] Pilling RN, BSN Entered By: Kalman Shan on 10/26/2021 10:23:33 -------------------------------------------------------------------------------- SuperBill Details Patient Name: Date of Service: [name] [name] 10/26/2021 Medical Record Number: [id number] Patient Account Number: [id number] Date of Birth/Sex: Treating RN: November 14, 1960 (60 y.o. [name] [name], [name] [name] Primary Care Provider: Fonnie [name] Other Clinician: Referring Provider: Treating Provider/Extender: [name] [name] in Treatment: 4 Diagnosis Coding ICD-10 Codes Code Description [id number] Chronic venous hypertension (idiopathic) with ulcer of bilateral lower extremity L97.812 Non-pressure chronic ulcer of other part of right lower leg with fat layer exposed L97.822 Non-pressure chronic ulcer of other part of left lower leg with fat layer exposed F17.210 Nicotine dependence,  cigarettes, uncomplicated B14 Essential (primary) hypertension J44.9 Chronic obstructive pulmonary disease, unspecified Facility Procedures CPT4 Code: [id number] 1 Description: 3086 - DEB SUBQ TISSUE 20 SQ CM/< ICD-10 Diagnosis Description V78.469 Non-pressure chronic ulcer of other part of right lower leg with fat layer exp L97.822 Non-pressure chronic ulcer of other part of left lower leg with fat layer expo Modifier: osed sed Quantity: 1 Physician Procedures : CPT4 Code Description Modifier [id number] - WC PHYS SUBQ TISS 20 SQ CM ICD-10 Diagnosis Description W10.272 Non-pressure chronic ulcer of other part of right lower leg with fat layer exposed L97.822 Non-pressure chronic ulcer of other part of left  lower leg with fat layer exposed Quantity: 1 Electronic Signature(s) Signed: 10/26/2021 10:26:40 AM By: Kalman Shan DO Entered By: Kalman Shan on 10/26/2021 10:26:24

## 2021-11-02 ENCOUNTER — Other Ambulatory Visit: Payer: Self-pay

## 2021-11-02 ENCOUNTER — Encounter (HOSPITAL_BASED_OUTPATIENT_CLINIC_OR_DEPARTMENT_OTHER): Payer: Medicaid Other | Admitting: Internal Medicine

## 2021-11-02 DIAGNOSIS — L97822 Non-pressure chronic ulcer of other part of left lower leg with fat layer exposed: Secondary | ICD-10-CM | POA: Diagnosis not present

## 2021-11-02 DIAGNOSIS — L97812 Non-pressure chronic ulcer of other part of right lower leg with fat layer exposed: Secondary | ICD-10-CM | POA: Diagnosis not present

## 2021-11-02 DIAGNOSIS — Z7689 Persons encountering health services in other specified circumstances: Secondary | ICD-10-CM | POA: Diagnosis not present

## 2021-11-02 NOTE — Progress Notes (Signed)
ZACARIAS, KRAUTER ([id number]) Visit Report for 11/02/2021 Arrival Information Details Patient Name: Date of Service: [name] [name] 11/02/2021 9:30 A M Medical Record Number: [id number] Patient Account Number: [id number] Date of Birth/Sex: Treating RN: 09-Jun-1961 (60 y.o. Ulyses Amor, Vaughan Basta Primary Care Senai Ramnath: Fonnie Jarvis Other Clinician: Referring Donae Kueker: Treating Esbeidy Mclaine/Extender: Dorann Ou in Treatment: 5 Visit Information History Since Last Visit Added or deleted any medications: No Patient Arrived: [name] Any new allergies or adverse reactions: No Arrival Time: 09:19 Had a fall or experienced change in No Accompanied By: self activities of daily living that may affect Transfer Assistance: None risk of falls: Patient Requires Transmission-Based Precautions: No Signs or symptoms of abuse/neglect since last visito No Patient Has Alerts: Yes Hospitalized since last visit: No Patient Alerts: Patient on Blood Thinner Implantable device outside of the clinic excluding No cellular tissue based products placed in the center since last visit: Has Dressing in Place as Prescribed: Yes Has Compression in Place as Prescribed: Yes Pain Present Now: Yes Electronic Signature(s) Signed: 11/02/2021 12:59:02 PM By: Baruch Gouty RN, BSN Entered By: Baruch Gouty on 11/02/2021 09:20:48 -------------------------------------------------------------------------------- Compression Therapy Details Patient Name: Date of Service: [name] [name] 11/02/2021 9:30 A M Medical Record Number: [id number] Patient Account Number: [id number] Date of Birth/Sex: Treating RN: [birth year] (60 y.o. Ernestene Mention Primary Care Sonam Huelsmann: Fonnie Jarvis Other Clinician: Referring Aragon Scarantino: Treating Chloris Marcoux/Extender: Dorann Ou in Treatment: 5 Compression Therapy Performed for Wound Assessment: Wound #1 Left,Anterior Lower Leg Performed  By: Clinician Baruch Gouty, RN Compression Type: Three Hydrologist) Signed: 11/02/2021 12:59:02 PM By: Baruch Gouty RN, BSN Entered By: Baruch Gouty on 11/02/2021 09:48:28 -------------------------------------------------------------------------------- Compression Therapy Details Patient Name: Date of Service: [name] [name] 11/02/2021 9:30 A M Medical Record Number: [id number] Patient Account Number: [id number] Date of Birth/Sex: Treating RN: 01-Mar-1961 (60 y.o. Ernestene Mention Primary Care Galena Logie: Fonnie Jarvis Other Clinician: Referring Lilias Lorensen: Treating Bereket Gernert/Extender: Dorann Ou in Treatment: 5 Compression Therapy Performed for Wound Assessment: Wound #3 Right,Anterior Lower Leg Performed By: Clinician Baruch Gouty, RN Compression Type: Three Hydrologist) Signed: 11/02/2021 12:59:02 PM By: Baruch Gouty RN, BSN Entered By: Baruch Gouty on 11/02/2021 09:48:28 -------------------------------------------------------------------------------- Encounter Discharge Information Details Patient Name: Date of Service: [name] [name] 11/02/2021 9:30 [place] Record Number: [id number] Patient Account Number: [id number] Date of Birth/Sex: Treating RN: 02/04/1961 (60 y.o. Ernestene Mention Primary Care Othelia Riederer: Fonnie Jarvis Other Clinician: Referring Savan Ruta: Treating Jeral Zick/Extender: Dorann Ou in Treatment: 5 Encounter Discharge Information Items Discharge Condition: Stable Ambulatory Status: Walker Discharge Destination: Home Transportation: Private Auto Accompanied By: self Schedule Follow-up Appointment: Yes Clinical Summary of Care: Patient Declined Electronic Signature(s) Signed: 11/02/2021 12:59:02 PM By: Baruch Gouty RN, BSN Entered By: Baruch Gouty on 11/02/2021  09:50:08 -------------------------------------------------------------------------------- Pain Assessment Details Patient Name: Date of Service: [name] [name] 11/02/2021 9:30 A M Medical Record Number: [id number] Patient Account Number: [id number] Date of Birth/Sex: Treating RN: 1960/11/25 (60 y.o. Ernestene Mention Primary Care Rendell Thivierge: Fonnie Jarvis Other Clinician: Referring Rashawna Scoles: Treating Goro Wenrick/Extender: Dorann Ou in Treatment: 5 Active Problems Location of Pain Severity and Description of Pain Patient Has Paino Yes Site Locations Pain Location: Pain Location: Generalized Pain, Pain in Ulcers With Dressing Change: No Duration of the Pain. Constant / Intermittento Constant Rate the pain. Current Pain Level: 7 Least Pain Level: 6 Character of Pain Describe the Pain: Aching Pain Management and  Medication Current Pain Management: Other: tolerates Is the Current Pain Management Adequate: Adequate How does your wound impact your activities of daily livingo Sleep: Yes Bathing: No Appetite: No Relationship With Others: No Bladder Continence: No Emotions: No Bowel Continence: No Work: Yes Toileting: No Drive: No Dressing: No Hobbies: Astronomer) Signed: 11/02/2021 12:59:02 PM By: Baruch Gouty RN, BSN Entered By: Baruch Gouty on 11/02/2021 09:22:29 -------------------------------------------------------------------------------- Patient/Caregiver Education Details Patient Name: Date of Service: [name] [name] 12/27/2022andnbsp9:30 [place] Record Number: [id number] Patient Account Number: [id number] Date of Birth/Gender: Treating RN: 06-02-1961 (60 y.o. Ernestene Mention Primary Care Physician: Fonnie Jarvis Other Clinician: Referring Physician: Treating Physician/Extender: Dorann Ou in Treatment: 5 Education Assessment Education Provided  To: Patient Education Topics Provided Venous: Methods: Explain/Verbal Responses: Reinforcements needed, State content correctly Electronic Signature(s) Signed: 11/02/2021 12:59:02 PM By: Baruch Gouty RN, BSN Entered By: Baruch Gouty on 11/02/2021 09:49:37 -------------------------------------------------------------------------------- Wound Assessment Details Patient Name: Date of Service: [name] [name] 11/02/2021 9:30 A M Medical Record Number: [id number] Patient Account Number: [id number] Date of Birth/Sex: Treating RN: 11/04/1961 (60 y.o. Ernestene Mention Primary Care [place] Reader: Fonnie Jarvis Other Clinician: Referring Srah Ake: Treating Stalin Gruenberg/Extender: Aloha Gell Weeks in Treatment: 5 Wound Status Wound Number: 1 Primary Venous Leg Ulcer Etiology: Wound Location: Left, Anterior Lower Leg Wound Open Wounding Event: Gradually Appeared Status: Date Acquired: 09/14/2021 Comorbid Chronic Obstructive Pulmonary Disease (COPD), Deep Vein Weeks Of Treatment: 5 History: Thrombosis, Hypertension, Peripheral Venous Disease, Clustered Wound: No Neuropathy Wound Measurements Length: (cm) 3 Width: (cm) 2.5 Depth: (cm) 0.1 Area: (cm) 5.89 Volume: (cm) 0.589 % Reduction in Area: 8.1% % Reduction in Volume: 8.1% Epithelialization: Small (1-33%) Tunneling: No Undermining: No Wound Description Classification: Full Thickness Without Exposed Support Structures Wound Margin: Flat and Intact Exudate Amount: Medium Exudate Type: Serosanguineous Exudate Color: red, brown Foul Odor After Cleansing: No Slough/Fibrino Yes Wound Bed Granulation Amount: Medium (34-66%) Exposed Structure Granulation Quality: Pink Fascia Exposed: No Necrotic Amount: Medium (34-66%) Fat Layer (Subcutaneous Tissue) Exposed: Yes Necrotic Quality: Adherent Slough Tendon Exposed: No Muscle Exposed: No Joint Exposed: No Bone Exposed: No Treatment Notes Wound #1  (Lower Leg) Wound Laterality: Left, Anterior Cleanser Soap and Water Discharge Instruction: May shower and wash wound with dial antibacterial soap and water prior to dressing change. Wound Cleanser Discharge Instruction: Cleanse the wound with wound cleanser prior to applying a clean dressing using gauze sponges, not tissue or cotton balls. Peri-Wound Care Triamcinolone 15 (g) Discharge Instruction: Use triamcinolone 15 (g) as directed Sween Lotion (Moisturizing lotion) Discharge Instruction: Apply moisturizing lotion as directed Topical Primary Dressing Maxorb Extra Calcium Alginate 2x2 in Discharge Instruction: Apply calcium alginate to wound bed as instructed Cutimed Sorbact Swab Discharge Instruction: Apply to wound bed as instructed Secondary Dressing Woven Gauze Sponge, Non-Sterile 4x4 in Discharge Instruction: Apply over primary dressing as directed. Secured With Compression Wrap ThreePress (3 layer compression wrap) Discharge Instruction: Apply three layer compression as directed. Compression Stockings Add-Ons Electronic Signature(s) Signed: 11/02/2021 12:59:02 PM By: Baruch Gouty RN, BSN Entered By: Baruch Gouty on 11/02/2021 09:46:08 -------------------------------------------------------------------------------- Wound Assessment Details Patient Name: Date of Service: [name] [name] 11/02/2021 9:30 A M Medical Record Number: [id number] Patient Account Number: [id number] Date of Birth/Sex: Treating RN: 05/25/1961 (60 y.o. Ernestene Mention Primary Care [place] Ladson: Fonnie Jarvis Other Clinician: Referring Reggie Welge: Treating Vashawn Ekstein/Extender: Aloha Gell Weeks in Treatment: 5 Wound Status Wound Number: 2 Primary Venous Leg Ulcer Etiology: Wound Location: Left,  Medial Lower Leg Wound Open Wounding Event: Gradually Appeared Status: Date Acquired: 09/14/2021 Comorbid Chronic Obstructive Pulmonary Disease (COPD), Deep Vein Weeks Of  Treatment: 5 History: Thrombosis, Hypertension, Peripheral Venous Disease, Clustered Wound: No Neuropathy Wound Measurements Length: (cm) 0.2 Width: (cm) 0.2 Depth: (cm) 1 Area: (cm) 0.031 Volume: (cm) 0.031 % Reduction in Area: 94% % Reduction in Volume: 40.4% Epithelialization: Large (67-100%) Tunneling: No Undermining: No Wound Description Classification: Full Thickness Without Exposed Support Structures Wound Margin: Flat and Intact Exudate Amount: None Present Foul Odor After Cleansing: No Slough/Fibrino No Wound Bed Granulation Amount: None Present (0%) Exposed Structure Necrotic Amount: None Present (0%) Fascia Exposed: No Fat Layer (Subcutaneous Tissue) Exposed: No Tendon Exposed: No Muscle Exposed: No Joint Exposed: No Bone Exposed: No Limited to Skin Breakdown Treatment Notes Wound #2 (Lower Leg) Wound Laterality: Left, Medial Cleanser Soap and Water Discharge Instruction: May shower and wash wound with dial antibacterial soap and water prior to dressing change. Wound Cleanser Discharge Instruction: Cleanse the wound with wound cleanser prior to applying a clean dressing using gauze sponges, not tissue or cotton balls. Peri-Wound Care Triamcinolone 15 (g) Discharge Instruction: Use triamcinolone 15 (g) as directed Sween Lotion (Moisturizing lotion) Discharge Instruction: Apply moisturizing lotion as directed Topical Primary Dressing Maxorb Extra Calcium Alginate 2x2 in Discharge Instruction: Apply calcium alginate to wound bed as instructed Cutimed Sorbact Swab Discharge Instruction: Apply to wound bed as instructed Secondary Dressing Woven Gauze Sponge, Non-Sterile 4x4 in Discharge Instruction: Apply over primary dressing as directed. Secured With Compression Wrap ThreePress (3 layer compression wrap) Discharge Instruction: Apply three layer compression as directed. Compression Stockings Add-Ons Electronic Signature(s) Signed: 11/02/2021  12:59:02 PM By: Baruch Gouty RN, BSN Entered By: Baruch Gouty on 11/02/2021 09:46:33 -------------------------------------------------------------------------------- Wound Assessment Details Patient Name: Date of Service: [name] [name] 11/02/2021 9:30 A M Medical Record Number: [id number] Patient Account Number: [id number] Date of Birth/Sex: Treating RN: [birth year] (60 y.o. Ernestene Mention Primary Care Draylen Lobue: Fonnie Jarvis Other Clinician: Referring Reona Zendejas: Treating Wetona Viramontes/Extender: Aloha Gell Weeks in Treatment: 5 Wound Status Wound Number: 3 Primary Venous Leg Ulcer Etiology: Wound Location: Right, Anterior Lower Leg Wound Open Wounding Event: Gradually Appeared Status: Date Acquired: 09/14/2021 Comorbid Chronic Obstructive Pulmonary Disease (COPD), Deep Vein Weeks Of Treatment: 5 History: Thrombosis, Hypertension, Peripheral Venous Disease, Clustered Wound: No Neuropathy Wound Measurements Length: (cm) 0.8 Width: (cm) 0.4 Depth: (cm) 0.1 Area: (cm) 0.251 Volume: (cm) 0.025 % Reduction in Area: 50.9% % Reduction in Volume: 51% Epithelialization: Large (67-100%) Tunneling: No Undermining: No Wound Description Classification: Full Thickness Without Exposed Support Structures Wound Margin: Flat and Intact Exudate Amount: Small Exudate Type: Sanguinous Exudate Color: red Foul Odor After Cleansing: No Slough/Fibrino Yes Wound Bed Granulation Amount: Large (67-100%) Exposed Structure Granulation Quality: Pink Fascia Exposed: No Necrotic Amount: None Present (0%) Fat Layer (Subcutaneous Tissue) Exposed: Yes Tendon Exposed: No Muscle Exposed: No Joint Exposed: No Bone Exposed: No Treatment Notes Wound #3 (Lower Leg) Wound Laterality: Right, Anterior Cleanser Soap and Water Discharge Instruction: May shower and wash wound with dial antibacterial soap and water prior to dressing change. Wound Cleanser Discharge  Instruction: Cleanse the wound with wound cleanser prior to applying a clean dressing using gauze sponges, not tissue or cotton balls. Peri-Wound Care Triamcinolone 15 (g) Discharge Instruction: Use triamcinolone 15 (g) as directed Sween Lotion (Moisturizing lotion) Discharge Instruction: Apply moisturizing lotion as directed Topical Primary Dressing Maxorb Extra Calcium Alginate 2x2 in Discharge Instruction: Apply calcium alginate to wound bed as  instructed Cutimed Sorbact Swab Discharge Instruction: Apply to wound bed as instructed Secondary Dressing Woven Gauze Sponge, Non-Sterile 4x4 in Discharge Instruction: Apply over primary dressing as directed. Secured With Compression Wrap ThreePress (3 layer compression wrap) Discharge Instruction: Apply three layer compression as directed. Compression Stockings Add-Ons Electronic Signature(s) Signed: 11/02/2021 12:59:02 PM By: Baruch Gouty RN, BSN Entered By: Baruch Gouty on 11/02/2021 09:48:07 -------------------------------------------------------------------------------- [place] Details Patient Name: Date of Service: [name] [name] 11/02/2021 9:30 A M Medical Record Number: [id number] Patient Account Number: [id number] Date of Birth/Sex: Treating RN: [birth year] (60 y.o. Ernestene Mention Primary Care Elaina Cara: Fonnie Jarvis Other Clinician: Referring Legend Pecore: Treating Evelynn Hench/Extender: Dorann Ou in Treatment: 5 Vital Signs Time Taken: 09:22 Temperature (F): 98.1 Height (in): 71 Pulse (bpm): 74 Source: Stated Respiratory Rate (breaths/min): 20 Weight (lbs): 216 Blood Pressure (mmHg): 179/88 Source: Stated Reference Range: 80 - 120 mg / dl Body Mass Index (BMI): 30.1 Electronic Signature(s) Signed: 11/02/2021 12:59:02 PM By: Baruch Gouty RN, BSN Entered By: Baruch Gouty on 11/02/2021 09:22:55

## 2021-11-04 NOTE — Progress Notes (Signed)
EZRAH, PANNING ([id number]) Visit Report for 11/02/2021 [place] Details Patient Name: Date of Service: Romie Jumper 11/02/2021 Medical Record Number: [id number] Patient Account Number: [id number] Date of Birth/Sex: Treating RN: 07-Apr-1961 (60 y.o. Ernestene Mention Primary Care Provider: Fonnie Jarvis Other Clinician: Referring Provider: Treating Provider/Extender: Dorann Ou in Treatment: 5 Diagnosis Coding ICD-10 Codes Code Description [phone] Chronic venous hypertension (idiopathic) with ulcer of bilateral lower extremity L97.812 Non-pressure chronic ulcer of other part of right lower leg with fat layer exposed L97.822 Non-pressure chronic ulcer of other part of left lower leg with fat layer exposed F17.210 Nicotine dependence, cigarettes, uncomplicated S01 Essential (primary) hypertension J44.9 Chronic obstructive pulmonary disease, unspecified Facility Procedures CPT4 Description Modifier Quantity Code [id number] BILATERAL: Application of multi-layer venous compression system; leg (below knee), including ankle and 1 foot. Electronic Signature(s) Signed: 11/02/2021 12:59:02 PM By: Baruch Gouty RN, BSN Signed: 11/04/2021 11:32:23 AM By: Kalman Shan DO Entered By: Baruch Gouty on 11/02/2021 09:50:51

## 2021-11-07 DIAGNOSIS — Z419 Encounter for procedure for purposes other than remedying health state, unspecified: Secondary | ICD-10-CM | POA: Diagnosis not present

## 2021-11-09 ENCOUNTER — Other Ambulatory Visit: Payer: Self-pay

## 2021-11-09 ENCOUNTER — Encounter (HOSPITAL_BASED_OUTPATIENT_CLINIC_OR_DEPARTMENT_OTHER): Payer: Medicaid Other | Attending: Internal Medicine | Admitting: Internal Medicine

## 2021-11-09 DIAGNOSIS — Z86718 Personal history of other venous thrombosis and embolism: Secondary | ICD-10-CM | POA: Diagnosis not present

## 2021-11-09 DIAGNOSIS — Z7689 Persons encountering health services in other specified circumstances: Secondary | ICD-10-CM | POA: Diagnosis not present

## 2021-11-09 DIAGNOSIS — L97812 Non-pressure chronic ulcer of other part of right lower leg with fat layer exposed: Secondary | ICD-10-CM | POA: Insufficient documentation

## 2021-11-09 DIAGNOSIS — I87313 Chronic venous hypertension (idiopathic) with ulcer of bilateral lower extremity: Secondary | ICD-10-CM | POA: Insufficient documentation

## 2021-11-09 DIAGNOSIS — F17210 Nicotine dependence, cigarettes, uncomplicated: Secondary | ICD-10-CM | POA: Diagnosis not present

## 2021-11-09 DIAGNOSIS — G629 Polyneuropathy, unspecified: Secondary | ICD-10-CM | POA: Diagnosis not present

## 2021-11-09 DIAGNOSIS — I10 Essential (primary) hypertension: Secondary | ICD-10-CM | POA: Insufficient documentation

## 2021-11-09 DIAGNOSIS — J449 Chronic obstructive pulmonary disease, unspecified: Secondary | ICD-10-CM | POA: Diagnosis not present

## 2021-11-09 DIAGNOSIS — L97822 Non-pressure chronic ulcer of other part of left lower leg with fat layer exposed: Secondary | ICD-10-CM | POA: Diagnosis not present

## 2021-11-09 NOTE — Progress Notes (Signed)
[name], [name] ([id number]) Visit Report for 11/09/2021 Arrival Information Details Patient Name: Date of Service: [name] [name] 11/09/2021 10:30 A M Medical Record Number: [id number] Patient Account Number: [id number] Date of Birth/Sex: Treating RN: 12-Jan-1961 (61 y.o. [name] Primary Care : Fonnie Jarvis Other Clinician: Referring : Treating /Extender: Dorann Ou in Treatment: 6 Visit Information History Since Last Visit Added or deleted any medications: No Patient Arrived: Ambulatory Any new allergies or adverse reactions: No Arrival Time: 10:23 Had a fall or experienced change in No Accompanied By: self activities of daily living that may affect Transfer Assistance: None risk of falls: Patient Identification Verified: Yes Signs or symptoms of abuse/neglect since last visito No Patient Requires Transmission-Based Precautions: No Hospitalized since last visit: No Patient Has Alerts: Yes Implantable device outside of the clinic excluding No Patient Alerts: Patient on Blood Thinner cellular tissue based products placed in the center since last visit: Has Dressing in Place as Prescribed: Yes Has Compression in Place as Prescribed: Yes Pain Present Now: Yes Electronic Signature(s) Signed: 11/09/2021 5:19:42 PM By: Dellie Catholic RN Entered By: Dellie Catholic on 11/09/2021 10:27:32 -------------------------------------------------------------------------------- Compression Therapy Details Patient Name: Date of Service: [name] [name] 11/09/2021 10:30 A M Medical Record Number: [id number] Patient Account Number: [id number] Date of Birth/Sex: Treating RN: [birth year] (61 y.o. [name] [name] Primary Care : Fonnie Jarvis Other Clinician: Referring : Treating /Extender: Dorann Ou in Treatment: 6 Compression Therapy Performed for Wound Assessment: Wound #1  Left,Anterior Lower Leg Performed By: Clinician Deon Pilling, RN Compression Type: Three Layer Post Procedure Diagnosis Same as Pre-procedure Electronic Signature(s) Signed: 11/09/2021 5:28:06 PM By: Deon Pilling RN, BSN Entered By: Deon Pilling on 11/09/2021 10:45:03 -------------------------------------------------------------------------------- Compression Therapy Details Patient Name: Date of Service: [name] [name] 11/09/2021 10:30 A M Medical Record Number: [id number] Patient Account Number: [id number] Date of Birth/Sex: Treating RN: 04-24-1961 (61 y.o. [name] [name] Primary Care : Fonnie Jarvis Other Clinician: Referring : Treating /Extender: Dorann Ou in Treatment: 6 Compression Therapy Performed for Wound Assessment: Wound #3 Right,Anterior Lower Leg Performed By: Clinician Deon Pilling, RN Compression Type: Three Layer Post Procedure Diagnosis Same as Pre-procedure Electronic Signature(s) Signed: 11/09/2021 5:28:06 PM By: Deon Pilling RN, BSN Entered By: Deon Pilling on 11/09/2021 10:45:04 -------------------------------------------------------------------------------- Encounter Discharge Information Details Patient Name: Date of Service: [name] [name] 11/09/2021 10:30 A M Medical Record Number: [id number] Patient Account Number: [id number] Date of Birth/Sex: Treating RN: 1961-07-30 (61 y.o. [name] [name] Primary Care : Fonnie Jarvis Other Clinician: Referring : Treating /Extender: Dorann Ou in Treatment: 6 Encounter Discharge Information Items Discharge Condition: Stable Ambulatory Status: Walker Discharge Destination: Home Transportation: Private Auto Accompanied By: self Schedule Follow-up Appointment: Yes Clinical Summary of Care: Electronic Signature(s) Signed: 11/09/2021 5:28:06 PM By: Deon Pilling RN, BSN Entered By: Deon Pilling  on 11/09/2021 10:48:45 -------------------------------------------------------------------------------- Lower Extremity Assessment Details Patient Name: Date of Service: [name] [name] 11/09/2021 10:30 A M Medical Record Number: [id number] Patient Account Number: [id number] Date of Birth/Sex: Treating RN: 05-23-1961 (61 y.o. [name] Primary Care : Fonnie Jarvis Other Clinician: Referring : Treating /Extender: Dorann Ou in Treatment: 6 Edema Assessment Assessed: [Left: No] [Right: No] Edema: [Left: Yes] [Right: Yes] Calf Left: Right: Point of Measurement: 34 cm From Medial Instep 40.5 cm 42.3 cm Ankle Left: Right: Point of Measurement: 9 cm From Medial Instep 27 cm 27.3 cm Knee To  Floor Left: Right: From Medial Instep 44 cm 44 cm Vascular Assessment Pulses: Dorsalis Pedis Palpable: [Left:Yes] [Right:Yes] Electronic Signature(s) Signed: 11/09/2021 5:19:42 PM By: Dellie Catholic RN Signed: 11/09/2021 5:28:06 PM By: Deon Pilling RN, BSN Entered By: Deon Pilling on 11/09/2021 10:47:40 -------------------------------------------------------------------------------- Multi Wound Chart Details Patient Name: Date of Service: [name] [name] 11/09/2021 10:30 A M Medical Record Number: [id number] Patient Account Number: [id number] Date of Birth/Sex: Treating RN: 12-20-1960 (61 y.o. [name] [name], [name].Reding Primary Care : Fonnie Jarvis Other Clinician: Referring : Treating /Extender: Dorann Ou in Treatment: 6 Vital Signs Height(in): 71 Pulse(bpm): 47 Weight(lbs): 216 Blood Pressure(mmHg): 137/83 Body Mass Index(BMI): 30 Temperature(F): 98.2 Respiratory Rate(breaths/min): 18 Photos: Left, Anterior Lower Leg Left, Medial Lower Leg Right, Anterior Lower Leg Wound Location: Gradually Appeared Gradually Appeared Gradually Appeared Wounding Event: Venous Leg Ulcer  Venous Leg Ulcer Venous Leg Ulcer Primary Etiology: Chronic Obstructive Pulmonary Chronic Obstructive Pulmonary Chronic Obstructive Pulmonary Comorbid History: Disease (COPD), Deep Vein Disease (COPD), Deep Vein Disease (COPD), Deep Vein Thrombosis, Hypertension, Peripheral Thrombosis, Hypertension, Peripheral Thrombosis, Hypertension, Peripheral Venous Disease, Neuropathy Venous Disease, Neuropathy Venous Disease, Neuropathy 09/14/2021 09/14/2021 09/14/2021 Date Acquired: _0 Weeks of Treatment: Open Healed - Epithelialized Open Wound Status: 1.2x0.9x0.1 0x0x0 0.1x0.1x0.1 Measurements L x W x D (cm) 0.848 0 0.008 A (cm) : rea 0.085 0 0.001 Volume (cm) : 86.80% 100.00% 98.40% % Reduction in Area: 86.70% 100.00% 98.00% % Reduction in Volume: Full Thickness Without Exposed Full Thickness Without Exposed Full Thickness Without Exposed Classification: Support Structures Support Structures Support Structures Medium None Present None Present Exudate Amount: Serosanguineous N/A N/A Exudate Type: red, brown N/A N/A Exudate Color: Flat and Intact Flat and Intact Flat and Intact Wound Margin: Large (67-100%) None Present (0%) None Present (0%) Granulation Amount: Red N/A N/A Granulation Quality: Small (1-33%) None Present (0%) None Present (0%) Necrotic Amount: Fat Layer (Subcutaneous Tissue): Yes Fascia: No Fascia: No Exposed Structures: Fascia: No Fat Layer (Subcutaneous Tissue): No Fat Layer (Subcutaneous Tissue): No Tendon: No Tendon: No Tendon: No Muscle: No Muscle: No Muscle: No Joint: No Joint: No Joint: No Bone: No Bone: No Bone: No Limited to Skin Breakdown Small (1-33%) Large (67-100%) Large (67-100%) Epithelialization: Compression Therapy N/A Compression Therapy Procedures Performed: Treatment Notes Wound #1 (Lower Leg) Wound Laterality: Left, Anterior Cleanser Soap and Water Discharge Instruction: May shower and wash wound with dial antibacterial  soap and water prior to dressing change. Wound Cleanser Discharge Instruction: Cleanse the wound with wound cleanser prior to applying a clean dressing using gauze sponges, not tissue or cotton balls. Peri-Wound Care Triamcinolone 15 (g) Discharge Instruction: Use triamcinolone 15 (g) as directed Zinc Oxide Ointment 30g tube Discharge Instruction: Apply Zinc Oxide to periwound with each dressing change Sween Lotion (Moisturizing lotion) Discharge Instruction: Apply moisturizing lotion as directed Topical Primary Dressing Maxorb Extra Calcium Alginate 2x2 in Discharge Instruction: Apply calcium alginate to wound bed as instructed Cutimed Sorbact Swab Discharge Instruction: Apply to wound bed as instructed Secondary Dressing ABD Pad, 8x10 Discharge Instruction: Apply over primary dressing as directed. Secured With Compression Wrap ThreePress (3 layer compression wrap) Discharge Instruction: Apply three layer compression as directed. Compression Stockings Add-Ons Wound #3 (Lower Leg) Wound Laterality: Right, Anterior Cleanser Soap and Water Discharge Instruction: May shower and wash wound with dial antibacterial soap and water prior to dressing change. Wound Cleanser Discharge Instruction: Cleanse the wound with wound cleanser prior to applying a clean dressing using gauze sponges, not tissue or cotton  balls. Peri-Wound Care Triamcinolone 15 (g) Discharge Instruction: Use triamcinolone 15 (g) as directed Zinc Oxide Ointment 30g tube Discharge Instruction: Apply Zinc Oxide to periwound with each dressing change Sween Lotion (Moisturizing lotion) Discharge Instruction: Apply moisturizing lotion as directed Topical Primary Dressing Secondary Dressing Woven Gauze Sponge, Non-Sterile 4x4 in Discharge Instruction: Apply over primary dressing as directed. Secured With Compression Wrap ThreePress (3 layer compression wrap) Discharge Instruction: Apply three layer compression as  directed. Compression Stockings Add-Ons Electronic Signature(s) Signed: 11/09/2021 11:27:48 AM By: Kalman Shan DO Signed: 11/09/2021 5:28:06 PM By: Deon Pilling RN, BSN Entered By: Kalman Shan on 11/09/2021 11:23:01 -------------------------------------------------------------------------------- [place] Details Patient Name: Date of Service: [name] [name] 11/09/2021 10:30 A M Medical Record Number: [id number] Patient Account Number: [id number] Date of Birth/Sex: Treating RN: Sep 30, 1961 (61 y.o. [name] [name], Tammi Klippel Primary Care Bevin Mayall: Fonnie Jarvis Other Clinician: Referring Emarion Toral: Treating Jiyaan Steinhauser/Extender: Dorann Ou in Treatment: 6 Active Inactive Nutrition Nursing Diagnoses: Potential for alteratiion in Nutrition/Potential for imbalanced nutrition Goals: Patient/caregiver agrees to and verbalizes understanding of need to obtain nutritional consultation Date Initiated: 09/28/2021 Target Resolution Date: 11/25/2021 Goal Status: Active Interventions: Assess patient nutrition upon admission and as needed per policy Provide education on nutrition Treatment Activities: Education provided on Nutrition : 10/08/2021 Patient referred to Primary Care Physician for further nutritional evaluation : 09/28/2021 Notes: Pain, Acute or Chronic Nursing Diagnoses: Pain, acute or chronic: actual or potential Potential alteration in comfort, pain Goals: Patient will verbalize adequate pain control and receive pain control interventions during procedures as needed Date Initiated: 09/28/2021 Target Resolution Date: 12/01/2021 Goal Status: Active Patient/caregiver will verbalize comfort level met Date Initiated: 09/28/2021 Target Resolution Date: 12/02/2021 Goal Status: Active Interventions: Encourage patient to take pain medications as prescribed Provide education on pain management Reposition patient for comfort Treatment  Activities: Administer pain control measures as ordered : 09/28/2021 Notes: Wound/Skin Impairment Nursing Diagnoses: Knowledge deficit related to ulceration/compromised skin integrity Goals: Patient/caregiver will verbalize understanding of skin care regimen Date Initiated: 09/28/2021 Target Resolution Date: 11/25/2021 Goal Status: Active Interventions: Assess patient/caregiver ability to perform ulcer/skin care regimen upon admission and as needed Assess ulceration(s) every visit Provide education on smoking Provide education on ulcer and skin care Treatment Activities: Skin care regimen initiated : 09/28/2021 Smoking cessation education : 09/28/2021 Topical wound management initiated : 09/28/2021 Notes: Electronic Signature(s) Signed: 11/09/2021 5:28:06 PM By: Deon Pilling RN, BSN Entered By: Deon Pilling on 11/09/2021 10:46:57 -------------------------------------------------------------------------------- Pain Assessment Details Patient Name: Date of Service: [name] [name] 11/09/2021 10:30 A M Medical Record Number: [id number] Patient Account Number: [id number] Date of Birth/Sex: Treating RN: [birth year] (61 y.o. [name] Primary Care Ervin Rothbauer: Fonnie Jarvis Other Clinician: Referring Lynea Rollison: Treating Larson Limones/Extender: Dorann Ou in Treatment: 6 Active Problems Location of Pain Severity and Description of Pain Patient Has Paino Yes Site Locations Pain Location: Pain Location: Generalized Pain With Dressing Change: No Duration of the Pain. Constant / Intermittento Constant Rate the pain. Current Pain Level: 10 Worst Pain Level: 10 Least Pain Level: 10 Tolerable Pain Level: 10 Character of Pain Describe the Pain: Aching Pain Management and Medication Current Pain Management: Medication: No Cold Application: No Rest: No Massage: No Activity: No T.E.N.S.: No Heat Application: No Leg drop or elevation: No Is the  Current Pain Management Adequate: Inadequate How does your wound impact your activities of daily livingo Sleep: No Bathing: No Appetite: No Relationship With Others: No Bladder Continence: No Emotions: No Bowel Continence:  No Work: No Toileting: No Drive: No Dressing: No Hobbies: No Engineer, maintenance) Signed: 11/09/2021 5:19:42 PM By: Dellie Catholic RN Entered By: Dellie Catholic on 11/09/2021 10:33:28 -------------------------------------------------------------------------------- Patient/Caregiver Education Details Patient Name: Date of Service: [name] [name] 1/3/2023andnbsp10:30 A M Medical Record Number: [id number] Patient Account Number: [id number] Date of Birth/Gender: Treating RN: 10/15/1961 (61 y.o. [name] [name] Primary Care Physician: Fonnie Jarvis Other Clinician: Referring Physician: Treating Physician/Extender: Dorann Ou in Treatment: 6 Education Assessment Education Provided To: Patient Education Topics Provided Pain: Handouts: A Guide to Pain Control Methods: Explain/Verbal Responses: Reinforcements needed Electronic Signature(s) Signed: 11/09/2021 5:28:06 PM By: Deon Pilling RN, BSN Signed: 11/09/2021 5:28:06 PM By: Deon Pilling RN, BSN Entered By: Deon Pilling on 11/09/2021 10:47:15 -------------------------------------------------------------------------------- Wound Assessment Details Patient Name: Date of Service: [name] [name] 11/09/2021 10:30 A M Medical Record Number: [id number] Patient Account Number: [id number] Date of Birth/Sex: Treating RN: 1961/03/21 (61 y.o. [name] [name] Primary Care Hanaan Gancarz: Fonnie Jarvis Other Clinician: Referring Tashara Suder: Treating Dalylah Ramey/Extender: Aloha Gell Weeks in Treatment: 6 Wound Status Wound Number: 1 Primary Venous Leg Ulcer Etiology: Wound Location: Left, Anterior Lower Leg Wound Open Wounding Event: Gradually  Appeared Status: Date Acquired: 09/14/2021 Comorbid Chronic Obstructive Pulmonary Disease (COPD), Deep Vein Weeks Of Treatment: 6 History: Thrombosis, Hypertension, Peripheral Venous Disease, Neuropathy Clustered Wound: No Photos Wound Measurements Length: (cm) 1.2 Width: (cm) 0.9 Depth: (cm) 0.1 Area: (cm) 0.848 Volume: (cm) 0.085 % Reduction in Area: 86.8% % Reduction in Volume: 86.7% Epithelialization: Small (1-33%) Wound Description Classification: Full Thickness Without Exposed Support Structures Wound Margin: Flat and Intact Exudate Amount: Medium Exudate Type: Serosanguineous Exudate Color: red, brown Foul Odor After Cleansing: No Slough/Fibrino Yes Wound Bed Granulation Amount: Large (67-100%) Exposed Structure Granulation Quality: Red Fascia Exposed: No Necrotic Amount: Small (1-33%) Fat Layer (Subcutaneous Tissue) Exposed: Yes Necrotic Quality: Adherent Slough Tendon Exposed: No Muscle Exposed: No Joint Exposed: No Bone Exposed: No Treatment Notes Wound #1 (Lower Leg) Wound Laterality: Left, Anterior Cleanser Soap and Water Discharge Instruction: May shower and wash wound with dial antibacterial soap and water prior to dressing change. Wound Cleanser Discharge Instruction: Cleanse the wound with wound cleanser prior to applying a clean dressing using gauze sponges, not tissue or cotton balls. Peri-Wound Care Triamcinolone 15 (g) Discharge Instruction: Use triamcinolone 15 (g) as directed Zinc Oxide Ointment 30g tube Discharge Instruction: Apply Zinc Oxide to periwound with each dressing change Sween Lotion (Moisturizing lotion) Discharge Instruction: Apply moisturizing lotion as directed Topical Primary Dressing Maxorb Extra Calcium Alginate 2x2 in Discharge Instruction: Apply calcium alginate to wound bed as instructed Cutimed Sorbact Swab Discharge Instruction: Apply to wound bed as instructed Secondary Dressing ABD Pad, 8x10 Discharge  Instruction: Apply over primary dressing as directed. Secured With Compression Wrap ThreePress (3 layer compression wrap) Discharge Instruction: Apply three layer compression as directed. Compression Stockings Add-Ons Electronic Signature(s) Signed: 11/09/2021 5:19:42 PM By: Dellie Catholic RN Signed: 11/09/2021 5:28:06 PM By: Deon Pilling RN, BSN Entered By: Dellie Catholic on 11/09/2021 10:38:31 -------------------------------------------------------------------------------- Wound Assessment Details Patient Name: Date of Service: [name] [name] 11/09/2021 10:30 A M Medical Record Number: [id number] Patient Account Number: [id number] Date of Birth/Sex: Treating RN: 11/12/1960 (61 y.o. [name] [name] Primary Care Arrionna Serena: Fonnie Jarvis Other Clinician: Referring Athanasia Stanwood: Treating Magdala Brahmbhatt/Extender: Aloha Gell Weeks in Treatment: 6 Wound Status Wound Number: 2 Primary Venous Leg Ulcer Etiology: Wound Location: Left, Medial Lower Leg Wound Healed - Epithelialized Wounding Event: Gradually Appeared  Status: Date Acquired: 09/14/2021 Comorbid Chronic Obstructive Pulmonary Disease (COPD), Deep Vein Weeks Of Treatment: 6 History: Thrombosis, Hypertension, Peripheral Venous Disease, Neuropathy Clustered Wound: No Photos Wound Measurements Length: (cm) Width: (cm) Depth: (cm) Area: (cm) Volume: (cm) 0 % Reduction in Area: 100% 0 % Reduction in Volume: 100% 0 Epithelialization: Large (67-100%) 0 0 Wound Description Classification: Full Thickness Without Exposed Support Structures Wound Margin: Flat and Intact Exudate Amount: None Present Foul Odor After Cleansing: No Slough/Fibrino No Wound Bed Granulation Amount: None Present (0%) Exposed Structure Necrotic Amount: None Present (0%) Fascia Exposed: No Fat Layer (Subcutaneous Tissue) Exposed: No Tendon Exposed: No Muscle Exposed: No Joint Exposed: No Bone Exposed: No Limited to Skin  Breakdown Electronic Signature(s) Signed: 11/09/2021 5:19:42 PM By: Dellie Catholic RN Signed: 11/09/2021 5:28:06 PM By: Deon Pilling RN, BSN Entered By: Dellie Catholic on 11/09/2021 10:36:30 -------------------------------------------------------------------------------- Wound Assessment Details Patient Name: Date of Service: [name] [name] 11/09/2021 10:30 A M Medical Record Number: [id number] Patient Account Number: [id number] Date of Birth/Sex: Treating RN: May 25, 1961 (61 y.o. [name] [name] Primary Care Lenny Bouchillon: Fonnie Jarvis Other Clinician: Referring Amine Adelson: Treating Farhan Jean/Extender: Aloha Gell Weeks in Treatment: 6 Wound Status Wound Number: 3 Primary Venous Leg Ulcer Etiology: Wound Location: Right, Anterior Lower Leg Wound Open Wounding Event: Gradually Appeared Status: Date Acquired: 09/14/2021 Comorbid Chronic Obstructive Pulmonary Disease (COPD), Deep Vein Weeks Of Treatment: 6 History: Thrombosis, Hypertension, Peripheral Venous Disease, Neuropathy Clustered Wound: No Photos Wound Measurements Length: (cm) 0.1 Width: (cm) 0.1 Depth: (cm) 0.1 Area: (cm) 0.008 Volume: (cm) 0.001 Wound Description Classification: Full Thickness Without Exposed Support Structu Wound Margin: Flat and Intact Exudate Amount: None Present Foul Odor After Cleansing: Slough/Fibrino % Reduction in Area: 98.4% % Reduction in Volume: 98% Epithelialization: Large (67-100%) res No Yes Wound Bed Granulation Amount: None Present (0%) Exposed Structure Necrotic Amount: None Present (0%) Fascia Exposed: No Fat Layer (Subcutaneous Tissue) Exposed: No Tendon Exposed: No Muscle Exposed: No Joint Exposed: No Bone Exposed: No Treatment Notes Wound #3 (Lower Leg) Wound Laterality: Right, Anterior Cleanser Soap and Water Discharge Instruction: May shower and wash wound with dial antibacterial soap and water prior to dressing change. Wound  Cleanser Discharge Instruction: Cleanse the wound with wound cleanser prior to applying a clean dressing using gauze sponges, not tissue or cotton balls. Peri-Wound Care Triamcinolone 15 (g) Discharge Instruction: Use triamcinolone 15 (g) as directed Zinc Oxide Ointment 30g tube Discharge Instruction: Apply Zinc Oxide to periwound with each dressing change Sween Lotion (Moisturizing lotion) Discharge Instruction: Apply moisturizing lotion as directed Topical Primary Dressing Secondary Dressing Woven Gauze Sponge, Non-Sterile 4x4 in Discharge Instruction: Apply over primary dressing as directed. Secured With Compression Wrap ThreePress (3 layer compression wrap) Discharge Instruction: Apply three layer compression as directed. Compression Stockings Add-Ons Electronic Signature(s) Signed: 11/09/2021 5:19:42 PM By: Dellie Catholic RN Signed: 11/09/2021 5:28:06 PM By: Deon Pilling RN, BSN Entered By: Dellie Catholic on 11/09/2021 10:37:23 -------------------------------------------------------------------------------- Vitals Details Patient Name: Date of Service: [name] [name] 11/09/2021 10:30 A M Medical Record Number: [id number] Patient Account Number: [id number] Date of Birth/Sex: Treating RN: [birth year] (61 y.o. [name] Primary Care Macaulay Reicher: Fonnie Jarvis Other Clinician: Referring Yassen Kinnett: Treating Lavana Huckeba/Extender: Dorann Ou in Treatment: 6 Vital Signs Time Taken: 10:27 Temperature (F): 98.2 Height (in): 71 Pulse (bpm): 53 Weight (lbs): 216 Respiratory Rate (breaths/min): 18 Body Mass Index (BMI): 30.1 Blood Pressure (mmHg): 137/83 Reference Range: 80 - 120 mg / dl Electronic Signature(s) Signed: 11/09/2021  5:19:42 PM By: Dellie Catholic RN Entered By: Dellie Catholic on 11/09/2021 10:32:43

## 2021-11-09 NOTE — Progress Notes (Signed)
USMAN, MILLETT ([id number]) Visit Report for 11/09/2021 Chief Complaint Document Details Patient Name: Date of Service: [name] [name] 11/09/2021 10:30 A M Medical Record Number: [id number] Patient Account Number: [id number] Date of Birth/Sex: Treating RN: Feb 10, 1961 (61 [name].o. [name] Primary Care Provider: Fonnie Jarvis Other Clinician: Referring Provider: Treating Provider/Extender: Dorann Ou in Treatment: 6 Information Obtained from: Patient Chief Complaint Bilateral lower extremity wounds Electronic Signature(s) Signed: 11/09/2021 11:27:48 AM By: Kalman Shan DO Entered By: Kalman Shan on 11/09/2021 11:23:17 -------------------------------------------------------------------------------- HPI Details Patient Name: Date of Service: [name] [name] 11/09/2021 10:30 A M Medical Record Number: [id number] Patient Account Number: [id number] Date of Birth/Sex: Treating RN: 1961-07-30 (89 [name].o. [name] Primary Care Provider: Fonnie Jarvis Other Clinician: Referring Provider: Treating Provider/Extender: Dorann Ou in Treatment: 6 History of Present Illness HPI Description: Admission 09/28/2021 Mr. 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 He states the wounds have been present for the past 6 months and wax and wane in healing. He has been seen by vein and vascular for this issue. He has been treated with Unna boots with benefit. He states he started compression stockings when there was 1 wound remaining and now has developed multiple wounds. He currently takes 20 mg of Lasix and has been doing so for years. He states he has been on several rounds of antibiotics for this issue. He last took Bactrim and completed this a couple  days ago. He currently denies signs of infection. 11/29; patient presents for 1 week follow-up. He states that 4 days ago he felt burning to his legs bilaterally and removed the compression wraps. He saw vein and vascular yesterday and was placed in an Unna boot to the left leg. T oday He reports increased redness to the left leg. 12/6; patient presents for follow-up. He has no issues or complaints today. He completed his antibiotics and reports improvement in symptoms. At the last nurse visit since he had done well with Kerlix and Coban he was increased to 3 layer compression wrap. Over the past few days he has tolerated this well. He currently denies signs of infection. 12/13; patient presents for follow-up. He has no issues or complaints today. He has tolerated the 3 layer compression wrap well. He denies signs of infection. 12/20; patient presents for follow-up. He took the compression wraps off yesterday. He wanted to shower. He denies signs of infection. 1/3; patient presents for follow-up. Has no issues or complaints today. Electronic Signature(s) Signed: 11/09/2021 11:27:48 AM By: Kalman Shan DO Entered By: Kalman Shan on 11/09/2021 11:23:44 -------------------------------------------------------------------------------- Physical Exam Details Patient Name: Date of Service: [name] [name] 11/09/2021 10:30 A M Medical Record Number: [id number] Patient Account Number: [id number] Date of Birth/Sex: Treating RN: 1960/11/30 (39 [name].o. [name] Primary Care Provider: Fonnie Jarvis Other Clinician: Referring Provider: Treating Provider/Extender: Dorann Ou in Treatment: 6 Constitutional respirations regular, non-labored and within target range for patient.. Cardiovascular 2+ dorsalis pedis/posterior tibialis pulses. Psychiatric pleasant and cooperative. Notes 2 wounds remaining 1 on each leg. They appear well-healing with granulation tissue  present. No signs of infection. Venous stasis dermatitis bilaterally. Eyes varicose veins noted. Electronic Signature(s) Signed: 11/09/2021 11:27:48 AM By: Kalman Shan DO Entered By: Kalman Shan on 11/09/2021 11:24:43 -------------------------------------------------------------------------------- Physician Orders Details Patient Name:  Date of Service: [name] [name] 11/09/2021 10:30 A M Medical Record Number: [id number] Patient Account Number: [id number] Date of Birth/Sex: Treating RN: 11-13-1960 (77 [name].o. [name], [name] Primary Care Provider: Fonnie Jarvis Other Clinician: Referring Provider: Treating Provider/Extender: Dorann Ou in Treatment: 6 Verbal / Phone Orders: No Diagnosis Coding ICD-10 Coding Code Description I87.313 Chronic venous hypertension (idiopathic) with ulcer of bilateral lower extremity L97.812 Non-pressure chronic ulcer of other part of right lower leg with fat layer exposed L97.822 Non-pressure chronic ulcer of other part of left lower leg with fat layer exposed F17.210 Nicotine dependence, cigarettes, uncomplicated E08 Essential (primary) hypertension J44.9 Chronic obstructive pulmonary disease, unspecified Follow-up Appointments ppointment in 2 weeks. - Dr. Heber [place] Tuesday Return A Nurse Visit: - Tuesday next week- ***patient to purchase compression stockings and bring in on nurse visit for nurses to show to apply to right leg. Bathing/ Shower/ Hygiene May shower with protection but do not get wound dressing(s) wet. - ***Do not remove the wraps.*** At appt times will remove wraps and wash legs.*** Edema Control - Lymphedema / SCD / Other Elevate legs to the level of the heart or above for 30 minutes daily and/or when sitting, a frequency of: - 3-4 times a [place] throughout the [place]. Avoid standing for long periods of time. Exercise regularly Additional Orders / Instructions Stop/Decrease Smoking Wound  Treatment Wound #1 - Lower Leg Wound Laterality: Left, Anterior Cleanser: Soap and Water 1 x Per Week Discharge Instructions: May shower and wash wound with dial antibacterial soap and water prior to dressing change. Cleanser: Wound Cleanser 1 x Per Week Discharge Instructions: Cleanse the wound with wound cleanser prior to applying a clean dressing using gauze sponges, not tissue or cotton balls. Peri-Wound Care: Triamcinolone 15 (g) 1 x Per Week Discharge Instructions: Use triamcinolone 15 (g) as directed Peri-Wound Care: Zinc Oxide Ointment 30g tube 1 x Per Week Discharge Instructions: Apply Zinc Oxide to periwound with each dressing change Peri-Wound Care: Sween Lotion (Moisturizing lotion) 1 x Per Week Discharge Instructions: Apply moisturizing lotion as directed Prim Dressing: Maxorb Extra Calcium Alginate 2x2 in 1 x Per Week ary Discharge Instructions: Apply calcium alginate to wound bed as instructed Prim Dressing: Cutimed Sorbact Swab 1 x Per Week ary Discharge Instructions: Apply to wound bed as instructed Secondary Dressing: ABD Pad, 8x10 1 x Per Week Discharge Instructions: Apply over primary dressing as directed. Compression Wrap: ThreePress (3 layer compression wrap) 1 x Per Week Discharge Instructions: Apply three layer compression as directed. Wound #3 - Lower Leg Wound Laterality: Right, Anterior Cleanser: Soap and Water 1 x Per Week Discharge Instructions: May shower and wash wound with dial antibacterial soap and water prior to dressing change. Cleanser: Wound Cleanser 1 x Per Week Discharge Instructions: Cleanse the wound with wound cleanser prior to applying a clean dressing using gauze sponges, not tissue or cotton balls. Peri-Wound Care: Triamcinolone 15 (g) 1 x Per Week Discharge Instructions: Use triamcinolone 15 (g) as directed Peri-Wound Care: Zinc Oxide Ointment 30g tube 1 x Per Week Discharge Instructions: Apply Zinc Oxide to periwound with each dressing  change Peri-Wound Care: Sween Lotion (Moisturizing lotion) 1 x Per Week Discharge Instructions: Apply moisturizing lotion as directed Secondary Dressing: Woven Gauze Sponge, Non-Sterile 4x4 in 1 x Per Week Discharge Instructions: Apply over primary dressing as directed. Compression Wrap: ThreePress (3 layer compression wrap) 1 x Per Week Discharge Instructions: Apply three layer compression as directed. Electronic Signature(s) Signed: 11/09/2021 11:27:48 AM By: Heber [place],  Janett Billow DO Entered By: Kalman Shan on 11/09/2021 11:25:06 -------------------------------------------------------------------------------- Problem List Details Patient Name: Date of Service: [name] [name] 11/09/2021 10:30 A M Medical Record Number: [id number] Patient Account Number: [id number] Date of Birth/Sex: Treating RN: 12-Feb-1961 (8 [name].o. [name], [name] Primary Care Provider: Fonnie Jarvis Other Clinician: Referring Provider: Treating Provider/Extender: Dorann Ou in Treatment: 6 Active Problems ICD-10 Encounter Code Description Active Date MDM Diagnosis I87.313 Chronic venous hypertension (idiopathic) with ulcer of bilateral lower extremity 09/28/2021 No Yes L97.812 Non-pressure chronic ulcer of other part of right lower leg with fat layer 09/28/2021 No Yes exposed L97.822 Non-pressure chronic ulcer of other part of left lower leg with fat layer exposed11/22/2022 No Yes F17.210 Nicotine dependence, cigarettes, uncomplicated 12/45/8099 No Yes I10 Essential (primary) hypertension 09/28/2021 No Yes J44.9 Chronic obstructive pulmonary disease, unspecified 09/28/2021 No Yes Inactive Problems Resolved Problems Electronic Signature(s) Signed: 11/09/2021 11:27:48 AM By: Kalman Shan DO Entered By: Kalman Shan on 11/09/2021 11:22:49 -------------------------------------------------------------------------------- Progress Note Details Patient Name: Date of  Service: [name] [name], [name] [name] 11/09/2021 10:30 A M Medical Record Number: [id number] Patient Account Number: [id number] Date of Birth/Sex: Treating RN: 27-Feb-1961 (70 [name].o. [name] Primary Care Provider: Fonnie Jarvis Other Clinician: Referring Provider: Treating Provider/Extender: Dorann Ou in Treatment: 6 Subjective Chief Complaint Information obtained from Patient Bilateral lower extremity wounds History of Present Illness (HPI) Admission 09/28/2021 Mr. 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 He states the wounds have been present for the past 6 months and wax and wane in healing. He has been seen by vein and vascular for this issue. He has been treated with Unna boots with benefit. He states he started compression stockings when there was 1 wound remaining and now has developed multiple wounds. He currently takes 20 mg of Lasix and has been doing so for years. He states he has been on several rounds of antibiotics for this issue. He last took Bactrim and completed this a couple days ago. He currently denies signs of infection. 11/29; patient presents for 1 week follow-up. He states that 4 days ago he felt burning to his legs bilaterally and removed the compression wraps. He saw vein and vascular yesterday and was placed in an Unna boot to the left leg. T oday He reports increased redness to the left leg. 12/6; patient presents for follow-up. He has no issues or complaints today. He completed his antibiotics and reports improvement in symptoms. At the last nurse visit since he had done well with Kerlix and Coban he was increased to 3 layer compression wrap. Over the past few days he has tolerated this well. He currently denies signs of infection. 12/13; patient  presents for follow-up. He has no issues or complaints today. He has tolerated the 3 layer compression wrap well. He denies signs of infection. 12/20; patient presents for follow-up. He took the compression wraps off yesterday. He wanted to shower. He denies signs of infection. 1/3; patient presents for follow-up. Has no issues or complaints today. Patient History Information obtained from Patient, Chart. Family History Cancer - Mother, Diabetes - Father,Siblings, Heart Disease - Mother, Hypertension - Mother, No family history of Kidney Disease, Lung Disease, Seizures, Stroke, Thyroid Problems, Tuberculosis. Social History Current every [place] smoker - 1 1/2 ppd, Marital Status - Divorced, Alcohol Use -  Never, Drug Use - No History, Caffeine Use - Daily - coffee. Medical History Eyes Denies history of Cataracts, Glaucoma, Optic Neuritis Ear/Nose/Mouth/Throat Denies history of Chronic sinus problems/congestion, Middle ear problems Hematologic/Lymphatic Denies history of Anemia, Hemophilia, Human Immunodeficiency Virus, Lymphedema, Sickle Cell Disease Respiratory Patient has history of Chronic Obstructive Pulmonary Disease (COPD) Denies history of Aspiration, Asthma, Pneumothorax, Sleep Apnea, Tuberculosis Cardiovascular Patient has history of Deep Vein Thrombosis, Hypertension, Peripheral Venous Disease Denies history of Angina, Arrhythmia, Congestive Heart Failure, Coronary Artery Disease, Hypotension, Myocardial Infarction, Peripheral Arterial Disease, Phlebitis, Vasculitis Gastrointestinal Denies history of Cirrhosis , Colitis, Crohnoos, Hepatitis A, Hepatitis B, Hepatitis C Endocrine Denies history of Type I Diabetes, Type II Diabetes Genitourinary Denies history of End Stage Renal Disease Immunological Denies history of Lupus Erythematosus, Raynaudoos, Scleroderma Integumentary (Skin) Denies history of History of Burn Musculoskeletal Denies history of Gout, Rheumatoid  Arthritis, Osteoarthritis, Osteomyelitis Neurologic Patient has history of Neuropathy - left sciatic nerve pain Denies history of Dementia, Quadriplegia, Paraplegia, Seizure Disorder Oncologic Denies history of Received Chemotherapy, Received Radiation Psychiatric Denies history of Anorexia/bulimia, Confinement Anxiety Hospitalization/Surgery History - Spinal Surgery over 20 years ago. Medical A Surgical History Notes nd Constitutional Symptoms (General Health) Stroke 2016 Migraine Cardiovascular PE Psychiatric Anxiety Objective Constitutional respirations regular, non-labored and within target range for patient.. Vitals Time Taken: 10:27 AM, Height: 71 in, Weight: 216 lbs, BMI: 30.1, Temperature: 98.2 F, Pulse: 53 bpm, Respiratory Rate: 18 breaths/min, Blood Pressure: 137/83 mmHg. Cardiovascular 2+ dorsalis pedis/posterior tibialis pulses. Psychiatric pleasant and cooperative. General Notes: 2 wounds remaining 1 on each leg. They appear well-healing with granulation tissue present. No signs of infection. Venous stasis dermatitis bilaterally. Eyes varicose veins noted. Integumentary (Hair, Skin) Wound #1 status is Open. Original cause of wound was Gradually Appeared. The date acquired was: 09/14/2021. The wound has been in treatment 6 weeks. The wound is located on the Left,Anterior Lower Leg. The wound measures 1.2cm length x 0.9cm width x 0.1cm depth; 0.848cm^2 area and 0.085cm^3 volume. There is Fat Layer (Subcutaneous Tissue) exposed. There is a medium amount of serosanguineous drainage noted. The wound margin is flat and intact. There is large (67-100%) red granulation within the wound bed. There is a small (1-33%) amount of necrotic tissue within the wound bed including Adherent Slough. Wound #2 status is Healed - Epithelialized. Original cause of wound was Gradually Appeared. The date acquired was: 09/14/2021. The wound has been in treatment 6 weeks. The wound is located on  the Left,Medial Lower Leg. The wound measures 0cm length x 0cm width x 0cm depth; 0cm^2 area and 0cm^3 volume. The wound is limited to skin breakdown. There is a none present amount of drainage noted. The wound margin is flat and intact. There is no granulation within the wound bed. There is no necrotic tissue within the wound bed. Wound #3 status is Open. Original cause of wound was Gradually Appeared. The date acquired was: 09/14/2021. The wound has been in treatment 6 weeks. The wound is located on the Right,Anterior Lower Leg. The wound measures 0.1cm length x 0.1cm width x 0.1cm depth; 0.008cm^2 area and 0.001cm^3 volume. There is a none present amount of drainage noted. The wound margin is flat and intact. There is no granulation within the wound bed. There is no necrotic tissue within the wound bed. Assessment Active Problems ICD-10 Chronic venous hypertension (idiopathic) with ulcer of bilateral lower extremity Non-pressure chronic ulcer of other part of right lower leg with fat layer exposed Non-pressure chronic ulcer of other  part of left lower leg with fat layer exposed Nicotine dependence, cigarettes, uncomplicated Essential (primary) hypertension Chronic obstructive pulmonary disease, unspecified Patient has done well with silver alginate and compression wraps. The right leg wound is almost healed. No signs of infection on exam. We gave him information on obtaining compression stockings. We will continue to use silver alginate and wrap both legs today. Procedures Wound #1 Pre-procedure diagnosis of Wound #1 is a Venous Leg Ulcer located on the Left,Anterior Lower Leg . There was a Three Layer Compression Therapy Procedure by Deon Pilling, RN. Post procedure Diagnosis Wound #1: Same as Pre-Procedure Wound #3 Pre-procedure diagnosis of Wound #3 is a Venous Leg Ulcer located on the Right,Anterior Lower Leg . There was a Three Layer Compression Therapy Procedure by Deon Pilling,  RN. Post procedure Diagnosis Wound #3: Same as Pre-Procedure Plan Follow-up Appointments: Return Appointment in 2 weeks. - Dr. Heber [place] Tuesday Nurse Visit: - Tuesday next week- ***patient to purchase compression stockings and bring in on nurse visit for nurses to show to apply to right leg. Bathing/ Shower/ Hygiene: May shower with protection but do not get wound dressing(s) wet. - ***Do not remove the wraps.*** At appt times will remove wraps and wash legs.*** Edema Control - Lymphedema / SCD / Other: Elevate legs to the level of the heart or above for 30 minutes daily and/or when sitting, a frequency of: - 3-4 times a [place] throughout the [place]. Avoid standing for long periods of time. Exercise regularly Additional Orders / Instructions: Stop/Decrease Smoking WOUND #1: - Lower Leg Wound Laterality: Left, Anterior Cleanser: Soap and Water 1 x Per Week/ Discharge Instructions: May shower and wash wound with dial antibacterial soap and water prior to dressing change. Cleanser: Wound Cleanser 1 x Per Week/ Discharge Instructions: Cleanse the wound with wound cleanser prior to applying a clean dressing using gauze sponges, not tissue or cotton balls. Peri-Wound Care: Triamcinolone 15 (g) 1 x Per Week/ Discharge Instructions: Use triamcinolone 15 (g) as directed Peri-Wound Care: Zinc Oxide Ointment 30g tube 1 x Per Week/ Discharge Instructions: Apply Zinc Oxide to periwound with each dressing change Peri-Wound Care: Sween Lotion (Moisturizing lotion) 1 x Per Week/ Discharge Instructions: Apply moisturizing lotion as directed Prim Dressing: Maxorb Extra Calcium Alginate 2x2 in 1 x Per Week/ ary Discharge Instructions: Apply calcium alginate to wound bed as instructed Prim Dressing: Cutimed Sorbact Swab 1 x Per Week/ ary Discharge Instructions: Apply to wound bed as instructed Secondary Dressing: ABD Pad, 8x10 1 x Per Week/ Discharge Instructions: Apply over primary dressing as directed. Com  pression Wrap: ThreePress (3 layer compression wrap) 1 x Per Week/ Discharge Instructions: Apply three layer compression as directed. WOUND #3: - Lower Leg Wound Laterality: Right, Anterior Cleanser: Soap and Water 1 x Per Week/ Discharge Instructions: May shower and wash wound with dial antibacterial soap and water prior to dressing change. Cleanser: Wound Cleanser 1 x Per Week/ Discharge Instructions: Cleanse the wound with wound cleanser prior to applying a clean dressing using gauze sponges, not tissue or cotton balls. Peri-Wound Care: Triamcinolone 15 (g) 1 x Per Week/ Discharge Instructions: Use triamcinolone 15 (g) as directed Peri-Wound Care: Zinc Oxide Ointment 30g tube 1 x Per Week/ Discharge Instructions: Apply Zinc Oxide to periwound with each dressing change Peri-Wound Care: Sween Lotion (Moisturizing lotion) 1 x Per Week/ Discharge Instructions: Apply moisturizing lotion as directed Secondary Dressing: Woven Gauze Sponge, Non-Sterile 4x4 in 1 x Per Week/ Discharge Instructions: Apply over primary dressing as directed. Com  pression Wrap: ThreePress (3 layer compression wrap) 1 x Per Week/ Discharge Instructions: Apply three layer compression as directed. 1. Silver alginate under 3 layer compression 2. Follow-up in 1 week for nurse visit in 2 weeks with me Electronic Signature(s) Signed: 11/09/2021 11:27:48 AM By: Kalman Shan DO Entered By: Kalman Shan on 11/09/2021 11:26:49 -------------------------------------------------------------------------------- HxROS Details Patient Name: Date of Service: [name] [name], [name] [name] 11/09/2021 10:30 A M Medical Record Number: [id number] Patient Account Number: [id number] Date of Birth/Sex: Treating RN: August 26, 1961 (59 [name].o. [name] Primary Care Provider: Fonnie Jarvis Other Clinician: Referring Provider: Treating Provider/Extender: Dorann Ou in Treatment: 6 Information Obtained From Patient  Chart Constitutional Symptoms (General Health) Medical History: Past Medical History Notes: Stroke 2016 Migraine Eyes Medical History: Negative for: Cataracts; Glaucoma; Optic Neuritis Ear/Nose/Mouth/Throat Medical History: Negative for: Chronic sinus problems/congestion; Middle ear problems Hematologic/Lymphatic Medical History: Negative for: Anemia; Hemophilia; Human Immunodeficiency Virus; Lymphedema; Sickle Cell Disease Respiratory Medical History: Positive for: Chronic Obstructive Pulmonary Disease (COPD) Negative for: Aspiration; Asthma; Pneumothorax; Sleep Apnea; Tuberculosis Cardiovascular Medical History: Positive for: Deep Vein Thrombosis; Hypertension; Peripheral Venous Disease Negative for: Angina; Arrhythmia; Congestive Heart Failure; Coronary Artery Disease; Hypotension; Myocardial Infarction; Peripheral Arterial Disease; Phlebitis; Vasculitis Past Medical History Notes: PE Gastrointestinal Medical History: Negative for: Cirrhosis ; Colitis; Crohns; Hepatitis A; Hepatitis B; Hepatitis C Endocrine Medical History: Negative for: Type I Diabetes; Type II Diabetes Genitourinary Medical History: Negative for: End Stage Renal Disease Immunological Medical History: Negative for: Lupus Erythematosus; Raynauds; Scleroderma Integumentary (Skin) Medical History: Negative for: History of Burn Musculoskeletal Medical History: Negative for: Gout; Rheumatoid Arthritis; Osteoarthritis; Osteomyelitis Neurologic Medical History: Positive for: Neuropathy - left sciatic nerve pain Negative for: Dementia; Quadriplegia; Paraplegia; Seizure Disorder Oncologic Medical History: Negative for: Received Chemotherapy; Received Radiation Psychiatric Medical History: Negative for: Anorexia/bulimia; Confinement Anxiety Past Medical History Notes: Anxiety Immunizations Pneumococcal Vaccine: Received Pneumococcal Vaccination: No Implantable Devices No devices  added Hospitalization / Surgery History Type of Hospitalization/Surgery Spinal Surgery over 20 years ago Family and Social History Cancer: Yes - Mother; Diabetes: Yes - Father,Siblings; Heart Disease: Yes - Mother; Hypertension: Yes - Mother; Kidney Disease: No; Lung Disease: No; Seizures: No; Stroke: No; Thyroid Problems: No; Tuberculosis: No; Current every [place] smoker - 1 1/2 ppd; Marital Status - Divorced; Alcohol Use: Never; Drug Use: No History; Caffeine Use: Daily - coffee; Financial Concerns: No; Food, Clothing or Shelter Needs: No; Support System Lacking: No; Transportation Concerns: No Electronic Signature(s) Signed: 11/09/2021 11:27:48 AM By: Kalman Shan DO Signed: 11/09/2021 5:28:06 PM By: Deon Pilling RN, BSN Signed: 11/09/2021 5:28:06 PM By: Deon Pilling RN, BSN Entered By: Kalman Shan on 11/09/2021 11:23:55 -------------------------------------------------------------------------------- SuperBill Details Patient Name: Date of Service: [name] [name] 11/09/2021 Medical Record Number: [id number] Patient Account Number: [id number] Date of Birth/Sex: Treating RN: 1961/01/31 (33 [name].o. [name], [name] Primary Care Provider: Fonnie Jarvis Other Clinician: Referring Provider: Treating Provider/Extender: Dorann Ou in Treatment: 6 Diagnosis Coding ICD-10 Codes Code Description [phone] Chronic venous hypertension (idiopathic) with ulcer of bilateral lower extremity L97.812 Non-pressure chronic ulcer of other part of right lower leg with fat layer exposed L97.822 Non-pressure chronic ulcer of other part of left lower leg with fat layer exposed F17.210 Nicotine dependence, cigarettes, uncomplicated I77 Essential (primary) hypertension J44.9 Chronic obstructive pulmonary disease, unspecified Facility Procedures CPT4: Code [id number] Description: 81 BILATERAL: Application of multi-layer venous compression system; leg (below knee),  including ankle and t. Modifier: Quantity: 1 Physician Procedures : [id number]  Code Description Modifier [id number] - WC PHYS LEVEL 3 - EST PT ICD-10 Diagnosis Description I87.313 Chronic venous hypertension (idiopathic) with ulcer of bilateral lower extremity L97.812 Non-pressure chronic ulcer of other part of right  lower leg with fat layer exposed L97.822 Non-pressure chronic ulcer of other part of left lower leg with fat layer exposed J44.9 Chronic obstructive pulmonary disease, unspecified Quantity: 1 Electronic Signature(s) Signed: 11/09/2021 11:27:48 AM By: Kalman Shan DO Entered By: Kalman Shan on 11/09/2021 11:27:28

## 2021-11-16 ENCOUNTER — Other Ambulatory Visit: Payer: Self-pay

## 2021-11-16 ENCOUNTER — Encounter (HOSPITAL_BASED_OUTPATIENT_CLINIC_OR_DEPARTMENT_OTHER): Payer: Medicaid Other | Admitting: Internal Medicine

## 2021-11-16 DIAGNOSIS — Z7689 Persons encountering health services in other specified circumstances: Secondary | ICD-10-CM | POA: Diagnosis not present

## 2021-11-16 DIAGNOSIS — F17210 Nicotine dependence, cigarettes, uncomplicated: Secondary | ICD-10-CM | POA: Diagnosis not present

## 2021-11-16 DIAGNOSIS — L97822 Non-pressure chronic ulcer of other part of left lower leg with fat layer exposed: Secondary | ICD-10-CM | POA: Diagnosis not present

## 2021-11-16 DIAGNOSIS — J449 Chronic obstructive pulmonary disease, unspecified: Secondary | ICD-10-CM | POA: Diagnosis not present

## 2021-11-16 DIAGNOSIS — G629 Polyneuropathy, unspecified: Secondary | ICD-10-CM | POA: Diagnosis not present

## 2021-11-16 DIAGNOSIS — I87313 Chronic venous hypertension (idiopathic) with ulcer of bilateral lower extremity: Secondary | ICD-10-CM | POA: Diagnosis not present

## 2021-11-16 DIAGNOSIS — I10 Essential (primary) hypertension: Secondary | ICD-10-CM | POA: Diagnosis not present

## 2021-11-16 DIAGNOSIS — L97812 Non-pressure chronic ulcer of other part of right lower leg with fat layer exposed: Secondary | ICD-10-CM | POA: Diagnosis not present

## 2021-11-16 DIAGNOSIS — Z86718 Personal history of other venous thrombosis and embolism: Secondary | ICD-10-CM | POA: Diagnosis not present

## 2021-11-17 NOTE — Progress Notes (Signed)
ATHOL, BOLDS ([id number]) Visit Report for 11/16/2021 Arrival Information Details Patient Name: Date of Service: [name] [name] 11/16/2021 1:30 PM Medical Record Number: [id number] Patient Account Number: [id number] Date of Birth/Sex: Treating RN: [birth year] (61 y.o. [name] [name] Primary Care Lillyn Wieczorek: Fonnie Jarvis Other Clinician: Referring Taquilla Downum: Treating [name] [name]/Extender: Dorann Ou in Treatment: 7 Visit Information History Since Last Visit Added or deleted any medications: No Patient Arrived: [name] [name] Any new allergies or adverse reactions: No Arrival Time: 13:28 Had a fall or experienced change in Yes Accompanied By: self activities of daily living that may affect Transfer Assistance: None risk of falls: Patient Requires Transmission-Based Precautions: No Signs or symptoms of abuse/neglect since last visito No Patient Has Alerts: Yes Hospitalized since last visit: No Patient Alerts: Patient on Blood Thinner Implantable device outside of the clinic excluding No cellular tissue based products placed in the center since last visit: Has Dressing in Place as Prescribed: Yes Has Compression in Place as Prescribed: Yes Pain Present Now: Yes Notes Pt. stated that he fell this week at home Electronic Signature(s) Signed: 11/17/2021 5:20:15 PM By: Dellie Catholic RN Entered By: Dellie Catholic on 11/16/2021 13:30:34 -------------------------------------------------------------------------------- Encounter Discharge Information Details Patient Name: Date of Service: [name] [name] 11/16/2021 1:30 PM Medical Record Number: [id number] Patient Account Number: [id number] Date of Birth/Sex: Treating RN: [birth year] (61 y.o. [name] [name] Primary Care Wilburta Milbourn: Fonnie Jarvis Other Clinician: Referring Jalon Blackwelder: Treating [name] [name]/Extender: Dorann Ou in Treatment: 7 Encounter Discharge Information  Items Discharge Condition: Stable Ambulatory Status: Walker Discharge Destination: Home Transportation: Private Auto Schedule Follow-up Appointment: Yes Clinical Summary of Care: Patient Declined Electronic Signature(s) Signed: 11/17/2021 5:20:15 PM By: Dellie Catholic RN Entered By: Dellie Catholic on 11/16/2021 14:53:02 -------------------------------------------------------------------------------- Patient/Caregiver Education Details Patient Name: Date of Service: [name] [name] 1/10/2023andnbsp1:30 PM Medical Record Number: [id number] Patient Account Number: [id number] Date of Birth/Gender: Treating RN: [birth year] (60 y.o. [name] [name] Primary Care Physician: Fonnie Jarvis Other Clinician: Referring Physician: Treating Physician/Extender: Dorann Ou in Treatment: 7 Education Assessment Education Provided To: Patient Education Topics Provided Nutrition: Methods: Explain/Verbal Responses: Return demonstration correctly Pain: Methods: Explain/Verbal Responses: Return demonstration correctly Smoking and Wound Healing: Methods: Explain/Verbal Responses: Return demonstration correctly Wound/Skin Impairment: Electronic Signature(s) Signed: 11/17/2021 5:20:15 PM By: Dellie Catholic RN Entered By: Dellie Catholic on 11/16/2021 14:52:22 -------------------------------------------------------------------------------- Wound Assessment Details Patient Name: Date of Service: [name] [name] 11/16/2021 1:30 PM Medical Record Number: [id number] Patient Account Number: [id number] Date of Birth/Sex: Treating RN: [birth year] (61 y.o. [name] [name] Primary Care Johnnae Impastato: Fonnie Jarvis Other Clinician: Referring Hanne Kegg: Treating [name] [name]/Extender: Aloha Gell Weeks in Treatment: 7 Wound Status Wound Number: 1 Primary Venous Leg Ulcer Etiology: Wound Location: Left, Anterior Lower Leg Wound Open Wounding Event:  Gradually Appeared Status: Date Acquired: 09/14/2021 Comorbid Chronic Obstructive Pulmonary Disease (COPD), Deep Vein Weeks Of Treatment: 7 History: Thrombosis, Hypertension, Peripheral Venous Disease, Clustered Wound: No Neuropathy Wound Measurements Length: (cm) 1.2 Width: (cm) 0.9 Depth: (cm) 0.1 Area: (cm) 0.848 Volume: (cm) 0.085 % Reduction in Area: 86.8% % Reduction in Volume: 86.7% Epithelialization: Small (1-33%) Wound Description Classification: Full Thickness Without Exposed Support Structures Wound Margin: Flat and Intact Exudate Amount: Medium Exudate Type: Serosanguineous Exudate Color: red, brown Foul Odor After Cleansing: No Slough/Fibrino Yes Wound Bed Granulation Amount: Large (67-100%) Exposed Structure Granulation Quality: Red Fascia Exposed: No Necrotic Amount: Small (1-33%) Fat Layer (Subcutaneous Tissue) Exposed: Yes Necrotic Quality: Adherent Slough Tendon Exposed:  No Muscle Exposed: No Joint Exposed: No Bone Exposed: No Treatment Notes Wound #1 (Lower Leg) Wound Laterality: Left, Anterior Cleanser Soap and Water Discharge Instruction: May shower and wash wound with dial antibacterial soap and water prior to dressing change. Wound Cleanser Discharge Instruction: Cleanse the wound with wound cleanser prior to applying a clean dressing using gauze sponges, not tissue or cotton balls. Peri-Wound Care Triamcinolone 15 (g) Discharge Instruction: Use triamcinolone 15 (g) as directed Zinc Oxide Ointment 30g tube Discharge Instruction: Apply Zinc Oxide to periwound with each dressing change Sween Lotion (Moisturizing lotion) Discharge Instruction: Apply moisturizing lotion as directed Topical Primary Dressing Maxorb Extra Calcium Alginate 2x2 in Discharge Instruction: Apply calcium alginate to wound bed as instructed Cutimed Sorbact Swab Discharge Instruction: Apply to wound bed as instructed Secondary Dressing ABD Pad, 8x10 Discharge  Instruction: Apply over primary dressing as directed. Secured With Compression Wrap ThreePress (3 layer compression wrap) Discharge Instruction: Apply three layer compression as directed. Compression Stockings Add-Ons Electronic Signature(s) Signed: 11/17/2021 5:20:15 PM By: Dellie Catholic RN Entered By: Dellie Catholic on 11/16/2021 13:36:54 -------------------------------------------------------------------------------- Wound Assessment Details Patient Name: Date of Service: [name] [name] 11/16/2021 1:30 PM Medical Record Number: [id number] Patient Account Number: [id number] Date of Birth/Sex: Treating RN: 1961-05-28 (61 y.o. [name] [name] Primary Care Shirline Kendle: Fonnie Jarvis Other Clinician: Referring Mishka Stegemann: Treating Shalonda Sachse/Extender: Aloha Gell Weeks in Treatment: 7 Wound Status Wound Number: 3 Primary Venous Leg Ulcer Etiology: Wound Location: Right, Anterior Lower Leg Wound Open Wounding Event: Gradually Appeared Status: Date Acquired: 09/14/2021 Comorbid Chronic Obstructive Pulmonary Disease (COPD), Deep Vein Weeks Of Treatment: 7 History: Thrombosis, Hypertension, Peripheral Venous Disease, Clustered Wound: No Neuropathy Wound Measurements Length: (cm) Width: (cm) Depth: (cm) Area: (cm) Volume: (cm) 0 % Reduction in Area: 100% 0 % Reduction in Volume: 100% 0 Epithelialization: Large (67-100%) 0 Tunneling: No 0 Undermining: No Wound Description Classification: Full Thickness Without Exposed Support Structures Wound Margin: Flat and Intact Exudate Amount: None Present Foul Odor After Cleansing: No Slough/Fibrino No Wound Bed Granulation Amount: None Present (0%) Exposed Structure Necrotic Amount: None Present (0%) Fascia Exposed: No Fat Layer (Subcutaneous Tissue) Exposed: No Tendon Exposed: No Muscle Exposed: No Joint Exposed: No Bone Exposed: No Treatment Notes Wound #3 (Lower Leg) Wound Laterality: Right,  Anterior Cleanser Soap and Water Discharge Instruction: May shower and wash wound with dial antibacterial soap and water prior to dressing change. Wound Cleanser Discharge Instruction: Cleanse the wound with wound cleanser prior to applying a clean dressing using gauze sponges, not tissue or cotton balls. Peri-Wound Care Triamcinolone 15 (g) Discharge Instruction: Use triamcinolone 15 (g) as directed Zinc Oxide Ointment 30g tube Discharge Instruction: Apply Zinc Oxide to periwound with each dressing change Sween Lotion (Moisturizing lotion) Discharge Instruction: Apply moisturizing lotion as directed Topical Primary Dressing Secondary Dressing Woven Gauze Sponge, Non-Sterile 4x4 in Discharge Instruction: Apply over primary dressing as directed. Secured With Compression Wrap ThreePress (3 layer compression wrap) Discharge Instruction: Apply three layer compression as directed. Compression Stockings Add-Ons Electronic Signature(s) Signed: 11/17/2021 5:20:15 PM By: Dellie Catholic RN Entered By: Dellie Catholic on 11/16/2021 13:35:13 -------------------------------------------------------------------------------- Vitals Details Patient Name: Date of Service: [name] [name] 11/16/2021 1:30 PM Medical Record Number: [id number] Patient Account Number: [id number] Date of Birth/Sex: Treating RN: April 24, 1961 (61 y.o. [name] [name] Primary Care Joeanna Howdyshell: Fonnie Jarvis Other Clinician: Referring Sharion Grieves: Treating Tarika Mckethan/Extender: Dorann Ou in Treatment: 7 Vital Signs Time Taken: 13:30 Temperature (F): 98.1 Height (in): 71 Pulse (bpm):  67 Weight (lbs): 216 Respiratory Rate (breaths/min): 20 Body Mass Index (BMI): 30.1 Blood Pressure (mmHg): 200/121 Reference Range: 80 - 120 mg / dl Electronic Signature(s) Signed: 11/17/2021 5:20:15 PM By: Dellie Catholic RN Entered By: Dellie Catholic on 11/16/2021 13:32:41

## 2021-11-17 NOTE — Progress Notes (Signed)
JONELL, KRONTZ ([id number]) Visit Report for 11/16/2021 SuperBill Details Patient Name: Date of Service: [name] [name] 11/16/2021 Medical Record Number: [id number] Patient Account Number: [id number] Date of Birth/Sex: Treating RN: September 06, 1961 (61 y.o. [name] Primary Care Provider: Fonnie Jarvis Other Clinician: Referring Provider: Treating Provider/Extender: Dorann Ou in Treatment: 7 Diagnosis Coding ICD-10 Codes Code Description [phone] Chronic venous hypertension (idiopathic) with ulcer of bilateral lower extremity L97.812 Non-pressure chronic ulcer of other part of right lower leg with fat layer exposed L97.822 Non-pressure chronic ulcer of other part of left lower leg with fat layer exposed F17.210 Nicotine dependence, cigarettes, uncomplicated P36 Essential (primary) hypertension J44.9 Chronic obstructive pulmonary disease, unspecified Facility Procedures CPT4 Code Description Modifier Quantity [phone] (Facility Use Only) [phone] - [place] 1 Electronic Signature(s) Signed: 11/16/2021 3:49:50 PM By: Kalman Shan DO Signed: 11/17/2021 5:20:15 PM By: Dellie Catholic RN Entered By: Dellie Catholic on 11/16/2021 14:55:01

## 2021-11-22 ENCOUNTER — Emergency Department (HOSPITAL_COMMUNITY)
Admission: EM | Admit: 2021-11-22 | Discharge: 2021-11-22 | Disposition: A | Discharge status: Home / Self Care | Payer: Medicaid Other | Attending: Emergency Medicine | Admitting: Emergency Medicine

## 2021-11-22 ENCOUNTER — Encounter (HOSPITAL_COMMUNITY): Payer: Self-pay | Admitting: *Deleted

## 2021-11-22 ENCOUNTER — Emergency Department (HOSPITAL_COMMUNITY): Payer: Medicaid Other

## 2021-11-22 DIAGNOSIS — R209 Unspecified disturbances of skin sensation: Secondary | ICD-10-CM | POA: Insufficient documentation

## 2021-11-22 DIAGNOSIS — J439 Emphysema, unspecified: Secondary | ICD-10-CM | POA: Diagnosis not present

## 2021-11-22 DIAGNOSIS — Z7901 Long term (current) use of anticoagulants: Secondary | ICD-10-CM | POA: Insufficient documentation

## 2021-11-22 DIAGNOSIS — M47816 Spondylosis without myelopathy or radiculopathy, lumbar region: Secondary | ICD-10-CM | POA: Diagnosis not present

## 2021-11-22 DIAGNOSIS — M542 Cervicalgia: Secondary | ICD-10-CM | POA: Insufficient documentation

## 2021-11-22 DIAGNOSIS — R531 Weakness: Secondary | ICD-10-CM | POA: Diagnosis not present

## 2021-11-22 DIAGNOSIS — R791 Abnormal coagulation profile: Secondary | ICD-10-CM | POA: Diagnosis not present

## 2021-11-22 DIAGNOSIS — Z743 Need for continuous supervision: Secondary | ICD-10-CM | POA: Diagnosis not present

## 2021-11-22 DIAGNOSIS — R519 Headache, unspecified: Secondary | ICD-10-CM | POA: Insufficient documentation

## 2021-11-22 DIAGNOSIS — Z79899 Other long term (current) drug therapy: Secondary | ICD-10-CM | POA: Insufficient documentation

## 2021-11-22 DIAGNOSIS — I10 Essential (primary) hypertension: Secondary | ICD-10-CM | POA: Diagnosis not present

## 2021-11-22 DIAGNOSIS — M549 Dorsalgia, unspecified: Secondary | ICD-10-CM | POA: Diagnosis not present

## 2021-11-22 DIAGNOSIS — M47814 Spondylosis without myelopathy or radiculopathy, thoracic region: Secondary | ICD-10-CM | POA: Diagnosis not present

## 2021-11-22 DIAGNOSIS — J449 Chronic obstructive pulmonary disease, unspecified: Secondary | ICD-10-CM | POA: Diagnosis not present

## 2021-11-22 DIAGNOSIS — Z20822 Contact with and (suspected) exposure to covid-19: Secondary | ICD-10-CM | POA: Insufficient documentation

## 2021-11-22 DIAGNOSIS — W19XXXA Unspecified fall, initial encounter: Secondary | ICD-10-CM | POA: Insufficient documentation

## 2021-11-22 DIAGNOSIS — Z043 Encounter for examination and observation following other accident: Secondary | ICD-10-CM | POA: Diagnosis not present

## 2021-11-22 DIAGNOSIS — S3991XA Unspecified injury of abdomen, initial encounter: Secondary | ICD-10-CM | POA: Diagnosis not present

## 2021-11-22 DIAGNOSIS — Z7951 Long term (current) use of inhaled steroids: Secondary | ICD-10-CM | POA: Diagnosis not present

## 2021-11-22 LAB — COMPREHENSIVE METABOLIC PANEL
ALT: 20 U/L (ref 0–44)
AST: 18 U/L (ref 15–41)
Albumin: 3.9 g/dL (ref 3.5–5.0)
Alkaline Phosphatase: 102 U/L (ref 38–126)
Anion gap: 9 (ref 5–15)
BUN: 11 mg/dL (ref 6–20)
CO2: 27 mmol/L (ref 22–32)
Calcium: 8.7 mg/dL — ABNORMAL LOW (ref 8.9–10.3)
Chloride: 103 mmol/L (ref 98–111)
Creatinine, Ser: 0.71 mg/dL (ref 0.61–1.24)
GFR, Estimated: >60 mL/min (ref >60)
Glucose, Bld: 105 mg/dL — ABNORMAL HIGH (ref 70–99)
Potassium: 4.2 mmol/L (ref 3.5–5.1)
Sodium: 139 mmol/L (ref 135–145)
Total Bilirubin: 0.3 mg/dL (ref 0.3–1.2)
Total Protein: 7.1 g/dL (ref 6.5–8.1)

## 2021-11-22 LAB — URINALYSIS, ROUTINE W REFLEX MICROSCOPIC
Bilirubin Urine: NEGATIVE
Glucose, UA: NEGATIVE mg/dL
Hgb urine dipstick: NEGATIVE
Ketones, ur: NEGATIVE mg/dL
Leukocytes,Ua: NEGATIVE
Nitrite: NEGATIVE
Protein, ur: NEGATIVE mg/dL
Specific Gravity, Urine: <1.005 — ABNORMAL LOW (ref 1.005–1.030)
pH: 6 (ref 5.0–8.0)

## 2021-11-22 LAB — CBC WITH DIFFERENTIAL/PLATELET
Abs Immature Granulocytes: 0.02 10*3/uL (ref 0.00–0.07)
Basophils Absolute: 0 10*3/uL (ref 0.0–0.1)
Basophils Relative: 1 %
Eosinophils Absolute: 0.2 10*3/uL (ref 0.0–0.5)
Eosinophils Relative: 3 %
HCT: 46.3 % (ref 39.0–52.0)
Hemoglobin: 15.6 g/dL (ref 13.0–17.0)
Immature Granulocytes: 0 %
Lymphocytes Relative: 28 %
Lymphs Abs: 2.4 10*3/uL (ref 0.7–4.0)
MCH: 33.3 pg (ref 26.0–34.0)
MCHC: 33.7 g/dL (ref 30.0–36.0)
MCV: 98.9 fL (ref 80.0–100.0)
Monocytes Absolute: 0.4 10*3/uL (ref 0.1–1.0)
Monocytes Relative: 4 %
Neutro Abs: 5.5 10*3/uL (ref 1.7–7.7)
Neutrophils Relative %: 64 %
Platelets: 220 10*3/uL (ref 150–400)
RBC: 4.68 MIL/uL (ref 4.22–5.81)
RDW: 12.8 % (ref 11.5–15.5)
WBC: 8.5 10*3/uL (ref 4.0–10.5)
nRBC: 0 % (ref 0.0–0.2)

## 2021-11-22 LAB — CK: Total CK: 161 U/L (ref 49–397)

## 2021-11-22 LAB — TROPONIN I (HIGH SENSITIVITY)
Troponin I (High Sensitivity): 3 ng/L (ref <18)
Troponin I (High Sensitivity): 3 ng/L (ref <18)

## 2021-11-22 LAB — RESP PANEL BY RT-PCR (FLU A&B, COVID) ARPGX2
Influenza A by PCR: NEGATIVE
Influenza B by PCR: NEGATIVE
SARS Coronavirus 2 by RT PCR: NEGATIVE

## 2021-11-22 LAB — PROTIME-INR
INR: 1.4 — ABNORMAL HIGH (ref 0.8–1.2)
Prothrombin Time: 17.3 seconds — ABNORMAL HIGH (ref 11.4–15.2)

## 2021-11-22 LAB — LACTIC ACID, PLASMA: Lactic Acid, Venous: 1.3 mmol/L (ref 0.5–1.9)

## 2021-11-22 LAB — MAGNESIUM: Magnesium: 2 mg/dL (ref 1.7–2.4)

## 2021-11-22 LAB — ETHANOL: Alcohol, Ethyl (B): <10 mg/dL (ref <10)

## 2021-11-22 MED ORDER — IOHEXOL 300 MG/ML  SOLN
100.0000 mL | Freq: Once | INTRAMUSCULAR | Status: AC | PRN
  Administered 2021-11-22: 100 mL via INTRAVENOUS

## 2021-11-22 MED ORDER — OXYCODONE HCL 5 MG PO TABS
5.0000 mg | ORAL_TABLET | Freq: Once | ORAL | Status: AC
  Administered 2021-11-22: 5 mg via ORAL
  Filled 2021-11-22: qty 1

## 2021-11-22 NOTE — ED Notes (Signed)
This nurse offered pt bedside commode in order to stay on cardiac monitor. Pt refused and stated that he was going to walk to restroom himself. This nurse educated pt on need to stay on cardiac monitor. Pt ambulated to restroom with walker and this nurse educated pt on use of call bell.

## 2021-11-22 NOTE — ED Notes (Signed)
Pt walked to restroom with rollator. Wanted to sit on the side of the bed, explained to patient that this would not be safe and asked if he could sit back in bed. Pt asked when he could be discharged. Dr notified.

## 2021-11-22 NOTE — ED Provider Notes (Signed)
Major Provider Note   CSN: [id number] Arrival date & time: 11/22/21  1447     History  Chief Complaint  Patient presents with   [name] [name] is a 61 y.o. male.   Fall Pertinent negatives include no chest pain, no abdominal pain and no shortness of breath. Patient presents after a fall.  Fall occurred 3 to 4 days ago.  Per chart review, medical history is notable for HTN, COPD, chronic back pain, sciatica, anxiety, CVA, chronic right-sided weakness.  He is prescribed Xarelto.  He does continue to take this medication and his last dose was this morning.  In regards to the fall, patient states that he woke up when he hit the floor.  This was in the middle of the night and he suspects he may have been sleepwalking.  At baseline, he walks with a rolling walker.  He did have his rolling walker close by.  He was able to get back to his feet.  2 days ago, he began experiencing midline neck pain, pain shooting down his entire spine, bilateral hand numbness, and worsening leg weakness.     Home Medications Prior to Admission medications   Medication Sig Start Date End Date Taking? Authorizing Provider  albuterol (PROVENTIL HFA;VENTOLIN HFA) 108 (90 BASE) MCG/ACT inhaler Inhale 2 puffs into the lungs every 4 (four) hours as needed for wheezing. Reported on 04/11/2016    [provider]  amLODipine (NORVASC) 10 MG tablet Take 1 tablet (10 mg total) by mouth daily. 01/21/16   Leone Brand, MD  atorvastatin (LIPITOR) 80 MG tablet TAKE 1 TABLET (80 MG TOTAL) BY MOUTH DAILY AT 6 PM. Patient taking differently: Take 80 mg by mouth daily. 03/09/16   Leone Brand, MD  Azelastine HCl 137 MCG/SPRAY SOLN Place 1 spray into both nostrils 2 (two) times daily. 06/07/21   [provider]  finasteride (PROSCAR) 5 MG tablet Take 1 tablet (5 mg total) by mouth daily. 03/09/16   Leone Brand, MD  furosemide (LASIX) 20 MG tablet Take 20 mg by mouth in the morning  and at bedtime. 05/21/21   [provider]  lisinopril (PRINIVIL,ZESTRIL) 40 MG tablet Take 1 tablet (40 mg total) by mouth daily. 03/09/16   Leone Brand, MD  pregabalin (LYRICA) 300 MG capsule Take 1 capsule (300 mg total) by mouth 2 (two) times daily. Patient taking differently: Take 150 mg by mouth in the morning, at noon, in the evening, and at bedtime. 05/30/16   Katheren Shams, DO  propranolol (INDERAL) 20 MG tablet Take 1 tablet (20 mg total) by mouth 3 (three) times daily. 03/21/16   Leone Brand, MD  rivaroxaban (XARELTO) 20 MG TABS tablet TAKE 1 TABLET BY MOUTH EVERY [place] WITH SUPPER Patient taking differently: Take 20 mg by mouth daily with supper. 03/09/16   Leone Brand, MD  SUBOXONE 8-2 MG FILM Place 1 strip under the tongue 3 (three) times daily. 06/02/21   [provider]  sulfamethoxazole-trimethoprim (BACTRIM) 400-80 MG tablet Take 1 tablet by mouth 2 (two) times daily. 09/13/21   Serafina Mitchell, MD      Allergies    Amlodipine, Fentanyl, and Methadone    Review of Systems   Review of Systems  Constitutional:  Negative for appetite change, chills, diaphoresis, fatigue and fever.  HENT:  Negative for congestion, ear pain and sore throat.   Eyes:  Negative for pain and visual disturbance.  Respiratory:  Negative for cough, chest tightness, shortness of breath and wheezing.   Cardiovascular:  Negative for chest pain, palpitations and leg swelling.  Gastrointestinal:  Negative for abdominal pain, diarrhea, nausea and vomiting.  Genitourinary:  Negative for dysuria, flank pain and hematuria.  Musculoskeletal:  Positive for arthralgias, back pain, gait problem and neck pain. Negative for joint swelling.  Skin:  Negative for color change, rash and wound.  Neurological:  Positive for weakness and numbness. Negative for seizures, syncope, facial asymmetry, speech difficulty and light-headedness.  Hematological:  Bruises/bleeds easily.  Psychiatric/Behavioral:   Negative for confusion and decreased concentration.   All other systems reviewed and are negative.  Physical Exam Updated Vital Signs BP (!) 160/88    Pulse (!) 58    Temp 98 F (36.7 C)    Resp 13    SpO2 95%  Physical Exam Vitals and nursing note reviewed.  Constitutional:      General: He is not in acute distress.    Appearance: He is well-developed. He is ill-appearing (chronically). He is not toxic-appearing or diaphoretic.  HENT:     Head: Normocephalic and atraumatic.     Right Ear: External ear normal.     Left Ear: External ear normal.     Nose: Nose normal.     Mouth/Throat:     Mouth: Mucous membranes are moist.     Pharynx: Oropharynx is clear.  Eyes:     Extraocular Movements: Extraocular movements intact.     Conjunctiva/sclera: Conjunctivae normal.  Cardiovascular:     Rate and Rhythm: Normal rate and regular rhythm.     Heart sounds: No murmur heard. Pulmonary:     Effort: Pulmonary effort is normal. No respiratory distress.     Breath sounds: Normal breath sounds. No wheezing or rales.  Chest:     Chest wall: No tenderness.  Abdominal:     Palpations: Abdomen is soft.     Tenderness: There is no abdominal tenderness.  Musculoskeletal:        General: Tenderness present. No swelling or deformity.     Cervical back: Normal range of motion and neck supple. Tenderness present. No rigidity.  Skin:    General: Skin is warm and dry.     Capillary Refill: Capillary refill takes less than 2 seconds.     Coloration: Skin is not jaundiced or pale.  Neurological:     Mental Status: He is alert and oriented to person, place, and time.     Cranial Nerves: No cranial nerve deficit.     Motor: Weakness present.     Coordination: Coordination normal.  Psychiatric:        Mood and Affect: Mood normal.        Behavior: Behavior normal.    ED Results / Procedures / Treatments   Labs (all labs ordered are listed, but only abnormal results are displayed) Labs Reviewed   COMPREHENSIVE METABOLIC PANEL - Abnormal; Notable for the following components:      Result Value   Glucose, Bld 105 (*)    Calcium 8.7 (*)    All other components within normal limits  URINALYSIS, ROUTINE W REFLEX MICROSCOPIC - Abnormal; Notable for the following components:   Specific Gravity, Urine <1.005 (*)    All other components within normal limits  PROTIME-INR - Abnormal; Notable for the following components:   Prothrombin Time 17.3 (*)    INR 1.4 (*)    All other components within normal limits  RESP  PANEL BY RT-PCR (FLU A&B, COVID) ARPGX2  ETHANOL  LACTIC ACID, PLASMA  CK  CBC WITH DIFFERENTIAL/PLATELET  MAGNESIUM  TROPONIN I (HIGH SENSITIVITY)  TROPONIN I (HIGH SENSITIVITY)    EKG EKG Interpretation  Date/Time:  Monday November 22 2021 16:46:08 EST Ventricular Rate:  60 PR Interval:  165 QRS Duration: 83 QT Interval:  463 QTC Calculation: 463 R Axis:   -8 Text Interpretation: Sinus rhythm Confirmed by Godfrey Pick [phone]) on 11/22/2021 5:21:18 PM  Radiology CT HEAD WO CONTRAST  Result Date: 11/22/2021 CLINICAL DATA:  Patient status post fall 4 days prior. Worsening headache and neck pain. EXAM: CT HEAD WITHOUT CONTRAST CT CERVICAL SPINE WITHOUT CONTRAST TECHNIQUE: Multidetector CT imaging of the head and cervical spine was performed following the standard protocol without intravenous contrast. Multiplanar CT image reconstructions of the cervical spine were also generated. RADIATION DOSE REDUCTION: This exam was performed according to the departmental dose-optimization program which includes automated exposure control, adjustment of the mA and/or kV according to patient size and/or use of iterative reconstruction technique. COMPARISON:  MRI brain 09/18/2015. FINDINGS: CT HEAD FINDINGS Brain: Ventricles and sulci are appropriate for patient's age. Remote left MCA territory infarct. No evidence for acute cortical based infarct, intracranial hemorrhage, mass lesion or mass  effect. Vascular: Unremarkable. Skull: Intact. Sinuses/Orbits: Mucosal thickening involving the paranasal sinuses. Small fluid within the right maxillary sinus. Mastoid air cells are unremarkable. Other: None. CT CERVICAL SPINE FINDINGS Alignment: Normal anatomic alignment. Skull base and vertebrae: Intact. Soft tissues and spinal canal: No prevertebral fluid or swelling. No visible canal hematoma. Disc levels: C5-C7 anterior cervical spinal fusion. Degenerative disc disease C3-4 and C4-5. No evidence for acute fracture. Facet degenerative changes. Upper chest: Apical emphysematous change. Other: None IMPRESSION: No acute intracranial process.  Remote left MCA territory infarct. No acute cervical spine fracture.  Cervical spinal fusion hardware. Electronically Signed   By: Lovey Newcomer M.D.   On: 11/22/2021 18:25   CT CERVICAL SPINE WO CONTRAST  Result Date: 11/22/2021 CLINICAL DATA:  Patient status post fall 4 days prior. Worsening headache and neck pain. EXAM: CT HEAD WITHOUT CONTRAST CT CERVICAL SPINE WITHOUT CONTRAST TECHNIQUE: Multidetector CT imaging of the head and cervical spine was performed following the standard protocol without intravenous contrast. Multiplanar CT image reconstructions of the cervical spine were also generated. RADIATION DOSE REDUCTION: This exam was performed according to the departmental dose-optimization program which includes automated exposure control, adjustment of the mA and/or kV according to patient size and/or use of iterative reconstruction technique. COMPARISON:  MRI brain 09/18/2015. FINDINGS: CT HEAD FINDINGS Brain: Ventricles and sulci are appropriate for patient's age. Remote left MCA territory infarct. No evidence for acute cortical based infarct, intracranial hemorrhage, mass lesion or mass effect. Vascular: Unremarkable. Skull: Intact. Sinuses/Orbits: Mucosal thickening involving the paranasal sinuses. Small fluid within the right maxillary sinus. Mastoid air cells  are unremarkable. Other: None. CT CERVICAL SPINE FINDINGS Alignment: Normal anatomic alignment. Skull base and vertebrae: Intact. Soft tissues and spinal canal: No prevertebral fluid or swelling. No visible canal hematoma. Disc levels: C5-C7 anterior cervical spinal fusion. Degenerative disc disease C3-4 and C4-5. No evidence for acute fracture. Facet degenerative changes. Upper chest: Apical emphysematous change. Other: None IMPRESSION: No acute intracranial process.  Remote left MCA territory infarct. No acute cervical spine fracture.  Cervical spinal fusion hardware. Electronically Signed   By: Lovey Newcomer M.D.   On: 11/22/2021 18:25   CT CHEST ABDOMEN PELVIS W CONTRAST  Addendum Date: 11/22/2021  ADDENDUM REPORT: 11/22/2021 18:31 ADDENDUM: Emphysema (ICD10-J43.9). Electronically Signed   By: Iven Finn M.D.   On: 11/22/2021 18:31   Result Date: 11/22/2021 CLINICAL DATA:  Abdominal trauma, blunt. lost consciousness and fell hitting head 4 days ago. Pt c/o worsening headache and pain in neck, back and shoulder pain since fall. EXAM: CT CHEST, ABDOMEN, AND PELVIS WITH CONTRAST TECHNIQUE: Multidetector CT imaging of the chest, abdomen and pelvis was performed following the standard protocol during bolus administration of intravenous contrast. RADIATION DOSE REDUCTION: This exam was performed according to the departmental dose-optimization program which includes automated exposure control, adjustment of the mA and/or kV according to patient size and/or use of iterative reconstruction technique. CONTRAST:  178mL OMNIPAQUE IOHEXOL 300 MG/ML  SOLN COMPARISON:  None. FINDINGS: CHEST: Ports and Devices: None. Lungs/airways: Mild paraseptal and centrilobular emphysematous changes. No focal consolidation. No pulmonary nodule. No pulmonary mass. No pulmonary contusion or laceration. No pneumatocele formation. The central airways are patent. Pleura: No pleural effusion. No pneumothorax. No hemothorax. Lymph Nodes:  No mediastinal, hilar, or axillary lymphadenopathy. Mediastinum: No pneumomediastinum. No aortic injury or mediastinal hematoma. The thoracic aorta is normal in caliber. At least mild atherosclerotic plaque of the aorta. The heart is normal in size. No significant pericardial effusion. The esophagus is unremarkable. The thyroid is unremarkable. Chest Wall / Breasts: No chest wall mass. Musculoskeletal: No acute rib or sternal fracture. Incidentally noted fusion of the left first and second ribs. Please see separately dictated CT thoracolumbar spine 11/22/2021. ABDOMEN / PELVIS: Liver: Not enlarged. Couple subcentimeter hypodensities within the left hepatic lobe are too small to characterize. Subcentimeter hypodensity within the right hepatic lobe is too small to characterize. Otherwise no focal lesion. No laceration or subcapsular hematoma. Biliary System: The gallbladder is otherwise unremarkable with no radio-opaque gallstones. No biliary ductal dilatation. Pancreas: Normal pancreatic contour. No main pancreatic duct dilatation. Spleen: Not enlarged. No focal lesion. No laceration, subcapsular hematoma, or vascular injury. Adrenal Glands: No nodularity bilaterally. Kidneys: Bilateral kidneys enhance symmetrically. No hydronephrosis. No contusion, laceration, or subcapsular hematoma. No injury to the vascular structures or collecting systems. No hydroureter. The urinary bladder is unremarkable. Bowel: No small or large bowel wall thickening or dilatation. The appendix is unremarkable. Mesentery, Omentum, and Peritoneum: No simple free fluid ascites. No pneumoperitoneum. No hemoperitoneum. No mesenteric hematoma identified. No organized fluid collection. Pelvic Organs: Normal. Lymph Nodes: No abdominal, pelvic, inguinal lymphadenopathy. Vasculature: Severe atherosclerotic plaque. No abdominal aorta or iliac aneurysm. No active contrast extravasation or pseudoaneurysm. Musculoskeletal: No significant soft tissue  hematoma. No acute pelvic fracture. Please see separately dictated CT thoracolumbar spine 11/22/2021. IMPRESSION: 1. No acute traumatic injury to the chest, abdomen, or pelvis. 2.  Aortic Atherosclerosis (ICD10-I70.0). 3. Please see separately dictated CT thoracolumbar spine 11/22/2021. Electronically Signed: By: Iven Finn M.D. On: 11/22/2021 18:25   CT T-SPINE NO CHARGE  Result Date: 11/22/2021 CLINICAL DATA:  Status post fall EXAM: CT THORACIC AND LUMBAR SPINE WITHOUT CONTRAST TECHNIQUE: Multidetector CT imaging of the thoracic and lumbar spine was performed without contrast. Multiplanar CT image reconstructions were also generated. RADIATION DOSE REDUCTION: This exam was performed according to the departmental dose-optimization program which includes automated exposure control, adjustment of the mA and/or kV according to patient size and/or use of iterative reconstruction technique. COMPARISON:  None. FINDINGS: CT THORACIC SPINE FINDINGS Alignment: Normal. Vertebrae: Multilevel mild to moderate degenerative changes spine. No acute fracture or focal pathologic process. Paraspinal and other soft tissues: Negative. Disc levels: Maintained. CT LUMBAR  SPINE FINDINGS Segmentation: 5 lumbar type vertebrae. Alignment: Normal. Vertebrae: Multilevel at least moderate degenerative changes spine. Posterior disc osteophyte complex formation of the L4-L5 and L5-S1 levels. No severe osseous neural foraminal or central canal stenosis. No acute fracture or focal pathologic process. Paraspinal and other soft tissues: Negative. Disc levels: Intervertebral disc space vacuum phenomenon at the L4-L5 L5-S1 levels. IMPRESSION: CT THORACIC SPINE IMPRESSION No acute displaced fracture or traumatic listhesis of the thoracic spine. CT LUMBAR SPINE IMPRESSION No acute displaced fracture or traumatic listhesis of the lumbar spine. 1. Please see separately dictated CT head, cervical spine, chest, abdomen, pelvis 11/22/2021 for  further details. 2. Aortic Atherosclerosis (ICD10-I70.0) and Emphysema (ICD10-J43.9). Electronically Signed   By: Iven Finn M.D.   On: 11/22/2021 18:30   CT L-SPINE NO CHARGE  Result Date: 11/22/2021 CLINICAL DATA:  Status post fall EXAM: CT THORACIC AND LUMBAR SPINE WITHOUT CONTRAST TECHNIQUE: Multidetector CT imaging of the thoracic and lumbar spine was performed without contrast. Multiplanar CT image reconstructions were also generated. RADIATION DOSE REDUCTION: This exam was performed according to the departmental dose-optimization program which includes automated exposure control, adjustment of the mA and/or kV according to patient size and/or use of iterative reconstruction technique. COMPARISON:  None. FINDINGS: CT THORACIC SPINE FINDINGS Alignment: Normal. Vertebrae: Multilevel mild to moderate degenerative changes spine. No acute fracture or focal pathologic process. Paraspinal and other soft tissues: Negative. Disc levels: Maintained. CT LUMBAR SPINE FINDINGS Segmentation: 5 lumbar type vertebrae. Alignment: Normal. Vertebrae: Multilevel at least moderate degenerative changes spine. Posterior disc osteophyte complex formation of the L4-L5 and L5-S1 levels. No severe osseous neural foraminal or central canal stenosis. No acute fracture or focal pathologic process. Paraspinal and other soft tissues: Negative. Disc levels: Intervertebral disc space vacuum phenomenon at the L4-L5 L5-S1 levels. IMPRESSION: CT THORACIC SPINE IMPRESSION No acute displaced fracture or traumatic listhesis of the thoracic spine. CT LUMBAR SPINE IMPRESSION No acute displaced fracture or traumatic listhesis of the lumbar spine. 1. Please see separately dictated CT head, cervical spine, chest, abdomen, pelvis 11/22/2021 for further details. 2. Aortic Atherosclerosis (ICD10-I70.0) and Emphysema (ICD10-J43.9). Electronically Signed   By: Iven Finn M.D.   On: 11/22/2021 18:30    Procedures Procedures    Medications  Ordered in ED Medications  oxyCODONE (Oxy IR/ROXICODONE) immediate release tablet 5 mg (5 mg Oral Given 11/22/21 1718)  iohexol (OMNIPAQUE) 300 MG/ML solution 100 mL (100 mLs Intravenous Contrast Given 11/22/21 1815)    ED Course/ Medical Decision Making/ A&P                           Medical Decision Making Amount and/or Complexity of Data Reviewed Labs: ordered. Radiology: ordered. ECG/medicine tests: ordered.  Risk Prescription drug management.  This patient presents to the ED for concern of fall, this involves an extensive number of treatment options, and is a complaint that carries with it a high risk of complications and morbidity.  The differential diagnosis includes acute injuries from fall, CVA, anemia, electrolyte disturbances, alcohol intoxication or withdrawal, polypharmacy, medication withdrawal   Co morbidities that complicate the patient evaluation  HTN, COPD, chronic back pain, sciatica, anxiety, CVA, chronic right-sided weakness.     Additional history obtained:  External records from outside source obtained and reviewed including EMR   Lab Tests:  I Ordered, and personally interpreted labs.  The pertinent results include:  normal findings   Imaging Studies ordered:  I ordered imaging studies including Complete  trauma scans  I independently visualized and interpreted imaging which showed no acute injuries I agree with the radiologist interpretation   Cardiac Monitoring:  The patient was maintained on a cardiac monitor.  I personally viewed and interpreted the cardiac monitored which showed an underlying rhythm of: Sinus rhythm   Medicines ordered and prescription drug management:  I ordered medication including Roxicodone for analgesia Reevaluation of the patient after these medicines showed that the patient improved I have reviewed the patients home medicines and have made adjustments as needed   Test Considered:  MRI  Problem List / ED  Course:  Patient presents after a fall.  Fall occurred 3 to 4 days ago.  Medical history is notable for HTN, COPD, chronic back pain, sciatica, anxiety, CVA, chronic right-sided weakness.  He is prescribed Xarelto.  Vital signs on arrival notable for hypertension.  On exam, patient has areas of tenderness throughout his spine.  He also has generalized weakness that is slightly worse on the left.  He does seem to be amnestic to the fall.  Patient denies any alcohol use.  He does request narcotic pain medication on arrival.  Roxicodone was ordered.  Due to the limited history and patient's multiple areas of pain and tenderness, complete trauma scans were ordered.  Laboratory work-up was initiated as well.  Results of lab work-up were all reassuring.  Additionally, patient had no findings of acute injuries on CT scans.  There is concern of a subacute CVA, given the weakness he is on initial exam it seems to be slightly worse on the left.  Given the time of night that patient came to the ED, MRI for further evaluation is currently not available until the morning.  However, in the ED, patient was able to ambulate in his room.  This represents a significant improvement from his strength on arrival.  He actually requested to be discharged.  Given no further evidence of weakness and patient weakness and patient's demonstration that his motor function is baseline, patient was discharged and advised to follow-up with his primary care doctor.   Reevaluation:  After the interventions noted above, I reevaluated the patient and found that they have :resolved   Social Determinants of Health:  Patient has chronic illness and lives alone.  He does have access to outpatient medical care.   Dispostion:  After consideration of the diagnostic results and the patients response to treatment, I feel that the patent would benefit from discharge with PCP follow-up..           Final Clinical Impression(s) / ED  Diagnoses Final diagnoses:  Fall, initial encounter    Rx / DC Orders ED Discharge Orders     None         Godfrey Pick, MD 11/24/21 [phone]

## 2021-11-22 NOTE — ED Notes (Signed)
Patient transported to CT 

## 2021-11-22 NOTE — ED Triage Notes (Signed)
Fell 3-4 days ago, states he may be sleepwalking again. States he has multiple area of pain and numbness both hands,

## 2021-11-23 ENCOUNTER — Encounter (HOSPITAL_BASED_OUTPATIENT_CLINIC_OR_DEPARTMENT_OTHER): Payer: Medicaid Other | Admitting: Internal Medicine

## 2021-11-23 ENCOUNTER — Other Ambulatory Visit: Payer: Self-pay

## 2021-11-23 DIAGNOSIS — I87313 Chronic venous hypertension (idiopathic) with ulcer of bilateral lower extremity: Secondary | ICD-10-CM | POA: Diagnosis not present

## 2021-11-23 DIAGNOSIS — G629 Polyneuropathy, unspecified: Secondary | ICD-10-CM | POA: Diagnosis not present

## 2021-11-23 DIAGNOSIS — L97812 Non-pressure chronic ulcer of other part of right lower leg with fat layer exposed: Secondary | ICD-10-CM

## 2021-11-23 DIAGNOSIS — J449 Chronic obstructive pulmonary disease, unspecified: Secondary | ICD-10-CM | POA: Diagnosis not present

## 2021-11-23 DIAGNOSIS — Z86718 Personal history of other venous thrombosis and embolism: Secondary | ICD-10-CM | POA: Diagnosis not present

## 2021-11-23 DIAGNOSIS — L97822 Non-pressure chronic ulcer of other part of left lower leg with fat layer exposed: Secondary | ICD-10-CM

## 2021-11-23 DIAGNOSIS — Z7689 Persons encountering health services in other specified circumstances: Secondary | ICD-10-CM | POA: Diagnosis not present

## 2021-11-23 DIAGNOSIS — I10 Essential (primary) hypertension: Secondary | ICD-10-CM | POA: Diagnosis not present

## 2021-11-23 DIAGNOSIS — F17210 Nicotine dependence, cigarettes, uncomplicated: Secondary | ICD-10-CM | POA: Diagnosis not present

## 2021-11-24 NOTE — Progress Notes (Addendum)
[name] [name] ([id number]) Visit Report for 11/23/2021 Arrival Information Details Patient Name: Date of Service: [name] [name] 11/23/2021 1:45 PM Medical Record Number: [id number] Patient Account Number: [id number] Date of Birth/Sex: Treating RN: Aug 22, 1961 (61 y.o. [name] [name] Primary Care [name] [name]: [name] [name] Other Clinician: Referring [name] [name]: Treating [name] [name]/Extender: [name] [name] in Treatment: [name] Visit Information History Since Last Visit Added or deleted any medications: No Patient Arrived: [name] [name] Any new allergies or adverse reactions: No Arrival Time: 13:32 Had a fall or experienced change in No Transfer Assistance: None activities of daily living that may affect Patient Identification Verified: Yes risk of falls: Secondary Verification Process Completed: Yes Signs or symptoms of abuse/neglect since last visito No Patient Requires Transmission-Based Precautions: No Hospitalized since last visit: No Patient Has Alerts: Yes Implantable device outside of the clinic excluding No Patient Alerts: Patient on Blood Thinner cellular tissue based products placed in the center since last visit: Has Dressing in Place as Prescribed: Yes Has Compression in Place as Prescribed: Yes Pain Present Now: No Electronic Signature(s) Signed: 11/23/2021 3:58:04 PM By: Lorrin Jackson Entered By: Lorrin Jackson on 11/23/2021 13:36:13 -------------------------------------------------------------------------------- Compression Therapy Details Patient Name: Date of Service: [name] [name] 11/23/2021 1:45 PM Medical Record Number: [id number] Patient Account Number: [id number] Date of Birth/Sex: Treating RN: [birth year] (61 y.o. [name] [name] Primary Care Glyndon Tursi: [name] [name] Other Clinician: Referring Alixandria Friedt: Treating [name] [name]/Extender: [name] [name] in Treatment: [name] Compression Therapy Performed for Wound Assessment:  Wound #1 Left,Anterior Lower Leg Performed By: Clinician Lorrin Jackson, RN Compression Type: Three Layer Post Procedure Diagnosis Same as Pre-procedure Electronic Signature(s) Signed: 11/23/2021 3:58:04 PM By: Lorrin Jackson Entered By: Lorrin Jackson on 11/23/2021 13:53:12 -------------------------------------------------------------------------------- Encounter Discharge Information Details Patient Name: Date of Service: [name] [name] 11/23/2021 1:45 PM Medical Record Number: [id number] Patient Account Number: [id number] Date of Birth/Sex: Treating RN: 15-May-1961 (61 y.o. [name] [name] Primary Care Alexander Mcauley: [name] [name] Other Clinician: Referring Arnold Kester: Treating Lihanna Biever/Extender: [name] [name] in Treatment: [name] Encounter Discharge Information Items Discharge Condition: Stable Ambulatory Status: Walker Discharge Destination: Home Transportation: Private Auto Schedule Follow-up Appointment: Yes Clinical Summary of Care: Provided on 11/23/2021 Form Type Recipient Paper Patient Patient Electronic Signature(s) Signed: 11/23/2021 3:58:04 PM By: Lorrin Jackson Entered By: Lorrin Jackson on 11/23/2021 14:11:58 -------------------------------------------------------------------------------- Lower Extremity Assessment Details Patient Name: Date of Service: [name] [name] 11/23/2021 1:45 PM Medical Record Number: [id number] Patient Account Number: [id number] Date of Birth/Sex: Treating RN: 1961-09-09 (61 y.o. [name] [name] Primary Care Suda Forbess: [name] [name] Other Clinician: Referring [name] [name]: Treating [name] [name]/Extender: [name] [name] [name] [name]: [name] [name]: Yes] [name] [name]: No] Edema: [Left: Ye] [Right: s] Calf Left: Right: Point of Measurement: 34 cm From Medial Instep 41 cm Ankle Left: Right: Point of Measurement: 9 cm From Medial Instep 25.5 cm Vascular  Assessment Pulses: Dorsalis Pedis Palpable: [Left:Yes] Electronic Signature(s) Signed: 11/23/2021 3:58:04 PM By: Lorrin Jackson Entered By: Lorrin Jackson on 11/23/2021 13:42:25 -------------------------------------------------------------------------------- Multi Wound Chart Details Patient Name: Date of Service: [name] [name] 11/23/2021 1:45 PM Medical Record Number: [id number] Patient Account Number: [id number] Date of Birth/Sex: Treating RN: 1960-11-14 (61 y.o. [name] [name] Primary Care [name] [name]: [name] [name] Other Clinician: Referring [name] [name]: Treating [name] [name]/Extender: [name] [name] in Treatment: [name] Vital Signs Height(in): 71 Pulse(bpm): 75 Weight(lbs): 216 Blood Pressure(mmHg): 156/88 Body Mass Index(BMI): 30 Temperature(F): 98.2 Respiratory Rate(breaths/min): 20 Photos: [3:No Photos] [N/A:N/A] Left, Anterior Lower Leg Right,  Anterior Lower Leg N/A Wound Location: Gradually Appeared Gradually Appeared N/A Wounding Event: Venous Leg Ulcer Venous Leg Ulcer N/A Primary Etiology: Chronic Obstructive Pulmonary Chronic Obstructive Pulmonary N/A Comorbid History: Disease (COPD), Deep Vein Disease (COPD), Deep Vein Thrombosis, Hypertension, Peripheral Thrombosis, Hypertension, Peripheral Venous Disease, Neuropathy Venous Disease, Neuropathy 11/[name]/2022 11/[name]/2022 N/A Date Acquired: [name] [name] N/A [name] of Treatment: Open Healed - Epithelialized N/A Wound Status: 0.1x0.1x0.1 0x0x0 N/A Measurements L x W x D (cm) 0.008 0 N/A A (cm) : rea 0.001 0 N/A Volume (cm) : 99.90% 100.00% N/A % Reduction in Area: 99.80% 100.00% N/A % Reduction in Volume: Full Thickness Without Exposed Full Thickness Without Exposed N/A Classification: Support Structures Support Structures Medium N/A N/A Exudate Amount: Serosanguineous N/A N/A Exudate Type: red, brown N/A N/A Exudate Color: Flat and Intact N/A N/A Wound Margin: Large (67-100%) N/A  N/A Granulation Amount: Red N/A N/A Granulation Quality: None Present (0%) N/A N/A Necrotic Amount: Fat Layer (Subcutaneous Tissue): Yes N/A N/A Exposed Structures: Fascia: No Tendon: No Muscle: No Joint: No Bone: No Small (1-33%) N/A N/A Epithelialization: Compression Therapy N/A N/A Procedures Performed: Treatment Notes Electronic Signature(s) Signed: 11/23/2021 2:00:08 PM By: Kalman Shan DO Signed: 11/24/2021 12:02:35 PM By: Deon Pilling RN, BSN Entered By: Kalman Shan on 11/23/2021 13:54:25 -------------------------------------------------------------------------------- Multi-Disciplinary Care Plan Details Patient Name: Date of Service: [name] [name] 11/23/2021 1:45 PM Medical Record Number: [id number] Patient Account Number: [id number] Date of Birth/Sex: Treating RN: 30-Apr-1961 (61 y.o. [name] [name] Primary Care Joyce Leckey: [name] [name] Other Clinician: Referring Amarilys Lyles: Treating Jailin Manocchio/Extender: [name] [name] in Treatment: [name] Active Inactive Electronic Signature(s) Signed: 11/30/2021 5:00:04 PM By: Lorrin Jackson Previous Signature: 11/23/2021 3:58:04 PM Version By: Lorrin Jackson Entered By: Lorrin Jackson on 11/30/2021 17:00:04 -------------------------------------------------------------------------------- Pain Assessment Details Patient Name: Date of Service: [name] [name] 11/23/2021 1:45 PM Medical Record Number: [id number] Patient Account Number: [id number] Date of Birth/Sex: Treating RN: 1960-12-09 (61 y.o. [name] [name] Primary Care Frenchie Pribyl: [name] [name] Other Clinician: Referring Archibald Marchetta: Treating Aamirah Salmi/Extender: [name] [name] in Treatment: [name] Active Problems Location of Pain Severity and Description of Pain Patient Has Paino No Site Locations Pain Management and Medication Current Pain Management: Electronic Signature(s) Signed: 11/23/2021 3:58:04 PM By:  Lorrin Jackson Entered By: Lorrin Jackson on 11/23/2021 13:36:47 -------------------------------------------------------------------------------- Patient/Caregiver Education Details Patient Name: Date of Service: [name] [name] 1/17/2023andnbsp1:45 PM Medical Record Number: [id number] Patient Account Number: [id number] Date of Birth/Gender: Treating RN: 1961-09-05 (61 y.o. [name] [name] Primary Care Physician: [name] [name] Other Clinician: Referring Physician: Treating Physician/Extender: [name] [name] in Treatment: [name] Education Assessment Education Provided To: Patient Education Topics Provided Nutrition: Methods: Explain/Verbal Responses: State content correctly Smoking and Wound Healing: Methods: Explain/Verbal Responses: State content correctly Venous: Methods: Explain/Verbal, Printed Responses: State content correctly Wound/Skin Impairment: Methods: Explain/Verbal, Printed Responses: State content correctly Electronic Signature(s) Signed: 11/23/2021 3:58:04 PM By: Lorrin Jackson Entered By: Lorrin Jackson on 11/23/2021 13:32:38 -------------------------------------------------------------------------------- Wound Assessment Details Patient Name: Date of Service: [name] [name] 11/23/2021 1:45 PM Medical Record Number: [id number] Patient Account Number: [id number] Date of Birth/Sex: Treating RN: 01-25-1961 (61 y.o. [name] [name] Primary Care Jamaira Sherk: [name] [name] Other Clinician: Referring Haji Delaine: Treating Estelene Carmack/Extender: [name] [name] Wound Status Wound Number: 1 Primary Venous Leg Ulcer Etiology: Wound Location: Left, Anterior Lower Leg Wound Healed - Epithelialized Wounding Event: Gradually Appeared Status: Date Acquired: 11/[name]/2022 Comorbid Chronic Obstructive Pulmonary Disease (COPD), Deep Vein [name] Of  Treatment: [name] History: Thrombosis, Hypertension, Peripheral  Venous Disease, Neuropathy Clustered Wound: No Photos Wound Measurements Length: (cm) Width: (cm) Depth: (cm) Area: (cm) Volume: (cm) 0 % Reduction in Area: 100% 0 % Reduction in Volume: 100% 0 0 0 Wound Description Classification: Full Thickness Without Exposed Support Structur es Assessment Notes 11/30/21: Patient called and stated wounds were healed. Treatment Notes Wound #1 (Lower Leg) Wound Laterality: Left, Anterior Cleanser Soap and Water Discharge Instruction: May shower and wash wound with dial antibacterial soap and water prior to dressing change. Wound Cleanser Discharge Instruction: Cleanse the wound with wound cleanser prior to applying a clean dressing using gauze sponges, not tissue or cotton balls. Peri-Wound Care Triamcinolone 15 (g) Discharge Instruction: Use triamcinolone 15 (g) as directed Sween Lotion (Moisturizing lotion) Discharge Instruction: Apply moisturizing lotion as directed Topical Primary Dressing Maxorb Extra Calcium Alginate 2x2 in Discharge Instruction: Apply calcium alginate to wound bed as instructed Secondary Dressing Woven Gauze Sponge, Non-Sterile 4x4 in Discharge Instruction: Apply over primary dressing as directed. Secured With Compression Wrap ThreePress (3 layer compression wrap) Discharge Instruction: Apply three layer compression as directed. Compression Stockings Add-Ons Electronic Signature(s) Signed: 11/30/2021 4:59:34 PM By: Lorrin Jackson Previous Signature: 11/23/2021 3:58:04 PM Version By: Lorrin Jackson Entered By: Lorrin Jackson on 11/30/2021 16:59:33 -------------------------------------------------------------------------------- Wound Assessment Details Patient Name: Date of Service: [name] [name] 11/23/2021 1:45 PM Medical Record Number: [id number] Patient Account Number: [id number] Date of Birth/Sex: Treating RN: May 26, 1961 (61 y.o. [name] [name] Primary Care Mahdi Frye: [name] [name] Other  Clinician: Referring Daden Mahany: Treating Nikkole Placzek/Extender: [name] [name] Wound Status Wound Number: 3 Primary Venous Leg Ulcer Etiology: Wound Location: Right, Anterior Lower Leg Wound Healed - Epithelialized Wounding Event: Gradually Appeared Status: Date Acquired: 11/[name]/2022 Comorbid Chronic Obstructive Pulmonary Disease (COPD), Deep Vein [name] Of Treatment: [name] History: Thrombosis, Hypertension, Peripheral Venous Disease, Neuropathy Clustered Wound: No Wound Measurements Length: (cm) Width: (cm) Depth: (cm) Area: (cm) Volume: (cm) 0 % Reduction in Area: 100% 0 % Reduction in Volume: 100% 0 0 0 Wound Description Classification: Full Thickness Without Exposed Support Structur es Electronic Signature(s) Signed: 11/23/2021 3:58:04 PM By: Lorrin Jackson Entered By: Lorrin Jackson on 11/23/2021 13:44:36 -------------------------------------------------------------------------------- Vitals Details Patient Name: Date of Service: DA Steele Berg [name] 11/23/2021 1:45 PM Medical Record Number: [id number] Patient Account Number: [id number] Date of Birth/Sex: Treating RN: 1961/02/12 (61 y.o. [name] [name] Primary Care Dezra Mandella: [name] [name] Other Clinician: Referring Ireanna Finlayson: Treating Ariani Seier/Extender: [name] [name] in Treatment: [name] Vital Signs Time Taken: 13:36 Temperature (F): 98.2 Height (in): 71 Pulse (bpm): 75 Weight (lbs): 216 Respiratory Rate (breaths/min): 20 Body Mass Index (BMI): 30.1 Blood Pressure (mmHg): 156/88 Reference Range: 80 - 120 mg / dl Electronic Signature(s) Signed: 11/23/2021 3:58:04 PM By: Lorrin Jackson Entered By: Lorrin Jackson on 11/23/2021 13:36:39

## 2021-11-24 NOTE — Progress Notes (Signed)
[name], [name] ([id number]) Visit Report for 11/23/2021 Chief Complaint Document Details Patient Name: Date of Service: [name] [name] 11/23/2021 1:45 PM Medical Record Number: [id number] Patient Account Number: [id number] Date of Birth/Sex: Treating RN: Nov 29, 1960 (61 [name].o. [name] [name] Primary Care Provider: Fonnie [name] Other Clinician: Referring Provider: Treating Provider/Extender: [name] [name] in Treatment: 8 Information Obtained from: Patient Chief Complaint Bilateral lower extremity wounds Electronic Signature(s) Signed: 11/23/2021 2:00:08 PM By: [name] Shan DO Entered By: [name] [name] on 11/23/2021 13:54:35 -------------------------------------------------------------------------------- HPI Details Patient Name: Date of Service: [name] [name] 11/23/2021 1:45 PM Medical Record Number: [id number] Patient Account Number: [id number] Date of Birth/Sex: Treating RN: October 13, 1961 (67 [name].o. 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 He states the wounds have been present for the past 6 months and wax and wane in healing. He has been seen by vein and vascular for this issue. He has been treated with Unna boots with benefit. He states he started compression stockings when there was 1 wound remaining and now has developed multiple wounds. He currently takes 20 mg of Lasix and has been doing so for years. He states he has been on several rounds of antibiotics for this issue. He last took Bactrim and completed this a couple days  ago. He currently denies signs of infection. 11/29; patient presents for 1 week follow-up. He states that 4 days ago he felt burning to his legs bilaterally and removed the compression wraps. He saw vein and vascular yesterday and was placed in an Unna boot to the left leg. T oday He reports increased redness to the left leg. 12/6; patient presents for follow-up. He has no issues or complaints today. He completed his antibiotics and reports improvement in symptoms. At the last nurse visit since he had done well with Kerlix and Coban he was increased to 3 layer compression wrap. Over the past few days he has tolerated this well. He currently denies signs of infection. 12/13; patient presents for follow-up. He has no issues or complaints today. He has tolerated the 3 layer compression wrap well. He denies signs of infection. 12/20; patient presents for follow-up. He took the compression wraps off yesterday. He wanted to shower. He denies signs of infection. 1/3; patient presents for follow-up. Has no issues or complaints today. 1/17; patient presents for follow-up. He has been wearing his compression stocking daily to the right lower extremity without issues. He states that the wounds have remained closed to the right lower extremity. He has tolerated the compression wrap well to the left lower extremity. He has no issues or complaints today. Electronic Signature(s) Signed: 11/23/2021 2:00:08 PM By: [name] Shan DO Entered By: [name] [name] on 11/23/2021 13:55:10 -------------------------------------------------------------------------------- Physical Exam Details Patient Name: Date of Service: [name] [name] 11/23/2021 1:45 PM Medical Record Number: [id number] Patient Account Number: [id number] Date of Birth/Sex: Treating RN: 06/23/1961 (37 [name].o. [name] [name] Primary Care Provider: Fonnie [name] Other Clinician: Referring Provider: Treating Provider/Extender: [name] [name] in Treatment: 8 Constitutional respirations regular, non-labored and within target range for patient.. Cardiovascular 2+ dorsalis pedis/posterior tibialis pulses. Psychiatric pleasant and cooperative. Notes  Right lower extremity: Epithelialization to the previous wound site Left lower extremity: Small open wound limited to skin breakdown. Appears well-healing. Good edema control. Venous stasis dermatitis bilaterally. Electronic Signature(s) Signed: 11/23/2021 2:00:08 PM By: [name] Shan DO Entered By: [name] [name] on 11/23/2021 13:56:14 -------------------------------------------------------------------------------- Physician Orders Details Patient Name: Date of Service: [name] [name] 11/23/2021 1:45 PM Medical Record Number: [id number] Patient Account Number: [id number] Date of Birth/Sex: Treating RN: [birth year] (19 [name].o. [name] [name] Primary Care Provider: Fonnie [name] Other Clinician: Referring Provider: Treating Provider/Extender: [name] [name] in Treatment: 8 Verbal / Phone Orders: No Diagnosis Coding ICD-10 Coding Code Description I87.313 Chronic venous hypertension (idiopathic) with ulcer of bilateral lower extremity L97.812 Non-pressure chronic ulcer of other part of right lower leg with fat layer exposed L97.822 Non-pressure chronic ulcer of other part of left lower leg with fat layer exposed F17.210 Nicotine dependence, cigarettes, uncomplicated W09 Essential (primary) hypertension J44.9 Chronic obstructive pulmonary disease, unspecified Follow-up Appointments ppointment in 1 week. - Dr. Heber [place] Tuesday Return A Bathing/ Shower/ Hygiene May shower with protection but do not get wound dressing(s) wet. - ***Do not remove the wraps.*** At appt times will remove wraps and wash legs.*** Edema Control - Lymphedema / SCD / Other Elevate legs to the level of the heart or above for 30 minutes daily  and/or when sitting, a frequency of: - 3-4 times a [place] throughout the [place]. Avoid standing for long periods of time. Patient to wear own compression stockings every [place]. - Wear 20-30 Compression stocking to right leg. Exercise regularly Additional Orders / Instructions Stop/Decrease Smoking Wound Treatment Wound #1 - Lower Leg Wound Laterality: Left, Anterior Cleanser: Soap and Water 1 x Per Week Discharge Instructions: May shower and wash wound with dial antibacterial soap and water prior to dressing change. Cleanser: Wound Cleanser 1 x Per Week Discharge Instructions: Cleanse the wound with wound cleanser prior to applying a clean dressing using gauze sponges, not tissue or cotton balls. Peri-Wound Care: Triamcinolone 15 (g) 1 x Per Week Discharge Instructions: Use triamcinolone 15 (g) as directed Peri-Wound Care: Sween Lotion (Moisturizing lotion) 1 x Per Week Discharge Instructions: Apply moisturizing lotion as directed Prim Dressing: Maxorb Extra Calcium Alginate 2x2 in 1 x Per Week ary Discharge Instructions: Apply calcium alginate to wound bed as instructed Secondary Dressing: Woven Gauze Sponge, Non-Sterile 4x4 in 1 x Per Week Discharge Instructions: Apply over primary dressing as directed. Compression Wrap: ThreePress (3 layer compression wrap) 1 x Per Week Discharge Instructions: Apply three layer compression as directed. Electronic Signature(s) Signed: 11/23/2021 2:00:08 PM By: [name] Shan DO Entered By: [name] [name] on 11/23/2021 13:56:31 -------------------------------------------------------------------------------- Problem List Details Patient Name: Date of Service: [name] [name] 11/23/2021 1:45 PM Medical Record Number: [id number] Patient Account Number: [id number] Date of Birth/Sex: Treating RN: 15-Sep-1961 (89 [name].o. [name] [name] Primary Care Provider: Fonnie [name] Other Clinician: Referring Provider: Treating Provider/Extender: [name] [name] in Treatment: 8 Active Problems ICD-10 Encounter Code Description Active Date MDM Diagnosis I87.313 Chronic venous hypertension (idiopathic) with ulcer of bilateral lower extremity 09/28/2021 No Yes L97.812 Non-pressure chronic ulcer of other part of right lower leg with fat layer 09/28/2021 No Yes exposed L97.822 Non-pressure chronic ulcer of other part of left lower leg with fat layer exposed11/22/2022 No Yes F17.210 Nicotine dependence, cigarettes, uncomplicated 95/62/1308 No Yes I10 Essential (primary) hypertension 09/28/2021 No Yes J44.9 Chronic obstructive pulmonary disease, unspecified 09/28/2021 No Yes Inactive Problems Resolved Problems Electronic Signature(s) Signed: 11/23/2021 2:00:08  PM By: [name] Shan DO Entered By: [name] [name] on 11/23/2021 13:54:03 -------------------------------------------------------------------------------- Progress Note Details Patient Name: Date of Service: [name] [name] 11/23/2021 1:45 PM Medical Record Number: [id number] Patient Account Number: [id number] Date of Birth/Sex: Treating RN: June 14, 1961 (29 [name].o. [name] [name] Primary Care Provider: Fonnie [name] Other Clinician: Referring Provider: Treating Provider/Extender: [name] [name] in Treatment: 8 Subjective Chief Complaint Information obtained from Patient Bilateral lower extremity wounds History of Present Illness (HPI) Admission 09/28/2021 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 He states the wounds have been present for the past 6 months and wax and wane in healing. He has been seen by vein and vascular for this issue. He has been treated with Unna boots with benefit. He states he started compression stockings when there  was 1 wound remaining and now has developed multiple wounds. He currently takes 20 mg of Lasix and has been doing so for years. He states he has been on several rounds of antibiotics for this issue. He last took Bactrim and completed this a couple days ago. He currently denies signs of infection. 11/29; patient presents for 1 week follow-up. He states that 4 days ago he felt burning to his legs bilaterally and removed the compression wraps. He saw vein and vascular yesterday and was placed in an Unna boot to the left leg. T oday He reports increased redness to the left leg. 12/6; patient presents for follow-up. He has no issues or complaints today. He completed his antibiotics and reports improvement in symptoms. At the last nurse visit since he had done well with Kerlix and Coban he was increased to 3 layer compression wrap. Over the past few days he has tolerated this well. He currently denies signs of infection. 12/13; patient presents for follow-up. He has no issues or complaints today. He has tolerated the 3 layer compression wrap well. He denies signs of infection. 12/20; patient presents for follow-up. He took the compression wraps off yesterday. He wanted to shower. He denies signs of infection. 1/3; patient presents for follow-up. Has no issues or complaints today. 1/17; patient presents for follow-up. He has been wearing his compression stocking daily to the right lower extremity without issues. He states that the wounds have remained closed to the right lower extremity. He has tolerated the compression wrap well to the left lower extremity. He has no issues or complaints today. Patient History Information obtained from Patient, Chart. Family History Cancer - Mother, Diabetes - Father,Siblings, Heart Disease - Mother, Hypertension - Mother, No family history of Kidney Disease, Lung Disease, Seizures, Stroke, Thyroid Problems, Tuberculosis. Social History Current every [place] smoker - 1 1/2  ppd, Marital Status - Divorced, Alcohol Use - Never, Drug Use - No History, Caffeine Use - Daily - coffee. Medical History Eyes Denies history of Cataracts, Glaucoma, Optic Neuritis Ear/Nose/Mouth/Throat Denies history of Chronic sinus problems/congestion, Middle ear problems Hematologic/Lymphatic Denies history of Anemia, Hemophilia, Human Immunodeficiency Virus, Lymphedema, Sickle Cell Disease Respiratory Patient has history of Chronic Obstructive Pulmonary Disease (COPD) Denies history of Aspiration, Asthma, Pneumothorax, Sleep Apnea, Tuberculosis Cardiovascular Patient has history of Deep Vein Thrombosis, Hypertension, Peripheral Venous Disease Denies history of Angina, Arrhythmia, Congestive Heart Failure, Coronary Artery Disease, Hypotension, Myocardial Infarction, Peripheral Arterial Disease, Phlebitis, Vasculitis Gastrointestinal Denies history of Cirrhosis , Colitis, Crohnoos, Hepatitis A,  Hepatitis B, Hepatitis C Endocrine Denies history of Type I Diabetes, Type II Diabetes Genitourinary Denies history of End Stage Renal Disease Immunological Denies history of Lupus Erythematosus, Raynaudoos, Scleroderma Integumentary (Skin) Denies history of History of Burn Musculoskeletal Denies history of Gout, Rheumatoid Arthritis, Osteoarthritis, Osteomyelitis Neurologic Patient has history of Neuropathy - left sciatic nerve pain Denies history of Dementia, Quadriplegia, Paraplegia, Seizure Disorder Oncologic Denies history of Received Chemotherapy, Received Radiation Psychiatric Denies history of Anorexia/bulimia, Confinement Anxiety Hospitalization/Surgery History - Spinal Surgery over 20 years ago. Medical A Surgical History Notes nd Constitutional Symptoms (General Health) Stroke 2016 Migraine Cardiovascular PE Psychiatric Anxiety Objective Constitutional respirations regular, non-labored and within target range for patient.. Vitals Time Taken: 1:36 PM, Height: 71  in, Weight: 216 lbs, BMI: 30.1, Temperature: 98.2 F, Pulse: 75 bpm, Respiratory Rate: 20 breaths/min, Blood Pressure: 156/88 mmHg. Cardiovascular 2+ dorsalis pedis/posterior tibialis pulses. Psychiatric pleasant and cooperative. General Notes: Right lower extremity: Epithelialization to the previous wound site Left lower extremity: Small open wound limited to skin breakdown. Appears well-healing. Good edema control. Venous stasis dermatitis bilaterally. Integumentary (Hair, Skin) Wound #1 status is Open. Original cause of wound was Gradually Appeared. The date acquired was: 09/14/2021. The wound has been in treatment 8 weeks. The wound is located on the Left,Anterior Lower Leg. The wound measures 0.1cm length x 0.1cm width x 0.1cm depth; 0.008cm^2 area and 0.001cm^3 volume. There is Fat Layer (Subcutaneous Tissue) exposed. There is no tunneling or undermining noted. There is a medium amount of serosanguineous drainage noted. The wound margin is flat and intact. There is large (67-100%) red granulation within the wound bed. There is no necrotic tissue within the wound bed. Wound #3 status is Healed - Epithelialized. Original cause of wound was Gradually Appeared. The date acquired was: 09/14/2021. The wound has been in treatment 8 weeks. The wound is located on the Right,Anterior Lower Leg. The wound measures 0cm length x 0cm width x 0cm depth; 0cm^2 area and 0cm^3 volume. Assessment Active Problems ICD-10 Chronic venous hypertension (idiopathic) with ulcer of bilateral lower extremity Non-pressure chronic ulcer of other part of right lower leg with fat layer exposed Non-pressure chronic ulcer of other part of left lower leg with fat layer exposed Nicotine dependence, cigarettes, uncomplicated Essential (primary) hypertension Chronic obstructive pulmonary disease, unspecified Patient's right lower extremity wound is healed. He has been using his compression stocking for the past week with no  issues. I recommended continuing this. There is a small open wound present to the left lower extremity that is almost closed. I recommended silver alginate under compression therapy. Procedures Wound #1 Pre-procedure diagnosis of Wound #1 is a Venous Leg Ulcer located on the Left,Anterior Lower Leg . There was a Three Layer Compression Therapy Procedure by Lorrin Jackson, RN. Post procedure Diagnosis Wound #1: Same as Pre-Procedure Plan Follow-up Appointments: Return Appointment in 1 week. - Dr. Heber [place] Tuesday Bathing/ Shower/ Hygiene: May shower with protection but do not get wound dressing(s) wet. - ***Do not remove the wraps.*** At appt times will remove wraps and wash legs.*** Edema Control - Lymphedema / SCD / Other: Elevate legs to the level of the heart or above for 30 minutes daily and/or when sitting, a frequency of: - 3-4 times a [place] throughout the [place]. Avoid standing for long periods of time. Patient to wear own compression stockings every [place]. - Wear 20-30 Compression stocking to right leg. Exercise regularly Additional Orders / Instructions: Stop/Decrease Smoking WOUND #1: - Lower Leg Wound Laterality: Left, Anterior  Cleanser: Soap and Water 1 x Per Week/ Discharge Instructions: May shower and wash wound with dial antibacterial soap and water prior to dressing change. Cleanser: Wound Cleanser 1 x Per Week/ Discharge Instructions: Cleanse the wound with wound cleanser prior to applying a clean dressing using gauze sponges, not tissue or cotton balls. Peri-Wound Care: Triamcinolone 15 (g) 1 x Per Week/ Discharge Instructions: Use triamcinolone 15 (g) as directed Peri-Wound Care: Sween Lotion (Moisturizing lotion) 1 x Per Week/ Discharge Instructions: Apply moisturizing lotion as directed Prim Dressing: Maxorb Extra Calcium Alginate 2x2 in 1 x Per Week/ ary Discharge Instructions: Apply calcium alginate to wound bed as instructed Secondary Dressing: Woven Gauze Sponge,  Non-Sterile 4x4 in 1 x Per Week/ Discharge Instructions: Apply over primary dressing as directed. Com pression Wrap: ThreePress (3 layer compression wrap) 1 x Per Week/ Discharge Instructions: Apply three layer compression as directed. 1. Compression stocking to the right leg daily 2. Silver alginate under 3 layer compression to the left lower extremity 3. Follow-up in 1 week Electronic Signature(s) Signed: 11/23/2021 2:00:08 PM By: [name] Shan DO Entered By: [name] [name] on 11/23/2021 13:59:17 -------------------------------------------------------------------------------- HxROS Details Patient Name: Date of Service: [name] [name] 11/23/2021 1:45 PM Medical Record Number: [id number] Patient Account Number: [id number] Date of Birth/Sex: Treating RN: 1961/09/02 (47 [name].o. [name] [name] Primary Care Provider: Fonnie [name] Other Clinician: Referring Provider: Treating Provider/Extender: [name] [name] in Treatment: 8 Information Obtained From Patient Chart Constitutional Symptoms (General Health) Medical History: Past Medical History Notes: Stroke 2016 Migraine Eyes Medical History: Negative for: Cataracts; Glaucoma; Optic Neuritis Ear/Nose/Mouth/Throat Medical History: Negative for: Chronic sinus problems/congestion; Middle ear problems Hematologic/Lymphatic Medical History: Negative for: Anemia; Hemophilia; Human Immunodeficiency Virus; Lymphedema; Sickle Cell Disease Respiratory Medical History: Positive for: Chronic Obstructive Pulmonary Disease (COPD) Negative for: Aspiration; Asthma; Pneumothorax; Sleep Apnea; Tuberculosis Cardiovascular Medical History: Positive for: Deep Vein Thrombosis; Hypertension; Peripheral Venous Disease Negative for: Angina; Arrhythmia; Congestive Heart Failure; Coronary Artery Disease; Hypotension; Myocardial Infarction; Peripheral Arterial Disease; Phlebitis; Vasculitis Past Medical History  Notes: PE Gastrointestinal Medical History: Negative for: Cirrhosis ; Colitis; Crohns; Hepatitis A; Hepatitis B; Hepatitis C Endocrine Medical History: Negative for: Type I Diabetes; Type II Diabetes Genitourinary Medical History: Negative for: End Stage Renal Disease Immunological Medical History: Negative for: Lupus Erythematosus; Raynauds; Scleroderma Integumentary (Skin) Medical History: Negative for: History of Burn Musculoskeletal Medical History: Negative for: Gout; Rheumatoid Arthritis; Osteoarthritis; Osteomyelitis Neurologic Medical History: Positive for: Neuropathy - left sciatic nerve pain Negative for: Dementia; Quadriplegia; Paraplegia; Seizure Disorder Oncologic Medical History: Negative for: Received Chemotherapy; Received Radiation Psychiatric Medical History: Negative for: Anorexia/bulimia; Confinement Anxiety Past Medical History Notes: Anxiety Immunizations Pneumococcal Vaccine: Received Pneumococcal Vaccination: No Implantable Devices No devices added Hospitalization / Surgery History Type of Hospitalization/Surgery Spinal Surgery over 20 years ago Family and Social History Cancer: Yes - Mother; Diabetes: Yes - Father,Siblings; Heart Disease: Yes - Mother; Hypertension: Yes - Mother; Kidney Disease: No; Lung Disease: No; Seizures: No; Stroke: No; Thyroid Problems: No; Tuberculosis: No; Current every [place] smoker - 1 1/2 ppd; Marital Status - Divorced; Alcohol Use: Never; Drug Use: No History; Caffeine Use: Daily - coffee; Financial Concerns: No; Food, Clothing or Shelter Needs: No; Support System Lacking: No; Transportation Concerns: No Electronic Signature(s) Signed: 11/23/2021 2:00:08 PM By: [name] Shan DO Signed: 11/24/2021 12:02:35 PM By: Deon Pilling RN, BSN Entered By: [name] [name] on 11/23/2021 13:55:18 -------------------------------------------------------------------------------- SuperBill Details Patient Name: Date of  Service: [name] [name], [name] [name] 11/23/2021 Medical Record Number:  [id number] Patient Account Number: [id number] Date of Birth/Sex: Treating RN: November 09, 1960 (41 [name].o. [name] [name] Primary Care Provider: Fonnie [name] Other Clinician: Referring Provider: Treating Provider/Extender: [name] [name] in Treatment: 8 Diagnosis Coding ICD-10 Codes Code Description [phone] Chronic venous hypertension (idiopathic) with ulcer of bilateral lower extremity L97.812 Non-pressure chronic ulcer of other part of right lower leg with fat layer exposed L97.822 Non-pressure chronic ulcer of other part of left lower leg with fat layer exposed F17.210 Nicotine dependence, cigarettes, uncomplicated K06 Essential (primary) hypertension J44.9 Chronic obstructive pulmonary disease, unspecified Facility Procedures CPT4 Code: [id number] Description: (Facility Use Only) [phone] - [place] L[facility] LT LEG ICD-10 Diagnosis Description L97.822 Non-pressure chronic ulcer of other part of left lower leg with fat layer exposed Modifier: Quantity: 1 Physician Procedures Electronic Signature(s) Signed: 11/23/2021 2:00:08 PM By: [name] Shan DO Entered By: [name] [name] on 11/23/2021 13:59:45

## 2021-11-30 ENCOUNTER — Encounter (HOSPITAL_BASED_OUTPATIENT_CLINIC_OR_DEPARTMENT_OTHER): Payer: Medicaid Other | Admitting: Internal Medicine

## 2021-12-08 DIAGNOSIS — Z419 Encounter for procedure for purposes other than remedying health state, unspecified: Secondary | ICD-10-CM | POA: Diagnosis not present

## 2021-12-13 ENCOUNTER — Ambulatory Visit: Payer: Medicaid Other

## 2022-01-05 DIAGNOSIS — Z419 Encounter for procedure for purposes other than remedying health state, unspecified: Secondary | ICD-10-CM | POA: Diagnosis not present

## 2022-01-07 DIAGNOSIS — F1129 Opioid dependence with unspecified opioid-induced disorder: Secondary | ICD-10-CM | POA: Diagnosis not present

## 2022-01-11 ENCOUNTER — Emergency Department (HOSPITAL_COMMUNITY)
Admission: EM | Admit: 2022-01-11 | Discharge: 2022-01-11 | Disposition: A | Discharge status: Home / Self Care | Payer: Medicaid Other | Attending: Emergency Medicine | Admitting: Emergency Medicine

## 2022-01-11 ENCOUNTER — Other Ambulatory Visit: Payer: Self-pay

## 2022-01-11 ENCOUNTER — Encounter (HOSPITAL_COMMUNITY): Payer: Self-pay | Admitting: Emergency Medicine

## 2022-01-11 DIAGNOSIS — M79605 Pain in left leg: Secondary | ICD-10-CM | POA: Diagnosis not present

## 2022-01-11 DIAGNOSIS — R1084 Generalized abdominal pain: Secondary | ICD-10-CM | POA: Diagnosis not present

## 2022-01-11 DIAGNOSIS — Z79899 Other long term (current) drug therapy: Secondary | ICD-10-CM | POA: Insufficient documentation

## 2022-01-11 DIAGNOSIS — Z20822 Contact with and (suspected) exposure to covid-19: Secondary | ICD-10-CM | POA: Diagnosis not present

## 2022-01-11 DIAGNOSIS — R109 Unspecified abdominal pain: Secondary | ICD-10-CM | POA: Diagnosis not present

## 2022-01-11 DIAGNOSIS — R112 Nausea with vomiting, unspecified: Secondary | ICD-10-CM | POA: Diagnosis not present

## 2022-01-11 DIAGNOSIS — I10 Essential (primary) hypertension: Secondary | ICD-10-CM | POA: Diagnosis not present

## 2022-01-11 DIAGNOSIS — Z743 Need for continuous supervision: Secondary | ICD-10-CM | POA: Diagnosis not present

## 2022-01-11 HISTORY — DX: Opioid abuse, uncomplicated: F11.10

## 2022-01-11 LAB — CBC
HCT: 48.1 % (ref 39.0–52.0)
Hemoglobin: 15.9 g/dL (ref 13.0–17.0)
MCH: 31.4 pg (ref 26.0–34.0)
MCHC: 33.1 g/dL (ref 30.0–36.0)
MCV: 95.1 fL (ref 80.0–100.0)
Platelets: 249 10*3/uL (ref 150–400)
RBC: 5.06 MIL/uL (ref 4.22–5.81)
RDW: 12.4 % (ref 11.5–15.5)
WBC: 10.2 10*3/uL (ref 4.0–10.5)
nRBC: 0 % (ref 0.0–0.2)

## 2022-01-11 LAB — RESP PANEL BY RT-PCR (FLU A&B, COVID) ARPGX2
Influenza A by PCR: NEGATIVE
Influenza B by PCR: NEGATIVE
SARS Coronavirus 2 by RT PCR: NEGATIVE

## 2022-01-11 LAB — URINALYSIS, ROUTINE W REFLEX MICROSCOPIC
Bilirubin Urine: NEGATIVE
Glucose, UA: NEGATIVE mg/dL
Hgb urine dipstick: NEGATIVE
Ketones, ur: NEGATIVE mg/dL
Leukocytes,Ua: NEGATIVE
Nitrite: NEGATIVE
Protein, ur: NEGATIVE mg/dL
Specific Gravity, Urine: 1.016 (ref 1.005–1.030)
pH: 9 — ABNORMAL HIGH (ref 5.0–8.0)

## 2022-01-11 LAB — LIPASE, BLOOD: Lipase: 28 U/L (ref 11–51)

## 2022-01-11 LAB — COMPREHENSIVE METABOLIC PANEL
ALT: 16 U/L (ref 0–44)
AST: 16 U/L (ref 15–41)
Albumin: 3.8 g/dL (ref 3.5–5.0)
Alkaline Phosphatase: 105 U/L (ref 38–126)
Anion gap: 10 (ref 5–15)
BUN: 13 mg/dL (ref 6–20)
CO2: 19 mmol/L — ABNORMAL LOW (ref 22–32)
Calcium: 8.7 mg/dL — ABNORMAL LOW (ref 8.9–10.3)
Chloride: 106 mmol/L (ref 98–111)
Creatinine, Ser: 0.62 mg/dL (ref 0.61–1.24)
GFR, Estimated: >60 mL/min (ref >60)
Glucose, Bld: 107 mg/dL — ABNORMAL HIGH (ref 70–99)
Potassium: 3.5 mmol/L (ref 3.5–5.1)
Sodium: 135 mmol/L (ref 135–145)
Total Bilirubin: 0.5 mg/dL (ref 0.3–1.2)
Total Protein: 7.2 g/dL (ref 6.5–8.1)

## 2022-01-11 MED ORDER — SODIUM CHLORIDE 0.9 % IV BOLUS
1000.0000 mL | Freq: Once | INTRAVENOUS | Status: AC
  Administered 2022-01-11: 1000 mL via INTRAVENOUS

## 2022-01-11 MED ORDER — ONDANSETRON HCL 4 MG/2ML IJ SOLN
4.0000 mg | Freq: Once | INTRAMUSCULAR | Status: AC | PRN
  Administered 2022-01-11: 4 mg via INTRAVENOUS
  Filled 2022-01-11: qty 2

## 2022-01-11 MED ORDER — LORAZEPAM 2 MG/ML IJ SOLN
1.0000 mg | Freq: Once | INTRAMUSCULAR | Status: AC
  Administered 2022-01-11: 1 mg via INTRAVENOUS
  Filled 2022-01-11: qty 1

## 2022-01-11 NOTE — ED Notes (Signed)
Pt co pain to right chest radiating to left shoulder   Denies sob/dizziness.  Pt non diaphoretic. Edp aware ?

## 2022-01-11 NOTE — Discharge Instructions (Signed)
Please follow-up with your Suboxone clinic for possible medication adjustments.  Please stop taking illicit drugs. ?

## 2022-01-11 NOTE — ED Triage Notes (Signed)
Pt called ems with n/v. States took a suboxone for the first time in 4 months. But has had other opioids.  C/o left leg cold/irritated and hurting. Pt arrived a/o and dry heaving.cbg 109 ?

## 2022-01-11 NOTE — ED Provider Notes (Signed)
[facility] EMERGENCY DEPARTMENT Provider Note   CSN: [id number] Arrival date & time: 01/11/22  1319     History Chief Complaint  Patient presents with   Emesis    [name] [name] is a 61 y.o. male with history of chronic opioid abuse and dependence who presents to the emergency department with nausea and vomiting that started after taking his first dose of Suboxone after a while today.  Patient states that he has not taken any opioids intranasally in the last 3 days.  Patient took his Suboxone and then began feeling generally weak with malaise and began having nausea and vomiting.  He is complaining of generalized pain.  No fever or chills.  This is not a new medication for him and he has been taking this for years.   Emesis     Home Medications Prior to Admission medications   Medication Sig Start Date End Date Taking? Authorizing Provider  albuterol (PROVENTIL HFA;VENTOLIN HFA) 108 (90 BASE) MCG/ACT inhaler Inhale 2 puffs into the lungs every 4 (four) hours as needed for wheezing. Reported on 04/11/2016    [provider]  amLODipine (NORVASC) 10 MG tablet Take 1 tablet (10 mg total) by mouth daily. 01/21/16   Leone Brand, MD  atorvastatin (LIPITOR) 80 MG tablet TAKE 1 TABLET (80 MG TOTAL) BY MOUTH DAILY AT 6 PM. Patient taking differently: Take 80 mg by mouth daily. 03/09/16   Leone Brand, MD  Azelastine HCl 137 MCG/SPRAY SOLN Place 1 spray into both nostrils 2 (two) times daily. 06/07/21   [provider]  finasteride (PROSCAR) 5 MG tablet Take 1 tablet (5 mg total) by mouth daily. 03/09/16   Leone Brand, MD  furosemide (LASIX) 20 MG tablet Take 20 mg by mouth in the morning and at bedtime. 05/21/21   [provider]  lisinopril (PRINIVIL,ZESTRIL) 40 MG tablet Take 1 tablet (40 mg total) by mouth daily. 03/09/16   Leone Brand, MD  pregabalin (LYRICA) 300 MG capsule Take 1 capsule (300 mg total) by mouth 2 (two) times daily. Patient taking differently:  Take 150 mg by mouth in the morning, at noon, in the evening, and at bedtime. 05/30/16   Katheren Shams, DO  propranolol (INDERAL) 20 MG tablet Take 1 tablet (20 mg total) by mouth 3 (three) times daily. 03/21/16   Leone Brand, MD  rivaroxaban (XARELTO) 20 MG TABS tablet TAKE 1 TABLET BY MOUTH EVERY [place] WITH SUPPER Patient taking differently: Take 20 mg by mouth daily with supper. 03/09/16   Leone Brand, MD  SUBOXONE 8-2 MG FILM Place 1 strip under the tongue 3 (three) times daily. 06/02/21   [provider]  sulfamethoxazole-trimethoprim (BACTRIM) 400-80 MG tablet Take 1 tablet by mouth 2 (two) times daily. 09/13/21   Serafina Mitchell, MD      Allergies    Amlodipine, Fentanyl, and Methadone    Review of Systems   Review of Systems  Gastrointestinal:  Positive for vomiting.  All other systems reviewed and are negative.  Physical Exam Updated Vital Signs BP (!) 190/118    Pulse 93    Temp 98.7 F (37.1 C) (Oral)    Resp 18    SpO2 96%  Physical Exam Vitals and nursing note reviewed.  Constitutional:      General: He is not in acute distress.    Appearance: Normal appearance.     Comments: Rolling back and forth.  HENT:     Head:  Normocephalic and atraumatic.  Eyes:     General:        Right eye: No discharge.        Left eye: No discharge.  Cardiovascular:     Comments: Regular rate and rhythm.  S1/S2 are distinct without any evidence of murmur, rubs, or gallops.  Radial pulses are 2+ bilaterally.  Dorsalis pedis pulses are 2+ bilaterally.  No evidence of pedal edema. Pulmonary:     Comments: Clear to auscultation bilaterally.  Normal effort.  No respiratory distress.  No evidence of wheezes, rales, or rhonchi heard throughout. Abdominal:     General: Abdomen is flat. Bowel sounds are normal. There is no distension.     Tenderness: There is generalized abdominal tenderness. There is no guarding or rebound.  Musculoskeletal:        General: Normal range of motion.      Cervical back: Neck supple.  Skin:    General: Skin is warm and dry.     Findings: No rash.  Neurological:     General: No focal deficit present.     Mental Status: He is alert.  Psychiatric:        Mood and Affect: Mood normal.        Behavior: Behavior normal.    ED Results / Procedures / Treatments   Labs (all labs ordered are listed, but only abnormal results are displayed) Labs Reviewed  URINALYSIS, ROUTINE W REFLEX MICROSCOPIC - Abnormal; Notable for the following components:      Result Value   APPearance HAZY (*)    pH 9.0 (*)    All other components within normal limits  COMPREHENSIVE METABOLIC PANEL - Abnormal; Notable for the following components:   CO2 19 (*)    Glucose, Bld 107 (*)    Calcium 8.7 (*)    All other components within normal limits  RESP PANEL BY RT-PCR (FLU A&B, COVID) ARPGX2  CBC  LIPASE, BLOOD    EKG None  Radiology No results found.  Procedures Procedures    Medications Ordered in ED Medications  ondansetron (ZOFRAN) injection 4 mg (4 mg Intravenous Given 01/11/22 1348)  sodium chloride 0.9 % bolus 1,000 mL (1,000 mLs Intravenous New Bag/Given 01/11/22 1457)  LORazepam (ATIVAN) injection 1 mg (1 mg Intravenous Given 01/11/22 1600)    ED Course/ Medical Decision Making/ A&P                           Medical Decision Making Risk Prescription drug management.   This patient presents to the ED for concern of nausea and vomiting, this involves an extensive number of treatment options, and is a complaint that carries with it a high risk of complications and morbidity.  The differential diagnosis includes gastroenteritis, opioid withdrawal, other alcohol or drug intoxication.   Co morbidities that complicate the patient evaluation  Opioid abuse Hypertension   Additional history obtained:  Additional history obtained from old records and nursing note External records from outside source obtained and reviewed including visit on  01/07/2022 with his primary care doctor were they reviewed his Suboxone prescription.   Lab Tests:  I Ordered, and personally interpreted labs.  The pertinent results include: CBC which was without any evidence of leukocytosis.  CMP was essentially normal.  COVID and flu were negative.  UA without infection.  Lipase negative.  I doubt pancreatitis.   Cardiac Monitoring:  The patient was maintained on a cardiac monitor.  I personally viewed and interpreted the cardiac monitored which showed an underlying rhythm of: Normal sinus rhythm   Medicines ordered and prescription drug management:  I ordered medication including bolus of fluid for presumed dehydration.  Ativan for anxiousness likely opioid withdrawal.  Zofran for nausea and vomiting Reevaluation of the patient after these medicines showed that the patient improved I have reviewed the patients home medicines and have made adjustments as needed   Test Considered:  CT abdomen pelvis with contrast.  I have a low suspicion that this is any emergent abdominal pathology considering the history and time course.  This is likely opioid withdrawal that he is experiencing as he has not had any opioids in the last 3 days and took Suboxone today.   Critical Interventions:  Fluids Ativan Zofran for nausea   Problem List / ED Course:  Nausea vomiting likely secondary to opioid withdrawal.  Patient went on a bender few days ago and was snorting what he believes was opioids.  Upon further questioning, he is not sure what exactly he took.  Patient then waited 3 days that started his Suboxone and then promptly had nausea and vomiting and general malaise and weakness.  I have a low suspicion for acute abdomen at this time.  Patient has had no vomiting since given Zofran in the department.  Patient was still complaining of generalized pain and appeared uncomfortable and would not sit still.  Ativan was given which improved his symptoms.  I also gave  him a liter of fluid which also improved.  Patient is currently resting in the department and asking for transportation home.  I will have him follow-up with his Suboxone clinic and I advised him to stop taking illicit drugs.  Patient understood.  Strict return precautions were given.  He is safe for discharge.   Reevaluation:  After the interventions noted above, I reevaluated the patient and found that they have :improved   Dispostion:  After consideration of the diagnostic results and the patients response to treatment, I feel that the patent would benefit from outpatient follow-up in the Suboxone clinic and his primary care provider.  We arranged transportation to get the patient home.  All questions and concerns addressed.  He is safe for discharge.  Final Clinical Impression(s) / ED Diagnoses Final diagnoses:  Nausea and vomiting, unspecified vomiting type    Rx / DC Orders ED Discharge Orders     None         Cherrie Gauze 01/11/22 Lillie Columbia, MD 01/11/22 [phone]

## 2022-01-11 NOTE — ED Notes (Signed)
Patient Alert and oriented to baseline. Stable and ambulatory to baseline. Patient verbalized understanding of the discharge instructions.  Patient belongings were taken by the patient.   

## 2022-01-27 ENCOUNTER — Encounter (HOSPITAL_BASED_OUTPATIENT_CLINIC_OR_DEPARTMENT_OTHER): Payer: Medicaid Other | Admitting: General Surgery

## 2022-02-05 DIAGNOSIS — G629 Polyneuropathy, unspecified: Secondary | ICD-10-CM | POA: Diagnosis not present

## 2022-02-05 DIAGNOSIS — Z7689 Persons encountering health services in other specified circumstances: Secondary | ICD-10-CM | POA: Diagnosis not present

## 2022-02-05 DIAGNOSIS — F1113 Opioid abuse with withdrawal: Secondary | ICD-10-CM | POA: Diagnosis not present

## 2022-02-05 DIAGNOSIS — Z419 Encounter for procedure for purposes other than remedying health state, unspecified: Secondary | ICD-10-CM | POA: Diagnosis not present

## 2022-02-05 DIAGNOSIS — I10 Essential (primary) hypertension: Secondary | ICD-10-CM | POA: Diagnosis not present

## 2022-03-05 DIAGNOSIS — F1113 Opioid abuse with withdrawal: Secondary | ICD-10-CM | POA: Diagnosis not present

## 2022-03-05 DIAGNOSIS — G629 Polyneuropathy, unspecified: Secondary | ICD-10-CM | POA: Diagnosis not present

## 2022-03-07 DIAGNOSIS — Z419 Encounter for procedure for purposes other than remedying health state, unspecified: Secondary | ICD-10-CM | POA: Diagnosis not present

## 2022-03-18 DIAGNOSIS — Z7689 Persons encountering health services in other specified circumstances: Secondary | ICD-10-CM | POA: Diagnosis not present

## 2022-04-07 DIAGNOSIS — Z419 Encounter for procedure for purposes other than remedying health state, unspecified: Secondary | ICD-10-CM | POA: Diagnosis not present

## 2022-04-22 ENCOUNTER — Encounter (HOSPITAL_COMMUNITY): Payer: Self-pay | Admitting: Emergency Medicine

## 2022-04-22 ENCOUNTER — Other Ambulatory Visit: Payer: Self-pay

## 2022-04-22 ENCOUNTER — Emergency Department (HOSPITAL_COMMUNITY)
Admission: EM | Admit: 2022-04-22 | Discharge: 2022-04-22 | Disposition: A | Discharge status: Home / Self Care | Payer: Medicaid Other | Attending: Emergency Medicine | Admitting: Emergency Medicine

## 2022-04-22 DIAGNOSIS — Z743 Need for continuous supervision: Secondary | ICD-10-CM | POA: Diagnosis not present

## 2022-04-22 DIAGNOSIS — M79662 Pain in left lower leg: Secondary | ICD-10-CM | POA: Insufficient documentation

## 2022-04-22 DIAGNOSIS — R6889 Other general symptoms and signs: Secondary | ICD-10-CM | POA: Diagnosis not present

## 2022-04-22 DIAGNOSIS — I10 Essential (primary) hypertension: Secondary | ICD-10-CM | POA: Diagnosis not present

## 2022-04-22 DIAGNOSIS — M79673 Pain in unspecified foot: Secondary | ICD-10-CM | POA: Diagnosis not present

## 2022-04-22 DIAGNOSIS — Z5321 Procedure and treatment not carried out due to patient leaving prior to being seen by health care provider: Secondary | ICD-10-CM | POA: Diagnosis not present

## 2022-04-22 NOTE — ED Notes (Signed)
Hessie Dibble, [facility] : [facility] to ED to speak with pt who insists that he be placed in a room immediately.  Ann explained to pt that he is being reassessed and that we will be bedding him as soon as something is available.  Explained to pt that we have multiple critical pts in the department.  Pt states that EMS told him he had no pulse in his foot.  This RN used doppler and was able to obtain DP pulse in foot.  Pt continued to yell and cuss at this RN, tech and [facility].  Pt insisted that the [facility] find him a ride home.  Lelon Frohlich, [facility] told pt that he was free to leave at any time, but that he would have to secure his own ride.  Pt continued to yell and cuss and then tells tech to roll him to the front door.  Lelon Frohlich, [facility] tells tech to take pt back to lobby.  Pt rolled to lobby at this time.

## 2022-04-22 NOTE — ED Triage Notes (Signed)
Pt to ER with c/o pain, swelling and discoloration to left lower extremity for last 3 days.  Pt states this is a chronic intermittent problem.

## 2022-04-26 ENCOUNTER — Emergency Department (HOSPITAL_COMMUNITY)
Admission: EM | Admit: 2022-04-26 | Discharge: 2022-04-27 | Discharge status: Against Medical Advice | Payer: Medicaid Other | Attending: Emergency Medicine | Admitting: Emergency Medicine

## 2022-04-26 ENCOUNTER — Other Ambulatory Visit: Payer: Self-pay

## 2022-04-26 ENCOUNTER — Encounter (HOSPITAL_COMMUNITY): Payer: Self-pay

## 2022-04-26 DIAGNOSIS — M542 Cervicalgia: Secondary | ICD-10-CM | POA: Diagnosis not present

## 2022-04-26 DIAGNOSIS — R519 Headache, unspecified: Secondary | ICD-10-CM | POA: Insufficient documentation

## 2022-04-26 DIAGNOSIS — R202 Paresthesia of skin: Secondary | ICD-10-CM | POA: Insufficient documentation

## 2022-04-26 DIAGNOSIS — R209 Unspecified disturbances of skin sensation: Secondary | ICD-10-CM | POA: Diagnosis not present

## 2022-04-26 DIAGNOSIS — R9431 Abnormal electrocardiogram [ECG] [EKG]: Secondary | ICD-10-CM | POA: Diagnosis not present

## 2022-04-26 DIAGNOSIS — R4589 Other symptoms and signs involving emotional state: Secondary | ICD-10-CM | POA: Diagnosis not present

## 2022-04-26 DIAGNOSIS — Z743 Need for continuous supervision: Secondary | ICD-10-CM | POA: Diagnosis not present

## 2022-04-26 DIAGNOSIS — R52 Pain, unspecified: Secondary | ICD-10-CM | POA: Diagnosis not present

## 2022-04-26 DIAGNOSIS — F29 Unspecified psychosis not due to a substance or known physiological condition: Secondary | ICD-10-CM | POA: Diagnosis not present

## 2022-04-26 DIAGNOSIS — Z5321 Procedure and treatment not carried out due to patient leaving prior to being seen by health care provider: Secondary | ICD-10-CM | POA: Diagnosis not present

## 2022-04-26 LAB — CBC WITH DIFFERENTIAL/PLATELET
Abs Immature Granulocytes: 0.04 10*3/uL (ref 0.00–0.07)
Basophils Absolute: 0.1 10*3/uL (ref 0.0–0.1)
Basophils Relative: 1 %
Eosinophils Absolute: 0.1 10*3/uL (ref 0.0–0.5)
Eosinophils Relative: 1 %
HCT: 48.4 % (ref 39.0–52.0)
Hemoglobin: 17.2 g/dL — ABNORMAL HIGH (ref 13.0–17.0)
Immature Granulocytes: 0 %
Lymphocytes Relative: 37 %
Lymphs Abs: 4 10*3/uL (ref 0.7–4.0)
MCH: 33.3 pg (ref 26.0–34.0)
MCHC: 35.5 g/dL (ref 30.0–36.0)
MCV: 93.6 fL (ref 80.0–100.0)
Monocytes Absolute: 0.6 10*3/uL (ref 0.1–1.0)
Monocytes Relative: 5 %
Neutro Abs: 6 10*3/uL (ref 1.7–7.7)
Neutrophils Relative %: 56 %
Platelets: 240 10*3/uL (ref 150–400)
RBC: 5.17 MIL/uL (ref 4.22–5.81)
RDW: 12.5 % (ref 11.5–15.5)
WBC: 10.8 10*3/uL — ABNORMAL HIGH (ref 4.0–10.5)
nRBC: 0 % (ref 0.0–0.2)

## 2022-04-26 LAB — URINALYSIS, ROUTINE W REFLEX MICROSCOPIC
Bilirubin Urine: NEGATIVE
Glucose, UA: NEGATIVE mg/dL
Ketones, ur: NEGATIVE mg/dL
Leukocytes,Ua: NEGATIVE
Nitrite: NEGATIVE
Protein, ur: NEGATIVE mg/dL
Specific Gravity, Urine: 1.012 (ref 1.005–1.030)
pH: 6 (ref 5.0–8.0)

## 2022-04-26 LAB — BASIC METABOLIC PANEL
Anion gap: 10 (ref 5–15)
BUN: 12 mg/dL (ref 6–20)
CO2: 20 mmol/L — ABNORMAL LOW (ref 22–32)
Calcium: 9 mg/dL (ref 8.9–10.3)
Chloride: 106 mmol/L (ref 98–111)
Creatinine, Ser: 0.82 mg/dL (ref 0.61–1.24)
GFR, Estimated: >60 mL/min (ref >60)
Glucose, Bld: 101 mg/dL — ABNORMAL HIGH (ref 70–99)
Potassium: 3.6 mmol/L (ref 3.5–5.1)
Sodium: 136 mmol/L (ref 135–145)

## 2022-04-26 NOTE — ED Triage Notes (Signed)
PER EMS: pt reports headache, neck pain, tingling to all his fingers and left leg; onset 8 years ago, hx of neuropathy as well as anxiety.  BP- 193/88, HR-92, O2-97%

## 2022-04-26 NOTE — ED Notes (Signed)
Pt advises he would like to speak to mental health counselor. Denies SI/HI at this time

## 2022-04-26 NOTE — ED Provider Triage Note (Signed)
Emergency Medicine Provider Triage Evaluation Note  [name] [name] , a 61 y.o. male  was evaluated in triage.  Pt complains of headache onset 2 days. Denies vision changes. Denies fever.  Also discusses chronic concerns that he has had including chronic neck pain, chronic neuropathy, chronic color changes to his left lower extremity. Denies recent fall.   Review of Systems  Positive: As per HPI Negative:   Physical Exam  BP (!) 182/123 (BP Location: Left Arm)   Pulse 92   Temp 98.9 F (37.2 C) (Oral)   Resp 18   SpO2 100%  Gen:   Awake, no distress   Resp:  Normal effort  MSK:   Moves extremities without difficulty  Other:    Medical Decision Making  Medically screening exam initiated at 9:50 PM.  Appropriate orders placed.  [name] was informed that the remainder of the evaluation will be completed by another provider, this initial triage assessment does not replace that evaluation, and the importance of remaining in the ED until their evaluation is complete.  Work-up initiated.    Devina Bezold A, PA-C 04/26/22 2152

## 2022-04-27 NOTE — ED Notes (Signed)
Pt reports he is tired of waiting and no longer wishes to be seen. When asked if he still wants to be seen by a psych counselor he stated no. He denies SI/Hi. Pt informed of the risks of leaving AMA and verbalized understanding. Pt ambulatory out of ED with steady gait. A&Ox4.

## 2022-04-30 DIAGNOSIS — R4020 Unspecified coma: Secondary | ICD-10-CM | POA: Diagnosis not present

## 2022-04-30 DIAGNOSIS — Z743 Need for continuous supervision: Secondary | ICD-10-CM | POA: Diagnosis not present

## 2022-04-30 DIAGNOSIS — R61 Generalized hyperhidrosis: Secondary | ICD-10-CM | POA: Diagnosis not present

## 2022-05-07 DIAGNOSIS — Z419 Encounter for procedure for purposes other than remedying health state, unspecified: Secondary | ICD-10-CM | POA: Diagnosis not present

## 2022-06-07 DIAGNOSIS — Z419 Encounter for procedure for purposes other than remedying health state, unspecified: Secondary | ICD-10-CM | POA: Diagnosis not present

## 2022-07-08 DIAGNOSIS — Z419 Encounter for procedure for purposes other than remedying health state, unspecified: Secondary | ICD-10-CM | POA: Diagnosis not present

## 2022-07-19 DIAGNOSIS — E785 Hyperlipidemia, unspecified: Secondary | ICD-10-CM | POA: Diagnosis not present

## 2022-07-19 DIAGNOSIS — Z7689 Persons encountering health services in other specified circumstances: Secondary | ICD-10-CM | POA: Diagnosis not present

## 2022-07-19 DIAGNOSIS — F1129 Opioid dependence with unspecified opioid-induced disorder: Secondary | ICD-10-CM | POA: Diagnosis not present

## 2022-07-19 DIAGNOSIS — I872 Venous insufficiency (chronic) (peripheral): Secondary | ICD-10-CM | POA: Diagnosis not present

## 2022-07-19 DIAGNOSIS — G8929 Other chronic pain: Secondary | ICD-10-CM | POA: Diagnosis not present

## 2022-07-19 DIAGNOSIS — L03114 Cellulitis of left upper limb: Secondary | ICD-10-CM | POA: Diagnosis not present

## 2022-07-19 DIAGNOSIS — G629 Polyneuropathy, unspecified: Secondary | ICD-10-CM | POA: Diagnosis not present

## 2022-08-07 DIAGNOSIS — Z419 Encounter for procedure for purposes other than remedying health state, unspecified: Secondary | ICD-10-CM | POA: Diagnosis not present

## 2022-08-24 DIAGNOSIS — Z7689 Persons encountering health services in other specified circumstances: Secondary | ICD-10-CM | POA: Diagnosis not present

## 2022-09-07 DIAGNOSIS — Z419 Encounter for procedure for purposes other than remedying health state, unspecified: Secondary | ICD-10-CM | POA: Diagnosis not present

## 2022-10-05 ENCOUNTER — Ambulatory Visit (INDEPENDENT_AMBULATORY_CARE_PROVIDER_SITE_OTHER): Payer: Medicaid Other | Admitting: Family Medicine

## 2022-10-05 ENCOUNTER — Telehealth: Payer: Self-pay

## 2022-10-05 ENCOUNTER — Encounter: Payer: Self-pay | Admitting: Family Medicine

## 2022-10-05 VITALS — BP 220/120 | HR 84 | Temp 98.1°F | Ht 71.0 in | Wt 212.0 lb

## 2022-10-05 DIAGNOSIS — M5442 Lumbago with sciatica, left side: Secondary | ICD-10-CM

## 2022-10-05 DIAGNOSIS — Z122 Encounter for screening for malignant neoplasm of respiratory organs: Secondary | ICD-10-CM

## 2022-10-05 DIAGNOSIS — Z716 Tobacco abuse counseling: Secondary | ICD-10-CM

## 2022-10-05 DIAGNOSIS — Z833 Family history of diabetes mellitus: Secondary | ICD-10-CM

## 2022-10-05 DIAGNOSIS — Z125 Encounter for screening for malignant neoplasm of prostate: Secondary | ICD-10-CM

## 2022-10-05 DIAGNOSIS — F1990 Other psychoactive substance use, unspecified, uncomplicated: Secondary | ICD-10-CM | POA: Insufficient documentation

## 2022-10-05 DIAGNOSIS — S81802A Unspecified open wound, left lower leg, initial encounter: Secondary | ICD-10-CM

## 2022-10-05 DIAGNOSIS — R600 Localized edema: Secondary | ICD-10-CM | POA: Diagnosis not present

## 2022-10-05 DIAGNOSIS — F17210 Nicotine dependence, cigarettes, uncomplicated: Secondary | ICD-10-CM | POA: Diagnosis not present

## 2022-10-05 DIAGNOSIS — I10 Essential (primary) hypertension: Secondary | ICD-10-CM | POA: Diagnosis not present

## 2022-10-05 DIAGNOSIS — J449 Chronic obstructive pulmonary disease, unspecified: Secondary | ICD-10-CM

## 2022-10-05 DIAGNOSIS — Z1211 Encounter for screening for malignant neoplasm of colon: Secondary | ICD-10-CM

## 2022-10-05 DIAGNOSIS — Z0000 Encounter for general adult medical examination without abnormal findings: Secondary | ICD-10-CM | POA: Diagnosis not present

## 2022-10-05 DIAGNOSIS — G8929 Other chronic pain: Secondary | ICD-10-CM

## 2022-10-05 DIAGNOSIS — M5441 Lumbago with sciatica, right side: Secondary | ICD-10-CM | POA: Diagnosis not present

## 2022-10-05 DIAGNOSIS — E785 Hyperlipidemia, unspecified: Secondary | ICD-10-CM | POA: Diagnosis not present

## 2022-10-05 DIAGNOSIS — Z7689 Persons encountering health services in other specified circumstances: Secondary | ICD-10-CM | POA: Diagnosis not present

## 2022-10-05 HISTORY — DX: Other psychoactive substance use, unspecified, uncomplicated: F19.90

## 2022-10-05 NOTE — Patient Instructions (Addendum)
Resume home medications, continue holding Xarelto until blood pressure under control. Runner, broadcasting/film/video for Suboxone management. Proceed to ED for chest pain, palpitations, shortness of breath, confusion, difficulty speaking or walking, or other signs of stroke or heart attack. Check blood pressure at home and bring readings to visit in 3 days at [facility] boot removal.

## 2022-10-05 NOTE — Assessment & Plan Note (Signed)
Reports using Heroin 3 times weekly for pain control. Would like Suboxone and help quitting. I have referred  him to [facility] for pain management.

## 2022-10-05 NOTE — Progress Notes (Signed)
New Patient Office Visit  Subjective    Patient ID: [name] [name], male    DOB: 01-17-1961  Age: 61 y.o. MRN: [id number]  CC:  Chief Complaint  Patient presents with   Establish Care    HPI [name] [name] presents to establish care. Oriented to practice routines and expectations. Previously seeing [name] at [facility] He is not currently taking his medications for the past 4 months due to "drug interactions that have caused him nerve damage", he is very concerned about interactions of his drugs causing debilitating side effects, reports discussing this with his previous provider without resolution. Denies chest pain, shortness of breath, palpitations, headaches, vision changes. Endorses difficulty starting urine stream with history of BPH not taking his Proscar.  He complains of left leg swelling, redness, pain, and several scattered wounds. He was previously seeing wound management but it has been some time.  [name] also complains of chronic back pain with sciatic nerve pain. He has a history of cervical spinal surgery and has been worked up for this back pain some time ago and lumbar surgery was recommended. He would like a referral back to neurosurgery and repeat imaging.  [name] also requests Suboxone to help with his heroin addiction. I discussed with him the importance of quitting, however this is not a service I provide. I recommended he reach out to [facility]. He uses Heroin for pain management.  He is a 1 ppd smoker since age 53  PSA done today   Outpatient Encounter Medications as of 10/05/2022  Medication Sig   albuterol (PROVENTIL HFA;VENTOLIN HFA) 108 (90 BASE) MCG/ACT inhaler Inhale 2 puffs into the lungs every 4 (four) hours as needed for wheezing. Reported on 04/11/2016   amLODipine (NORVASC) 10 MG tablet Take 1 tablet (10 mg total) by mouth daily.   atorvastatin (LIPITOR) 80 MG tablet TAKE 1 TABLET (80 MG TOTAL) BY MOUTH DAILY AT 6 PM. (Patient taking  differently: Take 80 mg by mouth daily.)   Azelastine HCl 137 MCG/SPRAY SOLN Place 1 spray into both nostrils 2 (two) times daily.   finasteride (PROSCAR) 5 MG tablet Take 1 tablet (5 mg total) by mouth daily.   furosemide (LASIX) 20 MG tablet Take 20 mg by mouth in the morning and at bedtime.   lisinopril (PRINIVIL,ZESTRIL) 40 MG tablet Take 1 tablet (40 mg total) by mouth daily.   pregabalin (LYRICA) 300 MG capsule Take 1 capsule (300 mg total) by mouth 2 (two) times daily. (Patient taking differently: Take 150 mg by mouth in the morning, at noon, in the evening, and at bedtime.)   propranolol (INDERAL) 20 MG tablet Take 1 tablet (20 mg total) by mouth 3 (three) times daily.   rivaroxaban (XARELTO) 20 MG TABS tablet TAKE 1 TABLET BY MOUTH EVERY [place] WITH SUPPER (Patient taking differently: Take 20 mg by mouth daily with supper.)   SUBOXONE 8-2 MG FILM Place 1 strip under the tongue 3 (three) times daily.   sulfamethoxazole-trimethoprim (BACTRIM) 400-80 MG tablet Take 1 tablet by mouth 2 (two) times daily.   No facility-administered encounter medications on file as of 10/05/2022.    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   onset 02/14/14   Opioid abuse (HCC)    PE (pulmonary embolism)    DVT left leg   Status post spinal surgery    Stroke [facility])    Tobacco abuse     Past Surgical History:  Procedure Laterality Date   SPINAL FIXATION SURGERY     cervical    Family History  Problem Relation Age of Onset   Cancer Mother    Depression Sister     Social History   Socioeconomic History   Marital status: Divorced    Spouse  name: Not on file   Number of children: Not on file   Years of education: Not on file   Highest education level: Not on file  Occupational History   Not on file  Tobacco Use   Smoking status: Every [place]    Packs/Shadix: 1.50    Types: Cigarettes   Smokeless tobacco: Never   Tobacco comments:    increased to 2ppd  Vaping Use   Vaping Use: Never used  Substance and Sexual Activity   Alcohol use: No    Alcohol/week: 0.0 standard drinks of alcohol    Comment: former   Drug use: Yes    Comment: Heroin-just used 3 days ago   Sexual activity: Yes  Other Topics Concern   Not on file  Social History Narrative   Not on file   Social Determinants of Health   Financial Resource Strain: Not on file  Food Insecurity: Not on file  Transportation Needs: Not on file  Physical Activity: Not on file  Stress: Not on file  Social Connections: Not on file  Intimate Partner Violence: Not on file    Review of Systems  Reason unable to perform ROS: see HPI.  All other systems reviewed and are negative.       Objective    BP (!) 220/120   Pulse 84   Temp 98.1 F (36.7 C) (Oral)   Ht '5\' 11"'$  (1.803 m)   Wt 212 lb (96.2 kg)   SpO2 100%   BMI 29.57 kg/m   Physical Exam Vitals and nursing note reviewed.  Constitutional:      General: He is not in acute distress.    Appearance: Normal appearance. He is normal weight. He is not ill-appearing or diaphoretic.  HENT:     Head: Normocephalic and atraumatic.     Right Ear: Tympanic membrane, ear canal and external ear normal.     Left Ear: Tympanic membrane, ear canal and external ear normal.     Nose: Nose normal.     Mouth/Throat:     Mouth: Mucous membranes are moist.     Pharynx: Oropharynx is clear.  Eyes:     Extraocular Movements: Extraocular movements intact.     Right eye: Normal extraocular motion and no nystagmus.     Left eye: Normal extraocular motion and no nystagmus.     Conjunctiva/sclera: Conjunctivae normal.      Pupils: Pupils are equal, round, and reactive to light.  Cardiovascular:     Rate and Rhythm: Normal rate and regular rhythm.     Pulses: Normal pulses.     Heart sounds: Normal heart sounds.  Pulmonary:     Effort: Pulmonary effort is normal.     Breath sounds: Normal breath sounds.  Abdominal:  General: Bowel sounds are normal.     Palpations: Abdomen is soft.  Musculoskeletal:        General: Normal range of motion.     Cervical back: Normal range of motion and neck supple.     Left lower leg: Edema present.  Skin:    General: Skin is warm and dry.     Capillary Refill: Capillary refill takes less than 2 seconds.     Findings: Erythema and wound present.     Comments: Several wounds near his left ankle, approx 1in x 1in in size, round, yellow slough to wound bed, surrounding erythema and edema  Neurological:     General: No focal deficit present.     Mental Status: He is alert and oriented to person, place, and time.  Psychiatric:        Mood and Affect: Mood normal.        Speech: Speech normal.        Behavior: Behavior normal.        Thought Content: Thought content normal.        Cognition and Memory: Cognition and memory normal.        Judgment: Judgment normal.        Assessment & Plan:   Problem List Items Addressed This Visit       Cardiovascular and Mediastinum   HTN (hypertension) - Primary (Chronic)    Patient's BP elevated at 220/110 today. He is without symptoms. Has not been taking his prescribed medications that include Amlodipine '10mg'$  daily, Lisinopril '40mg'$  daily, Inderal 2 mg TID, Lipitor '80mg'$  daily, and Xarelto '20mg'$  daily. I instructed him to resume his home medications but continue holding the Xarelto until we get his BP under control. He will return to office in 3 days. Seek emergent care for chest pain, dyspnea, palpitations, shortness of breath.      Relevant Orders   EKG 12-Lead (Completed)   CBC with Differential/Platelet   COMPLETE  METABOLIC PANEL WITH GFR   Lipid panel     Respiratory   COPD (chronic obstructive pulmonary disease) (HCC) (Chronic)     Other   Low back pain (Chronic)   Relevant Orders   Ambulatory referral to Pain Clinic   Ambulatory referral to Neurosurgery   DG Lumbar Spine Complete   Hyperlipidemia   Relevant Orders   Lipid panel   Edema of left lower leg    Patient has history of vascular disease with edema and wounds, he is noncompliant with TEDs and is no longer seeing wound care. Today I cleansed his wounds and placed an Haematologist. He will return to office in 3 days for reevaluation. He is prescribed Lasix but is non-compliant with medications. He denies shortness of breath but has been off of his Xarelto for 4 months. Given his history of DVT and PE and worsening pain, erythema, and edema, will obtain dopplers to rule out DVT.      Relevant Orders   VAS [place] LOWER EXTREMITY VENOUS (DVT)   Nicotine dependence, cigarettes, uncomplicated    He is a 1 ppd smoker since age 74 discussed importance of quitting, he has cut back from 3 ppd. Has tried patches and gum. Declines medical management at this time.      Wound of left leg   Relevant Orders   Ambulatory referral to Wound Clinic   Substance use disorder    Reports using Heroin 3 times weekly for pain control. Would like Suboxone and help quitting.  I have referred  him to [facility] for pain management.      Other Visit Diagnoses     Physical exam, annual       Relevant Orders   CBC with Differential/Platelet   COMPLETE METABOLIC PANEL WITH GFR   Lipid panel   Hemoglobin A1c   Family history of diabetes mellitus       Relevant Orders   Hemoglobin A1c   Screening for colon cancer       Relevant Orders   Ambulatory referral to Gastroenterology   Screening for lung cancer       Relevant Orders   CT CHEST LUNG CANCER SCREENING LOW DOSE WO CONTRAST   Prostate cancer screening       Relevant Orders   PSA   Tobacco abuse  counseling           Return in about 3 days (around 10/08/2022) for BP check and unna boot removal.   Rubie Maid, FNP

## 2022-10-05 NOTE — Assessment & Plan Note (Signed)
He is a 1 ppd smoker since age 61 discussed importance of quitting, he has cut back from 3 ppd. Has tried patches and gum. Declines medical management at this time.

## 2022-10-05 NOTE — Telephone Encounter (Signed)
[name] [name] w/[facility]  Called to varify if provider wanted DVT order to be STAT? Also wanted to know if it would be fine if unable to do procedure tom due to [facility] Transportation, if could wait to it on Friday?   Spoke w/[name] per provider, stated that is fine and it's fine to do it on Friday as well.   Per [name] [name], will let us know if it's positive for DVT. Will send to pt to [facility] DVT clinic.

## 2022-10-05 NOTE — Assessment & Plan Note (Signed)
Patient's BP elevated at 220/110 today. He is without symptoms. Has not been taking his prescribed medications that include Amlodipine '10mg'$  daily, Lisinopril '40mg'$  daily, Inderal 2 mg TID, Lipitor '80mg'$  daily, and Xarelto '20mg'$  daily. I instructed him to resume his home medications but continue holding the Xarelto until we get his BP under control. He will return to office in 3 days. Seek emergent care for chest pain, dyspnea, palpitations, shortness of breath.

## 2022-10-05 NOTE — Assessment & Plan Note (Signed)
Patient has history of vascular disease with edema and wounds, he is noncompliant with TEDs and is no longer seeing wound care. Today I cleansed his wounds and placed an Haematologist. He will return to office in 3 days for reevaluation. He is prescribed Lasix but is non-compliant with medications. He denies shortness of breath but has been off of his Xarelto for 4 months. Given his history of DVT and PE and worsening pain, erythema, and edema, will obtain dopplers to rule out DVT.

## 2022-10-06 ENCOUNTER — Encounter: Payer: Self-pay | Admitting: Family Medicine

## 2022-10-06 ENCOUNTER — Encounter (INDEPENDENT_AMBULATORY_CARE_PROVIDER_SITE_OTHER): Payer: Self-pay

## 2022-10-06 ENCOUNTER — Encounter (INDEPENDENT_AMBULATORY_CARE_PROVIDER_SITE_OTHER): Payer: Self-pay | Admitting: *Deleted

## 2022-10-06 LAB — CBC WITH DIFFERENTIAL/PLATELET
Absolute Monocytes: 450 cells/uL (ref 200–950)
Basophils Absolute: 47 cells/uL (ref 0–200)
Basophils Relative: 0.6 %
Eosinophils Absolute: 182 cells/uL (ref 15–500)
Eosinophils Relative: 2.3 %
HCT: 37.8 % — ABNORMAL LOW (ref 38.5–50.0)
Hemoglobin: 12.8 g/dL — ABNORMAL LOW (ref 13.2–17.1)
Lymphs Abs: 2560 cells/uL (ref 850–3900)
MCH: 31.6 pg (ref 27.0–33.0)
MCHC: 33.9 g/dL (ref 32.0–36.0)
MCV: 93.3 fL (ref 80.0–100.0)
MPV: 9.1 fL (ref 7.5–12.5)
Monocytes Relative: 5.7 %
Neutro Abs: 4661 cells/uL (ref 1500–7800)
Neutrophils Relative %: 59 %
Platelets: 219 10*3/uL (ref 140–400)
RBC: 4.05 10*6/uL — ABNORMAL LOW (ref 4.20–5.80)
RDW: 13.1 % (ref 11.0–15.0)
Total Lymphocyte: 32.4 %
WBC: 7.9 10*3/uL (ref 3.8–10.8)

## 2022-10-06 LAB — HEMOGLOBIN A1C
Hgb A1c MFr Bld: 5.4 % of total Hgb (ref <5.7)
Mean Plasma Glucose: 108 mg/dL
eAG (mmol/L): 6 mmol/L

## 2022-10-06 LAB — PSA: PSA: 0.38 ng/mL (ref <=4.00)

## 2022-10-06 LAB — COMPLETE METABOLIC PANEL WITH GFR
AG Ratio: 1.5 (calc) (ref 1.0–2.5)
ALT: 7 U/L — ABNORMAL LOW (ref 9–46)
AST: 10 U/L (ref 10–35)
Albumin: 3.8 g/dL (ref 3.6–5.1)
Alkaline phosphatase (APISO): 92 U/L (ref 35–144)
BUN: 25 mg/dL (ref 7–25)
CO2: 23 mmol/L (ref 20–32)
Calcium: 8.7 mg/dL (ref 8.6–10.3)
Chloride: 102 mmol/L (ref 98–110)
Creat: 1.09 mg/dL (ref 0.70–1.35)
Globulin: 2.5 g/dL (calc) (ref 1.9–3.7)
Glucose, Bld: 85 mg/dL (ref 65–99)
Potassium: 4.8 mmol/L (ref 3.5–5.3)
Sodium: 136 mmol/L (ref 135–146)
Total Bilirubin: 0.4 mg/dL (ref 0.2–1.2)
Total Protein: 6.3 g/dL (ref 6.1–8.1)
eGFR: 78 mL/min/{1.73_m2} (ref >=60)

## 2022-10-06 LAB — LIPID PANEL
Cholesterol: 139 mg/dL (ref <200)
HDL: 46 mg/dL (ref >=40)
LDL Cholesterol (Calc): 76 mg/dL (calc)
Non-HDL Cholesterol (Calc): 93 mg/dL (calc) (ref <130)
Total CHOL/HDL Ratio: 3 (calc) (ref <5.0)
Triglycerides: 87 mg/dL (ref <150)

## 2022-10-07 ENCOUNTER — Telehealth: Payer: Self-pay

## 2022-10-07 DIAGNOSIS — Z419 Encounter for procedure for purposes other than remedying health state, unspecified: Secondary | ICD-10-CM | POA: Diagnosis not present

## 2022-10-07 NOTE — Telephone Encounter (Signed)
Try call pt to schedule his DVT appt at the vascular clinic, no answer. LVM for pt to call Monday and to call Vascular clinic to make his appt.

## 2022-10-10 ENCOUNTER — Encounter: Payer: Self-pay | Admitting: Family Medicine

## 2022-10-10 ENCOUNTER — Ambulatory Visit (INDEPENDENT_AMBULATORY_CARE_PROVIDER_SITE_OTHER): Payer: Medicaid Other | Admitting: Family Medicine

## 2022-10-10 VITALS — BP 170/100 | HR 65 | Temp 98.2°F | Ht 71.0 in | Wt 222.0 lb

## 2022-10-10 DIAGNOSIS — I10 Essential (primary) hypertension: Secondary | ICD-10-CM

## 2022-10-10 DIAGNOSIS — R600 Localized edema: Secondary | ICD-10-CM

## 2022-10-10 DIAGNOSIS — Z748 Other problems related to care provider dependency: Secondary | ICD-10-CM | POA: Diagnosis not present

## 2022-10-10 DIAGNOSIS — F1990 Other psychoactive substance use, unspecified, uncomplicated: Secondary | ICD-10-CM

## 2022-10-10 DIAGNOSIS — R195 Other fecal abnormalities: Secondary | ICD-10-CM

## 2022-10-10 DIAGNOSIS — Z7689 Persons encountering health services in other specified circumstances: Secondary | ICD-10-CM | POA: Diagnosis not present

## 2022-10-10 NOTE — Assessment & Plan Note (Addendum)
Left lower extremity remains edematous, vascular ultrasound scheduled for 8am tomorrow to rule out DVT. Denies chest pain, palpitations, shortness of breath, confusion, or AMS. He reports compliance with TEDs and does have an appointment with wound care. Will resume his Eliquis at this time.

## 2022-10-10 NOTE — Progress Notes (Signed)
Acute Office Visit  Subjective:     Patient ID: [name] [name], male    DOB: 18-Dec-1960, 61 y.o.   MRN: [id number]  Chief Complaint  Patient presents with   Follow-up    Return in about 3 days (around 10/08/2022) for unna boot.     HPI Patient is in today for follow-up for elevated BP and unna boot removal. He removed his unna boot Saturday night because it was burning and washed his skin and placed compression stocking and has been wearing then since with some improvement in the edema. He does have an appointment with wound care as well. He has resumed his home medications as prescribed and BP is mildly improved today, however he is unable to confirm his home medication regimen with certainty.  [name] [name] is also concerned about the blood in his stool and positive cologuard so will verify colonoscopy and GI referral.    Review of Systems  Respiratory: Negative.    Cardiovascular:  Positive for leg swelling.  Gastrointestinal:  Positive for blood in stool.  Genitourinary: Negative.   Neurological: Negative.  Negative for dizziness and headaches.  All other systems reviewed and are negative.   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 Reported on 04/11/2016     amLODipine (NORVASC) 10 MG tablet Take 1 tablet (10 mg total) by mouth daily. 90 tablet 3   atorvastatin (LIPITOR) 80 MG tablet TAKE 1 TABLET (80 MG TOTAL) BY MOUTH DAILY AT 6 PM. (Patient taking differently: Take 80 mg by mouth daily.) 90 tablet 1   Azelastine HCl 137 MCG/SPRAY SOLN Place 1 spray into both nostrils 2 (two) times daily.     finasteride (PROSCAR) 5 MG tablet Take 1 tablet (5 mg total) by mouth daily. 30 tablet 5   furosemide (LASIX) 20 MG tablet Take 20 mg by mouth in the morning and at bedtime.     lisinopril (PRINIVIL,ZESTRIL) 40 MG tablet Take 1 tablet (40 mg total) by mouth daily. 90 tablet 3   pregabalin (LYRICA) 300 MG capsule Take 1 capsule (300 mg total) by mouth 2 (two) times daily. (Patient taking differently: Take 150 mg by mouth in the morning, at noon, in the evening, and at bedtime.) 60 capsule 1   propranolol (INDERAL) 20 MG tablet Take 1 tablet (20 mg total) by mouth 3 (three) times daily. 90 tablet 1   rivaroxaban (XARELTO) 20 MG TABS tablet TAKE 1 TABLET BY MOUTH EVERY [place] WITH SUPPER (  Patient taking differently: Take 20 mg by mouth daily with supper.) 30 tablet 2   SUBOXONE 8-2 MG FILM Place 1 strip under the tongue 3 (three) times daily.     sulfamethoxazole-trimethoprim (BACTRIM) 400-80 MG tablet Take 1 tablet by mouth 2 (two) times daily. 28 tablet 0   No current facility-administered medications on file prior to visit.   Allergies  Allergen Reactions   Amlodipine Swelling    BLE peripheral edema    Fentanyl     Per pt he had a near overdose on these due to "regulating body temp"   Methadone Itching       Objective:    BP (!) 170/100   Pulse 65   Temp 98.2 F (36.8 C) (Oral)   Ht '5\' 11"'$  (1.803 m)   Wt 222 lb (100.7 kg)   SpO2 99%   BMI 30.96 kg/m  BP Readings from Last 3 Encounters:  10/10/22 (!) 170/100  10/05/22 (!) 220/120  04/26/22 (!)  182/123      Physical Exam Vitals and nursing note reviewed.  Constitutional:      Appearance: Normal appearance. He is normal weight.  HENT:     Head: Normocephalic and atraumatic.  Cardiovascular:     Rate and Rhythm: Normal rate and regular rhythm.     Pulses: Normal pulses.     Heart sounds: Normal heart sounds.  Pulmonary:     Effort: Pulmonary effort is normal.     Breath sounds: Normal breath sounds.  Skin:    General: Skin is warm and dry.     Capillary Refill: Capillary refill takes less than 2 seconds.  Neurological:     General: No focal deficit present.     Mental Status: He is alert and oriented to person, place, and time. Mental status is at baseline.  Psychiatric:        Mood and Affect: Mood normal.        Behavior: Behavior normal.        Thought Content: Thought content normal.        Judgment: Judgment normal.     No results found for any visits on 10/10/22.      Assessment & Plan:   Problem List Items Addressed This Visit       Cardiovascular and Mediastinum   HTN (hypertension) - Primary (Chronic)    Patient BP remains elevated today. He reports he has resumed his home meds since prior visit but is unsure exactly what medications and dosage. I encouraged him to bring medications with him to visits. He will report his current medications and dosages via MyChart. Continue heart healthy diet. Discussed importance of quitting heroine and tobacco cessation. Instructed to resume Eliquis.        Other   Edema of left lower leg    Left lower extremity remains edematous, vascular ultrasound scheduled for 8am tomorrow to rule out DVT. Denies chest pain, palpitations, shortness of breath, confusion, or AMS. He reports compliance with TEDs and does have an appointment with wound care. Will resume his Eliquis at this time.      Positive colorectal cancer screening using Cologuard test   Relevant Orders   Ambulatory referral to Gastroenterology   Substance  use disorder   Other Visit Diagnoses     Assistance needed with transportation       Relevant Orders   Ambulatory referral to Social Work       No orders of the defined types were placed  in this encounter.   Return in about 4 weeks (around 11/07/2022) for BP follow-up.  Rubie Maid, FNP

## 2022-10-10 NOTE — Assessment & Plan Note (Addendum)
Patient BP remains elevated today. He reports he has resumed his home meds since prior visit but is unsure exactly what medications and dosage. I encouraged him to bring medications with him to visits. He will report his current medications and dosages via MyChart. Continue heart healthy diet. Discussed importance of quitting heroine and tobacco cessation. Instructed to resume Eliquis.

## 2022-10-11 ENCOUNTER — Other Ambulatory Visit: Payer: Self-pay | Admitting: Family Medicine

## 2022-10-11 ENCOUNTER — Encounter: Payer: Self-pay | Admitting: Family Medicine

## 2022-10-11 ENCOUNTER — Telehealth: Payer: Self-pay

## 2022-10-11 ENCOUNTER — Encounter (HOSPITAL_COMMUNITY): Payer: Medicaid Other

## 2022-10-11 DIAGNOSIS — I10 Essential (primary) hypertension: Secondary | ICD-10-CM

## 2022-10-11 MED ORDER — HYDROCHLOROTHIAZIDE 12.5 MG PO TABS: 12.5000 mg | ORAL_TABLET | Freq: Every day | ORAL | 3 refills | Status: DC

## 2022-10-11 NOTE — Telephone Encounter (Signed)
Pt called to advised that he is taking Hydralazine once per [place]. Thank you.

## 2022-10-12 ENCOUNTER — Encounter (INDEPENDENT_AMBULATORY_CARE_PROVIDER_SITE_OTHER): Payer: Self-pay | Admitting: *Deleted

## 2022-10-14 ENCOUNTER — Ambulatory Visit (HOSPITAL_COMMUNITY)
Admission: RE | Admit: 2022-10-14 | Discharge: 2022-10-14 | Disposition: A | Discharge status: Home / Self Care | Payer: Medicaid Other | Source: Ambulatory Visit | Attending: Family Medicine | Admitting: Family Medicine

## 2022-10-14 DIAGNOSIS — R600 Localized edema: Secondary | ICD-10-CM | POA: Diagnosis not present

## 2022-10-14 DIAGNOSIS — Z7689 Persons encountering health services in other specified circumstances: Secondary | ICD-10-CM | POA: Diagnosis not present

## 2022-10-17 ENCOUNTER — Ambulatory Visit (HOSPITAL_COMMUNITY): Payer: Medicaid Other | Attending: Family Medicine | Admitting: Physical Therapy

## 2022-10-17 ENCOUNTER — Encounter (HOSPITAL_COMMUNITY): Payer: Self-pay | Admitting: Physical Therapy

## 2022-10-17 DIAGNOSIS — S81802A Unspecified open wound, left lower leg, initial encounter: Secondary | ICD-10-CM | POA: Insufficient documentation

## 2022-10-17 DIAGNOSIS — Z7689 Persons encountering health services in other specified circumstances: Secondary | ICD-10-CM | POA: Diagnosis not present

## 2022-10-17 DIAGNOSIS — R2689 Other abnormalities of gait and mobility: Secondary | ICD-10-CM | POA: Diagnosis not present

## 2022-10-17 NOTE — Therapy (Signed)
OUTPATIENT PHYSICAL THERAPY WOUND EVALUATION   Patient Name: [name] [name] MRN: [id number] DOB:Apr 03, 1961, 61 y.o., male Today's Date: 10/17/2022   PCP: Rubie Maid, FNP REFERRING PROVIDER: Rubie Maid, FNP  END OF SESSION:  PT End of Session - 10/17/22 1309     Visit Number 1    Number of Visits 12    Date for PT Re-Evaluation 11/28/22    Authorization Type Medicaid [employer]    Authorization Time Period 12 visits requested - check auth    PT Start Time 1308   arrives late   PT Stop Time 1335    PT Time Calculation (min) 27 min    Activity Tolerance Patient tolerated treatment well    Behavior During Therapy [facility] for tasks assessed/performed             Past Medical History:  Diagnosis Date   Allergy    Anxiety    Asthma    Cancer ([place])    CHF (congestive heart failure) ([place])    Constipation due to pain medication    COPD (chronic obstructive pulmonary disease) ([place])    CVA (cerebral infarction) 09/18/2015   Depression    Emphysema of lung ([place])    Hyperlipidemia    Hypertension    Migraine    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   onset 02/14/14   Opioid abuse ([place])    PE (pulmonary embolism)    DVT left leg   Status post spinal surgery    Stroke [facility])    Tobacco abuse    Past Surgical History:  Procedure Laterality Date   SPINAL FIXATION SURGERY     cervical   Patient Active Problem List   Diagnosis Date Noted   Wound of left leg 10/05/2022   Substance use disorder 10/05/2022   Abnormal EKG 07/01/2021   Atypical chest pain 07/01/2021   DOE (dyspnea on exertion) 07/01/2021   Edema 07/01/2021   Sinus bradycardia 07/01/2021   Positive colorectal cancer screening using Cologuard test 04/19/2019   Chronic anticoagulation 07/23/2018   Generalized anxiety disorder 07/19/2016   History of pulmonary embolism 07/19/2016   Nicotine dependence, cigarettes, uncomplicated 76/16/0737   Headache 01/25/2016   Tobacco use disorder    Fall     Aphasia due to recent stroke    Stroke ([place]) 09/18/2015   Right sided weakness 09/18/2015   Right knee pain 07/21/2015   Wound of skin 06/03/2015   Chronic pain syndrome 05/28/2014   Anxiety state 05/28/2014   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   Depression 03/24/2014   Low back pain 02/16/2014   Left foot drop 02/14/2014   Edema of left lower leg 06/18/2012   HTN (hypertension) 11/15/2011   Hyperlipidemia 11/15/2011   COPD (chronic obstructive pulmonary disease) ([place]) 11/15/2011   Tobacco abuse 11/15/2011    ONSET DATE: over 1 year  REFERRING DIAG: S81.802A (ICD-10-CM) - Wound of left lower extremity, initial encounter  THERAPY DIAG:  Wound of left lower extremity, initial encounter  Other abnormalities of gait and mobility  Rationale for Evaluation and Treatment: Rehabilitation     Wound Therapy - 10/17/22 0001     Subjective Patient states he has had wounds for over a year. Was going to wound clinic in [place] and after discharge he was wearing compression but they came back. Wounds  have been heeling slowly. He has compression garments on and has more at home.    Patient and Family Stated Goals wounds to heal  Date of Onset 10/17/21    Prior Treatments wound care, compression    Pain Scale 0-10    Pain Score 10-Worst pain ever    Pain Type Chronic pain    Pain Location Leg    Pain Orientation Left    Evaluation and Treatment Procedures Explained to Patient/Family Yes    Evaluation and Treatment Procedures agreed to    Wound Properties Date First Assessed: 10/17/22 Time First Assessed: 1310 Wound Type: Venous stasis ulcer Location: Pretibial Location Orientation: Left Wound Description (Comments): several ulcers on anterior and posterior LLE of various sizes Present on Admission: Yes   Wound Image Images linked: 2    Dressing Type None    Dressing Changed Changed    Dressing Change Frequency PRN    Site / Wound Assessment Red;Yellow    Peri-wound Assessment  Erythema (blanchable);Edema;Hemosiderin    Drainage Amount Minimal    Drainage Description Serous    Treatment Cleansed    Wound Therapy - Clinical Statement Patient presents with chronic venous stasis ulcers on LLE. Patient has been using compression garments and wounds have been healing. Wounds continue to be scabbed over with minimal drainage today. Very dry and flacky LLE with significant edema. Cleansed LLE and applied vaseline to protect intact skin. Dressed wounds with xeroform followed by 4x4s and profore lite. Patient will benefit from PT to promote wound healing.    Wound Therapy - Functional Problem List walking, sleeping, bathing    Factors Delaying/Impairing Wound Healing Diabetes Mellitus;Immobility;Multiple medical problems;Substance abuse;Vascular compromise    Hydrotherapy Plan Debridement;Dressing change;Electrical stimulation;Patient/family education;Pulsatile lavage with suction;Ultrasonic wound therapy '@35'$  KHz (+/- 3 KHz)    Wound Therapy - Frequency 2X / week    Wound Therapy - Current Recommendations PT    Wound Plan debride and dressing changes PRN    Dressing  vaseline, xeroform, 4x4s, profore lite, netting               PATIENT EDUCATION: Education details: Patient educated on exam findings, POC, scope of PT, keeping dressing intact unless soiled Person educated: Patient Education method: Consulting civil engineer, Demonstration Education comprehension: verbalized understanding, returned demonstration, verbal cues required, and tactile cues required   HOME EXERCISE PROGRAM: N/a   GOALS: Goals reviewed with patient? Yes  SHORT TERM GOALS: Target date: 11/07/2022    Patient will not have weeping from LLE in order to demonstrate reducing edema.  Baseline: Goal status: INITIAL    LONG TERM GOALS: Target date: 11/28/2022    Patients wounds to be healed to reduce risk of infection Baseline:  Goal status: INITIAL  2.  Patient will be able to don new compression  garments to reduce risk of wound devolvement.  Baseline:  Goal status: INITIAL    ASSESSMENT:  CLINICAL IMPRESSION: See above  OBJECTIVE IMPAIRMENTS: Abnormal gait, decreased endurance, decreased mobility, difficulty walking, increased edema, impaired flexibility, impaired sensation, improper body mechanics, and impaired skin integrity .   ACTIVITY LIMITATIONS: standing, squatting, stairs, transfers, bathing, locomotion level, and caring for others  PARTICIPATION LIMITATIONS: meal prep, cleaning, laundry, shopping, community activity, and yard work  PERSONAL FACTORS: Fitness, Past/current experiences, Social background, and 3+ comorbidities: chronic LBP, HTN, substance use, CHF, COPD, HLD, hx CVA, neuropathy, anxiety/depression  are also affecting patient's functional outcome.   REHAB POTENTIAL: Good  CLINICAL DECISION MAKING: Evolving/moderate complexity  EVALUATION COMPLEXITY: Moderate  PLAN: PT FREQUENCY: 2x/week  PT DURATION: 6 weeks  PLANNED INTERVENTIONS: Therapeutic exercises, Therapeutic activity, Neuromuscular re-education, Balance training, Gait training, Patient/Family education,  Joint manipulation, Joint mobilization, Stair training, Orthotic/Fit training, DME instructions, Aquatic Therapy, Dry Needling, Electrical stimulation, Spinal manipulation, Spinal mobilization, Cryotherapy, Moist heat, Compression bandaging, scar mobilization, Splintting, Taping, Traction, Ultrasound, Ionotophoresis '4mg'$ /ml Dexamethasone, and Manual therapy  PLAN FOR NEXT SESSION: debride and dressing changes PRN   Mearl Latin, PT 10/17/2022, 1:45 PM

## 2022-10-20 ENCOUNTER — Ambulatory Visit (HOSPITAL_COMMUNITY): Payer: Medicaid Other

## 2022-10-20 ENCOUNTER — Telehealth (HOSPITAL_COMMUNITY): Payer: Self-pay

## 2022-10-20 NOTE — Telephone Encounter (Signed)
No show, called and left message concerning missed apt today. Included next apt date and time with contact information if needs to cancel/reschedule future apts. Included location as well in voice mail as we will be back in outpatient dept building on [address], LPTA/CLT; Delana Meyer [phone]

## 2022-10-26 ENCOUNTER — Telehealth (HOSPITAL_COMMUNITY): Payer: Self-pay | Admitting: Physical Therapy

## 2022-10-26 ENCOUNTER — Ambulatory Visit (HOSPITAL_COMMUNITY): Payer: Medicaid Other | Admitting: Physical Therapy

## 2022-10-26 DIAGNOSIS — Z7689 Persons encountering health services in other specified circumstances: Secondary | ICD-10-CM | POA: Diagnosis not present

## 2022-10-26 NOTE — Telephone Encounter (Signed)
Pt did not show for appt. (NS #2) States he went to the hospital as he was not informed that it was moved back to the outpatient location.  Tried to get patient in later to today, tomorrow or Friday but he deferred stating he would wait until next week.  Pt given clinic address and phone number.  Teena Irani, PTA/CLT [place] Ph: [phone]

## 2022-11-01 ENCOUNTER — Other Ambulatory Visit: Payer: Self-pay | Admitting: Family Medicine

## 2022-11-01 ENCOUNTER — Telehealth: Payer: Self-pay

## 2022-11-01 ENCOUNTER — Ambulatory Visit (HOSPITAL_COMMUNITY): Payer: Medicaid Other

## 2022-11-01 ENCOUNTER — Encounter (HOSPITAL_COMMUNITY): Payer: Self-pay

## 2022-11-01 DIAGNOSIS — S81802A Unspecified open wound, left lower leg, initial encounter: Secondary | ICD-10-CM | POA: Diagnosis not present

## 2022-11-01 DIAGNOSIS — R2689 Other abnormalities of gait and mobility: Secondary | ICD-10-CM

## 2022-11-01 DIAGNOSIS — Z7689 Persons encountering health services in other specified circumstances: Secondary | ICD-10-CM | POA: Diagnosis not present

## 2022-11-01 MED ORDER — CEPHALEXIN 500 MG PO CAPS: 500.0000 mg | ORAL_CAPSULE | Freq: Four times a day (QID) | ORAL | 0 refills | Status: DC

## 2022-11-01 NOTE — Therapy (Signed)
OUTPATIENT PHYSICAL THERAPY WOUND TREATMENT   Patient Name: [name] [name] MRN: [id number] DOB:1961/02/13, 61 y.o., male Today's Date: 11/01/2022   PCP: Rubie Maid, FNP REFERRING PROVIDER: Rubie Maid, FNP  END OF SESSION:  PT End of Session - 11/01/22 1026     Visit Number 2    Number of Visits 12    Date for PT Re-Evaluation 11/28/22    Authorization Type Medicaid [place] Time Period [employer] approved 8 visits from 12/11-->12/16/22    Authorization - Visit Number 1    Authorization - Number of Visits 2    Progress Note Due on Visit 8    PT Start Time 0930    PT Stop Time 1000    PT Time Calculation (min) 30 min    Activity Tolerance Patient tolerated treatment well    Behavior During Therapy [facility] for tasks assessed/performed             Past Medical History:  Diagnosis Date   Allergy    Anxiety    Asthma    Cancer ([place])    CHF (congestive heart failure) ([place])    Constipation due to pain medication    COPD (chronic obstructive pulmonary disease) ([place])    CVA (cerebral infarction) 09/18/2015   Depression    Emphysema of lung ([place])    Hyperlipidemia    Hypertension    Migraine    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   onset 02/14/14   Opioid abuse ([place])    PE (pulmonary embolism)    DVT left leg   Status post spinal surgery    Stroke [facility])    Tobacco abuse    Past Surgical History:  Procedure Laterality Date   SPINAL FIXATION SURGERY     cervical   Patient Active Problem List   Diagnosis Date Noted   Wound of left leg 10/05/2022   Substance use disorder 10/05/2022   Abnormal EKG 07/01/2021   Atypical chest pain 07/01/2021   DOE (dyspnea on exertion) 07/01/2021   Edema 07/01/2021   Sinus bradycardia 07/01/2021   Positive colorectal cancer screening using Cologuard test 04/19/2019   Chronic anticoagulation 07/23/2018   Generalized anxiety disorder 07/19/2016   History of pulmonary embolism 07/19/2016   Nicotine  dependence, cigarettes, uncomplicated 74/25/9563   Headache 01/25/2016   Tobacco use disorder    Fall    Aphasia due to recent stroke    Stroke ([place]) 09/18/2015   Right sided weakness 09/18/2015   Right knee pain 07/21/2015   Wound of skin 06/03/2015   Chronic pain syndrome 05/28/2014   Anxiety state 05/28/2014   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   Depression 03/24/2014   Low back pain 02/16/2014   Left foot drop 02/14/2014   Edema of left lower leg 06/18/2012   HTN (hypertension) 11/15/2011   Hyperlipidemia 11/15/2011   COPD (chronic obstructive pulmonary disease) ([place]) 11/15/2011   Tobacco abuse 11/15/2011    ONSET DATE: over 1 year  REFERRING DIAG: S81.802A (ICD-10-CM) - Wound of left lower extremity, initial encounter  THERAPY DIAG:  Wound of left lower extremity, initial encounter  Other abnormalities of gait and mobility  Rationale for Evaluation and Treatment: Rehabilitation     Wound Therapy - 11/01/22 0001     Subjective Pt stated he removed bandages due to stepping into paint bucket, arrived wearing compression socks.    Patient and Family Stated Goals wounds to heal    Date of Onset 10/17/21  Prior Treatments wound care, compression    Pain Scale 0-10    Pain Score 7     Pain Type Chronic pain    Pain Location Leg    Pain Orientation Left    Evaluation and Treatment Procedures Explained to Patient/Family Yes    Evaluation and Treatment Procedures agreed to    Wound Properties Date First Assessed: 10/17/22 Time First Assessed: 1310 Wound Type: Venous stasis ulcer Location: Pretibial Location Orientation: Left Wound Description (Comments): several ulcers on anterior and posterior LLE of various sizes Present on Admission: Yes   Wound Image Images linked: 1    Dressing Type Impregnated gauze (bismuth);Compression wrap   vaseline, xeroform, profore lite with netting   Dressing Changed Changed    Dressing Status Clean, Dry, Intact    Dressing  Change Frequency PRN    Site / Wound Assessment Red;Yellow   heat present   Peri-wound Assessment Erythema (blanchable);Edema;Hemosiderin    Drainage Amount Scant    Drainage Description Serous    Treatment Cleansed    Wound Therapy - Clinical Statement Pt arrived with dressings removed on 12/20 due to stepping in paint.  Reports he has been wearing compression socks daily.  Drainage hard to assess.  Noted edema, redness and heat present, encouraged pt to contact MD for antibiotics. Therapist called Dr. Michel Bickers office and spoke to nurse.  Continued with xeroform and profore lite compression.  Pt educated to purchase new compression socks every 6 months, pt stated he owns 58 pairs.    Wound Therapy - Functional Problem List walking, sleeping, bathing    Factors Delaying/Impairing Wound Healing Diabetes Mellitus;Immobility;Multiple medical problems;Substance abuse;Vascular compromise    Hydrotherapy Plan Debridement;Dressing change;Electrical stimulation;Patient/family education;Pulsatile lavage with suction;Ultrasonic wound therapy '@35'$  KHz (+/- 3 KHz)    Wound Therapy - Frequency 2X / week    Wound Therapy - Current Recommendations PT    Wound Plan debride and dressing changes PRN    Dressing  vaseline, xeroform, 4x4s, profore lite, netting               PATIENT EDUCATION: Education details: Patient educated on exam findings, POC, scope of PT, keeping dressing intact unless soiled Person educated: Patient Education method: Consulting civil engineer, Demonstration Education comprehension: verbalized understanding, returned demonstration, verbal cues required, and tactile cues required   HOME EXERCISE PROGRAM: N/a   GOALS: Goals reviewed with patient? Yes  SHORT TERM GOALS: Target date: 11/07/2022    Patient will not have weeping from LLE in order to demonstrate reducing edema.  Baseline: Goal status: INITIAL    LONG TERM GOALS: Target date: 11/28/2022    Patients wounds to be healed to  reduce risk of infection Baseline:  Goal status: INITIAL  2.  Patient will be able to don new compression garments to reduce risk of wound devolvement.  Baseline:  Goal status: INITIAL    ASSESSMENT:  CLINICAL IMPRESSION: See above  OBJECTIVE IMPAIRMENTS: Abnormal gait, decreased endurance, decreased mobility, difficulty walking, increased edema, impaired flexibility, impaired sensation, improper body mechanics, and impaired skin integrity .   ACTIVITY LIMITATIONS: standing, squatting, stairs, transfers, bathing, locomotion level, and caring for others  PARTICIPATION LIMITATIONS: meal prep, cleaning, laundry, shopping, community activity, and yard work  PERSONAL FACTORS: Fitness, Past/current experiences, Social background, and 3+ comorbidities: chronic LBP, HTN, substance use, CHF, COPD, HLD, hx CVA, neuropathy, anxiety/depression  are also affecting patient's functional outcome.   REHAB POTENTIAL: Good  CLINICAL DECISION MAKING: Evolving/moderate complexity  EVALUATION COMPLEXITY: Moderate  PLAN: PT FREQUENCY:  2x/week  PT DURATION: 6 weeks  PLANNED INTERVENTIONS: Therapeutic exercises, Therapeutic activity, Neuromuscular re-education, Balance training, Gait training, Patient/Family education, Joint manipulation, Joint mobilization, Stair training, Orthotic/Fit training, DME instructions, Aquatic Therapy, Dry Needling, Electrical stimulation, Spinal manipulation, Spinal mobilization, Cryotherapy, Moist heat, Compression bandaging, scar mobilization, Splintting, Taping, Traction, Ultrasound, Ionotophoresis '4mg'$ /ml Dexamethasone, and Manual therapy  PLAN FOR NEXT SESSION: debride and dressing changes PRN  Ihor Austin, LPTA/CLT; CBIS [phone]  Aldona Lento, PTA 11/01/2022, 10:30 AM

## 2022-11-01 NOTE — Telephone Encounter (Signed)
Myriam Jacobson w/[facility]-  Called and stated she has been treating pt's L-leg. Requesting that pt gets antibiotics for leg due to heat & redness.  CB#-[phone]

## 2022-11-03 ENCOUNTER — Ambulatory Visit (HOSPITAL_COMMUNITY): Payer: Medicaid Other | Admitting: Physical Therapy

## 2022-11-03 DIAGNOSIS — R2689 Other abnormalities of gait and mobility: Secondary | ICD-10-CM

## 2022-11-03 DIAGNOSIS — Z7689 Persons encountering health services in other specified circumstances: Secondary | ICD-10-CM | POA: Diagnosis not present

## 2022-11-03 DIAGNOSIS — S81802A Unspecified open wound, left lower leg, initial encounter: Secondary | ICD-10-CM

## 2022-11-03 NOTE — Therapy (Signed)
OUTPATIENT PHYSICAL THERAPY WOUND TREATMENT   Patient Name: [name] [name] MRN: [id number] DOB:[birth year], 61 y.o., male Today's Date: 11/03/2022   PCP: Rubie Maid, FNP REFERRING PROVIDER: Rubie Maid, FNP  END OF SESSION:  PT End of Session - 11/03/22 0936     Visit Number 3    Number of Visits 12    Date for PT Re-Evaluation 11/28/22    Authorization Type Medicaid [place] Time Period [employer] approved 8 visits from 12/11-->12/16/22    Authorization - Visit Number 2    Authorization - Number of Visits 8    Progress Note Due on Visit 8    Activity Tolerance Patient tolerated treatment well    Behavior During Therapy [facility] for tasks assessed/performed             Past Medical History:  Diagnosis Date   Allergy    Anxiety    Asthma    Cancer ([place])    CHF (congestive heart failure) ([place])    Constipation due to pain medication    COPD (chronic obstructive pulmonary disease) ([place])    CVA (cerebral infarction) 09/18/2015   Depression    Emphysema of lung ([place])    Hyperlipidemia    Hypertension    Migraine    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   onset 02/14/14   Opioid abuse ()    PE (pulmonary embolism)    DVT left leg   Status post spinal surgery    Stroke [facility])    Tobacco abuse    Past Surgical History:  Procedure Laterality Date   SPINAL FIXATION SURGERY     cervical   Patient Active Problem List   Diagnosis Date Noted   Wound of left leg 10/05/2022   Substance use disorder 10/05/2022   Abnormal EKG 07/01/2021   Atypical chest pain 07/01/2021   DOE (dyspnea on exertion) 07/01/2021   Edema 07/01/2021   Sinus bradycardia 07/01/2021   Positive colorectal cancer screening using Cologuard test 04/19/2019   Chronic anticoagulation 07/23/2018   Generalized anxiety disorder 07/19/2016   History of pulmonary embolism 07/19/2016   Nicotine dependence, cigarettes, uncomplicated 55/97/4163   Headache 01/25/2016   Tobacco use  disorder    Fall    Aphasia due to recent stroke    Stroke ([place]) 09/18/2015   Right sided weakness 09/18/2015   Right knee pain 07/21/2015   Wound of skin 06/03/2015   Chronic pain syndrome 05/28/2014   Anxiety state 05/28/2014   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   Depression 03/24/2014   Low back pain 02/16/2014   Left foot drop 02/14/2014   Edema of left lower leg 06/18/2012   HTN (hypertension) 11/15/2011   Hyperlipidemia 11/15/2011   COPD (chronic obstructive pulmonary disease) ([place]) 11/15/2011   Tobacco abuse 11/15/2011    ONSET DATE: over 1 year  REFERRING DIAG: S81.802A (ICD-10-CM) - Wound of left lower extremity, initial encounter  THERAPY DIAG:  Wound of left lower extremity, initial encounter  Other abnormalities of gait and mobility  Rationale for Evaluation and Treatment: Rehabilitation     Wound Therapy - 11/03/22 0001     Subjective Pt states that he still has not gotten his antibiotics due to not having a ride to the pharmacy    Patient and Family Stated Goals wounds to heal    Date of Onset 10/17/21    Prior Treatments wound care, compression    Pain Scale 0-10    Pain Score 8  Pain Type Chronic pain    Pain Location Leg    Pain Orientation Left    Evaluation and Treatment Procedures Explained to Patient/Family Yes    Evaluation and Treatment Procedures agreed to    Wound Properties Date First Assessed: 10/17/22 Time First Assessed: 1310 Wound Type: Venous stasis ulcer Location: Pretibial Location Orientation: Left Wound Description (Comments): several ulcers on anterior and posterior LLE of various sizes Present on Admission: Yes   Dressing Type Compression wrap;Impregnated gauze (bismuth)    Dressing Changed Changed    Dressing Status Clean;Dry;Intact    Dressing Change Frequency PRN    Site / Wound Assessment Red    Peri-wound Assessment Erythema (blanchable);Edema    Drainage Amount Scant    Drainage Description Serous     Treatment Cleansed    Wound Therapy - Clinical Statement Wounds continue to improve with no debridement needed this session.  Posterior wound is the largest and deepest at .3 cm with anterior wounds being superficial.  Edema is still present therefore therapist changed to profore vs profore lite.    Wound Therapy - Functional Problem List walking, sleeping, bathing    Factors Delaying/Impairing Wound Healing Diabetes Mellitus;Immobility;Multiple medical problems;Substance abuse;Vascular compromise    Hydrotherapy Plan Debridement;Dressing change;Electrical stimulation;Patient/family education;Pulsatile lavage with suction;Ultrasonic wound therapy _0  KHz (+/- 3 KHz)    Wound Therapy - Frequency 2X / week    Wound Plan manual for edema, debride if needed, compression dressing to address edema.    Dressing  lotion, xeroform and profore compression bandaging  system               PATIENT EDUCATION: Education details: Patient educated on exam findings, POC, scope of PT, keeping dressing intact unless soiled Person educated: Patient Education method: Consulting civil engineer, Demonstration Education comprehension: verbalized understanding, returned demonstration, verbal cues required, and tactile cues required   HOME EXERCISE PROGRAM: N/a   GOALS: Goals reviewed with patient? Yes  SHORT TERM GOALS: Target date: 11/07/2022    Patient will not have weeping from LLE in order to demonstrate reducing edema.  Baseline: Goal status: MET    LONG TERM GOALS: Target date: 11/28/2022    Patients wounds to be healed to reduce risk of infection Baseline:  Goal status: IN PROGRESS  2.  Patient will be able to don new compression garments to reduce risk of wound devolvement.  Baseline:  Goal status: IN PROGRESS    ASSESSMENT:  CLINICAL IMPRESSION: See above  OBJECTIVE IMPAIRMENTS: Abnormal gait, decreased endurance, decreased mobility, difficulty walking, increased edema, impaired flexibility,  impaired sensation, improper body mechanics, and impaired skin integrity .   ACTIVITY LIMITATIONS: standing, squatting, stairs, transfers, bathing, locomotion level, and caring for others  PARTICIPATION LIMITATIONS: meal prep, cleaning, laundry, shopping, community activity, and yard work  PERSONAL FACTORS: Fitness, Past/current experiences, Social background, and 3+ comorbidities: chronic LBP, HTN, substance use, CHF, COPD, HLD, hx CVA, neuropathy, anxiety/depression  are also affecting patient's functional outcome.   REHAB POTENTIAL: Good  CLINICAL DECISION MAKING: Evolving/moderate complexity  EVALUATION COMPLEXITY: Moderate  PLAN: PT FREQUENCY: 2x/week  PT DURATION: 6 weeks  PLANNED INTERVENTIONS: Therapeutic exercises, Therapeutic activity, Neuromuscular re-education, Balance training, Gait training, Patient/Family education, Joint manipulation, Joint mobilization, Stair training, Orthotic/Fit training, DME instructions, Aquatic Therapy, Dry Needling, Electrical stimulation, Spinal manipulation, Spinal mobilization, Cryotherapy, Moist heat, Compression bandaging, scar mobilization, Splintting, Taping, Traction, Ultrasound, Ionotophoresis 101m/ml Dexamethasone, and Manual therapy  PLAN FOR NEXT SESSION: debride and dressing changes PRN CRayetta Humphrey PT CLT [phone] 11/03/2022,  9:43 AM

## 2022-11-04 ENCOUNTER — Telehealth: Payer: Self-pay | Admitting: Family Medicine

## 2022-11-04 NOTE — Telephone Encounter (Signed)
Received call from [facility] at [facility] to inform provider their director denied the referral sent; stated they don't treat patients for opioid addiction or substance abuse.   If additional information is required, please advise at [phone].

## 2022-11-07 DIAGNOSIS — Z419 Encounter for procedure for purposes other than remedying health state, unspecified: Secondary | ICD-10-CM | POA: Diagnosis not present

## 2022-11-08 ENCOUNTER — Ambulatory Visit: Payer: Medicaid Other | Admitting: Family Medicine

## 2022-11-09 ENCOUNTER — Ambulatory Visit (HOSPITAL_COMMUNITY): Payer: Medicaid Other | Admitting: Physical Therapy

## 2022-11-09 ENCOUNTER — Telehealth (HOSPITAL_COMMUNITY): Payer: Self-pay | Admitting: Physical Therapy

## 2022-11-09 ENCOUNTER — Encounter (HOSPITAL_COMMUNITY): Payer: Self-pay | Admitting: Physical Therapy

## 2022-11-09 NOTE — Telephone Encounter (Signed)
PT 3 rd no show:  Therapist called patient.  Pt stated that he was sick.  Therapist informed patient that this was his 3rd no show, pt replied, "I don't give a shit" and hung up on therapist.  Therapist called back to inform patient that he needs to take the dressing off and he has been discharged .  Pt did not answer the phone this time.  Therapist left a message stating that he needed to take the compression dressing off at this time and that due to no shows he would be discharged from therapy at this time.   [name] [name], [place] CLT [phone]

## 2022-11-09 NOTE — Therapy (Unsigned)
[place], [place], [address] Phone: [phone]   Fax:  [phone]  Patient Details  Name: [name] [name] MRN: [id number] Date of Birth: [birth year] Referring Provider:  No ref. provider found  Encounter Date: 11/09/2022 PHYSICAL THERAPY DISCHARGE SUMMARY  Visits from Start of Care: 3  Current functional level related to goals / functional outcomes: Unknown as pt had 3 no shows and is being discharged per no show policy.  At last visits wounds were improving.    Remaining deficits: unknown   Education / Equipment: Unknown    Patient agrees to discharge. Patient goals were not met. Patient is being discharged due to not returning since the last visit.  Rayetta Humphrey, PT CLT [phone]  11/09/2022, 11:02 AM  [place], [place], [address] Phone: [phone]   Fax:  [phone]

## 2022-11-11 ENCOUNTER — Ambulatory Visit (HOSPITAL_COMMUNITY): Payer: Medicaid Other | Admitting: Physical Therapy

## 2022-11-15 ENCOUNTER — Ambulatory Visit (HOSPITAL_COMMUNITY): Payer: Medicaid Other | Admitting: Physical Therapy

## 2022-11-16 ENCOUNTER — Encounter: Payer: Self-pay | Admitting: Family Medicine

## 2022-11-17 ENCOUNTER — Ambulatory Visit (HOSPITAL_COMMUNITY): Payer: Medicaid Other | Admitting: Physical Therapy

## 2022-12-08 ENCOUNTER — Ambulatory Visit (INDEPENDENT_AMBULATORY_CARE_PROVIDER_SITE_OTHER): Payer: Medicaid Other | Admitting: Gastroenterology

## 2022-12-08 DIAGNOSIS — Z419 Encounter for procedure for purposes other than remedying health state, unspecified: Secondary | ICD-10-CM | POA: Diagnosis not present

## 2023-01-06 DIAGNOSIS — Z419 Encounter for procedure for purposes other than remedying health state, unspecified: Secondary | ICD-10-CM | POA: Diagnosis not present

## 2023-02-06 DIAGNOSIS — Z419 Encounter for procedure for purposes other than remedying health state, unspecified: Secondary | ICD-10-CM | POA: Diagnosis not present

## 2023-03-08 DIAGNOSIS — Z419 Encounter for procedure for purposes other than remedying health state, unspecified: Secondary | ICD-10-CM | POA: Diagnosis not present

## 2023-03-14 ENCOUNTER — Encounter (INDEPENDENT_AMBULATORY_CARE_PROVIDER_SITE_OTHER): Payer: Self-pay | Admitting: *Deleted

## 2023-03-16 DIAGNOSIS — M5090 Cervical disc disorder, unspecified, unspecified cervical region: Secondary | ICD-10-CM | POA: Diagnosis not present

## 2023-03-16 DIAGNOSIS — F32A Depression, unspecified: Secondary | ICD-10-CM | POA: Diagnosis not present

## 2023-03-16 DIAGNOSIS — I739 Peripheral vascular disease, unspecified: Secondary | ICD-10-CM | POA: Diagnosis not present

## 2023-03-16 DIAGNOSIS — Z7689 Persons encountering health services in other specified circumstances: Secondary | ICD-10-CM | POA: Diagnosis not present

## 2023-03-16 DIAGNOSIS — I639 Cerebral infarction, unspecified: Secondary | ICD-10-CM | POA: Diagnosis not present

## 2023-03-23 DIAGNOSIS — F32A Depression, unspecified: Secondary | ICD-10-CM | POA: Diagnosis not present

## 2023-03-23 DIAGNOSIS — M5090 Cervical disc disorder, unspecified, unspecified cervical region: Secondary | ICD-10-CM | POA: Diagnosis not present

## 2023-03-23 DIAGNOSIS — I739 Peripheral vascular disease, unspecified: Secondary | ICD-10-CM | POA: Diagnosis not present

## 2023-03-23 DIAGNOSIS — I639 Cerebral infarction, unspecified: Secondary | ICD-10-CM | POA: Diagnosis not present

## 2023-03-28 DIAGNOSIS — Z7689 Persons encountering health services in other specified circumstances: Secondary | ICD-10-CM | POA: Diagnosis not present

## 2023-03-30 DIAGNOSIS — Z7689 Persons encountering health services in other specified circumstances: Secondary | ICD-10-CM | POA: Diagnosis not present

## 2023-03-30 DIAGNOSIS — J449 Chronic obstructive pulmonary disease, unspecified: Secondary | ICD-10-CM | POA: Diagnosis not present

## 2023-03-30 DIAGNOSIS — I10 Essential (primary) hypertension: Secondary | ICD-10-CM | POA: Diagnosis not present

## 2023-03-30 DIAGNOSIS — F32A Depression, unspecified: Secondary | ICD-10-CM | POA: Diagnosis not present

## 2023-03-30 DIAGNOSIS — I639 Cerebral infarction, unspecified: Secondary | ICD-10-CM | POA: Diagnosis not present

## 2023-04-05 DIAGNOSIS — I739 Peripheral vascular disease, unspecified: Secondary | ICD-10-CM | POA: Diagnosis not present

## 2023-04-05 DIAGNOSIS — F32A Depression, unspecified: Secondary | ICD-10-CM | POA: Diagnosis not present

## 2023-04-05 DIAGNOSIS — Z5989 Other problems related to housing and economic circumstances: Secondary | ICD-10-CM | POA: Diagnosis not present

## 2023-04-05 DIAGNOSIS — I639 Cerebral infarction, unspecified: Secondary | ICD-10-CM | POA: Diagnosis not present

## 2023-04-05 DIAGNOSIS — J449 Chronic obstructive pulmonary disease, unspecified: Secondary | ICD-10-CM | POA: Diagnosis not present

## 2023-04-05 DIAGNOSIS — I10 Essential (primary) hypertension: Secondary | ICD-10-CM | POA: Diagnosis not present

## 2023-04-08 DIAGNOSIS — Z419 Encounter for procedure for purposes other than remedying health state, unspecified: Secondary | ICD-10-CM | POA: Diagnosis not present

## 2023-04-13 DIAGNOSIS — I10 Essential (primary) hypertension: Secondary | ICD-10-CM | POA: Diagnosis not present

## 2023-04-13 DIAGNOSIS — F32A Depression, unspecified: Secondary | ICD-10-CM | POA: Diagnosis not present

## 2023-04-13 DIAGNOSIS — I639 Cerebral infarction, unspecified: Secondary | ICD-10-CM | POA: Diagnosis not present

## 2023-05-08 DIAGNOSIS — Z419 Encounter for procedure for purposes other than remedying health state, unspecified: Secondary | ICD-10-CM | POA: Diagnosis not present

## 2023-06-03 ENCOUNTER — Emergency Department (HOSPITAL_COMMUNITY)
Admission: EM | Admit: 2023-06-03 | Discharge: 2023-06-03 | Disposition: A | Discharge status: Home / Self Care | Payer: Medicaid Other | Attending: Emergency Medicine | Admitting: Emergency Medicine

## 2023-06-03 ENCOUNTER — Other Ambulatory Visit: Payer: Self-pay

## 2023-06-03 ENCOUNTER — Emergency Department (HOSPITAL_COMMUNITY): Payer: Medicaid Other

## 2023-06-03 ENCOUNTER — Encounter (HOSPITAL_COMMUNITY): Payer: Self-pay | Admitting: *Deleted

## 2023-06-03 DIAGNOSIS — Z79899 Other long term (current) drug therapy: Secondary | ICD-10-CM | POA: Diagnosis not present

## 2023-06-03 DIAGNOSIS — K429 Umbilical hernia without obstruction or gangrene: Secondary | ICD-10-CM | POA: Diagnosis not present

## 2023-06-03 DIAGNOSIS — I10 Essential (primary) hypertension: Secondary | ICD-10-CM | POA: Diagnosis not present

## 2023-06-03 DIAGNOSIS — Z7689 Persons encountering health services in other specified circumstances: Secondary | ICD-10-CM | POA: Diagnosis not present

## 2023-06-03 DIAGNOSIS — R911 Solitary pulmonary nodule: Secondary | ICD-10-CM | POA: Diagnosis not present

## 2023-06-03 DIAGNOSIS — R918 Other nonspecific abnormal finding of lung field: Secondary | ICD-10-CM | POA: Diagnosis not present

## 2023-06-03 DIAGNOSIS — R109 Unspecified abdominal pain: Secondary | ICD-10-CM | POA: Insufficient documentation

## 2023-06-03 DIAGNOSIS — N200 Calculus of kidney: Secondary | ICD-10-CM | POA: Diagnosis not present

## 2023-06-03 DIAGNOSIS — D72829 Elevated white blood cell count, unspecified: Secondary | ICD-10-CM | POA: Insufficient documentation

## 2023-06-03 DIAGNOSIS — Z7901 Long term (current) use of anticoagulants: Secondary | ICD-10-CM | POA: Insufficient documentation

## 2023-06-03 DIAGNOSIS — R1032 Left lower quadrant pain: Secondary | ICD-10-CM | POA: Diagnosis not present

## 2023-06-03 DIAGNOSIS — K8689 Other specified diseases of pancreas: Secondary | ICD-10-CM | POA: Diagnosis not present

## 2023-06-03 LAB — CBC WITH DIFFERENTIAL/PLATELET
Abs Immature Granulocytes: 0.09 10*3/uL — ABNORMAL HIGH (ref 0.00–0.07)
Basophils Absolute: 0.1 10*3/uL (ref 0.0–0.1)
Basophils Relative: 0 %
Eosinophils Absolute: 0 10*3/uL (ref 0.0–0.5)
Eosinophils Relative: 0 %
HCT: 41.7 % (ref 39.0–52.0)
Hemoglobin: 13.9 g/dL (ref 13.0–17.0)
Immature Granulocytes: 1 %
Lymphocytes Relative: 12 %
Lymphs Abs: 2 10*3/uL (ref 0.7–4.0)
MCH: 32.1 pg (ref 26.0–34.0)
MCHC: 33.3 g/dL (ref 30.0–36.0)
MCV: 96.3 fL (ref 80.0–100.0)
Monocytes Absolute: 0.8 10*3/uL (ref 0.1–1.0)
Monocytes Relative: 5 %
Neutro Abs: 13.3 10*3/uL — ABNORMAL HIGH (ref 1.7–7.7)
Neutrophils Relative %: 82 %
Platelets: 184 10*3/uL (ref 150–400)
RBC: 4.33 MIL/uL (ref 4.22–5.81)
RDW: 13.5 % (ref 11.5–15.5)
WBC: 16.3 10*3/uL — ABNORMAL HIGH (ref 4.0–10.5)
nRBC: 0 % (ref 0.0–0.2)

## 2023-06-03 LAB — URINALYSIS, W/ REFLEX TO CULTURE (INFECTION SUSPECTED)
Bacteria, UA: NONE SEEN
Bilirubin Urine: NEGATIVE
Glucose, UA: NEGATIVE mg/dL
Ketones, ur: 5 mg/dL — AB
Leukocytes,Ua: NEGATIVE
Nitrite: NEGATIVE
Protein, ur: 30 mg/dL — AB
Specific Gravity, Urine: 1.025 (ref 1.005–1.030)
pH: 5 (ref 5.0–8.0)

## 2023-06-03 LAB — BASIC METABOLIC PANEL
Anion gap: 11 (ref 5–15)
BUN: 22 mg/dL (ref 8–23)
CO2: 23 mmol/L (ref 22–32)
Calcium: 9 mg/dL (ref 8.9–10.3)
Chloride: 97 mmol/L — ABNORMAL LOW (ref 98–111)
Creatinine, Ser: 1.04 mg/dL (ref 0.61–1.24)
GFR, Estimated: >60 mL/min (ref >60)
Glucose, Bld: 131 mg/dL — ABNORMAL HIGH (ref 70–99)
Potassium: 3.9 mmol/L (ref 3.5–5.1)
Sodium: 131 mmol/L — ABNORMAL LOW (ref 135–145)

## 2023-06-03 MED ORDER — KETOROLAC TROMETHAMINE 60 MG/2ML IM SOLN
60.0000 mg | Freq: Once | INTRAMUSCULAR | Status: AC
  Administered 2023-06-03: 60 mg via INTRAMUSCULAR
  Filled 2023-06-03: qty 2

## 2023-06-03 MED ORDER — NAPROXEN 500 MG PO TABS: 500.0000 mg | ORAL_TABLET | Freq: Two times a day (BID) | ORAL | 0 refills | Status: DC

## 2023-06-03 MED ORDER — DOXYCYCLINE HYCLATE 100 MG PO CAPS: 100.0000 mg | ORAL_CAPSULE | Freq: Two times a day (BID) | ORAL | 0 refills | Status: DC

## 2023-06-03 NOTE — Discharge Instructions (Signed)
Thankfully the CT scan did not show any signs of kidney problems like kidney stones or kidney infections, I think the pain in your side is likely from your muscles for which I want you to take Naprosyn for pain however the CT scan did show that you have a new mass in your right lower lung.  This may just be pneumonia but I am concerned that it may be cancer given your history of tobacco use and smoking.  For that reason I want you to do the following:  Call your doctor tomorrow morning or Monday morning, let them know you had an abnormal CT scan and you will need to be reevaluated this week, they will need to order a formal CT scan of the chest. Take doxycycline twice a [place] for 10 days in case this is pneumonia  If the CT scan of your chest shows that you do have cancer you will need to see the cancer doctor.  Your family doctor can refer you there.  Please come back to the ER for severe or worsening symptoms

## 2023-06-03 NOTE — ED Triage Notes (Signed)
Pt c/o left flank pain that radiates around to lower left abdomen; pt states the pain started suddenly last night  Pt denies any urinary sx

## 2023-06-03 NOTE — ED Provider Notes (Signed)
[place] EMERGENCY DEPARTMENT AT [facility] Provider Note   CSN: [id number] Arrival date & time: 06/03/23  1357     History  Chief Complaint  Patient presents with   Flank Pain    [name] [name] is a 62 y.o. male.   Flank Pain  Patient is a 62 year old male, presents to the hospital with a complaint of left-sided flank pain, he has a history of high cholesterol, hypertension and is on Xarelto.  He states that about 4:00 in the morning he woke up with pain in the left flank radiating around to the left abdomen, worse with movement, hurts to stand up so he has been using his wheelchair today.  He has no shortness of breath or cough, there is no urinary symptoms, there is no dysuria or fever, he has never had a kidney stone nor has he had abdominal surgery.  He thinks he has had some heart issues but not sure exactly what they were.  He is not having any chest discomfort at all.  His arms are on remarkable and unaffected, no medications prior to arrival.     Home Medications Prior to Admission medications   Medication Sig Start Date End Date Taking? Authorizing Provider  doxycycline (VIBRAMYCIN) 100 MG capsule Take 1 capsule (100 mg total) by mouth 2 (two) times daily. 06/03/23  Yes Eber Hong, MD  hydrochlorothiazide (HYDRODIURIL) 12.5 MG tablet Take 1 tablet (12.5 mg total) by mouth daily. 10/11/22   Park Meo, FNP  naproxen (NAPROSYN) 500 MG tablet Take 1 tablet (500 mg total) by mouth 2 (two) times daily with a meal. 06/03/23  Yes Eber Hong, MD  atorvastatin (LIPITOR) 80 MG tablet TAKE 1 TABLET (80 MG TOTAL) BY MOUTH DAILY AT 6 PM. Patient taking differently: Take 80 mg by mouth daily. 03/09/16   Nani Ravens, MD  FLUoxetine (PROZAC) 20 MG capsule Take 20 mg by mouth daily.    [provider]  lisinopril (PRINIVIL,ZESTRIL) 40 MG tablet Take 1 tablet (40 mg total) by mouth daily. 03/09/16   Nani Ravens, MD  loratadine (CLARITIN) 10 MG tablet Take 10 mg  by mouth daily.    [provider]  rivaroxaban (XARELTO) 20 MG TABS tablet TAKE 1 TABLET BY MOUTH EVERY Stocking WITH SUPPER Patient taking differently: Take 20 mg by mouth daily with supper. 03/09/16   Nani Ravens, MD      Allergies    Amlodipine, Fentanyl, and Methadone    Review of Systems   Review of Systems  Genitourinary:  Positive for flank pain.  All other systems reviewed and are negative.   Physical Exam Updated Vital Signs BP (!) 170/82   Pulse 81   Temp 98.3 F (36.8 C)   Resp 19   Ht 1.803 m (5\' 11" )   Wt 95.3 kg   SpO2 97%   BMI 29.29 kg/m  Physical Exam Vitals and nursing note reviewed.  Constitutional:      General: He is not in acute distress.    Appearance: He is well-developed.  HENT:     Head: Normocephalic and atraumatic.     Mouth/Throat:     Pharynx: No oropharyngeal exudate.  Eyes:     General: No scleral icterus.       Right eye: No discharge.        Left eye: No discharge.     Conjunctiva/sclera: Conjunctivae normal.     Pupils: Pupils are equal, round, and reactive to light.  Neck:     Thyroid: No thyromegaly.     Vascular: No JVD.  Cardiovascular:     Rate and Rhythm: Normal rate and regular rhythm.     Heart sounds: Normal heart sounds. No murmur heard.    No friction rub. No gallop.  Pulmonary:     Effort: Pulmonary effort is normal. No respiratory distress.     Breath sounds: Normal breath sounds. No wheezing or rales.  Chest:     Chest wall: No tenderness.  Abdominal:     General: Bowel sounds are normal. There is no distension.     Palpations: Abdomen is soft. There is no mass.     Tenderness: There is abdominal tenderness.     Comments: Tender from the left CVA region around the abdomen to the left anterior abdomen inferior to the costal margin, there is no rash seen in this area  Musculoskeletal:        General: No tenderness. Normal range of motion.     Cervical back: Normal range of motion and neck supple.      Right lower leg: No edema.     Left lower leg: No edema.  Lymphadenopathy:     Cervical: No cervical adenopathy.  Skin:    General: Skin is warm and dry.     Findings: No erythema or rash.  Neurological:     General: No focal deficit present.     Mental Status: He is alert.     Coordination: Coordination normal.  Psychiatric:        Behavior: Behavior normal.     ED Results / Procedures / Treatments   Labs (all labs ordered are listed, but only abnormal results are displayed) Labs Reviewed  BASIC METABOLIC PANEL - Abnormal; Notable for the following components:      Result Value   Sodium 131 (*)    Chloride 97 (*)    Glucose, Bld 131 (*)    All other components within normal limits  CBC WITH DIFFERENTIAL/PLATELET - Abnormal; Notable for the following components:   WBC 16.3 (*)    Neutro Abs 13.3 (*)    Abs Immature Granulocytes 0.09 (*)    All other components within normal limits  URINALYSIS, W/ REFLEX TO CULTURE (INFECTION SUSPECTED) - Abnormal; Notable for the following components:   APPearance HAZY (*)    Hgb urine dipstick SMALL (*)    Ketones, ur 5 (*)    Protein, ur 30 (*)    All other components within normal limits    EKG None  Radiology CT Renal Stone Study  Result Date: 06/03/2023 CLINICAL DATA:  Left-sided flank pain. EXAM: CT ABDOMEN AND PELVIS WITHOUT CONTRAST TECHNIQUE: Multidetector CT imaging of the abdomen and pelvis was performed following the standard protocol without IV contrast. RADIATION DOSE REDUCTION: This exam was performed according to the departmental dose-optimization program which includes automated exposure control, adjustment of the mA and/or kV according to patient size and/or use of iterative reconstruction technique. COMPARISON:  CT 11/22/2021 FINDINGS: Lower chest: No pleural effusion lung bases. There is patchy consolidative opacity along the inferior aspect of the middle lobe. There are reticulonodular changes identified along the  lower lobes and lingula. Acute infiltrative process is possible. Recommend further evaluation when appropriate. Hepatobiliary: On this non IV contrast exam, grossly the liver is unremarkable. Segment 4 low-attenuation liver lesion seen consistent with a benign cysts. No specific imaging follow-up. Unchanged from prior. Gallbladder is contracted. Pancreas: Moderate atrophy of the pancreas.  No obvious mass. Spleen: Normal in size without focal abnormality. Adrenals/Urinary Tract: Adrenal glands are preserved. 2-3 mm upper pole nonobstructing right-sided renal stone. Punctate focus in the left kidney as well. No ureteral stones. Once again there is a Bosniak 1 lower pole cyst on the left, unchanged from previous. There is hyperdense lesion exophytic from the upper pole left kidney as on prior as well. Diameter of 15 mm on coronal image 67 Hounsfield units of 20. Based on appearance favor a Bosniak 2 lesion. No specific additional follow-up. The ureters have normal course and caliber of the normal caliber contracted urinary bladder. Stomach/Bowel: Moderate debris in the stomach. Small bowel is nondilated. Moderate scattered colonic stool. Normal appendix. Large bowel is of normal course and caliber. Vascular/Lymphatic: Aortic atherosclerosis. No enlarged abdominal or pelvic lymph nodes. Reproductive: Prostate is unremarkable. Other: Small fat containing umbilical hernia. Musculoskeletal: Degenerative changes seen along the spine. Degenerative changes of the pelvis. IMPRESSION: No bowel obstruction, free air or free fluid.  Scattered stool. Bilateral punctate nonobstructing renal stones. Consolidative opacity in the middle lobe with some reticulonodular changes, tree-in-bud changes in this location as well as along the left lung. Please correlate for an infiltrative process. Follow up chest CT when appropriate to further delineate. Electronically Signed   By: Karen Kays M.D.   On: 06/03/2023 16:11     Procedures Procedures    Medications Ordered in ED Medications  ketorolac (TORADOL) injection 60 mg (60 mg Intramuscular Given 06/03/23 1538)    ED Course/ Medical Decision Making/ A&P                             Medical Decision Making Amount and/or Complexity of Data Reviewed Labs: ordered. Radiology: ordered.  Risk Prescription drug management.   This patient is in no distress, vitals reflect mild hypertension, he has tenderness with movement and with palpation in the area that could be consistent with things such as kidney stones or zoster, he has no history of either and appears to be very muscular.  Nevertheless we will obtain CT renal study urinalysis and labs.  The patient is agreeable.  This does not sound cardiac in nature   Additional history obtained:  Additional history obtained from EMR External records from outside source obtained and reviewed including multiple office visits, no recent admissions to the hospital, has had recurrent cellulitis of his leg   Lab Tests:  I Ordered, and personally interpreted labs.  The pertinent results include: CBC elevated around 16,000 urinalysis clean   Imaging Studies ordered:  I ordered imaging studies including CT scan of the abdomen and pelvis I independently visualized and interpreted imaging which showed no kidney stones or pelvic pathology, there does appear to be a right lower lobe abnormality that could be consistent with a mass or tumor or possibly infection I agree with the radiologist interpretation   Medicines ordered and prescription drug management:  I ordered medication including doxycycline and Naprosyn for home, Toradol was given here Reevaluation of the patient after these medicines showed that the patient improved I have reviewed the patients home medicines and have made adjustments as needed   Problem List / ED Course:  The patient was given all of his results including the abnormal CT scan of  the chest and the concern for cancer.  He expressed his understanding and the need to follow-up for a formal CT scan of the chest.  The patient is stable for  discharge, whether his leukocytosis is coming from his lower extremities where there is a stasis dermatitis or possibly cellulitis or a possible pneumonia either way the patient appears clinically well without fever or tachycardia, no signs of sepsis stable for discharge.   Social Determinants of Health:  None           Final Clinical Impression(s) / ED Diagnoses Final diagnoses:  Right lower lobe lung mass  Leukocytosis, unspecified type  Left flank pain    Rx / DC Orders ED Discharge Orders          Ordered    doxycycline (VIBRAMYCIN) 100 MG capsule  2 times daily        06/03/23 1709    naproxen (NAPROSYN) 500 MG tablet  2 times daily with meals        06/03/23 1709              Eber Hong, MD 06/03/23 1713

## 2023-06-05 ENCOUNTER — Telehealth: Payer: Self-pay

## 2023-06-05 NOTE — Telephone Encounter (Signed)
Opened in error  Pt no longer seeing practice    Woodfin Ganja LPN [facility] Nurse Health Advisor Direct Dial [phone]

## 2023-06-07 DIAGNOSIS — F17210 Nicotine dependence, cigarettes, uncomplicated: Secondary | ICD-10-CM | POA: Diagnosis not present

## 2023-06-07 DIAGNOSIS — Z7689 Persons encountering health services in other specified circumstances: Secondary | ICD-10-CM | POA: Diagnosis not present

## 2023-06-07 DIAGNOSIS — E559 Vitamin D deficiency, unspecified: Secondary | ICD-10-CM | POA: Diagnosis not present

## 2023-06-07 DIAGNOSIS — R5383 Other fatigue: Secondary | ICD-10-CM | POA: Diagnosis not present

## 2023-06-07 DIAGNOSIS — Z0000 Encounter for general adult medical examination without abnormal findings: Secondary | ICD-10-CM | POA: Diagnosis not present

## 2023-06-08 DIAGNOSIS — Z419 Encounter for procedure for purposes other than remedying health state, unspecified: Secondary | ICD-10-CM | POA: Diagnosis not present

## 2023-06-13 DIAGNOSIS — R9389 Abnormal findings on diagnostic imaging of other specified body structures: Secondary | ICD-10-CM | POA: Diagnosis not present

## 2023-06-13 DIAGNOSIS — Z7689 Persons encountering health services in other specified circumstances: Secondary | ICD-10-CM | POA: Diagnosis not present

## 2023-06-13 DIAGNOSIS — F17210 Nicotine dependence, cigarettes, uncomplicated: Secondary | ICD-10-CM | POA: Diagnosis not present

## 2023-06-13 DIAGNOSIS — I10 Essential (primary) hypertension: Secondary | ICD-10-CM | POA: Diagnosis not present

## 2023-06-13 DIAGNOSIS — R634 Abnormal weight loss: Secondary | ICD-10-CM | POA: Diagnosis not present

## 2023-06-13 DIAGNOSIS — R918 Other nonspecific abnormal finding of lung field: Secondary | ICD-10-CM | POA: Diagnosis not present

## 2023-06-13 DIAGNOSIS — R0602 Shortness of breath: Secondary | ICD-10-CM | POA: Diagnosis not present

## 2023-06-22 DIAGNOSIS — Z7689 Persons encountering health services in other specified circumstances: Secondary | ICD-10-CM | POA: Diagnosis not present

## 2023-06-26 DIAGNOSIS — F5101 Primary insomnia: Secondary | ICD-10-CM | POA: Diagnosis not present

## 2023-06-26 DIAGNOSIS — E559 Vitamin D deficiency, unspecified: Secondary | ICD-10-CM | POA: Diagnosis not present

## 2023-06-26 DIAGNOSIS — M5432 Sciatica, left side: Secondary | ICD-10-CM | POA: Diagnosis not present

## 2023-06-26 DIAGNOSIS — Z7689 Persons encountering health services in other specified circumstances: Secondary | ICD-10-CM | POA: Diagnosis not present

## 2023-06-26 DIAGNOSIS — R030 Elevated blood-pressure reading, without diagnosis of hypertension: Secondary | ICD-10-CM | POA: Diagnosis not present

## 2023-06-26 DIAGNOSIS — I10 Essential (primary) hypertension: Secondary | ICD-10-CM | POA: Diagnosis not present

## 2023-06-26 DIAGNOSIS — M79604 Pain in right leg: Secondary | ICD-10-CM | POA: Diagnosis not present

## 2023-06-26 DIAGNOSIS — M79605 Pain in left leg: Secondary | ICD-10-CM | POA: Diagnosis not present

## 2023-06-26 DIAGNOSIS — I872 Venous insufficiency (chronic) (peripheral): Secondary | ICD-10-CM | POA: Diagnosis not present

## 2023-06-27 DIAGNOSIS — Z7689 Persons encountering health services in other specified circumstances: Secondary | ICD-10-CM | POA: Diagnosis not present

## 2023-06-30 ENCOUNTER — Emergency Department (HOSPITAL_COMMUNITY)
Admission: EM | Admit: 2023-06-30 | Discharge: 2023-06-30 | Disposition: A | Discharge status: Home / Self Care | Payer: Medicaid Other | Attending: Emergency Medicine | Admitting: Emergency Medicine

## 2023-06-30 ENCOUNTER — Other Ambulatory Visit: Payer: Self-pay

## 2023-06-30 ENCOUNTER — Emergency Department (HOSPITAL_COMMUNITY): Payer: Medicaid Other

## 2023-06-30 ENCOUNTER — Encounter (HOSPITAL_COMMUNITY): Payer: Self-pay | Admitting: Emergency Medicine

## 2023-06-30 DIAGNOSIS — I110 Hypertensive heart disease with heart failure: Secondary | ICD-10-CM | POA: Diagnosis not present

## 2023-06-30 DIAGNOSIS — J45909 Unspecified asthma, uncomplicated: Secondary | ICD-10-CM | POA: Insufficient documentation

## 2023-06-30 DIAGNOSIS — J449 Chronic obstructive pulmonary disease, unspecified: Secondary | ICD-10-CM | POA: Insufficient documentation

## 2023-06-30 DIAGNOSIS — R519 Headache, unspecified: Secondary | ICD-10-CM

## 2023-06-30 DIAGNOSIS — I672 Cerebral atherosclerosis: Secondary | ICD-10-CM | POA: Diagnosis not present

## 2023-06-30 DIAGNOSIS — I509 Heart failure, unspecified: Secondary | ICD-10-CM | POA: Insufficient documentation

## 2023-06-30 DIAGNOSIS — M79604 Pain in right leg: Secondary | ICD-10-CM | POA: Diagnosis not present

## 2023-06-30 DIAGNOSIS — M79605 Pain in left leg: Secondary | ICD-10-CM | POA: Diagnosis not present

## 2023-06-30 DIAGNOSIS — Z7689 Persons encountering health services in other specified circumstances: Secondary | ICD-10-CM | POA: Diagnosis not present

## 2023-06-30 DIAGNOSIS — Z79899 Other long term (current) drug therapy: Secondary | ICD-10-CM | POA: Insufficient documentation

## 2023-06-30 DIAGNOSIS — I10 Essential (primary) hypertension: Secondary | ICD-10-CM

## 2023-06-30 LAB — CBC
HCT: 40.1 % (ref 39.0–52.0)
Hemoglobin: 13.5 g/dL (ref 13.0–17.0)
MCH: 32.2 pg (ref 26.0–34.0)
MCHC: 33.7 g/dL (ref 30.0–36.0)
MCV: 95.7 fL (ref 80.0–100.0)
Platelets: 192 10*3/uL (ref 150–400)
RBC: 4.19 MIL/uL — ABNORMAL LOW (ref 4.22–5.81)
RDW: 14.1 % (ref 11.5–15.5)
WBC: 7.1 10*3/uL (ref 4.0–10.5)
nRBC: 0 % (ref 0.0–0.2)

## 2023-06-30 LAB — BASIC METABOLIC PANEL
Anion gap: 9 (ref 5–15)
BUN: 15 mg/dL (ref 8–23)
CO2: 25 mmol/L (ref 22–32)
Calcium: 8.4 mg/dL — ABNORMAL LOW (ref 8.9–10.3)
Chloride: 99 mmol/L (ref 98–111)
Creatinine, Ser: 0.62 mg/dL (ref 0.61–1.24)
GFR, Estimated: >60 mL/min (ref >60)
Glucose, Bld: 85 mg/dL (ref 70–99)
Potassium: 3.3 mmol/L — ABNORMAL LOW (ref 3.5–5.1)
Sodium: 133 mmol/L — ABNORMAL LOW (ref 135–145)

## 2023-06-30 MED ORDER — PROCHLORPERAZINE EDISYLATE 10 MG/2ML IJ SOLN
5.0000 mg | Freq: Once | INTRAMUSCULAR | Status: AC
  Administered 2023-06-30: 5 mg via INTRAVENOUS
  Filled 2023-06-30: qty 2

## 2023-06-30 NOTE — ED Triage Notes (Signed)
Pt states he has had a headache since 3pm today and that his BP has been running high ( 204/105). Pt states he is compliant with his BP meds.

## 2023-06-30 NOTE — ED Provider Notes (Incomplete)
[facility] AT [facility] Provider Note   CSN: [id number] Arrival date & time: 06/30/23  2041     History {Add pertinent medical, surgical, social history, OB history to HPI:1} Chief Complaint  Patient presents with  . Headache  . Hypertension    [name] [name] is a 62 y.o. male.   Headache Hypertension Associated symptoms include headaches.  Patient with headache and hypertension.  Began around 3:00 today.  Started with a headache and then felt blood pressure go up.  Dull headache.  No vision changes.  No numbness weakness.  States he does have some neuropathy in his feet but that is chronic.  Not thunderclap.  No chest pain.  History of hypertension and claims compliance with this medication.    Past Medical History:  Diagnosis Date  . Allergy   . Anxiety   . Asthma   . Cancer (HCC)   . CHF (congestive heart failure) (HCC)   . Constipation due to pain medication   . COPD (chronic obstructive pulmonary disease) (HCC)   . CVA (cerebral infarction) 09/18/2015  . Depression   . Emphysema of lung (HCC)   . Hyperlipidemia   . Hypertension   . Migraine   . 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 Opioid abuse (HCC)   . PE (pulmonary embolism)    DVT left leg  . Status post spinal surgery   . Stroke (HCC)   . Tobacco abuse     Home Medications Prior to Admission medications   Medication Sig Start Date End Date Taking? Authorizing Provider  cloNIDine (CATAPRES) 0.1 MG tablet Take 0.1 mg by mouth 3 (three) times daily.   Yes [provider]  cyclobenzaprine (FLEXERIL) 10 MG tablet Take 10 mg by mouth 3 (three) times daily as needed for muscle spasms.   Yes [provider]  DULoxetine (CYMBALTA) 30 MG capsule Take 30 mg by mouth 2 (two) times daily.   Yes [provider]  gabapentin (NEURONTIN) 300 MG capsule Take 300 mg by mouth 3 (three) times daily.   Yes [provider]   hydrochlorothiazide (HYDRODIURIL) 12.5 MG tablet Take 1 tablet (12.5 mg total) by mouth daily. 10/11/22   Park Meo, FNP  lisinopril-hydrochlorothiazide (ZESTORETIC) 10-12.5 MG tablet Take 1 tablet by mouth daily.   Yes [provider]  QUEtiapine (SEROQUEL) 25 MG tablet Take 25 mg by mouth at bedtime.   Yes [provider]  atorvastatin (LIPITOR) 80 MG tablet TAKE 1 TABLET (80 MG TOTAL) BY MOUTH DAILY AT 6 PM. Patient taking differently: Take 80 mg by mouth daily. 03/09/16   Nani Ravens, MD  doxycycline (VIBRAMYCIN) 100 MG capsule Take 1 capsule (100 mg total) by mouth 2 (two) times daily. 06/03/23   Eber Hong, MD  FLUoxetine (PROZAC) 20 MG capsule Take 20 mg by mouth daily.    [provider]  lisinopril (PRINIVIL,ZESTRIL) 40 MG tablet Take 1 tablet (40 mg total) by mouth daily. 03/09/16   Nani Ravens, MD  loratadine (CLARITIN) 10 MG tablet Take 10 mg by mouth daily.    [provider]  naproxen (NAPROSYN) 500 MG tablet Take 1 tablet (500 mg total) by mouth 2 (two) times daily with a meal. 06/03/23   Eber Hong, MD  rivaroxaban (XARELTO) 20 MG TABS tablet TAKE 1 TABLET BY MOUTH EVERY [place] WITH SUPPER Patient taking differently: Take 20 mg by mouth daily with supper. 03/09/16   Waynetta Sandy,  Hulan Amato, MD      Allergies    Amlodipine, Fentanyl, and Methadone    Review of Systems   Review of Systems  Neurological:  Positive for headaches.    Physical Exam Updated Vital Signs BP (!) 161/107   Pulse (!) 50   Temp 98.5 F (36.9 C) (Oral)   Resp 19   Ht 5\' 11"  (1.803 m)   Wt 77.6 kg   SpO2 99%   BMI 23.85 kg/m  Physical Exam Vitals reviewed.  Neurological:     Mental Status: He is alert.     ED Results / Procedures / Treatments   Labs (all labs ordered are listed, but only abnormal results are displayed) Labs Reviewed  BASIC METABOLIC PANEL - Abnormal; Notable for the following components:      Result Value   Sodium 133 (*)     Potassium 3.3 (*)    Calcium 8.4 (*)    All other components within normal limits  CBC - Abnormal; Notable for the following components:   RBC 4.19 (*)    All other components within normal limits    EKG EKG Interpretation Date/Time:  Friday June 30 2023 21:05:26 EDT Ventricular Rate:  53 PR Interval:  167 QRS Duration:  88 QT Interval:  471 QTC Calculation: 443 R Axis:   8  Text Interpretation: Sinus rhythm Confirmed by Benjiman Core [phone]) on 06/30/2023 9:22:39 PM  Radiology CT Head Wo Contrast  Result Date: 06/30/2023 CLINICAL DATA:  Headache, new onset (Age >= 51y) EXAM: CT HEAD WITHOUT CONTRAST TECHNIQUE: Contiguous axial images were obtained from the base of the skull through the vertex without intravenous contrast. RADIATION DOSE REDUCTION: This exam was performed according to the departmental dose-optimization program which includes automated exposure control, adjustment of the mA and/or kV according to patient size and/or use of iterative reconstruction technique. COMPARISON:  11/22/2021 FINDINGS: Brain: No evidence of acute infarction, hemorrhage, hydrocephalus, extra-axial collection or mass lesion/mass effect. Stable appearance of remote left MCA infarct. Vascular: Atherosclerosis of skullbase vasculature without hyperdense vessel or abnormal calcification. Skull: No fracture or focal lesion. Sinuses/Orbits: Paranasal sinuses and mastoid air cells are clear. The visualized orbits are unremarkable. Rightward nasal septal deviation. Other: None. IMPRESSION: 1. No acute intracranial abnormality. 2. Stable appearance of remote left MCA infarct. Electronically Signed   By: Narda Rutherford M.D.   On: 06/30/2023 21:56    Procedures Procedures  {Document cardiac monitor, telemetry assessment procedure when appropriate:1}  Medications Ordered in ED Medications  prochlorperazine (COMPAZINE) injection 5 mg (5 mg Intravenous Given 06/30/23 2133)    ED Course/ Medical Decision  Making/ A&P   {   Click here for ABCD2, HEART and other calculatorsREFRESH Note before signing :1}                              Medical Decision Making Amount and/or Complexity of Data Reviewed Labs: ordered. Radiology: ordered.  Risk Prescription drug management.   ***  {Document critical care time when appropriate:1} {Document review of labs and clinical decision tools ie heart score, Chads2Vasc2 etc:1}  {Document your independent review of radiology images, and any outside records:1} {Document your discussion with family members, caretakers, and with consultants:1} {Document social determinants of health affecting pt's care:1} {Document your decision making why or why not admission, treatments were needed:1} Final Clinical Impression(s) / ED Diagnoses Final diagnoses:  Hypertension, unspecified type  Bad headache    Rx /  DC Orders ED Discharge Orders     None

## 2023-06-30 NOTE — ED Provider Notes (Signed)
[facility] DEPARTMENT AT [facility] Provider Note   CSN: [id number] Arrival date & time: 06/30/23  2041     History  Chief Complaint  Patient presents with   Headache   Hypertension    [name] [name] is a 62 y.o. male.   Headache Hypertension Associated symptoms include headaches.  Patient with headache and hypertension.  Began around 3:00 today.  Started with a headache and then felt blood pressure go up.  Dull headache.  No vision changes.  No numbness weakness.  States he does have some neuropathy in his feet but that is chronic.  Not thunderclap.  No chest pain.  History of hypertension and claims compliance with this medication.    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 Authorizing Provider  cloNIDine (CATAPRES) 0.1 MG tablet Take 0.1 mg by mouth 3 (three) times daily.   Yes [provider]  cyclobenzaprine (FLEXERIL) 10 MG tablet Take 10 mg by mouth 3 (three) times daily as needed for muscle spasms.   Yes [provider]  DULoxetine (CYMBALTA) 30 MG capsule Take 30 mg by mouth 2 (two) times daily.   Yes [provider]  gabapentin (NEURONTIN) 300 MG capsule Take 300 mg by mouth 3 (three) times daily.   Yes [provider]  hydrochlorothiazide (HYDRODIURIL) 12.5 MG tablet Take 1 tablet (12.5 mg total) by mouth daily.  10/11/22   Park Meo, FNP  lisinopril-hydrochlorothiazide (ZESTORETIC) 10-12.5 MG tablet Take 1 tablet by mouth daily.   Yes [provider]  QUEtiapine (SEROQUEL) 25 MG tablet Take 25 mg by mouth at bedtime.   Yes [provider]  atorvastatin (LIPITOR) 80 MG tablet TAKE 1 TABLET (80 MG TOTAL) BY MOUTH DAILY AT 6 PM. Patient taking differently: Take 80 mg by mouth daily. 03/09/16   Nani Ravens, MD  doxycycline (VIBRAMYCIN) 100 MG capsule Take 1 capsule (100 mg total) by mouth 2 (two) times daily. 06/03/23   Eber Hong, MD  FLUoxetine (PROZAC) 20 MG capsule Take 20 mg by mouth daily.    [provider]  lisinopril (PRINIVIL,ZESTRIL) 40 MG tablet Take 1 tablet (40 mg total) by mouth daily. 03/09/16   Nani Ravens, MD  loratadine (CLARITIN) 10 MG tablet Take 10 mg by mouth daily.    [provider]  naproxen (NAPROSYN) 500 MG tablet Take 1 tablet (500 mg total) by mouth 2 (two) times daily with a meal. 06/03/23   Eber Hong, MD  rivaroxaban (XARELTO) 20 MG TABS tablet TAKE 1 TABLET BY MOUTH EVERY [place] WITH SUPPER Patient taking differently: Take 20 mg by mouth daily with supper. 03/09/16   Nani Ravens, MD      Allergies  Amlodipine, Fentanyl, and Methadone    Review of Systems   Review of Systems  Neurological:  Positive for headaches.    Physical Exam Updated Vital Signs BP (!) 161/107   Pulse (!) 50   Temp 98.5 F (36.9 C) (Oral)   Resp 19   Ht 5\' 11"  (1.803 m)   Wt 77.6 kg   SpO2 99%   BMI 23.85 kg/m  Physical Exam Vitals reviewed.  Neurological:     Mental Status: He is alert.     ED Results / Procedures / Treatments   Labs (all labs ordered are listed, but only abnormal results are displayed) Labs Reviewed  BASIC METABOLIC PANEL - Abnormal; Notable for the following components:      Result Value   Sodium 133 (*)    Potassium 3.3 (*)    Calcium 8.4 (*)    All other components within normal limits  CBC - Abnormal;  Notable for the following components:   RBC 4.19 (*)    All other components within normal limits    EKG EKG Interpretation Date/Time:  Friday June 30 2023 21:05:26 EDT Ventricular Rate:  53 PR Interval:  167 QRS Duration:  88 QT Interval:  471 QTC Calculation: 443 R Axis:   8  Text Interpretation: Sinus rhythm Confirmed by Benjiman Core [phone]) on 06/30/2023 9:22:39 PM  Radiology CT Head Wo Contrast  Result Date: 06/30/2023 CLINICAL DATA:  Headache, new onset (Age >= 51y) EXAM: CT HEAD WITHOUT CONTRAST TECHNIQUE: Contiguous axial images were obtained from the base of the skull through the vertex without intravenous contrast. RADIATION DOSE REDUCTION: This exam was performed according to the departmental dose-optimization program which includes automated exposure control, adjustment of the mA and/or kV according to patient size and/or use of iterative reconstruction technique. COMPARISON:  11/22/2021 FINDINGS: Brain: No evidence of acute infarction, hemorrhage, hydrocephalus, extra-axial collection or mass lesion/mass effect. Stable appearance of remote left MCA infarct. Vascular: Atherosclerosis of skullbase vasculature without hyperdense vessel or abnormal calcification. Skull: No fracture or focal lesion. Sinuses/Orbits: Paranasal sinuses and mastoid air cells are clear. The visualized orbits are unremarkable. Rightward nasal septal deviation. Other: None. IMPRESSION: 1. No acute intracranial abnormality. 2. Stable appearance of remote left MCA infarct. Electronically Signed   By: Narda Rutherford M.D.   On: 06/30/2023 21:56    Procedures Procedures    Medications Ordered in ED Medications  prochlorperazine (COMPAZINE) injection 5 mg (5 mg Intravenous Given 06/30/23 2133)    ED Course/ Medical Decision Making/ A&P                                 Medical Decision Making Amount and/or Complexity of Data Reviewed Labs: ordered. Radiology: ordered.  Risk Prescription drug  management.   Patient with headache.  Hypertension.  Nonfocal exam but with severe headache which is unusual for him get head CT with was also reassuring.  Blood work reassuring.  Feeling better after treatment with Compazine.  Blood pressures also come down to 160/107.  Can take his blood pressure meds at home.  Appears stable for discharge home.  Do not see endorgan damage at this time.        Final Clinical Impression(s) / ED Diagnoses Final diagnoses:  Hypertension, unspecified type  Bad headache    Rx / DC Orders ED Discharge Orders     None         Benjiman Core, MD  07/01/23 0011  

## 2023-07-03 ENCOUNTER — Telehealth: Payer: Self-pay

## 2023-07-03 NOTE — Transitions of Care (Post Inpatient/ED Visit) (Unsigned)
   07/03/2023  Name: [name] [name] MRN: [id number] DOB: [birth year]  Today's TOC FU Call Status: Today's TOC FU Call Status:: Unsuccessful Call (1st Attempt) Unsuccessful Call (1st Attempt) Date: 07/03/23  Attempted to reach the patient regarding the most recent Inpatient/ED visit.  Follow Up Plan: Additional outreach attempts will be made to reach the patient to complete the Transitions of Care (Post Inpatient/ED visit) call.   Signature   Woodfin Ganja LPN [facility] Nurse Health Advisor Direct Dial [phone]

## 2023-07-04 NOTE — Transitions of Care (Post Inpatient/ED Visit) (Signed)
   07/04/2023  Name: Markeese Alcivar MRN: [id number] DOB: Jun 26, 1961  Pt no longer following Dr Dimas Aguas - opened in error    Woodfin Ganja LPN [facility] Nurse Health Advisor Direct Dial [phone]

## 2023-07-06 DIAGNOSIS — R634 Abnormal weight loss: Secondary | ICD-10-CM | POA: Diagnosis not present

## 2023-07-06 DIAGNOSIS — R918 Other nonspecific abnormal finding of lung field: Secondary | ICD-10-CM | POA: Diagnosis not present

## 2023-07-06 DIAGNOSIS — Z7689 Persons encountering health services in other specified circumstances: Secondary | ICD-10-CM | POA: Diagnosis not present

## 2023-07-06 DIAGNOSIS — I10 Essential (primary) hypertension: Secondary | ICD-10-CM | POA: Diagnosis not present

## 2023-07-06 DIAGNOSIS — F17210 Nicotine dependence, cigarettes, uncomplicated: Secondary | ICD-10-CM | POA: Diagnosis not present

## 2023-07-09 DIAGNOSIS — Z419 Encounter for procedure for purposes other than remedying health state, unspecified: Secondary | ICD-10-CM | POA: Diagnosis not present

## 2023-07-11 ENCOUNTER — Emergency Department (HOSPITAL_COMMUNITY)
Admission: EM | Admit: 2023-07-11 | Discharge: 2023-07-11 | Disposition: A | Discharge status: Home / Self Care | Payer: Medicaid Other | Attending: Emergency Medicine | Admitting: Emergency Medicine

## 2023-07-11 ENCOUNTER — Emergency Department (HOSPITAL_COMMUNITY): Payer: Medicaid Other

## 2023-07-11 DIAGNOSIS — I160 Hypertensive urgency: Secondary | ICD-10-CM | POA: Diagnosis not present

## 2023-07-11 DIAGNOSIS — M25562 Pain in left knee: Secondary | ICD-10-CM | POA: Insufficient documentation

## 2023-07-11 DIAGNOSIS — I10 Essential (primary) hypertension: Secondary | ICD-10-CM | POA: Diagnosis not present

## 2023-07-11 DIAGNOSIS — M79605 Pain in left leg: Secondary | ICD-10-CM | POA: Diagnosis not present

## 2023-07-11 DIAGNOSIS — Z7901 Long term (current) use of anticoagulants: Secondary | ICD-10-CM | POA: Insufficient documentation

## 2023-07-11 DIAGNOSIS — Z79899 Other long term (current) drug therapy: Secondary | ICD-10-CM | POA: Diagnosis not present

## 2023-07-11 DIAGNOSIS — I82442 Acute embolism and thrombosis of left tibial vein: Secondary | ICD-10-CM | POA: Diagnosis not present

## 2023-07-11 DIAGNOSIS — J449 Chronic obstructive pulmonary disease, unspecified: Secondary | ICD-10-CM | POA: Insufficient documentation

## 2023-07-11 DIAGNOSIS — Z743 Need for continuous supervision: Secondary | ICD-10-CM | POA: Diagnosis not present

## 2023-07-11 DIAGNOSIS — M7989 Other specified soft tissue disorders: Secondary | ICD-10-CM | POA: Diagnosis not present

## 2023-07-11 DIAGNOSIS — F32A Depression, unspecified: Secondary | ICD-10-CM | POA: Insufficient documentation

## 2023-07-11 DIAGNOSIS — I700 Atherosclerosis of aorta: Secondary | ICD-10-CM | POA: Diagnosis not present

## 2023-07-11 DIAGNOSIS — I739 Peripheral vascular disease, unspecified: Secondary | ICD-10-CM | POA: Diagnosis not present

## 2023-07-11 DIAGNOSIS — R5381 Other malaise: Secondary | ICD-10-CM | POA: Diagnosis not present

## 2023-07-11 DIAGNOSIS — R519 Headache, unspecified: Secondary | ICD-10-CM | POA: Diagnosis not present

## 2023-07-11 DIAGNOSIS — M549 Dorsalgia, unspecified: Secondary | ICD-10-CM | POA: Diagnosis not present

## 2023-07-11 DIAGNOSIS — I824Z2 Acute embolism and thrombosis of unspecified deep veins of left distal lower extremity: Secondary | ICD-10-CM

## 2023-07-11 LAB — COMPREHENSIVE METABOLIC PANEL
ALT: 12 U/L (ref 0–44)
AST: 17 U/L (ref 15–41)
Albumin: 3.9 g/dL (ref 3.5–5.0)
Alkaline Phosphatase: 77 U/L (ref 38–126)
Anion gap: 10 (ref 5–15)
BUN: 13 mg/dL (ref 8–23)
CO2: 25 mmol/L (ref 22–32)
Calcium: 8.5 mg/dL — ABNORMAL LOW (ref 8.9–10.3)
Chloride: 102 mmol/L (ref 98–111)
Creatinine, Ser: 0.69 mg/dL (ref 0.61–1.24)
GFR, Estimated: >60 mL/min (ref >60)
Glucose, Bld: 93 mg/dL (ref 70–99)
Potassium: 3.4 mmol/L — ABNORMAL LOW (ref 3.5–5.1)
Sodium: 137 mmol/L (ref 135–145)
Total Bilirubin: 0.7 mg/dL (ref 0.3–1.2)
Total Protein: 7.4 g/dL (ref 6.5–8.1)

## 2023-07-11 LAB — URINALYSIS, ROUTINE W REFLEX MICROSCOPIC
Bacteria, UA: NONE SEEN
Bilirubin Urine: NEGATIVE
Glucose, UA: 50 mg/dL — AB
Ketones, ur: 5 mg/dL — AB
Leukocytes,Ua: NEGATIVE
Nitrite: NEGATIVE
Protein, ur: NEGATIVE mg/dL
Specific Gravity, Urine: 1.006 (ref 1.005–1.030)
pH: 8 (ref 5.0–8.0)

## 2023-07-11 LAB — CBC WITH DIFFERENTIAL/PLATELET
Abs Immature Granulocytes: 0.04 10*3/uL (ref 0.00–0.07)
Basophils Absolute: 0.1 10*3/uL (ref 0.0–0.1)
Basophils Relative: 1 %
Eosinophils Absolute: 0.1 10*3/uL (ref 0.0–0.5)
Eosinophils Relative: 1 %
HCT: 40.4 % (ref 39.0–52.0)
Hemoglobin: 13.6 g/dL (ref 13.0–17.0)
Immature Granulocytes: 1 %
Lymphocytes Relative: 22 %
Lymphs Abs: 1.9 10*3/uL (ref 0.7–4.0)
MCH: 32.2 pg (ref 26.0–34.0)
MCHC: 33.7 g/dL (ref 30.0–36.0)
MCV: 95.5 fL (ref 80.0–100.0)
Monocytes Absolute: 0.4 10*3/uL (ref 0.1–1.0)
Monocytes Relative: 5 %
Neutro Abs: 6.2 10*3/uL (ref 1.7–7.7)
Neutrophils Relative %: 70 %
Platelets: 161 10*3/uL (ref 150–400)
RBC: 4.23 MIL/uL (ref 4.22–5.81)
RDW: 14.3 % (ref 11.5–15.5)
WBC: 8.7 10*3/uL (ref 4.0–10.5)
nRBC: 0 % (ref 0.0–0.2)

## 2023-07-11 LAB — TROPONIN I (HIGH SENSITIVITY)
Troponin I (High Sensitivity): 10 ng/L (ref <18)
Troponin I (High Sensitivity): 9 ng/L (ref <18)

## 2023-07-11 LAB — I-STAT CHEM 8, ED
BUN: 13 mg/dL (ref 8–23)
Calcium, Ion: 1.12 mmol/L — ABNORMAL LOW (ref 1.15–1.40)
Chloride: 102 mmol/L (ref 98–111)
Creatinine, Ser: 0.7 mg/dL (ref 0.61–1.24)
Glucose, Bld: 91 mg/dL (ref 70–99)
HCT: 42 % (ref 39.0–52.0)
Hemoglobin: 14.3 g/dL (ref 13.0–17.0)
Potassium: 3.7 mmol/L (ref 3.5–5.1)
Sodium: 139 mmol/L (ref 135–145)
TCO2: 26 mmol/L (ref 22–32)

## 2023-07-11 LAB — MAGNESIUM: Magnesium: 2 mg/dL (ref 1.7–2.4)

## 2023-07-11 MED ORDER — KETOROLAC TROMETHAMINE 15 MG/ML IJ SOLN
15.0000 mg | Freq: Once | INTRAMUSCULAR | Status: AC
  Administered 2023-07-11: 15 mg via INTRAVENOUS
  Filled 2023-07-11: qty 1

## 2023-07-11 MED ORDER — DULOXETINE HCL 30 MG PO CPEP
30.0000 mg | ORAL_CAPSULE | Freq: Two times a day (BID) | ORAL | Status: DC
  Filled 2023-07-11: qty 1

## 2023-07-11 MED ORDER — LOSARTAN POTASSIUM 25 MG PO TABS: 25.0000 mg | ORAL_TABLET | Freq: Every day | ORAL | 0 refills | Status: DC

## 2023-07-11 MED ORDER — RIVAROXABAN 20 MG PO TABS: 20.0000 mg | ORAL_TABLET | Freq: Every day | ORAL | Status: DC

## 2023-07-11 MED ORDER — NITROGLYCERIN 2 % TD OINT
1.0000 [in_us] | TOPICAL_OINTMENT | Freq: Once | TRANSDERMAL | Status: AC
  Administered 2023-07-11: 1 [in_us] via TOPICAL
  Filled 2023-07-11: qty 1

## 2023-07-11 MED ORDER — POTASSIUM CHLORIDE CRYS ER 20 MEQ PO TBCR: 20.0000 meq | EXTENDED_RELEASE_TABLET | Freq: Once | ORAL | Status: DC

## 2023-07-11 MED ORDER — PROCHLORPERAZINE EDISYLATE 10 MG/2ML IJ SOLN
10.0000 mg | Freq: Once | INTRAMUSCULAR | Status: AC
  Administered 2023-07-11: 10 mg via INTRAVENOUS
  Filled 2023-07-11: qty 2

## 2023-07-11 MED ORDER — LOSARTAN POTASSIUM 25 MG PO TABS
25.0000 mg | ORAL_TABLET | Freq: Every day | ORAL | Status: DC
  Filled 2023-07-11: qty 1

## 2023-07-11 MED ORDER — GABAPENTIN 300 MG PO CAPS
300.0000 mg | ORAL_CAPSULE | Freq: Three times a day (TID) | ORAL | Status: DC
  Filled 2023-07-11: qty 1

## 2023-07-11 MED ORDER — OXYCODONE-ACETAMINOPHEN 5-325 MG PO TABS
2.0000 | ORAL_TABLET | Freq: Once | ORAL | Status: AC
  Administered 2023-07-11: 2 via ORAL
  Filled 2023-07-11: qty 2

## 2023-07-11 MED ORDER — DIPHENHYDRAMINE HCL 50 MG/ML IJ SOLN
12.5000 mg | Freq: Once | INTRAMUSCULAR | Status: AC
  Administered 2023-07-11: 12.5 mg via INTRAVENOUS
  Filled 2023-07-11: qty 1

## 2023-07-11 MED ORDER — CLONIDINE HCL 0.1 MG PO TABS
0.1000 mg | ORAL_TABLET | Freq: Once | ORAL | Status: AC
  Administered 2023-07-11: 0.1 mg via ORAL
  Filled 2023-07-11: qty 1

## 2023-07-11 MED ORDER — CLONIDINE HCL 0.1 MG PO TABS
0.1000 mg | ORAL_TABLET | Freq: Three times a day (TID) | ORAL | Status: DC
  Filled 2023-07-11: qty 1

## 2023-07-11 MED ORDER — RIVAROXABAN (XARELTO) VTE STARTER PACK (15 & 20 MG): ORAL_TABLET | ORAL | 0 refills | Status: DC

## 2023-07-11 MED ORDER — RIVAROXABAN 15 MG PO TABS
15.0000 mg | ORAL_TABLET | Freq: Two times a day (BID) | ORAL | Status: DC
  Filled 2023-07-11: qty 1

## 2023-07-11 MED ORDER — FLUOXETINE HCL 20 MG PO CAPS
20.0000 mg | ORAL_CAPSULE | Freq: Every day | ORAL | Status: DC
  Filled 2023-07-11: qty 1

## 2023-07-11 MED ORDER — LISINOPRIL 10 MG PO TABS
10.0000 mg | ORAL_TABLET | Freq: Once | ORAL | Status: DC
  Filled 2023-07-11: qty 1

## 2023-07-11 MED ORDER — QUETIAPINE FUMARATE 25 MG PO TABS: 25.0000 mg | ORAL_TABLET | Freq: Every day | ORAL | Status: DC

## 2023-07-11 MED ORDER — LACTATED RINGERS IV BOLUS
500.0000 mL | Freq: Once | INTRAVENOUS | Status: AC
  Administered 2023-07-11: 500 mL via INTRAVENOUS

## 2023-07-11 NOTE — Discharge Instructions (Signed)
Your ultrasound today shows you have a blood clot in your left calf.  We are starting you on Xarelto again.  Your blood pressure is also very elevated and we are putting you on a new medicine called losartan.  Do not take lisinopril with this.  Call your doctor for further outpatient treatment of your blood pressure.  Follow-up with your doctor and/or a psychiatrist for your depression.  However if you start feeling worsening depression or suicidal thoughts then return to the ER or call 911 immediately.  If you develop new or worsening headache, chest pain, shortness of breath, or any other new/concerning symptoms then return to the ER or call 911.

## 2023-07-11 NOTE — ED Provider Notes (Addendum)
DVT shows acute clot.  While these are in the calf, they are symptomatic and recurrent for him so we will put him on Xarelto (what he is to be on).  He endorses depression though no suicidality right now.  His blood pressure is still running high and he states he used to be on lisinopril-HCTZ but this causes blood pressure to go up and so his PCP told him to stop it.  However I do not think clonidine is enough so we will start him on something else like losartan as he has had leg swelling with amlodipine. No chest pain/dyspnea. Medically stable for psychiatric disposition (TTS has been consulted).   Pricilla Loveless, MD 07/11/23 [phone]  Patient is asking to leave. He continues to deny suicidal thoughts. I don't think he needs to be held here.  Headache is gone.  No chest pain.  He is hypertensive though would like a prescription for something different.  He states that he is no longer on that lisinopril or HCTZ.  Will prescribe losartan and given a Xarelto starter pack and have him follow-up with his PCP.  He will be given return precautions.   Pricilla Loveless, MD 07/11/23 1054

## 2023-07-11 NOTE — ED Notes (Signed)
Pt given a sandwich bag at this time.

## 2023-07-11 NOTE — ED Triage Notes (Signed)
Pt BIB [facility],met them with his walker in his front yard. He called EMS because he was awoken from sleep by pain in legs and feet. He was diagnosed a year ago with PVD, states leaving his shoes on and keeping his legs down alleviate pain.   Pt also had argument with EMS about bringing his walker. Pt understands it's unlikely EMS will return him home with it, says "I'll walk home."

## 2023-07-11 NOTE — ED Notes (Signed)
Pt refused to take scheduled medications. Pt states "I have not eaten in 3 days so I can't take any medicines".

## 2023-07-11 NOTE — ED Notes (Addendum)
Called [facility] for pt a ride back to his home. Also spoke with pharmacy will bring down a Xarelto card for pt at this time.

## 2023-07-11 NOTE — ED Provider Notes (Signed)
[place] EMERGENCY DEPARTMENT AT [facility] Provider Note   CSN: [id number] Arrival date & time: 07/11/23  0245     History  Chief Complaint  Patient presents with   Leg Pain    [name] [name] is a 62 y.o. male.  HPI Patient presents for pain in left knee pain.  Medical history includes HTN, COPD, neuropathy, HLD, depression, anxiety, CVA, PE.  He was previously on Xarelto but no longer takes this.  Today, he had onset of left knee pain.  He denies any associated injuries.  Pain is located primarily on the lower aspect of the anterior knee.  He also endorses generalized headache.  He takes clonidine, 0.1 mg 3 times daily for his blood pressure.  He did take his doses today.  He was previously on lisinopril/HCTZ as well but no longer takes this.  He states that it was discontinued when he changed doctors.  For his pain, he took gabapentin and snorted heroin today.    Home Medications Prior to Admission medications   Medication Sig Start Date End Date Taking? Authorizing Provider  hydrochlorothiazide (HYDRODIURIL) 12.5 MG tablet Take 1 tablet (12.5 mg total) by mouth daily. 10/11/22   Park Meo, FNP  atorvastatin (LIPITOR) 80 MG tablet TAKE 1 TABLET (80 MG TOTAL) BY MOUTH DAILY AT 6 PM. Patient taking differently: Take 80 mg by mouth daily. 03/09/16   Nani Ravens, MD  cloNIDine (CATAPRES) 0.1 MG tablet Take 0.1 mg by mouth 3 (three) times daily.    [provider]  cyclobenzaprine (FLEXERIL) 10 MG tablet Take 10 mg by mouth 3 (three) times daily as needed for muscle spasms.    [provider]  doxycycline (VIBRAMYCIN) 100 MG capsule Take 1 capsule (100 mg total) by mouth 2 (two) times daily. 06/03/23   Eber Hong, MD  DULoxetine (CYMBALTA) 30 MG capsule Take 30 mg by mouth 2 (two) times daily.    [provider]  FLUoxetine (PROZAC) 20 MG capsule Take 20 mg by mouth daily.    [provider]  gabapentin (NEURONTIN) 300 MG capsule Take  300 mg by mouth 3 (three) times daily.    [provider]  lisinopril (PRINIVIL,ZESTRIL) 40 MG tablet Take 1 tablet (40 mg total) by mouth daily. 03/09/16   Nani Ravens, MD  lisinopril-hydrochlorothiazide (ZESTORETIC) 10-12.5 MG tablet Take 1 tablet by mouth daily.    [provider]  loratadine (CLARITIN) 10 MG tablet Take 10 mg by mouth daily.    [provider]  naproxen (NAPROSYN) 500 MG tablet Take 1 tablet (500 mg total) by mouth 2 (two) times daily with a meal. 06/03/23   Eber Hong, MD  QUEtiapine (SEROQUEL) 25 MG tablet Take 25 mg by mouth at bedtime.    [provider]  rivaroxaban (XARELTO) 20 MG TABS tablet TAKE 1 TABLET BY MOUTH EVERY [place] WITH SUPPER Patient taking differently: Take 20 mg by mouth daily with supper. 03/09/16   Nani Ravens, MD      Allergies    Amlodipine, Fentanyl, and Methadone    Review of Systems   Review of Systems  Musculoskeletal:  Positive for arthralgias.  Neurological:  Positive for headaches.  All other systems reviewed and are negative.   Physical Exam Updated Vital Signs BP (!) 208/86   Pulse 61   Temp 97.9 F (36.6 C) (Oral)   Resp 14   Ht 5\' 11"  (1.803 m)   Wt 77.6 kg   SpO2  95%   BMI 23.85 kg/m  Physical Exam Vitals and nursing note reviewed.  Constitutional:      General: He is not in acute distress.    Appearance: Normal appearance. He is well-developed. He is ill-appearing (Chronically). He is not toxic-appearing or diaphoretic.  HENT:     Head: Normocephalic and atraumatic.     Right Ear: External ear normal.     Left Ear: External ear normal.     Nose: Nose normal.     Mouth/Throat:     Mouth: Mucous membranes are moist.  Eyes:     Extraocular Movements: Extraocular movements intact.     Conjunctiva/sclera: Conjunctivae normal.  Cardiovascular:     Rate and Rhythm: Normal rate and regular rhythm.  Pulmonary:     Effort: Pulmonary effort is normal. No respiratory distress.   Abdominal:     General: There is no distension.     Palpations: Abdomen is soft.  Musculoskeletal:        General: Tenderness present. No swelling or deformity.     Cervical back: Normal range of motion and neck supple.  Skin:    General: Skin is warm and dry.     Coloration: Skin is not jaundiced or pale.  Neurological:     General: No focal deficit present.     Mental Status: He is alert and oriented to person, place, and time.  Psychiatric:        Mood and Affect: Mood normal.        Behavior: Behavior normal.     ED Results / Procedures / Treatments   Labs (all labs ordered are listed, but only abnormal results are displayed) Labs Reviewed  COMPREHENSIVE METABOLIC PANEL - Abnormal; Notable for the following components:      Result Value   Potassium 3.4 (*)    Calcium 8.5 (*)    All other components within normal limits  URINALYSIS, ROUTINE W REFLEX MICROSCOPIC - Abnormal; Notable for the following components:   Color, Urine STRAW (*)    Glucose, UA 50 (*)    Hgb urine dipstick SMALL (*)    Ketones, ur 5 (*)    All other components within normal limits  I-STAT CHEM 8, ED - Abnormal; Notable for the following components:   Calcium, Ion 1.12 (*)    All other components within normal limits  CBC WITH DIFFERENTIAL/PLATELET  MAGNESIUM  TROPONIN I (HIGH SENSITIVITY)  TROPONIN I (HIGH SENSITIVITY)    EKG EKG Interpretation Date/Time:  Tuesday July 11 2023 03:58:51 EDT Ventricular Rate:  56 PR Interval:  162 QRS Duration:  104 QT Interval:  459 QTC Calculation: 443 R Axis:   -11  Text Interpretation: Sinus rhythm Probable left atrial enlargement Confirmed by Gloris Manchester ([id number]) on 07/11/2023 4:52:18 AM  Radiology DG Knee 2 Views Left  Result Date: 07/11/2023 CLINICAL DATA:  Peripheral vascular disease with left knee pain and swelling. EXAM: LEFT KNEE - 1-2 VIEW; PORTABLE CHEST - 1 VIEW COMPARISON:  Chest CT with contrast 11/22/2021. No prior left knee imaging.  FINDINGS: Chest AP portable, 4:14 a.m.: The lungs are slightly emphysematous but clear. Heart size and vasculature are normal apart from calcification in the transverse aorta. There is no substantial pleural effusion. There is osteopenia, spondylosis and bridging enthesopathy of the thoracic spine. Lower cervical ACDF plating is again partially visible. Left knee, AP and lateral only: The bone mineralization is osteopenic. No evidence of fracture, dislocation, or joint effusion. No evidence of arthropathy or acute  focal bone abnormality. Moderate enthesopathic spurring is seen of the anterior superior patella. Soft tissues are unremarkable. IMPRESSION: 1. No evidence of acute chest disease. 2. Osteopenia and degenerative change thoracic spine. 3. Aortic atherosclerosis. 4. Osteopenia with enthesopathic change of the anterior superior patella. Electronically Signed   By: Almira Bar M.D.   On: 07/11/2023 04:43   DG Chest Portable 1 View  Result Date: 07/11/2023 CLINICAL DATA:  Peripheral vascular disease with left knee pain and swelling. EXAM: LEFT KNEE - 1-2 VIEW; PORTABLE CHEST - 1 VIEW COMPARISON:  Chest CT with contrast 11/22/2021. No prior left knee imaging. FINDINGS: Chest AP portable, 4:14 a.m.: The lungs are slightly emphysematous but clear. Heart size and vasculature are normal apart from calcification in the transverse aorta. There is no substantial pleural effusion. There is osteopenia, spondylosis and bridging enthesopathy of the thoracic spine. Lower cervical ACDF plating is again partially visible. Left knee, AP and lateral only: The bone mineralization is osteopenic. No evidence of fracture, dislocation, or joint effusion. No evidence of arthropathy or acute focal bone abnormality. Moderate enthesopathic spurring is seen of the anterior superior patella. Soft tissues are unremarkable. IMPRESSION: 1. No evidence of acute chest disease. 2. Osteopenia and degenerative change thoracic spine. 3.  Aortic atherosclerosis. 4. Osteopenia with enthesopathic change of the anterior superior patella. Electronically Signed   By: Almira Bar M.D.   On: 07/11/2023 04:43   CT Head Wo Contrast  Result Date: 07/11/2023 CLINICAL DATA:  Headache with increasing frequency or severity. EXAM: CT HEAD WITHOUT CONTRAST TECHNIQUE: Contiguous axial images were obtained from the base of the skull through the vertex without intravenous contrast. RADIATION DOSE REDUCTION: This exam was performed according to the departmental dose-optimization program which includes automated exposure control, adjustment of the mA and/or kV according to patient size and/or use of iterative reconstruction technique. COMPARISON:  06/30/2023 FINDINGS: Brain: Chronic superior division left MCA territory infarct affecting the posterior insula and frontal parietal junction. No evidence of acute hemorrhage, hydrocephalus, mass, or collection. Vascular: No hyperdense vessel or unexpected calcification. Skull: Normal. Negative for fracture or focal lesion. Sinuses/Orbits: No acute finding. IMPRESSION: 1. No acute finding. 2. Remote left MCA branch infarct. Electronically Signed   By: Tiburcio Pea M.D.   On: 07/11/2023 04:18    Procedures Procedures    Medications Ordered in ED Medications  lisinopril (ZESTRIL) tablet 10 mg (10 mg Oral Patient Refused/Not Given 07/11/23 0401)  prochlorperazine (COMPAZINE) injection 10 mg (has no administration in time range)  diphenhydrAMINE (BENADRYL) injection 12.5 mg (has no administration in time range)  lactated ringers bolus 500 mL (has no administration in time range)  ketorolac (TORADOL) 15 MG/ML injection 15 mg (has no administration in time range)  cloNIDine (CATAPRES) tablet 0.1 mg (has no administration in time range)  DULoxetine (CYMBALTA) DR capsule 30 mg (has no administration in time range)  FLUoxetine (PROZAC) capsule 20 mg (has no administration in time range)  gabapentin (NEURONTIN)  capsule 300 mg (has no administration in time range)  QUEtiapine (SEROQUEL) tablet 25 mg (has no administration in time range)  oxyCODONE-acetaminophen (PERCOCET/ROXICET) 5-325 MG per tablet 2 tablet (2 tablets Oral Given 07/11/23 0401)  cloNIDine (CATAPRES) tablet 0.1 mg (0.1 mg Oral Given 07/11/23 0401)  nitroGLYCERIN (NITROGLYN) 2 % ointment 1 inch (1 inch Topical Given 07/11/23 0551)    ED Course/ Medical Decision Making/ A&P  Medical Decision Making Amount and/or Complexity of Data Reviewed Labs: ordered. Radiology: ordered.  Risk Prescription drug management.   This patient presents to the ED for concern of knee pain, this involves an extensive number of treatment options, and is a complaint that carries with it a high risk of complications and morbidity.  The differential diagnosis includes arthritis, gout, septic joint, chronic pain   Co morbidities that complicate the patient evaluation  HTN, COPD, neuropathy, HLD, depression, anxiety, CVA, PE   Additional history obtained:  Additional history obtained from N/A External records from outside source obtained and reviewed including EMR   Lab Tests:  I Ordered, and personally interpreted labs.  The pertinent results include: Normal hemoglobin, no leukocytosis, mild hypokalemia with otherwise normal electrolytes, normal kidney function, normal troponins x 2, no evidence of UTI or proteinuria   Imaging Studies ordered:  I ordered imaging studies including x-ray of chest and left knee; CT of head I independently visualized and interpreted imaging which showed no acute findings I agree with the radiologist interpretation   Cardiac Monitoring: / EKG:  The patient was maintained on a cardiac monitor.  I personally viewed and interpreted the cardiac monitored which showed an underlying rhythm of: Sinus rhythm   Problem List / ED Course / Critical interventions / Medication  management  Patient presenting for left knee pain.  On exam, his breathing is unlabored.  He has some swelling to his left lower extremity which he states is chronic.  He has tenderness to his left knee, particularly in the inferior aspect.  Knee does not appear swollen or warm to touch.  X-ray was ordered.  Patient also endorses headache.  Vital signs on arrival are notable for hypertension in the range of 220s over 150s.  He does not have any focal neurologic deficits on exam.  CT of head was ordered. Percocet was ordered for analgesia.  Patient states that he takes clonidine for his blood pressure and did take his doses today.  Additional dose was ordered.  Patient did have improved pain.  His blood pressure remained elevated.  He was previously on lisinopril and a dose of this was ordered.  He declined it.  NTG was ordered to help with his blood pressure.  Patient's lab work is reassuring.  CT head did not show acute findings.  There does not appear to be any evidence of endorgan damage in the setting of elevated blood pressure.  On reassessment, blood pressure improved to 180s SBP.  He did endorse ongoing headache and a headache cocktail was ordered.  Ace wrap was provided for his left knee.  At this point, patient endorsed severe depressive thoughts.  He did request psychiatry evaluation.  TTS consult was ordered.  Home medications were ordered.  Given his left leg pain and swelling will check for DVT as well.  Care of patient was signed to oncoming ED provider. I ordered medication including Percocet for analgesia; clonidine and NTG for hypertension; Compazine, Toradol, Benadryl, and IV fluids for headache Reevaluation of the patient after these medicines showed that the patient improved I have reviewed the patients home medicines and have made adjustments as needed   Social Determinants of Health:  Lives alone         Final Clinical Impression(s) / ED Diagnoses Final diagnoses:  Acute  pain of left knee  Hypertension, unspecified type  Depression, unspecified depression type    Rx / DC Orders ED Discharge Orders     None  Gloris Manchester, MD 07/11/23 [phone]

## 2023-07-18 DIAGNOSIS — Z7689 Persons encountering health services in other specified circumstances: Secondary | ICD-10-CM | POA: Diagnosis not present

## 2023-07-18 DIAGNOSIS — I10 Essential (primary) hypertension: Secondary | ICD-10-CM | POA: Diagnosis not present

## 2023-07-18 DIAGNOSIS — R918 Other nonspecific abnormal finding of lung field: Secondary | ICD-10-CM | POA: Diagnosis not present

## 2023-07-18 DIAGNOSIS — M5440 Lumbago with sciatica, unspecified side: Secondary | ICD-10-CM | POA: Diagnosis not present

## 2023-07-18 DIAGNOSIS — I872 Venous insufficiency (chronic) (peripheral): Secondary | ICD-10-CM | POA: Diagnosis not present

## 2023-07-18 DIAGNOSIS — M818 Other osteoporosis without current pathological fracture: Secondary | ICD-10-CM | POA: Diagnosis not present

## 2023-07-18 DIAGNOSIS — I824Z2 Acute embolism and thrombosis of unspecified deep veins of left distal lower extremity: Secondary | ICD-10-CM | POA: Diagnosis not present

## 2023-07-22 DIAGNOSIS — M5440 Lumbago with sciatica, unspecified side: Secondary | ICD-10-CM | POA: Diagnosis not present

## 2023-07-22 DIAGNOSIS — I872 Venous insufficiency (chronic) (peripheral): Secondary | ICD-10-CM | POA: Diagnosis not present

## 2023-07-22 DIAGNOSIS — M818 Other osteoporosis without current pathological fracture: Secondary | ICD-10-CM | POA: Diagnosis not present

## 2023-07-22 DIAGNOSIS — Z6827 Body mass index (BMI) 27.0-27.9, adult: Secondary | ICD-10-CM | POA: Diagnosis not present

## 2023-07-22 DIAGNOSIS — I10 Essential (primary) hypertension: Secondary | ICD-10-CM | POA: Diagnosis not present

## 2023-07-22 DIAGNOSIS — R918 Other nonspecific abnormal finding of lung field: Secondary | ICD-10-CM | POA: Diagnosis not present

## 2023-07-22 DIAGNOSIS — Z7689 Persons encountering health services in other specified circumstances: Secondary | ICD-10-CM | POA: Diagnosis not present

## 2023-07-22 DIAGNOSIS — I824Z2 Acute embolism and thrombosis of unspecified deep veins of left distal lower extremity: Secondary | ICD-10-CM | POA: Diagnosis not present

## 2023-07-22 DIAGNOSIS — M5432 Sciatica, left side: Secondary | ICD-10-CM | POA: Diagnosis not present

## 2023-07-22 DIAGNOSIS — Z79899 Other long term (current) drug therapy: Secondary | ICD-10-CM | POA: Diagnosis not present

## 2023-07-22 DIAGNOSIS — R030 Elevated blood-pressure reading, without diagnosis of hypertension: Secondary | ICD-10-CM | POA: Diagnosis not present

## 2023-07-25 LAB — LAB REPORT - SCANNED
A1c: 5.2
EGFR: 129
HM HIV Screening: NEGATIVE

## 2023-07-26 DIAGNOSIS — Z79899 Other long term (current) drug therapy: Secondary | ICD-10-CM | POA: Diagnosis not present

## 2023-08-08 DIAGNOSIS — Z7689 Persons encountering health services in other specified circumstances: Secondary | ICD-10-CM | POA: Diagnosis not present

## 2023-08-08 DIAGNOSIS — Z419 Encounter for procedure for purposes other than remedying health state, unspecified: Secondary | ICD-10-CM | POA: Diagnosis not present

## 2023-08-09 DIAGNOSIS — I824Z2 Acute embolism and thrombosis of unspecified deep veins of left distal lower extremity: Secondary | ICD-10-CM | POA: Diagnosis not present

## 2023-08-09 DIAGNOSIS — I169 Hypertensive crisis, unspecified: Secondary | ICD-10-CM | POA: Diagnosis not present

## 2023-08-09 DIAGNOSIS — I10 Essential (primary) hypertension: Secondary | ICD-10-CM | POA: Diagnosis not present

## 2023-08-09 DIAGNOSIS — M5440 Lumbago with sciatica, unspecified side: Secondary | ICD-10-CM | POA: Diagnosis not present

## 2023-08-09 DIAGNOSIS — Z7689 Persons encountering health services in other specified circumstances: Secondary | ICD-10-CM | POA: Diagnosis not present

## 2023-08-09 DIAGNOSIS — F5101 Primary insomnia: Secondary | ICD-10-CM | POA: Diagnosis not present

## 2023-08-30 DIAGNOSIS — Z7689 Persons encountering health services in other specified circumstances: Secondary | ICD-10-CM | POA: Diagnosis not present

## 2023-09-04 ENCOUNTER — Encounter: Payer: Self-pay | Admitting: Family Medicine

## 2023-09-04 ENCOUNTER — Ambulatory Visit (INDEPENDENT_AMBULATORY_CARE_PROVIDER_SITE_OTHER): Payer: Medicaid Other | Admitting: Family Medicine

## 2023-09-04 ENCOUNTER — Telehealth: Payer: Self-pay

## 2023-09-04 VITALS — BP 190/100 | HR 67 | Temp 97.5°F | Ht 71.0 in | Wt 209.1 lb

## 2023-09-04 DIAGNOSIS — F1990 Other psychoactive substance use, unspecified, uncomplicated: Secondary | ICD-10-CM | POA: Diagnosis not present

## 2023-09-04 DIAGNOSIS — G894 Chronic pain syndrome: Secondary | ICD-10-CM

## 2023-09-04 DIAGNOSIS — I10 Essential (primary) hypertension: Secondary | ICD-10-CM

## 2023-09-04 DIAGNOSIS — Z7689 Persons encountering health services in other specified circumstances: Secondary | ICD-10-CM | POA: Diagnosis not present

## 2023-09-04 NOTE — Assessment & Plan Note (Signed)
Chronic. BP elevated 190/100 today. He denies symptoms and declines an EKG. Discussed the risk of stroke and heart attack with BP uncontrolled this high. Has been being managed at [facility] and medications have changed since his prior visit. He also admits to not taking his medications today and using heroin prior to his visit. Counseled on importance of medication compliance. Encouraged to seek assistance at [facility] services for his opioid use and [facility]. I have requested he release medical records from [facility] and encouraged he return to my office substance free and having taken his BP medications in 1 week or return to his current PCP at [facility]. RAvoid tobacco products. Avoid excess alcohol. Take medications as prescribed and bring medications and blood pressure log with cuff to each office visit. Seek medical care for chest pain, palpitations, shortness of breath with exertion, dizziness/lightheadedness, vision changes, recurrent headaches, or swelling of extremities.

## 2023-09-04 NOTE — Assessment & Plan Note (Signed)
Recommended [facility] recovery services and contact information provided. Counseled on risks of substance use.

## 2023-09-04 NOTE — Progress Notes (Signed)
Subjective:  HPI: [name] [name] is a 62 y.o. male presenting on 09/04/2023 for Follow-up (Referral for back pain pinch/swollen leg/etc. )   HPI Patient is in today for referral to pain clinic and addiction specialist. He reports currently self medicating for his low back pain with heroin and fentanyl and would like help with this. Has been seen at [facility] 1 month ago but was not seen because he could not pay the copay, he has established care with [facility] medical in the last year since initially establishing with me as he was interested in Suboxone treatment. He has a history of chronic back pain with sciatic nerve pain, has undergone a cervical spinal surgery and lumbar surgery was recommended. He reports using Heroin as recently as this morning and is not compliant with blood pressure medications. He denies chest pain, palpitations, shortness of breath, vision changes, recurrent headache, lightheadedness or dizziness. He is not monitoring his blood pressure at home.   Review of Systems  All other systems reviewed and are negative.   Relevant past medical history reviewed and updated as indicated.   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   Current Outpatient Medications:    cloNIDine (CATAPRES) 0.1 MG tablet, Take 0.1 mg by mouth 3  (three) times daily., Disp: , Rfl:    DULoxetine (CYMBALTA) 30 MG capsule, Take 30 mg by mouth 2 (two) times daily., Disp: , Rfl:    gabapentin (NEURONTIN) 300 MG capsule, Take 300 mg by mouth 3 (three) times daily., Disp: , Rfl:    loratadine (CLARITIN) 10 MG tablet, Take 10 mg by mouth daily., Disp: , Rfl:    RIVAROXABAN (XARELTO) VTE STARTER PACK (15 & 20 MG), Follow package directions: Take one 15mg  tablet by mouth twice a [place]. On [place] [address], switch to one 20mg  tablet once a [place]. Take with food., Disp: 51 each, Rfl: 0  Allergies  Allergen Reactions   Amlodipine Swelling    BLE peripheral edema    Fentanyl     Per pt he had a near overdose on these due to "regulating body temp"   Methadone Itching    Objective:   BP (!) 190/100   Pulse 67   Temp (!) 97.5 F (36.4 C) (Oral)   Ht 5\' 11"  (1.803 m)   Wt 209 lb 1.6 oz (94.8 kg)   SpO2 99%   BMI 29.16 kg/m      09/04/2023   11:32 AM 09/04/2023   11:27 AM 07/11/2023    8:00 AM  Vitals with BMI  Height  5\' 11"    Weight  209 lbs 2 oz   BMI  29.18   Systolic 190 192 324  Diastolic 100 100 92  Pulse  67 61     Physical Exam Vitals and nursing note reviewed.  Constitutional:      Appearance: Normal appearance. He is normal weight.  HENT:     Head: Normocephalic and atraumatic.  Cardiovascular:     Rate and Rhythm: Normal rate and regular rhythm.     Pulses: Normal pulses.     Heart sounds: Normal heart sounds.  Pulmonary:     Effort: Pulmonary effort is normal.     Breath sounds: Normal breath sounds.  Skin:    General: Skin is warm and dry.     Capillary Refill: Capillary refill takes less than 2 seconds.  Neurological:     General: No focal deficit present.     Mental Status: He is alert and oriented to person, place, and time. Mental status is at baseline.  Psychiatric:        Mood and Affect: Mood normal.        Behavior: Behavior normal.        Thought Content: Thought content normal.        Judgment: Judgment  normal.     Assessment & Plan:  Chronic pain syndrome Assessment & Plan: Will refer to [facility] for low back, knee, and nerve pain.   Primary hypertension Assessment & Plan: Chronic. BP elevated 190/100 today. He denies symptoms and declines an EKG. Discussed the risk of stroke and heart attack with BP uncontrolled this high. Has been being managed at [facility] and medications have changed since his prior visit. He also admits to not taking his medications today and using heroin prior to his visit. Counseled on importance of medication compliance. Encouraged to seek assistance at [facility] services for his opioid use and [facility]. I have requested he release medical records from [facility] and encouraged he return to my office substance free and having taken his BP medications in 1 week or return to his current PCP at [facility]. RAvoid tobacco products. Avoid excess alcohol. Take medications as prescribed and bring medications and blood pressure log with cuff to each office visit. Seek medical care for chest pain, palpitations, shortness of breath with exertion, dizziness/lightheadedness, vision changes, recurrent headaches, or swelling of extremities.    Substance use disorder Assessment & Plan: Recommended [facility] recovery services and contact information provided. Counseled on risks of substance use.       Follow up plan: Return in about 2 weeks (around 09/18/2023) for hypertension.  Park Meo, FNP

## 2023-09-04 NOTE — Telephone Encounter (Signed)
Referral to [facility] fax per NP

## 2023-09-04 NOTE — Assessment & Plan Note (Signed)
Will refer to [facility] for low back, knee, and nerve pain.

## 2023-09-08 ENCOUNTER — Encounter: Payer: Self-pay | Admitting: Family Medicine

## 2023-09-08 DIAGNOSIS — Z419 Encounter for procedure for purposes other than remedying health state, unspecified: Secondary | ICD-10-CM | POA: Diagnosis not present

## 2023-09-12 ENCOUNTER — Encounter: Payer: Self-pay | Admitting: Family Medicine

## 2023-09-12 ENCOUNTER — Ambulatory Visit (INDEPENDENT_AMBULATORY_CARE_PROVIDER_SITE_OTHER): Payer: Medicaid Other | Admitting: Family Medicine

## 2023-09-12 VITALS — BP 182/90 | HR 54 | Temp 98.6°F | Ht 71.0 in | Wt 206.0 lb

## 2023-09-12 DIAGNOSIS — Z7689 Persons encountering health services in other specified circumstances: Secondary | ICD-10-CM | POA: Diagnosis not present

## 2023-09-12 DIAGNOSIS — I82452 Acute embolism and thrombosis of left peroneal vein: Secondary | ICD-10-CM | POA: Insufficient documentation

## 2023-09-12 DIAGNOSIS — R600 Localized edema: Secondary | ICD-10-CM | POA: Diagnosis not present

## 2023-09-12 DIAGNOSIS — I10 Essential (primary) hypertension: Secondary | ICD-10-CM | POA: Diagnosis not present

## 2023-09-12 DIAGNOSIS — Z23 Encounter for immunization: Secondary | ICD-10-CM

## 2023-09-12 HISTORY — DX: Acute embolism and thrombosis of left peroneal vein: I82.452

## 2023-09-12 NOTE — Progress Notes (Signed)
Subjective:  HPI: [name] [name] is a 62 y.o. male presenting on 09/12/2023 for Follow-up (Left leg has some sores that are leaking/)   HPI Patient is in today for blood pressure follow up and concerns for worsening swelling of his left leg. 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 He is taking Xarelto 20mg  daily and reports compliance. He denies shortness of breath, palpitations, chest pain. His blood pressure also remains elevated today. He did bring his medication with him and seems to be compliant with Clonidine 0.1mg  TID and Losartan 100mg  daily. He denies using stimulant drugs for 3 days. Denies chest pain, palpations, vision changes, recurrent headaches, shortness of breath, lightheadedness, dizziness. Is not montioring his BP at home.   HYPERTENSION without Chronic Kidney Disease Hypertension status: uncontrolled  Satisfied with current treatment? no Duration of hypertension: chronic BP monitoring frequency:  not checking BP range:  BP medication side effects:  no Medication compliance: good compliance Previous BP meds:amlodipine, clonidine, and losartan (cozaar) Aspirin: no Recurrent headaches: no Visual changes: no Palpitations: no Dyspnea: no Chest pain: no Lower extremity edema: yes Dizzy/lightheaded: no   Review of Systems  All other systems reviewed and are negative.   Relevant past medical history reviewed and updated as indicated.   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   Current Outpatient Medications:    cloNIDine (CATAPRES) 0.1 MG tablet, Take 0.1 mg by mouth 3 (three) times daily., Disp: , Rfl:    DULoxetine (CYMBALTA) 30 MG capsule, Take 30 mg by mouth 2 (two) times daily., Disp: , Rfl:    gabapentin (NEURONTIN) 300 MG capsule, Take 300 mg by mouth 3 (three) times daily., Disp: , Rfl:    loratadine (CLARITIN) 10 MG tablet, Take 10 mg by mouth daily., Disp: , Rfl:    losartan (COZAAR) 100 MG tablet, Take 100 mg by mouth daily., Disp: , Rfl:    rivaroxaban (XARELTO) 20 MG TABS tablet, Take 20 mg by mouth daily with supper., Disp: , Rfl:   Allergies  Allergen Reactions   Amlodipine Swelling    BLE peripheral edema    Fentanyl     Per pt he had a near overdose on these due to "regulating body temp"   Methadone Itching    Objective:   BP (!) 182/90   Pulse (!) 54   Temp 98.6 F (37 C) (Oral)   Ht 5\' 11"  (1.803 m)   Wt 206 lb (93.4 kg)   SpO2 99%   BMI 28.73 kg/m      09/12/2023    2:17 PM  09/12/2023    2:13 PM 09/04/2023   11:32 AM  Vitals with BMI  Height  5\' 11"    Weight  206 lbs   BMI  28.74   Systolic 182 190 161  Diastolic 90 92 100  Pulse  54      Physical Exam Vitals and nursing note reviewed.  Constitutional:      Appearance: Normal appearance. He is normal weight.  HENT:     Head: Normocephalic and atraumatic.  Cardiovascular:     Rate and Rhythm: Normal rate and regular rhythm.     Pulses: Normal pulses.     Heart sounds: Normal heart sounds.  Pulmonary:     Effort: Pulmonary effort is normal.     Breath sounds: Normal breath sounds.  Musculoskeletal:     Right lower leg: 2+ Edema present.     Left lower leg: Edema present.  Skin:    General: Skin is warm and dry.     Capillary Refill:  Capillary refill takes less than 2 seconds.     Findings: Erythema (LLE) present.  Neurological:     General: No focal deficit present.     Mental Status: He is alert and oriented to person, place, and time. Mental status is at baseline.  Psychiatric:        Mood and Affect: Mood normal.        Behavior: Behavior normal.        Thought Content: Thought content normal.        Judgment: Judgment normal.     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 Worsening swelling and redness. Will refer to vein and vascular.  Orders: -     Ambulatory referral to Vascular Surgery  Primary hypertension Assessment & Plan: Uncontrolled. He reports being substance free for 3 days now. Compliant with Clonidine and Losartan. Does not tolerate Amlodipine due to swelling and declined diuretic due to urinary frequency. Will obtain CMP today and place urgent referral to HTN clinic. HR is in 50s today, unsure of exactly which substances he uses other than heroin, will confirm history of cocaine use, would consider Bystolic. Recommend heart healthy diet such as Mediterranean diet with whole grains, fruits, vegetable, fish, lean meats, nuts, and olive oil. Limit salt. Encouraged moderate walking, 3-5 times/week for 30-50 minutes each session. Aim for at least 150 minutes.week. Goal should be pace of 3 miles/hours, or walking 1.5 miles in 30 minutes. Avoid tobacco products. Avoid excess alcohol. Take medications as prescribed and bring medications and blood pressure log with cuff to each office visit. Seek medical care for chest pain, palpitations, shortness of breath with exertion, dizziness/lightheadedness, vision changes, recurrent headaches, or swelling of extremities.   Orders: -     COMPLETE METABOLIC PANEL WITH GFR -     Ambulatory  referral to [facility]  Need for vaccination -     Flu vaccine trivalent PF, 6mos and older(Flulaval,Afluria,Fluarix,Fluzone)     Follow up plan: Return in about 1 week (around 09/19/2023) for hypertension.  Park Meo, FNP

## 2023-09-12 NOTE — Assessment & Plan Note (Signed)
Hx of known DVT on Xarelto. Worsening swelling and redness. Will refer to vein and vascular.

## 2023-09-12 NOTE — Assessment & Plan Note (Addendum)
Uncontrolled. He reports being substance free for 3 days now. Compliant with Clonidine and Losartan. Does not tolerate Amlodipine due to swelling and declined diuretic due to urinary frequency. Will obtain CMP today and place urgent referral to HTN clinic. HR is in 50s today, unsure of exactly which substances he uses other than heroin, will confirm history of cocaine use, would consider Bystolic. Recommend heart healthy diet such as Mediterranean diet with whole grains, fruits, vegetable, fish, lean meats, nuts, and olive oil. Limit salt. Encouraged moderate walking, 3-5 times/week for 30-50 minutes each session. Aim for at least 150 minutes.week. Goal should be pace of 3 miles/hours, or walking 1.5 miles in 30 minutes. Avoid tobacco products. Avoid excess alcohol. Take medications as prescribed and bring medications and blood pressure log with cuff to each office visit. Seek medical care for chest pain, palpitations, shortness of breath with exertion, dizziness/lightheadedness, vision changes, recurrent headaches, or swelling of extremities.

## 2023-09-13 ENCOUNTER — Telehealth: Payer: Self-pay | Admitting: Family Medicine

## 2023-09-13 ENCOUNTER — Telehealth: Payer: Self-pay

## 2023-09-13 ENCOUNTER — Other Ambulatory Visit: Payer: Self-pay | Admitting: Family Medicine

## 2023-09-13 ENCOUNTER — Encounter: Payer: Self-pay | Admitting: Family Medicine

## 2023-09-13 LAB — COMPLETE METABOLIC PANEL WITH GFR
AG Ratio: 1.6 (calc) (ref 1.0–2.5)
ALT: 9 U/L (ref 9–46)
AST: 13 U/L (ref 10–35)
Albumin: 3.9 g/dL (ref 3.6–5.1)
Alkaline phosphatase (APISO): 75 U/L (ref 35–144)
BUN: 19 mg/dL (ref 7–25)
CO2: 29 mmol/L (ref 20–32)
Calcium: 9 mg/dL (ref 8.6–10.3)
Chloride: 103 mmol/L (ref 98–110)
Creat: 0.72 mg/dL (ref 0.70–1.35)
Globulin: 2.5 g/dL (ref 1.9–3.7)
Glucose, Bld: 96 mg/dL (ref 65–99)
Potassium: 4.6 mmol/L (ref 3.5–5.3)
Sodium: 139 mmol/L (ref 135–146)
Total Bilirubin: 0.4 mg/dL (ref 0.2–1.2)
Total Protein: 6.4 g/dL (ref 6.1–8.1)
eGFR: 104 mL/min/{1.73_m2} (ref >=60)

## 2023-09-13 MED ORDER — NEBIVOLOL HCL 5 MG PO TABS: 5.0000 mg | ORAL_TABLET | Freq: Every day | ORAL | 3 refills | Status: DC

## 2023-09-13 NOTE — Telephone Encounter (Signed)
Spoke w/pt and per pt, there is no other illicit drugs but the heroin and fentanly. Per pt only used that drugs to help w/pain. Pt aware that Bistolic will be added.

## 2023-09-13 NOTE — Telephone Encounter (Signed)
Copied from CRM [id number]. Topic: Clinical - Prescription Issue >> Sep 13, 2023  1:17 PM Dennison Nancy wrote: Reason for CRM: patient callback that the pharmacy do not have the new  medication that [facility] prescribed . Callback [phone]

## 2023-09-13 NOTE — Telephone Encounter (Signed)
Called patient to discuss plan of care. Labs were normal and I would like to add Bistolic, need to confirm no illicit drug use other than heroin and fentanyl.

## 2023-09-18 ENCOUNTER — Ambulatory Visit: Payer: Medicaid Other | Admitting: Family Medicine

## 2023-09-18 ENCOUNTER — Other Ambulatory Visit: Payer: Self-pay

## 2023-09-18 MED ORDER — GABAPENTIN 300 MG PO CAPS: 300.0000 mg | ORAL_CAPSULE | Freq: Three times a day (TID) | ORAL | 30 refills | Status: DC

## 2023-09-22 ENCOUNTER — Inpatient Hospital Stay: Admission: RE | Admit: 2023-09-22 | Payer: Medicaid Other | Source: Ambulatory Visit

## 2023-09-22 DIAGNOSIS — Z7689 Persons encountering health services in other specified circumstances: Secondary | ICD-10-CM | POA: Diagnosis not present

## 2023-09-25 ENCOUNTER — Ambulatory Visit: Payer: Medicaid Other | Admitting: Family Medicine

## 2023-09-27 DIAGNOSIS — Z7689 Persons encountering health services in other specified circumstances: Secondary | ICD-10-CM | POA: Diagnosis not present

## 2023-10-02 DIAGNOSIS — Z7689 Persons encountering health services in other specified circumstances: Secondary | ICD-10-CM | POA: Diagnosis not present

## 2023-10-03 ENCOUNTER — Ambulatory Visit: Payer: Medicaid Other

## 2023-10-04 DIAGNOSIS — Z7689 Persons encountering health services in other specified circumstances: Secondary | ICD-10-CM | POA: Diagnosis not present

## 2023-10-05 DIAGNOSIS — Z7689 Persons encountering health services in other specified circumstances: Secondary | ICD-10-CM | POA: Diagnosis not present

## 2023-10-06 DIAGNOSIS — Z7689 Persons encountering health services in other specified circumstances: Secondary | ICD-10-CM | POA: Diagnosis not present

## 2023-10-07 DIAGNOSIS — Z7689 Persons encountering health services in other specified circumstances: Secondary | ICD-10-CM | POA: Diagnosis not present

## 2023-10-08 DIAGNOSIS — Z419 Encounter for procedure for purposes other than remedying health state, unspecified: Secondary | ICD-10-CM | POA: Diagnosis not present

## 2023-10-09 DIAGNOSIS — Z7689 Persons encountering health services in other specified circumstances: Secondary | ICD-10-CM | POA: Diagnosis not present

## 2023-10-13 DIAGNOSIS — Z7689 Persons encountering health services in other specified circumstances: Secondary | ICD-10-CM | POA: Diagnosis not present

## 2023-10-18 DIAGNOSIS — Z7689 Persons encountering health services in other specified circumstances: Secondary | ICD-10-CM | POA: Diagnosis not present

## 2023-10-19 DIAGNOSIS — Z7689 Persons encountering health services in other specified circumstances: Secondary | ICD-10-CM | POA: Diagnosis not present

## 2023-10-20 DIAGNOSIS — Z7689 Persons encountering health services in other specified circumstances: Secondary | ICD-10-CM | POA: Diagnosis not present

## 2023-10-21 DIAGNOSIS — Z7689 Persons encountering health services in other specified circumstances: Secondary | ICD-10-CM | POA: Diagnosis not present

## 2023-10-23 DIAGNOSIS — Z7689 Persons encountering health services in other specified circumstances: Secondary | ICD-10-CM | POA: Diagnosis not present

## 2023-10-24 DIAGNOSIS — Z7689 Persons encountering health services in other specified circumstances: Secondary | ICD-10-CM | POA: Diagnosis not present

## 2023-10-25 DIAGNOSIS — Z7689 Persons encountering health services in other specified circumstances: Secondary | ICD-10-CM | POA: Diagnosis not present

## 2023-10-26 DIAGNOSIS — Z7689 Persons encountering health services in other specified circumstances: Secondary | ICD-10-CM | POA: Diagnosis not present

## 2023-10-27 DIAGNOSIS — Z7689 Persons encountering health services in other specified circumstances: Secondary | ICD-10-CM | POA: Diagnosis not present

## 2023-10-28 DIAGNOSIS — Z7689 Persons encountering health services in other specified circumstances: Secondary | ICD-10-CM | POA: Diagnosis not present

## 2023-10-30 DIAGNOSIS — Z7689 Persons encountering health services in other specified circumstances: Secondary | ICD-10-CM | POA: Diagnosis not present

## 2023-10-31 DIAGNOSIS — Z7689 Persons encountering health services in other specified circumstances: Secondary | ICD-10-CM | POA: Diagnosis not present

## 2023-11-02 DIAGNOSIS — Z7689 Persons encountering health services in other specified circumstances: Secondary | ICD-10-CM | POA: Diagnosis not present

## 2023-11-03 DIAGNOSIS — Z7689 Persons encountering health services in other specified circumstances: Secondary | ICD-10-CM | POA: Diagnosis not present

## 2023-11-04 DIAGNOSIS — Z7689 Persons encountering health services in other specified circumstances: Secondary | ICD-10-CM | POA: Diagnosis not present

## 2023-11-06 DIAGNOSIS — Z7689 Persons encountering health services in other specified circumstances: Secondary | ICD-10-CM | POA: Diagnosis not present

## 2023-11-07 DIAGNOSIS — Z7689 Persons encountering health services in other specified circumstances: Secondary | ICD-10-CM | POA: Diagnosis not present

## 2023-11-08 DIAGNOSIS — Z419 Encounter for procedure for purposes other than remedying health state, unspecified: Secondary | ICD-10-CM | POA: Diagnosis not present

## 2023-11-09 DIAGNOSIS — Z7689 Persons encountering health services in other specified circumstances: Secondary | ICD-10-CM | POA: Diagnosis not present

## 2023-11-10 DIAGNOSIS — Z7689 Persons encountering health services in other specified circumstances: Secondary | ICD-10-CM | POA: Diagnosis not present

## 2023-11-11 DIAGNOSIS — Z7689 Persons encountering health services in other specified circumstances: Secondary | ICD-10-CM | POA: Diagnosis not present

## 2023-11-13 DIAGNOSIS — Z7689 Persons encountering health services in other specified circumstances: Secondary | ICD-10-CM | POA: Diagnosis not present

## 2023-11-14 DIAGNOSIS — Z7689 Persons encountering health services in other specified circumstances: Secondary | ICD-10-CM | POA: Diagnosis not present

## 2023-11-15 DIAGNOSIS — Z7689 Persons encountering health services in other specified circumstances: Secondary | ICD-10-CM | POA: Diagnosis not present

## 2023-11-16 DIAGNOSIS — Z7689 Persons encountering health services in other specified circumstances: Secondary | ICD-10-CM | POA: Diagnosis not present

## 2023-11-21 DIAGNOSIS — Z7689 Persons encountering health services in other specified circumstances: Secondary | ICD-10-CM | POA: Diagnosis not present

## 2023-11-22 DIAGNOSIS — Z7689 Persons encountering health services in other specified circumstances: Secondary | ICD-10-CM | POA: Diagnosis not present

## 2023-11-23 DIAGNOSIS — Z7689 Persons encountering health services in other specified circumstances: Secondary | ICD-10-CM | POA: Diagnosis not present

## 2023-11-24 DIAGNOSIS — Z7689 Persons encountering health services in other specified circumstances: Secondary | ICD-10-CM | POA: Diagnosis not present

## 2023-11-25 DIAGNOSIS — Z7689 Persons encountering health services in other specified circumstances: Secondary | ICD-10-CM | POA: Diagnosis not present

## 2023-11-28 DIAGNOSIS — Z7689 Persons encountering health services in other specified circumstances: Secondary | ICD-10-CM | POA: Diagnosis not present

## 2023-11-29 DIAGNOSIS — Z7689 Persons encountering health services in other specified circumstances: Secondary | ICD-10-CM | POA: Diagnosis not present

## 2023-11-30 ENCOUNTER — Ambulatory Visit: Payer: Medicaid Other | Admitting: Family Medicine

## 2023-11-30 ENCOUNTER — Institutional Professional Consult (permissible substitution) (HOSPITAL_BASED_OUTPATIENT_CLINIC_OR_DEPARTMENT_OTHER): Payer: Medicaid Other | Admitting: Family

## 2023-11-30 DIAGNOSIS — Z7689 Persons encountering health services in other specified circumstances: Secondary | ICD-10-CM | POA: Diagnosis not present

## 2023-12-01 DIAGNOSIS — Z7689 Persons encountering health services in other specified circumstances: Secondary | ICD-10-CM | POA: Diagnosis not present

## 2023-12-02 DIAGNOSIS — Z7689 Persons encountering health services in other specified circumstances: Secondary | ICD-10-CM | POA: Diagnosis not present

## 2023-12-04 DIAGNOSIS — Z7689 Persons encountering health services in other specified circumstances: Secondary | ICD-10-CM | POA: Diagnosis not present

## 2023-12-05 DIAGNOSIS — Z7689 Persons encountering health services in other specified circumstances: Secondary | ICD-10-CM | POA: Diagnosis not present

## 2023-12-06 DIAGNOSIS — Z7689 Persons encountering health services in other specified circumstances: Secondary | ICD-10-CM | POA: Diagnosis not present

## 2023-12-07 DIAGNOSIS — Z7689 Persons encountering health services in other specified circumstances: Secondary | ICD-10-CM | POA: Diagnosis not present

## 2023-12-08 DIAGNOSIS — Z7689 Persons encountering health services in other specified circumstances: Secondary | ICD-10-CM | POA: Diagnosis not present

## 2023-12-09 DIAGNOSIS — Z419 Encounter for procedure for purposes other than remedying health state, unspecified: Secondary | ICD-10-CM | POA: Diagnosis not present

## 2023-12-12 DIAGNOSIS — Z7689 Persons encountering health services in other specified circumstances: Secondary | ICD-10-CM | POA: Diagnosis not present

## 2023-12-13 DIAGNOSIS — Z7689 Persons encountering health services in other specified circumstances: Secondary | ICD-10-CM | POA: Diagnosis not present

## 2023-12-14 DIAGNOSIS — Z7689 Persons encountering health services in other specified circumstances: Secondary | ICD-10-CM | POA: Diagnosis not present

## 2023-12-15 DIAGNOSIS — Z7689 Persons encountering health services in other specified circumstances: Secondary | ICD-10-CM | POA: Diagnosis not present

## 2023-12-16 DIAGNOSIS — Z7689 Persons encountering health services in other specified circumstances: Secondary | ICD-10-CM | POA: Diagnosis not present

## 2023-12-25 ENCOUNTER — Other Ambulatory Visit: Payer: Self-pay | Admitting: Family Medicine

## 2023-12-26 DIAGNOSIS — Z7689 Persons encountering health services in other specified circumstances: Secondary | ICD-10-CM | POA: Diagnosis not present

## 2023-12-27 ENCOUNTER — Ambulatory Visit: Payer: Medicaid Other | Admitting: Family Medicine

## 2023-12-28 ENCOUNTER — Institutional Professional Consult (permissible substitution) (HOSPITAL_BASED_OUTPATIENT_CLINIC_OR_DEPARTMENT_OTHER): Payer: Medicaid Other | Admitting: Family

## 2023-12-28 NOTE — Progress Notes (Deleted)
 Advanced Hypertension Clinic Initial Assessment:    Date:  12/28/2023   ID:  [name] [name], DOB [birth year], MRN [id number]  PCP:  Park Meo, FNP  Cardiologist:  None  Nephrologist:  Referring MD: Park Meo, FNP   CC: Hypertension  History of Present Illness:    [name] [name] is a 63 y.o. male with a hx of HTN, DVT, substance use (heroin, fentanyl) here to establish care in the [facility]pertension Clinic.   [name] [name] was diagnosed with hypertension ***. It has been {Blank single:19197::"easy","difficult","***"} to control. Blood pressure {Blank single:19197::"not checked routinely at home","checked with arm cuff at home","checked with wrist cuff at home","***"}. Readings have been ***. he reports tobacco use ***. Alcohol use ***. For exercise he ***. he eats {Blank multiple:19196::"***","at home","outside of the home"} and {Blank single:19197::"does","does not"} follow low sodium diet.   Previous antihypertensives:  Secondary Causes of Hypertension  Medications/Herbal: OCP, steroids, stimulants, antidepressants, weight loss medication, immune suppressants, NSAIDs, sympathomimetics, alcohol, caffeine, licorice, ginseng, St. John's wort, chemo  Sleep Apnea Renal artery stenosis Hyperaldosteronism Hyper/hypothyroidism Pheochromocytoma: palpitations, tachycardia, headache, diaphoresis (plasma metanephrines) Cushing's syndrome: Cushingoid facies, central obesity, proximal muscle weakness, and ecchymoses, adrenal incidentaloma (cortisol) Coarctation of the aorta  Past Medical History:  Diagnosis Date   Allergy    Anxiety    Asthma    Cancer (HCC)    CHF (congestive heart failure) (HCC)    Constipation due to pain medication    COPD (chronic obstructive pulmonary disease) (HCC)    CVA (cerebral infarction) 09/18/2015   Depression    Emphysema of lung (HCC)    Hyperlipidemia    Hypertension    Migraine    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     Allergies:   Amlodipine, Fentanyl, and Methadone   Social History   Socioeconomic History   Marital status: Divorced    Spouse name: Not on file   Number of children: Not on file   Years of education: Not on file   Highest education level: Not on file  Occupational History   Not on file  Tobacco Use   Smoking status: Every [place]    Current packs/[place]: 1.50    Types: Cigarettes   Smokeless tobacco: Never   Tobacco comments:    increased to 2ppd  Vaping Use   Vaping status: Never Used  Substance and Sexual Activity   Alcohol use: No    Alcohol/week: 0.0 standard drinks of alcohol    Comment: former   Drug use: Yes    Comment: Heroin-just used 3 days ago   Sexual activity: Yes  Other Topics Concern   Not on file  Social History Narrative   Not on file   Social Drivers of Health   Financial Resource Strain: High Risk (06/10/2020)   Received from [facility], [facility]   Overall Financial Resource Strain (CARDIA)    Difficulty of Paying Living Expenses: Hard  Food Insecurity: Food Insecurity Present (05/21/2021)   Received from [facility]  Hunger Vital Sign    Worried About Running Out of Food in the Last Year: Often true    Ran Out of Food in the Last Year: Sometimes true  Transportation Needs: Unmet Transportation Needs (06/10/2020)   Received from [facility], [facility]   PRAPARE - Transportation    Lack of Transportation (Medical): Yes    Lack of Transportation (Non-Medical): Yes  Physical Activity: Insufficiently Active (06/10/2020)   Received from [facility]   Exercise Vital Sign    Days of Exercise per Week: 2 days    Minutes of Exercise per Session: 10 min  Stress: Stress Concern Present (06/10/2020)   Received from [facility], [facility] - Occupational Stress Questionnaire    Feeling of Stress : Very much  Social Connections: Unknown (03/12/2022)   Received from [facility], [facility]   Social Network    Social Network: Not on file     Family History: The patient's ***family history includes Cancer in his mother; Depression in his sister.  ROS:   Please see the history of present illness.    *** All other systems reviewed and are negative.  EKGs/Labs/Other Studies Reviewed:         Recent Labs: 07/11/2023: Hemoglobin 14.3; Magnesium 2.0; Platelets 161 09/12/2023: ALT 9; BUN 19; Creat 0.72; Potassium 4.6; Sodium 139   Recent Lipid Panel    Component Value Date/Time   CHOL 139 10/05/2022 0934   TRIG 87 10/05/2022 0934   HDL 46 10/05/2022 0934   CHOLHDL 3.0 10/05/2022 0934   VLDL 24 09/19/2015 0250   LDLCALC 76 10/05/2022 0934   LDLDIRECT 75 01/14/2013 0907    Physical Exam:   VS:  There were no vitals taken for this visit. , BMI There is no height or weight on file to calculate BMI. GENERAL:  Well appearing HEENT: Pupils equal round and reactive, fundi not visualized, oral mucosa unremarkable NECK:  No jugular venous distention, waveform within normal limits, carotid upstroke brisk and symmetric, no bruits, no thyromegaly LYMPHATICS:  No cervical adenopathy LUNGS:  Clear to auscultation bilaterally HEART:  RRR.  PMI not displaced or sustained,S1 and S2 within normal limits, no S3, no S4, no clicks, no rubs, *** murmurs ABD:  Flat, positive bowel sounds normal in frequency in pitch, no bruits, no rebound, no guarding, no midline pulsatile mass, no hepatomegaly, no splenomegaly EXT:  2 plus pulses throughout, no edema, no cyanosis no clubbing SKIN:  No rashes  no nodules NEURO:  Cranial nerves II through XII grossly intact, motor grossly intact throughout PSYCH:  Cognitively intact, oriented to person place and time   ASSESSMENT/PLAN:    HTN -    Screening for Secondary Hypertension: { Click here to document screening for secondary causes of HTN  :[id number]}    Relevant Labs/Studies:    Latest Ref Rng & Units 09/12/2023    2:48 PM 07/11/2023    4:10 AM 07/11/2023    3:53 AM  Basic Labs  Sodium 135 - 146 mmol/L 139  139  137   Potassium 3.5 - 5.3 mmol/L 4.6  3.7  3.4   Creatinine 0.70 - 1.35 mg/dL 5.95  6.38  7.56        Latest Ref Rng & Units 10/26/2015    2:43 PM 09/19/2015    9:10 AM  Thyroid   TSH 0.350 - 4.500 uIU/mL 0.648  0.141  he *** interested in enrolling in the PREP exercise and nutrition program through the [facility].     Disposition:    FU with MD/APP/PharmD in {gen number 0-35:009381} {Days to years:10300}    Medication Adjustments/Labs and Tests Ordered: Current medicines are reviewed at length with the patient today.  Concerns regarding medicines are outlined above.  No orders of the defined types were placed in this encounter.  No orders of the defined types were placed in this encounter.    Signed, Alver Sorrow, NP  12/28/2023 12:23 PM     [facility]

## 2024-01-01 DIAGNOSIS — Z7689 Persons encountering health services in other specified circumstances: Secondary | ICD-10-CM | POA: Diagnosis not present

## 2024-01-02 DIAGNOSIS — Z7689 Persons encountering health services in other specified circumstances: Secondary | ICD-10-CM | POA: Diagnosis not present

## 2024-01-03 DIAGNOSIS — Z7689 Persons encountering health services in other specified circumstances: Secondary | ICD-10-CM | POA: Diagnosis not present

## 2024-01-04 ENCOUNTER — Encounter (HOSPITAL_BASED_OUTPATIENT_CLINIC_OR_DEPARTMENT_OTHER): Payer: Self-pay

## 2024-01-04 DIAGNOSIS — Z7689 Persons encountering health services in other specified circumstances: Secondary | ICD-10-CM | POA: Diagnosis not present

## 2024-01-05 DIAGNOSIS — Z7689 Persons encountering health services in other specified circumstances: Secondary | ICD-10-CM | POA: Diagnosis not present

## 2024-01-06 DIAGNOSIS — Z7689 Persons encountering health services in other specified circumstances: Secondary | ICD-10-CM | POA: Diagnosis not present

## 2024-01-06 DIAGNOSIS — Z419 Encounter for procedure for purposes other than remedying health state, unspecified: Secondary | ICD-10-CM | POA: Diagnosis not present

## 2024-01-08 DIAGNOSIS — Z7689 Persons encountering health services in other specified circumstances: Secondary | ICD-10-CM | POA: Diagnosis not present

## 2024-01-09 DIAGNOSIS — Z7689 Persons encountering health services in other specified circumstances: Secondary | ICD-10-CM | POA: Diagnosis not present

## 2024-01-10 ENCOUNTER — Inpatient Hospital Stay: Admitting: Family Medicine

## 2024-01-10 DIAGNOSIS — Z7689 Persons encountering health services in other specified circumstances: Secondary | ICD-10-CM | POA: Diagnosis not present

## 2024-01-11 DIAGNOSIS — Z7689 Persons encountering health services in other specified circumstances: Secondary | ICD-10-CM | POA: Diagnosis not present

## 2024-01-12 DIAGNOSIS — Z7689 Persons encountering health services in other specified circumstances: Secondary | ICD-10-CM | POA: Diagnosis not present

## 2024-01-13 DIAGNOSIS — Z7689 Persons encountering health services in other specified circumstances: Secondary | ICD-10-CM | POA: Diagnosis not present

## 2024-01-15 DIAGNOSIS — Z7689 Persons encountering health services in other specified circumstances: Secondary | ICD-10-CM | POA: Diagnosis not present

## 2024-01-16 DIAGNOSIS — Z7689 Persons encountering health services in other specified circumstances: Secondary | ICD-10-CM | POA: Diagnosis not present

## 2024-01-17 DIAGNOSIS — Z7689 Persons encountering health services in other specified circumstances: Secondary | ICD-10-CM | POA: Diagnosis not present

## 2024-01-18 DIAGNOSIS — Z7689 Persons encountering health services in other specified circumstances: Secondary | ICD-10-CM | POA: Diagnosis not present

## 2024-01-19 DIAGNOSIS — Z7689 Persons encountering health services in other specified circumstances: Secondary | ICD-10-CM | POA: Diagnosis not present

## 2024-01-20 DIAGNOSIS — Z7689 Persons encountering health services in other specified circumstances: Secondary | ICD-10-CM | POA: Diagnosis not present

## 2024-01-22 DIAGNOSIS — Z7689 Persons encountering health services in other specified circumstances: Secondary | ICD-10-CM | POA: Diagnosis not present

## 2024-01-23 DIAGNOSIS — Z7689 Persons encountering health services in other specified circumstances: Secondary | ICD-10-CM | POA: Diagnosis not present

## 2024-01-24 DIAGNOSIS — Z7689 Persons encountering health services in other specified circumstances: Secondary | ICD-10-CM | POA: Diagnosis not present

## 2024-01-25 DIAGNOSIS — Z7689 Persons encountering health services in other specified circumstances: Secondary | ICD-10-CM | POA: Diagnosis not present

## 2024-01-26 DIAGNOSIS — Z7689 Persons encountering health services in other specified circumstances: Secondary | ICD-10-CM | POA: Diagnosis not present

## 2024-01-27 DIAGNOSIS — Z7689 Persons encountering health services in other specified circumstances: Secondary | ICD-10-CM | POA: Diagnosis not present

## 2024-01-29 DIAGNOSIS — Z7689 Persons encountering health services in other specified circumstances: Secondary | ICD-10-CM | POA: Diagnosis not present

## 2024-01-30 DIAGNOSIS — Z7689 Persons encountering health services in other specified circumstances: Secondary | ICD-10-CM | POA: Diagnosis not present

## 2024-01-31 ENCOUNTER — Other Ambulatory Visit: Payer: Self-pay

## 2024-01-31 ENCOUNTER — Other Ambulatory Visit: Payer: Self-pay | Admitting: Family Medicine

## 2024-01-31 DIAGNOSIS — Z7689 Persons encountering health services in other specified circumstances: Secondary | ICD-10-CM | POA: Diagnosis not present

## 2024-01-31 MED ORDER — DULOXETINE HCL 30 MG PO CPEP: 30.0000 mg | ORAL_CAPSULE | Freq: Two times a day (BID) | ORAL | 0 refills | Status: DC

## 2024-01-31 MED ORDER — GABAPENTIN 300 MG PO CAPS: 300.0000 mg | ORAL_CAPSULE | Freq: Three times a day (TID) | ORAL | 0 refills | Status: DC

## 2024-01-31 MED ORDER — LORATADINE 10 MG PO TABS: 10.0000 mg | ORAL_TABLET | Freq: Every day | ORAL | 0 refills | Status: DC

## 2024-01-31 MED ORDER — LOSARTAN POTASSIUM 100 MG PO TABS: 100.0000 mg | ORAL_TABLET | Freq: Every day | ORAL | 0 refills | Status: DC

## 2024-01-31 MED ORDER — RIVAROXABAN 20 MG PO TABS: 20.0000 mg | ORAL_TABLET | Freq: Every day | ORAL | 0 refills | Status: DC

## 2024-02-01 ENCOUNTER — Ambulatory Visit: Admitting: Family Medicine

## 2024-02-01 ENCOUNTER — Encounter: Payer: Self-pay | Admitting: Family Medicine

## 2024-02-01 VITALS — BP 160/100 | HR 66 | Temp 98.2°F | Ht 71.0 in | Wt 245.4 lb

## 2024-02-01 DIAGNOSIS — D72820 Lymphocytosis (symptomatic): Secondary | ICD-10-CM | POA: Diagnosis not present

## 2024-02-01 DIAGNOSIS — I10 Essential (primary) hypertension: Secondary | ICD-10-CM | POA: Diagnosis not present

## 2024-02-01 DIAGNOSIS — R600 Localized edema: Secondary | ICD-10-CM | POA: Diagnosis not present

## 2024-02-01 DIAGNOSIS — Z7689 Persons encountering health services in other specified circumstances: Secondary | ICD-10-CM | POA: Diagnosis not present

## 2024-02-01 MED ORDER — NEBIVOLOL HCL 5 MG PO TABS: 5.0000 mg | ORAL_TABLET | Freq: Every day | ORAL | 3 refills | Status: DC

## 2024-02-01 MED ORDER — CLONIDINE HCL 0.1 MG PO TABS: 0.1000 mg | ORAL_TABLET | Freq: Three times a day (TID) | ORAL | 1 refills | Status: DC

## 2024-02-01 NOTE — Assessment & Plan Note (Signed)
 Repeat CBC with HIV, Hep C, ANA today

## 2024-02-01 NOTE — Assessment & Plan Note (Signed)
 Uncontrolled. Need to verify medications. Sending pt to ED for LLE. Encouraged to bring medications with him back to office in 1 week to verify what he is taking and make adjustments as needed.

## 2024-02-01 NOTE — Progress Notes (Signed)
 Subjective:  HPI: [name] [name] is a 63 y.o. male presenting on 02/01/2024 for Edema (Pt has swelling of the left lower leg and foot with darkening of the foot./Pt reports peeling of both feet)   HPI Patient is in today for follow up. He reports today he has been drug free for 5 months and is on Methadone 150mg  daily taper. He is attending [facility] and seeing a weekly counselor. [name] [name] reports medication compliance with his list of medications. Concerns include redness and swelling of left foot. He endorses calf pain and pain when walking. Has known history of DVT and is on Xarelto, reports compliance. No recent travel or surgery. Methadone clinic labs showed lymphocytosis and that MD requested HIV, Hep C, and ANA.   HYPERTENSION without Chronic Kidney Disease Hypertension status: uncontrolled  Satisfied with current treatment? yes Duration of hypertension: chronic BP monitoring frequency:  not checking BP range:  BP medication side effects:  no Medication compliance:  compliant Previous BP meds:clonidine, losartan, nebivolol Aspirin: no Recurrent headaches: no Visual changes: no Palpitations: no Dyspnea: no Chest pain: no Lower extremity edema: yes Dizzy/lightheaded: no   Review of Systems  All other systems reviewed and are negative.   Relevant past medical history reviewed and updated as indicated.   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   Current Outpatient Medications:    cloNIDine (CATAPRES) 0.1 MG tablet, Take 1 tablet (0.1 mg total) by mouth 3 (three) times daily., Disp: 270 tablet, Rfl: 1   DULoxetine (CYMBALTA) 30 MG capsule, Take 1 capsule (30 mg total) by mouth 2 (two) times daily., Disp: 180 capsule, Rfl: 0   gabapentin (NEURONTIN) 300 MG capsule, Take 1 capsule (300 mg total) by mouth 3 (three) times daily., Disp: 270 capsule, Rfl: 0   loratadine (CLARITIN) 10 MG tablet, Take 1 tablet (10 mg total) by mouth daily., Disp: 90 tablet, Rfl: 0   losartan (COZAAR) 100 MG tablet, Take 1 tablet (100 mg total) by mouth daily., Disp: 90 tablet, Rfl: 0   nebivolol (BYSTOLIC) 5 MG tablet, Take 1 tablet (5 mg total) by mouth daily., Disp: 90 tablet, Rfl: 3   rivaroxaban (XARELTO) 20 MG TABS tablet, Take 1 tablet (20 mg total) by mouth daily with supper., Disp: 90 tablet, Rfl: 0  Allergies  Allergen Reactions   Amlodipine Swelling    BLE peripheral edema     Objective:   BP (!) 160/100   Pulse 66   Temp 98.2 F (36.8 C)   Ht 5\' 11"  (1.803 m)   Wt 245 lb 6.4 oz (111.3 kg)   SpO2 96%  BMI 34.23 kg/m      02/01/2024    3:06 PM 09/12/2023    2:17 PM 09/12/2023    2:13 PM  Vitals with BMI  Height 5\' 11"   5\' 11"   Weight 245 lbs 6 oz  206 lbs  BMI 34.24  28.74  Systolic 160 182 272  Diastolic 100 90 92  Pulse 66  54     Physical Exam Vitals and nursing note reviewed.  Constitutional:      Appearance: Normal appearance. He is normal weight.  HENT:     Head: Normocephalic and atraumatic.  Cardiovascular:     Rate and Rhythm: Normal rate and regular rhythm.     Pulses: Normal pulses.     Heart sounds: Normal heart sounds.  Pulmonary:     Effort: Pulmonary effort is normal.     Breath sounds: Normal breath sounds.  Skin:    General: Skin is warm and dry.     Capillary  Refill: Capillary refill takes less than 2 seconds.  Neurological:     General: No focal deficit present.     Mental Status: He is alert and oriented to person, place, and time. Mental status is at baseline.  Psychiatric:        Mood and Affect: Mood normal.        Behavior: Behavior normal.        Thought Content: Thought content normal.        Judgment: Judgment normal.     Assessment & Plan:  Lymphocytosis Assessment & Plan: Repeat CBC with HIV, Hep C, ANA today  Orders: -     HIV Antibody (routine testing w rflx) -     Hepatitis Panel (REFL) -     ANA Screen,IFA,Reflex Titer/Pattern,Reflex Mplx 11 Ab Cascade with IdentRA -     CBC with Differential/Platelet  Primary hypertension Assessment & Plan: Uncontrolled. Need to verify medications. Sending pt to ED for LLE. Encouraged to bring medications with him back to office in 1 week to verify what he is taking and make adjustments as needed.   Edema of left lower leg Assessment & Plan: Left foot is purple and cold to touch. He endorses pain. It is red and swollen to the level of upper calf. I am concerned for loss of arterial flow. Discussed risks with pt and strongly encouraged him to proceed to ED. He has known history of DVT and is on Xarelto.   Other orders -     cloNIDine HCl; Take 1 tablet (0.1 mg total) by mouth 3 (three) times daily.  Dispense: 270 tablet; Refill: 1 -     Nebivolol HCl; Take 1 tablet (5 mg total) by mouth daily.  Dispense: 90 tablet; Refill: 3     Follow up plan: Return in about 1 week (around 02/08/2024) for hypertension.  Park Meo, FNP

## 2024-02-01 NOTE — Assessment & Plan Note (Signed)
 Left foot is purple and cold to touch. He endorses pain. It is red and swollen to the level of upper calf. I am concerned for loss of arterial flow. Discussed risks with pt and strongly encouraged him to proceed to ED. He has known history of DVT and is on Xarelto.

## 2024-02-02 ENCOUNTER — Encounter (INDEPENDENT_AMBULATORY_CARE_PROVIDER_SITE_OTHER): Payer: Self-pay

## 2024-02-02 DIAGNOSIS — Z7689 Persons encountering health services in other specified circumstances: Secondary | ICD-10-CM | POA: Diagnosis not present

## 2024-02-02 LAB — HEPATITIS PANEL(REFL)
HEPATITIS C ANTIBODY REFILL$(REFL): NONREACTIVE
Hep B Core Total Ab: NONREACTIVE
Hep B S Ab: NONREACTIVE
Hepatitis A AB,Total: REACTIVE — AB
Hepatitis B Surface Ag: NONREACTIVE

## 2024-02-02 LAB — CBC WITH DIFFERENTIAL/PLATELET
Absolute Lymphocytes: 5121 {cells}/uL — ABNORMAL HIGH (ref 850–3900)
Absolute Monocytes: 578 {cells}/uL (ref 200–950)
Basophils Absolute: 83 {cells}/uL (ref 0–200)
Basophils Relative: 0.7 %
Eosinophils Absolute: 224 {cells}/uL (ref 15–500)
Eosinophils Relative: 1.9 %
HCT: 42.2 % (ref 38.5–50.0)
Hemoglobin: 14.2 g/dL (ref 13.2–17.1)
MCH: 32.7 pg (ref 27.0–33.0)
MCHC: 33.6 g/dL (ref 32.0–36.0)
MCV: 97.2 fL (ref 80.0–100.0)
MPV: 9.6 fL (ref 7.5–12.5)
Monocytes Relative: 4.9 %
Neutro Abs: 5794 {cells}/uL (ref 1500–7800)
Neutrophils Relative %: 49.1 %
Platelets: 253 10*3/uL (ref 140–400)
RBC: 4.34 10*6/uL (ref 4.20–5.80)
RDW: 12.5 % (ref 11.0–15.0)
Total Lymphocyte: 43.4 %
WBC: 11.8 10*3/uL — ABNORMAL HIGH (ref 3.8–10.8)

## 2024-02-02 LAB — REFLEX TIQ

## 2024-02-02 LAB — HIV ANTIBODY (ROUTINE TESTING W REFLEX): HIV 1&2 Ab, 4th Generation: NONREACTIVE

## 2024-02-03 DIAGNOSIS — Z7689 Persons encountering health services in other specified circumstances: Secondary | ICD-10-CM | POA: Diagnosis not present

## 2024-02-05 DIAGNOSIS — Z7689 Persons encountering health services in other specified circumstances: Secondary | ICD-10-CM | POA: Diagnosis not present

## 2024-02-06 DIAGNOSIS — Z7689 Persons encountering health services in other specified circumstances: Secondary | ICD-10-CM | POA: Diagnosis not present

## 2024-02-07 DIAGNOSIS — Z7689 Persons encountering health services in other specified circumstances: Secondary | ICD-10-CM | POA: Diagnosis not present

## 2024-02-08 ENCOUNTER — Encounter: Payer: Self-pay | Admitting: Family Medicine

## 2024-02-08 DIAGNOSIS — Z7689 Persons encountering health services in other specified circumstances: Secondary | ICD-10-CM | POA: Diagnosis not present

## 2024-02-09 DIAGNOSIS — Z7689 Persons encountering health services in other specified circumstances: Secondary | ICD-10-CM | POA: Diagnosis not present

## 2024-02-10 DIAGNOSIS — Z7689 Persons encountering health services in other specified circumstances: Secondary | ICD-10-CM | POA: Diagnosis not present

## 2024-02-12 DIAGNOSIS — Z7689 Persons encountering health services in other specified circumstances: Secondary | ICD-10-CM | POA: Diagnosis not present

## 2024-02-13 DIAGNOSIS — Z7689 Persons encountering health services in other specified circumstances: Secondary | ICD-10-CM | POA: Diagnosis not present

## 2024-02-14 DIAGNOSIS — Z7689 Persons encountering health services in other specified circumstances: Secondary | ICD-10-CM | POA: Diagnosis not present

## 2024-02-15 ENCOUNTER — Ambulatory Visit: Admitting: Family Medicine

## 2024-02-15 DIAGNOSIS — Z7689 Persons encountering health services in other specified circumstances: Secondary | ICD-10-CM | POA: Diagnosis not present

## 2024-02-16 DIAGNOSIS — Z7689 Persons encountering health services in other specified circumstances: Secondary | ICD-10-CM | POA: Diagnosis not present

## 2024-02-17 DIAGNOSIS — Z7689 Persons encountering health services in other specified circumstances: Secondary | ICD-10-CM | POA: Diagnosis not present

## 2024-02-17 DIAGNOSIS — Z419 Encounter for procedure for purposes other than remedying health state, unspecified: Secondary | ICD-10-CM | POA: Diagnosis not present

## 2024-02-19 DIAGNOSIS — Z7689 Persons encountering health services in other specified circumstances: Secondary | ICD-10-CM | POA: Diagnosis not present

## 2024-02-20 DIAGNOSIS — Z7689 Persons encountering health services in other specified circumstances: Secondary | ICD-10-CM | POA: Diagnosis not present

## 2024-02-21 DIAGNOSIS — Z7689 Persons encountering health services in other specified circumstances: Secondary | ICD-10-CM | POA: Diagnosis not present

## 2024-02-22 DIAGNOSIS — Z7689 Persons encountering health services in other specified circumstances: Secondary | ICD-10-CM | POA: Diagnosis not present

## 2024-02-23 DIAGNOSIS — Z7689 Persons encountering health services in other specified circumstances: Secondary | ICD-10-CM | POA: Diagnosis not present

## 2024-02-24 DIAGNOSIS — Z7689 Persons encountering health services in other specified circumstances: Secondary | ICD-10-CM | POA: Diagnosis not present

## 2024-02-26 DIAGNOSIS — Z7689 Persons encountering health services in other specified circumstances: Secondary | ICD-10-CM | POA: Diagnosis not present

## 2024-02-27 DIAGNOSIS — Z7689 Persons encountering health services in other specified circumstances: Secondary | ICD-10-CM | POA: Diagnosis not present

## 2024-02-28 DIAGNOSIS — Z7689 Persons encountering health services in other specified circumstances: Secondary | ICD-10-CM | POA: Diagnosis not present

## 2024-02-29 DIAGNOSIS — Z7689 Persons encountering health services in other specified circumstances: Secondary | ICD-10-CM | POA: Diagnosis not present

## 2024-03-01 DIAGNOSIS — Z7689 Persons encountering health services in other specified circumstances: Secondary | ICD-10-CM | POA: Diagnosis not present

## 2024-03-02 DIAGNOSIS — Z7689 Persons encountering health services in other specified circumstances: Secondary | ICD-10-CM | POA: Diagnosis not present

## 2024-03-04 DIAGNOSIS — Z7689 Persons encountering health services in other specified circumstances: Secondary | ICD-10-CM | POA: Diagnosis not present

## 2024-03-05 DIAGNOSIS — Z7689 Persons encountering health services in other specified circumstances: Secondary | ICD-10-CM | POA: Diagnosis not present

## 2024-03-06 ENCOUNTER — Encounter: Payer: Self-pay | Admitting: Family Medicine

## 2024-03-06 ENCOUNTER — Ambulatory Visit (INDEPENDENT_AMBULATORY_CARE_PROVIDER_SITE_OTHER): Admitting: Family Medicine

## 2024-03-06 ENCOUNTER — Other Ambulatory Visit: Payer: Self-pay | Admitting: Family Medicine

## 2024-03-06 VITALS — BP 170/110 | HR 59 | Ht 71.0 in | Wt 256.1 lb

## 2024-03-06 DIAGNOSIS — Z0000 Encounter for general adult medical examination without abnormal findings: Secondary | ICD-10-CM

## 2024-03-06 DIAGNOSIS — Z125 Encounter for screening for malignant neoplasm of prostate: Secondary | ICD-10-CM

## 2024-03-06 DIAGNOSIS — Z6835 Body mass index (BMI) 35.0-35.9, adult: Secondary | ICD-10-CM | POA: Diagnosis not present

## 2024-03-06 DIAGNOSIS — Z0001 Encounter for general adult medical examination with abnormal findings: Secondary | ICD-10-CM | POA: Diagnosis not present

## 2024-03-06 DIAGNOSIS — E785 Hyperlipidemia, unspecified: Secondary | ICD-10-CM

## 2024-03-06 DIAGNOSIS — I10 Essential (primary) hypertension: Secondary | ICD-10-CM | POA: Diagnosis not present

## 2024-03-06 DIAGNOSIS — R600 Localized edema: Secondary | ICD-10-CM | POA: Diagnosis not present

## 2024-03-06 DIAGNOSIS — Z13220 Encounter for screening for lipoid disorders: Secondary | ICD-10-CM | POA: Diagnosis not present

## 2024-03-06 DIAGNOSIS — Z7689 Persons encountering health services in other specified circumstances: Secondary | ICD-10-CM | POA: Diagnosis not present

## 2024-03-06 DIAGNOSIS — J449 Chronic obstructive pulmonary disease, unspecified: Secondary | ICD-10-CM | POA: Diagnosis not present

## 2024-03-06 DIAGNOSIS — L989 Disorder of the skin and subcutaneous tissue, unspecified: Secondary | ICD-10-CM

## 2024-03-06 DIAGNOSIS — Z1211 Encounter for screening for malignant neoplasm of colon: Secondary | ICD-10-CM | POA: Diagnosis not present

## 2024-03-06 DIAGNOSIS — E66812 Obesity, class 2: Secondary | ICD-10-CM | POA: Diagnosis not present

## 2024-03-06 DIAGNOSIS — H547 Unspecified visual loss: Secondary | ICD-10-CM

## 2024-03-06 DIAGNOSIS — F17210 Nicotine dependence, cigarettes, uncomplicated: Secondary | ICD-10-CM | POA: Diagnosis not present

## 2024-03-06 MED ORDER — HYDROCHLOROTHIAZIDE 12.5 MG PO TABS: 12.5000 mg | ORAL_TABLET | Freq: Every day | ORAL | 3 refills | Status: DC

## 2024-03-06 NOTE — Assessment & Plan Note (Signed)
 Stable

## 2024-03-06 NOTE — Assessment & Plan Note (Signed)
 Today your medical history was reviewed and routine physical exam with labs was performed. Recommend 150 minutes of moderate intensity exercise weekly and consuming a well-balanced diet. Advised to stop smoking if a smoker, avoid smoking if a non-smoker, limit alcohol consumption to 1 drink per day for women and 2 drinks per day for men, and avoid illicit drug use.  Counseled in mental health awareness and when to seek medical care. Vaccine maintenance discussed. Appropriate health maintenance items reviewed. Return to office in 1 year for annual physical exam.

## 2024-03-06 NOTE — Assessment & Plan Note (Signed)
 Uncontrolled. Start hydrochlorothiazide  12.5mg  daily per cardiology. Continue clonidine , losartan , bystolic . Seeing cardiology. Follow up with me in 2 weeks or sooner if needed. Recommend heart healthy diet such as Mediterranean diet with whole grains, fruits, vegetable, fish, lean meats, nuts, and olive oil. Limit salt. Encouraged moderate walking, 3-5 times/week for 30-50 minutes each session. Aim for at least 150 minutes.week. Goal should be pace of 3 miles/hours, or walking 1.5 miles in 30 minutes. Avoid tobacco products. Avoid excess alcohol. Take medications as prescribed and bring medications and blood pressure log with cuff to each office visit. Seek medical care for chest pain, palpitations, shortness of breath with exertion, dizziness/lightheadedness, vision changes, recurrent headaches, or swelling of extremities.

## 2024-03-06 NOTE — Progress Notes (Signed)
 Subjective:  HPI: [name] [name] is a 63 y.o. male presenting on 03/06/2024 for Hypertension   Hypertension   Patient is in today for blood pressure follow up and would like CPE. Has appt with HTN clinic 03/27/2024 Did not go to ED for his LLE after last visit, is being seen at center for vein restoration 03/11/2024, lower extremities remains edematous. Continues to be seen at Methadone clinic and remains drug free. Discussed recent labs with elevated WBCs and Hepatitis A. Informed this is self-limited. Will recheck CBC and full labs today. Poor follow-up with medical care.   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 no Duration of hypertension: chronic BP monitoring frequency:  not checking BP range:  BP medication side effects:  no Medication compliance: excellent compliance Previous BP meds: Bystolic , Clonidine , losartan , amlodipine  Aspirin : no Recurrent headaches: no Visual changes: no Palpitations: no Dyspnea: no Chest pain: no Lower extremity edema: no Dizzy/lightheaded: no  Agrees to colonoscopy given positive cologuard in past Agreeable to lung cancer screening   Health Maintenance  Topic Date Due   Zoster Vaccines- Shingrix (1 of 2) Never done   Fecal DNA (Cologuard)  Never done   Pneumococcal Vaccine 70-68 Years old (2 of 2 - PCV) 06/25/2014   DTaP/Tdap/Td (2 - Td or Tdap) 11/30/2021   Lung Cancer Screening  11/22/2022   COVID-19 Vaccine (1) 03/22/2024 (Originally 01/30/1966)   INFLUENZA VACCINE  06/07/2024   Hepatitis C Screening  Completed   HIV Screening  Completed   HPV VACCINES  Aged Out   Meningococcal B Vaccine  Aged Out     Review of Systems  Constitutional: Negative.    HENT: Negative.    Eyes: Negative.        Poor vision  Respiratory: Negative.    Cardiovascular: Negative.   Gastrointestinal: Negative.   Genitourinary: Negative.   Musculoskeletal: Negative.   Skin: Negative.        lesions  Neurological: Negative.   Endo/Heme/Allergies: Negative.   Psychiatric/Behavioral: Negative.    All other systems reviewed and are negative.   Relevant past medical history reviewed and updated as indicated.   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   Current Outpatient Medications:    cloNIDine  (CATAPRES ) 0.1 MG tablet, Take 1 tablet (0.1 mg total) by mouth 3 (three) times daily., Disp: 270 tablet, Rfl: 1   DULoxetine  (CYMBALTA ) 30 MG capsule, Take 1  capsule (30 mg total) by mouth 2 (two) times daily., Disp: 180 capsule, Rfl: 0   gabapentin  (NEURONTIN ) 300 MG capsule, Take 1 capsule (300 mg total) by mouth 3 (three) times daily., Disp: 270 capsule, Rfl: 0   hydrochlorothiazide  (HYDRODIURIL ) 12.5 MG tablet, Take 1 tablet (12.5 mg total) by mouth daily., Disp: 90 tablet, Rfl: 3   loratadine  (CLARITIN ) 10 MG tablet, Take 1 tablet (10 mg total) by mouth daily., Disp: 90 tablet, Rfl: 0   losartan  (COZAAR ) 100 MG tablet, Take 1 tablet (100 mg total) by mouth daily., Disp: 90 tablet, Rfl: 0   nebivolol  (BYSTOLIC )  5 MG tablet, Take 1 tablet (5 mg total) by mouth daily., Disp: 90 tablet, Rfl: 3   rivaroxaban  (XARELTO ) 20 MG TABS tablet, Take 1 tablet (20 mg total) by mouth daily with supper., Disp: 90 tablet, Rfl: 0  Allergies  Allergen Reactions   Amlodipine  Swelling    BLE peripheral edema     Objective:   BP (!) 170/110   Pulse (!) 59   Ht 5\' 11"  (1.803 m)   Wt 256 lb 2 oz (116.2 kg)   SpO2 97%   BMI 35.72 kg/m      03/06/2024    2:31 PM 02/01/2024    3:06 PM 09/12/2023    2:17 PM  Vitals with BMI  Height 5\' 11"  5\' 11"    Weight 256 lbs 2 oz 245 lbs 6 oz   BMI 35.74 34.24   Systolic 170 160 914  Diastolic 110 100 90  Pulse 59 66      Physical Exam Vitals and nursing note reviewed.  Constitutional:      Appearance: Normal appearance. He is obese.     Comments: Ambulating with cane  HENT:     Head: Normocephalic and atraumatic.     Right Ear: Tympanic membrane, ear canal and external ear normal.     Left Ear: Tympanic membrane, ear canal and external ear normal.     Nose: Nose normal.     Mouth/Throat:     Mouth: Mucous membranes are moist.     Pharynx: Oropharynx is clear.  Eyes:     Extraocular Movements: Extraocular movements intact.     Right eye: Normal extraocular motion and no nystagmus.     Left eye: Normal extraocular motion and no nystagmus.     Conjunctiva/sclera: Conjunctivae normal.     Pupils: Pupils are equal, round, and reactive to light.  Cardiovascular:     Rate and Rhythm: Normal rate and regular rhythm.     Pulses: Normal pulses.     Heart sounds: Normal heart sounds.  Pulmonary:     Effort: Pulmonary effort is normal.     Breath sounds: Rhonchi present.  Abdominal:     General: Bowel sounds are normal.     Palpations: Abdomen is soft.  Genitourinary:    Comments: Deferred using shared decision making Musculoskeletal:        General: Normal range of motion.     Cervical back: Normal range of motion and neck supple.  Skin:    General: Skin is  warm and dry.     Capillary Refill: Capillary refill takes less than 2 seconds.     Findings: Lesion present.     Comments: Scattered tan colored lesions  Neurological:     General: No focal deficit present.     Mental Status: He is alert. Mental status is at baseline.  Psychiatric:  Mood and Affect: Mood normal.        Speech: Speech normal.        Behavior: Behavior normal.        Thought Content: Thought content normal.        Cognition and Memory: Cognition and memory normal.        Judgment: Judgment normal.     Assessment & Plan:  Physical exam, annual Assessment & Plan: Today your medical history was reviewed and routine physical exam with labs was performed. Recommend 150 minutes of moderate intensity exercise weekly and consuming a well-balanced diet. Advised to stop smoking if a smoker, avoid smoking if a non-smoker, limit alcohol consumption to 1 drink per [place] for women and 2 drinks per [place] for men, and avoid illicit drug use. Counseled in mental health awareness and when to seek medical care. Vaccine maintenance discussed. Appropriate health maintenance items reviewed. Return to office in 1 year for annual physical exam.   Orders: -     Comprehensive metabolic panel with GFR -     CBC with Differential/Platelet -     Lipid panel -     TSH -     Hemoglobin A1c -     Ambulatory Referral for Lung Cancer Scre -     Ambulatory referral to Gastroenterology -     PSA -     Ambulatory referral to Dermatology  Colon cancer screening -     Ambulatory referral to Gastroenterology  Prostate cancer screening -     PSA  Screening for lipoid disorders -     Lipid panel  Class 2 severe obesity with serious comorbidity and body mass index (BMI) of 35.0 to 35.9 in adult, unspecified obesity type [facility]) Assessment & Plan: Counseled on importance of weight management for overall health. Encouraged low calorie, heart healthy diet and moderate intensity exercise as  tolerated  Orders: -     Comprehensive metabolic panel with GFR -     CBC with Differential/Platelet -     Lipid panel -     TSH -     Hemoglobin A1c  Facial skin lesion Assessment & Plan: Referral to derm  Orders: -     Ambulatory referral to Dermatology  Nicotine  dependence, cigarettes, uncomplicated Assessment & Plan: 3-5 minute discussion regarding the harms of tobacco use, the benefits of cessation, and methods of cessation. Discussed that there are medication options to help with cessation. Provided printed education on steps to quit smoking. Patient is not on a medication to help.   Orders: -     Ambulatory Referral for Lung Cancer Scre  Bilateral lower extremity edema Assessment & Plan: BNP today. Known hx of DVT. Vascular studies upcoming.   Orders: -     Brain natriuretic peptide  Vision loss Assessment & Plan: Refer to opthamology  Orders: -     Ambulatory referral to Ophthalmology  Chronic obstructive pulmonary disease, unspecified COPD type (HCC) Assessment & Plan: Stable.    Hypertension, unspecified type Assessment & Plan: Uncontrolled. Start hydrochlorothiazide  12.5mg  daily per cardiology. Continue clonidine , losartan , bystolic . Seeing cardiology. Follow up with me in 2 weeks or sooner if needed. Recommend heart healthy diet such as Mediterranean diet with whole grains, fruits, vegetable, fish, lean meats, nuts, and olive oil. Limit salt. Encouraged moderate walking, 3-5 times/week for 30-50 minutes each session. Aim for at least 150 minutes.week. Goal should be pace of 3 miles/hours, or walking 1.5 miles in 30 minutes. Avoid tobacco products. Avoid  excess alcohol. Take medications as prescribed and bring medications and blood pressure log with cuff to each office visit. Seek medical care for chest pain, palpitations, shortness of breath with exertion, dizziness/lightheadedness, vision changes, recurrent headaches, or swelling of  extremities.    Hyperlipidemia, unspecified hyperlipidemia type Assessment & Plan:  I recommend consuming a heart healthy diet such as Mediterranean diet or DASH diet with whole grains, fruits, vegetable, fish, lean meats, nuts, and olive oil. Limit sweets and processed foods. I also encourage moderate intensity exercise 150 minutes weekly. This is 3-5 times weekly for 30-50 minutes each session. Goal should be pace of 3 miles/hours, or walking 1.5 miles in 30 minutes. The ASCVD Risk score (Arnett DK, et al., 2019) failed to calculate for the following reasons:   Risk score cannot be calculated because patient has a medical history suggesting prior/existing ASCVD Labs today, refused statin previously, needs reassessment   Edema of left lower leg  Other orders -     hydroCHLOROthiazide ; Take 1 tablet (12.5 mg total) by mouth daily.  Dispense: 90 tablet; Refill: 3     Follow up plan: Return in about 2 weeks (around 03/20/2024) for follow-up.  Jenelle Mis, FNP

## 2024-03-06 NOTE — Assessment & Plan Note (Signed)
 Counseled on importance of weight management for overall health. Encouraged low calorie, heart healthy diet and moderate intensity exercise as tolerated.

## 2024-03-06 NOTE — Assessment & Plan Note (Signed)
 I recommend consuming a heart healthy diet such as Mediterranean diet or DASH diet with whole grains, fruits, vegetable, fish, lean meats, nuts, and olive oil. Limit sweets and processed foods. I also encourage moderate intensity exercise 150 minutes weekly. This is 3-5 times weekly for 30-50 minutes each session. Goal should be pace of 3 miles/hours, or walking 1.5 miles in 30 minutes. The ASCVD Risk score (Arnett DK, et al., 2019) failed to calculate for the following reasons:   Risk score cannot be calculated because patient has a medical history suggesting prior/existing ASCVD Labs today, refused statin previously, needs reassessment

## 2024-03-06 NOTE — Assessment & Plan Note (Signed)
 BNP today. Known hx of DVT. Vascular studies upcoming.

## 2024-03-06 NOTE — Assessment & Plan Note (Signed)
>>  ASSESSMENT AND PLAN FOR CLASS 2 SEVERE OBESITY WITH SERIOUS COMORBIDITY AND BODY MASS INDEX (BMI) OF 35.0 TO 35.9 IN ADULT (HCC) WRITTEN ON 03/06/2024  4:33 PM BY HOWARD, AMBER S, FNP  Counseled on importance of weight management for overall health. Encouraged low calorie, heart healthy diet and moderate intensity exercise as tolerated

## 2024-03-06 NOTE — Assessment & Plan Note (Signed)
Referral to derm  

## 2024-03-06 NOTE — Assessment & Plan Note (Signed)
Refer to opthamology

## 2024-03-06 NOTE — Assessment & Plan Note (Signed)
 3-5 minute discussion regarding the harms of tobacco use, the benefits of cessation, and methods of cessation. Discussed that there are medication options to help with cessation. Provided printed education on steps to quit smoking. Patient is not on a medication to help.

## 2024-03-07 ENCOUNTER — Other Ambulatory Visit: Payer: Self-pay | Admitting: Family Medicine

## 2024-03-07 DIAGNOSIS — Z7689 Persons encountering health services in other specified circumstances: Secondary | ICD-10-CM | POA: Diagnosis not present

## 2024-03-07 DIAGNOSIS — R600 Localized edema: Secondary | ICD-10-CM

## 2024-03-07 LAB — COMPREHENSIVE METABOLIC PANEL WITH GFR
AG Ratio: 1.4 (calc) (ref 1.0–2.5)
ALT: 12 U/L (ref 9–46)
AST: 13 U/L (ref 10–35)
Albumin: 3.8 g/dL (ref 3.6–5.1)
Alkaline phosphatase (APISO): 90 U/L (ref 35–144)
BUN: 17 mg/dL (ref 7–25)
CO2: 27 mmol/L (ref 20–32)
Calcium: 8.9 mg/dL (ref 8.6–10.3)
Chloride: 104 mmol/L (ref 98–110)
Creat: 0.79 mg/dL (ref 0.70–1.35)
Globulin: 2.8 g/dL (ref 1.9–3.7)
Glucose, Bld: 87 mg/dL (ref 65–99)
Potassium: 5 mmol/L (ref 3.5–5.3)
Sodium: 139 mmol/L (ref 135–146)
Total Bilirubin: 0.4 mg/dL (ref 0.2–1.2)
Total Protein: 6.6 g/dL (ref 6.1–8.1)
eGFR: 100 mL/min/{1.73_m2} (ref >=60)

## 2024-03-07 LAB — CBC WITH DIFFERENTIAL/PLATELET
Absolute Lymphocytes: 3374 {cells}/uL (ref 850–3900)
Absolute Monocytes: 577 {cells}/uL (ref 200–950)
Basophils Absolute: 56 {cells}/uL (ref 0–200)
Basophils Relative: 0.5 %
Eosinophils Absolute: 167 {cells}/uL (ref 15–500)
Eosinophils Relative: 1.5 %
HCT: 40.7 % (ref 38.5–50.0)
Hemoglobin: 13.6 g/dL (ref 13.2–17.1)
MCH: 32.1 pg (ref 27.0–33.0)
MCHC: 33.4 g/dL (ref 32.0–36.0)
MCV: 96 fL (ref 80.0–100.0)
MPV: 9.5 fL (ref 7.5–12.5)
Monocytes Relative: 5.2 %
Neutro Abs: 6926 {cells}/uL (ref 1500–7800)
Neutrophils Relative %: 62.4 %
Platelets: 227 10*3/uL (ref 140–400)
RBC: 4.24 10*6/uL (ref 4.20–5.80)
RDW: 12.4 % (ref 11.0–15.0)
Total Lymphocyte: 30.4 %
WBC: 11.1 10*3/uL — ABNORMAL HIGH (ref 3.8–10.8)

## 2024-03-07 LAB — HEMOGLOBIN A1C
Hgb A1c MFr Bld: 5.5 % (ref <5.7)
Mean Plasma Glucose: 111 mg/dL
eAG (mmol/L): 6.2 mmol/L

## 2024-03-07 LAB — LIPID PANEL
Cholesterol: 156 mg/dL (ref <200)
HDL: 58 mg/dL (ref >=40)
LDL Cholesterol (Calc): 83 mg/dL
Non-HDL Cholesterol (Calc): 98 mg/dL (ref <130)
Total CHOL/HDL Ratio: 2.7 (calc) (ref <5.0)
Triglycerides: 70 mg/dL (ref <150)

## 2024-03-07 LAB — TSH: TSH: 1.86 m[IU]/L (ref 0.40–4.50)

## 2024-03-07 LAB — BRAIN NATRIURETIC PEPTIDE: Brain Natriuretic Peptide: 109 pg/mL — ABNORMAL HIGH (ref <100)

## 2024-03-07 LAB — PSA: PSA: 0.27 ng/mL (ref <=4.00)

## 2024-03-07 MED ORDER — GABAPENTIN 300 MG PO CAPS: 300.0000 mg | ORAL_CAPSULE | Freq: Three times a day (TID) | ORAL | 0 refills | Status: DC

## 2024-03-07 MED ORDER — DULOXETINE HCL 30 MG PO CPEP: 30.0000 mg | ORAL_CAPSULE | Freq: Two times a day (BID) | ORAL | 0 refills | Status: DC

## 2024-03-07 MED ORDER — LOSARTAN POTASSIUM 100 MG PO TABS: 100.0000 mg | ORAL_TABLET | Freq: Every day | ORAL | 0 refills | Status: DC

## 2024-03-07 MED ORDER — RIVAROXABAN 20 MG PO TABS: 20.0000 mg | ORAL_TABLET | Freq: Every day | ORAL | 0 refills | Status: DC

## 2024-03-07 MED ORDER — LORATADINE 10 MG PO TABS: 10.0000 mg | ORAL_TABLET | Freq: Every day | ORAL | 0 refills | Status: DC

## 2024-03-08 ENCOUNTER — Encounter: Payer: Self-pay | Admitting: Family Medicine

## 2024-03-08 DIAGNOSIS — Z7689 Persons encountering health services in other specified circumstances: Secondary | ICD-10-CM | POA: Diagnosis not present

## 2024-03-09 DIAGNOSIS — Z7689 Persons encountering health services in other specified circumstances: Secondary | ICD-10-CM | POA: Diagnosis not present

## 2024-03-11 ENCOUNTER — Encounter (INDEPENDENT_AMBULATORY_CARE_PROVIDER_SITE_OTHER): Payer: Self-pay | Admitting: *Deleted

## 2024-03-11 DIAGNOSIS — I83893 Varicose veins of bilateral lower extremities with other complications: Secondary | ICD-10-CM | POA: Diagnosis not present

## 2024-03-11 DIAGNOSIS — I890 Lymphedema, not elsewhere classified: Secondary | ICD-10-CM | POA: Diagnosis not present

## 2024-03-11 DIAGNOSIS — I8312 Varicose veins of left lower extremity with inflammation: Secondary | ICD-10-CM | POA: Diagnosis not present

## 2024-03-11 DIAGNOSIS — I8311 Varicose veins of right lower extremity with inflammation: Secondary | ICD-10-CM | POA: Diagnosis not present

## 2024-03-11 DIAGNOSIS — R600 Localized edema: Secondary | ICD-10-CM | POA: Diagnosis not present

## 2024-03-11 DIAGNOSIS — Z7689 Persons encountering health services in other specified circumstances: Secondary | ICD-10-CM | POA: Diagnosis not present

## 2024-03-12 DIAGNOSIS — Z7689 Persons encountering health services in other specified circumstances: Secondary | ICD-10-CM | POA: Diagnosis not present

## 2024-03-13 ENCOUNTER — Other Ambulatory Visit: Payer: Self-pay | Admitting: Medical Genetics

## 2024-03-13 ENCOUNTER — Encounter (HOSPITAL_COMMUNITY): Payer: Self-pay

## 2024-03-13 DIAGNOSIS — Z7689 Persons encountering health services in other specified circumstances: Secondary | ICD-10-CM | POA: Diagnosis not present

## 2024-03-14 DIAGNOSIS — Z7689 Persons encountering health services in other specified circumstances: Secondary | ICD-10-CM | POA: Diagnosis not present

## 2024-03-15 DIAGNOSIS — Z7689 Persons encountering health services in other specified circumstances: Secondary | ICD-10-CM | POA: Diagnosis not present

## 2024-03-16 DIAGNOSIS — Z7689 Persons encountering health services in other specified circumstances: Secondary | ICD-10-CM | POA: Diagnosis not present

## 2024-03-18 DIAGNOSIS — Z7689 Persons encountering health services in other specified circumstances: Secondary | ICD-10-CM | POA: Diagnosis not present

## 2024-03-18 DIAGNOSIS — Z419 Encounter for procedure for purposes other than remedying health state, unspecified: Secondary | ICD-10-CM | POA: Diagnosis not present

## 2024-03-19 ENCOUNTER — Encounter: Payer: Self-pay | Admitting: Family Medicine

## 2024-03-19 DIAGNOSIS — I890 Lymphedema, not elsewhere classified: Secondary | ICD-10-CM | POA: Diagnosis not present

## 2024-03-19 DIAGNOSIS — Z7689 Persons encountering health services in other specified circumstances: Secondary | ICD-10-CM | POA: Diagnosis not present

## 2024-03-20 ENCOUNTER — Ambulatory Visit: Admitting: Family Medicine

## 2024-03-20 ENCOUNTER — Encounter: Payer: Self-pay | Admitting: Family Medicine

## 2024-03-20 VITALS — BP 178/80 | HR 51 | Ht 71.0 in | Wt 256.4 lb

## 2024-03-20 DIAGNOSIS — Z7689 Persons encountering health services in other specified circumstances: Secondary | ICD-10-CM | POA: Diagnosis not present

## 2024-03-20 DIAGNOSIS — I10 Essential (primary) hypertension: Secondary | ICD-10-CM | POA: Diagnosis not present

## 2024-03-20 MED ORDER — HYDROCHLOROTHIAZIDE 25 MG PO TABS
25.0000 mg | ORAL_TABLET | Freq: Every day | ORAL | 0 refills | Status: DC
Start: 2024-03-20 — End: 2024-06-03

## 2024-03-20 MED ORDER — ALBUTEROL SULFATE HFA 108 (90 BASE) MCG/ACT IN AERS: 2.0000 | INHALATION_SPRAY | Freq: Four times a day (QID) | RESPIRATORY_TRACT | 0 refills | Status: DC | PRN

## 2024-03-20 NOTE — Assessment & Plan Note (Signed)
 Uncontrolled. Increase hydrochlorothiazide  to 25mg  daily. Awaiting cardiology visit next week and ECHO next month. Symptoms stable. Recommend heart healthy diet such as Mediterranean diet with whole grains, fruits, vegetable, fish, lean meats, nuts, and olive oil. Limit salt. Encouraged moderate walking, 3-5 times/week for 30-50 minutes each session. Aim for at least 150 minutes.week. Goal should be pace of 3 miles/hours, or walking 1.5 miles in 30 minutes. Avoid tobacco products. Avoid excess alcohol. Take medications as prescribed and bring medications and blood pressure log with cuff to each office visit. Seek medical care for chest pain, palpitations, shortness of breath with exertion, dizziness/lightheadedness, vision changes, recurrent headaches, or swelling of extremities. Follow up in 6 weeks (cardiology in 1 week)

## 2024-03-20 NOTE — Progress Notes (Signed)
 Subjective:  HPI: [name] [name] is a 63 y.o. male presenting on 03/20/2024 for No chief complaint on file.   HPI Patient is in today for HTN follow up. Has cardiology visit next week. Has not scheduled with hypertension clinic. Has ECHO scheduled 04/30/2024. Home BP readings have been 130/71 - 195/102. He continues to be seen at methadone clinic and is prescribed 150mg  methadone daily. Continues to abstain from drug use. He reports compliance with medications.  He does endorse SOB worse today than his baseline. Denies chest pain, lightheadedness, dizziness, headaches, vision changes. He does also have lower extremity edema, has been to center for vein restoration, ultrasound confirmed no thrombosis. His lower extremities were wrapped with compression wraps and mechanical compression wraps have been ordered. BNP was 109 2 weeks ago.  HYPERTENSION without Chronic Kidney Disease Hypertension status: uncontrolled  Satisfied with current treatment? yes Duration of hypertension: chronic BP monitoring frequency:  daily BP range: 130/71-195/102 BP medication side effects:  no Medication compliance: excellent compliance Previous BP meds: clonidine , hydrochlorothiazide , losartan , nebivolol , propanolol Aspirin : no Recurrent headaches: no Visual changes: no Palpitations: no Dyspnea: yes Chest pain: no Lower extremity edema: yes Dizzy/lightheaded: no   Review of Systems  All other systems reviewed and are negative.   Relevant past medical history reviewed and updated as indicated.   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   Current Outpatient Medications:    albuterol  (VENTOLIN  HFA) 108 (90 Base) MCG/ACT inhaler, Inhale 2 puffs into the lungs every 6 (six) hours as needed for wheezing or shortness of breath., Disp: 8 g, Rfl: 0   cloNIDine  (CATAPRES ) 0.1 MG tablet, Take 1 tablet (0.1 mg total) by mouth 3 (three) times daily., Disp: 270 tablet, Rfl: 1   DULoxetine  (CYMBALTA ) 30 MG capsule, Take 1 capsule (30 mg total) by mouth 2 (two) times daily., Disp: 180 capsule, Rfl: 0   gabapentin  (NEURONTIN ) 300 MG capsule, Take 1 capsule (300 mg total) by mouth 3 (three) times daily., Disp: 270 capsule, Rfl: 0   loratadine  (CLARITIN ) 10 MG tablet, Take 1 tablet (10 mg total) by mouth daily., Disp: 90 tablet, Rfl: 0   losartan  (COZAAR ) 100 MG tablet, Take 1 tablet (100 mg total) by mouth daily., Disp: 90 tablet, Rfl: 0   nebivolol  (BYSTOLIC ) 5 MG tablet, Take 1 tablet (5 mg total) by mouth daily., Disp: 90 tablet, Rfl: 3   rivaroxaban  (XARELTO ) 20 MG TABS tablet, Take 1 tablet (20 mg total) by mouth daily with supper., Disp: 90 tablet, Rfl: 0   hydrochlorothiazide  (HYDRODIURIL ) 25 MG tablet, Take 1 tablet (25 mg total) by mouth daily., Disp: 90 tablet, Rfl:  0  Allergies  Allergen Reactions   Amlodipine  Swelling    BLE peripheral edema     Objective:   BP (!) 178/80 (BP Location: Left Arm, Patient Position: Sitting, Cuff Size: Large)   Pulse (!) 51   Ht 5\' 11"  (1.803 m)   Wt 256 lb 6 oz (116.3 kg)   SpO2 98%   BMI 35.76 kg/m      03/20/2024    3:11 PM 03/20/2024    2:39 PM 03/06/2024    2:31 PM  Vitals with BMI  Height  5\' 11"  5\' 11"   Weight  256 lbs 6 oz 256 lbs 2 oz  BMI  35.77 35.74  Systolic 178 198 161  Diastolic 80  110 096  Pulse  51 59     Physical Exam Vitals and nursing note reviewed.  Constitutional:      Appearance: Normal appearance. He is normal weight.  HENT:     Head: Normocephalic and atraumatic.  Cardiovascular:     Rate and Rhythm: Normal rate and regular rhythm.     Pulses: Normal pulses.     Heart sounds: Normal heart sounds.  Pulmonary:     Effort: Pulmonary effort is normal.     Breath sounds: Normal breath sounds.  Skin:    General: Skin is warm and dry.     Capillary Refill: Capillary refill takes less than 2 seconds.  Neurological:     General: No focal deficit present.     Mental Status: He is alert and oriented to person, place, and time. Mental status is at baseline.  Psychiatric:        Mood and Affect: Mood normal.        Behavior: Behavior normal.        Thought Content: Thought content normal.        Judgment: Judgment normal.     Assessment & Plan:  Hypertension, unspecified type Assessment & Plan: Uncontrolled. Increase hydrochlorothiazide  to 25mg  daily. Awaiting cardiology visit next week and ECHO next month. Symptoms stable. Recommend heart healthy diet such as Mediterranean diet with whole grains, fruits, vegetable, fish, lean meats, nuts, and olive oil. Limit salt. Encouraged moderate walking, 3-5 times/week for 30-50 minutes each session. Aim for at least 150 minutes.week. Goal should be pace of 3 miles/hours, or walking 1.5 miles in 30 minutes. Avoid tobacco products. Avoid excess alcohol. Take medications as prescribed and bring medications and blood pressure log with cuff to each office visit. Seek medical care for chest pain, palpitations, shortness of breath with exertion, dizziness/lightheadedness, vision changes, recurrent headaches, or swelling of extremities. Follow up in 6 weeks (cardiology in 1 week)   Other orders -     hydroCHLOROthiazide ; Take 1 tablet (25 mg total) by mouth daily.  Dispense: 90 tablet; Refill: 0 -     Albuterol  Sulfate  HFA; Inhale 2 puffs into the lungs every 6 (six) hours as needed for wheezing or shortness of breath.  Dispense: 8 g; Refill: 0     Follow up plan: Return in about 6 weeks (around 05/01/2024).  Jenelle Mis, FNP

## 2024-03-21 DIAGNOSIS — H2513 Age-related nuclear cataract, bilateral: Secondary | ICD-10-CM | POA: Diagnosis not present

## 2024-03-21 DIAGNOSIS — H40033 Anatomical narrow angle, bilateral: Secondary | ICD-10-CM | POA: Diagnosis not present

## 2024-03-21 DIAGNOSIS — Z7689 Persons encountering health services in other specified circumstances: Secondary | ICD-10-CM | POA: Diagnosis not present

## 2024-03-22 ENCOUNTER — Other Ambulatory Visit (HOSPITAL_COMMUNITY)
Admission: RE | Admit: 2024-03-22 | Discharge: 2024-03-22 | Disposition: A | Discharge status: Home / Self Care | Payer: Self-pay | Source: Ambulatory Visit | Attending: Medical Genetics | Admitting: Medical Genetics

## 2024-03-22 DIAGNOSIS — Z7689 Persons encountering health services in other specified circumstances: Secondary | ICD-10-CM | POA: Diagnosis not present

## 2024-03-23 DIAGNOSIS — Z7689 Persons encountering health services in other specified circumstances: Secondary | ICD-10-CM | POA: Diagnosis not present

## 2024-03-25 DIAGNOSIS — Z7689 Persons encountering health services in other specified circumstances: Secondary | ICD-10-CM | POA: Diagnosis not present

## 2024-03-26 DIAGNOSIS — I890 Lymphedema, not elsewhere classified: Secondary | ICD-10-CM | POA: Diagnosis not present

## 2024-03-26 DIAGNOSIS — Z7689 Persons encountering health services in other specified circumstances: Secondary | ICD-10-CM | POA: Diagnosis not present

## 2024-03-27 ENCOUNTER — Ambulatory Visit (HOSPITAL_BASED_OUTPATIENT_CLINIC_OR_DEPARTMENT_OTHER): Admitting: Family

## 2024-03-27 ENCOUNTER — Encounter (HOSPITAL_BASED_OUTPATIENT_CLINIC_OR_DEPARTMENT_OTHER): Payer: Self-pay | Admitting: Family

## 2024-03-27 VITALS — BP 170/75 | HR 52 | Ht 71.0 in | Wt 260.0 lb

## 2024-03-27 DIAGNOSIS — R400 Somnolence: Secondary | ICD-10-CM

## 2024-03-27 DIAGNOSIS — Z720 Tobacco use: Secondary | ICD-10-CM | POA: Diagnosis not present

## 2024-03-27 DIAGNOSIS — I1A0 Resistant hypertension: Secondary | ICD-10-CM

## 2024-03-27 DIAGNOSIS — Z7689 Persons encountering health services in other specified circumstances: Secondary | ICD-10-CM | POA: Diagnosis not present

## 2024-03-27 MED ORDER — VALSARTAN 320 MG PO TABS: 320.0000 mg | ORAL_TABLET | Freq: Every day | ORAL | 1 refills | Status: DC

## 2024-03-27 NOTE — Progress Notes (Unsigned)
 [facility] Initial Assessment:    Date:  03/27/2024   ID:  [name] [name], DOB 1960-12-07, MRN [id number]  PCP:  Jenelle Mis, FNP  Cardiologist:  None  Nephrologist:  Referring MD: Jenelle Mis, FNP   CC: Hypertension  History of Present Illness:    [name] [name] is a 63 y.o. male with a hx of hypertension, COPD, CVA 2016, HLD, substance abuse here to establish care in the [facility].   Reports prior history of heart failure. Echo 09/2015 normal LVEF 55-60%, no RWMA, mildly calcified aortic valve leaflets with no regurgitation or stenosis, diastolic function not commented upon.  [name] [name] was diagnosed with hypertension late 30s and early 53s. It has been difficult to control. he reports tobacco use 1.5 PPD and has been gradually reducing from 3 PPD. He has been in recovery for 8 months with assistance of methadone and congratulated. he eats at home and does follow low sodium diet.   Notes nocturia 5-7 times at night with increased dose diuretic despite taking hydrochlorothiazide  in the morning. His [place] starts at 5 am. Taking his Clonidnie TID 8 hours apart.  Does not snore, wakes up feeling tired. Reports he is sleeping all [place].   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   cloNIDine  (CATAPRES ) 0.1 MG tablet Take 1 tablet (0.1 mg total) by mouth 3 (three) times daily.   DULoxetine  (CYMBALTA ) 30 MG capsule Take 1 capsule (30 mg total) by mouth 2 (two) times daily.   gabapentin  (NEURONTIN ) 300 MG capsule Take 1 capsule (300 mg total) by mouth 3 (three) times daily.   hydrochlorothiazide  (HYDRODIURIL ) 25 MG tablet Take 1 tablet (25 mg total) by mouth daily.   loratadine  (CLARITIN ) 10 MG tablet Take 1 tablet (10 mg total) by mouth daily.   losartan  (COZAAR ) 100 MG tablet Take 1 tablet (100 mg total) by mouth daily.   nebivolol  (BYSTOLIC ) 5 MG tablet Take 1 tablet (5 mg total) by mouth daily.   rivaroxaban  (XARELTO ) 20 MG TABS tablet Take 1 tablet (20 mg total) by mouth daily with supper.     Allergies:   Amlodipine    Social History   Socioeconomic History   Marital status: Divorced    Spouse name: Not on file   Number of children: Not on file   Years of education: Not on file   Highest education level: GED or equivalent  Occupational History  Not on file  Tobacco Use   Smoking status: [place]    Current packs/[place]: 1.50    Types: Cigarettes   Smokeless tobacco: Never   Tobacco comments:    increased to 2ppd  Vaping Use   Vaping status: Never Used  Substance and Sexual Activity   Alcohol use: No    Alcohol/week: 0.0 standard drinks of alcohol    Comment: former   Drug use: Yes    Comment: Heroin-just used 3 days ago   Sexual activity: Yes  Other Topics Concern   Not on file  Social History Narrative   Not on file   Social Drivers of Health   Financial Resource Strain: High Risk (01/10/2024)   Overall Financial Resource Strain (CARDIA)    Difficulty of Paying Living Expenses: Hard  Food Insecurity: Food Insecurity Present (01/10/2024)    Hunger Vital Sign    Worried About Programme researcher, broadcasting/film/video in the Last Year: Often true    Ran Out of Food in the Last Year: Often true  Transportation Needs: Unmet Transportation Needs (01/10/2024)   PRAPARE - Administrator, Civil Service (Medical): No    Lack of Transportation (Non-Medical): Yes  Physical Activity: Unknown (01/10/2024)   Exercise Vital Sign    Days of Exercise per Week: 0 days    Minutes of Exercise per Session: Not on file  Stress: Stress Concern Present (01/10/2024)   [employer] of Occupational Health - Occupational Stress Questionnaire    Feeling of Stress : Rather much  Social Connections: Socially Isolated (01/10/2024)   Social Connection and Isolation Panel [NHANES]    Frequency of Communication with Friends and Family: Once a week    Frequency of Social Gatherings with Friends and Family: Never    Attends Religious Services: Never    Database administrator or Organizations: No    Attends Engineer, structural: Not on file    Marital Status: Divorced     Family History: The patient's family history includes Cancer in his mother; Depression in his sister.  ROS:   Please see the history of present illness.     All other systems reviewed and are negative.  EKGs/Labs/Other Studies Reviewed:         Recent Labs: 07/11/2023: Magnesium 2.0 03/06/2024: ALT 12; Brain Natriuretic Peptide 109; BUN 17; Creat 0.79; Hemoglobin 13.6; Platelets 227; Potassium 5.0; Sodium 139; TSH 1.86   Recent Lipid Panel    Component Value Date/Time   CHOL 156 03/06/2024 1510   TRIG 70 03/06/2024 1510   HDL 58 03/06/2024 1510   CHOLHDL 2.7 03/06/2024 1510   VLDL 24 09/19/2015 0250   LDLCALC 83 03/06/2024 1510   LDLDIRECT 75 01/14/2013 0907    Physical Exam:   VS:  BP (!) 170/75   Pulse (!) 52   Ht 5\' 11"  (1.803 m)   Wt 260 lb (117.9 kg)   SpO2 96%   BMI 36.26 kg/m  , BMI Body mass index is 36.26 kg/m. GENERAL:  Well appearing, overweight HEENT:  Pupils equal round and reactive, fundi not visualized, oral mucosa unremarkable NECK:  No jugular venous distention, waveform within normal limits, carotid upstroke brisk and symmetric, no bruits, no thyromegaly LYMPHATICS:  No cervical adenopathy LUNGS:  Clear to auscultation bilaterally HEART:  RRR.  PMI not displaced or sustained,S1 and S2 within normal limits, no S3, no S4, no clicks, no rubs, no murmurs ABD:  Flat, positive bowel sounds normal in frequency in  pitch, no bruits, no rebound, no guarding, no midline pulsatile mass, no hepatomegaly, no splenomegaly EXT:  2 plus pulses throughout, no edema, no cyanosis no clubbing SKIN:  No rashes no nodules NEURO:  Cranial nerves II through XII grossly intact, motor grossly intact throughout PSYCH:  Cognitively intact, oriented to person place and time   ASSESSMENT/PLAN:    HTN -BP not at goal less than 130/80.  Stop losartan  start valsartan 320 mg daily. Continue Nebivolol  5mg  daily (defer increasing dose due to bradycardia. Continue Clonidine  0.1mg  TID.  Reports he was told by prior substance use provider to remain on clonidine , encouraged him to ask his methadone clinic provider whether it would be appropriate to reduce clonidine  as may be contributory to fatigue.   BMP today and BMP and renin-aldosterone in 7 to 10 days. Plan for renal artery duplex to rule out stenosis. Plan for sleep study, as below.  ?Heart failure -reports previous diagnosis.  Updated echocardiogram already ordered by PCP.  If reduced LVEF noted or diastolic dysfunction plan to optimize GDMT.  Euvolemic on exam.  Hx of CVA / HLD, LDL goal <70 - Not presently on statin. Address at follow up. Per chart notes previously declined. If intolerant, consider PCSK9i  Hx of PE - On long term OAC managed by PCP.   PVD/Lymphedema/Varicose veins -denies claudication symptoms. Follows with [facility].  Daytime somnolence-plan for [facility] home sleep  study  Screening for Secondary Hypertension:     Relevant Labs/Studies:    Latest Ref Rng & Units 03/06/2024    3:10 PM 09/12/2023    2:48 PM 07/11/2023    4:10 AM  Basic Labs  Sodium 135 - 146 mmol/L 139  139  139   Potassium 3.5 - 5.3 mmol/L 5.0  4.6  3.7   Creatinine 0.70 - 1.35 mg/dL 1.61  0.96  0.45        Latest Ref Rng & Units 03/06/2024    3:10 PM 10/26/2015    2:43 PM  Thyroid   TSH 0.40 - 4.50 mIU/L 1.86  0.648              Disposition:    FU with MD/APP/PharmD in 1 month    Medication Adjustments/Labs and Tests Ordered: Current medicines are reviewed at length with the patient today.  Concerns regarding medicines are outlined above.  No orders of the defined types were placed in this encounter.  No orders of the defined types were placed in this encounter.    Signed, Clearnce Curia, NP  03/27/2024 3:41 PM    [facility]

## 2024-03-27 NOTE — Patient Instructions (Addendum)
 Medication Instructions:  STOP Losartan   START Valsartan 320mg  daily   Recommend asking your Methadone clinic whether it would be appropriate to reduce your Clonidine  to twice per [place] or gradually discontinue as it can cause fatigue.   Labwork: Your physician recommends that you return for lab work today: BMET  Your physician recommends that you return for lab work in 7-10 days for BMET, renin-aldosterone   Testing/Procedures: Your physician has requested that you have a renal artery duplex. During this test, an ultrasound is used to evaluate blood flow to the kidneys. Allow one hour for this exam. Do not eat after midnight the [place] before and avoid carbonated beverages. Take your medications as you usually do.   Your physician has requested that you have an echocardiogram. Echocardiography is a painless test that uses sound waves to create images of your heart. It provides your doctor with information about the size and shape of your heart and how well your heart's chambers and valves are working. This procedure takes approximately one hour. There are no restrictions for this procedure. Please do NOT wear cologne, perfume, aftershave, or lotions (deodorant is allowed). Please arrive 15 minutes prior to your appointment time.  Please note: We ask at that you not bring children with you during ultrasound (echo/ vascular) testing. Due to room size and safety concerns, children are not allowed in the ultrasound rooms during exams. Our front office staff cannot provide observation of children in our lobby area while testing is being conducted. An adult accompanying a patient to their appointment will only be allowed in the ultrasound room at the discretion of the ultrasound technician under special circumstances. We apologize for any inconvenience.   Follow-Up: Please follow up in 1 month in [facility] with Dr. Theodis Fiscal, Neomi Banks, NP or Donivan Furry PharmD    Special Instructions:     We will send message in 1 week to check in on BP   WatchPAT?  Is a FDA cleared portable home sleep study test that uses a watch and 3 points of contact to monitor 7 different channels, including your heart rate, oxygen saturations, body position, snoring, and chest motion.  The study is easy to use from the comfort of your own home and accurately detect sleep apnea.  Before bed, you attach the chest sensor, attached the sleep apnea bracelet to your nondominant hand, and attach the finger probe.  After the study, the raw data is downloaded from the watch and scored for apnea events.   For more information: [email or web address]  Patient Testing Instructions:  Do not put battery into the device until bedtime when you are ready to begin the test. Please call the support number if you need assistance after following the instructions below: 24 hour support line- [phone] or [facility] support at [phone] (option 2)  Download the IntelWatchPAT One" app through the google play store or App Store  Be sure to turn on or enable access to bluetooth in settlings on your smartphone/ device  Make sure no other bluetooth devices are on and within the vicinity of your smartphone/ device and WatchPAT watch during testing.  Make sure to leave your smart phone/ device plugged in and charging all night.  When ready for bed:  Follow the instructions step by step in the WatchPAT One App to activate the testing device. For additional instructions, including video instruction, visit the WatchPAT One video on Youtube. You can search for WatchPat One within Youtube (video is 4 minutes  and 18 seconds) or enter: https://[email or web address]?[email or web address]_[email or web address] Please note: You will be prompted to enter a Pin to connect via bluetooth when starting the test. The PIN will be assigned to you when you receive the test.  The device is disposable, but it recommended that you retain the device until you receive a call  letting you know the study has been received and the results have been interpreted.  We will let you know if the study did not transmit to us  properly after the test is completed. You do not need to call us  to confirm the receipt of the test.  Please complete the test within 48 hours of receiving PIN.   Frequently Asked Questions:  What is Watch Deatra Face one?  A single use fully disposable home sleep apnea testing device and will not need to be returned after completion.  What are the requirements to use WatchPAT one?  The be able to have a successful watchpat one sleep study, you should have your Watch pat one device, your smart phone, watch pat one app, your PIN number and Internet access What type of phone do I need?  You should have a smart phone that uses Android 5.1 and above or any Iphone with IOS 10 and above How can I download the WatchPAT one app?  Based on your device type search for WatchPAT one app either in google play for android devices or APP store for Iphone's Where will I get my PIN for the study?  Your PIN will be provided by your physician's office. It is used for authentication and if you lose/forget your PIN, please reach out to your providers office.  I do not have Internet at home. Can I do WatchPAT one study?  WatchPAT One needs Internet connection throughout the night to be able to transmit the sleep data. You can use your home/local internet or your cellular's data package. However, it is always recommended to use home/local Internet. It is estimated that between 20MB-30MB will be used with each study.However, the application will be looking for space in the phone to start the study.  What happens if I lose internet or bluetooth connection?  During the internet disconnection, your phone will not be able to transmit the sleep data. All the data, will be stored in your phone. As soon as the internet connection is back on, the phone will being sending the sleep  data. During the bluetooth disconnection, WatchPAT one will not be able to to send the sleep data to your phone. Data will be kept in the WatchPAT one until two devices have bluetooth connection back on. As soon as the connection is back on, WatchPAT one will send the sleep data to the phone.  How long do I need to wear the WatchPAT one?  After you start the study, you should wear the device at least 6 hours.  How far should I keep my phone from the device?  During the night, your phone should be within 15 feet.  What happens if I leave the room for restroom or other reasons?  Leaving the room for any reason will not cause any problem. As soon as your get back to the room, both devices will reconnect and will continue to send the sleep data. Can I use my phone during the sleep study?  Yes, you can use your phone as usual during the study. But it is recommended to put your watchpat one on when you are ready to go to bed.  How will I get my study results?  A soon as you completed your study, your sleep data will be sent to the provider. They will then share the results with you when they are ready.

## 2024-03-28 ENCOUNTER — Ambulatory Visit (HOSPITAL_BASED_OUTPATIENT_CLINIC_OR_DEPARTMENT_OTHER): Payer: Self-pay | Admitting: Family

## 2024-03-28 ENCOUNTER — Telehealth: Payer: Self-pay

## 2024-03-28 DIAGNOSIS — I1A0 Resistant hypertension: Secondary | ICD-10-CM

## 2024-03-28 DIAGNOSIS — R400 Somnolence: Secondary | ICD-10-CM

## 2024-03-28 DIAGNOSIS — Z7689 Persons encountering health services in other specified circumstances: Secondary | ICD-10-CM | POA: Diagnosis not present

## 2024-03-28 LAB — BASIC METABOLIC PANEL WITH GFR
BUN/Creatinine Ratio: 16 (ref 10–24)
BUN: 15 mg/dL (ref 8–27)
CO2: 25 mmol/L (ref 20–29)
Calcium: 9.1 mg/dL (ref 8.6–10.2)
Chloride: 99 mmol/L (ref 96–106)
Creatinine, Ser: 0.93 mg/dL (ref 0.76–1.27)
Glucose: 81 mg/dL (ref 70–99)
Potassium: 4.5 mmol/L (ref 3.5–5.2)
Sodium: 138 mmol/L (ref 134–144)
eGFR: 93 mL/min/{1.73_m2} (ref >59)

## 2024-03-28 NOTE — Telephone Encounter (Signed)
**Note De-Identified Germani Gavilanes Obfuscation** Ordering provider: Neomi Banks, NP Associated diagnoses: Somnolence-R40.0 and Fatigue-R53.83  WatchPAT PA obtained on 03/28/2024 by Archie Shea, Isabella Mao, LPN. Authorization: Per the [facility] provider portal: CPT Code: 16109 ([facility]-HST)-Approved from 03/28/2024 until 06/26/2024 Authorization Number: [id number] Case Number: [id number]  Patient notified of PIN (1234) on 03/28/2024 Laasia Arcos Notification Method: MyChart message. I did call the pt but got no answer so I left a message on his VM (Ok per [facility]) advising him of the [facility] One-HST Device Pin # of "1234" and that it is ok for him to proceed with his home sleep test as [facility] has approved coverage. I did leave my name and the office phone number so he can call us  back if he has any questions or concerns.  Phone note routed to covering staff for follow-up.

## 2024-03-29 DIAGNOSIS — Z7689 Persons encountering health services in other specified circumstances: Secondary | ICD-10-CM | POA: Diagnosis not present

## 2024-03-30 DIAGNOSIS — Z7689 Persons encountering health services in other specified circumstances: Secondary | ICD-10-CM | POA: Diagnosis not present

## 2024-03-31 ENCOUNTER — Encounter (HOSPITAL_BASED_OUTPATIENT_CLINIC_OR_DEPARTMENT_OTHER): Payer: Self-pay

## 2024-03-31 ENCOUNTER — Encounter (INDEPENDENT_AMBULATORY_CARE_PROVIDER_SITE_OTHER): Admitting: Cardiology

## 2024-03-31 DIAGNOSIS — G4733 Obstructive sleep apnea (adult) (pediatric): Secondary | ICD-10-CM

## 2024-04-01 ENCOUNTER — Encounter (HOSPITAL_BASED_OUTPATIENT_CLINIC_OR_DEPARTMENT_OTHER): Payer: Self-pay | Admitting: Family

## 2024-04-02 DIAGNOSIS — Z7689 Persons encountering health services in other specified circumstances: Secondary | ICD-10-CM | POA: Diagnosis not present

## 2024-04-02 DIAGNOSIS — I890 Lymphedema, not elsewhere classified: Secondary | ICD-10-CM | POA: Diagnosis not present

## 2024-04-02 LAB — GENECONNECT MOLECULAR SCREEN: Genetic Analysis Overall Interpretation: NEGATIVE

## 2024-04-02 NOTE — Telephone Encounter (Signed)
 Please advise as I am unsure of the answer

## 2024-04-03 ENCOUNTER — Encounter (HOSPITAL_BASED_OUTPATIENT_CLINIC_OR_DEPARTMENT_OTHER): Payer: Self-pay

## 2024-04-03 DIAGNOSIS — Z7689 Persons encountering health services in other specified circumstances: Secondary | ICD-10-CM | POA: Diagnosis not present

## 2024-04-04 DIAGNOSIS — Z7689 Persons encountering health services in other specified circumstances: Secondary | ICD-10-CM | POA: Diagnosis not present

## 2024-04-05 DIAGNOSIS — I890 Lymphedema, not elsewhere classified: Secondary | ICD-10-CM | POA: Diagnosis not present

## 2024-04-05 DIAGNOSIS — Z7689 Persons encountering health services in other specified circumstances: Secondary | ICD-10-CM | POA: Diagnosis not present

## 2024-04-06 DIAGNOSIS — Z7689 Persons encountering health services in other specified circumstances: Secondary | ICD-10-CM | POA: Diagnosis not present

## 2024-04-08 ENCOUNTER — Ambulatory Visit: Attending: Family

## 2024-04-08 DIAGNOSIS — R400 Somnolence: Secondary | ICD-10-CM

## 2024-04-08 DIAGNOSIS — Z7689 Persons encountering health services in other specified circumstances: Secondary | ICD-10-CM | POA: Diagnosis not present

## 2024-04-08 DIAGNOSIS — I1A0 Resistant hypertension: Secondary | ICD-10-CM

## 2024-04-08 NOTE — Procedures (Signed)
   SLEEP STUDY REPORT Patient Information Study Date: 03/31/2024 Patient Name: [name] [name] Patient ID: [id number] Birth Date: 1961/01/24 Age: 63 Gender: Male BMI: 36.4 (W=260 lb, H=5' 11'') Referring Physician: Neomi Banks, NP  TEST DESCRIPTION: Home sleep apnea testing was completed using the WatchPat, a Type 1 device, utilizing peripheral arterial tonometry (PAT), chest movement, actigraphy, pulse oximetry, pulse rate, body position and snore. AHI was calculated with apnea and hypopnea using valid sleep time as the denominator. RDI includes apneas, hypopneas, and RERAs. The data acquired and the scoring of sleep and all associated events were performed in accordance with the recommended standards and specifications as outlined in the AASM Manual for the Scoring of Sleep and Associated Events 2.2.0 (2015).  FINDINGS:  1. Severe Obstructive Sleep Apnea with AHI 35.5/hr.  2. No Central Sleep Apnea with pAHIc 4.1/hr.  3. Oxygen desaturations as low as 87%.  4. Mild to moderate snoring was present. O2 sats were < 88% for 0.7 min.  5. Total sleep time was 4 hrs and 8 min.  6. 20.2% of total sleep time was spent in REM sleep.  7. Shortened sleep onset latency at 5 min.  8. Prolonged REM sleep onset latency at 184 min.  9. Total awakenings were 12. 10. Arrhythmia detection: None.  DIAGNOSIS: Severe Obstructive Sleep Apnea (G47.33)  RECOMMENDATIONS: 1. Clinical correlation of these findings is necessary. The decision to treat obstructive sleep apnea (OSA) is usually based on the presence of apnea symptoms or the presence of associated medical conditions such as Hypertension, Congestive Heart Failure, Atrial Fibrillation or Obesity. The most common symptoms of OSA are snoring, gasping for breath while sleeping, daytime sleepiness and fatigue. 2. Initiating apnea therapy is recommended given the presence of symptoms and/or associated conditions. Recommend proceeding with one of the  following:  a. Auto-CPAP therapy with a pressure range of 5-20cm H2O.  b. An oral appliance (OA) that can be obtained from certain dentists with expertise in sleep medicine. These are primarily of use in non-obese patients with mild and moderate disease.  c. An ENT consultation which may be useful to look for specific causes of obstruction and possible treatment options.  d. If patient is intolerant to PAP therapy, consider referral to ENT for evaluation for hypoglossal nerve stimulator. 3. Close follow-up is necessary to ensure success with CPAP or oral appliance therapy for maximum benefit . 4. A follow-up oximetry study on CPAP is recommended to assess the adequacy of therapy and determine the need for supplemental oxygen or the potential need for Bi-level therapy. An arterial blood gas to determine the adequacy of baseline ventilation and oxygenation should also be considered. 5. Healthy sleep recommendations include: adequate nightly sleep (normal 7-9 hrs/night), avoidance of caffeine  after noon and alcohol near bedtime, and maintaining a sleep environment that is cool, dark and quiet. 6. Weight loss for overweight patients is recommended. Even modest amounts of weight loss can significantly improve the severity of sleep apnea. 7. Snoring recommendations include: weight loss where appropriate, side sleeping, and avoidance of alcohol before bed. 8. Operation of motor vehicle should be avoided when sleepy.  Signature: Gaylyn Keas, MD; [facility]; Diplomat, American Board of Sleep Medicine Electronically Signed: 04/08/2024 11:15:58 AM

## 2024-04-09 DIAGNOSIS — Z7689 Persons encountering health services in other specified circumstances: Secondary | ICD-10-CM | POA: Diagnosis not present

## 2024-04-10 DIAGNOSIS — Z7689 Persons encountering health services in other specified circumstances: Secondary | ICD-10-CM | POA: Diagnosis not present

## 2024-04-11 ENCOUNTER — Telehealth: Payer: Self-pay

## 2024-04-11 DIAGNOSIS — I890 Lymphedema, not elsewhere classified: Secondary | ICD-10-CM | POA: Diagnosis not present

## 2024-04-11 DIAGNOSIS — Z7689 Persons encountering health services in other specified circumstances: Secondary | ICD-10-CM | POA: Diagnosis not present

## 2024-04-11 NOTE — Telephone Encounter (Signed)
-----   Message from Gaylyn Keas sent at 04/08/2024 11:17 AM EDT ----- Please let patient know that they have sleep apnea.  Recommend therapeutic CPAP titration for treatment of patient's sleep disordered breathing.

## 2024-04-11 NOTE — Telephone Encounter (Signed)
 Left VM with callback number for patient to receive sleep study results and recommendations.

## 2024-04-12 DIAGNOSIS — Z7689 Persons encountering health services in other specified circumstances: Secondary | ICD-10-CM | POA: Diagnosis not present

## 2024-04-13 DIAGNOSIS — Z7689 Persons encountering health services in other specified circumstances: Secondary | ICD-10-CM | POA: Diagnosis not present

## 2024-04-15 DIAGNOSIS — Z7689 Persons encountering health services in other specified circumstances: Secondary | ICD-10-CM | POA: Diagnosis not present

## 2024-04-16 DIAGNOSIS — Z7689 Persons encountering health services in other specified circumstances: Secondary | ICD-10-CM | POA: Diagnosis not present

## 2024-04-17 DIAGNOSIS — Z7689 Persons encountering health services in other specified circumstances: Secondary | ICD-10-CM | POA: Diagnosis not present

## 2024-04-18 ENCOUNTER — Ambulatory Visit (HOSPITAL_BASED_OUTPATIENT_CLINIC_OR_DEPARTMENT_OTHER): Payer: Self-pay | Admitting: Family

## 2024-04-18 DIAGNOSIS — I890 Lymphedema, not elsewhere classified: Secondary | ICD-10-CM | POA: Diagnosis not present

## 2024-04-18 DIAGNOSIS — Z419 Encounter for procedure for purposes other than remedying health state, unspecified: Secondary | ICD-10-CM | POA: Diagnosis not present

## 2024-04-18 DIAGNOSIS — L0390 Cellulitis, unspecified: Secondary | ICD-10-CM | POA: Diagnosis not present

## 2024-04-18 DIAGNOSIS — Z7689 Persons encountering health services in other specified circumstances: Secondary | ICD-10-CM | POA: Diagnosis not present

## 2024-04-19 ENCOUNTER — Other Ambulatory Visit: Payer: Self-pay

## 2024-04-19 ENCOUNTER — Emergency Department (HOSPITAL_COMMUNITY)

## 2024-04-19 ENCOUNTER — Encounter (HOSPITAL_COMMUNITY): Payer: Self-pay

## 2024-04-19 ENCOUNTER — Emergency Department (HOSPITAL_COMMUNITY): Admission: EM | Admit: 2024-04-19 | Discharge: 2024-04-19 | Disposition: A | Discharge status: Home / Self Care

## 2024-04-19 ENCOUNTER — Emergency Department (HOSPITAL_BASED_OUTPATIENT_CLINIC_OR_DEPARTMENT_OTHER)

## 2024-04-19 ENCOUNTER — Telehealth: Payer: Self-pay

## 2024-04-19 DIAGNOSIS — Z86718 Personal history of other venous thrombosis and embolism: Secondary | ICD-10-CM | POA: Diagnosis not present

## 2024-04-19 DIAGNOSIS — Z7689 Persons encountering health services in other specified circumstances: Secondary | ICD-10-CM | POA: Diagnosis not present

## 2024-04-19 DIAGNOSIS — I110 Hypertensive heart disease with heart failure: Secondary | ICD-10-CM | POA: Diagnosis not present

## 2024-04-19 DIAGNOSIS — Z79899 Other long term (current) drug therapy: Secondary | ICD-10-CM | POA: Diagnosis not present

## 2024-04-19 DIAGNOSIS — R400 Somnolence: Secondary | ICD-10-CM

## 2024-04-19 DIAGNOSIS — G4733 Obstructive sleep apnea (adult) (pediatric): Secondary | ICD-10-CM

## 2024-04-19 DIAGNOSIS — R600 Localized edema: Secondary | ICD-10-CM | POA: Insufficient documentation

## 2024-04-19 DIAGNOSIS — Z7901 Long term (current) use of anticoagulants: Secondary | ICD-10-CM | POA: Insufficient documentation

## 2024-04-19 DIAGNOSIS — I1A0 Resistant hypertension: Secondary | ICD-10-CM

## 2024-04-19 DIAGNOSIS — R0602 Shortness of breath: Secondary | ICD-10-CM | POA: Diagnosis not present

## 2024-04-19 DIAGNOSIS — J4489 Other specified chronic obstructive pulmonary disease: Secondary | ICD-10-CM | POA: Insufficient documentation

## 2024-04-19 DIAGNOSIS — R609 Edema, unspecified: Secondary | ICD-10-CM | POA: Diagnosis not present

## 2024-04-19 DIAGNOSIS — R0609 Other forms of dyspnea: Secondary | ICD-10-CM | POA: Diagnosis not present

## 2024-04-19 DIAGNOSIS — M7989 Other specified soft tissue disorders: Secondary | ICD-10-CM | POA: Diagnosis not present

## 2024-04-19 DIAGNOSIS — R52 Pain, unspecified: Secondary | ICD-10-CM | POA: Diagnosis not present

## 2024-04-19 DIAGNOSIS — F17200 Nicotine dependence, unspecified, uncomplicated: Secondary | ICD-10-CM | POA: Insufficient documentation

## 2024-04-19 DIAGNOSIS — Z4801 Encounter for change or removal of surgical wound dressing: Secondary | ICD-10-CM | POA: Diagnosis not present

## 2024-04-19 DIAGNOSIS — Z859 Personal history of malignant neoplasm, unspecified: Secondary | ICD-10-CM | POA: Insufficient documentation

## 2024-04-19 DIAGNOSIS — I2510 Atherosclerotic heart disease of native coronary artery without angina pectoris: Secondary | ICD-10-CM | POA: Diagnosis not present

## 2024-04-19 DIAGNOSIS — Z4800 Encounter for change or removal of nonsurgical wound dressing: Secondary | ICD-10-CM | POA: Diagnosis not present

## 2024-04-19 DIAGNOSIS — I509 Heart failure, unspecified: Secondary | ICD-10-CM | POA: Insufficient documentation

## 2024-04-19 DIAGNOSIS — I700 Atherosclerosis of aorta: Secondary | ICD-10-CM | POA: Diagnosis not present

## 2024-04-19 DIAGNOSIS — R5383 Other fatigue: Secondary | ICD-10-CM | POA: Diagnosis not present

## 2024-04-19 DIAGNOSIS — J439 Emphysema, unspecified: Secondary | ICD-10-CM | POA: Diagnosis not present

## 2024-04-19 DIAGNOSIS — Z5189 Encounter for other specified aftercare: Secondary | ICD-10-CM

## 2024-04-19 LAB — COMPREHENSIVE METABOLIC PANEL WITH GFR
ALT: 12 U/L (ref 0–44)
AST: 12 U/L — ABNORMAL LOW (ref 15–41)
Albumin: 2.7 g/dL — ABNORMAL LOW (ref 3.5–5.0)
Alkaline Phosphatase: 81 U/L (ref 38–126)
Anion gap: 8 (ref 5–15)
BUN: 15 mg/dL (ref 8–23)
CO2: 26 mmol/L (ref 22–32)
Calcium: 8.3 mg/dL — ABNORMAL LOW (ref 8.9–10.3)
Chloride: 102 mmol/L (ref 98–111)
Creatinine, Ser: 0.83 mg/dL (ref 0.61–1.24)
GFR, Estimated: >60 mL/min (ref >60)
Glucose, Bld: 109 mg/dL — ABNORMAL HIGH (ref 70–99)
Potassium: 4 mmol/L (ref 3.5–5.1)
Sodium: 136 mmol/L (ref 135–145)
Total Bilirubin: 0.4 mg/dL (ref 0.0–1.2)
Total Protein: 5.9 g/dL — ABNORMAL LOW (ref 6.5–8.1)

## 2024-04-19 LAB — CBC WITH DIFFERENTIAL/PLATELET
Abs Immature Granulocytes: 0.05 10*3/uL (ref 0.00–0.07)
Basophils Absolute: 0.1 10*3/uL (ref 0.0–0.1)
Basophils Relative: 1 %
Eosinophils Absolute: 0.2 10*3/uL (ref 0.0–0.5)
Eosinophils Relative: 2 %
HCT: 35.8 % — ABNORMAL LOW (ref 39.0–52.0)
Hemoglobin: 12.1 g/dL — ABNORMAL LOW (ref 13.0–17.0)
Immature Granulocytes: 1 %
Lymphocytes Relative: 27 %
Lymphs Abs: 2.6 10*3/uL (ref 0.7–4.0)
MCH: 33.3 pg (ref 26.0–34.0)
MCHC: 33.8 g/dL (ref 30.0–36.0)
MCV: 98.6 fL (ref 80.0–100.0)
Monocytes Absolute: 0.6 10*3/uL (ref 0.1–1.0)
Monocytes Relative: 6 %
Neutro Abs: 5.9 10*3/uL (ref 1.7–7.7)
Neutrophils Relative %: 63 %
Platelets: 221 10*3/uL (ref 150–400)
RBC: 3.63 MIL/uL — ABNORMAL LOW (ref 4.22–5.81)
RDW: 13.6 % (ref 11.5–15.5)
WBC: 9.4 10*3/uL (ref 4.0–10.5)
nRBC: 0 % (ref 0.0–0.2)

## 2024-04-19 LAB — BRAIN NATRIURETIC PEPTIDE: B Natriuretic Peptide: 46.2 pg/mL (ref 0.0–100.0)

## 2024-04-19 LAB — I-STAT CG4 LACTIC ACID, ED
Lactic Acid, Venous: 0.7 mmol/L (ref 0.5–1.9)
Lactic Acid, Venous: 0.7 mmol/L (ref 0.5–1.9)

## 2024-04-19 MED ORDER — IOHEXOL 350 MG/ML SOLN
75.0000 mL | Freq: Once | INTRAVENOUS | Status: AC | PRN
  Administered 2024-04-19: 75 mL via INTRAVENOUS

## 2024-04-19 MED ORDER — SULFAMETHOXAZOLE-TRIMETHOPRIM 800-160 MG PO TABS: 1.0000 | ORAL_TABLET | Freq: Two times a day (BID) | ORAL | 0 refills | Status: AC

## 2024-04-19 MED ORDER — CEPHALEXIN 500 MG PO CAPS: 500.0000 mg | ORAL_CAPSULE | Freq: Four times a day (QID) | ORAL | 0 refills | Status: DC

## 2024-04-19 NOTE — ED Notes (Signed)
 Pt ambulated independently to restroom with cane. Pt walked out and there was blood in the floor. IV was removed, pt unsure as to what caused the IV to come out.

## 2024-04-19 NOTE — Progress Notes (Signed)
 Left lower extremity venous  has been completed. Refer to [facility] under chart review to view preliminary results.   04/19/2024  9:57 AM Shakeitha Umbaugh, Hollace Lund

## 2024-04-19 NOTE — Telephone Encounter (Signed)
-----   Message from Gaylyn Keas sent at 04/08/2024 11:17 AM EDT ----- Please let patient know that they have sleep apnea.  Recommend therapeutic CPAP titration for treatment of patient's sleep disordered breathing.

## 2024-04-19 NOTE — ED Provider Notes (Signed)
 [facility] Provider Note   CSN: [id number] Arrival date & time: 04/19/24  3151     Patient presents with: Wound Check   [name] [name] is a 63 y.o. male past medical history of CHF, COPD, lymphedema, PE/DVT on Xarelto  presents with complaints of worsening left lower extremity swelling and pain.  The symptoms have been worsening over the past few weeks.  Notes that towards the end of last month he started develop a wound on the left lower extremity.  He was treated with a course of doxycycline .  However he noted no significant improvement of his symptoms.  He was referred here today by his lymphedema specialist.  Patient denies any fevers. Does endorse chills. Does endorse generalized fatigue that is chronic.  Recent diagnosis with OSA.  Denies any chest pain but does endorse DOE which is chronic.   No new cough or congestion.   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 Authorizing Provider  cephALEXin  (KEFLEX ) 500 MG capsule Take 1 capsule (500 mg total) by mouth 4 (four) times daily. 04/19/24  Yes Felicie Horning, PA-C  sulfamethoxazole -trimethoprim  (BACTRIM  DS) 800-160 MG tablet Take 1 tablet by mouth 2 (two) times daily  for 7 days. 04/19/24 04/26/24 Yes Felicie Horning, PA-C  albuterol  (VENTOLIN  HFA) 108 (90 Base) MCG/ACT inhaler Inhale 2 puffs into the lungs every 6 (six) hours as needed for wheezing or shortness of breath. 03/20/24   Jenelle Mis, FNP  cloNIDine  (CATAPRES ) 0.1 MG tablet Take 1 tablet (0.1 mg total) by mouth 3 (three) times daily. 02/01/24   Jenelle Mis, FNP  DULoxetine  (CYMBALTA ) 30 MG capsule Take 1 capsule (30 mg total) by mouth 2 (two) times daily. 03/07/24   Jenelle Mis, FNP  gabapentin  (NEURONTIN ) 300 MG capsule Take 1 capsule (300 mg total) by mouth 3 (three) times daily. 03/07/24   Jenelle Mis, FNP  hydrochlorothiazide  (HYDRODIURIL ) 25 MG tablet Take 1 tablet (25 mg total) by mouth daily. 03/20/24   Jenelle Mis, FNP  loratadine  (CLARITIN ) 10 MG tablet Take 1 tablet (10 mg total) by mouth daily. 03/07/24   Jenelle Mis, FNP  nebivolol  (BYSTOLIC ) 5 MG tablet Take 1 tablet (5 mg total) by mouth daily. 02/01/24   Jenelle Mis, FNP  rivaroxaban  (XARELTO ) 20 MG TABS tablet Take 1 tablet (20 mg total) by mouth daily with supper. 03/07/24   Jenelle Mis, FNP  valsartan  (DIOVAN ) 320 MG tablet Take 1 tablet (320 mg total)  by mouth daily. 03/27/24   Clearnce Curia, NP    Allergies: Amlodipine     Review of Systems  Skin:  Positive for wound.    Updated Vital Signs BP (!) 168/97   Pulse 60   Temp 98.6 F (37 C)   Resp 15   Ht 5' 11 (1.803 m)   Wt 115.7 kg   SpO2 95%   BMI 35.57 kg/m   Physical Exam Vitals and nursing note reviewed.  Constitutional:      General: He is not in acute distress.    Appearance: He is well-developed.  HENT:     Head: Normocephalic and atraumatic.   Eyes:     Conjunctiva/sclera: Conjunctivae normal.    Cardiovascular:     Rate and Rhythm: Normal rate and regular rhythm.     Heart sounds: No murmur heard. Pulmonary:     Effort: Pulmonary effort is normal. No respiratory distress.     Breath sounds: Normal breath sounds.   Abdominal:     Palpations: Abdomen is soft.     Tenderness: There is no abdominal tenderness.   Musculoskeletal:        General: Swelling present.     Cervical back: Neck supple.     Comments: Bilateral 2+ pitting edema, appears to be worse on the left, there is a 6 cm wound on the left lower extremity with granulation and surrounding erythema, no warmth, induration or fluctuance.  DP pulses 2+   Skin:    General: Skin is warm and dry.     Capillary Refill: Capillary refill takes less than 2 seconds.   Neurological:     Mental Status: He is alert.   Psychiatric:        Mood and Affect: Mood normal.        (all labs ordered are listed, but only abnormal results are displayed) Labs Reviewed  CBC WITH DIFFERENTIAL/PLATELET - Abnormal; Notable for the following components:      Result Value   RBC 3.63 (*)    Hemoglobin 12.1 (*)    HCT 35.8 (*)    All other components within normal limits  COMPREHENSIVE METABOLIC PANEL WITH GFR - Abnormal; Notable for the following components:   Glucose, Bld 109 (*)    Calcium  8.3 (*)    Total Protein 5.9 (*)    Albumin 2.7 (*)    AST 12 (*)    All other components within normal limits  BRAIN NATRIURETIC PEPTIDE  I-STAT CG4 LACTIC ACID, ED  I-STAT CG4 LACTIC ACID, ED    EKG: None  Radiology: CT Angio Chest PE W and/or Wo Contrast Result Date: 04/19/2024 CLINICAL DATA:  Pulmonary embolism (PE) suspected, high prob. Wound check. EXAM: CT ANGIOGRAPHY CHEST WITH CONTRAST TECHNIQUE: Multidetector CT imaging of the chest was performed using the standard protocol during bolus administration of intravenous contrast. Multiplanar CT image reconstructions and MIPs were obtained to evaluate the vascular anatomy. RADIATION DOSE REDUCTION: This exam was performed according to the departmental dose-optimization program which includes automated exposure control, adjustment of the mA and/or kV according to patient size and/or use of iterative  reconstruction technique. CONTRAST:  75mL OMNIPAQUE  IOHEXOL  350 MG/ML SOLN COMPARISON:  CT scan chest from 11/22/2021. FINDINGS: Cardiovascular: No evidence of embolism to the proximal subsegmental pulmonary artery level. Normal cardiac size. No pericardial effusion. No aortic aneurysm. There are coronary artery calcifications, in keeping with coronary artery disease. There are also mild peripheral atherosclerotic vascular calcifications of thoracic aorta and its  major branches. Mediastinum/Nodes: Visualized thyroid gland appears grossly unremarkable. No solid / cystic mediastinal masses. The esophagus is nondistended precluding optimal assessment. There is mild circumferential thickening of the lower thoracic esophagus, which is most likely seen in the settings of chronic gastroesophageal reflux disease versus esophagitis. There are few mildly prominent mediastinal and hilar lymph nodes, which do not meet the size criteria for lymphadenopathy and appear grossly similar to the prior study, favoring benign etiology. No axillary lymphadenopathy by size criteria. Lungs/Pleura: The central tracheo-bronchial tree is patent. Mild upper lobe predominant centrilobular and paraseptal emphysematous changes noted. No mass or consolidation. No pleural effusion or pneumothorax. No suspicious lung nodules. Upper Abdomen: Visualized upper abdominal viscera within normal limits. Musculoskeletal: The visualized soft tissues of the chest wall are grossly unremarkable. No suspicious osseous lesions. There are moderate multilevel degenerative changes in the visualized spine. Partially seen lower cervical spinal fixation hardware. Review of the MIP images confirms the above findings. IMPRESSION: 1. No embolism to the proximal subsegmental pulmonary artery level. No lung mass, consolidation, pleural effusion or pneumothorax. 2. Multiple other nonacute observations, as described above. Aortic Atherosclerosis (ICD10-I70.0) and Emphysema  (ICD10-J43.9). Electronically Signed   By: Beula Brunswick M.D.   On: 04/19/2024 11:21   VAS US  LOWER EXTREMITY VENOUS (DVT) (7a-7p) Result Date: 04/19/2024  Lower Venous DVT Study Patient Name:  SOULEYMANE SAIKI  Date of Exam:   04/19/2024 Medical Rec #: [id number]   Accession #:    [id number] Date of Birth: 02/18/1961   Patient Gender: M Patient Age:   29 years Exam Location:  [facility] Procedure:      VAS US  LOWER EXTREMITY VENOUS (DVT) Referring Phys: Royston Cornea Trejuan Matherne --------------------------------------------------------------------------------  Indications: Pain, Swelling, Edema, and History of calf DVT on 07/11/23.  Anticoagulation: Xarelto . Limitations: Poor ultrasound/tissue interface and body habitus. Comparison Study: 07/11/23 - positive DVT in left calf from outside facility. Performing Technologist: Franky Ivanoff Sturdivant-Jones RDMS, RVT  Examination Guidelines: A complete evaluation includes B-mode imaging, spectral Doppler, color Doppler, and power Doppler as needed of all accessible portions of each vessel. Bilateral testing is considered an integral part of a complete examination. Limited examinations for reoccurring indications may be performed as noted. The reflux portion of the exam is performed with the patient in reverse Trendelenburg.  +-----+---------------+---------+-----------+----------+--------------+ RIGHTCompressibilityPhasicitySpontaneityPropertiesThrombus Aging +-----+---------------+---------+-----------+----------+--------------+ CFV  Full           Yes      Yes                                 +-----+---------------+---------+-----------+----------+--------------+ SFJ  Full                                                        +-----+---------------+---------+-----------+----------+--------------+   +---------+---------------+---------+-----------+----------+---------------+ LEFT     CompressibilityPhasicitySpontaneityPropertiesThrombus Aging   +---------+---------------+---------+-----------+----------+---------------+ CFV      Full           Yes      Yes                                  +---------+---------------+---------+-----------+----------+---------------+ SFJ      Full                                                         +---------+---------------+---------+-----------+----------+---------------+  FV Prox  Full                                                         +---------+---------------+---------+-----------+----------+---------------+ FV Mid   Full                                                         +---------+---------------+---------+-----------+----------+---------------+ FV DistalFull           Yes      Yes                                  +---------+---------------+---------+-----------+----------+---------------+ PFV      Full                                                         +---------+---------------+---------+-----------+----------+---------------+ POP      Full           Yes      Yes                                  +---------+---------------+---------+-----------+----------+---------------+ PTV                                                   patent by color +---------+---------------+---------+-----------+----------+---------------+ PERO                                                  patent by color +---------+---------------+---------+-----------+----------+---------------+    Summary: RIGHT: - No evidence of common femoral vein obstruction.   LEFT: - There is no evidence of deep vein thrombosis in the lower extremity. However, portions of this examination were limited- see technologist comments above.  - No cystic structure found in the popliteal fossa. - Ultrasound characteristics of enlarged lymph nodes noted in the groin.  *See table(s) above for measurements and observations.    Preliminary    DG Chest Portable 1 View Result Date:  04/19/2024 CLINICAL DATA:  Shortness of breath. EXAM: PORTABLE CHEST 1 VIEW COMPARISON:  July 11, 2023. FINDINGS: The heart size and mediastinal contours are within normal limits. Both lungs are clear. The visualized skeletal structures are unremarkable. IMPRESSION: No active disease. Electronically Signed   By: Rosalene Colon M.D.   On: 04/19/2024 08:24     Ultrasound ED Echo  Date/Time: 04/19/2024 8:35 AM  Performed by: Felicie Horning, PA-C Authorized by: Felicie Horning, PA-C   Procedure details:    Indications: dyspnea     Views: parasternal long axis view and parasternal short axis view  Images: archived     Limitations:  Acoustic shadowing and body habitus Findings:    Pericardium: no pericardial effusion     LV Function: normal (>50% EF)   Ultrasound ED Thoracic  Date/Time: 04/19/2024 8:37 AM  Performed by: Felicie Horning, PA-C Authorized by: Felicie Horning, PA-C   Procedure details:    Indications: dyspnea     Assessment for:  Interstitial syndrome   Images: archived     Limitations:  Body habitus Findings:    B-lines noted throughout: not identified   Impression:    Impression: none   .Ultrasound ED Peripheral IV (Provider)  Date/Time: 04/19/2024 8:50 AM  Performed by: Felicie Horning, PA-C Authorized by: Felicie Horning, PA-C   Procedure details:    Indications: poor IV access     Skin Prep: chlorhexidine  gluconate     Location:  Right AC   Angiocath:  20 G   Bedside Ultrasound Guided: Yes     Images: not archived     Dressing applied: Yes      Medications Ordered in the ED  iohexol  (OMNIPAQUE ) 350 MG/ML injection 75 mL (75 mLs Intravenous Contrast Given 04/19/24 1109)                                    Medical Decision Making Amount and/or Complexity of Data Reviewed Labs: ordered. Radiology: ordered.   This patient presents to the ED with chief complaint(s) of leg swelling .  The complaint involves an extensive  differential diagnosis and also carries with it a high risk of complications and morbidity.   Pertinent past medical history as listed in HPI  The differential diagnosis includes  DVT, cellulitis, septic joint, lymphedema/venous stasis, CHF Additional history obtained: Records reviewed Care Everywhere/External Records  Assessment and management:   Hemodynamically stable patient presenting with complaints of left lower extremity swelling, pain and wound.  The symptoms developed over the past few weeks.  Was treated with a course of doxycycline  without any significant improvement.  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  He is currently on Xarelto  and reports compliance. States he feels like he currently has another DVT.  Last one was a 6 months ago.  Notes that he has developed clots in the past while on [facility].  He denies any chest pain, however he does endorse dyspnea on exertion.  This has reportedly been ongoing for several months but seems to have been worsening.  No cough or URI symptoms.  His lungs are clear without any wheezing or rails.  No evidence of pulmonary edema on chest x-ray or POCUS.  On exam he does have significant bilateral lower extremity edema, appears to be worse on the left.  There is an approximately 6 cm wound on the left lower extremity with granulation, surrounding erythema, no warmth, induration or fluctuance.  More consistent with venous stasis wounds over cellulitis.  Regardless we will obtain routine labs.  Given significant clotting history will obtain lower extremity Doppler and CT angio of the chest.  Doppler and CT negative for DVT and PE respectively.  Overall wound is most consistent with chronic venous stasis wound.  Given recent worsening however we will send a prescription for Keflex  and Bactrim  and have him follow-up with wound care.  Independent ECG interpretation:  Sinus rhythm, borderline T wave abnormalities, abnormal R wave progression,  LVH  Independent labs  interpretation:  The following labs were independently interpreted:  CBC without significant abnormality, CMP with albumin of 2.7  Independent visualization and interpretation of imaging: I independently visualized the following imaging with scope of interpretation limited to determining acute life threatening conditions related to emergency care:  Chest x-ray no active cardiopulmonary disease Left lower extremity Doppler no evidence of DVT, although limited in some areas CT angio PE negative for PE or acute abnormality  Consultations obtained:   none  Disposition:   Patient will be discharged home. The patient has been appropriately medically screened and/or stabilized in the ED. I have low suspicion for any other emergent medical condition which would require further screening, evaluation or treatment in the ED or require inpatient management. At time of discharge the patient is hemodynamically stable and in no acute distress. I have discussed work-up results and diagnosis with patient and answered all questions. Patient is agreeable with discharge plan. We discussed strict return precautions for returning to the emergency department and they verbalized understanding.     Social Determinants of Health:   none  This note was dictated with voice recognition software.  Despite best efforts at proofreading, errors may have occurred which can change the documentation meaning.       Final diagnoses:  Visit for wound check  DOE (dyspnea on exertion)    ED Discharge Orders          Ordered    cephALEXin  (KEFLEX ) 500 MG capsule  4 times daily        04/19/24 1146    sulfamethoxazole -trimethoprim  (BACTRIM  DS) 800-160 MG tablet  2 times daily        04/19/24 1146               Stanton Earthly 04/19/24 1146    Carin Charleston, MD 04/19/24 1610

## 2024-04-19 NOTE — Telephone Encounter (Signed)
 Notified patient of sleep study results and recommendations. All questions were answered and patient verbalized understanding. CPAP Titration ordered today.

## 2024-04-19 NOTE — ED Notes (Signed)
 Patient transported to CT

## 2024-04-19 NOTE — Discharge Instructions (Signed)
 You were evaluated in the emergency room for evaluation of a wound.  A prescription for antibiotics was sent into your pharmacy.  Please call the number on the sheet to schedule with wound care.  If you experience any worsening symptoms please return to the emergency room.

## 2024-04-19 NOTE — ED Triage Notes (Signed)
 Pt c.o worsening sores to his left lower leg. Pt seen at wound care and pain management but they are not getting any better. Pt denies fever but c.o chills.

## 2024-04-20 DIAGNOSIS — Z7689 Persons encountering health services in other specified circumstances: Secondary | ICD-10-CM | POA: Diagnosis not present

## 2024-04-22 DIAGNOSIS — L03116 Cellulitis of left lower limb: Secondary | ICD-10-CM | POA: Diagnosis not present

## 2024-04-22 DIAGNOSIS — I10 Essential (primary) hypertension: Secondary | ICD-10-CM | POA: Diagnosis not present

## 2024-04-22 DIAGNOSIS — R7303 Prediabetes: Secondary | ICD-10-CM | POA: Diagnosis not present

## 2024-04-22 DIAGNOSIS — I890 Lymphedema, not elsewhere classified: Secondary | ICD-10-CM | POA: Diagnosis not present

## 2024-04-22 DIAGNOSIS — Z7689 Persons encountering health services in other specified circumstances: Secondary | ICD-10-CM | POA: Diagnosis not present

## 2024-04-23 DIAGNOSIS — Z7689 Persons encountering health services in other specified circumstances: Secondary | ICD-10-CM | POA: Diagnosis not present

## 2024-04-24 ENCOUNTER — Encounter (HOSPITAL_BASED_OUTPATIENT_CLINIC_OR_DEPARTMENT_OTHER): Admitting: Cardiovascular Disease

## 2024-04-24 DIAGNOSIS — Z7689 Persons encountering health services in other specified circumstances: Secondary | ICD-10-CM | POA: Diagnosis not present

## 2024-04-24 NOTE — Progress Notes (Deleted)
 Hypertension Clinic Follow Up:    Date:  04/24/2024   ID:  [name] [name], DOB Mar 16, 1961, MRN [id number]  PCP:  Jenelle Mis, FNP  Cardiologist:  None   Referring MD: Jenelle Mis, FNP   CC: Hypertension  History of Present Illness:    [name] [name] is a 63 y.o. male with a hx of hypertension, hyperlipidemia, stroke, COPD, and substance abuse here for follow-up.  He previously saw Neomi Banks, NP 03/2024.  Echo in 2016 revealed LVEF 55-60%.  She has a repeat echo pending that has not been completed.  He was first diagnosed with hypertension in his 30s to 40s.  It is always been difficult to control.  He has history of smoking up to 3 packs cigarettes daily.  At his visit 03/2024 he was down to 1-1/2 packs daily.  He has a history of substance abuse on methadone.  Blood pressure at his initial visit was 170/75.  Losartan  was switched to valsartan .  He did experience fatigue but clonidine  was thought to be contributory.  Given that he is on methadone he was asked to continue this.  Renal Dopplers and renin/aldosterone levels were ordered but not yet completed.  Addressed lipids  Discussed the use of AI scribe software for clinical note transcription with the patient, who gave verbal consent to proceed.  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     Allergies:   Amlodipine    Social History   Socioeconomic History   Marital status: Divorced    Spouse name: Not on file   Number of children: Not on file   Years of education: Not on file   Highest education level: GED or equivalent  Occupational History   Not on file  Tobacco Use   Smoking status: Every Heath    Current packs/Camerer: 1.50    Types: Cigarettes   Smokeless tobacco: Never   Tobacco comments:    increased to 2ppd  Vaping Use   Vaping status: Never Used  Substance and Sexual Activity   Alcohol use: No    Alcohol/week: 0.0 standard drinks of alcohol    Comment: former   Drug use: Yes    Comment: Heroin-just used 3 days ago   Sexual activity: Yes  Other Topics Concern   Not on file  Social History Narrative   Not on file   Social Drivers of Health   Financial Resource Strain:  High Risk (01/10/2024)   Overall Financial Resource Strain (CARDIA)    Difficulty of Paying Living Expenses: Hard  Food Insecurity: Food Insecurity Present (01/10/2024)   Hunger Vital Sign    Worried About Running Out of Food in the Last Year: Often true    Ran Out of Food in the Last Year: Often true  Transportation Needs: Unmet Transportation Needs (01/10/2024)   PRAPARE - Administrator, Civil Service (Medical): No    Lack of Transportation (Non-Medical): Yes  Physical Activity: Unknown (01/10/2024)   Exercise Vital Sign    Days of Exercise per Week: 0 days    Minutes of Exercise per Session: Not on file  Stress: Stress Concern Present (01/10/2024)   [employer] of Occupational Health - Occupational Stress Questionnaire    Feeling of Stress : Rather much  Social Connections: Socially Isolated (01/10/2024)   Social  Connection and Isolation Panel    Frequency of Communication with Friends and Family: Once a week    Frequency of Social Gatherings with Friends and Family: Never    Attends Religious Services: Never    Database administrator or Organizations: No    Attends Engineer, structural: Not on file    Marital Status: Divorced     Family History: The patient's ***family history includes Cancer in his mother; Depression in his sister; Heart failure in his mother; Hypertension in his father and mother.  ROS:   Please see the history of present illness.    *** All other systems reviewed and are negative.  EKGs/Labs/Other Studies Reviewed:    EKG:  EKG is *** ordered today.  The ekg ordered today demonstrates ***  Recent Labs: 07/11/2023: Magnesium 2.0 03/06/2024: TSH 1.86 04/19/2024: ALT 12; B Natriuretic Peptide 46.2; BUN 15; Creatinine, Ser 0.83; Hemoglobin 12.1; Platelets 221; Potassium 4.0; Sodium 136   Recent Lipid Panel    Component Value Date/Time   CHOL 156 03/06/2024 1510   TRIG 70 03/06/2024 1510   HDL 58 03/06/2024 1510   CHOLHDL 2.7 03/06/2024 1510   VLDL 24 09/19/2015 0250   LDLCALC 83 03/06/2024 1510   LDLDIRECT 75 01/14/2013 0907    Physical Exam:    VS:  There were no vitals taken for this visit. , BMI There is no height or weight on file to calculate BMI. GENERAL:  Well appearing HEENT: Pupils equal round and reactive, fundi not visualized, oral mucosa unremarkable NECK:  No jugular venous distention, waveform within normal limits, carotid upstroke brisk and symmetric, no bruits, no thyromegaly LYMPHATICS:  No cervical adenopathy LUNGS:  Clear to auscultation bilaterally HEART:  RRR.  PMI not displaced or sustained,S1 and S2 within normal limits, no S3, no S4, no clicks, no rubs, *** murmurs ABD:  Flat, positive bowel sounds normal in frequency in pitch, no bruits, no rebound, no guarding, no midline pulsatile mass, no hepatomegaly, no splenomegaly EXT:  2  plus pulses throughout, no edema, no cyanosis no clubbing SKIN:  No rashes no nodules NEURO:  Cranial nerves II through XII grossly intact, motor grossly intact throughout PSYCH:  Cognitively intact, oriented to person place and time   ASSESSMENT/PLAN:    Assessment and Plan Assessment & Plan       Disposition:    FU with MD/PharmD in {gen number [id number]} {Days to years:10300} {virtual or in-office} FU with Nutrition in {gen number [id number]} {Days to years:10300} FU with Care Guide in {gen number [id number]} {Days to years:10300} FU with Social Work  in {gen number [id number]} {Days to years:10300}   Medication Adjustments/Labs and Tests Ordered: Current medicines are reviewed at length with the patient today.  Concerns regarding medicines are outlined above.  No orders of the defined types were placed in this encounter.  No orders of the defined types were placed in this encounter.    Signed, Maudine Sos, MD  04/24/2024 1:15 PM    [facility]

## 2024-04-26 DIAGNOSIS — I890 Lymphedema, not elsewhere classified: Secondary | ICD-10-CM | POA: Diagnosis not present

## 2024-04-26 DIAGNOSIS — Z7689 Persons encountering health services in other specified circumstances: Secondary | ICD-10-CM | POA: Diagnosis not present

## 2024-04-27 DIAGNOSIS — Z7689 Persons encountering health services in other specified circumstances: Secondary | ICD-10-CM | POA: Diagnosis not present

## 2024-04-29 ENCOUNTER — Telehealth (HOSPITAL_COMMUNITY): Payer: Self-pay | Admitting: Family Medicine

## 2024-04-29 DIAGNOSIS — Z7689 Persons encountering health services in other specified circumstances: Secondary | ICD-10-CM | POA: Diagnosis not present

## 2024-04-29 NOTE — Telephone Encounter (Signed)
 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 He will call back to reschedule . Order will be removed from the active echo wq and if patient calls back to reschedule we will reinstate the order. Thank you.

## 2024-04-30 ENCOUNTER — Ambulatory Visit (HOSPITAL_COMMUNITY)

## 2024-04-30 ENCOUNTER — Ambulatory Visit (HOSPITAL_COMMUNITY): Admission: RE | Admit: 2024-04-30 | Source: Ambulatory Visit

## 2024-04-30 DIAGNOSIS — I890 Lymphedema, not elsewhere classified: Secondary | ICD-10-CM | POA: Diagnosis not present

## 2024-04-30 DIAGNOSIS — Z7689 Persons encountering health services in other specified circumstances: Secondary | ICD-10-CM | POA: Diagnosis not present

## 2024-05-01 DIAGNOSIS — Z7689 Persons encountering health services in other specified circumstances: Secondary | ICD-10-CM | POA: Diagnosis not present

## 2024-05-03 ENCOUNTER — Encounter (HOSPITAL_BASED_OUTPATIENT_CLINIC_OR_DEPARTMENT_OTHER): Admitting: Internal Medicine

## 2024-05-03 DIAGNOSIS — Z7689 Persons encountering health services in other specified circumstances: Secondary | ICD-10-CM | POA: Diagnosis not present

## 2024-05-04 DIAGNOSIS — Z7689 Persons encountering health services in other specified circumstances: Secondary | ICD-10-CM | POA: Diagnosis not present

## 2024-05-06 DIAGNOSIS — Z7689 Persons encountering health services in other specified circumstances: Secondary | ICD-10-CM | POA: Diagnosis not present

## 2024-05-07 ENCOUNTER — Encounter: Payer: Self-pay | Admitting: Family Medicine

## 2024-05-07 DIAGNOSIS — Z7689 Persons encountering health services in other specified circumstances: Secondary | ICD-10-CM | POA: Diagnosis not present

## 2024-05-07 DIAGNOSIS — I890 Lymphedema, not elsewhere classified: Secondary | ICD-10-CM | POA: Diagnosis not present

## 2024-05-08 ENCOUNTER — Telehealth: Admitting: Family Medicine

## 2024-05-08 ENCOUNTER — Encounter: Payer: Self-pay | Admitting: Family Medicine

## 2024-05-08 ENCOUNTER — Ambulatory Visit: Admitting: Family Medicine

## 2024-05-08 DIAGNOSIS — G4719 Other hypersomnia: Secondary | ICD-10-CM | POA: Diagnosis not present

## 2024-05-08 DIAGNOSIS — Z7689 Persons encountering health services in other specified circumstances: Secondary | ICD-10-CM | POA: Diagnosis not present

## 2024-05-08 MED ORDER — GABAPENTIN 100 MG PO CAPS: 300.0000 mg | ORAL_CAPSULE | Freq: Three times a day (TID) | ORAL | 1 refills | Status: DC

## 2024-05-08 MED ORDER — DULOXETINE HCL 30 MG PO CPEP: 30.0000 mg | ORAL_CAPSULE | Freq: Every day | ORAL | 0 refills | Status: DC

## 2024-05-08 NOTE — Assessment & Plan Note (Signed)
 No red flags on exam, symptoms correlate with EDS and poor nighttime sleep. Encouraged good sleep hygiene and a sleep diary. Will decrease doses of Duloxetine  and Gabapentin . Follow up in 1-2 weeks. Needs CPAP. May need referral to sleep studies for further evaluation.

## 2024-05-08 NOTE — Progress Notes (Signed)
 Virtual Visit via Video note  I connected with [name] [name] on 05/08/24 at 1150 by video and verified that I am speaking with the correct person using two identifiers. [name] [name] is currently located at home and no one is currently with him during visit. The provider, Jeoffrey GORMAN Barrio, FNP is located in their office at time of visit.  I discussed the limitations, risks, security and privacy concerns of performing an evaluation and management service by video and the availability of in person appointments. I also discussed with the patient that there may be a patient responsible charge related to this service. The patient expressed understanding and agreed to proceed.  Subjective: PCP: Barrio Jeoffrey GORMAN, FNP  No chief complaint on file.   HPI Pt is here today for follow up for He is concerned today about falling asleep during the [place] at the drop of a dime and not sleeping more than 1.5 hours at night. He falls asleep watching TV and while standing. He reports sleep walking and stealing his roommates pillow during the [place] and other behaviors that he finds concerning. He is sleeping very little at night with only 1.5 hours of sleep after falling asleep. He wakes up in odd situations such as sitting on the toilet. This has been ongoing for 2-3 weeks every [place] and every night. He denies confusion, AMS, slurred speech, gait difficulties or unilateral weakness. No new medications. He denies drug or alcohol use and is only taking prescribed substances. Would like to wean Duloxetine  and Gabapentin  [name] [name] did have a recent sleep study that showed OSA and CPAP has been recommended.   ROS: Per HPI  Current Outpatient Medications:    cloNIDine  (CATAPRES ) 0.1 MG tablet, Take 1 tablet (0.1 mg total) by mouth 3 (three) times daily., Disp: 270 tablet, Rfl: 1   hydrochlorothiazide  (HYDRODIURIL ) 25 MG tablet, Take 1 tablet (25 mg total) by mouth daily., Disp: 90 tablet, Rfl: 0   loratadine  (CLARITIN ) 10 MG  tablet, Take 1 tablet (10 mg total) by mouth daily., Disp: 90 tablet, Rfl: 0   nebivolol  (BYSTOLIC ) 5 MG tablet, Take 1 tablet (5 mg total) by mouth daily., Disp: 90 tablet, Rfl: 3   rivaroxaban  (XARELTO ) 20 MG TABS tablet, Take 1 tablet (20 mg total) by mouth daily with supper., Disp: 90 tablet, Rfl: 0   valsartan  (DIOVAN ) 320 MG tablet, Take 1 tablet (320 mg total) by mouth daily., Disp: 90 tablet, Rfl: 1   albuterol  (VENTOLIN  HFA) 108 (90 Base) MCG/ACT inhaler, Inhale 2 puffs into the lungs every 6 (six) hours as needed for wheezing or shortness of breath., Disp: 8 g, Rfl: 0   DULoxetine  (CYMBALTA ) 30 MG capsule, Take 1 capsule (30 mg total) by mouth daily., Disp: 90 capsule, Rfl: 0   gabapentin  (NEURONTIN ) 100 MG capsule, Take 3 capsules (300 mg total) by mouth 3 (three) times daily., Disp: 270 capsule, Rfl: 1  Observations/Objective: Physical Exam Constitutional:      Appearance: Normal appearance.  Pulmonary:     Effort: No respiratory distress.  Neurological:     General: No focal deficit present.     Mental Status: He is alert and oriented to person, place, and time.  Psychiatric:        Mood and Affect: Mood normal.        Behavior: Behavior normal.        Thought Content: Thought content normal.        Judgment: Judgment normal.    Assessment  and Plan: Excessive daytime sleepiness Assessment & Plan: No red flags on exam, symptoms correlate with EDS and poor nighttime sleep. Encouraged good sleep hygiene and a sleep diary. Will decrease doses of Duloxetine  and Gabapentin . Follow up in 1-2 weeks. Needs CPAP. May need referral to sleep studies for further evaluation.    Other orders -     DULoxetine  HCl; Take 1 capsule (30 mg total) by mouth daily.  Dispense: 90 capsule; Refill: 0 -     Gabapentin ; Take 3 capsules (300 mg total) by mouth 3 (three) times daily.  Dispense: 270 capsule; Refill: 1    Follow Up Instructions: Return in about 2 weeks (around 05/22/2024) for  follow-up.   I discussed the assessment and treatment plan with the patient. The patient was provided an opportunity to ask questions and all were answered. The patient agreed with the plan and demonstrated an understanding of the instructions.   The patient was advised to call back or seek an in-person evaluation if the symptoms worsen or if the condition fails to improve as anticipated.  The above assessment and management plan was discussed with the patient. The patient verbalized understanding of and has agreed to the management plan. Patient is aware to call the clinic if symptoms persist or worsen. Patient is aware when to return to the clinic for a follow-up visit. Patient educated on when it is appropriate to go to the emergency department.   Time call ended: 1207  I provided 17 minutes of face-to-face time during this encounter.   Jeoffrey Barrio, MSN, APRN, FNP-C [facility]

## 2024-05-09 DIAGNOSIS — Z7689 Persons encountering health services in other specified circumstances: Secondary | ICD-10-CM | POA: Diagnosis not present

## 2024-05-11 DIAGNOSIS — Z7689 Persons encountering health services in other specified circumstances: Secondary | ICD-10-CM | POA: Diagnosis not present

## 2024-05-13 DIAGNOSIS — Z7689 Persons encountering health services in other specified circumstances: Secondary | ICD-10-CM | POA: Diagnosis not present

## 2024-05-14 DIAGNOSIS — Z7689 Persons encountering health services in other specified circumstances: Secondary | ICD-10-CM | POA: Diagnosis not present

## 2024-05-14 DIAGNOSIS — I890 Lymphedema, not elsewhere classified: Secondary | ICD-10-CM | POA: Diagnosis not present

## 2024-05-15 DIAGNOSIS — Z7689 Persons encountering health services in other specified circumstances: Secondary | ICD-10-CM | POA: Diagnosis not present

## 2024-05-16 DIAGNOSIS — Z7689 Persons encountering health services in other specified circumstances: Secondary | ICD-10-CM | POA: Diagnosis not present

## 2024-05-17 DIAGNOSIS — Z7689 Persons encountering health services in other specified circumstances: Secondary | ICD-10-CM | POA: Diagnosis not present

## 2024-05-18 DIAGNOSIS — Z419 Encounter for procedure for purposes other than remedying health state, unspecified: Secondary | ICD-10-CM | POA: Diagnosis not present

## 2024-05-18 DIAGNOSIS — Z7689 Persons encountering health services in other specified circumstances: Secondary | ICD-10-CM | POA: Diagnosis not present

## 2024-05-20 ENCOUNTER — Encounter: Payer: Self-pay | Admitting: Family Medicine

## 2024-05-20 DIAGNOSIS — Z7689 Persons encountering health services in other specified circumstances: Secondary | ICD-10-CM | POA: Diagnosis not present

## 2024-05-21 DIAGNOSIS — I890 Lymphedema, not elsewhere classified: Secondary | ICD-10-CM | POA: Diagnosis not present

## 2024-05-21 DIAGNOSIS — Z7689 Persons encountering health services in other specified circumstances: Secondary | ICD-10-CM | POA: Diagnosis not present

## 2024-05-22 ENCOUNTER — Telehealth: Payer: Self-pay | Admitting: Cardiology

## 2024-05-22 ENCOUNTER — Telehealth: Payer: Self-pay | Admitting: Family Medicine

## 2024-05-22 DIAGNOSIS — G4733 Obstructive sleep apnea (adult) (pediatric): Secondary | ICD-10-CM

## 2024-05-22 DIAGNOSIS — R400 Somnolence: Secondary | ICD-10-CM

## 2024-05-22 DIAGNOSIS — Z7689 Persons encountering health services in other specified circumstances: Secondary | ICD-10-CM | POA: Diagnosis not present

## 2024-05-22 DIAGNOSIS — I1A0 Resistant hypertension: Secondary | ICD-10-CM

## 2024-05-22 NOTE — Telephone Encounter (Signed)
 Reche Finder, NP sent message to follow up on CPAP titration. I dont see that it has been authorized yet. Please advise.

## 2024-05-22 NOTE — Telephone Encounter (Signed)
 Called to discuss concerns of daytime sleepiness and medication adjustments to find out further information about his symptoms and come up with a treatment plan. No answer.

## 2024-05-23 DIAGNOSIS — Z7689 Persons encountering health services in other specified circumstances: Secondary | ICD-10-CM | POA: Diagnosis not present

## 2024-05-23 NOTE — Telephone Encounter (Addendum)
**Note De-Identified Aniko Finnigan Obfuscation** I started a CPAP Titration PA through the [facility] Provider Portal and it is currently pending. Case Number: [id number]

## 2024-05-24 DIAGNOSIS — Z7689 Persons encountering health services in other specified circumstances: Secondary | ICD-10-CM | POA: Diagnosis not present

## 2024-05-25 DIAGNOSIS — Z7689 Persons encountering health services in other specified circumstances: Secondary | ICD-10-CM | POA: Diagnosis not present

## 2024-05-27 DIAGNOSIS — Z7689 Persons encountering health services in other specified circumstances: Secondary | ICD-10-CM | POA: Diagnosis not present

## 2024-05-28 DIAGNOSIS — Z7689 Persons encountering health services in other specified circumstances: Secondary | ICD-10-CM | POA: Diagnosis not present

## 2024-05-29 DIAGNOSIS — Z7689 Persons encountering health services in other specified circumstances: Secondary | ICD-10-CM | POA: Diagnosis not present

## 2024-05-30 DIAGNOSIS — I890 Lymphedema, not elsewhere classified: Secondary | ICD-10-CM | POA: Diagnosis not present

## 2024-05-30 DIAGNOSIS — Z7689 Persons encountering health services in other specified circumstances: Secondary | ICD-10-CM | POA: Diagnosis not present

## 2024-05-31 DIAGNOSIS — Z7689 Persons encountering health services in other specified circumstances: Secondary | ICD-10-CM | POA: Diagnosis not present

## 2024-05-31 NOTE — Telephone Encounter (Signed)
**Note De-Identified Adalis Gatti Obfuscation** Per the [facility] provider portal, this PA is still pending.

## 2024-06-01 DIAGNOSIS — Z7689 Persons encountering health services in other specified circumstances: Secondary | ICD-10-CM | POA: Diagnosis not present

## 2024-06-03 ENCOUNTER — Other Ambulatory Visit: Payer: Self-pay | Admitting: Family Medicine

## 2024-06-03 DIAGNOSIS — Z7689 Persons encountering health services in other specified circumstances: Secondary | ICD-10-CM | POA: Diagnosis not present

## 2024-06-03 DIAGNOSIS — I890 Lymphedema, not elsewhere classified: Secondary | ICD-10-CM | POA: Diagnosis not present

## 2024-06-04 DIAGNOSIS — Z7689 Persons encountering health services in other specified circumstances: Secondary | ICD-10-CM | POA: Diagnosis not present

## 2024-06-05 DIAGNOSIS — Z7689 Persons encountering health services in other specified circumstances: Secondary | ICD-10-CM | POA: Diagnosis not present

## 2024-06-05 NOTE — Telephone Encounter (Signed)
**Note De-Identified Waldemar Siegel Obfuscation** Per letter received from [employer], they have denied coverage of the pts CPAP Titration for the following reasons:  Treatment with APAP (auto-adjusting positive airway pressure) must have been tried for  at least 30 days. APAP is a device that helps to keep your airway open during sleep. The  notes received from your doctor do not show this trial. Your records must show you have at least one of these problems.  Moderate to severe lung disease.  A moderate to severe type of weakness of the heart that leads to fluid building up in  the lungs and the surrounding tissues (congestive heart failure).  One of the other illnesses or diseases listed in this guideline. One of these scenarios must be present.  Results of an apnea/hypopnea index (AHI) must be at least five per hour with ongoing  symptoms of obstructive sleep apnea (OSA). This must be while you are being treated  with an auto-adjusting positive airway (APAP) device. APAP helps to keep your airway open during sleep. OSA is when you stop and start breathing over and over during sleep.  Symptoms persist despite the use of APAP. APAP must be used at least four hours per  night for 70 percent of the nights during a 30-Fedrick trial. The notes sent to us  do not describe either one of these scenarios. This finding was based on [employer] Sleep Disordered Breathing Diagnosis and Treatment  Guidelines Section(s): Initial Sleep Diagnostic and Treatment testing (SL-2.1) and 1.0  General Guidelines. ? Without this additional information, your request did not meet criteria for approval  found in [employer] Sleep Disordered Breathing Diagnosis and Treatment Guidelines  Section(s): Initial Sleep Diagnostic and Treatment testing (SL-2.1) and 1.0 General  Guidelines.  Forwarding this note to Dr Shlomo for her recommendation.

## 2024-06-06 DIAGNOSIS — Z7689 Persons encountering health services in other specified circumstances: Secondary | ICD-10-CM | POA: Diagnosis not present

## 2024-06-07 DIAGNOSIS — Z7689 Persons encountering health services in other specified circumstances: Secondary | ICD-10-CM | POA: Diagnosis not present

## 2024-06-08 DIAGNOSIS — Z7689 Persons encountering health services in other specified circumstances: Secondary | ICD-10-CM | POA: Diagnosis not present

## 2024-06-10 DIAGNOSIS — Z7689 Persons encountering health services in other specified circumstances: Secondary | ICD-10-CM | POA: Diagnosis not present

## 2024-06-11 ENCOUNTER — Telehealth: Payer: Self-pay | Admitting: Cardiology

## 2024-06-11 DIAGNOSIS — Z7689 Persons encountering health services in other specified circumstances: Secondary | ICD-10-CM | POA: Diagnosis not present

## 2024-06-11 NOTE — Telephone Encounter (Signed)
 Pt is requesting a callback regarding him stating he received a call this morning but there aren't any notes. He'd also like to discuss the denial letter he received in the mail from insurance as well. Pt is very concerned and is requesting a callback. Pt was in the callback queue and when we answered the phone he was snoring in the phone and then randomly woke up. Pt was then placed on a brief hold and then was sleeping again when I came back to the phone. He did wake up after I kept calling his name so I was able to let him know a nurse would contact him. Please advise

## 2024-06-11 NOTE — Telephone Encounter (Signed)
 Spoke with pt regarding a phone call and insurance denial. Pt stated he received a phone call this morning but wasn't sure who it was from.Pt was told there is no note in the chart that someone called him and hopefully whoever it was will call back. Pt also stated he received a letter in the mail denying his sleep study and that there is a time limit on the denial. Pt was told his concerns would be sent to sleep team as well as Dr. Shlomo. Pt verbalized understanding. All questions if any were answered.

## 2024-06-12 DIAGNOSIS — Z7689 Persons encountering health services in other specified circumstances: Secondary | ICD-10-CM | POA: Diagnosis not present

## 2024-06-13 DIAGNOSIS — Z7689 Persons encountering health services in other specified circumstances: Secondary | ICD-10-CM | POA: Diagnosis not present

## 2024-06-13 NOTE — Telephone Encounter (Addendum)
 Per Dr Shlomo, Order ResMed CPAP on auto from 4 to 20cm H2O with heated humidity, mask of choice   Upon patient request DME selection is [facility] Patient understands he will be contacted by [facility] to set up his cpap. Patient understands to call if [facility] does not contact him with new setup in a timely manner. Patient understands they will be called once confirmation has been received from [facility] that they have received their new machine to schedule 10 week follow up appointment.   [facility] notified of new cpap order  Please add to airview Patient was grateful for the call and thanked me.

## 2024-06-13 NOTE — Telephone Encounter (Signed)
 Per Dr Shlomo, Order ResMed CPAP on auto from 4 to 20cm H2O with heated humidity, mask of choice   Upon patient request DME selection is [facility] Patient understands he will be contacted by [facility] to set up his cpap. Patient understands to call if [facility] does not contact him with new setup in a timely manner. Patient understands they will be called once confirmation has been received from [facility] that they have received their new machine to schedule 10 week follow up appointment.   [facility] notified of new cpap order  Please add to airview Patient was grateful for the call and thanked me.

## 2024-06-14 DIAGNOSIS — Z7689 Persons encountering health services in other specified circumstances: Secondary | ICD-10-CM | POA: Diagnosis not present

## 2024-06-15 DIAGNOSIS — Z7689 Persons encountering health services in other specified circumstances: Secondary | ICD-10-CM | POA: Diagnosis not present

## 2024-06-17 DIAGNOSIS — I890 Lymphedema, not elsewhere classified: Secondary | ICD-10-CM | POA: Diagnosis not present

## 2024-06-17 DIAGNOSIS — L0390 Cellulitis, unspecified: Secondary | ICD-10-CM | POA: Diagnosis not present

## 2024-06-17 DIAGNOSIS — Z7689 Persons encountering health services in other specified circumstances: Secondary | ICD-10-CM | POA: Diagnosis not present

## 2024-06-18 DIAGNOSIS — Z419 Encounter for procedure for purposes other than remedying health state, unspecified: Secondary | ICD-10-CM | POA: Diagnosis not present

## 2024-06-18 DIAGNOSIS — Z7689 Persons encountering health services in other specified circumstances: Secondary | ICD-10-CM | POA: Diagnosis not present

## 2024-06-19 ENCOUNTER — Ambulatory Visit: Admitting: Family Medicine

## 2024-06-19 DIAGNOSIS — Z7689 Persons encountering health services in other specified circumstances: Secondary | ICD-10-CM | POA: Diagnosis not present

## 2024-06-19 DIAGNOSIS — I890 Lymphedema, not elsewhere classified: Secondary | ICD-10-CM | POA: Diagnosis not present

## 2024-06-20 DIAGNOSIS — Z7689 Persons encountering health services in other specified circumstances: Secondary | ICD-10-CM | POA: Diagnosis not present

## 2024-06-21 DIAGNOSIS — G4733 Obstructive sleep apnea (adult) (pediatric): Secondary | ICD-10-CM | POA: Diagnosis not present

## 2024-06-21 DIAGNOSIS — Z7689 Persons encountering health services in other specified circumstances: Secondary | ICD-10-CM | POA: Diagnosis not present

## 2024-06-22 DIAGNOSIS — Z7689 Persons encountering health services in other specified circumstances: Secondary | ICD-10-CM | POA: Diagnosis not present

## 2024-06-24 ENCOUNTER — Ambulatory Visit (HOSPITAL_COMMUNITY)

## 2024-06-24 DIAGNOSIS — L0390 Cellulitis, unspecified: Secondary | ICD-10-CM | POA: Diagnosis not present

## 2024-06-24 DIAGNOSIS — Z7689 Persons encountering health services in other specified circumstances: Secondary | ICD-10-CM | POA: Diagnosis not present

## 2024-06-24 DIAGNOSIS — I890 Lymphedema, not elsewhere classified: Secondary | ICD-10-CM | POA: Diagnosis not present

## 2024-06-25 DIAGNOSIS — Z7689 Persons encountering health services in other specified circumstances: Secondary | ICD-10-CM | POA: Diagnosis not present

## 2024-06-26 DIAGNOSIS — Z7689 Persons encountering health services in other specified circumstances: Secondary | ICD-10-CM | POA: Diagnosis not present

## 2024-06-27 DIAGNOSIS — Z7689 Persons encountering health services in other specified circumstances: Secondary | ICD-10-CM | POA: Diagnosis not present

## 2024-06-27 DIAGNOSIS — I890 Lymphedema, not elsewhere classified: Secondary | ICD-10-CM | POA: Diagnosis not present

## 2024-06-28 DIAGNOSIS — Z7689 Persons encountering health services in other specified circumstances: Secondary | ICD-10-CM | POA: Diagnosis not present

## 2024-06-29 DIAGNOSIS — Z7689 Persons encountering health services in other specified circumstances: Secondary | ICD-10-CM | POA: Diagnosis not present

## 2024-07-01 ENCOUNTER — Inpatient Hospital Stay (HOSPITAL_COMMUNITY)
Admission: EM | Admit: 2024-07-01 | Discharge: 2024-07-06 | DRG: 603 | Disposition: A | Discharge status: Home / Self Care | Source: Ambulatory Visit | Attending: Internal Medicine | Admitting: Internal Medicine

## 2024-07-01 ENCOUNTER — Inpatient Hospital Stay (HOSPITAL_COMMUNITY)

## 2024-07-01 ENCOUNTER — Encounter (HOSPITAL_COMMUNITY): Payer: Self-pay | Admitting: Emergency Medicine

## 2024-07-01 ENCOUNTER — Emergency Department (HOSPITAL_COMMUNITY)

## 2024-07-01 ENCOUNTER — Other Ambulatory Visit: Payer: Self-pay

## 2024-07-01 DIAGNOSIS — L97929 Non-pressure chronic ulcer of unspecified part of left lower leg with unspecified severity: Secondary | ICD-10-CM | POA: Diagnosis not present

## 2024-07-01 DIAGNOSIS — I5032 Chronic diastolic (congestive) heart failure: Secondary | ICD-10-CM | POA: Diagnosis present

## 2024-07-01 DIAGNOSIS — L97919 Non-pressure chronic ulcer of unspecified part of right lower leg with unspecified severity: Secondary | ICD-10-CM | POA: Diagnosis not present

## 2024-07-01 DIAGNOSIS — R0789 Other chest pain: Secondary | ICD-10-CM | POA: Diagnosis not present

## 2024-07-01 DIAGNOSIS — I872 Venous insufficiency (chronic) (peripheral): Secondary | ICD-10-CM | POA: Diagnosis present

## 2024-07-01 DIAGNOSIS — Z6839 Body mass index (BMI) 39.0-39.9, adult: Secondary | ICD-10-CM | POA: Diagnosis not present

## 2024-07-01 DIAGNOSIS — Z716 Tobacco abuse counseling: Secondary | ICD-10-CM

## 2024-07-01 DIAGNOSIS — F17210 Nicotine dependence, cigarettes, uncomplicated: Secondary | ICD-10-CM | POA: Diagnosis present

## 2024-07-01 DIAGNOSIS — I110 Hypertensive heart disease with heart failure: Secondary | ICD-10-CM | POA: Diagnosis present

## 2024-07-01 DIAGNOSIS — L03119 Cellulitis of unspecified part of limb: Principal | ICD-10-CM

## 2024-07-01 DIAGNOSIS — Z79899 Other long term (current) drug therapy: Secondary | ICD-10-CM

## 2024-07-01 DIAGNOSIS — Z7901 Long term (current) use of anticoagulants: Secondary | ICD-10-CM | POA: Diagnosis not present

## 2024-07-01 DIAGNOSIS — R531 Weakness: Secondary | ICD-10-CM | POA: Diagnosis present

## 2024-07-01 DIAGNOSIS — L03116 Cellulitis of left lower limb: Secondary | ICD-10-CM | POA: Diagnosis not present

## 2024-07-01 DIAGNOSIS — I5033 Acute on chronic diastolic (congestive) heart failure: Secondary | ICD-10-CM | POA: Diagnosis not present

## 2024-07-01 DIAGNOSIS — G4733 Obstructive sleep apnea (adult) (pediatric): Secondary | ICD-10-CM | POA: Diagnosis present

## 2024-07-01 DIAGNOSIS — F419 Anxiety disorder, unspecified: Secondary | ICD-10-CM | POA: Diagnosis present

## 2024-07-01 DIAGNOSIS — Z86718 Personal history of other venous thrombosis and embolism: Secondary | ICD-10-CM | POA: Diagnosis not present

## 2024-07-01 DIAGNOSIS — I5030 Unspecified diastolic (congestive) heart failure: Secondary | ICD-10-CM | POA: Diagnosis present

## 2024-07-01 DIAGNOSIS — I890 Lymphedema, not elsewhere classified: Secondary | ICD-10-CM | POA: Diagnosis present

## 2024-07-01 DIAGNOSIS — Z720 Tobacco use: Secondary | ICD-10-CM | POA: Diagnosis present

## 2024-07-01 DIAGNOSIS — I10 Essential (primary) hypertension: Secondary | ICD-10-CM | POA: Diagnosis present

## 2024-07-01 DIAGNOSIS — E669 Obesity, unspecified: Secondary | ICD-10-CM | POA: Diagnosis present

## 2024-07-01 DIAGNOSIS — Z8249 Family history of ischemic heart disease and other diseases of the circulatory system: Secondary | ICD-10-CM

## 2024-07-01 DIAGNOSIS — Z7689 Persons encountering health services in other specified circumstances: Secondary | ICD-10-CM | POA: Diagnosis not present

## 2024-07-01 DIAGNOSIS — D649 Anemia, unspecified: Secondary | ICD-10-CM | POA: Diagnosis present

## 2024-07-01 DIAGNOSIS — F1190 Opioid use, unspecified, uncomplicated: Secondary | ICD-10-CM

## 2024-07-01 DIAGNOSIS — E785 Hyperlipidemia, unspecified: Secondary | ICD-10-CM | POA: Diagnosis present

## 2024-07-01 DIAGNOSIS — M79606 Pain in leg, unspecified: Secondary | ICD-10-CM | POA: Diagnosis not present

## 2024-07-01 DIAGNOSIS — F1120 Opioid dependence, uncomplicated: Secondary | ICD-10-CM | POA: Diagnosis present

## 2024-07-01 DIAGNOSIS — Z8673 Personal history of transient ischemic attack (TIA), and cerebral infarction without residual deficits: Secondary | ICD-10-CM

## 2024-07-01 DIAGNOSIS — E876 Hypokalemia: Secondary | ICD-10-CM | POA: Diagnosis present

## 2024-07-01 DIAGNOSIS — J439 Emphysema, unspecified: Secondary | ICD-10-CM | POA: Diagnosis present

## 2024-07-01 DIAGNOSIS — E66812 Obesity, class 2: Secondary | ICD-10-CM | POA: Diagnosis not present

## 2024-07-01 DIAGNOSIS — G8929 Other chronic pain: Secondary | ICD-10-CM | POA: Diagnosis present

## 2024-07-01 DIAGNOSIS — R079 Chest pain, unspecified: Secondary | ICD-10-CM | POA: Diagnosis present

## 2024-07-01 DIAGNOSIS — Z888 Allergy status to other drugs, medicaments and biological substances status: Secondary | ICD-10-CM

## 2024-07-01 DIAGNOSIS — Z818 Family history of other mental and behavioral disorders: Secondary | ICD-10-CM

## 2024-07-01 DIAGNOSIS — J449 Chronic obstructive pulmonary disease, unspecified: Secondary | ICD-10-CM | POA: Diagnosis present

## 2024-07-01 DIAGNOSIS — F32A Depression, unspecified: Secondary | ICD-10-CM | POA: Diagnosis present

## 2024-07-01 DIAGNOSIS — Z86711 Personal history of pulmonary embolism: Secondary | ICD-10-CM | POA: Diagnosis present

## 2024-07-01 DIAGNOSIS — L0390 Cellulitis, unspecified: Secondary | ICD-10-CM | POA: Diagnosis not present

## 2024-07-01 DIAGNOSIS — L03115 Cellulitis of right lower limb: Secondary | ICD-10-CM | POA: Diagnosis not present

## 2024-07-01 DIAGNOSIS — F1990 Other psychoactive substance use, unspecified, uncomplicated: Secondary | ICD-10-CM | POA: Diagnosis present

## 2024-07-01 LAB — I-STAT CG4 LACTIC ACID, ED
Lactic Acid, Venous: 1 mmol/L (ref 0.5–1.9)
Lactic Acid, Venous: 0.5 mmol/L (ref 0.5–1.9)

## 2024-07-01 LAB — COMPREHENSIVE METABOLIC PANEL WITH GFR
ALT: 13 U/L (ref 0–44)
AST: 14 U/L — ABNORMAL LOW (ref 15–41)
Albumin: 2.7 g/dL — ABNORMAL LOW (ref 3.5–5.0)
Alkaline Phosphatase: 69 U/L (ref 38–126)
Anion gap: 9 (ref 5–15)
BUN: 18 mg/dL (ref 8–23)
CO2: 23 mmol/L (ref 22–32)
Calcium: 8.6 mg/dL — ABNORMAL LOW (ref 8.9–10.3)
Chloride: 105 mmol/L (ref 98–111)
Creatinine, Ser: 0.91 mg/dL (ref 0.61–1.24)
GFR, Estimated: >60 mL/min (ref >60)
Glucose, Bld: 137 mg/dL — ABNORMAL HIGH (ref 70–99)
Potassium: 4 mmol/L (ref 3.5–5.1)
Sodium: 137 mmol/L (ref 135–145)
Total Bilirubin: 0.3 mg/dL (ref 0.0–1.2)
Total Protein: 6.3 g/dL — ABNORMAL LOW (ref 6.5–8.1)

## 2024-07-01 LAB — CBC WITH DIFFERENTIAL/PLATELET
Abs Immature Granulocytes: 0.05 K/uL (ref 0.00–0.07)
Basophils Absolute: 0.1 K/uL (ref 0.0–0.1)
Basophils Relative: 1 %
Eosinophils Absolute: 0.2 K/uL (ref 0.0–0.5)
Eosinophils Relative: 2 %
HCT: 34.9 % — ABNORMAL LOW (ref 39.0–52.0)
Hemoglobin: 11.1 g/dL — ABNORMAL LOW (ref 13.0–17.0)
Immature Granulocytes: 1 %
Lymphocytes Relative: 28 %
Lymphs Abs: 2.9 K/uL (ref 0.7–4.0)
MCH: 32.7 pg (ref 26.0–34.0)
MCHC: 31.8 g/dL (ref 30.0–36.0)
MCV: 102.9 fL — ABNORMAL HIGH (ref 80.0–100.0)
Monocytes Absolute: 0.4 K/uL (ref 0.1–1.0)
Monocytes Relative: 4 %
Neutro Abs: 6.5 K/uL (ref 1.7–7.7)
Neutrophils Relative %: 64 %
Platelets: 259 K/uL (ref 150–400)
RBC: 3.39 MIL/uL — ABNORMAL LOW (ref 4.22–5.81)
RDW: 14.3 % (ref 11.5–15.5)
WBC: 10.1 K/uL (ref 4.0–10.5)
nRBC: 0 % (ref 0.0–0.2)

## 2024-07-01 LAB — BRAIN NATRIURETIC PEPTIDE: B Natriuretic Peptide: 28.7 pg/mL (ref 0.0–100.0)

## 2024-07-01 LAB — ECHOCARDIOGRAM COMPLETE
Area-P 1/2: 2.96 cm2
Height: 71 in
S' Lateral: 3 cm
Weight: 4497.38 [oz_av]

## 2024-07-01 LAB — TSH: TSH: 1.609 u[IU]/mL (ref 0.350–4.500)

## 2024-07-01 LAB — SEDIMENTATION RATE: Sed Rate: 100 mm/h — ABNORMAL HIGH (ref 0–16)

## 2024-07-01 LAB — TROPONIN I (HIGH SENSITIVITY)
Troponin I (High Sensitivity): 5 ng/L (ref <18)
Troponin I (High Sensitivity): 4 ng/L (ref <18)

## 2024-07-01 MED ORDER — LINEZOLID 600 MG/300ML IV SOLN
600.0000 mg | Freq: Once | INTRAVENOUS | Status: AC
  Administered 2024-07-01: 600 mg via INTRAVENOUS
  Filled 2024-07-01: qty 300

## 2024-07-01 MED ORDER — HYDRALAZINE HCL 20 MG/ML IJ SOLN: 10.0000 mg | INTRAMUSCULAR | Status: DC | PRN

## 2024-07-01 MED ORDER — ONDANSETRON HCL 4 MG PO TABS: 4.0000 mg | ORAL_TABLET | Freq: Four times a day (QID) | ORAL | Status: DC | PRN

## 2024-07-01 MED ORDER — RIVAROXABAN 10 MG PO TABS
20.0000 mg | ORAL_TABLET | Freq: Every day | ORAL | Status: DC
  Administered 2024-07-02 – 2024-07-05 (×4): 20 mg via ORAL
  Filled 2024-07-01 (×2): qty 2
  Filled 2024-07-01: qty 1
  Filled 2024-07-01 (×2): qty 2

## 2024-07-01 MED ORDER — ONDANSETRON HCL 4 MG/2ML IJ SOLN: 4.0000 mg | Freq: Four times a day (QID) | INTRAMUSCULAR | Status: DC | PRN

## 2024-07-01 MED ORDER — DULOXETINE HCL 30 MG PO CPEP
30.0000 mg | ORAL_CAPSULE | Freq: Every day | ORAL | Status: DC
  Administered 2024-07-02 – 2024-07-06 (×5): 30 mg via ORAL
  Filled 2024-07-01 (×6): qty 1

## 2024-07-01 MED ORDER — LINEZOLID 600 MG/300ML IV SOLN
600.0000 mg | Freq: Two times a day (BID) | INTRAVENOUS | Status: DC
  Administered 2024-07-01 – 2024-07-02 (×2): 600 mg via INTRAVENOUS
  Filled 2024-07-01 (×2): qty 300

## 2024-07-01 MED ORDER — NICOTINE 21 MG/24HR TD PT24
21.0000 mg | MEDICATED_PATCH | Freq: Every day | TRANSDERMAL | Status: DC
  Administered 2024-07-01 – 2024-07-06 (×3): 21 mg via TRANSDERMAL
  Filled 2024-07-01 (×7): qty 1

## 2024-07-01 MED ORDER — GABAPENTIN 100 MG PO CAPS
200.0000 mg | ORAL_CAPSULE | Freq: Three times a day (TID) | ORAL | Status: DC
  Administered 2024-07-01 – 2024-07-06 (×16): 200 mg via ORAL
  Filled 2024-07-01 (×20): qty 2

## 2024-07-01 MED ORDER — LORATADINE 10 MG PO TABS
10.0000 mg | ORAL_TABLET | Freq: Every day | ORAL | Status: DC
  Administered 2024-07-02 – 2024-07-06 (×5): 10 mg via ORAL
  Filled 2024-07-01 (×6): qty 1

## 2024-07-01 MED ORDER — SODIUM CHLORIDE 0.9% FLUSH
3.0000 mL | Freq: Two times a day (BID) | INTRAVENOUS | Status: DC
  Administered 2024-07-01 – 2024-07-02 (×2): 3 mL via INTRAVENOUS

## 2024-07-01 MED ORDER — ENOXAPARIN SODIUM 40 MG/0.4ML IJ SOSY
40.0000 mg | PREFILLED_SYRINGE | INTRAMUSCULAR | Status: DC
Start: 2024-07-01 — End: 2024-07-01

## 2024-07-01 MED ORDER — FUROSEMIDE 10 MG/ML IJ SOLN
40.0000 mg | Freq: Two times a day (BID) | INTRAMUSCULAR | Status: AC
  Administered 2024-07-01 – 2024-07-02 (×2): 40 mg via INTRAVENOUS
  Filled 2024-07-01 (×2): qty 4

## 2024-07-01 MED ORDER — ACETAMINOPHEN 325 MG PO TABS
650.0000 mg | ORAL_TABLET | Freq: Four times a day (QID) | ORAL | Status: DC | PRN
Start: 2024-07-01 — End: 2024-07-02
  Administered 2024-07-02: 650 mg via ORAL
  Filled 2024-07-01: qty 2

## 2024-07-01 MED ORDER — ALBUTEROL SULFATE (2.5 MG/3ML) 0.083% IN NEBU
2.5000 mg | INHALATION_SOLUTION | Freq: Four times a day (QID) | RESPIRATORY_TRACT | Status: DC | PRN
  Administered 2024-07-02: 2.5 mg via RESPIRATORY_TRACT
  Filled 2024-07-01 (×5): qty 3

## 2024-07-01 MED ORDER — ACETAMINOPHEN 650 MG RE SUPP: 650.0000 mg | Freq: Four times a day (QID) | RECTAL | Status: DC | PRN

## 2024-07-01 MED ORDER — METHADONE HCL 10 MG PO TABS
135.0000 mg | ORAL_TABLET | Freq: Every day | ORAL | Status: DC
  Administered 2024-07-02 – 2024-07-06 (×5): 135 mg via ORAL
  Filled 2024-07-01: qty 14
  Filled 2024-07-01: qty 27
  Filled 2024-07-01 (×3): qty 14
  Filled 2024-07-01 (×2): qty 27
  Filled 2024-07-01: qty 14

## 2024-07-01 NOTE — H&P (Addendum)
 History and Physical    Patient: [name] [name] FMW:[id number] DOB: 1961-06-17 DOA: 07/01/2024 DOS: the patient was seen and examined on 07/01/2024 PCP: [name] Jeoffrey RAMAN, FNP  Patient coming from: Home  Chief Complaint:  Chief Complaint  Patient presents with   Lymphedema   Cellulitis   Chest Pain   Shortness of Breath   HPI: [name] [name] is a 63 y.o. 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  He has experienced leg discomfort for three to four years, with a recent exacerbation due to infection starting about a month and a half ago. Despite four courses of antibiotics, including Bactrim  and doxycycline , he continues to experience redness, weakness, and ulcerations.  Significant weight gain has been noted over the past six months, increasing from 254 pounds to 281 pounds, despite unchanged eating habits. He attributes this to lymphedema and is currently on a low dose diuretic.  He experiences shortness of breath and chest pain. Recently diagnosed with sleep apnea, he uses a CPAP machine at night, although he is unsure of the exact settings.  His leg wraps were changed on Thursday, and by Sunday, they were soaked through, with the sores burning to the point that he had to remove the wraps.  He had not noticed any increased redness in his legs has been noticed as he is usually covered.  Upon admission into the emergency department patient was noted to be afebrile with stable vital signs.  Labs noted WBC 10.1, hemoglobin 11.1, BUN 18, creatinine 0.91, lactic acid 1->0.5.  Chest x-ray noted increased interstitial markings in both lung fields with no new consolidation.  Patient had been started on empiric antibiotics of linezolid .  Review of Systems: As mentioned in the  history of present illness. All other systems reviewed and are negative. 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 He has never used smokeless tobacco. He reports current drug use. He reports that he does not drink alcohol.  Allergies  Allergen Reactions   Amlodipine  Swelling    BLE peripheral edema     Family History  Problem Relation Age of Onset   Heart failure Mother    Hypertension Mother    Cancer Mother    Hypertension Father    Depression Sister     Prior to Admission medications   Medication Sig Start Date End Date Taking? Authorizing Provider  albuterol  (VENTOLIN  HFA) 108 (90 Base) MCG/ACT  inhaler Inhale 2 puffs into the lungs every 6 (six) hours as needed for wheezing or shortness of breath. 03/20/24  Yes Howard, Amber S, FNP  BACTRIM  DS 800-160 MG tablet Take 1 tablet by mouth 2 (two) times daily. 06/24/24 07/02/24 Yes [provider]  DULoxetine  (CYMBALTA ) 30 MG capsule Take 1 capsule (30 mg total) by mouth daily. 05/08/24  Yes Howard, Amber S, FNP  gabapentin  (NEURONTIN ) 100 MG capsule Take 3 capsules (300 mg total) by mouth 3 (three) times daily. Patient taking differently: Take 100-900 mg by mouth 3 (three) times daily. 05/08/24  Yes Howard, Amber S, FNP   hydrochlorothiazide  (HYDRODIURIL ) 25 MG tablet TAKE 1 TABLET (25 MG TOTAL) BY MOUTH DAILY. 06/03/24  Yes Howard, Amber S, FNP  loratadine  (CLARITIN ) 10 MG tablet Take 1 tablet (10 mg total) by mouth daily. 03/07/24  Yes Howard, Amber S, FNP  methadone  (DOLOPHINE ) 10 MG/ML solution Take 135 mg by mouth daily.   Yes [provider]  rivaroxaban  (XARELTO ) 20 MG TABS tablet Take 1 tablet (20 mg total) by mouth daily with supper. 03/07/24  Yes Howard, Amber S, FNP  cloNIDine  (CATAPRES ) 0.1 MG tablet Take 1 tablet (0.1 mg total) by mouth 3 (three) times daily. Patient not taking: Reported on 07/01/2024 02/01/24   [name] Jeoffrey RAMAN, FNP  nebivolol  (BYSTOLIC ) 5 MG tablet Take 1 tablet (5 mg total) by mouth daily. Patient not taking: Reported on 07/01/2024 02/01/24   [name] Jeoffrey RAMAN, FNP  valsartan  (DIOVAN ) 320 MG tablet Take 1 tablet (320 mg total) by mouth daily. Patient not taking: Reported on 07/01/2024 03/27/24   [name] Reche RAMAN, NP    Physical Exam: Vitals:   07/01/24 1030 07/01/24 1239  BP: (!) 143/77   Pulse: 83   Resp: 20   Temp: 98.4 F (36.9 C)   SpO2: 94%   Weight:  127.5 kg  Height:  5' 11 (1.803 m)   Constitutional: Elderly obese male currently in no acute distress Eyes: PERRL, lids and conjunctivae normal ENMT: Mucous membranes are moist. Posterior pharynx clear of any exudate or lesions.Normal dentition.  Neck: normal, supple  Respiratory: Decreased overall aeration without significant wheezes or rhonchi appreciated at this time.  O2 saturation currently maintained on room air. Cardiovascular: Regular rate and rhythm, no murmurs / rubs / gallops.  At least 2+ pitting edema of the lower extremities.  2+ pedal pulses. No carotid bruits.  Abdomen: no tenderness, no masses palpated. No hepatosplenomegaly. Bowel sounds positive.  Musculoskeletal: no clubbing / cyanosis. No joint deformity upper and lower extremities. Good ROM, no contractures. Normal muscle tone.  Skin: Venous  stasis noted of the bilateral lower extremities with shallow ulcerations present and serosanguineous drainage present     Neurologic: CN 2-12 grossly intact.  Strength 5/5 in all 4.  Psychiatric: Normal judgment and insight. Alert and oriented x 3. Normal mood.   Data Reviewed:    EKG revealed normal sinus rhythm at 63 bpm.  Reviewed labs, imaging, and pertinent records as documented. Assessment and Plan:  Suspected cellulitis of the bilateral lower extremities Lymphedema Patient has a known history of lymphedema presents with worsening swelling and wounds of the bilateral lower extremities.  Noted to have erythema present with ulcerations of the bilateral lower extremities.  Was previously being treated with multiple rounds of antibiotics including Bactrim  and doxycycline  without improvement in symptoms.  Patient was started on empiric antibiotics of linezolid . - Admit to a telemetry bed - Elevate lower extremities - Check ESR  and CRP - Continue linezolid   - Wound care consulted   Suspected heart failure with preserved EF Patient presents with progressively worsening shortness of breath and reports of weight gain.  On physical exam patient noted to have significant lower extremity swelling.  Last echocardiogram noted EF to be 60 to 65% with no regional wall abnormalities when checked back in 2016. - Strict I&Os and daily weights - Check BNP - Lasix  40 mg IV twice daily x 2 doses.  Reassess and determine need of further IV diuresis  - Check echocardiogram.  Consider need to formally consult cardiology depending on findings in a.m.  Essential hypertension Blood pressures were currently maintained.  Patient had not been on any medications really for treatment of his blood pressure accept for hydrochlorothiazide . - Held hydrochlorothiazide  while diuresing with IV Lasix   COPD Lungs currently clear without significant wheezes appreciated. - Albuterol  nebs as needed  History of DVT and  PE on chronic anticoagulation  Last noted to have been a DVT of the right lower extremity as well as pulmonary embolism back in 2015. 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 - Continue methadone  and Cymbalta   Obesity BMI 39.19 kg/m  Tobacco abuse -Continue to counsel need of cessation of tobacco use - Nicotine  patch  OSA on CPAP - Continue CPAP nightly  DVT prophylaxis: Xarelto  Advance Care Planning:   Code Status: Full Code   Consults: None  Family Communication: None    Severity of Illness: The appropriate patient status for this patient is INPATIENT. Inpatient status is judged to be reasonable and necessary in order to provide the required intensity of service to ensure the patient's safety. The patient's presenting symptoms, physical exam findings, and initial radiographic and laboratory data in the context of their chronic comorbidities is felt to place them at high risk for further clinical deterioration. Furthermore, it is not anticipated that the patient will be medically stable for discharge from the hospital within 2 midnights of admission.   * I certify that at the point of admission it is my clinical judgment that the patient will require inpatient hospital care spanning beyond 2 midnights from the point of admission due to high intensity of service, high risk for further deterioration and high frequency of surveillance required.*  Author: Maximino DELENA Sharps, MD 07/01/2024 1:09 PM  For on call review [email or web address].

## 2024-07-01 NOTE — ED Provider Notes (Signed)
 [place] EMERGENCY DEPARTMENT AT [facility] Provider Note   CSN: [id number] Arrival date & time: 07/01/24  1026     Patient presents with: Lymphedema, Cellulitis, Chest Pain, and Shortness of Breath   [name] [name] is a 63 y.o. male who presents to the ED today presenting from his primary care for concerns of cellulitis of the bilateral lower extremities, has a history of chronic lymphedema of bilateral lower extremities however has had progressively worsening erythema and exudate from the bilateral lower extremities.  He further has concerns of shortness of breath and chest discomfort, but states that he primarily is concerned with the increasing pain and exudate coming from the bilateral legs.  It is notable as patient was on Bactrim  and doxycycline  for 2 rounds to manage potential cellulitis.  Swelling and wound ulcerations have worsened despite antibiotic therapy.    Chest Pain Associated symptoms: shortness of breath   Shortness of Breath      Prior to Admission medications   Medication Sig Start Date End Date Taking? Authorizing Provider  albuterol  (VENTOLIN  HFA) 108 (90 Base) MCG/ACT inhaler Inhale 2 puffs into the lungs every 6 (six) hours as needed for wheezing or shortness of breath. 03/20/24  Yes Howard, Amber S, FNP  BACTRIM  DS 800-160 MG tablet Take 1 tablet by mouth 2 (two) times daily. 06/24/24 07/02/24 Yes [provider]  DULoxetine  (CYMBALTA ) 30 MG capsule Take 1 capsule (30 mg total) by mouth daily. 05/08/24  Yes [name], FNP  gabapentin  (NEURONTIN ) 100 MG capsule Take 3 capsules (300 mg total) by mouth 3 (three) times daily. Patient taking differently: Take 100-900 mg by mouth 3 (three) times daily. 05/08/24  Yes Howard, Amber S, FNP  hydrochlorothiazide  (HYDRODIURIL ) 25 MG tablet TAKE 1 TABLET (25 MG TOTAL) BY MOUTH DAILY. 06/03/24  Yes Howard, Amber S, FNP  loratadine  (CLARITIN ) 10 MG tablet Take 1 tablet (10 mg total) by mouth daily. 03/07/24   Yes Howard, Amber S, FNP  methadone  (DOLOPHINE ) 10 MG/ML solution Take 135 mg by mouth daily.   Yes [provider]  rivaroxaban  (XARELTO ) 20 MG TABS tablet Take 1 tablet (20 mg total) by mouth daily with supper. 03/07/24  Yes Howard, Amber S, FNP  cloNIDine  (CATAPRES ) 0.1 MG tablet Take 1 tablet (0.1 mg total) by mouth 3 (three) times daily. Patient not taking: Reported on 07/01/2024 02/01/24   [name], FNP  nebivolol  (BYSTOLIC ) 5 MG tablet Take 1 tablet (5 mg total) by mouth daily. Patient not taking: Reported on 07/01/2024 02/01/24   [name], FNP  valsartan  (DIOVAN ) 320 MG tablet Take 1 tablet (320 mg total) by mouth daily. Patient not taking: Reported on 07/01/2024 03/27/24   Vannie Reche RAMAN, NP    Allergies: Amlodipine     Review of Systems  Respiratory:  Positive for shortness of breath.   Cardiovascular:  Positive for leg swelling.  Musculoskeletal:  Positive for myalgias.  All other systems reviewed and are negative.   Updated Vital Signs BP (!) 143/77 (BP Location: Left Arm)   Pulse 83   Temp 98.4 F (36.9 C)   Resp 20   Ht 5' 11 (1.803 m)   Wt 127.5 kg   SpO2 94%   BMI 39.19 kg/m   Physical Exam Vitals and nursing note reviewed.  Constitutional:      General: He is not in acute distress.    Appearance: Normal appearance.  HENT:     Head: Normocephalic and atraumatic.  Mouth/Throat:     Mouth: Mucous membranes are moist.     Pharynx: Oropharynx is clear.  Eyes:     Extraocular Movements: Extraocular movements intact.     Conjunctiva/sclera: Conjunctivae normal.     Pupils: Pupils are equal, round, and reactive to light.  Cardiovascular:     Rate and Rhythm: Normal rate and regular rhythm.     Pulses: Normal pulses.     Heart sounds: Normal heart sounds. No murmur heard.    No friction rub. No gallop.     Comments: Unable to palpate dorsalis pedis or posterior tibialis pulses secondary to edema, however he does have less than 2-second  capillary refill on the toes bilaterally. Pulmonary:     Effort: Pulmonary effort is normal.     Breath sounds: Normal breath sounds.  Abdominal:     General: Abdomen is flat. Bowel sounds are normal.     Palpations: Abdomen is soft.  Musculoskeletal:        General: Normal range of motion.     Cervical back: Normal range of motion and neck supple.     Right lower leg: Swelling and tenderness present. 3+ Edema present.     Left lower leg: Swelling and tenderness present. 3+ Edema present.     Right foot: Swelling and tenderness present.     Left foot: Swelling and tenderness present.     Comments: Noted bilateral erythema and edema to the lower extremities, multiple ulcerations as detailed in attached photographs.  Skin:    General: Skin is warm and dry.     Capillary Refill: Capillary refill takes less than 2 seconds.  Neurological:     General: No focal deficit present.     Mental Status: He is alert. Mental status is at baseline.  Psychiatric:        Mood and Affect: Mood normal.          (all labs ordered are listed, but only abnormal results are displayed) Labs Reviewed  COMPREHENSIVE METABOLIC PANEL WITH GFR - Abnormal; Notable for the following components:      Result Value   Glucose, Bld 137 (*)    Calcium  8.6 (*)    Total Protein 6.3 (*)    Albumin 2.7 (*)    AST 14 (*)    All other components within normal limits  CBC WITH DIFFERENTIAL/PLATELET - Abnormal; Notable for the following components:   RBC 3.39 (*)    Hemoglobin 11.1 (*)    HCT 34.9 (*)    MCV 102.9 (*)    All other components within normal limits  I-STAT CG4 LACTIC ACID, ED  I-STAT CG4 LACTIC ACID, ED  TROPONIN I (HIGH SENSITIVITY)  TROPONIN I (HIGH SENSITIVITY)    EKG: None  Radiology: DG Chest 2 View Result Date: 07/01/2024 CLINICAL DATA:  Shortness of breath EXAM: CHEST - 2 VIEW COMPARISON:  Chest radiograph April 19, 2024 FINDINGS: The heart size and mediastinal contours are  accentuated due to low lung volumes . Left basilar atelectasis. No new consolidation. Increased interstitial markings of lung fields . The visualized skeletal structures are unremarkable. Degenerative changes of the spine. Status post ACDF. IMPRESSION: Increased interstitial marking of both lung fields. No new consolidation. Electronically Signed   By: Megan  Zare M.D.   On: 07/01/2024 12:29     Procedures   Medications Ordered in the ED  linezolid  (ZYVOX ) IVPB 600 mg (600 mg Intravenous New Bag/Given 07/01/24 1239)  Medical Decision Making Amount and/or Complexity of Data Reviewed Labs: ordered. Radiology: ordered.  Risk Prescription drug management. Decision regarding hospitalization.   Medical Decision Making:   Serjio Cuccia is a 62 y.o. male who presented to the ED today with bilateral lower extremity edema as well as new onset ulcerations.  Detailed above.    Additional history discussed with patient's family/caregivers.  External chart has been reviewed including previous labs and imaging as well as vascular surgery medical records. Patient's presentation is complicated by their history of chronic lymphedema.  Patient placed on continuous vitals and telemetry monitoring while in ED which was reviewed periodically.  Complete initial physical exam performed, notably the patient  was alert and oriented no apparent distress but visibly uncomfortable.  Physical exam as noted with bilateral lower extremity edema, erythema, and findings consistent with cellulitis..    Reviewed and confirmed nursing documentation for past medical history, family history, social history.    Initial Assessment:   With the patient's presentation of lower extremity edema, most likely diagnosis is cellulitis and stasis ulceration of the lower extremities.  Further consider ACS secondary to chest pain shortness of breath.  Also evaluate for acute pulmonary infection.  Initial  Plan:  As he is failed outpatient therapy, plan to begin on IV antibiotics as noted. Screening labs including CBC and Metabolic panel to evaluate for infectious or metabolic etiology of disease.  CXR to evaluate for structural/infectious intrathoracic pathology.  EKG and serial troponin to evaluate for cardiac pathology. Objective evaluation as below reviewed   Initial Study Results:   Laboratory  All laboratory results reviewed without evidence of clinically relevant pathology.   Exceptions include: Hemoglobin is 11.1 with MCV of 102.9.  EKG EKG was reviewed independently. Rate, rhythm, axis, intervals all examined and without medically relevant abnormality. ST segments without concerns for elevations.    Radiology:  All images reviewed independently. Agree with radiology report at this time.   DG Chest 2 View Result Date: 07/01/2024 CLINICAL DATA:  Shortness of breath EXAM: CHEST - 2 VIEW COMPARISON:  Chest radiograph April 19, 2024 FINDINGS: The heart size and mediastinal contours are accentuated due to low lung volumes . Left basilar atelectasis. No new consolidation. Increased interstitial markings of lung fields . The visualized skeletal structures are unremarkable. Degenerative changes of the spine. Status post ACDF. IMPRESSION: Increased interstitial marking of both lung fields. No new consolidation. Electronically Signed   By: Megan  Zare M.D.   On: 07/01/2024 12:29      Consults: Case discussed with hospitalist team.   Reassessment and Plan:   As this patient has failed outpatient antibiotic therapy and is continuing to have worsening cellulitis and chronic wounds, I have begun this patient on IV linezolid  and consulted with hospitalist team for admission.  After discussion with hospitalist team, they accept this patient for admission for continued antibiotic therapy and monitoring of progression.       Final diagnoses:  Cellulitis of lower extremity, unspecified laterality     ED Discharge Orders     None          Myriam Dorn BROCKS, [place] 07/01/24 1336    Levander Houston, MD 07/04/24 1106

## 2024-07-01 NOTE — ED Notes (Signed)
 Pt transported upstairs with transport with all belongings

## 2024-07-01 NOTE — Progress Notes (Signed)
 Echocardiogram 2D Echocardiogram has been performed.  Tinnie FORBES Gosling RDCS 07/01/2024, 3:47 PM

## 2024-07-01 NOTE — ED Triage Notes (Signed)
 Pt sent here by doctor for lymphedema in BL LE with ulcerations and erythema per paperwork from doctor. PT also endorses SOB and CP.

## 2024-07-01 NOTE — Plan of Care (Signed)

## 2024-07-01 NOTE — Consult Note (Signed)
 WOC Nurse Consult Note: Reason for Consult: leg ulcer and possible cellulitis  Patient with history of lymphedema  Has been followed for LE edema per Dr. CHRISTELLA. [place], vein specialist  Wound type: venous stasis Pressure Injury POA: NA Measurement: see nursing flow sheets Wound bed: weeping; macerated, pink and yellow areas posterior LLE; medial RLE Drainage (amount, consistency, odor) see nursing flow sheets Periwound: edema; venous stasis dermatitis  Dressing procedure/placement/frequency: Cleanse all of the LE wounds with Carolynn Soila # [phone]), pat dry Apply silver hydrofiber to the weeping wounds medial RLE and posterior LLE, top with foam. Wrap from base of toes to the knee with kerlix, followed by 4 Coban Soila [phone]) from base of toes to the knees bilaterally.  Change daily    Discussed POC with hospitalist; will not put in therapeutic compression at this time due to acute onset of cellulitis and worsening of LE wounds.  May need DC orders for Unna's boots after acute episode has been managed.  Re consult if needed, will not follow at this time. Thanks  Nhi Butrum [employer], RN,CWOCN, CNS, [employer] [phone]

## 2024-07-01 NOTE — ED Notes (Signed)
 Echo at bedside

## 2024-07-02 ENCOUNTER — Encounter (HOSPITAL_COMMUNITY)

## 2024-07-02 DIAGNOSIS — I5033 Acute on chronic diastolic (congestive) heart failure: Secondary | ICD-10-CM | POA: Diagnosis not present

## 2024-07-02 DIAGNOSIS — L03119 Cellulitis of unspecified part of limb: Secondary | ICD-10-CM | POA: Diagnosis not present

## 2024-07-02 LAB — COMPREHENSIVE METABOLIC PANEL WITH GFR
ALT: 14 U/L (ref 0–44)
AST: 14 U/L — ABNORMAL LOW (ref 15–41)
Albumin: 2.7 g/dL — ABNORMAL LOW (ref 3.5–5.0)
Alkaline Phosphatase: 71 U/L (ref 38–126)
Anion gap: 8 (ref 5–15)
BUN: 15 mg/dL (ref 8–23)
CO2: 28 mmol/L (ref 22–32)
Calcium: 8.7 mg/dL — ABNORMAL LOW (ref 8.9–10.3)
Chloride: 101 mmol/L (ref 98–111)
Creatinine, Ser: 0.86 mg/dL (ref 0.61–1.24)
GFR, Estimated: >60 mL/min (ref >60)
Glucose, Bld: 97 mg/dL (ref 70–99)
Potassium: 4.3 mmol/L (ref 3.5–5.1)
Sodium: 137 mmol/L (ref 135–145)
Total Bilirubin: 0.7 mg/dL (ref 0.0–1.2)
Total Protein: 6.5 g/dL (ref 6.5–8.1)

## 2024-07-02 LAB — CBC
HCT: 35.6 % — ABNORMAL LOW (ref 39.0–52.0)
Hemoglobin: 11.5 g/dL — ABNORMAL LOW (ref 13.0–17.0)
MCH: 32 pg (ref 26.0–34.0)
MCHC: 32.3 g/dL (ref 30.0–36.0)
MCV: 99.2 fL (ref 80.0–100.0)
Platelets: 264 K/uL (ref 150–400)
RBC: 3.59 MIL/uL — ABNORMAL LOW (ref 4.22–5.81)
RDW: 14.1 % (ref 11.5–15.5)
WBC: 10.8 K/uL — ABNORMAL HIGH (ref 4.0–10.5)
nRBC: 0 % (ref 0.0–0.2)

## 2024-07-02 LAB — MRSA NEXT GEN BY PCR, NASAL: MRSA by PCR Next Gen: NOT DETECTED

## 2024-07-02 MED ORDER — CEFAZOLIN SODIUM-DEXTROSE 2-4 GM/100ML-% IV SOLN: 2.0000 g | Freq: Three times a day (TID) | INTRAVENOUS | Status: DC

## 2024-07-02 MED ORDER — GABAPENTIN 100 MG PO CAPS: 200.0000 mg | ORAL_CAPSULE | Freq: Three times a day (TID) | ORAL | 0 refills | Status: DC

## 2024-07-02 MED ORDER — ACETAMINOPHEN 650 MG RE SUPP
650.0000 mg | Freq: Four times a day (QID) | RECTAL | Status: DC | PRN
  Filled 2024-07-02: qty 1

## 2024-07-02 MED ORDER — FUROSEMIDE 10 MG/ML IJ SOLN
40.0000 mg | Freq: Two times a day (BID) | INTRAMUSCULAR | Status: DC
  Administered 2024-07-02 – 2024-07-06 (×8): 40 mg via INTRAVENOUS
  Filled 2024-07-02 (×10): qty 4

## 2024-07-02 MED ORDER — PNEUMOCOCCAL 20-VAL CONJ VACC 0.5 ML IM SUSY: 0.5000 mL | PREFILLED_SYRINGE | INTRAMUSCULAR | Status: DC

## 2024-07-02 MED ORDER — METHADONE HCL 5 MG PO TABS: 135.0000 mg | ORAL_TABLET | Freq: Every day | ORAL | 0 refills | Status: DC

## 2024-07-02 MED ORDER — NICOTINE POLACRILEX 2 MG MT GUM
2.0000 mg | CHEWING_GUM | OROMUCOSAL | Status: DC | PRN
  Filled 2024-07-02 (×5): qty 1

## 2024-07-02 MED ORDER — DULOXETINE HCL 30 MG PO CPEP: 30.0000 mg | ORAL_CAPSULE | Freq: Every day | ORAL | 0 refills | Status: DC

## 2024-07-02 MED ORDER — ACETAMINOPHEN 325 MG PO TABS
650.0000 mg | ORAL_TABLET | Freq: Four times a day (QID) | ORAL | Status: DC | PRN
  Administered 2024-07-03: 650 mg via ORAL
  Filled 2024-07-02 (×5): qty 2

## 2024-07-02 NOTE — Progress Notes (Signed)
 Arrived to the PT's house as he was being assisted into the home. PT is alert and oriented to his baseline and is able to walk with assistance from his cane and the railing on the stairs.   All equipment was loaded into the house without incident and PT education was given about equipment. Medication requisition was done with Paramedic Hyacinth and RN McWhite. They were placed in tamper resistant bag ([id number]). All medications were placed in appropriate bins for the PT to obtain.   IV was patent and IV lasix  was administered. Dressing was replaced as fluid was leaking from underneath it.   Safety Ax of the home was completed without any major findings. PT's friend will be staying at home with him during this time.

## 2024-07-02 NOTE — Plan of Care (Signed)
 Hospital [facility] Plan of Care/Progress Note   Patient: [name] [name] FMW:[id number] DOB: 16-Apr-1961 DOA: 07/01/2024     1 DOS: the patient was seen and examined on 07/02/2024   Brief hospital course: Clemens [name] is a 63 y.o. 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   He has experienced leg discomfort for three to four years, with a recent exacerbation due to infection starting about a month and a half ago. Despite four courses of antibiotics, including Bactrim  and doxycycline , he continues to experience redness, weakness, and ulcerations.   Significant weight gain has been noted over the past six months, increasing from 254 pounds to 281 pounds, despite unchanged eating habits. He attributes this to lymphedema and is currently on a low dose diuretic.   He experiences shortness of breath and chest pain. Recently diagnosed with sleep apnea, he uses a CPAP machine at night, although he is unsure of the exact settings.   His leg wraps were changed on Thursday, and by Sunday, they were soaked through, with the sores burning to the point that he had to remove the wraps.   He had not noticed any increased redness in his legs has been noticed as he is usually covered.   Admitted for cellulitis complicating bilateral lower extremity lymphedema, venous stasis dermatitis and chronic wounds.  On IV antibiotics.  On evaluation today, pt states some symptomatic improvement in redness and pain-mild. Leg wrapping placed today. Pt reports being able to ambulate fully prior to admission. Still smoking 1-2 packs per [place]. Is interested in quitting.   After extensive discussion at the bedside, pt is agreeable to the hospital at home program.   Assessment and Plan: Cellulitis complicating  bilateral lower extremity lymphedema, venous stasis dermatitis and chronic wounds. Complicated situation Known history of lymphedema P/W worsening swelling and wounds of his bilateral lower extremities, erythema and ulcerations Failed multiple rounds of antibiotics including Bactrim  and doxycycline  without improvement Initially started on IV linezolid , however as per extensive communication with pharmacy on 8/26: Concerned about drug to drug interaction between linezolid  and methadone  is of concern and since no history of MRSA, MRSA PCR negative, nonpurulent cellulitis, per recommendations changed antibiotics to IV cefazolin . WOC RN input from 8/25 appreciated, continue wound care per the recommendations.  Has lower extremity wound dressings along with Ace wrap. Has pitting lower extremity edema up to the groin at least.  IV Lasix  as below. Serial venous lactate negative.  TSH normal.  ESR 100.   Acute on chronic HFpEF Presented with progressively worsening dyspnea and weight gain BNP 28.7.  Serial HS Troponin x 2: Negative. TTE: Due to poor windows, unable to appreciate LVEF or wall motion, has grade 1 diastolic dysfunction.  Repeat limited echo with Definity was recommended, ordered. Continue IV Lasix  40 mg every 12 hours.  Intake output is not accurate.  Weight down from 281 pounds to 276 pounds. Could consider cardiology consultation pending echo results and clinical trajectory.   Essential hypertension  Mostly controlled off of meds.  Monitor   COPD Stable without exacerbation.   History of DVT/PE on chronic anticoagulation Continue home dose of Xarelto    Anxiety and depression Continue Cymbalta    Chronic pain History of substance abuse Prior history of fentanyl  abuse and currently on methadone  at 135mg  daily.  Discussed compliance with chronic  pain center [facility] [phone]) -Pt has been compliant with regular methadone  pick up.  Weekly pick up available for patient   Continue Cymbalta    Tobacco abuse 1-2 ppd smoker Cessation counseled.  Continue nicotine  patch and nicorette  gum    OSA on CPAP Continue CPAP nightly.   Normocytic anemia Stable.       Subjective: Overall mild improvement in redness and tenderness overnight. Bilateral LE wrapping in place.   Physical Exam: Vitals:   07/02/24 0425 07/02/24 0642 07/02/24 0748 07/02/24 1210  BP: 138/71  126/89 (!) 137/100  Pulse: 61  91 63  Resp: 18     Temp: 98.6 F (37 C)  98.8 F (37.1 C) 97.7 F (36.5 C)  TempSrc: Oral     SpO2: 100%  97% 96%  Weight:  125.3 kg    Height:       Physical Exam Constitutional:      Appearance: He is obese.  HENT:     Head: Normocephalic and atraumatic.     Nose: Nose normal.     Mouth/Throat:     Mouth: Mucous membranes are moist.  Eyes:     Pupils: Pupils are equal, round, and reactive to light.  Cardiovascular:     Rate and Rhythm: Normal rate and regular rhythm.  Pulmonary:     Effort: Pulmonary effort is normal.  Abdominal:     General: Bowel sounds are normal.  Musculoskeletal:        General: Normal range of motion.  Skin:    General: Skin is warm.     Comments: Bilateral LE wrapping in place    Neurological:     General: No focal deficit present.  Psychiatric:        Mood and Affect: Mood normal.     Data Reviewed:  There are no new results to review at this time. ECHOCARDIOGRAM COMPLETE    ECHOCARDIOGRAM REPORT       Patient Name:   [name] [name] Date of Exam: 07/01/2024 Medical Rec #:  [id number]  Height:       71.0 in Accession #:    [id number] Weight:       281.1 lb Date of Birth:  08/27/1961  BSA:          2.437 m Patient Age:    63 years   BP:           135/70 mmHg Patient Gender: M          HR:           78 bpm. Exam Location:  Inpatient  Procedure: 2D Echo, Color Doppler and Cardiac Doppler (Both Spectral and Color            Flow Doppler were utilized during procedure).  Indications:    Congestive Heart  Failure I50.9   History:        Patient has prior history of Echocardiogram examinations. CHF,                 COPD; Risk Factors:Hypertension and Dyslipidemia.   Sonographer:    Tinnie Gosling RDCS Referring Phys: [phone] RONDELL A SMITH    Sonographer Comments: Technically difficult study due to poor echo windows and patient is obese. Image acquisition challenging due to patient body habitus and Image acquisition challenging due to COPD. IMPRESSIONS   1. Due to poor windows and porr visualization of the endocardium, EF and wall motion cannot be assessed. Left ventricular endocardial border not optimally defined to evaluate  regional wall motion. There is mild concentric left ventricular hypertrophy.  Left ventricular diastolic parameters are consistent with Grade I diastolic dysfunction (impaired relaxation).  2. Right ventricular systolic function was not well visualized. The right ventricular size is normal. Tricuspid regurgitation signal is inadequate for assessing PA pressure.  3. The mitral valve is normal in structure. No evidence of mitral valve regurgitation. No evidence of mitral stenosis.  4. The aortic valve is normal in structure. Aortic valve regurgitation is not visualized. No aortic stenosis is present.  5. Recommend repeat limited echo with definity contrast.  FINDINGS  Left Ventricle: Due to poor windows and porr visualization of the endocardium, EF and wall motion cannot be assessed. Left ventricular endocardial border not optimally defined to evaluate regional wall motion. The left ventricular internal cavity size  was normal in size. There is mild concentric left ventricular hypertrophy. Left ventricular diastolic parameters are consistent with Grade I diastolic dysfunction (impaired relaxation). Normal left ventricular filling pressure.  Right Ventricle: The right ventricular size is normal. No increase in right ventricular wall thickness. Right ventricular systolic  function was not well visualized. Tricuspid regurgitation signal is inadequate for assessing PA pressure.  Left Atrium: Left atrial size was normal in size.  Right Atrium: Right atrial size was normal in size.  Pericardium: There is no evidence of pericardial effusion.  Mitral Valve: The mitral valve is normal in structure. No evidence of mitral valve regurgitation. No evidence of mitral valve stenosis.  Tricuspid Valve: The tricuspid valve is normal in structure. Tricuspid valve regurgitation is not demonstrated. No evidence of tricuspid stenosis.  Aortic Valve: The aortic valve is normal in structure. Aortic valve regurgitation is not visualized. No aortic stenosis is present.  Pulmonic Valve: The pulmonic valve was not well visualized. Pulmonic valve regurgitation is not visualized. No evidence of pulmonic stenosis.  Aorta: The aortic root is normal in size and structure.  Venous: The inferior vena cava was not well visualized.  IAS/Shunts: No atrial level shunt detected by color flow Doppler.    LEFT VENTRICLE PLAX 2D LVIDd:         5.00 cm   Diastology LVIDs:         3.00 cm   LV e' medial:   10.10 cm/s LV PW:         1.30 cm   LV E/e' medial: 7.1 LV IVS:        1.20 cm LVOT diam:     2.10 cm LVOT Area:     3.46 cm    LEFT ATRIUM         Index LA diam:    4.20 cm 1.72 cm/m    AORTA Ao Root diam: 3.30 cm Ao Asc diam:  3.60 cm  MITRAL VALVE MV Area (PHT): 2.96 cm     SHUNTS MV Decel Time: 256 msec     Systemic Diam: 2.10 cm MV E velocity: 71.40 cm/s MV A velocity: 104.00 cm/s MV E/A ratio:  0.69  Wilbert Bihari MD Electronically signed by Wilbert Bihari MD Signature Date/Time: 07/01/2024/4:49:19 PM      Final   DG Chest 2 View CLINICAL DATA:  Shortness of breath  EXAM: CHEST - 2 VIEW  COMPARISON:  Chest radiograph April 19, 2024  FINDINGS: The heart size and mediastinal contours are accentuated due to low lung volumes . Left basilar atelectasis. No new  consolidation. Increased interstitial markings of lung fields . The visualized skeletal structures are unremarkable. Degenerative changes of the spine.  Status post ACDF.  IMPRESSION: Increased interstitial marking of both lung fields. No new consolidation.  Electronically Signed   By: Megan  Zare M.D.   On: 07/01/2024 12:29  Lab Results  Component Value Date   WBC 10.8 (H) 07/02/2024   HGB 11.5 (L) 07/02/2024   HCT 35.6 (L) 07/02/2024   MCV 99.2 07/02/2024   PLT 264 07/02/2024   Last metabolic panel Lab Results  Component Value Date   GLUCOSE 97 07/02/2024   NA 137 07/02/2024   K 4.3 07/02/2024   CL 101 07/02/2024   CO2 28 07/02/2024   BUN 15 07/02/2024   CREATININE 0.86 07/02/2024   GFRNONAA >60 07/02/2024   CALCIUM  8.7 (L) 07/02/2024   PROT 6.5 07/02/2024   ALBUMIN 2.7 (L) 07/02/2024   BILITOT 0.7 07/02/2024   ALKPHOS 71 07/02/2024   AST 14 (L) 07/02/2024   ALT 14 07/02/2024   ANIONGAP 8 07/02/2024    Family Communication: No family at the bedside   Disposition: Status is: Inpatient Remains inpatient appropriate because: continued need for IV antibiotics   Planned Discharge Destination: Home    Time spent: >60  minutes  Hospital at Home Admission Criteria Checklist:  Formal consent explained in detail and signed at the bedside:yes Patient meets inpatient admission criteria (see below for further details)  Is pt Medicare FFS/[employer] Medicare-Medicaid, [employer], [employer] ( required for initial launch with plan to expand)? yes Lives within 25 mil/ 30 min from [facility] within [place](pt may stay with family member during admission who lives within 25 miles or 30 min from [facility] w/in [facility]) ? yes  Hemodynamically stable with relatively low risk of clinical deterioration-not requiring ICU? yes Age >55?yes Does not require frequent touch-points or complex interventions/medications (ie Titrated Infusions (IV insulin, heparin  drips, vasoactive  drips, use of infused or injectable controlled substances, patients on insulin)? No  Any Behavioral Health comorbidities likely to increase risk for in-home care (ie Acute delirium or experiencing a marked altered mental status and cause is not a treatable condition in the home)? no  Has the patient been on BIPAP during course of ED evaluation or hospitalization?no  IF YES, Has the patient been off of BIPAP for >24 hours(If NO-THEN PATIENT DOES MEET INCLUSION CRITERIA)?n/a Needs oxygen at home (<6L)? no Active safety concerns (ie Unable to use bedside commode independently and lacks caregiver support for safety- needs SNF placement, unable to obtain IV access)? no Has skin check been performed?pending  Has Physical Therapy screened the patient?no- fully ambulatory  Common admission diagnoses including: CAP, COPD Exacerbation, Acute on chronic heart failure, Cellulitis, UTI , dehydration, acute resp failure with hypoxia (requiring <5L)   Social Screening: Denies significant ETOH intake? No ETOH  Does not smoke and understands may not smoke in the presence of oxygen? Yes- no supplemental O2 use- smokes 2PPD   Patient states able to use iPad/phone for communication/has family who is able to use? yes  Patient has agreed to be compliant with medication and treatment regimen of the program?yes Any active drug use in patient or primary caregiver including daily dosing of methadone ?  Regular methadone  use- compliant per chronic pain clinic  Stable home environment ( access to appropriate heating in cold conditions and/or appropriate air conditioning in hot conditions and/or no running water/electricity)? yes  No aggressive pets at home? No  Firearm present  with ability or willingness to store them unloaded in a locked case for duration of hospitalization? no  Ambulatory? yes (independently cane,  walker  wheelchair bound, bed bound) Bed bugs present on home evaluation?no  Family support system in place?  Friend available to stay with patient if needed  Patient feels safe at home does not endorse any violence? Feels safe Any actively decompensated behavioral healthy issues including agitation/aggressive behavior? No    Patient requests food to be provided by hospital home program?no  PT/OT eval completed and not requiring SNF, ALF, inpatient rehab? Fully ambulatory    To be admitted to the [facility] program, a patient generally must meet the following: 1. Requirement for Inpatient Level of Care: The patient's condition must necessitate an inpatient level of care. This is typically indicated by one or more of the following, depending on their specific diagnosis: -Persistent tachycardia despite appropriate treatment (e.g., for Heart Failure, UTI). - Persistent tachypnea (rapid breathing) or dyspnea (shortness of breath) that hasn't improved sufficiently with observation care (e.g., for Heart Failure, Pneumonia, Viral Illness, COVID). - Hypoxemia (low oxygen levels), such as a new need for oxygen, an increased need from baseline, or specific oxygen saturation levels (e.g., SpO2 <90-94% depending on the condition) that persist despite observation (e.g., for Heart Failure, COPD, Pneumonia, Viral Illness, COVID). - Need for Intravenous (IV) hydration due to an inability to maintain oral hydration, which persists despite observation care (e.g., for Cellulitis, UTI, Viral Illness, COVID). - Specific to Heart Failure: Persistent pulmonary edema, indicated by a new oxygen need, lack of improvement with IV diuretics, and ongoing tachypnea/dyspnea. - Specific to COPD: A decrease in known baseline resting oxygen saturation (SpO2) by 4% or more, or an increase in pre-existing supplemental oxygen requirements, which persists despite observation and requires continued close monitoring. - Specific to Pneumonia: A Pneumonia Severity Index (PSI) class IV (moderate risk). - Specific to Cellulitis:  Failure of outpatient antibiotic therapy (indicated by progression or no improvement after a minimum of 48 hours on an adequate regimen) or a clinical presentation (like acuity or rapidity of progression) that requires the intensity of monitoring found in an inpatient setting. - Specific to UTI: Persistence or worsening of clinical findings like fever, pain, or dehydration despite observation care; presence of significant uropathy; suspected infection of an indwelling prosthetic device, stent, implant, or graft; or pregnancy with suspected pyelonephritis. 2. Appropriateness for Hospital at Home Setting: The patient's overall clinical picture, including the severity of their illness, their care needs, and their medical history and comorbidities, must be suitable for management in the Hospital at Home environment. This essentially means that none of the exclusion criteria (listed below) are met.  Unified Exclusion Criteria for Hospital at Home Admission: A patient would not be eligible for Hospital at Home if any of the following are present: - Hemodynamic Instability: - Hypotension (low blood pressure) is present. - Respiratory Instability or Needs Beyond Program Capability: - There is a new need for invasive or noninvasive ventilatory assistance (like BiPAP or a ventilator). - Oxygenation is not sufficient, generally indicated if an FiO2 (fraction of inspired oxygen) of 45% (which is about 6 Liters/minute via nasal cannula) or more is required to keep oxygen saturation (SpO2) at 90% or greater. - Monitoring or Procedural Needs Beyond Program Capability: - There is a need for invasive monitoring, such as a pulmonary artery catheter or an arterial line. - There is a need for immediate-response telemetry monitoring (for dangerous arrhythmia detection and subsequent immediate intervention). - The required medication regimen is beyond the capabilities of Hospital at Home (e.g., dosing  intervals are too frequent for home  administration). - There is a need for a procedure that cannot be performed by the [facility] team (e.g., significant wound debridement or abscess drainage for cellulitis, or percutaneous nephrostomy for a complicated UTI). - Significant Organ Dysfunction or Markers of Severe Illness: - Mental status is not at baseline, or there is altered mental status suggestive of inadequate perfusion. - Renal (kidney) function is unstable or showing an ongoing decline. - There is evidence of inadequate perfusion, such as metabolic acidosis or myocardial ischemia. - Uncompensated acidosis is present. - Condition-Specific Severity or Complications Making Home Care Unsuitable: -For Heart Failure: Known severe cardiac valvular disease (e.g., aortic stenosis, mitral regurgitation); or severe peripheral edema that impairs the ability to urinate or ambulate. -For COPD: Known concurrent comorbidity or finding that indicates a higher-risk COPD exacerbation (e.g., pulmonary fibrosis, cavitation, pleural effusion, pneumothorax, rib fracture). -For Pneumonia: Pneumonia Severity Index (PSI) class V (indicating high risk for inpatient mortality); known concurrent comorbidity or finding that indicates higher-risk pneumonia (e.g., pulmonary fibrosis, cavitation, large or loculated pleural effusion); or a concomitant serious infectious process like endocarditis or empyema. -For Cellulitis: Orbital, periorbital, or necrotizing infection is suspected; or a concomitant serious infectious process like endocarditis, septic emboli, or septic joint space infection. -For UTI: Urinary tract obstruction (e.g., kidney stone, bladder outlet obstruction); or a concomitant serious infectious process like endocarditis or septic emboli. -For Viral Illness & COVID-19: A concomitant serious infectious process like endocarditis or empyema. General Comorbidities or Status: -The patient is  significantly immunosuppressed (this applies to Pneumonia, Cellulitis, UTI, Viral Illness, and COVID-19). -The patient meets inpatient admission criteria for a second diagnosis, or has care needs beyond the capabilities of Hospital at Home due to an active clinically significant comorbidity. (This is a general exclusion across all listed conditions)  Author: Elspeth JINNY Masters, MD 07/02/2024 7:11 PM  For on call review [email or web address].

## 2024-07-02 NOTE — Progress Notes (Signed)
 Arrived in the PT's room to introduce myself and explain the program. PT is Aox4 and is able to answer all questions appropriately. PT is sitting upright in the bed, no obvious distress or trauma noted.   The program and how home visits worked were explained to the PT and all questions were answered. PT stated that at home he has a cane, walker, and a wheelchair if needed for assistance.   Physical exam showed bilateral legs were swollen and tender to touch. They are wrapped in Coban and wound care was done early in the morning.   PT c/o 7/10 pain to his legs and describes it as it feels like I have a bunch of fire ants eating on the sore. PT also c/o nausea and had an episode of vomiting after lunch. PT's RN was notified.   PT was told that [facility] team would be up within the next couple of hours to transport him home.   Paramedic Ion Gonnella spoke with PT's LPN, Tyreka, and obtained the shadow chart.

## 2024-07-02 NOTE — Plan of Care (Signed)
   Problem: Pain Managment: Goal: General experience of comfort will improve and/or be controlled 07/02/2024 2227 by Silvestre Edsel BIRCH, RN Outcome: Not Progressing 07/02/2024 2225 by Silvestre Edsel BIRCH, RN Outcome: Progressing  Problem: Education: Goal: Knowledge of General Education information will improve Description: Including pain rating scale, medication(s)/side effects and non-pharmacologic comfort measures Outcome: Progressing   Problem: Activity: Goal: Risk for activity intolerance will decrease Outcome: Progressing   Problem: Nutrition: Goal: Adequate nutrition will be maintained Outcome: Progressing    Problem: Elimination: Goal: Will not experience complications related to bowel motility Outcome: Progressing Goal: Will not experience complications related to urinary retention Outcome: Progressing  Problem: Safety: Goal: Ability to remain free from injury will improve Outcome: Progressing

## 2024-07-02 NOTE — Plan of Care (Signed)

## 2024-07-02 NOTE — Progress Notes (Addendum)
 1943--Introductory call completed, patient reported he is doing fine, still on couch, friend is present in the home. Identifiers reviewed, assessment completed. Patient agreed to video call closer to 2100 for medication administration assistance and remaining assessment. Encouraged to contact [facility] as needed, contact information reviewed.    2106--Video contact-Patient identifiers review  patient A&O x4. Patient denies current health concerns. Patient rates pain in BLE at 7 refused need for PRN Tylenol . Assessment and 2200 medication administration assistance completed. Overnight monitoring plan  for tomorrow, antibiotics, wound care,  pain management and need for PRN Breathing treatment reviewed. Wearable data slow to filter into the dashboard, wearable adjusted due to Tattoo. Patient encouraged and agreed to wear CPAP at HS. No additional questions or concerns identified.  [facility] contact information, when to call RN vs 911 for emergency needs, s/sx of change in condition reviewed. Patient will continue to be monitored. Patient encouraged to call as needed and will continue to be monitored via current health monitoring.

## 2024-07-02 NOTE — Progress Notes (Signed)
 PROGRESS NOTE   Yaniv Leisner  FMW:[id number]    DOB: 06-11-1961    DOA: 07/01/2024  PCP: [name], FNP   I have briefly reviewed patients previous medical records in [facility].   Brief Hospital Course:  Jojuan Hepp is a 63 y.o. 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   He has experienced leg discomfort for three to four years, with a recent exacerbation due to infection starting about a month and a half ago. Despite four courses of antibiotics, including Bactrim  and doxycycline , he continues to experience redness, weakness, and ulcerations.   Significant weight gain has been noted over the past six months, increasing from 254 pounds to 281 pounds, despite unchanged eating habits. He attributes this to lymphedema and is currently on a low dose diuretic.   He experiences shortness of breath and chest pain. Recently diagnosed with sleep apnea, he uses a CPAP machine at night, although he is unsure of the exact settings.   His leg wraps were changed on Thursday, and by Sunday, they were soaked through, with the sores burning to the point that he had to remove the wraps.   He had not noticed any increased redness in his legs has been noticed as he is usually covered.  Admitted for cellulitis complicating bilateral lower extremity lymphedema, venous stasis dermatitis and chronic wounds.  On IV antibiotics.   Assessment & Plan:   Cellulitis complicating bilateral lower extremity lymphedema, venous stasis dermatitis and chronic wounds. Complicated situation Known history of lymphedema P/W worsening swelling and wounds of his bilateral lower extremities, erythema and ulcerations Failed multiple rounds of antibiotics including Bactrim  and doxycycline   without improvement Initially started on IV linezolid , however as per extensive communication with pharmacy on 8/26: Concerned about drug to drug interaction between linezolid  and methadone  is of concern and since no history of MRSA, MRSA PCR negative, nonpurulent cellulitis, per recommendations changed antibiotics to IV cefazolin . WOC RN input from 8/25 appreciated, continue wound care per the recommendations.  Has lower extremity wound dressings along with Ace wrap. Has pitting lower extremity edema up to the groin at least.  IV Lasix  as below. Serial venous lactate negative.  TSH normal.  ESR 100.  Acute on chronic HFpEF Presented with progressively worsening dyspnea and weight gain BNP 28.7.  Serial HS Troponin x 2: Negative. TTE: Due to poor windows, unable to appreciate LVEF or wall motion, has grade 1 diastolic dysfunction.  Repeat limited echo with Definity was recommended, ordered. Continue IV Lasix  40 mg every 12 hours.  Intake output is not accurate.  Weight down from 281 pounds to 276 pounds. Could consider cardiology consultation pending echo results and clinical trajectory.  Essential hypertension  Mostly controlled off of meds.  Monitor  COPD Stable without exacerbation.  History of DVT/PE on chronic anticoagulation Continue home dose of Xarelto   Anxiety and depression Continue Cymbalta   Chronic pain History of substance abuse Prior history of fentanyl  abuse and currently on methadone , continue.  Continue Cymbalta   Tobacco abuse Cessation counseled.  Continue nicotine  patch  OSA on CPAP Continue CPAP nightly.  Normocytic anemia Stable.   Body mass index is 38.53 kg/m./Class II obesity Complicates care.  Outpatient follow-up.   DVT prophylaxis: rivaroxaban  (XARELTO ) tablet 20 mg Start: 07/02/24 1700     Code Status: Full Code:  Family Communication: None at bedside. Disposition:  Status is: Inpatient Remains inpatient appropriate because: Continued IV  antibiotics, IV Lasix  diuresis, repeat echo, patient however is missing his dogs.  [facility] is evaluating patient and could be considered if he is appropriate for them.     Consultants:   None none.  Procedures:     Subjective:  Nasal stuffiness but no true dyspnea.  No chest pain.  He cannot tell if his legs are any better.  Continues to feel moistness underneath his dressings like before and wound sticking to the dressings.  Did not report pain in his legs.  Dressings were changed last night.  Objective:   Vitals:   07/02/24 0425 07/02/24 0642 07/02/24 0748 07/02/24 1210  BP: 138/71  126/89 (!) 137/100  Pulse: 61  91 63  Resp: 18     Temp: 98.6 F (37 C)  98.8 F (37.1 C) 97.7 F (36.5 C)  TempSrc: Oral     SpO2: 100%  97% 96%  Weight:  125.3 kg    Height:        General exam: Middle-age male, moderately built and obese sitting up comfortably at edge of bed without distress. Respiratory system: Occasional basal crackles but otherwise clear to auscultation. Respiratory effort normal. Cardiovascular system: S1 & S2 heard, RRR. No JVD, murmurs, rubs, gallops or clicks.  1+ pitting edema noted in the upper thigh bilaterally. Gastrointestinal system: Abdomen is nondistended, soft and nontender. No organomegaly or masses felt. Normal bowel sounds heard. Central nervous system: Alert and oriented. No focal neurological deficits. Extremities: Symmetric 5 x 5 power.  Bilateral legs up to the knee has dressings covered with Ace wrap, he reports that these dressings were changed last night.  Clean dry and intact.  Did not open the dressings today to evaluate but can be done tomorrow during dressing change. Skin: No rashes, lesions or ulcers Psychiatry: Judgement and insight appear normal. Mood & affect appropriate.     Data Reviewed:   I have personally reviewed following labs and imaging studies   CBC: Recent Labs  Lab 07/01/24 1057 07/02/24 0403  WBC 10.1 10.8*   NEUTROABS 6.5  --   HGB 11.1* 11.5*  HCT 34.9* 35.6*  MCV 102.9* 99.2  PLT 259 264    Basic Metabolic Panel: Recent Labs  Lab 07/01/24 1057 07/02/24 0403  NA 137 137  K 4.0 4.3  CL 105 101  CO2 23 28  GLUCOSE 137* 97  BUN 18 15  CREATININE 0.91 0.86  CALCIUM  8.6* 8.7*    Liver Function Tests: Recent Labs  Lab 07/01/24 1057 07/02/24 0403  AST 14* 14*  ALT 13 14  ALKPHOS 69 71  BILITOT 0.3 0.7  PROT 6.3* 6.5  ALBUMIN 2.7* 2.7*    CBG: No results for input(s): GLUCAP in the last 168 hours.  Microbiology Studies:  No results found for this or any previous visit (from the past 240 hours).  Radiology Studies:  ECHOCARDIOGRAM COMPLETE Result Date: 07/01/2024    ECHOCARDIOGRAM REPORT   Patient Name:   DAELEN BELVEDERE Date of Exam: 07/01/2024 Medical Rec #:  [id number]  Height:       71.0 in Accession #:    [id number] Weight:       281.1 lb Date of Birth:  1961/09/03  BSA:          2.437 m Patient Age:    63 years   BP:  135/70 mmHg Patient Gender: M          HR:           78 bpm. Exam Location:  Inpatient Procedure: 2D Echo, Color Doppler and Cardiac Doppler (Both Spectral and Color            Flow Doppler were utilized during procedure). Indications:    Congestive Heart Failure I50.9  History:        Patient has prior history of Echocardiogram examinations. CHF,                 COPD; Risk Factors:Hypertension and Dyslipidemia.  Sonographer:    Tinnie Gosling RDCS Referring Phys: [phone] RONDELL A SMITH  Sonographer Comments: Technically difficult study due to poor echo windows and patient is obese. Image acquisition challenging due to patient body habitus and Image acquisition challenging due to COPD. IMPRESSIONS  1. Due to poor windows and porr visualization of the endocardium, EF and wall motion cannot be assessed. Left ventricular endocardial border not optimally defined to evaluate regional wall motion. There is mild concentric left ventricular hypertrophy. Left  ventricular diastolic parameters are consistent with Grade I diastolic dysfunction (impaired relaxation).  2. Right ventricular systolic function was not well visualized. The right ventricular size is normal. Tricuspid regurgitation signal is inadequate for assessing PA pressure.  3. The mitral valve is normal in structure. No evidence of mitral valve regurgitation. No evidence of mitral stenosis.  4. The aortic valve is normal in structure. Aortic valve regurgitation is not visualized. No aortic stenosis is present.  5. Recommend repeat limited echo with definity contrast. FINDINGS  Left Ventricle: Due to poor windows and porr visualization of the endocardium, EF and wall motion cannot be assessed. Left ventricular endocardial border not optimally defined to evaluate regional wall motion. The left ventricular internal cavity size was normal in size. There is mild concentric left ventricular hypertrophy. Left ventricular diastolic parameters are consistent with Grade I diastolic dysfunction (impaired relaxation). Normal left ventricular filling pressure. Right Ventricle: The right ventricular size is normal. No increase in right ventricular wall thickness. Right ventricular systolic function was not well visualized. Tricuspid regurgitation signal is inadequate for assessing PA pressure. Left Atrium: Left atrial size was normal in size. Right Atrium: Right atrial size was normal in size. Pericardium: There is no evidence of pericardial effusion. Mitral Valve: The mitral valve is normal in structure. No evidence of mitral valve regurgitation. No evidence of mitral valve stenosis. Tricuspid Valve: The tricuspid valve is normal in structure. Tricuspid valve regurgitation is not demonstrated. No evidence of tricuspid stenosis. Aortic Valve: The aortic valve is normal in structure. Aortic valve regurgitation is not visualized. No aortic stenosis is present. Pulmonic Valve: The pulmonic valve was not well visualized.  Pulmonic valve regurgitation is not visualized. No evidence of pulmonic stenosis. Aorta: The aortic root is normal in size and structure. Venous: The inferior vena cava was not well visualized. IAS/Shunts: No atrial level shunt detected by color flow Doppler.  LEFT VENTRICLE PLAX 2D LVIDd:         5.00 cm   Diastology LVIDs:         3.00 cm   LV e' medial:   10.10 cm/s LV PW:         1.30 cm   LV E/e' medial: 7.1 LV IVS:        1.20 cm LVOT diam:     2.10 cm LVOT Area:     3.46 cm  LEFT  ATRIUM         Index LA diam:    4.20 cm 1.72 cm/m   AORTA Ao Root diam: 3.30 cm Ao Asc diam:  3.60 cm MITRAL VALVE MV Area (PHT): 2.96 cm     SHUNTS MV Decel Time: 256 msec     Systemic Diam: 2.10 cm MV E velocity: 71.40 cm/s MV A velocity: 104.00 cm/s MV E/A ratio:  0.69 Wilbert Bihari MD Electronically signed by Wilbert Bihari MD Signature Date/Time: 07/01/2024/4:49:19 PM    Final    DG Chest 2 View Result Date: 07/01/2024 CLINICAL DATA:  Shortness of breath EXAM: CHEST - 2 VIEW COMPARISON:  Chest radiograph April 19, 2024 FINDINGS: The heart size and mediastinal contours are accentuated due to low lung volumes . Left basilar atelectasis. No new consolidation. Increased interstitial markings of lung fields . The visualized skeletal structures are unremarkable. Degenerative changes of the spine. Status post ACDF. IMPRESSION: Increased interstitial marking of both lung fields. No new consolidation. Electronically Signed   By: Megan  Zare M.D.   On: 07/01/2024 12:29    Scheduled Meds:    DULoxetine   30 mg Oral Daily   gabapentin   200 mg Oral TID   loratadine   10 mg Oral Daily   methadone   135 mg Oral Daily   nicotine   21 mg Transdermal Daily   [START ON 07/03/2024] pneumococcal 20-valent conjugate vaccine  0.5 mL Intramuscular Tomorrow-1000   rivaroxaban   20 mg Oral Q supper   sodium chloride  flush  3 mL Intravenous Q12H    Continuous Infusions:     ceFAZolin  (ANCEF ) IV       LOS: 1 Kukla     Trenda Mar, MD,   FACP, [facility], [facility], [facility]   [facility]      To contact the attending provider between 7A-7P or the covering provider during after hours 7P-7A, please log into the web site [email or web address] and access using universal [place] password for that web site. If you do not have the password, please call the hospital operator.  07/02/2024, 1:26 PM

## 2024-07-02 NOTE — Progress Notes (Signed)
 Transition of Care [facility]) - Inpatient Brief Assessment   Patient Details  Name: [name] [name] MRN: [id number] Date of Birth: May 18, 1961  Transition of Care [facility]) CM/SW Contact:    Rosaline JONELLE Joe, RN Phone Number: 07/02/2024, 3:33 PM   Clinical Narrative: Patient admitted to the hospital from home alone with cellulitis and lymphedema bilaterally.  Patient states that he has had swelling and infection in his legs over the past 1.5 months.  Patient states that his Vein specialist off [address], [place] has been providing woundcare 1 x per week for bilateral lower extremity dressings.   He states the office was planning on increasing visits to 2 x a week.  Patient states that he does not drive and patient utilizes Medicaid transportation for all appointments including visits to the grocery store.  DME at the home includes CPAP machine on RA, Cane, RW and wheelchair.  Patient with history of fentanyl  abuse 1 year ago and now is prescribed Methadone  for treatment.  Patient is a current smoker but declines use of drugs and ETOH use.  Patient remains in the hospital at this time for IV antibiotics.  PCP is A. Kayla, NP.  CM with IP Care management will continue to follow the patient as he progresses for needs prior to discharge.   Transition of Care Asessment: Insurance and Status: (P) Insurance coverage has been reviewed Patient has primary care physician: (P) Yes Home environment has been reviewed: (P) from home alone Prior level of function:: (P) self Prior/Current Home Services: (P) No current home services (DME at home includes CPAP machine on RA, Cane, RW, WC) Social Drivers of Health Review: (P) SDOH reviewed no interventions necessary Readmission risk has been reviewed: (P) Yes Transition of care needs: (P) transition of care needs identified, TOC will continue to follow

## 2024-07-03 ENCOUNTER — Encounter (HOSPITAL_COMMUNITY): Payer: Self-pay | Admitting: Internal Medicine

## 2024-07-03 ENCOUNTER — Inpatient Hospital Stay (HOSPITAL_COMMUNITY)

## 2024-07-03 ENCOUNTER — Telehealth: Payer: Self-pay | Admitting: Orthopedic Surgery

## 2024-07-03 DIAGNOSIS — L03115 Cellulitis of right lower limb: Secondary | ICD-10-CM | POA: Diagnosis not present

## 2024-07-03 DIAGNOSIS — M799 Soft tissue disorder, unspecified: Secondary | ICD-10-CM | POA: Diagnosis not present

## 2024-07-03 DIAGNOSIS — Z7901 Long term (current) use of anticoagulants: Secondary | ICD-10-CM

## 2024-07-03 DIAGNOSIS — I890 Lymphedema, not elsewhere classified: Secondary | ICD-10-CM | POA: Diagnosis not present

## 2024-07-03 DIAGNOSIS — J449 Chronic obstructive pulmonary disease, unspecified: Secondary | ICD-10-CM | POA: Diagnosis not present

## 2024-07-03 DIAGNOSIS — L03116 Cellulitis of left lower limb: Secondary | ICD-10-CM

## 2024-07-03 DIAGNOSIS — I10 Essential (primary) hypertension: Secondary | ICD-10-CM | POA: Diagnosis not present

## 2024-07-03 DIAGNOSIS — M1712 Unilateral primary osteoarthritis, left knee: Secondary | ICD-10-CM | POA: Diagnosis not present

## 2024-07-03 DIAGNOSIS — I5032 Chronic diastolic (congestive) heart failure: Secondary | ICD-10-CM | POA: Diagnosis not present

## 2024-07-03 DIAGNOSIS — M1612 Unilateral primary osteoarthritis, left hip: Secondary | ICD-10-CM | POA: Diagnosis not present

## 2024-07-03 DIAGNOSIS — I5033 Acute on chronic diastolic (congestive) heart failure: Secondary | ICD-10-CM | POA: Diagnosis not present

## 2024-07-03 DIAGNOSIS — G8929 Other chronic pain: Secondary | ICD-10-CM | POA: Diagnosis not present

## 2024-07-03 DIAGNOSIS — F1190 Opioid use, unspecified, uncomplicated: Secondary | ICD-10-CM

## 2024-07-03 LAB — CBC
HCT: 33.8 % — ABNORMAL LOW (ref 39.0–52.0)
Hemoglobin: 11 g/dL — ABNORMAL LOW (ref 13.0–17.0)
MCH: 32 pg (ref 26.0–34.0)
MCHC: 32.5 g/dL (ref 30.0–36.0)
MCV: 98.3 fL (ref 80.0–100.0)
Platelets: 275 K/uL (ref 150–400)
RBC: 3.44 MIL/uL — ABNORMAL LOW (ref 4.22–5.81)
RDW: 14.1 % (ref 11.5–15.5)
WBC: 8.6 K/uL (ref 4.0–10.5)
nRBC: 0 % (ref 0.0–0.2)

## 2024-07-03 LAB — COMPREHENSIVE METABOLIC PANEL WITH GFR
ALT: 14 U/L (ref 0–44)
AST: 17 U/L (ref 15–41)
Albumin: 2.7 g/dL — ABNORMAL LOW (ref 3.5–5.0)
Alkaline Phosphatase: 65 U/L (ref 38–126)
Anion gap: 11 (ref 5–15)
BUN: 21 mg/dL (ref 8–23)
CO2: 25 mmol/L (ref 22–32)
Calcium: 8 mg/dL — ABNORMAL LOW (ref 8.9–10.3)
Chloride: 99 mmol/L (ref 98–111)
Creatinine, Ser: 0.94 mg/dL (ref 0.61–1.24)
GFR, Estimated: >60 mL/min (ref >60)
Glucose, Bld: 123 mg/dL — ABNORMAL HIGH (ref 70–99)
Potassium: 3.6 mmol/L (ref 3.5–5.1)
Sodium: 135 mmol/L (ref 135–145)
Total Bilirubin: 0.2 mg/dL (ref 0.0–1.2)
Total Protein: 6.3 g/dL — ABNORMAL LOW (ref 6.5–8.1)

## 2024-07-03 LAB — MAGNESIUM: Magnesium: 1.9 mg/dL (ref 1.7–2.4)

## 2024-07-03 MED ORDER — OXYCODONE HCL 5 MG PO TABS
30.0000 mg | ORAL_TABLET | ORAL | Status: DC | PRN
  Administered 2024-07-03 – 2024-07-05 (×5): 30 mg via ORAL
  Filled 2024-07-03 (×5): qty 6

## 2024-07-03 MED ORDER — HYDROMORPHONE HCL 2 MG/ML IJ SOLN: 1.5000 mg | INTRAMUSCULAR | Status: DC | PRN

## 2024-07-03 MED ORDER — POTASSIUM CHLORIDE CRYS ER 20 MEQ PO TBCR
40.0000 meq | EXTENDED_RELEASE_TABLET | Freq: Once | ORAL | Status: AC
  Administered 2024-07-03: 40 meq via ORAL
  Filled 2024-07-03: qty 2

## 2024-07-03 MED ORDER — METOLAZONE 2.5 MG PO TABS
2.5000 mg | ORAL_TABLET | Freq: Two times a day (BID) | ORAL | Status: DC
  Administered 2024-07-04 – 2024-07-06 (×5): 2.5 mg via ORAL
  Filled 2024-07-03 (×6): qty 1

## 2024-07-03 MED ORDER — HYDROMORPHONE HCL 1 MG/ML IJ SOLN
1.5000 mg | INTRAMUSCULAR | Status: DC | PRN
  Administered 2024-07-03 – 2024-07-06 (×4): 1.5 mg via INTRAVENOUS
  Filled 2024-07-03 (×4): qty 1.5

## 2024-07-03 MED ORDER — OXYCODONE HCL 5 MG PO TABS: 20.0000 mg | ORAL_TABLET | Freq: Once | ORAL | Status: DC

## 2024-07-03 MED ORDER — IOHEXOL 350 MG/ML SOLN
75.0000 mL | Freq: Once | INTRAVENOUS | Status: AC | PRN
  Administered 2024-07-03: 75 mL via INTRAVENOUS

## 2024-07-03 MED ORDER — SODIUM CHLORIDE 0.9 % IV SOLN
2.0000 g | INTRAVENOUS | Status: DC
  Administered 2024-07-03: 2 g via INTRAVENOUS
  Filled 2024-07-03 (×2): qty 20

## 2024-07-03 MED ORDER — CEFAZOLIN SODIUM-DEXTROSE 2-4 GM/100ML-% IV SOLN
2.0000 g | Freq: Three times a day (TID) | INTRAVENOUS | Status: DC
  Administered 2024-07-03 – 2024-07-06 (×9): 2 g via INTRAVENOUS
  Filled 2024-07-03 (×9): qty 100

## 2024-07-03 NOTE — Progress Notes (Signed)
 Post hospital f/u appointment arranged by IP CM and noted on AVS, pt made aware. [name], FNP PCP - General Family Medicine [phone] [address] [place] [address]     Next Steps: Follow up on 07/10/2024 Instructions: Hospital follow up appointment arranged for 07/10/2024 at 3:45 pm. Please arrive 15 minutes before your appointment time.  Jon Hoit RN,CM [phone]

## 2024-07-03 NOTE — Assessment & Plan Note (Addendum)
 07-03-2024 continue cymbalta   07-04-2024 continue cymbalta .  07-05-2024 continue cymbalta .  08-30-205 stay on cymbalta  at discharge.

## 2024-07-03 NOTE — Assessment & Plan Note (Addendum)
 07-03-2024 stable. On RA.  07-04-2024 stable.  07-05-2024 stable.  07-06-2024 stable.

## 2024-07-03 NOTE — Hospital Course (Signed)
 CC: lymphedema, cellulitis, SOB, CP HPI: [name] [name] is a 63 y.o. 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   He has experienced leg discomfort for three to four years, with a recent exacerbation due to infection starting about a month and a half ago. Despite four courses of antibiotics, including Bactrim  and doxycycline , he continues to experience redness, weakness, and ulcerations.   Significant weight gain has been noted over the past six months, increasing from 254 pounds to 281 pounds, despite unchanged eating habits. He attributes this to lymphedema and is currently on a low dose diuretic.   He experiences shortness of breath and chest pain. Recently diagnosed with sleep apnea, he uses a CPAP machine at night, although he is unsure of the exact settings.   His leg wraps were changed on Thursday, and by Sunday, they were soaked through, with the sores burning to the point that he had to remove the wraps.   He had not noticed any increased redness in his legs has been noticed as he is usually covered.   Upon admission into the emergency department patient was noted to be afebrile with stable vital signs.  Labs noted WBC 10.1, hemoglobin 11.1, BUN 18, creatinine 0.91, lactic acid 1->0.5.  Chest x-ray noted increased interstitial markings in both lung fields with no new consolidation.  Patient had been started on empiric antibiotics of linezolid.  Significant Events: Admitted 07/01/2024 bilateral LE cellulitis 07-02-2024 pt admitted to [facility] program 07-03-2024 due to worsening edema, pain and cellulitis, pt needed to be brought back to hospital for in-house treatment.  Admission Labs: WBC 10.1, HgB 11.1, plt 259 Na 137, K 4.0, CO2 of 23, BUN 18, Scr 0.91,  Glu 137 T prot 6.3, Alb 2.7, AST 14, ALT 13, alk phos 69, T. Bili 0.3 Lactic acid 0.5 BNP 28.7 TSH 1.609 ESR 100  Admission Imaging Studies: CXR Increased interstitial marking of both lung fields. No new consolidation.  Significant Labs: 07-02-2024 MRSA screen Negative 07-04-2024 ESR down to 53. From admission level of 100  Significant Imaging Studies: 07-03-2024 CT left LE negative abscess or necrotizing fasciitis 07-04-2024 bilateral LE U/S negative for DVT  Antibiotic Therapy: Anti-infectives (From admission, onward)    Start     Dose/Rate Route Frequency Ordered Stop   07/03/24 0900  cefTRIAXone (ROCEPHIN) 2 g in sodium chloride 0.9 % 100 mL IVPB        2 g 200 mL/hr over 30 Minutes Intravenous Every 24 hours 07/03/24 0705     07/02/24 2200  ceFAZolin (ANCEF) IVPB 2g/100 mL premix  Status:  Discontinued        2 g 200 mL/hr over 30 Minutes Intravenous Every 8 hours 07/02/24 1152 07/02/24 1413   07/01/24 2200  linezolid (ZYVOX) IVPB 600 mg  Status:  Discontinued        600 mg 300 mL/hr over 60 Minutes Intravenous Every 12 hours 07/01/24 1346 07/02/24 1150   07/01/24 1230  linezolid (ZYVOX) IVPB 600 mg        60 0 mg 300 mL/hr over 60 Minutes Intravenous  Once 07/01/24 1218 07/01/24 1345       Procedures:   Consultants:

## 2024-07-03 NOTE — Subjective & Objective (Addendum)
 Pt seen and examined. Pt did not get weighed today. He is ready to go home. He is very happy with his progress.

## 2024-07-03 NOTE — Assessment & Plan Note (Addendum)
 Date Right Leg Left Leg  04-19-2024       07-01-2024       07-02-2024       07-03-2024            07-03-2024 After returning to hospital       07-04-2024       07-05-2024       07-06-2024            07-03-2024 Remains on IV lasix  to help with LE edema. Not acute CHF. BNP was normal on admission. Currently on Schlegel #1 of IV Rocephin . Pt completed 24 hours of Zyvox . I have contacted Dr. Crist office(orthopedics) to see if pt can be seen in the office for clinical trial on wound care.  07-04-2024 pt now on augmented diuresis with IV lasix  40 mg bid and po metolazone  2.5 mg bid. Now on Loa #2 of IV ancef  2 gram q8. I estimate he has about another 10-20 lbs of fluid retention to diurese. His legs look better with less edema in his thighs today. I think another reason for his worsening cellulitis is due his unsanitary home environment. Pt with very dirty carpets and multiple dogs running around his home. I can only estimate how dirty his wounds become with his dogs running around at the level of his legs where his wounds are.  Continue with IV dilaudid  and po oxycodone  prn for pain control.  07-05-2024 ASO titer pending. Continue with aggressive diuresis with IV lasix  and po metolazone . Pt has lost nearly 20 lbs in water weight gain. Scr is stable. Continue with IV Ancef  2 gram q8h. Pt is anxious to get home so he can pay his bills. Discussed potential of discharge tomorrow if his diuresis continues nicely and his wounds continue to improve. He has ortho appointment for wound care on Tuesday, July 08, 2024.  07-06-2024 wound healing progressing nicely. He wants to go home. I think that this is reasonable. He has diuresed well over 20 lbs since admission. Wound healing is progressing very nicely. He has appointment with orthopedics for wound care on Tuesday, July 09, 2024 with Dr. Crist office. Will discharge him on Duricef 1000 mg bid x 7 days and continue him on  lasix  40 mg daily. He is to start using his lymphedema wraps/pumps on both his legs a minimum of twice a Lemieux. Continue with dressing changes once a Fils. I recommended he wash his wounds with antibacterial soap and warm water to remove the dead skin layers and wound crust. He is to keep his dogs away from his wounds. Stop smoking.  Inflammatory state from his cellulitis is improving. Admit ESR was 100. Now down to 63.

## 2024-07-03 NOTE — Progress Notes (Signed)
 Phone number for Caron Graff [phone], Pt's friend who is staying at his home will let us  in to pick up equipment tomorrow.

## 2024-07-03 NOTE — Assessment & Plan Note (Addendum)
 07-03-2024 continue with chronic daily methadone .  Dosage verified by [facility] admitting provider with his pain clinic.  07-04-2024 continue methadone  135 mg daily. On IV dilaudid  prn and po oxycodone  prn for pain associated with his open wounds and cellulitis.  07-05-2024 continue with methadone  135 mg daily. He states he has been going daily to methadone  clinic to prevent any temptation at home of taking extra doses. Continue with IV dilaudid  prn.   07-06-2024 will provide Rx for methadone  135 mg daily for 3 days(sun, mon, Tuesday). He has appointment for ortho on Tuesday. He can go back to methadone  clinic on Wednesday.

## 2024-07-03 NOTE — Telephone Encounter (Signed)
 Pt called back. He is scheduled on 07/09/24 ( we are closed for labor [place]). He couldn't do any earlier due to transportation.

## 2024-07-03 NOTE — Assessment & Plan Note (Addendum)
 07-03-2024 pt still smoking. I asked for him to stop smoking to allow for his legs to heal.  07-04-2024 pt refusing nicotine  patches.  07-05-2024 stable.  07-06-2024 pt advised to stop smoking.

## 2024-07-03 NOTE — Progress Notes (Signed)
 Patient transferred to the [facility] program. Communicated with patient via video tablet. AAOx4. He reports 7/10 b/l leg pain, refused pain medication at this time. Denies headache, dizziness, and SOB. Room air. Plan of care reviewed with patient. Patient was told that the virtual nurse is available 24/7. [facility] phone number given to patient. Tablet usage explained. Medication reconciliation completed with [place], paramedic. Patient agreeable with plan of care.

## 2024-07-03 NOTE — Progress Notes (Signed)
  X-cover Note: CT left lower leg negative for abscess or necrotizing fasciitis. proceed with aggressive diuresis and abx change to iv ancef    Camellia Door, DO [facility]

## 2024-07-03 NOTE — Consult Note (Signed)
 WOC Nurse Consult Note: Reason for Consult: patient transferred to [facility]  history or lymphedema  Has been followed for LE edema per Dr. CHRISTELLA. [place], vein specialist  Wound type: venous stasis Pressure Injury POA: NA Measurement: see nursing flow sheets Wound bed: wounds appear a little less moist, will change silver to xeroform.  Drainage (amount, consistency, odor) see nursing flow sheets Periwound: edema; venous stasis dermatitis  Dressing procedure/placement/frequency: Cleanse all of the LE wounds with Vashe Soila # [id number]), pat dry Apply single layer of xeroform to the weeping wounds medial RLE and posterior LLE, top with foam. Wrap from base of toes to the knee with kerlix, followed by 4 Coban Soila [phone]) from base of toes to the knees bilaterally.  Change daily  Could benefit from Unna's boot application would arrange for patient to see Dr. Catherine to re-apply. Unna's boots are not a skill set the EMT/paramedics can provide    Re consult if needed, will not follow at this time. Thanks  Saima Monterroso [employer], RN,CWOCN, CNS, [employer] [phone]

## 2024-07-03 NOTE — Assessment & Plan Note (Addendum)
 07-03-2024 stable.  07-04-2024 stable.  07-05-2024 stable.  07-06-2024 stable.

## 2024-07-03 NOTE — Assessment & Plan Note (Addendum)
 07-03-2024 not in acute CHF. IV lasix  for his LE lymphedema.  07-04-2024 continue with IV lasix  for his lymphedema/LE edema. Pt is not in acute CHF.  07-05-2024 not in acute CHF. On IV lasix  for his lymphedema.  07-06-2024 change to po lasix  40 mg daily at discharge. Stop hydrochlorothiazide .

## 2024-07-03 NOTE — Progress Notes (Signed)
 Hospital at Home PROGRESS NOTE    [name] [name]  FMW:[id number] DOB: 1961-01-15 DOA: 07/01/2024 PCP: [name], FNP  Subjective: Pt seen during video visit today. Pt's carpet at home visibly soiled. Could hear dogs barking inside him home. Paramedic reporting pt still smoking.    Provider Location: [place], [place] Patient examined by: Sarah(paramedic), Nicole(EMT)  Hospital Course: CC: lymphedema, cellulitis, SOB, CP HPI: [name] [name] is a 63 y.o. 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   He has experienced leg discomfort for three to four years, with a recent exacerbation due to infection starting about a month and a half ago. Despite four courses of antibiotics, including Bactrim  and doxycycline , he continues to experience redness, weakness, and ulcerations.   Significant weight gain has been noted over the past six months, increasing from 254 pounds to 281 pounds, despite unchanged eating habits. He attributes this to lymphedema and is currently on a low dose diuretic.   He experiences shortness of breath and chest pain. Recently diagnosed with sleep apnea, he uses a CPAP machine at night, although he is unsure of the exact settings.   His leg wraps were changed on Thursday, and by Sunday, they were soaked through, with the sores burning to the point that he had to remove the wraps.   He had not noticed any increased redness in his legs has been noticed as he is usually covered.   Upon admission into the emergency department patient was noted to be afebrile with stable vital signs.  Labs noted WBC 10.1, hemoglobin 11.1, BUN 18, creatinine 0.91, lactic acid 1->0.5.  Chest x-ray noted increased interstitial markings in both lung fields with no new  consolidation.  Patient had been started on empiric antibiotics of linezolid.  Significant Events: Admitted 07/01/2024 bilateral LE cellulitis   Admission Labs: WBC 10.1, HgB 11.1, plt 259 Na 137, K 4.0, CO2 of 23, BUN 18, Scr 0.91, Glu 137 T prot 6.3, Alb 2.7, AST 14, ALT 13, alk phos 69, T. Bili 0.3 Lactic acid 0.5 BNP 28.7 TSH 1.609 ESR 100  Admission Imaging Studies: CXR Increased interstitial marking of both lung fields. No new consolidation.  Significant Labs: 07-02-2024 MRSA screen Negative  Significant Imaging Studies:   Antibiotic Therapy: Anti-infectives (From admission, onward)    Start     Dose/Rate Route Frequency Ordered Stop   07/03/24 0900  cefTRIAXone (ROCEPHIN) 2 g in sodium chloride 0.9 % 100 mL IVPB        2 g 200 mL/hr over 30 Minutes Intravenous Every 24 hours 07/03/24 0705     07/02/24 2200  ceFAZolin (ANCEF) IVPB 2g/100 mL premix  Status:  Discontinued        2 g 200 mL/hr over 30 Minutes Intravenous Every 8 hours 07/02/24 1152 07/02/24 1413   07/01/24 2200  linezolid (ZYVOX) IVPB 600 mg  Status:  Discontinued        600 mg 300 mL/hr over 60 Minutes Intravenous Every 12 hours 07/01/24 1346 07/02/24 1150   07/01/24 1230  linezolid (ZYVOX) IVPB 600 mg        60 0 mg 300 mL/hr over 60 Minutes Intravenous  Once 07/01/24 1218 07/01/24 1345       Procedures:   Consultants:     Assessment and Plan: * Bilateral lower leg cellulitis Date Right Leg  Left Leg  04-19-2024       07-01-2024       07-02-2024       07-03-2024              07-03-2024 Remains on IV lasix  to help with LE edema. Not acute CHF. BNP was normal on admission. Currently on Dorey #1 of IV Rocephin . Pt completed 24 hours of Zyvox . I have contacted Dr. Crist office(orthopedics) to see if pt can be seen in the office for clinical trial on wound care.  Lymphedema 07-03-2024 pt will need to get his Unna boots placed back on his legs at vascular surgery office  after he is discharged.  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 gets methadone  from [facility] [phone]. current dose of methadone  135 mg daily. 07-03-2024 continue with chronic daily methadone .  Dosage verified by [facility] admitting provider with his pain clinic.  OSA on CPAP 07-03-2024 stable.  (HFpEF) heart failure with preserved ejection fraction (HCC) 07-03-2024 not in acute CHF. IV lasix  for his LE lymphedema.  Chronic anticoagulation - due to prior PE and DVT 07-03-2024 continue Xarelto   Anxiety and depression 07-03-2024 continue cymbalta   Obesity, Class II, BMI 35-39.9 07-03-2024 Body mass index is 38.03 kg/m.   Chronic pain 07-03-2024 on methadone  135 mg daily. Dosage verified by [facility] admitting provider with his pain clinic.  Tobacco abuse 07-03-2024 pt still smoking. I asked for him to stop smoking to allow for his legs to heal.  COPD (chronic obstructive pulmonary disease) (HCC) 07-03-2024 stable. On RA.  Essential hypertension 07-03-2024 stable. Holding hydrochlorothiazide  while he is on IV lasix .  DVT prophylaxis:  rivaroxaban  (XARELTO ) tablet 20 mg     Code Status: Full Code Family Communication: no family during video call. Pt is decisional Disposition Plan: home Reason for continuing need for hospitalization: remains on IV Abx.  Objective: Vitals:   07/03/24 0130 07/03/24 0317 07/03/24 0411 07/03/24 1100  BP:    133/84  Pulse: 70 75 86 75  Resp: 16 20 12 16   Temp:    98 F (36.7 C)  TempSrc:    Oral  SpO2: 94% 94% 95% 94%  Weight:      Height:       No intake or output data in the 24 hours ending 07/03/24 1250 Filed Weights   07/01/24 1434 07/02/24 0642 07/02/24 1800  Weight: 127.5 kg 125.3 kg 123.7 kg    Examination: Note that physical examination performed by paramedic on-scene and documented by provider Video Exam performed by video enabled technology  Physical Exam Vitals  and nursing note reviewed.  Constitutional:      Appearance: He is obese.  HENT:     Head: Normocephalic and atraumatic.  Pulmonary:     Effort: Pulmonary effort is normal.  Skin:    Comments: See pictures of his legs  Neurological:     Mental Status: He is oriented to person, place, and time.            Data Reviewed: I have personally reviewed following labs and imaging studies  CBC: Recent Labs  Lab 07/01/24 1057 07/02/24 0403  WBC 10.1 10.8*  NEUTROABS 6.5  --   HGB 11.1* 11.5*  HCT 34.9* 35.6*  MCV 102.9* 99.2  PLT 259 264   Basic Metabolic Panel: Recent Labs  Lab 07/01/24 1057 07/02/24 0403  NA 137 137  K 4.0 4.3  CL 105 101  CO2  23 28  GLUCOSE 137* 97  BUN 18 15  CREATININE 0.91 0.86  CALCIUM  8.6* 8.7*   GFR: Estimated Creatinine Clearance: 119.3 mL/min (by C-G formula based on SCr of 0.86 mg/dL). Liver Function Tests: Recent Labs  Lab 07/01/24 1057 07/02/24 0403  AST 14* 14*  ALT 13 14  ALKPHOS 69 71  BILITOT 0.3 0.7  PROT 6.3* 6.5  ALBUMIN 2.7* 2.7*   BNP (last 3 results) Recent Labs    03/06/24 1510 04/19/24 0832 07/01/24 1057  BNP 109* 46.2 28.7   Thyroid  Function Tests: Recent Labs    07/01/24 1430  TSH 1.609   Sepsis Labs: Recent Labs  Lab 07/01/24 1113 07/01/24 1300  LATICACIDVEN 1.0 0.5    Recent Results (from the past 240 hours)  MRSA Next Gen by PCR, Nasal     Status: None   Collection Time: 07/02/24  1:57 PM   Specimen: Nasal Mucosa; Nasal Swab  Result Value Ref Range Status   MRSA by PCR Next Gen NOT DETECTED NOT DETECTED Final    Comment: (NOTE) The GeneXpert MRSA Assay (FDA approved for NASAL specimens only), is one component of a comprehensive MRSA colonization surveillance program. It is not intended to diagnose MRSA infection nor to guide or monitor treatment for MRSA infections. Test performance is not FDA approved in patients less than 66 years old. Performed at [facility], [address] [place], [place] [address]      Radiology Studies: ECHOCARDIOGRAM COMPLETE Result Date: 07/01/2024    ECHOCARDIOGRAM REPORT   Patient Name:   [name] [name] Date of Exam: 07/01/2024 Medical Rec #:  [id number]  Height:       71.0 in Accession #:    [id number] Weight:       281.1 lb Date of Birth:  1961/02/10  BSA:          2.437 m Patient Age:    63 years   BP:           135/70 mmHg Patient Gender: M          HR:           78 bpm. Exam Location:  Inpatient Procedure: 2D Echo, Color Doppler and Cardiac Doppler (Both Spectral and Color            Flow Doppler were utilized during procedure). Indications:    Congestive Heart Failure I50.9  History:        Patient has prior history of Echocardiogram examinations. CHF,                 COPD; Risk Factors:Hypertension and Dyslipidemia.  Sonographer:    Tinnie Gosling RDCS Referring Phys: [phone] RONDELL A SMITH  Sonographer Comments: Technically difficult study due to poor echo windows and patient is obese. Image acquisition challenging due to patient body habitus and Image acquisition challenging due to COPD. IMPRESSIONS  1. Due to poor windows and porr visualization of the endocardium, EF and wall motion cannot be assessed. Left ventricular endocardial border not optimally defined to evaluate regional wall motion. There is mild concentric left ventricular hypertrophy. Left ventricular diastolic parameters are consistent with Grade I diastolic dysfunction (impaired relaxation).  2. Right ventricular systolic function was not well visualized. The right ventricular size is normal. Tricuspid regurgitation signal is inadequate for assessing PA pressure.  3. The mitral valve is normal in structure. No evidence of mitral valve regurgitation. No evidence of mitral stenosis.  4. The aortic valve is normal in  structure. Aortic valve regurgitation is not visualized. No aortic stenosis is present.  5. Recommend repeat limited echo with definity contrast. FINDINGS  Left Ventricle:  Due to poor windows and porr visualization of the endocardium, EF and wall motion cannot be assessed. Left ventricular endocardial border not optimally defined to evaluate regional wall motion. The left ventricular internal cavity size was normal in size. There is mild concentric left ventricular hypertrophy. Left ventricular diastolic parameters are consistent with Grade I diastolic dysfunction (impaired relaxation). Normal left ventricular filling pressure. Right Ventricle: The right ventricular size is normal. No increase in right ventricular wall thickness. Right ventricular systolic function was not well visualized. Tricuspid regurgitation signal is inadequate for assessing PA pressure. Left Atrium: Left atrial size was normal in size. Right Atrium: Right atrial size was normal in size. Pericardium: There is no evidence of pericardial effusion. Mitral Valve: The mitral valve is normal in structure. No evidence of mitral valve regurgitation. No evidence of mitral valve stenosis. Tricuspid Valve: The tricuspid valve is normal in structure. Tricuspid valve regurgitation is not demonstrated. No evidence of tricuspid stenosis. Aortic Valve: The aortic valve is normal in structure. Aortic valve regurgitation is not visualized. No aortic stenosis is present. Pulmonic Valve: The pulmonic valve was not well visualized. Pulmonic valve regurgitation is not visualized. No evidence of pulmonic stenosis. Aorta: The aortic root is normal in size and structure. Venous: The inferior vena cava was not well visualized. IAS/Shunts: No atrial level shunt detected by color flow Doppler.  LEFT VENTRICLE PLAX 2D LVIDd:         5.00 cm   Diastology LVIDs:         3.00 cm   LV e' medial:   10.10 cm/s LV PW:         1.30 cm   LV E/e' medial: 7.1 LV IVS:        1.20 cm LVOT diam:     2.10 cm LVOT Area:     3.46 cm  LEFT ATRIUM         Index LA diam:    4.20 cm 1.72 cm/m   AORTA Ao Root diam: 3.30 cm Ao Asc diam:  3.60 cm MITRAL VALVE MV  Area (PHT): 2.96 cm     SHUNTS MV Decel Time: 256 msec     Systemic Diam: 2.10 cm MV E velocity: 71.40 cm/s MV A velocity: 104.00 cm/s MV E/A ratio:  0.69 [name] Bihari MD Electronically signed by [name] Bihari MD Signature Date/Time: 07/01/2024/4:49:19 PM    Final     Scheduled Meds:  DULoxetine   30 mg Oral Daily   furosemide   40 mg Intravenous BID   gabapentin   200 mg Oral TID   loratadine   10 mg Oral Daily   methadone   135 mg Oral Daily   nicotine   21 mg Transdermal Daily   rivaroxaban   20 mg Oral Q supper   Continuous Infusions:  cefTRIAXone  (ROCEPHIN )  IV Stopped (07/03/24 1203)     LOS: 2 days   Time spent: 55 minutes  Camellia Door, DO  [facility]  07/03/2024, 12:50 PM

## 2024-07-03 NOTE — Progress Notes (Signed)
 Spoke with the patient on the video tablet this morning. He states he had a good night. Aox4. He reports 9/10 b/l leg pain. Pt requesting tylenol  which was self administered and witnessed as ordered. Denies SOB. Room air. Pt states he normally takes his Methadone  early in the morning. Pt was witnessed taking his prescribed methadone  as ordered. Discussed the plan for today with the patient. No questions or concerns at this time. Pt agreeable with plan of care.

## 2024-07-03 NOTE — Progress Notes (Addendum)
   Hospital at Home Note:  Another video visit this afternoon. I was based in [place], [place]. Pt was at his home. Paramedic sarah at pt's home.  Paramedic reports increased erythema, warmth and edema of left leg. Bandage removed. Pt states he took a nap with his leg elevated. He shows me the wedge pillow he had underneath his legs.  Paramedic reports increase in edema of left leg only. Pt denies missing any doses of his Xarelto . While I think the possibility of DVT is low, I cannot dismiss him having isolated swelling of his left leg.  Pt states that his edema of his toes/feet have improved.  Have ordered STAT bilateral LE US  to r/o DVT. Have also ordered 20 mg po oxycodone  for pain when pt arrival to [facility] to have his LE U/S performed.  Paramedic took additional pictures today of his left leg           Camellia Door, DO [facility]

## 2024-07-03 NOTE — Assessment & Plan Note (Addendum)
 07-03-2024 Body mass index is 38.03 kg/m.

## 2024-07-03 NOTE — Plan of Care (Signed)
  Problem: Education: Goal: Knowledge of General Education information will improve Description: Including pain rating scale, medication(s)/side effects and non-pharmacologic comfort measures Outcome: Progressing   Problem: Elimination: Goal: Will not experience complications related to bowel motility Outcome: Progressing   Problem: Elimination: Goal: Will not experience complications related to urinary retention Outcome: Progressing   Problem: Pain Managment: Goal: General experience of comfort will improve and/or be controlled Outcome: Progressing   Problem: Safety: Goal: Ability to remain free from injury will improve Outcome: Progressing   Problem: Skin Integrity: Goal: Skin integrity will improve Outcome: Progressing

## 2024-07-03 NOTE — Progress Notes (Addendum)
 Arrived at Pt's home for routine evening HatH visit. Upon entering, I found pt seated on couch rubbing at his left leg. When I asked him how he was feeling today he said not good. Pt reports pain in left leg only, medially and into his popliteal area of the left leg rather than his wounds which are located mainly posteriorly. Pt states he was napping earlier with feet elevated on wedge pillow. After waking up and beginning to move, Pt started feeling 10/10 pain. I noticed above bandage, his skin looked more swollen with brighter erythema. I also noted swelling to the left posterior thigh as well. Pt's anterior/ medial thigh does not appear to have much swelling. I removed Pt's bandage and took photos via rover uploaded to epic. Pt's leg appears to have increased redness compared to this morning. I informed virtual RN who informed Dr. Laurence. Dr. Laurence ordered U/S and pt transported to [facility] via [facility]. Pt brought to infusion clinic to wait for U/S.   EMT Franchot met me and waited with pt. Pt evaluated by Dr. Laurence. Left leg re-bandaged with xeroform, kerlix and ace bandage. Pt being escalated back to [facility].   Pt is okay with us  returning to his home tomorrow morning to retrieve equipment. Will put phone number of his friend who was staying in his home in [place].

## 2024-07-03 NOTE — Progress Notes (Addendum)
   I personally examined pt's left posterior leg wound. He has woody and hard edema of his left thigh. His left posterior calf wound is oozing serous fluid. No purulence noted. No crepitus noted. I think this patient needs to come back into the hospital for in-house care. Will get CT soft tissue leg to r/o necrotizing fasciitis. Will change IV ABX back to Ancef  2 gram q8h.  Will start some IV dilaudid  for pain. He is on large doses of po methadone  at home so it may take large doses of IV dilaudid  to relieve his pain.       Camellia Door, DO [facility]

## 2024-07-03 NOTE — Progress Notes (Signed)
   07/03/24 2200  BiPAP/CPAP/SIPAP  $ Face Mask Medium Yes  BiPAP/CPAP/SIPAP Pt Type Adult  BiPAP/CPAP/SIPAP Resmed  Mask Type Nasal mask  Dentures removed? Not applicable  Mask Size Medium  Pressure Support 5 cmH20  Patient Home Machine No  Patient Home Mask No  Patient Home Tubing No  Auto Titrate Yes  Minimum cmH2O 13 cmH2O  Maximum cmH2O 6 cmH2O  Nasal massage performed Yes  CPAP/SIPAP surface wiped down Yes  Device Plugged into RED Power Outlet Yes

## 2024-07-03 NOTE — Assessment & Plan Note (Addendum)
 07-03-2024 pt will need to get his Unna boots placed back on his legs at vascular surgery office after he is discharged.  07-04-2024 he will to f/u with vascular surgery as outpatient.  07-05-2024 pt states he has lymphedema pumps at home. I instructed him to use his pump wraps twice a Herren at home to prevent future build up of lymphedema.  07-06-2024 He is to start using his lymphedema wraps/pumps on both his legs a minimum of twice a Gonet.  Change to po lasix  40 mg Qam.

## 2024-07-03 NOTE — Telephone Encounter (Signed)
 Called, no answer, left detailed VM on cell # to call back to schedule an appointment to address wound care. We would like to see him no later than Monday next week.

## 2024-07-03 NOTE — Progress Notes (Signed)
 Outreach to patient SPO2 under 88% no alarm. Unable to reach patient on mobile number. Outreach to identified caregiver. Caregiver reached who confirmed, patient is stable, sleep, wearing CPAP with a good seal, and does not appear in distress. Caregiver confirmed patient laying with arm  with wearable is tucked and may not be picking up readings. Patient will continue to be monitored.   Current Health data by the end of call: RR 20  SPO2 94%  Pulse 72.

## 2024-07-03 NOTE — Progress Notes (Signed)
 Patient arrived at [facility] accompanied by Paramedic. Patient was complaining of severe leg pain. Patient's vital signs were: BP-131/82, P-66, R-20, T-98.1, O2 Stat-94% (on room air). This Clinical research associate observed patient nodding off, patient was reaching in the air for something and talking about french fries. Patient seems slightly lethargic this evening, slow with his speech, patient was still oriented. Patient was complaining about leg pain starting in his thigh all the way down to his toes. At 1940 took vitals again: BP-130/71, P-66, R-20, O2 Stat-96%. Dr. Laurence came to see patient, took his wound dressing off, leg noted to be redder, more swollen and weeping. Dr. Laurence had patient admitted to 5C-10. This Clinical research associate took patient to his room, gave report to the nurse handed over care. --------------------------------------------------------------- Nat SQUIBB. Franchot, EMT

## 2024-07-03 NOTE — Assessment & Plan Note (Addendum)
 07-03-2024 stable. Holding hydrochlorothiazide  while he is on IV lasix .  07-04-2024 holding hydrochlorothiazide  while he is on IV lasix  and po metolazone .  07-05-2024 BP stable.  07-06-2024 continue with lasix  40 mg daily. Stop hydrochlorothiazide .

## 2024-07-03 NOTE — Assessment & Plan Note (Addendum)
 07-03-2024 continue Xarelto   07-04-2024 on Xarelto .  07-05-2024 continue xarelto .  07-06-2024 stay on xarelto  at discharge.

## 2024-07-03 NOTE — Assessment & Plan Note (Addendum)
 07-03-2024 on methadone  135 mg daily. Dosage verified by [facility] admitting provider with his pain clinic.  07-04-2024 continue methadone  135 mg daily. On IV dilaudid  prn and po oxycodone  prn for pain associated with his open wounds and cellulitis.  07-05-2024 continue with methadone  135 mg daily. He states he has been going daily to methadone  clinic to prevent any temptation at home of taking extra doses. Continue with IV dilaudid  prn.  07-06-2024 will provide Rx for methadone  135 mg daily for 3 days(sun, mon, Tuesday). He has appointment for ortho on Tuesday. He can go back to methadone  clinic on Wednesday.

## 2024-07-03 NOTE — Telephone Encounter (Signed)
 Per Dr.Chen at [facility] hospital, refer to our office for wound eval. I will call patient today to have him scheduled to see Dr. Harden.

## 2024-07-03 NOTE — Progress Notes (Signed)
 During the evening, pt reports 10/10 left leg pain. Increased swelling and redness noted to the left leg. Sarah, paramedic at bedside, new photos uploaded to chart. Dr. Laurence made aware and ordered oxycodone  and vascular U/S.   @1855 : Vascular U/S department is closed. Patient waiting at [facility] infusion center with Nicole,EMT in stable condition. Dr. Laurence made aware.  @1950 :Transfer orders placed by Dr. Laurence.  Patient will be transferred to 5C10, Report given at [facility], [place].

## 2024-07-03 NOTE — Progress Notes (Signed)
 Pt seen for routine morning HatH visit. Pt appears well and says he got the best night of sleep he has had in awhile. Pt uses a rollator walker and has an ashtray and pack of cigarettes sitting on same. Pt's home smells very strongly of cigarettes and he does smoke inside. Pt asked if he could smoke a cigarette as we started the visit. I told him I prefer that he didn't and he states he will just smoke next to an open door then. Once pt was done with his cigarette, I was able to obtain vital signs and perform assessment.   Medications administered as noted.  Bandages on wounds on legs unwrapped and photos of wounds uploaded to epic. HatH team notified of same.  Pt had virtual visit with Dr. Laurence and virtual RN.  Wound care performed.   Pt discouraged from smoking to promote wound healing. Pt refuses nicotine  patch as noted.  Pt encouraged to call virtual hub with any problems.

## 2024-07-04 ENCOUNTER — Inpatient Hospital Stay (HOSPITAL_COMMUNITY)

## 2024-07-04 DIAGNOSIS — I10 Essential (primary) hypertension: Secondary | ICD-10-CM | POA: Diagnosis not present

## 2024-07-04 DIAGNOSIS — I5033 Acute on chronic diastolic (congestive) heart failure: Secondary | ICD-10-CM | POA: Diagnosis not present

## 2024-07-04 DIAGNOSIS — I5032 Chronic diastolic (congestive) heart failure: Secondary | ICD-10-CM

## 2024-07-04 DIAGNOSIS — M79606 Pain in leg, unspecified: Secondary | ICD-10-CM

## 2024-07-04 DIAGNOSIS — I890 Lymphedema, not elsewhere classified: Secondary | ICD-10-CM | POA: Diagnosis not present

## 2024-07-04 DIAGNOSIS — L03115 Cellulitis of right lower limb: Secondary | ICD-10-CM | POA: Diagnosis not present

## 2024-07-04 DIAGNOSIS — G8929 Other chronic pain: Secondary | ICD-10-CM | POA: Diagnosis not present

## 2024-07-04 DIAGNOSIS — J449 Chronic obstructive pulmonary disease, unspecified: Secondary | ICD-10-CM | POA: Diagnosis not present

## 2024-07-04 DIAGNOSIS — L03116 Cellulitis of left lower limb: Secondary | ICD-10-CM | POA: Diagnosis not present

## 2024-07-04 DIAGNOSIS — Z7901 Long term (current) use of anticoagulants: Secondary | ICD-10-CM | POA: Diagnosis not present

## 2024-07-04 LAB — CBC WITH DIFFERENTIAL/PLATELET
Abs Immature Granulocytes: 0.04 K/uL (ref 0.00–0.07)
Basophils Absolute: 0.1 K/uL (ref 0.0–0.1)
Basophils Relative: 1 %
Eosinophils Absolute: 0.2 K/uL (ref 0.0–0.5)
Eosinophils Relative: 3 %
HCT: 31.8 % — ABNORMAL LOW (ref 39.0–52.0)
Hemoglobin: 10.9 g/dL — ABNORMAL LOW (ref 13.0–17.0)
Immature Granulocytes: 1 %
Lymphocytes Relative: 34 %
Lymphs Abs: 2.5 K/uL (ref 0.7–4.0)
MCH: 32.8 pg (ref 26.0–34.0)
MCHC: 34.3 g/dL (ref 30.0–36.0)
MCV: 95.8 fL (ref 80.0–100.0)
Monocytes Absolute: 0.5 K/uL (ref 0.1–1.0)
Monocytes Relative: 7 %
Neutro Abs: 4 K/uL (ref 1.7–7.7)
Neutrophils Relative %: 54 %
Platelets: 254 K/uL (ref 150–400)
RBC: 3.32 MIL/uL — ABNORMAL LOW (ref 4.22–5.81)
RDW: 13.9 % (ref 11.5–15.5)
WBC: 7.3 K/uL (ref 4.0–10.5)
nRBC: 0 % (ref 0.0–0.2)

## 2024-07-04 LAB — COMPREHENSIVE METABOLIC PANEL WITH GFR
ALT: 13 U/L (ref 0–44)
AST: 16 U/L (ref 15–41)
Albumin: 2.4 g/dL — ABNORMAL LOW (ref 3.5–5.0)
Alkaline Phosphatase: 60 U/L (ref 38–126)
Anion gap: 8 (ref 5–15)
BUN: 16 mg/dL (ref 8–23)
CO2: 28 mmol/L (ref 22–32)
Calcium: 8.1 mg/dL — ABNORMAL LOW (ref 8.9–10.3)
Chloride: 102 mmol/L (ref 98–111)
Creatinine, Ser: 0.82 mg/dL (ref 0.61–1.24)
GFR, Estimated: >60 mL/min (ref >60)
Glucose, Bld: 81 mg/dL (ref 70–99)
Potassium: 3.7 mmol/L (ref 3.5–5.1)
Sodium: 138 mmol/L (ref 135–145)
Total Bilirubin: 0.4 mg/dL (ref 0.0–1.2)
Total Protein: 5.7 g/dL — ABNORMAL LOW (ref 6.5–8.1)

## 2024-07-04 LAB — C-REACTIVE PROTEIN: CRP: 6.1 mg/dL — ABNORMAL HIGH (ref <1.0)

## 2024-07-04 LAB — SEDIMENTATION RATE: Sed Rate: 53 mm/h — ABNORMAL HIGH (ref 0–16)

## 2024-07-04 LAB — MAGNESIUM: Magnesium: 2 mg/dL (ref 1.7–2.4)

## 2024-07-04 MED ORDER — MAGNESIUM OXIDE -MG SUPPLEMENT 400 (240 MG) MG PO TABS
400.0000 mg | ORAL_TABLET | Freq: Two times a day (BID) | ORAL | Status: DC
  Administered 2024-07-04 – 2024-07-06 (×4): 400 mg via ORAL
  Filled 2024-07-04 (×4): qty 1

## 2024-07-04 MED ORDER — POTASSIUM CHLORIDE CRYS ER 20 MEQ PO TBCR
20.0000 meq | EXTENDED_RELEASE_TABLET | Freq: Two times a day (BID) | ORAL | Status: DC
  Administered 2024-07-04 – 2024-07-06 (×4): 20 meq via ORAL
  Filled 2024-07-04 (×4): qty 1

## 2024-07-04 NOTE — Progress Notes (Signed)
 PROGRESS NOTE    [name] [name]  FMW:[id number] DOB: [birth year] DOA: 07/01/2024 PCP: [name], FNP  Subjective: Pt required transfer back to in-house hospital bed [facility] yesterday due to worsening cellulitis and edema of his legs.  Fortunately, his CT left leg was negative for any abscess or necrotizing fasciitis.  I removed his dressings and placed update pictures in Epic. I then re-dressed both his legs.   Hospital Course: CC: lymphedema, cellulitis, SOB, CP HPI: [name] [name] is a 63 y.o. 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   He has experienced leg discomfort for three to four years, with a recent exacerbation due to infection starting about a month and a half ago. Despite four courses of antibiotics, including Bactrim  and doxycycline , he continues to experience redness, weakness, and ulcerations.   Significant weight gain has been noted over the past six months, increasing from 254 pounds to 281 pounds, despite unchanged eating habits. He attributes this to lymphedema and is currently on a low dose diuretic.   He experiences shortness of breath and chest pain. Recently diagnosed with sleep apnea, he uses a CPAP machine at night, although he is unsure of the exact settings.   His leg wraps were changed on Thursday, and by Sunday, they were soaked through, with the sores burning to the point that he had to remove the wraps.   He had not noticed any increased redness in his legs has been noticed as he is usually covered.   Upon admission into the emergency department patient was noted to be afebrile with stable vital signs.  Labs noted WBC 10.1, hemoglobin 11.1, BUN 18, creatinine 0.91, lactic acid 1->0.5.  Chest x-ray noted  increased interstitial markings in both lung fields with no new consolidation.  Patient had been started on empiric antibiotics of linezolid.  Significant Events: Admitted 07/01/2024 bilateral LE cellulitis 07-02-2024 pt [facility] to [facility] 07-03-2024 due to worsening edema, pain and cellulitis, pt needed to be brought back to hospital for in-house treatment.  Admission Labs: WBC 10.1, HgB 11.1, plt 259 Na 137, K 4.0, CO2 of 23, BUN 18, Scr 0.91, Glu 137 T prot 6.3, Alb 2.7, AST 14, ALT 13, alk phos 69, T. Bili 0.3 Lactic acid 0.5 BNP 28.7 TSH 1.609 ESR 100  Admission Imaging Studies: CXR Increased interstitial marking of both lung fields. No new consolidation.  Significant Labs: 07-02-2024 MRSA screen Negative 07-04-2024 ESR down to 53. From admission level of 100  Significant Imaging Studies: 07-03-2024 CT left LE negative abscess or necrotizing fasciitis 07-04-2024 bilateral LE U/S negative for DVT  Antibiotic Therapy: Anti-infectives (From admission, onward)    Start     Dose/Rate Route Frequency Ordered Stop   07/03/24 0900  cefTRIAXone (ROCEPHIN) 2 g in sodium chloride 0.9 % 100 mL IVPB        2 g 200 mL/hr over 30 Minutes Intravenous Every 24 hours 07/03/24 0705     07/02/24 2200  ceFAZolin (ANCEF) IVPB 2g/100 mL premix  Status:  Discontinued        2 g 200 mL/hr over 30 Minutes Intravenous Every 8 hours 07/02/24 1152 07/02/24 1413   07/01/24 2200  linezolid (ZYVOX) IVPB 600 mg  Status:  Discontinued        60 0 mg 300 mL/hr  over 60 Minutes Intravenous Every 12 hours 07/01/24 1346 07/02/24 1150   07/01/24 1230  linezolid  (ZYVOX ) IVPB 600 mg        600 mg 300 mL/hr over 60 Minutes Intravenous  Once 07/01/24 1218 07/01/24 1345       Procedures:   Consultants:     Assessment and Plan: * Bilateral lower leg cellulitis Date Right Leg Left Leg  04-19-2024       07-01-2024       07-02-2024       07-03-2024             07-03-2024 After returning to hospital       07-04-2024         07-03-2024 Remains on IV lasix  to help with LE edema. Not acute CHF. BNP was normal on admission. Currently on Keng #1 of IV Rocephin . Pt completed 24 hours of Zyvox . I have contacted Dr. Crist office(orthopedics) to see if pt can be seen in the office for clinical trial on wound care.  07-04-2024 pt now on augmented diuresis with IV lasix  40 mg bid and po metolazone  2.5 mg bid. Now on Bozza #2 of IV ancef  2 gram q8. I estimate he has about another 10-20 lbs of fluid retention to diurese. His legs look better with less edema in his thighs today. I think another reason for his worsening cellulitis is due his unsanitary home environment. Pt with very dirty carpets and multiple dogs running around his home. I can only estimate how dirty his wounds become with his dogs running around at the level of his legs where his wounds are.  Continue with IV dilaudid  and po oxycodone  prn for pain control.  Lymphedema 07-03-2024 pt will need to get his Unna boots placed back on his legs at vascular surgery office after he is discharged.  07-04-2024 he will to f/u with vascular surgery as outpatient.  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 gets methadone  from [facility] [phone]. current dose of methadone  135 mg daily. 07-03-2024 continue with chronic daily methadone .  Dosage verified by [facility] admitting provider with his pain clinic.  07-04-2024 continue methadone  135 mg daily. On IV dilaudid  prn and po oxycodone  prn for pain associated with his open wounds and cellulitis.  OSA on CPAP 07-03-2024 stable.  07-04-2024 stable.  (HFpEF) heart failure with preserved ejection fraction (HCC) 07-03-2024 not in acute CHF. IV lasix  for his LE lymphedema.  07-04-2024 continue with IV lasix  for his lymphedema/LE edema. Pt is not in acute CHF.  Chronic anticoagulation - due to prior PE and  DVT 07-03-2024 continue Xarelto   07-04-2024 on Xarelto .  Anxiety and depression 07-03-2024 continue cymbalta   07-04-2024 continue cymbalta .  Obesity, Class II, BMI 35-39.9 07-03-2024 Body mass index is 38.03 kg/m.   Chronic pain 07-03-2024 on methadone  135 mg daily. Dosage verified by [facility] admitting provider with his pain clinic.  07-04-2024 continue methadone  135 mg daily. On IV dilaudid  prn and po oxycodone  prn for pain associated with his open wounds and cellulitis.  Tobacco abuse 07-03-2024 pt still smoking. I asked for him to stop smoking to allow for his legs to heal.  07-04-2024 pt refusing nicotine  patches.  COPD (chronic obstructive pulmonary disease) (HCC) 07-03-2024 stable. On RA.  07-04-2024 stable.  Essential hypertension 07-03-2024 stable. Holding hydrochlorothiazide  while he is on IV lasix .  07-04-2024 holding hydrochlorothiazide  while he is on IV lasix  and po metolazone .  DVT prophylaxis:  rivaroxaban  (XARELTO ) tablet 20 mg Start: 07/02/24 1700 rivaroxaban  (XARELTO ) tablet 20 mg     Code Status: Full Code Family Communication: no family at bedside. He is decisional. Disposition Plan: return home Reason for continuing need for hospitalization: remains on IV Ancef  and IV lasix .  Objective: Vitals:   07/04/24 0130 07/04/24 0547 07/04/24 0806 07/04/24 1321  BP: 135/84 135/77 (!) 146/74 (!) 145/84  Pulse: 70 71 71 (!) 59  Resp: 18 16    Temp: 97.8 F (36.6 C) 97.8 F (36.6 C) (!) 97.4 F (36.3 C) 98.5 F (36.9 C)  TempSrc: Oral Oral Oral Oral  SpO2: 91% 90% 93% 95%  Weight:      Height:        Intake/Output Summary (Last 24 hours) at 07/04/2024 1646 Last data filed at 07/04/2024 0624 Gross per 24 hour  Intake 190.09 ml  Output 300 ml  Net -109.91 ml   Filed Weights   07/01/24 1434 07/02/24 0642 07/02/24 1800  Weight: 127.5 kg 125.3 kg 123.7 kg   Examination:  Physical Exam Vitals and nursing note reviewed.  Constitutional:       General: He is not in acute distress.    Appearance: He is not toxic-appearing.  HENT:     Head: Normocephalic and atraumatic.  Cardiovascular:     Rate and Rhythm: Normal rate and regular rhythm.  Pulmonary:     Effort: Pulmonary effort is normal.     Breath sounds: Normal breath sounds.  Abdominal:     General: Bowel sounds are normal.     Palpations: Abdomen is soft.  Skin:    Capillary Refill: Capillary refill takes less than 2 seconds.     Comments: See pictures of his wounds.  Less edema of bilateral upper thighs Still with +2 pitting edema of both lower legs and feet  Neurological:     Mental Status: He is alert and oriented to person, place, and time.          Data Reviewed: I have personally reviewed following labs and imaging studies  CBC: Recent Labs  Lab 07/01/24 1057 07/02/24 0403 07/03/24 1225 07/04/24 0850  WBC 10.1 10.8* 8.6 7.3  NEUTROABS 6.5  --   --  4.0  HGB 11.1* 11.5* 11.0* 10.9*  HCT 34.9* 35.6* 33.8* 31.8*  MCV 102.9* 99.2 98.3 95.8  PLT 259 264 275 254   Basic Metabolic Panel: Recent Labs  Lab 07/01/24 1057 07/02/24 0403 07/03/24 1224 07/03/24 1225 07/04/24 0850  NA 137 137  --  135 138  K 4.0 4.3  --  3.6 3.7  CL 105 101  --  99 102  CO2 23 28  --  25 28  GLUCOSE 137* 97  --  123* 81  BUN 18 15  --  21 16  CREATININE 0.91 0.86  --  0.94 0.82  CALCIUM  8.6* 8.7*  --  8.0* 8.1*  MG  --   --  1.9  --  2.0   GFR: Estimated Creatinine Clearance: 125.1 mL/min (by C-G formula based on SCr of 0.82 mg/dL). Liver Function Tests: Recent Labs  Lab 07/01/24 1057 07/02/24 0403 07/03/24 1225 07/04/24 0850  AST 14* 14* 17 16  ALT 13 14 14 13   ALKPHOS 69 71 65 60  BILITOT 0.3 0.7 0.2 0.4  PROT 6.3* 6.5 6.3* 5.7*  ALBUMIN 2.7* 2.7* 2.7* 2.4*   BNP (last 3 results) Recent Labs    03/06/24 1510 04/19/24 0832 07/01/24 1057  BNP 109*  46.2 28.7   Sepsis Labs: Recent Labs  Lab 07/01/24 1113 07/01/24 1300  LATICACIDVEN  1.0 0.5    Recent Results (from the past 240 hours)  MRSA Next Gen by PCR, Nasal     Status: None   Collection Time: 07/02/24  1:57 PM   Specimen: Nasal Mucosa; Nasal Swab  Result Value Ref Range Status   MRSA by PCR Next Gen NOT DETECTED NOT DETECTED Final    Comment: (NOTE) The GeneXpert MRSA Assay (FDA approved for NASAL specimens only), is one component of a comprehensive MRSA colonization surveillance program. It is not intended to diagnose MRSA infection nor to guide or monitor treatment for MRSA infections. Test performance is not FDA approved in patients less than 2 years old. Performed at [facility], [address] [place], [place] [address]      Radiology Studies: VAS US  LOWER EXTREMITY VENOUS (DVT) Result Date: 07/04/2024  Lower Venous DVT Study Patient Name:  [name] [name]  Date of Exam:   07/04/2024 Medical Rec #: [id number]   Accession #:    [id number] Date of Birth: 1961/04/26   Patient Gender: M Patient Age:   63 years Exam Location:  [facility] Procedure:      VAS US  LOWER EXTREMITY VENOUS (DVT) Referring Phys: Amaura Authier --------------------------------------------------------------------------------  Indications: Pain.  Limitations: Body habitus, bandages and poor ultrasound/tissue interface. Performing Technologist: Elmarie Lindau, RVT  Examination Guidelines: A complete evaluation includes B-mode imaging, spectral Doppler, color Doppler, and power Doppler as needed of all accessible portions of each vessel. Bilateral testing is considered an integral part of a complete examination. Limited examinations for reoccurring indications may be performed as noted. The reflux portion of the exam is performed with the patient in reverse Trendelenburg.  +---------+---------------+---------+-----------+----------+------------------+ RIGHT    CompressibilityPhasicitySpontaneityPropertiesThrombus Aging      +---------+---------------+---------+-----------+----------+------------------+ CFV      Full           Yes      Yes                                     +---------+---------------+---------+-----------+----------+------------------+ SFJ      Full                                                            +---------+---------------+---------+-----------+----------+------------------+ FV Prox  Full                                                            +---------+---------------+---------+-----------+----------+------------------+ FV Mid                  Yes                                              +---------+---------------+---------+-----------+----------+------------------+ FV Distal  Not well                                                                 visualized         +---------+---------------+---------+-----------+----------+------------------+ PFV      Full                                                            +---------+---------------+---------+-----------+----------+------------------+ POP      Full           Yes      Yes                                     +---------+---------------+---------+-----------+----------+------------------+ PTV                                                   not assessed,                                                            bandage            +---------+---------------+---------+-----------+----------+------------------+ PERO                                                  not assessed,                                                            bandage            +---------+---------------+---------+-----------+----------+------------------+   +---------+---------------+---------+-----------+----------+------------------+ LEFT     CompressibilityPhasicitySpontaneityPropertiesThrombus Aging      +---------+---------------+---------+-----------+----------+------------------+ CFV      Full           Yes      Yes                                     +---------+---------------+---------+-----------+----------+------------------+ SFJ      Full                                                            +---------+---------------+---------+-----------+----------+------------------+ FV Prox  Full                                                            +---------+---------------+---------+-----------+----------+------------------+ FV Mid                  Yes                                              +---------+---------------+---------+-----------+----------+------------------+ FV Distal                                             Not well                                                                 visualized         +---------+---------------+---------+-----------+----------+------------------+ PFV      Full                                                            +---------+---------------+---------+-----------+----------+------------------+ POP      Full           Yes      Yes                                     +---------+---------------+---------+-----------+----------+------------------+ PTV                                                   not assessed,                                                            bandage            +---------+---------------+---------+-----------+----------+------------------+ PERO                                                  not assessed,  bandage            +---------+---------------+---------+-----------+----------+------------------+     Summary: RIGHT: - There is no evidence of deep vein thrombosis in the lower extremity. However, portions of this examination were limited- see technologist comments above.  - No cystic  structure found in the popliteal fossa.  LEFT: - There is no evidence of deep vein thrombosis in the lower extremity. However, portions of this examination were limited- see technologist comments above.  - No cystic structure found in the popliteal fossa.  *See table(s) above for measurements and observations. Electronically signed by Gaile New MD on 07/04/2024 at 10:34:11 AM.    Final    CT FEMUR LEFT W CONTRAST Result Date: 07/03/2024 CLINICAL DATA:  Worsening cellulitis of the left leg to rule out abscess or necrotizing fasciitis. EXAM: CT OF THE LOWER LEFT EXTREMITY WITH CONTRAST: CT left femur, CT left tibia fibula TECHNIQUE: Multidetector CT imaging of the lower left extremity was performed according to the standard protocol following intravenous contrast administration. Separate dedicated CT imaging sets were obtained of the left femur and of the left tibia/fibula. RADIATION DOSE REDUCTION: This exam was performed according to the departmental dose-optimization program which includes automated exposure control, adjustment of the mA and/or kV according to patient size and/or use of iterative reconstruction technique. CONTRAST:  75mL OMNIPAQUE  IOHEXOL  350 MG/ML SOLN COMPARISON:  Ultrasound of the left lower extremity leg veins 07/11/2023. Left knee radiographs 07/11/2023 FINDINGS: Bones/Joint/Cartilage Degenerative changes in the left hip with joint space narrowing and prominent osteophyte formation on both sides of the joint. Mild degenerative changes in the left knee with medial compartment narrowing and small osteophyte formation. The left femur, left tibia, and left fibula appear intact. No focal bone lesion. No cortical destruction or bone sclerosis to suggest osteomyelitis. No significant effusions. Ligaments Suboptimally assessed by CT. Muscles and Tendons No intramuscular mass, hematoma or loculated collections are identified. Fatty atrophy of the calf musculature. Soft tissues Diffuse  circumferential soft tissue stranding and skin thickening extending from the hip to the ankle. This could represent edema or cellulitis. No loculated collections or soft tissue gas identified. Prominent superficial venous vascular structures consistent with varices. The visualized arterial structures in deep veins appear grossly patent. IMPRESSION: 1. No acute bony abnormalities. No CT evidence of osteomyelitis. Degenerative changes in the hip and knee. 2. Diffuse soft tissue stranding and skin thickening throughout the lower extremity from hip to ankle. This could represent edema and/or cellulitis. No loculated abscess collections identified. No soft tissue gas. 3. Fatty atrophy of the calf musculature. Electronically Signed   By: Elsie Gravely M.D.   On: 07/03/2024 21:49   CT TIBIA FIBULA LEFT W CONTRAST Result Date: 07/03/2024 CLINICAL DATA:  Worsening cellulitis of the left leg to rule out abscess or necrotizing fasciitis. EXAM: CT OF THE LOWER LEFT EXTREMITY WITH CONTRAST: CT left femur, CT left tibia fibula TECHNIQUE: Multidetector CT imaging of the lower left extremity was performed according to the standard protocol following intravenous contrast administration. Separate dedicated CT imaging sets were obtained of the left femur and of the left tibia/fibula. RADIATION DOSE REDUCTION: This exam was performed according to the departmental dose-optimization program which includes automated exposure control, adjustment of the mA and/or kV according to patient size and/or use of iterative reconstruction technique. CONTRAST:  75mL OMNIPAQUE  IOHEXOL  350 MG/ML SOLN COMPARISON:  Ultrasound of the left lower extremity leg veins 07/11/2023. Left knee radiographs 07/11/2023 FINDINGS: Bones/Joint/Cartilage Degenerative changes in the left hip with  joint space narrowing and prominent osteophyte formation on both sides of the joint. Mild degenerative changes in the left knee with medial compartment narrowing and small  osteophyte formation. The left femur, left tibia, and left fibula appear intact. No focal bone lesion. No cortical destruction or bone sclerosis to suggest osteomyelitis. No significant effusions. Ligaments Suboptimally assessed by CT. Muscles and Tendons No intramuscular mass, hematoma or loculated collections are identified. Fatty atrophy of the calf musculature. Soft tissues Diffuse circumferential soft tissue stranding and skin thickening extending from the hip to the ankle. This could represent edema or cellulitis. No loculated collections or soft tissue gas identified. Prominent superficial venous vascular structures consistent with varices. The visualized arterial structures in deep veins appear grossly patent. IMPRESSION: 1. No acute bony abnormalities. No CT evidence of osteomyelitis. Degenerative changes in the hip and knee. 2. Diffuse soft tissue stranding and skin thickening throughout the lower extremity from hip to ankle. This could represent edema and/or cellulitis. No loculated abscess collections identified. No soft tissue gas. 3. Fatty atrophy of the calf musculature. Electronically Signed   By: Elsie Gravely M.D.   On: 07/03/2024 21:49    Scheduled Meds:  DULoxetine   30 mg Oral Daily   furosemide   40 mg Intravenous BID   gabapentin   200 mg Oral TID   loratadine   10 mg Oral Daily   magnesium  oxide  400 mg Oral BID   methadone   135 mg Oral Daily   metolazone   2.5 mg Oral BID   nicotine   21 mg Transdermal Daily   potassium chloride   20 mEq Oral BID   rivaroxaban   20 mg Oral Q supper   Continuous Infusions:   ceFAZolin  (ANCEF ) IV 2 g (07/04/24 1530)     LOS: 3 days   Time spent: 60 minutes  Camellia Door, DO  [facility]  07/04/2024, 4:46 PM

## 2024-07-04 NOTE — Plan of Care (Signed)
  Problem: Education: Goal: Knowledge of General Education information will improve Description: Including pain rating scale, medication(s)/side effects and non-pharmacologic comfort measures Outcome: Progressing   Problem: Clinical Measurements: Goal: Respiratory complications will improve Outcome: Progressing   Problem: Activity: Goal: Risk for activity intolerance will decrease Outcome: Progressing   Problem: Nutrition: Goal: Adequate nutrition will be maintained Outcome: Progressing   Problem: Coping: Goal: Level of anxiety will decrease Outcome: Progressing   Problem: Elimination: Goal: Will not experience complications related to urinary retention Outcome: Progressing   Problem: Pain Managment: Goal: General experience of comfort will improve and/or be controlled Outcome: Progressing   Problem: Safety: Goal: Ability to remain free from injury will improve Outcome: Progressing   Problem: Skin Integrity: Goal: Risk for impaired skin integrity will decrease Outcome: Progressing

## 2024-07-04 NOTE — Progress Notes (Signed)
 BLE venous exam is completed. Bronda Alfred, RVT

## 2024-07-05 DIAGNOSIS — E66812 Obesity, class 2: Secondary | ICD-10-CM | POA: Diagnosis not present

## 2024-07-05 DIAGNOSIS — L03115 Cellulitis of right lower limb: Secondary | ICD-10-CM | POA: Diagnosis not present

## 2024-07-05 DIAGNOSIS — G8929 Other chronic pain: Secondary | ICD-10-CM | POA: Diagnosis not present

## 2024-07-05 DIAGNOSIS — I890 Lymphedema, not elsewhere classified: Secondary | ICD-10-CM | POA: Diagnosis not present

## 2024-07-05 DIAGNOSIS — E876 Hypokalemia: Secondary | ICD-10-CM

## 2024-07-05 DIAGNOSIS — I10 Essential (primary) hypertension: Secondary | ICD-10-CM | POA: Diagnosis not present

## 2024-07-05 DIAGNOSIS — Z7901 Long term (current) use of anticoagulants: Secondary | ICD-10-CM | POA: Diagnosis not present

## 2024-07-05 DIAGNOSIS — I5032 Chronic diastolic (congestive) heart failure: Secondary | ICD-10-CM | POA: Diagnosis not present

## 2024-07-05 DIAGNOSIS — L03116 Cellulitis of left lower limb: Secondary | ICD-10-CM | POA: Diagnosis not present

## 2024-07-05 LAB — MAGNESIUM: Magnesium: 2.1 mg/dL (ref 1.7–2.4)

## 2024-07-05 LAB — BASIC METABOLIC PANEL WITH GFR
Anion gap: 12 (ref 5–15)
BUN: 23 mg/dL (ref 8–23)
CO2: 29 mmol/L (ref 22–32)
Calcium: 8.3 mg/dL — ABNORMAL LOW (ref 8.9–10.3)
Chloride: 94 mmol/L — ABNORMAL LOW (ref 98–111)
Creatinine, Ser: 1.18 mg/dL (ref 0.61–1.24)
GFR, Estimated: >60 mL/min (ref >60)
Glucose, Bld: 95 mg/dL (ref 70–99)
Potassium: 3.2 mmol/L — ABNORMAL LOW (ref 3.5–5.1)
Sodium: 135 mmol/L (ref 135–145)

## 2024-07-05 MED ORDER — POTASSIUM CHLORIDE CRYS ER 20 MEQ PO TBCR
40.0000 meq | EXTENDED_RELEASE_TABLET | ORAL | Status: AC
  Administered 2024-07-05 (×2): 40 meq via ORAL
  Filled 2024-07-05 (×2): qty 2

## 2024-07-05 NOTE — Assessment & Plan Note (Addendum)
 07-05-2024 give more po kcl 40 meq q4h x 2 doses. Continue with bid supplements. Due to aggressive diuresis. Repeat BMP in AM.  07-06-2024 resolved.

## 2024-07-05 NOTE — Progress Notes (Signed)
 PROGRESS NOTE    [name] [name]  FMW:[id number] DOB: November 26, 1960 DOA: 07/01/2024 PCP: [name], FNP  Subjective: Pt seen and examined. He is doing very well. Diuresis ongoing. He states he is urinating every 15 min or so.  Weight is down to 260.58 lbs. Admission weight 281 lbs.  Dressing I placed yesterday were removed.  Pt states ACE bandage wraps were placed this AM.  After his ACE bandages were removed and his dressing removed, this is noticeable improvement in the level of edema. Wound beds are also healing nicely.  Wounds were cleaned with saline and peroxide(to help loosen the dead and adherent skin) and re-dressed with sterile bandages. ACE wraps were placed again on bilateral legs.   Hospital Course: CC: lymphedema, cellulitis, SOB, CP HPI: [name] [name] is a 63 y.o. 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   He has experienced leg discomfort for three to four years, with a recent exacerbation due to infection starting about a month and a half ago. Despite four courses of antibiotics, including Bactrim  and doxycycline , he continues to experience redness, weakness, and ulcerations.   Significant weight gain has been noted over the past six months, increasing from 254 pounds to 281 pounds, despite unchanged eating habits. He attributes this to lymphedema and is currently on a low dose diuretic.   He experiences shortness of breath and chest pain. Recently diagnosed with sleep apnea, he uses a CPAP machine at night, although he is unsure of the exact settings.   His leg wraps were changed on Thursday, and by Sunday, they were soaked through, with the sores burning to the point that he had to remove the wraps.   He had not noticed any increased redness in  his legs has been noticed as he is usually covered.   Upon admission into the emergency department patient was noted to be afebrile with stable vital signs.  Labs noted WBC 10.1, hemoglobin 11.1, BUN 18, creatinine 0.91, lactic acid 1->0.5.  Chest x-ray noted increased interstitial markings in both lung fields with no new consolidation.  Patient had been started on empiric antibiotics of linezolid.  Significant Events: Admitted 07/01/2024 bilateral LE cellulitis 07-02-2024 pt admitted to [facility] 07-03-2024 due to worsening edema, pain and cellulitis, pt needed to be brought back to hospital for in-house treatment.  Admission Labs: WBC 10.1, HgB 11.1, plt 259 Na 137, K 4.0, CO2 of 23, BUN 18, Scr 0.91, Glu 137 T prot 6.3, Alb 2.7, AST 14, ALT 13, alk phos 69, T. Bili 0.3 Lactic acid 0.5 BNP 28.7 TSH 1.609 ESR 100  Admission Imaging Studies: CXR Increased interstitial marking of both lung fields. No new consolidation.  Significant Labs: 07-02-2024 MRSA screen Negative 07-04-2024 ESR down to 53. From admission level of 100  Significant Imaging Studies: 07-03-2024 CT left LE negative abscess or necrotizing fasciitis 07-04-2024 bilateral LE U/S negative for DVT  Antibiotic Therapy: Anti-infectives (From admission, onward)    Start     Dose/Rate Route Frequency Ordered Stop   07/03/24 0900  cefTRIAXone (ROCEPHIN) 2 g in sodium chloride 0.9 % 100 mL IVPB        2 g 200 mL/hr over 30 Minutes Intravenous Every 24 hours 07/03/24 0705     08 /26/25 2200  ceFAZolin  (ANCEF ) IVPB 2g/100 mL premix  Status:  Discontinued        2 g 200 mL/hr over 30 Minutes Intravenous Every 8 hours 07/02/24 1152 07/02/24 1413   07/01/24 2200  linezolid  (ZYVOX ) IVPB 600 mg  Status:  Discontinued        600 mg 300 mL/hr over 60 Minutes Intravenous Every 12 hours 07/01/24 1346 07/02/24 1150   07/01/24 1230  linezolid  (ZYVOX ) IVPB 600 mg        600 mg 300 mL/hr over 60 Minutes Intravenous   Once 07/01/24 1218 07/01/24 1345       Procedures:   Consultants:     Assessment and Plan: * Bilateral lower leg cellulitis Date Right Leg Left Leg  04-19-2024       07-01-2024       07-02-2024       07-03-2024            07-03-2024 After returning to hospital       07-04-2024       07-05-2024         07-03-2024 Remains on IV lasix  to help with LE edema. Not acute CHF. BNP was normal on admission. Currently on [place] #1 of IV Rocephin . Pt completed 24 hours of Zyvox . I have contacted Dr. Crist office(orthopedics) to see if pt can be seen in the office for clinical trial on wound care.  07-04-2024 pt now on augmented diuresis with IV lasix  40 mg bid and po metolazone  2.5 mg bid. Now on [place] #2 of IV ancef  2 gram q8. I estimate he has about another 10-20 lbs of fluid retention to diurese. His legs look better with less edema in his thighs today. I think another reason for his worsening cellulitis is due his unsanitary home environment. Pt with very dirty carpets and multiple dogs running around his home. I can only estimate how dirty his wounds become with his dogs running around at the level of his legs where his wounds are.  Continue with IV dilaudid  and po oxycodone  prn for pain control.  07-05-2024 ASO titer pending. Continue with aggressive diuresis with IV lasix  and po metolazone . Pt has lost nearly 20 lbs in water weight gain. Scr is stable. Continue with IV Ancef  2 gram q8h. Pt is anxious to get home so he can pay his bills. Discussed potential of discharge tomorrow if his diuresis continues nicely and his wounds continue to improve. He has ortho appointment for wound care on Tuesday, July 08, 2024.  Lymphedema 07-03-2024 pt will need to get his Unna boots placed back on his legs at vascular surgery office after he is discharged.  07-04-2024 he will to f/u with vascular surgery as outpatient.  07-05-2024 pt states he has lymphedema pumps at home. I  instructed him to use his pump wraps twice a [place] at home to prevent future build up of lymphedema.  Hypokalemia 07-05-2024 give more po kcl 40 meq q4h x 2 doses. Continue with bid supplements. Due to aggressive diuresis. Repeat BMP in AM.  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 gets methadone  from [facility] [phone]. current dose of methadone  135 mg daily. 07-03-2024 continue with chronic daily methadone .  Dosage verified by [facility] admitting provider with his pain clinic.  07-04-2024 continue methadone  135 mg daily. On IV dilaudid  prn and po oxycodone  prn for pain associated with his open wounds and cellulitis.  07-05-2024 continue with methadone  135 mg daily. He states he has been going daily to methadone   clinic to prevent any temptation at home of taking extra doses. Continue with IV dilaudid  prn.   OSA on CPAP 07-03-2024 stable.  07-04-2024 stable.  07-05-2024 stable.  (HFpEF) heart failure with preserved ejection fraction (HCC) 07-03-2024 not in acute CHF. IV lasix  for his LE lymphedema.  07-04-2024 continue with IV lasix  for his lymphedema/LE edema. Pt is not in acute CHF.  07-05-2024 not in acute CHF. On IV lasix  for his lymphedema.  Chronic anticoagulation - due to prior PE and DVT 07-03-2024 continue Xarelto   07-04-2024 on Xarelto .  07-05-2024 continue xarelto .  Anxiety and depression 07-03-2024 continue cymbalta   07-04-2024 continue cymbalta .  07-05-2024 continue cymbalta .  Obesity, Class II, BMI 35-39.9 07-03-2024 Body mass index is 38.03 kg/m.   Chronic pain 07-03-2024 on methadone  135 mg daily. Dosage verified by [facility] admitting provider with his pain clinic.  07-04-2024 continue methadone  135 mg daily. On IV dilaudid  prn and po oxycodone  prn for pain associated with his open wounds and cellulitis.  07-05-2024 continue with methadone  135 mg daily. He states he has been going daily to methadone   clinic to prevent any temptation at home of taking extra doses. Continue with IV dilaudid  prn.  Tobacco abuse 07-03-2024 pt still smoking. I asked for him to stop smoking to allow for his legs to heal.  07-04-2024 pt refusing nicotine  patches.  07-05-2024 stable.  COPD (chronic obstructive pulmonary disease) (HCC) 07-03-2024 stable. On RA.  07-04-2024 stable.  07-05-2024 stable.  Essential hypertension 07-03-2024 stable. Holding hydrochlorothiazide  while he is on IV lasix .  07-04-2024 holding hydrochlorothiazide  while he is on IV lasix  and po metolazone .  07-05-2024 BP stable.  DVT prophylaxis: rivaroxaban  (XARELTO ) tablet 20 mg Start: 07/02/24 1700 rivaroxaban  (XARELTO ) tablet 20 mg     Code Status: Full Code Family Communication: pt is decisional. No family at bedside Disposition Plan: return home Reason for continuing need for hospitalization: continuing IV diuresis, IV Ancef   Objective: Vitals:   07/04/24 1936 07/05/24 0356 07/05/24 0826 07/05/24 1342  BP: 132/77 138/75 117/64 135/83  Pulse: 62 (!) 55 (!) 56 61  Resp: 18 16    Temp: 97.9 F (36.6 C) 98 F (36.7 C) 97.7 F (36.5 C) 98.3 F (36.8 C)  TempSrc: Oral Oral Oral Oral  SpO2: 92% 90% 94% 91%  Weight:  118.2 kg    Height:        Intake/Output Summary (Last 24 hours) at 07/05/2024 1442 Last data filed at 07/04/2024 2000 Gross per 24 hour  Intake 240 ml  Output --  Net 240 ml   Filed Weights   07/02/24 0642 07/02/24 1800 07/05/24 0356  Weight: 125.3 kg 123.7 kg 118.2 kg    Examination:  Physical Exam Vitals and nursing note reviewed.  Constitutional:      Appearance: He is obese.  HENT:     Head: Normocephalic and atraumatic.     Nose: Nose normal.  Pulmonary:     Effort: Pulmonary effort is normal. No respiratory distress.  Abdominal:     General: Bowel sounds are normal.     Palpations: Abdomen is soft.  Skin:    Capillary Refill: Capillary refill takes less than 2 seconds.      Comments: See pictures of his legs.  Wounds are improving. He has wrinkles in his left lower leg due to improving edema.  Neurological:     Mental Status: He is alert and oriented to person, place, and time.  Data Reviewed: I have personally reviewed following labs and imaging studies  CBC: Recent Labs  Lab 07/01/24 1057 07/02/24 0403 07/03/24 1225 07/04/24 0850  WBC 10.1 10.8* 8.6 7.3  NEUTROABS 6.5  --   --  4.0  HGB 11.1* 11.5* 11.0* 10.9*  HCT 34.9* 35.6* 33.8* 31.8*  MCV 102.9* 99.2 98.3 95.8  PLT 259 264 275 254   Basic Metabolic Panel: Recent Labs  Lab 07/01/24 1057 07/02/24 0403 07/03/24 1224 07/03/24 1225 07/04/24 0850 07/05/24 0412  NA 137 137  --  135 138 135  K 4.0 4.3  --  3.6 3.7 3.2*  CL 105 101  --  99 102 94*  CO2 23 28  --  25 28 29   GLUCOSE 137* 97  --  123* 81 95  BUN 18 15  --  21 16 23   CREATININE 0.91 0.86  --  0.94 0.82 1.18  CALCIUM  8.6* 8.7*  --  8.0* 8.1* 8.3*  MG  --   --  1.9  --  2.0 2.1   GFR: Estimated Creatinine Clearance: 84.9 mL/min (by C-G formula based on SCr of 1.18 mg/dL). Liver Function Tests: Recent Labs  Lab 07/01/24 1057 07/02/24 0403 07/03/24 1225 07/04/24 0850  AST 14* 14* 17 16  ALT 13 14 14 13   ALKPHOS 69 71 65 60  BILITOT 0.3 0.7 0.2 0.4  PROT 6.3* 6.5 6.3* 5.7*  ALBUMIN 2.7* 2.7* 2.7* 2.4*   BNP (last 3 results) Recent Labs    03/06/24 1510 04/19/24 0832 07/01/24 1057  BNP 109* 46.2 28.7   Sepsis Labs: Recent Labs  Lab 07/01/24 1113 07/01/24 1300  LATICACIDVEN 1.0 0.5    Recent Results (from the past 240 hours)  MRSA Next Gen by PCR, Nasal     Status: None   Collection Time: 07/02/24  1:57 PM   Specimen: Nasal Mucosa; Nasal Swab  Result Value Ref Range Status   MRSA by PCR Next Gen NOT DETECTED NOT DETECTED Final    Comment: (NOTE) The GeneXpert MRSA Assay (FDA approved for NASAL specimens only), is one component of a comprehensive MRSA colonization surveillance program.  It is not intended to diagnose MRSA infection nor to guide or monitor treatment for MRSA infections. Test performance is not FDA approved in patients less than 22 years old. Performed at [facility], [address], [place], [place] [address]      Radiology Studies: VAS US  LOWER EXTREMITY VENOUS (DVT) Result Date: 07/04/2024  Lower Venous DVT Study Patient Name:  RAJA LISKA  Date of Exam:   07/04/2024 Medical Rec #: [id number]   Accession #:    [id number] Date of Birth: 1960-11-29   Patient Gender: M Patient Age:   14 years Exam Location:  [facility] Procedure:      VAS US  LOWER EXTREMITY VENOUS (DVT) Referring Phys: Letita Prentiss --------------------------------------------------------------------------------  Indications: Pain.  Limitations: Body habitus, bandages and poor ultrasound/tissue interface. Performing Technologist: Elmarie Lindau, RVT  Examination Guidelines: A complete evaluation includes B-mode imaging, spectral Doppler, color Doppler, and power Doppler as needed of all accessible portions of each vessel. Bilateral testing is considered an integral part of a complete examination. Limited examinations for reoccurring indications may be performed as noted. The reflux portion of the exam is performed with the patient in reverse Trendelenburg.  +---------+---------------+---------+-----------+----------+------------------+ RIGHT    CompressibilityPhasicitySpontaneityPropertiesThrombus Aging     +---------+---------------+---------+-----------+----------+------------------+ CFV      Full  Yes      Yes                                     +---------+---------------+---------+-----------+----------+------------------+ SFJ      Full                                                            +---------+---------------+---------+-----------+----------+------------------+ FV Prox  Full                                                             +---------+---------------+---------+-----------+----------+------------------+ FV Mid                  Yes                                              +---------+---------------+---------+-----------+----------+------------------+ FV Distal                                             Not well                                                                 visualized         +---------+---------------+---------+-----------+----------+------------------+ PFV      Full                                                            +---------+---------------+---------+-----------+----------+------------------+ POP      Full           Yes      Yes                                     +---------+---------------+---------+-----------+----------+------------------+ PTV                                                   not assessed,  bandage            +---------+---------------+---------+-----------+----------+------------------+ PERO                                                  not assessed,                                                            bandage            +---------+---------------+---------+-----------+----------+------------------+   +---------+---------------+---------+-----------+----------+------------------+ LEFT     CompressibilityPhasicitySpontaneityPropertiesThrombus Aging     +---------+---------------+---------+-----------+----------+------------------+ CFV      Full           Yes      Yes                                     +---------+---------------+---------+-----------+----------+------------------+ SFJ      Full                                                            +---------+---------------+---------+-----------+----------+------------------+ FV Prox  Full                                                             +---------+---------------+---------+-----------+----------+------------------+ FV Mid                  Yes                                              +---------+---------------+---------+-----------+----------+------------------+ FV Distal                                             Not well                                                                 visualized         +---------+---------------+---------+-----------+----------+------------------+ PFV      Full                                                            +---------+---------------+---------+-----------+----------+------------------+  POP      Full           Yes      Yes                                     +---------+---------------+---------+-----------+----------+------------------+ PTV                                                   not assessed,                                                            bandage            +---------+---------------+---------+-----------+----------+------------------+ PERO                                                  not assessed,                                                            bandage            +---------+---------------+---------+-----------+----------+------------------+     Summary: RIGHT: - There is no evidence of deep vein thrombosis in the lower extremity. However, portions of this examination were limited- see technologist comments above.  - No cystic structure found in the popliteal fossa.  LEFT: - There is no evidence of deep vein thrombosis in the lower extremity. However, portions of this examination were limited- see technologist comments above.  - No cystic structure found in the popliteal fossa.  *See table(s) above for measurements and observations. Electronically signed by Gaile New MD on 07/04/2024 at 10:34:11 AM.    Final    CT FEMUR LEFT W CONTRAST Result Date: 07/03/2024 CLINICAL DATA:  Worsening  cellulitis of the left leg to rule out abscess or necrotizing fasciitis. EXAM: CT OF THE LOWER LEFT EXTREMITY WITH CONTRAST: CT left femur, CT left tibia fibula TECHNIQUE: Multidetector CT imaging of the lower left extremity was performed according to the standard protocol following intravenous contrast administration. Separate dedicated CT imaging sets were obtained of the left femur and of the left tibia/fibula. RADIATION DOSE REDUCTION: This exam was performed according to the departmental dose-optimization program which includes automated exposure control, adjustment of the mA and/or kV according to patient size and/or use of iterative reconstruction technique. CONTRAST:  75mL OMNIPAQUE  IOHEXOL  350 MG/ML SOLN COMPARISON:  Ultrasound of the left lower extremity leg veins 07/11/2023. Left knee radiographs 07/11/2023 FINDINGS: Bones/Joint/Cartilage Degenerative changes in the left hip with joint space narrowing and prominent osteophyte formation on both sides of the joint. Mild degenerative changes in the left knee with medial compartment narrowing and small osteophyte formation. The left femur, left tibia, and left fibula appear intact. No focal bone lesion. No cortical destruction  or bone sclerosis to suggest osteomyelitis. No significant effusions. Ligaments Suboptimally assessed by CT. Muscles and Tendons No intramuscular mass, hematoma or loculated collections are identified. Fatty atrophy of the calf musculature. Soft tissues Diffuse circumferential soft tissue stranding and skin thickening extending from the hip to the ankle. This could represent edema or cellulitis. No loculated collections or soft tissue gas identified. Prominent superficial venous vascular structures consistent with varices. The visualized arterial structures in deep veins appear grossly patent. IMPRESSION: 1. No acute bony abnormalities. No CT evidence of osteomyelitis. Degenerative changes in the hip and knee. 2. Diffuse soft tissue  stranding and skin thickening throughout the lower extremity from hip to ankle. This could represent edema and/or cellulitis. No loculated abscess collections identified. No soft tissue gas. 3. Fatty atrophy of the calf musculature. Electronically Signed   By: Elsie Gravely M.D.   On: 07/03/2024 21:49   CT TIBIA FIBULA LEFT W CONTRAST Result Date: 07/03/2024 CLINICAL DATA:  Worsening cellulitis of the left leg to rule out abscess or necrotizing fasciitis. EXAM: CT OF THE LOWER LEFT EXTREMITY WITH CONTRAST: CT left femur, CT left tibia fibula TECHNIQUE: Multidetector CT imaging of the lower left extremity was performed according to the standard protocol following intravenous contrast administration. Separate dedicated CT imaging sets were obtained of the left femur and of the left tibia/fibula. RADIATION DOSE REDUCTION: This exam was performed according to the departmental dose-optimization program which includes automated exposure control, adjustment of the mA and/or kV according to patient size and/or use of iterative reconstruction technique. CONTRAST:  75mL OMNIPAQUE  IOHEXOL  350 MG/ML SOLN COMPARISON:  Ultrasound of the left lower extremity leg veins 07/11/2023. Left knee radiographs 07/11/2023 FINDINGS: Bones/Joint/Cartilage Degenerative changes in the left hip with joint space narrowing and prominent osteophyte formation on both sides of the joint. Mild degenerative changes in the left knee with medial compartment narrowing and small osteophyte formation. The left femur, left tibia, and left fibula appear intact. No focal bone lesion. No cortical destruction or bone sclerosis to suggest osteomyelitis. No significant effusions. Ligaments Suboptimally assessed by CT. Muscles and Tendons No intramuscular mass, hematoma or loculated collections are identified. Fatty atrophy of the calf musculature. Soft tissues Diffuse circumferential soft tissue stranding and skin thickening extending from the hip to the  ankle. This could represent edema or cellulitis. No loculated collections or soft tissue gas identified. Prominent superficial venous vascular structures consistent with varices. The visualized arterial structures in deep veins appear grossly patent. IMPRESSION: 1. No acute bony abnormalities. No CT evidence of osteomyelitis. Degenerative changes in the hip and knee. 2. Diffuse soft tissue stranding and skin thickening throughout the lower extremity from hip to ankle. This could represent edema and/or cellulitis. No loculated abscess collections identified. No soft tissue gas. 3. Fatty atrophy of the calf musculature. Electronically Signed   By: Elsie Gravely M.D.   On: 07/03/2024 21:49    Scheduled Meds:  DULoxetine   30 mg Oral Daily   furosemide   40 mg Intravenous BID   gabapentin   200 mg Oral TID   loratadine   10 mg Oral Daily   magnesium  oxide  400 mg Oral BID   methadone   135 mg Oral Daily   metolazone   2.5 mg Oral BID   nicotine   21 mg Transdermal Daily   potassium chloride   20 mEq Oral BID   potassium chloride   40 mEq Oral Q4H   rivaroxaban   20 mg Oral Q supper   Continuous Infusions:   ceFAZolin  (ANCEF ) IV 2  g (07/05/24 1342)     LOS: 4 days   Time spent: 60 minutes  Camellia Door, DO  [facility]  07/05/2024, 2:42 PM

## 2024-07-05 NOTE — Plan of Care (Signed)

## 2024-07-06 ENCOUNTER — Other Ambulatory Visit (HOSPITAL_COMMUNITY): Payer: Self-pay

## 2024-07-06 DIAGNOSIS — E66812 Obesity, class 2: Secondary | ICD-10-CM | POA: Diagnosis not present

## 2024-07-06 DIAGNOSIS — L03116 Cellulitis of left lower limb: Secondary | ICD-10-CM | POA: Diagnosis not present

## 2024-07-06 DIAGNOSIS — G8929 Other chronic pain: Secondary | ICD-10-CM | POA: Diagnosis not present

## 2024-07-06 DIAGNOSIS — E876 Hypokalemia: Secondary | ICD-10-CM | POA: Diagnosis not present

## 2024-07-06 DIAGNOSIS — I5032 Chronic diastolic (congestive) heart failure: Secondary | ICD-10-CM | POA: Diagnosis not present

## 2024-07-06 DIAGNOSIS — L03115 Cellulitis of right lower limb: Secondary | ICD-10-CM | POA: Diagnosis not present

## 2024-07-06 DIAGNOSIS — I10 Essential (primary) hypertension: Secondary | ICD-10-CM | POA: Diagnosis not present

## 2024-07-06 DIAGNOSIS — Z7901 Long term (current) use of anticoagulants: Secondary | ICD-10-CM | POA: Diagnosis not present

## 2024-07-06 DIAGNOSIS — I890 Lymphedema, not elsewhere classified: Secondary | ICD-10-CM | POA: Diagnosis not present

## 2024-07-06 LAB — CBC WITH DIFFERENTIAL/PLATELET
Abs Immature Granulocytes: 0.04 K/uL (ref 0.00–0.07)
Basophils Absolute: 0.1 K/uL (ref 0.0–0.1)
Basophils Relative: 1 %
Eosinophils Absolute: 0.2 K/uL (ref 0.0–0.5)
Eosinophils Relative: 3 %
HCT: 37.6 % — ABNORMAL LOW (ref 39.0–52.0)
Hemoglobin: 12.4 g/dL — ABNORMAL LOW (ref 13.0–17.0)
Immature Granulocytes: 1 %
Lymphocytes Relative: 37 %
Lymphs Abs: 3.2 K/uL (ref 0.7–4.0)
MCH: 32.4 pg (ref 26.0–34.0)
MCHC: 33 g/dL (ref 30.0–36.0)
MCV: 98.2 fL (ref 80.0–100.0)
Monocytes Absolute: 0.5 K/uL (ref 0.1–1.0)
Monocytes Relative: 6 %
Neutro Abs: 4.6 K/uL (ref 1.7–7.7)
Neutrophils Relative %: 52 %
Platelets: 290 K/uL (ref 150–400)
RBC: 3.83 MIL/uL — ABNORMAL LOW (ref 4.22–5.81)
RDW: 13.9 % (ref 11.5–15.5)
WBC: 8.6 K/uL (ref 4.0–10.5)
nRBC: 0 % (ref 0.0–0.2)

## 2024-07-06 LAB — BASIC METABOLIC PANEL WITH GFR
Anion gap: 13 (ref 5–15)
BUN: 22 mg/dL (ref 8–23)
CO2: 28 mmol/L (ref 22–32)
Calcium: 9.1 mg/dL (ref 8.9–10.3)
Chloride: 95 mmol/L — ABNORMAL LOW (ref 98–111)
Creatinine, Ser: 0.96 mg/dL (ref 0.61–1.24)
GFR, Estimated: >60 mL/min (ref >60)
Glucose, Bld: 91 mg/dL (ref 70–99)
Potassium: 3.7 mmol/L (ref 3.5–5.1)
Sodium: 136 mmol/L (ref 135–145)

## 2024-07-06 LAB — MAGNESIUM: Magnesium: 2.2 mg/dL (ref 1.7–2.4)

## 2024-07-06 LAB — C-REACTIVE PROTEIN: CRP: 4.1 mg/dL — ABNORMAL HIGH (ref <1.0)

## 2024-07-06 LAB — SEDIMENTATION RATE: Sed Rate: 63 mm/h — ABNORMAL HIGH (ref 0–16)

## 2024-07-06 MED ORDER — CEFADROXIL 500 MG PO CAPS
1000.0000 mg | ORAL_CAPSULE | Freq: Two times a day (BID) | ORAL | 0 refills | Status: AC
  Filled 2024-07-06: qty 28, 7d supply, fill #0

## 2024-07-06 MED ORDER — POTASSIUM CHLORIDE CRYS ER 20 MEQ PO TBCR
20.0000 meq | EXTENDED_RELEASE_TABLET | Freq: Every day | ORAL | 0 refills | Status: DC
  Filled 2024-07-06: qty 30, 30d supply, fill #0

## 2024-07-06 MED ORDER — GABAPENTIN 100 MG PO CAPS
200.0000 mg | ORAL_CAPSULE | Freq: Three times a day (TID) | ORAL | 0 refills | Status: DC
  Filled 2024-07-06: qty 180, 30d supply, fill #0

## 2024-07-06 MED ORDER — FUROSEMIDE 40 MG PO TABS
40.0000 mg | ORAL_TABLET | Freq: Every morning | ORAL | 0 refills | Status: DC
  Filled 2024-07-06: qty 30, 30d supply, fill #0

## 2024-07-06 MED ORDER — METHADONE HCL 10 MG PO TABS
135.0000 mg | ORAL_TABLET | Freq: Every day | ORAL | 0 refills | Status: AC
  Filled 2024-07-06: qty 41, 3d supply, fill #0

## 2024-07-06 MED ORDER — MAGNESIUM OXIDE -MG SUPPLEMENT 400 (240 MG) MG PO TABS
400.0000 mg | ORAL_TABLET | Freq: Every day | ORAL | 0 refills | Status: DC
  Filled 2024-07-06: qty 30, 30d supply, fill #0

## 2024-07-06 MED ORDER — DULOXETINE HCL 30 MG PO CPEP
30.0000 mg | ORAL_CAPSULE | Freq: Every day | ORAL | 0 refills | Status: DC
  Filled 2024-07-06: qty 30, 30d supply, fill #0

## 2024-07-06 NOTE — Progress Notes (Signed)
 DC order noted per MD. DC RN at bedside with patient ready to discharge. Telemonitor off the patient. AVS printed/reviewed with patient. Education provided on wound care instructions in which patient requested supplies for wound care. Patient provided with week supply of wound care supplies, states he has a friend who can help with wound care. Concerned for compliance and risk for readmission, DC RN reached out to Dr. Laurence and CM to determine if [facility] was considered for surveillance and WC management. POC per Dr. Laurence is to submit patient for wound care trial with Dr. Harden during next appt 9/2. Agreeable with patient dc today, patient informed and agreeable. PIV removed. See LDAs for wounds CDI. All belongings accounted for. TOC med delivered to bedside. Patient taken to [facility].

## 2024-07-06 NOTE — Discharge Summary (Addendum)
 [facility] Discharge Summary   Patient name: [name] [name]  Admit date:     07/01/2024  Discharge date: 07/06/2024  Attending Physician: CLAUDENE MAXIMINO LABOR [[id number]]  Discharge Physician: Camellia Door   PCP: [name], FNP  Admitted From: Home Disposition:  Home  Recommendations for Outpatient Follow-up:  Follow up with PCP in 1-2 weeks Follow up with orthopedics with Dr. Harden on July 09, 2024 @ 3:15 pm for wounds on his lower legs  Home Health:No Equipment/Devices: None    Discharge Condition:Stable CODE STATUS:FULL Diet recommendation: Heart Healthy Fluid Restriction: None  Hospital Summary: CC: lymphedema, cellulitis, SOB, CP HPI: [name] [name] is a 63 y.o. 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   He has experienced leg discomfort for three to four years, with a recent exacerbation due to infection starting about a month and a half ago. Despite four courses of antibiotics, including Bactrim  and doxycycline , he continues to experience redness, weakness, and ulcerations.   Significant weight gain has been noted over the past six months, increasing from 254 pounds to 281 pounds, despite unchanged eating habits. He attributes this to lymphedema and is currently on a low dose diuretic.   He experiences shortness of breath and chest pain. Recently diagnosed with sleep apnea, he uses a CPAP machine at night, although he is unsure of the exact settings.   His leg wraps were changed on Thursday, and by Sunday, they were soaked through, with the sores burning to the point that he had to remove the wraps.   He had not noticed any increased redness in his legs has been noticed as he is usually covered.   Upon admission into the  emergency department patient was noted to be afebrile with stable vital signs.  Labs noted WBC 10.1, hemoglobin 11.1, BUN 18, creatinine 0.91, lactic acid 1->0.5.  Chest x-ray noted increased interstitial markings in both lung fields with no new consolidation.  Patient had been started on empiric antibiotics of linezolid.  Significant Events: Admitted 07/01/2024 bilateral LE cellulitis 07-02-2024 pt admitted to [facility] program 07-03-2024 due to worsening edema, pain and cellulitis, pt needed to be brought back to hospital for in-house treatment.  Admission Labs: WBC 10.1, HgB 11.1, plt 259 Na 137, K 4.0, CO2 of 23, BUN 18, Scr 0.91, Glu 137 T prot 6.3, Alb 2.7, AST 14, ALT 13, alk phos 69, T. Bili 0.3 Lactic acid 0.5 BNP 28.7 TSH 1.609 ESR 100  Admission Imaging Studies: CXR Increased interstitial marking of both lung fields. No new consolidation.  Significant Labs: 07-02-2024 MRSA screen Negative 07-04-2024 ESR down to 53. From admission level of 100  Significant Imaging Studies: 07-03-2024 CT left LE negative abscess or necrotizing fasciitis 07-04-2024 bilateral LE U/S negative for DVT  Antibiotic Therapy: Anti-infectives (From admission, onward)    Start     Dose/Rate Route Frequency Ordered Stop   07/03/24 0900  cefTRIAXone (ROCEPHIN) 2 g in sodium chloride 0.9 % 100 mL IVPB        2 g 200 mL/hr over 30 Minutes Intravenous Every 24 hours 07/03/24 0705     08 /26/25 2200  ceFAZolin  (ANCEF ) IVPB 2g/100 mL premix  Status:  Discontinued        2 g 200 mL/hr over 30 Minutes Intravenous Every 8 hours 07/02/24 1152 07/02/24  1413   07/01/24 2200  linezolid  (ZYVOX ) IVPB 600 mg  Status:  Discontinued        600 mg 300 mL/hr over 60 Minutes Intravenous Every 12 hours 07/01/24 1346 07/02/24 1150   07/01/24 1230  linezolid  (ZYVOX ) IVPB 600 mg        600 mg 300 mL/hr over 60 Minutes Intravenous  Once 07/01/24 1218 07/01/24 1345        Procedures:   Consultants:    Hospital Course by Problem: * Bilateral lower leg cellulitis Date Right Leg Left Leg  04-19-2024       07-01-2024       07-02-2024       07-03-2024            07-03-2024 After returning to hospital       07-04-2024       07-05-2024       07-06-2024            07-03-2024 Remains on IV lasix  to help with LE edema. Not acute CHF. BNP was normal on admission. Currently on Carreira #1 of IV Rocephin . Pt completed 24 hours of Zyvox . I have contacted Dr. Crist office(orthopedics) to see if pt can be seen in the office for clinical trial on wound care.  07-04-2024 pt now on augmented diuresis with IV lasix  40 mg bid and po metolazone  2.5 mg bid. Now on Almond #2 of IV ancef  2 gram q8. I estimate he has about another 10-20 lbs of fluid retention to diurese. His legs look better with less edema in his thighs today. I think another reason for his worsening cellulitis is due his unsanitary home environment. Pt with very dirty carpets and multiple dogs running around his home. I can only estimate how dirty his wounds become with his dogs running around at the level of his legs where his wounds are.  Continue with IV dilaudid  and po oxycodone  prn for pain control.  07-05-2024 ASO titer pending. Continue with aggressive diuresis with IV lasix  and po metolazone . Pt has lost nearly 20 lbs in water weight gain. Scr is stable. Continue with IV Ancef  2 gram q8h. Pt is anxious to get home so he can pay his bills. Discussed potential of discharge tomorrow if his diuresis continues nicely and his wounds continue to improve. He has ortho appointment for wound care on Tuesday, July 08, 2024.  07-06-2024 wound healing progressing nicely. He wants to go home. I think that this is reasonable. He has diuresed well over 20 lbs since admission. Wound healing is progressing very nicely. He has appointment with orthopedics for wound care on Tuesday,  July 09, 2024 with Dr. Crist office. Will discharge him on Duricef 1000 mg bid x 7 days and continue him on lasix  40 mg daily. He is to start using his lymphedema wraps/pumps on both his legs a minimum of twice a Anagnos. Continue with dressing changes once a Goodlow. I recommended he wash his wounds with antibacterial soap and warm water to remove the dead skin layers and wound crust. He is to keep his dogs away from his wounds. Stop smoking.  Inflammatory state from his cellulitis is improving. Admit ESR was 100. Now down to 63.  Lymphedema 07-03-2024 pt will need to get his Unna boots placed back on his legs at vascular surgery office after he is discharged.  07-04-2024 he will to f/u with vascular surgery as outpatient.  07-05-2024 pt states he has lymphedema pumps at home. I instructed  him to use his pump wraps twice a Flaum at home to prevent future build up of lymphedema.  07-06-2024 He is to start using his lymphedema wraps/pumps on both his legs a minimum of twice a Woloszyn.  Change to po lasix  40 mg Qam.  Hypokalemia 07-05-2024 give more po kcl 40 meq q4h x 2 doses. Continue with bid supplements. Due to aggressive diuresis. Repeat BMP in AM.  07-06-2024 resolved.  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 gets methadone  from [facility] [phone]. current dose of methadone  135 mg daily. 07-03-2024 continue with chronic daily methadone .  Dosage verified by [facility] admitting provider with his pain clinic.  07-04-2024 continue methadone  135 mg daily. On IV dilaudid  prn and po oxycodone  prn for pain associated with his open wounds and cellulitis.  07-05-2024 continue with methadone  135 mg daily. He states he has been going daily to methadone  clinic to prevent any temptation at home of taking extra doses. Continue with IV dilaudid  prn.   07-06-2024 will provide Rx for methadone  135 mg daily for 3 days(sun, mon, Tuesday). He has appointment for ortho on  Tuesday. He can go back to methadone  clinic on Wednesday.   OSA on CPAP 07-03-2024 stable.  07-04-2024 stable.  07-05-2024 stable.  07-06-2024 stable.  (HFpEF) heart failure with preserved ejection fraction (HCC) 07-03-2024 not in acute CHF. IV lasix  for his LE lymphedema.  07-04-2024 continue with IV lasix  for his lymphedema/LE edema. Pt is not in acute CHF.  07-05-2024 not in acute CHF. On IV lasix  for his lymphedema.  07-06-2024 change to po lasix  40 mg daily at discharge. Stop hydrochlorothiazide .  Chronic anticoagulation - due to prior PE and DVT 07-03-2024 continue Xarelto   07-04-2024 on Xarelto .  07-05-2024 continue xarelto .  07-06-2024 stay on xarelto  at discharge.  Anxiety and depression 07-03-2024 continue cymbalta   07-04-2024 continue cymbalta .  07-05-2024 continue cymbalta .  08-30-205 stay on cymbalta  at discharge.  Obesity, Class II, BMI 35-39.9 07-03-2024 Body mass index is 38.03 kg/m.   Chronic pain 07-03-2024 on methadone  135 mg daily. Dosage verified by [facility] admitting provider with his pain clinic.  07-04-2024 continue methadone  135 mg daily. On IV dilaudid  prn and po oxycodone  prn for pain associated with his open wounds and cellulitis.  07-05-2024 continue with methadone  135 mg daily. He states he has been going daily to methadone  clinic to prevent any temptation at home of taking extra doses. Continue with IV dilaudid  prn.  07-06-2024 will provide Rx for methadone  135 mg daily for 3 days(sun, mon, Tuesday). He has appointment for ortho on Tuesday. He can go back to methadone  clinic on Wednesday.  Tobacco abuse 07-03-2024 pt still smoking. I asked for him to stop smoking to allow for his legs to heal.  07-04-2024 pt refusing nicotine  patches.  07-05-2024 stable.  07-06-2024 pt advised to stop smoking.  COPD (chronic obstructive pulmonary disease) (HCC) 07-03-2024 stable. On RA.  07-04-2024 stable.  07-05-2024  stable.  07-06-2024 stable.  Essential hypertension 07-03-2024 stable. Holding hydrochlorothiazide  while he is on IV lasix .  07-04-2024 holding hydrochlorothiazide  while he is on IV lasix  and po metolazone .  07-05-2024 BP stable.  07-06-2024 continue with lasix  40 mg daily. Stop hydrochlorothiazide .    Discharge Diagnoses:  Principal Problem:   Bilateral lower leg cellulitis Active Problems:   Hypokalemia   Lymphedema   Essential hypertension   COPD (chronic obstructive pulmonary disease) (HCC)   Tobacco abuse   Chronic pain  Obesity, Class II, BMI 35-39.9   Anxiety and depression   Chronic anticoagulation - due to prior PE and DVT   (HFpEF) heart failure with preserved ejection fraction (HCC)   OSA on CPAP   Methadone  use - due to prior history of fentanyl  abuse. gets methadone  from [facility] [phone]. current dose of methadone  135 mg daily.   Discharge Instructions  Discharge Instructions     Ambulatory referral to Orthopedic Surgery   Complete by: As directed    Potential research trial patient for wounds to his lower extremities.   Call MD for:  difficulty breathing, headache or visual disturbances   Complete by: As directed    Call MD for:  extreme fatigue   Complete by: As directed    Call MD for:  hives   Complete by: As directed    Call MD for:  persistant dizziness or light-headedness   Complete by: As directed    Call MD for:  persistant nausea and vomiting   Complete by: As directed    Call MD for:  redness, tenderness, or signs of infection (pain, swelling, redness, odor or green/yellow discharge around incision site)   Complete by: As directed    Call MD for:  severe uncontrolled pain   Complete by: As directed    Call MD for:  temperature >100.4   Complete by: As directed    Diet - low sodium heart healthy   Complete by: As directed    Discharge instructions   Complete by: As directed    1. Follow up with your primary  care provider in 1-2 weeks following discharge from hospital. 2. Follow up with orthopedics regarding your leg wounds on Tuesday, July 09, 2024 @ 3:15 pm   Discharge wound care:   Complete by: As directed    Wound care  Daily      Comments: Dressing procedure/placement/frequency: Cleanse all of the LE wounds with warm water and liquid soap.  Pat dry do NOT rub dry. Apply single layer of xeroform Soila # 294) to open weeping areas,  top with ABD pad. Wrap from base of toes to the knee with kerlix, followed by 4 ACE bandage from base of toes to the knees bilaterally.  Change daily   Increase activity slowly   Complete by: As directed       Allergies as of 07/06/2024       Reactions   Amlodipine  Swelling   BLE peripheral edema         Medication List     STOP taking these medications    cloNIDine  0.1 MG tablet Commonly known as: CATAPRES    hydrochlorothiazide  25 MG tablet Commonly known as: HYDRODIURIL    valsartan  320 MG tablet Commonly known as: DIOVAN        TAKE these medications    albuterol  108 (90 Base) MCG/ACT inhaler Commonly known as: VENTOLIN  HFA Inhale 2 puffs into the lungs every 6 (six) hours as needed for wheezing or shortness of breath.   cefadroxil  500 MG capsule Commonly known as: DURICEF Take 2 capsules (1,000 mg total) by mouth 2 (two) times daily for 7 days.   DULoxetine  30 MG capsule Commonly known as: CYMBALTA  Take 1 capsule (30 mg total) by mouth daily.   furosemide  40 MG tablet Commonly known as: Lasix  Take 1 tablet (40 mg total) by mouth in the morning.   gabapentin  100 MG capsule Commonly known as: NEURONTIN  Take 2 capsules (200 mg total) by mouth  3 (three) times daily. What changed: how much to take   loratadine  10 MG tablet Commonly known as: CLARITIN  Take 1 tablet (10 mg total) by mouth daily.   magnesium  oxide 400 (240 Mg) MG tablet Commonly known as: MAG-OX Take 1 tablet (400 mg total) by mouth daily.   methadone  10  MG/ML solution Commonly known as: DOLOPHINE  Take 135 mg by mouth daily. What changed: Another medication with the same name was added. Make sure you understand how and when to take each.   methadone  10 MG tablet Commonly known as: DOLOPHINE  Take 13.5 tablets (135 mg total) by mouth daily for 3 days. What changed: You were already taking a medication with the same name, and this prescription was added. Make sure you understand how and when to take each.   potassium chloride  SA 20 MEQ tablet Commonly known as: KLOR-CON  M Take 1 tablet (20 mEq total) by mouth daily.   rivaroxaban  20 MG Tabs tablet Commonly known as: XARELTO  Take 1 tablet (20 mg total) by mouth daily with supper.               Discharge Care Instructions  (From admission, onward)           Start     Ordered   07/06/24 0000  Discharge wound care:       Comments: Wound care  Daily      Comments: Dressing procedure/placement/frequency: Cleanse all of the LE wounds with warm water and liquid soap.  Pat dry do NOT rub dry. Apply single layer of xeroform Soila # 294) to open weeping areas,  top with ABD pad. Wrap from base of toes to the knee with kerlix, followed by 4 ACE bandage from base of toes to the knees bilaterally.  Change daily   07/06/24 1348            Follow-up Information     [name], FNP Follow up on 07/10/2024.   Specialty: Family Medicine Why: Hospital follow up appointment arranged for 07/10/2024 at 3:45 pm. Please arrive 15 minutes before your appointment time. Contact information: [address] [place] [address] [phone]                Allergies  Allergen Reactions   Amlodipine  Swelling    BLE peripheral edema     Discharge Exam: Vitals:   07/06/24 0402 07/06/24 0824  BP: 137/82 114/77  Pulse: (!) 57 61  Resp: 16 19  Temp: 98.2 F (36.8 C) 98.6 F (37 C)  SpO2: 91% 93%    Physical Exam Vitals and nursing note reviewed.  Constitutional:       General: He is not in acute distress.    Appearance: He is obese. He is not toxic-appearing.  Eyes:     General: No scleral icterus. Pulmonary:     Effort: Pulmonary effort is normal. No respiratory distress.  Skin:    Capillary Refill: Capillary refill takes less than 2 seconds.     Comments: See pictures of both legs wounds  Neurological:     Mental Status: He is alert and oriented to person, place, and time.            The results of significant diagnostics from this hospitalization (including imaging, microbiology, ancillary and laboratory) are listed below for reference.    Microbiology: Recent Results (from the past 240 hours)  MRSA Next Gen by PCR, Nasal     Status: None   Collection  Time: 07/02/24  1:57 PM   Specimen: Nasal Mucosa; Nasal Swab  Result Value Ref Range Status   MRSA by PCR Next Gen NOT DETECTED NOT DETECTED Final    Comment: (NOTE) The GeneXpert MRSA Assay (FDA approved for NASAL specimens only), is one component of a comprehensive MRSA colonization surveillance program. It is not intended to diagnose MRSA infection nor to guide or monitor treatment for MRSA infections. Test performance is not FDA approved in patients less than 39 years old. Performed at [facility], [address] [place], [place] [address]      Labs: BNP (last 3 results) Recent Labs    03/06/24 1510 04/19/24 0832 07/01/24 1057  BNP 109* 46.2 28.7   Basic Metabolic Panel: Recent Labs  Lab 07/02/24 0403 07/03/24 1224 07/03/24 1225 07/04/24 0850 07/05/24 0412 07/06/24 0407  NA 137  --  135 138 135 136  K 4.3  --  3.6 3.7 3.2* 3.7  CL 101  --  99 102 94* 95*  CO2 28  --  25 28 29 28   GLUCOSE 97  --  123* 81 95 91  BUN 15  --  21 16 23 22   CREATININE 0.86  --  0.94 0.82 1.18 0.96  CALCIUM  8.7*  --  8.0* 8.1* 8.3* 9.1  MG  --  1.9  --  2.0 2.1 2.2   Liver Function Tests: Recent Labs  Lab 07/01/24 1057 07/02/24 0403 07/03/24 1225 07/04/24 0850   AST 14* 14* 17 16  ALT 13 14 14 13   ALKPHOS 69 71 65 60  BILITOT 0.3 0.7 0.2 0.4  PROT 6.3* 6.5 6.3* 5.7*  ALBUMIN 2.7* 2.7* 2.7* 2.4*   CBC: Recent Labs  Lab 07/01/24 1057 07/02/24 0403 07/03/24 1225 07/04/24 0850 07/06/24 0407  WBC 10.1 10.8* 8.6 7.3 8.6  NEUTROABS 6.5  --   --  4.0 4.6  HGB 11.1* 11.5* 11.0* 10.9* 12.4*  HCT 34.9* 35.6* 33.8* 31.8* 37.6*  MCV 102.9* 99.2 98.3 95.8 98.2  PLT 259 264 275 254 290   BNP: Recent Labs  Lab 07/01/24 1057  BNP 28.7   Sepsis Labs Recent Labs  Lab 07/02/24 0403 07/03/24 1225 07/04/24 0850 07/06/24 0407  WBC 10.8* 8.6 7.3 8.6   Inflammatory Markers: Lab Results  Component Value Date/Time   ESRSEDRATE 63 (H) 07/06/2024 04:07 AM   ESRSEDRATE 53 (H) 07/04/2024 08:50 AM   ESRSEDRATE 100 (H) 07/01/2024 06:30 PM   Lab Results  Component Value Date/Time   CRP 4.1 (H) 07/06/2024 04:07 AM   CRP 6.1 (H) 07/04/2024 08:50 AM   Procedures/Studies: VAS US  LOWER EXTREMITY VENOUS (DVT) Result Date: 07/04/2024  Lower Venous DVT Study Patient Name:  [name] [name]  Date of Exam:   07/04/2024 Medical Rec #: [id number]   Accession #:    [id number] Date of Birth: 1961/08/18   Patient Gender: M Patient Age:   4 years Exam Location:  [facility] Procedure:      VAS US  LOWER EXTREMITY VENOUS (DVT) Referring Phys: [name] [name] --------------------------------------------------------------------------------  Indications: Pain.  Limitations: Body habitus, bandages and poor ultrasound/tissue interface. Performing Technologist: Elmarie Lindau, RVT  Examination Guidelines: A complete evaluation includes B-mode imaging, spectral Doppler, color Doppler, and power Doppler as needed of all accessible portions of each vessel. Bilateral testing is considered an integral part of a complete examination. Limited examinations for reoccurring indications may be performed as noted. The reflux portion of the exam is performed with the patient in  reverse  Trendelenburg.  +---------+---------------+---------+-----------+----------+------------------+ RIGHT    CompressibilityPhasicitySpontaneityPropertiesThrombus Aging     +---------+---------------+---------+-----------+----------+------------------+ CFV      Full           Yes      Yes                                     +---------+---------------+---------+-----------+----------+------------------+ SFJ      Full                                                            +---------+---------------+---------+-----------+----------+------------------+ FV Prox  Full                                                            +---------+---------------+---------+-----------+----------+------------------+ FV Mid                  Yes                                              +---------+---------------+---------+-----------+----------+------------------+ FV Distal                                             Not well                                                                 visualized         +---------+---------------+---------+-----------+----------+------------------+ PFV      Full                                                            +---------+---------------+---------+-----------+----------+------------------+ POP      Full           Yes      Yes                                     +---------+---------------+---------+-----------+----------+------------------+ PTV                                                   not assessed,  bandage            +---------+---------------+---------+-----------+----------+------------------+ PERO                                                  not assessed,                                                            bandage            +---------+---------------+---------+-----------+----------+------------------+    +---------+---------------+---------+-----------+----------+------------------+ LEFT     CompressibilityPhasicitySpontaneityPropertiesThrombus Aging     +---------+---------------+---------+-----------+----------+------------------+ CFV      Full           Yes      Yes                                     +---------+---------------+---------+-----------+----------+------------------+ SFJ      Full                                                            +---------+---------------+---------+-----------+----------+------------------+ FV Prox  Full                                                            +---------+---------------+---------+-----------+----------+------------------+ FV Mid                  Yes                                              +---------+---------------+---------+-----------+----------+------------------+ FV Distal                                             Not well                                                                 visualized         +---------+---------------+---------+-----------+----------+------------------+ PFV      Full                                                            +---------+---------------+---------+-----------+----------+------------------+  POP      Full           Yes      Yes                                     +---------+---------------+---------+-----------+----------+------------------+ PTV                                                   not assessed,                                                            bandage            +---------+---------------+---------+-----------+----------+------------------+ PERO                                                  not assessed,                                                            bandage            +---------+---------------+---------+-----------+----------+------------------+     Summary: RIGHT: - There  is no evidence of deep vein thrombosis in the lower extremity. However, portions of this examination were limited- see technologist comments above.  - No cystic structure found in the popliteal fossa.  LEFT: - There is no evidence of deep vein thrombosis in the lower extremity. However, portions of this examination were limited- see technologist comments above.  - No cystic structure found in the popliteal fossa.  *See table(s) above for measurements and observations. Electronically signed by Gaile New MD on 07/04/2024 at 10:34:11 AM.    Final    CT FEMUR LEFT W CONTRAST Result Date: 07/03/2024 CLINICAL DATA:  Worsening cellulitis of the left leg to rule out abscess or necrotizing fasciitis. EXAM: CT OF THE LOWER LEFT EXTREMITY WITH CONTRAST: CT left femur, CT left tibia fibula TECHNIQUE: Multidetector CT imaging of the lower left extremity was performed according to the standard protocol following intravenous contrast administration. Separate dedicated CT imaging sets were obtained of the left femur and of the left tibia/fibula. RADIATION DOSE REDUCTION: This exam was performed according to the departmental dose-optimization program which includes automated exposure control, adjustment of the mA and/or kV according to patient size and/or use of iterative reconstruction technique. CONTRAST:  75mL OMNIPAQUE  IOHEXOL  350 MG/ML SOLN COMPARISON:  Ultrasound of the left lower extremity leg veins 07/11/2023. Left knee radiographs 07/11/2023 FINDINGS: Bones/Joint/Cartilage Degenerative changes in the left hip with joint space narrowing and prominent osteophyte formation on both sides of the joint. Mild degenerative changes in the left knee with medial compartment narrowing and small osteophyte formation. The left femur, left tibia, and left fibula appear intact. No focal bone lesion. No cortical destruction  or bone sclerosis to suggest osteomyelitis. No significant effusions. Ligaments Suboptimally assessed by CT.  Muscles and Tendons No intramuscular mass, hematoma or loculated collections are identified. Fatty atrophy of the calf musculature. Soft tissues Diffuse circumferential soft tissue stranding and skin thickening extending from the hip to the ankle. This could represent edema or cellulitis. No loculated collections or soft tissue gas identified. Prominent superficial venous vascular structures consistent with varices. The visualized arterial structures in deep veins appear grossly patent. IMPRESSION: 1. No acute bony abnormalities. No CT evidence of osteomyelitis. Degenerative changes in the hip and knee. 2. Diffuse soft tissue stranding and skin thickening throughout the lower extremity from hip to ankle. This could represent edema and/or cellulitis. No loculated abscess collections identified. No soft tissue gas. 3. Fatty atrophy of the calf musculature. Electronically Signed   By: Elsie Gravely M.D.   On: 07/03/2024 21:49   CT TIBIA FIBULA LEFT W CONTRAST Result Date: 07/03/2024 CLINICAL DATA:  Worsening cellulitis of the left leg to rule out abscess or necrotizing fasciitis. EXAM: CT OF THE LOWER LEFT EXTREMITY WITH CONTRAST: CT left femur, CT left tibia fibula TECHNIQUE: Multidetector CT imaging of the lower left extremity was performed according to the standard protocol following intravenous contrast administration. Separate dedicated CT imaging sets were obtained of the left femur and of the left tibia/fibula. RADIATION DOSE REDUCTION: This exam was performed according to the departmental dose-optimization program which includes automated exposure control, adjustment of the mA and/or kV according to patient size and/or use of iterative reconstruction technique. CONTRAST:  75mL OMNIPAQUE  IOHEXOL  350 MG/ML SOLN COMPARISON:  Ultrasound of the left lower extremity leg veins 07/11/2023. Left knee radiographs 07/11/2023 FINDINGS: Bones/Joint/Cartilage Degenerative changes in the left hip with joint space  narrowing and prominent osteophyte formation on both sides of the joint. Mild degenerative changes in the left knee with medial compartment narrowing and small osteophyte formation. The left femur, left tibia, and left fibula appear intact. No focal bone lesion. No cortical destruction or bone sclerosis to suggest osteomyelitis. No significant effusions. Ligaments Suboptimally assessed by CT. Muscles and Tendons No intramuscular mass, hematoma or loculated collections are identified. Fatty atrophy of the calf musculature. Soft tissues Diffuse circumferential soft tissue stranding and skin thickening extending from the hip to the ankle. This could represent edema or cellulitis. No loculated collections or soft tissue gas identified. Prominent superficial venous vascular structures consistent with varices. The visualized arterial structures in deep veins appear grossly patent. IMPRESSION: 1. No acute bony abnormalities. No CT evidence of osteomyelitis. Degenerative changes in the hip and knee. 2. Diffuse soft tissue stranding and skin thickening throughout the lower extremity from hip to ankle. This could represent edema and/or cellulitis. No loculated abscess collections identified. No soft tissue gas. 3. Fatty atrophy of the calf musculature. Electronically Signed   By: Elsie Gravely M.D.   On: 07/03/2024 21:49   ECHOCARDIOGRAM COMPLETE Result Date: 07/01/2024    ECHOCARDIOGRAM REPORT   Patient Name:   [name] [name] Date of Exam: 07/01/2024 Medical Rec #:  [id number]  Height:       71.0 in Accession #:    [id number] Weight:       281.1 lb Date of Birth:  1961/07/05  BSA:          2.437 m Patient Age:    63 years   BP:           135/70 mmHg Patient Gender: M  HR:           78 bpm. Exam Location:  Inpatient Procedure: 2D Echo, Color Doppler and Cardiac Doppler (Both Spectral and Color            Flow Doppler were utilized during procedure). Indications:    Congestive Heart Failure I50.9  History:         Patient has prior history of Echocardiogram examinations. CHF,                 COPD; Risk Factors:Hypertension and Dyslipidemia.  Sonographer:    Tinnie Gosling RDCS Referring Phys: [phone] RONDELL A SMITH  Sonographer Comments: Technically difficult study due to poor echo windows and patient is obese. Image acquisition challenging due to patient body habitus and Image acquisition challenging due to COPD. IMPRESSIONS  1. Due to poor windows and porr visualization of the endocardium, EF and wall motion cannot be assessed. Left ventricular endocardial border not optimally defined to evaluate regional wall motion. There is mild concentric left ventricular hypertrophy. Left ventricular diastolic parameters are consistent with Grade I diastolic dysfunction (impaired relaxation).  2. Right ventricular systolic function was not well visualized. The right ventricular size is normal. Tricuspid regurgitation signal is inadequate for assessing PA pressure.  3. The mitral valve is normal in structure. No evidence of mitral valve regurgitation. No evidence of mitral stenosis.  4. The aortic valve is normal in structure. Aortic valve regurgitation is not visualized. No aortic stenosis is present.  5. Recommend repeat limited echo with definity contrast. FINDINGS  Left Ventricle: Due to poor windows and porr visualization of the endocardium, EF and wall motion cannot be assessed. Left ventricular endocardial border not optimally defined to evaluate regional wall motion. The left ventricular internal cavity size was normal in size. There is mild concentric left ventricular hypertrophy. Left ventricular diastolic parameters are consistent with Grade I diastolic dysfunction (impaired relaxation). Normal left ventricular filling pressure. Right Ventricle: The right ventricular size is normal. No increase in right ventricular wall thickness. Right ventricular systolic function was not well visualized. Tricuspid regurgitation signal is  inadequate for assessing PA pressure. Left Atrium: Left atrial size was normal in size. Right Atrium: Right atrial size was normal in size. Pericardium: There is no evidence of pericardial effusion. Mitral Valve: The mitral valve is normal in structure. No evidence of mitral valve regurgitation. No evidence of mitral valve stenosis. Tricuspid Valve: The tricuspid valve is normal in structure. Tricuspid valve regurgitation is not demonstrated. No evidence of tricuspid stenosis. Aortic Valve: The aortic valve is normal in structure. Aortic valve regurgitation is not visualized. No aortic stenosis is present. Pulmonic Valve: The pulmonic valve was not well visualized. Pulmonic valve regurgitation is not visualized. No evidence of pulmonic stenosis. Aorta: The aortic root is normal in size and structure. Venous: The inferior vena cava was not well visualized. IAS/Shunts: No atrial level shunt detected by color flow Doppler.  LEFT VENTRICLE PLAX 2D LVIDd:         5.00 cm   Diastology LVIDs:         3.00 cm   LV e' medial:   10.10 cm/s LV PW:         1.30 cm   LV E/e' medial: 7.1 LV IVS:        1.20 cm LVOT diam:     2.10 cm LVOT Area:     3.46 cm  LEFT ATRIUM         Index LA diam:  4.20 cm 1.72 cm/m   AORTA Ao Root diam: 3.30 cm Ao Asc diam:  3.60 cm MITRAL VALVE MV Area (PHT): 2.96 cm     SHUNTS MV Decel Time: 256 msec     Systemic Diam: 2.10 cm MV E velocity: 71.40 cm/s MV A velocity: 104.00 cm/s MV E/A ratio:  0.69 Wilbert Bihari MD Electronically signed by Wilbert Bihari MD Signature Date/Time: 07/01/2024/4:49:19 PM    Final    DG Chest 2 View Result Date: 07/01/2024 CLINICAL DATA:  Shortness of breath EXAM: CHEST - 2 VIEW COMPARISON:  Chest radiograph April 19, 2024 FINDINGS: The heart size and mediastinal contours are accentuated due to low lung volumes . Left basilar atelectasis. No new consolidation. Increased interstitial markings of lung fields . The visualized skeletal structures are unremarkable.  Degenerative changes of the spine. Status post ACDF. IMPRESSION: Increased interstitial marking of both lung fields. No new consolidation. Electronically Signed   By: Megan  Zare M.D.   On: 07/01/2024 12:29    Time coordinating discharge: 60 mins  SIGNED:  Camellia Door, DO [facility] 07/06/24, 2:20 PM

## 2024-07-06 NOTE — Progress Notes (Signed)
 PROGRESS NOTE    [name] [name]  FMW:[id number] DOB: [birth year] DOA: 07/01/2024 PCP: [name], FNP  Subjective: Pt seen and examined. Pt did not get weighed today. He is ready to go home. He is very happy with his progress.   Hospital Course: CC: lymphedema, cellulitis, SOB, CP HPI: [name] [name] is a 63 y.o. 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   He has experienced leg discomfort for three to four years, with a recent exacerbation due to infection starting about a month and a half ago. Despite four courses of antibiotics, including Bactrim  and doxycycline , he continues to experience redness, weakness, and ulcerations.   Significant weight gain has been noted over the past six months, increasing from 254 pounds to 281 pounds, despite unchanged eating habits. He attributes this to lymphedema and is currently on a low dose diuretic.   He experiences shortness of breath and chest pain. Recently diagnosed with sleep apnea, he uses a CPAP machine at night, although he is unsure of the exact settings.   His leg wraps were changed on Thursday, and by Sunday, they were soaked through, with the sores burning to the point that he had to remove the wraps.   He had not noticed any increased redness in his legs has been noticed as he is usually covered.   Upon admission into the emergency department patient was noted to be afebrile with stable vital signs.  Labs noted WBC 10.1, hemoglobin 11.1, BUN 18, creatinine 0.91, lactic acid 1->0.5.  Chest x-ray noted increased interstitial markings in both lung fields with no new consolidation.  Patient had been started on empiric antibiotics of linezolid.  Significant Events: Admitted 07/01/2024 bilateral LE  cellulitis 07-02-2024 pt admitted to [facility] 07-03-2024 due to worsening edema, pain and cellulitis, pt needed to be brought back to hospital for in-house treatment.  Admission Labs: WBC 10.1, HgB 11.1, plt 259 Na 137, K 4.0, CO2 of 23, BUN 18, Scr 0.91, Glu 137 T prot 6.3, Alb 2.7, AST 14, ALT 13, alk phos 69, T. Bili 0.3 Lactic acid 0.5 BNP 28.7 TSH 1.609 ESR 100  Admission Imaging Studies: CXR Increased interstitial marking of both lung fields. No new consolidation.  Significant Labs: 07-02-2024 MRSA screen Negative 07-04-2024 ESR down to 53. From admission level of 100  Significant Imaging Studies: 07-03-2024 CT left LE negative abscess or necrotizing fasciitis 07-04-2024 bilateral LE U/S negative for DVT  Antibiotic Therapy: Anti-infectives (From admission, onward)    Start     Dose/Rate Route Frequency Ordered Stop   07/03/24 0900  cefTRIAXone (ROCEPHIN) 2 g in sodium chloride 0.9 % 100 mL IVPB        2 g 200 mL/hr over 30 Minutes Intravenous Every 24 hours 07/03/24 0705     07/02/24 2200  ceFAZolin (ANCEF) IVPB 2g/100 mL premix  Status:  Discontinued        2 g 200 mL/hr over 30 Minutes Intravenous Every 8 hours 07/02/24 1152 07/02/24 1413   07/01/24 2200  linezolid (ZYVOX) IVPB 600 mg  Status:  Discontinued        600 mg 300 mL/hr over 60 Minutes Intravenous Every 12 hours 07/01/24 1346 07/02/24 1150   07/01/24 1230  linezolid (ZYVOX) IVPB 600 mg        60 0 mg 300  mL/hr over 60 Minutes Intravenous  Once 07/01/24 1218 07/01/24 1345       Procedures:   Consultants:     Assessment and Plan: * Bilateral lower leg cellulitis Date Right Leg Left Leg  04-19-2024       07-01-2024       07-02-2024       07-03-2024            07-03-2024 After returning to hospital       07-04-2024       07-05-2024       07-06-2024            07-03-2024 Remains on IV lasix  to help with LE edema. Not acute CHF. BNP was  normal on admission. Currently on Micheli #1 of IV Rocephin . Pt completed 24 hours of Zyvox . I have contacted Dr. Crist office(orthopedics) to see if pt can be seen in the office for clinical trial on wound care.  07-04-2024 pt now on augmented diuresis with IV lasix  40 mg bid and po metolazone  2.5 mg bid. Now on Skow #2 of IV ancef  2 gram q8. I estimate he has about another 10-20 lbs of fluid retention to diurese. His legs look better with less edema in his thighs today. I think another reason for his worsening cellulitis is due his unsanitary home environment. Pt with very dirty carpets and multiple dogs running around his home. I can only estimate how dirty his wounds become with his dogs running around at the level of his legs where his wounds are.  Continue with IV dilaudid  and po oxycodone  prn for pain control.  07-05-2024 ASO titer pending. Continue with aggressive diuresis with IV lasix  and po metolazone . Pt has lost nearly 20 lbs in water weight gain. Scr is stable. Continue with IV Ancef  2 gram q8h. Pt is anxious to get home so he can pay his bills. Discussed potential of discharge tomorrow if his diuresis continues nicely and his wounds continue to improve. He has ortho appointment for wound care on Tuesday, July 08, 2024.  07-06-2024 wound healing progressing nicely. He wants to go home. I think that this is reasonable. He has diuresed well over 20 lbs since admission. Wound healing is progressing very nicely. He has appointment with orthopedics for wound care on Tuesday, July 09, 2024 with Dr. Crist office. Will discharge him on Duricef 1000 mg bid x 7 days and continue him on lasix  40 mg daily. He is to start using his lymphedema wraps/pumps on both his legs a minimum of twice a [place]. Continue with dressing changes once a [place]. I recommended he wash his wounds with antibacterial soap and warm water to remove the dead skin layers and wound crust. He is to keep his dogs away from his wounds.  Stop smoking.  Inflammatory state from his cellulitis is improving. Admit ESR was 100. Now down to 63.  Lymphedema 07-03-2024 pt will need to get his Unna boots placed back on his legs at vascular surgery office after he is discharged.  07-04-2024 he will to f/u with vascular surgery as outpatient.  07-05-2024 pt states he has lymphedema pumps at home. I instructed him to use his pump wraps twice a [place] at home to prevent future build up of lymphedema.  07-06-2024 He is to start using his lymphedema wraps/pumps on both his legs a minimum of twice a [place].  Change to po lasix  40 mg Qam.  Hypokalemia 07-05-2024 give more po kcl 40 meq q4h x  2 doses. Continue with bid supplements. Due to aggressive diuresis. Repeat BMP in AM.  07-06-2024 resolved.  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 gets methadone  from [facility] [phone]. current dose of methadone  135 mg daily. 07-03-2024 continue with chronic daily methadone .  Dosage verified by [facility] admitting provider with his pain clinic.  07-04-2024 continue methadone  135 mg daily. On IV dilaudid  prn and po oxycodone  prn for pain associated with his open wounds and cellulitis.  07-05-2024 continue with methadone  135 mg daily. He states he has been going daily to methadone  clinic to prevent any temptation at home of taking extra doses. Continue with IV dilaudid  prn.   07-06-2024 will provide Rx for methadone  135 mg daily for 3 days(sun, mon, Tuesday). He has appointment for ortho on Tuesday. He can go back to methadone  clinic on Wednesday.   OSA on CPAP 07-03-2024 stable.  07-04-2024 stable.  07-05-2024 stable.  07-06-2024 stable.  (HFpEF) heart failure with preserved ejection fraction (HCC) 07-03-2024 not in acute CHF. IV lasix  for his LE lymphedema.  07-04-2024 continue with IV lasix  for his lymphedema/LE edema. Pt is not in acute CHF.  07-05-2024 not in acute CHF. On IV lasix  for his  lymphedema.  07-06-2024 change to po lasix  40 mg daily at discharge. Stop hydrochlorothiazide .  Chronic anticoagulation - due to prior PE and DVT 07-03-2024 continue Xarelto   07-04-2024 on Xarelto .  07-05-2024 continue xarelto .  07-06-2024 stay on xarelto  at discharge.  Anxiety and depression 07-03-2024 continue cymbalta   07-04-2024 continue cymbalta .  07-05-2024 continue cymbalta .  08-30-205 stay on cymbalta  at discharge.  Obesity, Class II, BMI 35-39.9 07-03-2024 Body mass index is 38.03 kg/m.   Chronic pain 07-03-2024 on methadone  135 mg daily. Dosage verified by [facility] admitting provider with his pain clinic.  07-04-2024 continue methadone  135 mg daily. On IV dilaudid  prn and po oxycodone  prn for pain associated with his open wounds and cellulitis.  07-05-2024 continue with methadone  135 mg daily. He states he has been going daily to methadone  clinic to prevent any temptation at home of taking extra doses. Continue with IV dilaudid  prn.  07-06-2024 will provide Rx for methadone  135 mg daily for 3 days(sun, mon, Tuesday). He has appointment for ortho on Tuesday. He can go back to methadone  clinic on Wednesday.  Tobacco abuse 07-03-2024 pt still smoking. I asked for him to stop smoking to allow for his legs to heal.  07-04-2024 pt refusing nicotine  patches.  07-05-2024 stable.  07-06-2024 pt advised to stop smoking.  COPD (chronic obstructive pulmonary disease) (HCC) 07-03-2024 stable. On RA.  07-04-2024 stable.  07-05-2024 stable.  07-06-2024 stable.  Essential hypertension 07-03-2024 stable. Holding hydrochlorothiazide  while he is on IV lasix .  07-04-2024 holding hydrochlorothiazide  while he is on IV lasix  and po metolazone .  07-05-2024 BP stable.  07-06-2024 continue with lasix  40 mg daily. Stop hydrochlorothiazide .   DVT prophylaxis: rivaroxaban  (XARELTO ) tablet 20 mg Start: 07/02/24 1700 rivaroxaban  (XARELTO ) tablet 20 mg     Code  Status: Full Code Family Communication: no family at bedside. He is decisional. Disposition Plan: return home Reason for continuing need for hospitalization: stable for DC.  Objective: Vitals:   07/05/24 1342 07/05/24 1943 07/06/24 0402 07/06/24 0824  BP: 135/83 (!) 152/91 137/82 114/77  Pulse: 61 (!) 57 (!) 57 61  Resp:  16 16 19   Temp: 98.3 F (36.8 C) 98.1 F (36.7 C) 98.2 F (36.8 C) 98.6 F (37 C)  TempSrc: Oral Oral Oral Oral  SpO2: 91% 98% 91% 93%  Weight:      Height:        Intake/Output Summary (Last 24 hours) at 07/06/2024 1337 Last data filed at 07/05/2024 1500 Gross per 24 hour  Intake 280 ml  Output --  Net 280 ml   Filed Weights   07/02/24 0642 07/02/24 1800 07/05/24 0356  Weight: 125.3 kg 123.7 kg 118.2 kg    Examination:  Physical Exam Vitals and nursing note reviewed.  Constitutional:      General: He is not in acute distress.    Appearance: He is obese. He is not toxic-appearing.  Eyes:     General: No scleral icterus. Pulmonary:     Effort: Pulmonary effort is normal. No respiratory distress.  Skin:    Capillary Refill: Capillary refill takes less than 2 seconds.     Comments: See pictures of both legs wounds  Neurological:     Mental Status: He is alert and oriented to person, place, and time.            Data Reviewed: I have personally reviewed following labs and imaging studies  CBC: Recent Labs  Lab 07/01/24 1057 07/02/24 0403 07/03/24 1225 07/04/24 0850 07/06/24 0407  WBC 10.1 10.8* 8.6 7.3 8.6  NEUTROABS 6.5  --   --  4.0 4.6  HGB 11.1* 11.5* 11.0* 10.9* 12.4*  HCT 34.9* 35.6* 33.8* 31.8* 37.6*  MCV 102.9* 99.2 98.3 95.8 98.2  PLT 259 264 275 254 290   Basic Metabolic Panel: Recent Labs  Lab 07/02/24 0403 07/03/24 1224 07/03/24 1225 07/04/24 0850 07/05/24 0412 07/06/24 0407  NA 137  --  135 138 135 136  K 4.3  --  3.6 3.7 3.2* 3.7  CL 101  --  99 102 94* 95*  CO2 28  --  25 28 29 28   GLUCOSE 97  --  123*  81 95 91  BUN 15  --  21 16 23 22   CREATININE 0.86  --  0.94 0.82 1.18 0.96  CALCIUM  8.7*  --  8.0* 8.1* 8.3* 9.1  MG  --  1.9  --  2.0 2.1 2.2   GFR: Estimated Creatinine Clearance: 104.4 mL/min (by C-G formula based on SCr of 0.96 mg/dL). Liver Function Tests: Recent Labs  Lab 07/01/24 1057 07/02/24 0403 07/03/24 1225 07/04/24 0850  AST 14* 14* 17 16  ALT 13 14 14 13   ALKPHOS 69 71 65 60  BILITOT 0.3 0.7 0.2 0.4  PROT 6.3* 6.5 6.3* 5.7*  ALBUMIN 2.7* 2.7* 2.7* 2.4*   BNP (last 3 results) Recent Labs    03/06/24 1510 04/19/24 0832 07/01/24 1057  BNP 109* 46.2 28.7   Sepsis Labs: Recent Labs  Lab 07/01/24 1113 07/01/24 1300  LATICACIDVEN 1.0 0.5    Recent Results (from the past 240 hours)  MRSA Next Gen by PCR, Nasal     Status: None   Collection Time: 07/02/24  1:57 PM   Specimen: Nasal Mucosa; Nasal Swab  Result Value Ref Range Status   MRSA by PCR Next Gen NOT DETECTED NOT DETECTED Final    Comment: (NOTE) The GeneXpert MRSA Assay (FDA approved for NASAL specimens only), is one component of a comprehensive MRSA colonization surveillance program. It is not intended to diagnose MRSA infection nor to guide or monitor treatment for MRSA infections. Test performance is not FDA approved in patients less than 32 years old. Performed at [facility], [address] [place], [place] [address]      Radiology Studies: No results found.  Scheduled Meds:  DULoxetine   30 mg Oral Daily   furosemide   40 mg Intravenous BID   gabapentin   200 mg Oral TID   loratadine   10 mg Oral Daily   magnesium  oxide  400 mg Oral BID   methadone   135 mg Oral Daily   metolazone   2.5 mg Oral BID   nicotine   21 mg Transdermal Daily   potassium chloride   20 mEq Oral BID   rivaroxaban   20 mg Oral Q supper   Continuous Infusions:   ceFAZolin  (ANCEF ) IV 2 g (07/06/24 1200)     LOS: 5 days   Time spent: 60 minutes  Camellia Door, DO  [facility]  07/06/2024, 1:37  PM

## 2024-07-07 LAB — ANTISTREPTOLYSIN O TITER: ASO: 20 [IU]/mL (ref 0.0–200.0)

## 2024-07-08 DIAGNOSIS — G4733 Obstructive sleep apnea (adult) (pediatric): Secondary | ICD-10-CM | POA: Diagnosis not present

## 2024-07-09 ENCOUNTER — Telehealth: Payer: Self-pay

## 2024-07-09 ENCOUNTER — Telehealth: Payer: Self-pay | Admitting: Orthopedic Surgery

## 2024-07-09 ENCOUNTER — Telehealth: Payer: Self-pay | Admitting: *Deleted

## 2024-07-09 ENCOUNTER — Ambulatory Visit: Admitting: Orthopedic Surgery

## 2024-07-09 DIAGNOSIS — Z7689 Persons encountering health services in other specified circumstances: Secondary | ICD-10-CM | POA: Diagnosis not present

## 2024-07-09 NOTE — Telephone Encounter (Signed)
 Patient called. Would like to know if he could use the calamine and zinc gauze for his wound?

## 2024-07-09 NOTE — Transitions of Care (Post Inpatient/ED Visit) (Signed)
 07/09/2024  Name: [name] [name] MRN: [id number] DOB: [birth year]  Today's TOC FU Call Status: Today's TOC FU Call Status:: Successful TOC FU Call Completed TOC FU Call Complete Date: 07/09/24 Patient's Name and Date of Birth confirmed.  Transition Care Management Follow-up Telephone Call Date of Discharge: 07/06/24 Discharge Facility: [facility]) Type of Discharge: Inpatient Admission Primary Inpatient Discharge Diagnosis:: cellulitis How have you been since you were released from the hospital?: Better (appetite good, no issues with bowel/ bladder, ambulates with walker and cane) Any questions or concerns?: No  Items Reviewed: Did you receive and understand the discharge instructions provided?: Yes Medications obtained,verified, and reconciled?: Yes (Medications Reviewed) Any new allergies since your discharge?: No Dietary orders reviewed?: No Do you have support at home?: Yes People in Home [RPT]: alone Name of Support/Comfort Primary Source: friend is temporarily staying with pt Pt agreed to enrollment in [facility] program  Medications Reviewed Today: Medications Reviewed Today     Reviewed by Aura Mliss LABOR, RN (Registered Nurse) on 07/09/24 at 1111  Med List Status: <None>   Medication Order Taking? Sig Documenting Provider Last Dose Status Informant  albuterol  (VENTOLIN  HFA) 108 (90 Base) MCG/ACT inhaler [id number] Yes Inhale 2 puffs into the lungs every 6 (six) hours as needed for wheezing or shortness of breath. [name], FNP  Active Self, Pharmacy Records  cefadroxil  (DURICEF) 500 MG capsule [id number] Yes Take 2 capsules (1,000 mg total) by mouth 2 (two) times daily for 7 days. Laurence Locus, DO  Active   DULoxetine  (CYMBALTA ) 30 MG capsule [id number] Yes Take 1 capsule (30 mg total) by mouth daily. Laurence Locus, DO  Active   furosemide  (LASIX ) 40 MG tablet [id number] Yes Take 1 tablet (40 mg total) by mouth in the morning. Laurence Locus, DO  Active   gabapentin   (NEURONTIN ) 100 MG capsule [id number] Yes Take 2 capsules (200 mg total) by mouth 3 (three) times daily. Laurence Locus, DO  Active   loratadine  (CLARITIN ) 10 MG tablet [id number] Yes Take 1 tablet (10 mg total) by mouth daily. [name], FNP  Active Self, Pharmacy Records  magnesium  oxide (MAG-OX) 400 (240 Mg) MG tablet [id number] Yes Take 1 tablet (400 mg total) by mouth daily. Laurence Locus, DO  Active   methadone  (DOLOPHINE ) 10 MG tablet [id number]  Take 13.5 tablets (135 mg total) by mouth daily for 3 days.  Patient not taking: Reported on 07/09/2024   Laurence Locus, DO  Active   methadone  (DOLOPHINE ) 10 MG/ML solution [id number] Yes Take 135 mg by mouth daily. [provider]  Active Self, Pharmacy Records           Med Note [name] Pablo Jul 01, 2024 12:48 PM) Verified dosage at [facility] with Veandra -- 135mg  daily  potassium chloride  SA (KLOR-CON  M) 20 MEQ tablet [id number] Yes Take 1 tablet (20 mEq total) by mouth daily. Laurence Locus, DO  Active   rivaroxaban  (XARELTO ) 20 MG TABS tablet [id number] Yes Take 1 tablet (20 mg total) by mouth daily with supper. [name], FNP  Active Self, Pharmacy Records  Med List Note [name], CPhT 07/01/24 1248): New Season (Methadone  Clinic) Last dose - 07/01/24 135mg             Home Care and Equipment/Supplies: Were Home Health Services Ordered?: No Any new equipment or medical supplies ordered?: No  Functional Questionnaire: Do you need assistance with bathing/showering or dressing?: No Do you need  assistance with meal preparation?: No Do you need assistance with eating?: No Do you have difficulty maintaining continence: No Do you need assistance with getting out of bed/getting out of a chair/moving?: Yes (cane, walker) Do you have difficulty managing or taking your medications?: No  Follow up appointments reviewed: PCP Follow-up appointment confirmed?: Yes Date of PCP follow-up appointment?:  07/10/24 Follow-up Provider: Jeoffrey Barrio FNP Specialist [facility] Follow-up appointment confirmed?: Yes Date of Specialist follow-up appointment?: 07/09/24 Follow-Up Specialty Provider:: Dr. Harden  @ 315 pm Do you need transportation to your follow-up appointment?: No (uses medicaid transportation) Do you understand care options if your condition(s) worsen?: Yes-patient verbalized understanding  SDOH Interventions Today    Flowsheet Row Most Recent Value  SDOH Interventions   Food Insecurity Interventions Intervention Not Indicated  Housing Interventions Intervention Not Indicated  Transportation Interventions Intervention Not Indicated  Utilities Interventions Intervention Not Indicated    Goals Addressed             This Visit's Progress    VBCI Transitions of Care (TOC) Care Plan       Problems:  Recent Hospitalization for treatment of cellulitis LE bil Knowledge Deficit Related to cellulitis management Pt has friend temporarily staying with help and assisting with wound care, pt to see Dr. Harden today 9/2, primary care provider 9/3, pt states swelling and redness have resolved  Goal:  Over the next 30 days, the patient will not experience hospital readmission  Interventions: Cellulitis Transitions of Care: Doctor Visits  - discussed the importance of doctor visits Post-op wound/incision care reviewed with patient/caregiver Reviewed Signs and symptoms of infection Evaluation of current treatment plan related to cellultis, self-management and patient's adherence to plan as established by provider. Discussed plans with patient for ongoing care management follow up and provided patient with direct contact information for care management team Evaluation of current treatment plan related to cellulitis and patient's adherence to plan as established by provider Reviewed medications with patient and discussed importance of taking as prescribed Discussed plans with patient for  ongoing care management follow up and provided patient with direct contact information for care management team Screening for signs and symptoms of depression related to chronic disease state  Assessed social determinant of health barriers Reviewed wound care instructions, verified pt does have assistance with wound care Pain assessment completed  Patient Self Care Activities:  Attend all scheduled provider appointments Attend church or other social activities Call pharmacy for medication refills 3-7 days in advance of running out of medications Call provider office for new concerns or questions  Notify RN Care Manager of TOC call rescheduling needs Participate in Transition of Care Program/Attend TOC scheduled calls Take medications as prescribed   Report any signs/ symptoms of infection immediately Take antibiotic as prescribed  Plan:  Telephone follow up appointment with care management team member scheduled for:  07/16/24 @ 115 pm The patient has been provided with contact information for the care management team and has been advised to call with any health related questions or concerns.         Mliss Creed [facility], BSN RN Care Manager/ Transition of Care [place]/ [facility] [phone]

## 2024-07-09 NOTE — Telephone Encounter (Signed)
 TRANSITIONS OF CARE FOLLOW UP CALL:  07/01/24-07/06/24 [place] Bilateral Lower Leg Cellulitis  Pt states he is doing much better, pain is better, leg wounds are better and BP is better.   Pt has had a friend staying with him to help with ADL's and bandage changes.   Pt is seeing Dr. Harden today at 3:15 for follow up on leg wounds.  F/U in our office is scheduled for 07/10/2024.  Mjp,lpn

## 2024-07-10 ENCOUNTER — Inpatient Hospital Stay: Admitting: Family Medicine

## 2024-07-10 DIAGNOSIS — Z7689 Persons encountering health services in other specified circumstances: Secondary | ICD-10-CM | POA: Diagnosis not present

## 2024-07-10 NOTE — Telephone Encounter (Signed)
 Pt had an appt yesterday and cancelled and moved it to Thursday. Called and lm on vm to advise lets see him in the office tomorrow and Dr. Harden can evaluate the wound and give instructions on care.

## 2024-07-11 ENCOUNTER — Encounter: Payer: Self-pay | Admitting: Physician Assistant

## 2024-07-11 ENCOUNTER — Ambulatory Visit (INDEPENDENT_AMBULATORY_CARE_PROVIDER_SITE_OTHER): Admitting: Physician Assistant

## 2024-07-11 ENCOUNTER — Inpatient Hospital Stay: Admitting: Family Medicine

## 2024-07-11 DIAGNOSIS — I872 Venous insufficiency (chronic) (peripheral): Secondary | ICD-10-CM

## 2024-07-11 DIAGNOSIS — I890 Lymphedema, not elsewhere classified: Secondary | ICD-10-CM | POA: Diagnosis not present

## 2024-07-11 DIAGNOSIS — Z7689 Persons encountering health services in other specified circumstances: Secondary | ICD-10-CM | POA: Diagnosis not present

## 2024-07-11 NOTE — Progress Notes (Signed)
 Office Visit Note   Patient: [name] [name]           Date of Birth: [birth year]           MRN: [id number] Visit Date: 07/11/2024              Requested by: [name], FNP [address] [place],  [place] [address] PCP: [name], FNP  Chief Complaint  Patient presents with   Right Leg - Wound Check   Left Leg - Wound Check      HPI: 63 y/o male who has chronic edema and with chronic non healing wounds.  The DVT study was negative.  The patient has had history of DVT left LE popliteal partially thrombosed, right popliteal, PT, peroneal venous thrombus and PE in [address], which is currently managed on Xarelto  negative history of varicose vein, positive history of venous stasis ulcers, negative history of  Lymphedema and positive history of skin changes in lower legs.   He has a reflux study in 08/06/2021  reveals Deep venous reflux in the common femoral and popliteal veins. The SFJ and mid thigh have reflux with GSV size is > 0.4to the distal popliteal.  He has been treated with una boots in the past for venous wounds.  Compression and elevation in the past.    He has known Lymphedema that is managed with low dose lasix  and has gotten his pumps prior to being hospitalized with chronic cellulitis of B LE.  He has been treated on/off with multiple una boots followed by [facility] in [place], [place].  He has experienced leg discomfort for three to four years, with a recent exacerbation due to infection starting about a month and a half ago. Despite four courses of antibiotics, including Bactrim  and doxycycline , he continues to experience redness, weakness, and ulcerations.   He was just released from the hospital on 07/06/24 and is here to establish care for lymphedema and chronic venous insufficiency.      Assessment & Plan: Visit Diagnoses: No diagnosis found.  Plan: He has no open wounds currently.  He does have clear fluid weeping from the left > right LE.  He was  given an elevation handout and measured for Vive socks.  He fits into 2XL size.  He was given a Tour manager and ask to get compression knee highs.  Compression should be worn daily and can change and wash after showers.  He was discharged with Vashe solution from the hospital that he [name] use to clean his legs with after the shower before re donning his compression.    Follow-Up Instructions: Return in about 3 weeks (around 08/01/2024).   Ortho Exam  Patient is alert, oriented, no adenopathy, well-dressed, normal affect, normal respiratory effort.Left LE edema > right LE edema.  Clear weeping.  No open wounds.  The left calf size measures 39 cm in circumference.  He has doppler flow in DP/PT/Peroneal arteries.  No scare from foot wounds.  B LE pitting edema.  Positive dermatitis and scaly skin.      Imaging: No results found. No images are attached to the encounter.  Labs: Lab Results  Component Value Date   HGBA1C 5.5 03/06/2024   HGBA1C 5.4 10/05/2022   HGBA1C 5.7 (H) 09/19/2015   ESRSEDRATE 63 (H) 07/06/2024   ESRSEDRATE 53 (H) 07/04/2024   ESRSEDRATE 100 (H) 07/01/2024   CRP 4.1 (H) 07/06/2024   CRP 6.1 (H) 07/04/2024  REPTSTATUS 02/16/2014 FINAL 02/14/2014   CULT NO GROWTH Performed at [facility] 02/14/2014   LABORGA NO GROWTH 07/10/2013     Lab Results  Component Value Date   ALBUMIN 2.4 (L) 07/04/2024   ALBUMIN 2.7 (L) 07/03/2024   ALBUMIN 2.7 (L) 07/02/2024    Lab Results  Component Value Date   MG 2.2 07/06/2024   MG 2.1 07/05/2024   MG 2.0 07/04/2024   No results found for: VD25OH  No results found for: PREALBUMIN    Latest Ref Rng & Units 07/06/2024    4:07 AM 07/04/2024    8:50 AM 07/03/2024   12:25 PM  CBC EXTENDED  WBC 4.0 - 10.5 K/uL 8.6  7.3  8.6   RBC 4.22 - 5.81 MIL/uL 3.83  3.32  3.44   Hemoglobin 13.0 - 17.0 g/dL 87.5  89.0  88.9   HCT 39.0 - 52.0 % 37.6  31.8  33.8   Platelets 150 - 400 K/uL 290  254  275   NEUT# 1.7 - 7.7  K/uL 4.6  4.0    Lymph# 0.7 - 4.0 K/uL 3.2  2.5       There is no height or weight on file to calculate BMI.  Orders:  No orders of the defined types were placed in this encounter.  No orders of the defined types were placed in this encounter.    Procedures: No procedures performed  Clinical Data: No additional findings.  ROS:  All other systems negative, except as noted in the HPI. Review of Systems  Objective: Vital Signs: There were no vitals taken for this visit.  Specialty Comments:  No specialty comments available.  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 gets methadone  from [facility] [phone]. current dose of methadone  135 mg daily. 07/03/2024   Bilateral lower leg cellulitis 07/01/2024   Lymphedema 07/01/2024   (HFpEF) heart failure with preserved ejection fraction (HCC) 07/01/2024   OSA on CPAP 07/01/2024   Excessive daytime sleepiness 05/08/2024   Facial skin lesion 03/06/2024   Bilateral lower extremity edema 03/06/2024   Vision loss 03/06/2024   Wound of left leg 10/05/2022   DOE (dyspnea on exertion) 07/01/2021   Edema 07/01/2021   Sinus bradycardia 07/01/2021   Chronic anticoagulation - due to prior PE and DVT 07/23/2018   Generalized anxiety disorder 07/19/2016   Nicotine  dependence, cigarettes, uncomplicated 07/19/2016   Tobacco use disorder    Fall    Aphasia due to recent stroke    Right sided weakness 09/18/2015   Right knee pain 07/21/2015   Wound of skin 06/03/2015   Chronic pain syndrome 05/28/2014   Anxiety state 05/28/2014   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   Anxiety and depression 03/24/2014   Low back pain 02/16/2014   Left foot drop 02/14/2014   Hypokalemia 02/14/2014   Obesity, Class II, BMI 35-39.9 01/15/2013   Chronic pain 02/18/2012   Essential hypertension 11/15/2011   Hyperlipidemia 11/15/2011   COPD  (chronic obstructive pulmonary disease) (HCC) 11/15/2011   Tobacco abuse 11/15/2011   Past Medical History:  Diagnosis Date   Allergy    Anxiety    Asthma    Cancer (HCC)    CHF (congestive heart failure) (HCC)    Constipation due to pain medication    COPD (chronic obstructive pulmonary disease) (HCC)    CVA (cerebral infarction) 09/18/2015  Deep venous thrombosis (DVT) of left peroneal vein (HCC) 09/12/2023   Depression    Emphysema of lung (HCC)    History of DVT (deep vein thrombosis) 07/01/2024   History of pulmonary embolism 07/19/2016   Hyperlipidemia    Hypertension    Migraine    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   onset 02/14/14   Opioid abuse (HCC)    PE (pulmonary embolism)    DVT left leg   Positive colorectal cancer screening using Cologuard test 04/19/2019   Status post spinal surgery    Stroke [facility])    Substance abuse (HCC)    Substance use disorder 10/05/2022   Tobacco abuse     Family History  Problem Relation Age of Onset   Heart failure Mother    Hypertension Mother    Cancer Mother    Hypertension Father    Depression Sister     Past Surgical History:  Procedure Laterality Date   SPINAL FIXATION SURGERY     cervical   SPINE SURGERY     Social History   Occupational History   Not on file  Tobacco Use   Smoking status: Every [place]    Current packs/Sabey: 1.50    Types: Cigarettes   Smokeless tobacco: Never   Tobacco comments:    increased to 2ppd  Vaping Use   Vaping status: Never Used  Substance and Sexual Activity   Alcohol use: No    Alcohol/week: 0.0 standard drinks of alcohol    Comment: former   Drug use: Yes    Comment: Heroin-just used 3 days ago   Sexual activity: Yes

## 2024-07-12 DIAGNOSIS — Z7689 Persons encountering health services in other specified circumstances: Secondary | ICD-10-CM | POA: Diagnosis not present

## 2024-07-13 DIAGNOSIS — Z7689 Persons encountering health services in other specified circumstances: Secondary | ICD-10-CM | POA: Diagnosis not present

## 2024-07-15 ENCOUNTER — Inpatient Hospital Stay: Admitting: Family Medicine

## 2024-07-15 DIAGNOSIS — Z7689 Persons encountering health services in other specified circumstances: Secondary | ICD-10-CM | POA: Diagnosis not present

## 2024-07-16 ENCOUNTER — Ambulatory Visit: Payer: Self-pay

## 2024-07-16 ENCOUNTER — Other Ambulatory Visit: Payer: Self-pay | Admitting: *Deleted

## 2024-07-16 ENCOUNTER — Telehealth: Payer: Self-pay

## 2024-07-16 ENCOUNTER — Telehealth: Payer: Self-pay | Admitting: *Deleted

## 2024-07-16 DIAGNOSIS — Z7689 Persons encountering health services in other specified circumstances: Secondary | ICD-10-CM | POA: Diagnosis not present

## 2024-07-16 NOTE — Telephone Encounter (Signed)
 Julie F., nurse case manager with [facility] called stating that patient is not wearing VIVe wear and that he is not able to afford it.  Stated that patient is not wearing compression stockings neither.  Wanted to know if a Rx for Vashe could be sent to [facility] pharmacy, stated that patient was out.  CB# for Mliss Hummer [phone].  CB# for patient is [phone].  Please advise.  Thank you.

## 2024-07-16 NOTE — Patient Outreach (Signed)
 Transition of Care week 2  Visit Note  07/16/2024  Name: [name] [name] MRN: [id number]          DOB: 06-Sep-1961  Situation: Patient enrolled in [facility] program. Visit completed with patient by telephone.   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   onset 02/14/14   Opioid abuse (HCC)    PE (pulmonary embolism)    DVT left leg   Positive colorectal cancer screening using Cologuard test 04/19/2019   Status post spinal surgery    Stroke [facility])    Substance abuse (HCC)    Substance use disorder 10/05/2022   Tobacco abuse     Assessment: Patient Reported Symptoms: Cognitive Cognitive Status: No symptoms reported, Alert and oriented to person, place, and time, Normal speech and language skills, Able to follow simple commands      Neurological Neurological Review of Symptoms: No symptoms reported    HEENT HEENT Symptoms Reported: No symptoms reported      Cardiovascular Cardiovascular Symptoms Reported: No symptoms reported    Respiratory Respiratory Symptoms Reported: No symptoms reported    Endocrine Endocrine Symptoms Reported: No symptoms reported Is patient diabetic?: No    Gastrointestinal Gastrointestinal Symptoms Reported: No symptoms reported      Genitourinary Genitourinary Symptoms Reported: No symptoms reported    Integumentary Integumentary Symptoms Reported: Other Other Integumentary Symptoms: pt has chronic lymphedema,  chronic venous insufficiency, pt has clear fluid draining but no open wounds Additional Integumentary Details: pt was instructed to pickup Vive wear compression hose (was given a prescription by Dr. Crist office), pt has not ordered on website yet and states he is not going to,  RN CM spoke with April- triage at Dr. Crist, reported pt is not wearing Vive wear and is not going to, does not have money to go to specific website and order, cannot get from pharmacy or DME company, ask if pt can wear compression hose he already has on hand, ask that new script for Vashe solution be called into [facility] in [place] as pt is out of.   April will report to provider and call RN and pt back. Skin Management Strategies: Routine screening, Adequate rest Skin Self-Management Outcome: 3 (uncertain) Skin Comment: Reinforced signs/ symptoms of infection, importance of elevating legs, instructed pt to wear compression hose  Musculoskeletal Musculoskelatal Symptoms Reviewed: Limited mobility Additional Musculoskeletal Details: uses cane and walker Musculoskeletal Management Strategies: Adequate rest, Medical device Musculoskeletal Self-Management Outcome: 4 (good) Musculoskeletal Comment: reinforced safety precautions      Psychosocial Psychosocial Symptoms Reported: No symptoms reported         There were no vitals filed for this visit.  Medications Reviewed Today     Reviewed by Aura Mliss LABOR, RN (Registered Nurse) on 07/16/24 at 1421  Med List Status: <None>   Medication Order Taking? Sig Documenting Provider  Last Dose Status Informant  albuterol  (VENTOLIN  HFA) 108 (90 Base) MCG/ACT inhaler [id number]  Inhale 2 puffs into the lungs every 6 (six) hours as needed for wheezing or shortness of breath. [name], FNP  Active Self, Pharmacy Records  DULoxetine  (CYMBALTA ) 30 MG capsule [id number]  Take 1 capsule (30 mg total) by mouth daily. Laurence Locus, DO  Active   furosemide  (LASIX ) 40 MG  tablet [id number]  Take 1 tablet (40 mg total) by mouth in the morning. Laurence Locus, DO  Active   gabapentin  (NEURONTIN ) 100 MG capsule [id number]  Take 2 capsules (200 mg total) by mouth 3 (three) times daily. Laurence Locus, DO  Active   loratadine  (CLARITIN ) 10 MG tablet [id number]  Take 1 tablet (10 mg total) by mouth daily. [name], FNP  Active Self, Pharmacy Records  magnesium  oxide (MAG-OX) 400 (240 Mg) MG tablet [id number]  Take 1 tablet (400 mg total) by mouth daily. Laurence Locus, DO  Active   methadone  (DOLOPHINE ) 10 MG/ML solution [id number]  Take 135 mg by mouth daily. [provider]  Active Self, Pharmacy Records           Med Note [name] Pablo Jul 01, 2024 12:48 PM) Verified dosage at [facility] with Veandra -- 135mg  daily  potassium chloride  SA (KLOR-CON  M) 20 MEQ tablet [id number]  Take 1 tablet (20 mEq total) by mouth daily. Laurence Locus, DO  Active   rivaroxaban  (XARELTO ) 20 MG TABS tablet [id number]  Take 1 tablet (20 mg total) by mouth daily with supper. [name], FNP  Active Self, Pharmacy Records  Med List Note [name], CPhT 07/01/24 1248): New Season (Methadone  Clinic) Last dose - 07/01/24 135mg             Goals Addressed             This Visit's Progress    VBCI Transitions of Care (TOC) Care Plan       Problems:  Recent Hospitalization for treatment of cellulitis LE bil Knowledge Deficit Related to cellulitis management Pt has friend temporarily staying with help and assisting with wound care, pt to see Dr. Harden today 9/2, primary care provider 9/3, pt states swelling and redness have resolved 07/16/24- pt reports he saw PA at Dr. Crist office, instructed to purchase Vive wear off a specific website (not available anywhere else), pt states he is not going to purchase, states legs have no open wounds but has chronic clear weeping from legs, pt was instructed to wear Vive compression hose daily, clean with Vashe solution that  he got from the hospital, pt states he is out of Vashe solution, pt has his own compression hose on hand  Goal:  Over the next 30 days, the patient will not experience hospital readmission  Interventions: Cellulitis Transitions of Care: Doctor Visits  - discussed the importance of doctor visits Post-op wound/incision care reviewed with patient/caregiver Reviewed Signs and symptoms of infection Evaluation of current treatment plan related to cellultis, self-management and patient's adherence to plan as established by provider. Discussed plans with patient for ongoing care management follow up and provided patient with direct contact information for care management team Evaluation of current treatment plan related to cellulitis and patient's adherence to plan as established by provider Reviewed medications with patient and discussed importance of taking as prescribed Collaborated with April - triage at Dr. Crist office regarding pt states he is not going to purchase Vive wear compression hose  and that pt has his own compression hose at home, ask if okay for pt to wear his own compression hose, reported pt is out of Vashe solution, request prescription be called into [facility] in [place], ask April to call pt and RN CM with further instructions or change in plan of care Discussed plans with patient for ongoing care management follow up and provided patient with direct contact information for care management team Reviewed instructions from visit with orthopedics- cleanse with Vashe solution, may shower, wear compression hose daily  Patient Self Care Activities:  Attend all scheduled provider appointments Attend church or other social activities Call pharmacy for medication refills 3-7 days in advance of running out of medications Call provider office for new concerns or questions  Notify RN Care Manager of TOC call rescheduling needs Participate in Transition of Care Program/Attend TOC  scheduled calls Take medications as prescribed   Report any signs/ symptoms of infection immediately Wear compression hose daily  Plan:  Telephone follow up appointment with care management team member scheduled for:  07/23/24 @ 215 pm The patient has been provided with contact information for the care management team and has been advised to call with any health related questions or concerns.         Recommendation:   PCP Follow-up Specialty provider follow-up Dr. Harden  Follow Up Plan:   Telephone follow-up 07/23/24 @ 215 pm  Mliss Creed [facility], BSN RN Care Manager/ Transition of Care [place]/ [facility] [phone]

## 2024-07-16 NOTE — Patient Outreach (Addendum)
 07/16/2024  Patient ID: [name] [name], male   DOB: 1961/06/10, 63 y.o.   MRN: [id number]  Secure chat message received from [facility] RMA at Dr. Crist office reporting pt can get Vive compression socks online or at [facility] discount medical for $39. Vashe for cleansing is over the counter $28-30 or can order on [employer], if pt is unable to purchase Vashe, he can wash with dial soap and water and wear compression hose, pt already has his own compression hose.  Telephone call with pt and informed him of the above, to use dial soap and water and use compression hose he has on hand, pt verbalizes understanding, pt has pneumatic compression but has not been using,  RN CM encouraged pt to wrap a thin towel or some type of sheet (cut into smaller pieces) around his leg and use the pneumatic compression if he has weeping of the legs, pt verbalizes understanding, states he owns one towel so this most likely will not be an option.   Pt to see primary care provider on Friday 07/19/24, states he has appointment with wound care center next week 07/24/24.  Mliss Creed [facility], BSN RN Care Manager/ Transition of Care [place]/ [facility] [phone]

## 2024-07-16 NOTE — Telephone Encounter (Signed)
 FYI Only or Action Required?: Action required by provider: clinical question for provider.  Patient was last seen in primary care on 05/08/2024 by [name], FNP.  Called Nurse Triage reporting Advice Only.  Symptoms began several days ago.  Interventions attempted: Nothing.  Symptoms are: N/A..  Triage Disposition: Call PCP Within 24 Hours  Patient/caregiver understands and will follow disposition?: Yes            Message from Mia F sent at 07/16/2024 11:39 AM EDT  Pt mentions Dr Harden assistant told him to wear compression socks over top of the wounds. He says he is unsure about this and would like for someone to confirm this for him. Please advise   Reason for Disposition  [1] Follow-up call from patient regarding patient's clinical status AND [2] information NON-URGENT  Answer Assessment - Initial Assessment Questions 1. REASON FOR CALL or QUESTION: What is your reason for calling today? or How can I best     Patient states he saw Dr Crist assistant (wound care/ortho) and was told to buy the compression socks they recommended (he states they are different/better and that is why they recommend that brand). He states he currently has open wounds on his left calf and right leg near ankle (weeping wounds). He was instructed to wear the compression socks over the wounds. Patient states he has been dealing with venous insufficiency for years now and he already has compression socks at the house. He states he has never heard that it is okay to wear compression socks over an open wound as it would harbor  bacteria and worsen infection. He states Dr Crist office told him that with their compression socks it is okay to wear them. He states Dr Harden is the inventor of the socks and he can't find any studies to back the findings; they're pedaling their own agenda.  Advised patient that concerns regarding his wound dressings and compression socks should also be discussed with the  specialist. Patient states he did not express his concerns to the specialist because he did not have enough time. He expresses concerns about the care he received from Dr Harden as he states they took his dressings off his legs and didn't fully redress them. Patient states he felt uncomfortable with the care he received there. Patient states he has an appt at PCP office on Friday and would like his PCP, NP Howard's advice.  2. CALLER: Document the source of call. (e.g., laboratory staff, caregiver or patient).     Patient.  Protocols used: PCP Call - No Triage-A-AH

## 2024-07-16 NOTE — Telephone Encounter (Signed)
 Sent a message to [place] secure chat to advise pt can obtain socks at multiple in town locations or on line. Online option gives a 20% discount code using [employer] and the price prior to discount is 39$ Vashe does not require a rx and the can be purchased OTC at the pharmacy or guam typically retail price on this is around 28-30$ for a 8.5 oz bottle. To let me know if there is anything that I can do to help.

## 2024-07-16 NOTE — Patient Instructions (Signed)
 Visit Information  Thank you for taking time to visit with me today. Please don't hesitate to contact me if I can be of assistance to you before our next scheduled telephone appointment.  Our next appointment is by telephone on 07/23/24 @ 215 pm  Following is a copy of your care plan:   Goals Addressed             This Visit's Progress    VBCI Transitions of Care (TOC) Care Plan       Problems:  Recent Hospitalization for treatment of cellulitis LE bil Knowledge Deficit Related to cellulitis management Pt has friend temporarily staying with help and assisting with wound care, pt to see Dr. Harden today 9/2, primary care provider 9/3, pt states swelling and redness have resolved 07/16/24- pt reports he saw PA at Dr. Crist office, instructed to purchase Vive wear off a specific website (not available anywhere else), pt states he is not going to purchase, states legs have no open wounds but has chronic clear weeping from legs, pt was instructed to wear Vive compression hose daily, clean with Vashe solution that he got from the hospital, pt states he is out of Vashe solution, pt has his own compression hose on hand  Goal:  Over the next 30 days, the patient will not experience hospital readmission  Interventions: Cellulitis Transitions of Care: Doctor Visits  - discussed the importance of doctor visits Post-op wound/incision care reviewed with patient/caregiver Reviewed Signs and symptoms of infection Evaluation of current treatment plan related to cellultis, self-management and patient's adherence to plan as established by provider. Discussed plans with patient for ongoing care management follow up and provided patient with direct contact information for care management team Evaluation of current treatment plan related to cellulitis and patient's adherence to plan as established by provider Reviewed medications with patient and discussed importance of taking as prescribed Collaborated  with April - triage at Dr. Crist office regarding pt states he is not going to purchase Vive wear compression hose and that pt has his own compression hose at home, ask if okay for pt to wear his own compression hose, reported pt is out of Vashe solution, request prescription be called into [facility] in [place], ask April to call pt and RN CM with further instructions or change in plan of care Discussed plans with patient for ongoing care management follow up and provided patient with direct contact information for care management team Reviewed instructions from visit with orthopedics- cleanse with Vashe solution, may shower, wear compression hose daily  Patient Self Care Activities:  Attend all scheduled provider appointments Attend church or other social activities Call pharmacy for medication refills 3-7 days in advance of running out of medications Call provider office for new concerns or questions  Notify RN Care Manager of TOC call rescheduling needs Participate in Transition of Care Program/Attend TOC scheduled calls Take medications as prescribed   Report any signs/ symptoms of infection immediately Wear compression hose daily  Plan:  Telephone follow up appointment with care management team member scheduled for:  07/23/24 @ 215 pm The patient has been provided with contact information for the care management team and has been advised to call with any health related questions or concerns.         Patient verbalizes understanding of instructions and care plan provided today and agrees to view in MyChart. Active MyChart status and patient understanding of how to access instructions and care plan via MyChart confirmed with  patient.     Telephone follow up appointment with care management team member scheduled for:  07/23/24 @ 215 pm  Please call the care guide team at [phone] if you need to cancel or reschedule your appointment.   Please call the Suicide and Crisis  Lifeline: 988 call the [place]  National Suicide Prevention Lifeline: (518)027-2338 or TTY: 639-554-4785 TTY 7860807403) to talk to a trained counselor call 1-800-273-TALK (toll free, 24 hour hotline) go to [facility] [address] [place] [phone]) call the [facility] Crisis Line: 203 059 4354 call 911 if you are experiencing a Mental Health or Behavioral Health Crisis or need someone to talk to.  Mliss Creed [facility], BSN RN Care Manager/ Transition of Care [place]/ [facility] [phone]

## 2024-07-17 DIAGNOSIS — Z7689 Persons encountering health services in other specified circumstances: Secondary | ICD-10-CM | POA: Diagnosis not present

## 2024-07-18 DIAGNOSIS — G4733 Obstructive sleep apnea (adult) (pediatric): Secondary | ICD-10-CM | POA: Diagnosis not present

## 2024-07-18 DIAGNOSIS — Z7689 Persons encountering health services in other specified circumstances: Secondary | ICD-10-CM | POA: Diagnosis not present

## 2024-07-19 ENCOUNTER — Encounter: Payer: Self-pay | Admitting: Family Medicine

## 2024-07-19 ENCOUNTER — Ambulatory Visit: Admitting: Family Medicine

## 2024-07-19 ENCOUNTER — Other Ambulatory Visit: Payer: Self-pay | Admitting: Family Medicine

## 2024-07-19 VITALS — BP 138/80 | HR 78 | Temp 98.4°F | Ht 71.0 in | Wt 272.4 lb

## 2024-07-19 DIAGNOSIS — Z419 Encounter for procedure for purposes other than remedying health state, unspecified: Secondary | ICD-10-CM | POA: Diagnosis not present

## 2024-07-19 DIAGNOSIS — R600 Localized edema: Secondary | ICD-10-CM | POA: Diagnosis not present

## 2024-07-19 DIAGNOSIS — I5032 Chronic diastolic (congestive) heart failure: Secondary | ICD-10-CM

## 2024-07-19 DIAGNOSIS — F32A Depression, unspecified: Secondary | ICD-10-CM | POA: Diagnosis not present

## 2024-07-19 DIAGNOSIS — Z7901 Long term (current) use of anticoagulants: Secondary | ICD-10-CM | POA: Diagnosis not present

## 2024-07-19 DIAGNOSIS — G894 Chronic pain syndrome: Secondary | ICD-10-CM

## 2024-07-19 DIAGNOSIS — L03116 Cellulitis of left lower limb: Secondary | ICD-10-CM | POA: Diagnosis not present

## 2024-07-19 DIAGNOSIS — I10 Essential (primary) hypertension: Secondary | ICD-10-CM | POA: Diagnosis not present

## 2024-07-19 DIAGNOSIS — F419 Anxiety disorder, unspecified: Secondary | ICD-10-CM

## 2024-07-19 DIAGNOSIS — Z23 Encounter for immunization: Secondary | ICD-10-CM | POA: Diagnosis not present

## 2024-07-19 DIAGNOSIS — Z7689 Persons encountering health services in other specified circumstances: Secondary | ICD-10-CM | POA: Diagnosis not present

## 2024-07-19 DIAGNOSIS — J449 Chronic obstructive pulmonary disease, unspecified: Secondary | ICD-10-CM

## 2024-07-19 DIAGNOSIS — J302 Other seasonal allergic rhinitis: Secondary | ICD-10-CM

## 2024-07-19 DIAGNOSIS — L03115 Cellulitis of right lower limb: Secondary | ICD-10-CM

## 2024-07-19 LAB — COMPREHENSIVE METABOLIC PANEL WITH GFR
AG Ratio: 1.2 (calc) (ref 1.0–2.5)
ALT: 9 U/L (ref 9–46)
AST: 12 U/L (ref 10–35)
Albumin: 3.7 g/dL (ref 3.6–5.1)
Alkaline phosphatase (APISO): 77 U/L (ref 35–144)
BUN: 14 mg/dL (ref 7–25)
CO2: 27 mmol/L (ref 20–32)
Calcium: 8.8 mg/dL (ref 8.6–10.3)
Chloride: 104 mmol/L (ref 98–110)
Creat: 0.93 mg/dL (ref 0.70–1.35)
Globulin: 3.2 g/dL (ref 1.9–3.7)
Glucose, Bld: 85 mg/dL (ref 65–99)
Potassium: 3.9 mmol/L (ref 3.5–5.3)
Sodium: 138 mmol/L (ref 135–146)
Total Bilirubin: 0.3 mg/dL (ref 0.2–1.2)
Total Protein: 6.9 g/dL (ref 6.1–8.1)
eGFR: 93 mL/min/1.73m2 (ref >=60)

## 2024-07-19 LAB — CBC WITH DIFFERENTIAL/PLATELET
Absolute Lymphocytes: 2943 {cells}/uL (ref 850–3900)
Absolute Monocytes: 685 {cells}/uL (ref 200–950)
Basophils Absolute: 86 {cells}/uL (ref 0–200)
Basophils Relative: 0.8 %
Eosinophils Absolute: 268 {cells}/uL (ref 15–500)
Eosinophils Relative: 2.5 %
HCT: 36.4 % — ABNORMAL LOW (ref 38.5–50.0)
Hemoglobin: 11.9 g/dL — ABNORMAL LOW (ref 13.2–17.1)
MCH: 32.3 pg (ref 27.0–33.0)
MCHC: 32.7 g/dL (ref 32.0–36.0)
MCV: 98.9 fL (ref 80.0–100.0)
MPV: 8.8 fL (ref 7.5–12.5)
Monocytes Relative: 6.4 %
Neutro Abs: 6720 {cells}/uL (ref 1500–7800)
Neutrophils Relative %: 62.8 %
Platelets: 285 Thousand/uL (ref 140–400)
RBC: 3.68 Million/uL — ABNORMAL LOW (ref 4.20–5.80)
RDW: 13.2 % (ref 11.0–15.0)
Total Lymphocyte: 27.5 %
WBC: 10.7 Thousand/uL (ref 3.8–10.8)

## 2024-07-19 LAB — TSH: TSH: 1.84 m[IU]/L (ref 0.40–4.50)

## 2024-07-19 LAB — MAGNESIUM: Magnesium: 2 mg/dL (ref 1.5–2.5)

## 2024-07-19 MED ORDER — FUROSEMIDE 40 MG PO TABS: 40.0000 mg | ORAL_TABLET | Freq: Every morning | ORAL | 0 refills | Status: DC

## 2024-07-19 MED ORDER — MAGNESIUM OXIDE -MG SUPPLEMENT 400 (240 MG) MG PO TABS: 400.0000 mg | ORAL_TABLET | Freq: Every day | ORAL | 0 refills | Status: AC

## 2024-07-19 MED ORDER — POTASSIUM CHLORIDE CRYS ER 20 MEQ PO TBCR: 20.0000 meq | EXTENDED_RELEASE_TABLET | Freq: Every day | ORAL | 0 refills | Status: DC

## 2024-07-19 MED ORDER — LORATADINE 10 MG PO TABS: 10.0000 mg | ORAL_TABLET | Freq: Every day | ORAL | 0 refills | Status: DC

## 2024-07-19 MED ORDER — DULOXETINE HCL 30 MG PO CPEP: 30.0000 mg | ORAL_CAPSULE | Freq: Every day | ORAL | 0 refills | Status: DC

## 2024-07-19 MED ORDER — GABAPENTIN 100 MG PO CAPS: 200.0000 mg | ORAL_CAPSULE | Freq: Three times a day (TID) | ORAL | 0 refills | Status: DC

## 2024-07-19 MED ORDER — RIVAROXABAN 20 MG PO TABS
20.0000 mg | ORAL_TABLET | Freq: Every day | ORAL | 0 refills | Status: DC
Start: 2024-07-19 — End: 2024-09-20

## 2024-07-19 NOTE — Patient Instructions (Signed)
 On

## 2024-07-19 NOTE — Progress Notes (Signed)
 Patient Office Visit  Assessment & Plan:  Bilateral lower leg cellulitis  Immunization due -     Flu vaccine trivalent PF, 6mos and older(Flulaval,Afluria,Fluarix,Fluzone)  Anxiety and depression -     DULoxetine  HCl; Take 1 capsule (30 mg total) by mouth daily.  Dispense: 30 capsule; Refill: 0  Essential hypertension -     Magnesium  Oxide -Mg Supplement; Take 1 tablet (400 mg total) by mouth daily.  Dispense: 30 tablet; Refill: 0 -     Potassium Chloride  Crys ER; Take 1 tablet (20 mEq total) by mouth daily.  Dispense: 30 tablet; Refill: 0 -     CBC with Differential/Platelet -     Comprehensive metabolic panel with GFR  Chronic heart failure with preserved ejection fraction (HCC) -     Furosemide ; Take 1 tablet (40 mg total) by mouth in the morning.  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  Dispense: 90 tablet; Refill: 0  Seasonal allergies -     Loratadine ; Take 1 tablet (10 mg total) by mouth daily.  Dispense: 90 tablet; Refill: 0  Bilateral lower extremity edema -     Furosemide ; Take 1 tablet (40 mg total) by mouth in the morning.  Dispense: 30 tablet; Refill: 0 -     Potassium Chloride  Crys ER; Take 1 tablet (20 mEq total) by mouth daily.  Dispense: 30 tablet; Refill: 0 -     TSH -     Magnesium   Chronic pain syndrome -     Gabapentin ; Take 2 capsules (200 mg total) by mouth 3 (three) times daily.  Dispense: 180 capsule; Refill: 0   Assessment and Plan    Cellulitis and localized edema of bilateral lower limbs Bilateral lower limb cellulitis, left leg more severe. Completed oral Duricef post-IV antibiotics. Some improvement, wounds still weeping. Financial constraints limit wound care access for compression stockings. - Await wound care appointment on September  17th. - Provide temporary wrap for leg. - No additional antibiotics until wound care appointment.  Chronic heart failure Chronic heart failure with lymphedema managed by Lasix . Weight gain likely due to swelling. No recent cardiology follow-up. Discussed Lasix  dosage and side effects, including leg cramps and potassium needs. may need to increase Lasix  to 80mg  for a few days.  - Continue Lasix  as prescribed. - Monitor weight and swelling. - Consider cardiology follow-up- he is not sure when he has next appointment. - Ensure potassium supplementation.  Chronic obstructive pulmonary disease (COPD) COPD with reduced smoking from four to two packs per [place]. Increased inhaler use reported. - Encourage smoking cessation. - Continue current inhaler use.  Depression Cymbalta  30 mg effective for mood and pain. - Continue Cymbalta  30 mg.     Test results were reviewed and analyzed as part of the medical decision making of this visit.  Reviewed hospital records and recent labs during office visit. If worsening symptoms he will need to go to ED for further evaluation. Follow up on labs and notify patient patient. Flu shot given today.  Return in about 2 weeks (around 08/02/2024), or if symptoms worsen or fail to improve.   Subjective:    Patient ID: [name] [name], male    DOB: December 24, 1960  Age: 63 y.o. MRN: [id number]  Chief Complaint  Patient presents with   Hospitalization Follow-up    HPI Discussed the use of AI scribe software for clinical note transcription with the patient, who gave verbal consent to proceed.  History of Present Illness       [name] [name] is a 63 year old male is here for hospital follow up due to bilateral lower leg cellulitis and lymphedema who presents with concerns about persistent leg wounds and swelling.  Patient was admitted August 25 and discharged on August 30.  Reviewed discharge summary during the hospital visit.   He was recently hospitalized for a  skin infection, staying for four days, and received IV antibiotics for cellulitis affecting both legs, with the left leg being more severe. He was discharged on oral Duricef, two tablets twice a [place] for seven days, which he completed.  Patient had scans of the femur and tib-fib which did not reveal osteomyelitis.  Despite IV and oral antibiotics, he notes persistent weeping wounds on his legs. He is not sure his legs are much better since hospital discharge. He has a follow-up appointment with a wound care center scheduled for September 17th.  He does have a history of chronic heart failure and takes Lasix  for this and lymphedema  He is on chronic anticoagulation with Xarelto  due to a history of a straddle pulmonary embolism and reports bruising, which he attributes to the blood thinners. He also takes Lasix  40mg  for lymphedema, initiated during his hospital stay, resulting in a 20-pound weight loss while on IV Lasix . He currently weighs 272 pounds, up from 260 pounds prior to hospitalization. He is concerned about the swelling. He is not having shortness of breath at this time.   He is on Cymbalta  30 mg for depression and pain management, which he reports is beginning to help. He has a history of COPD, for which he uses an inhaler. He continues to smoke cigarettes but has reduced his intake from four packs to two packs per [employer]. He reports using his inhaler more frequently and is trying to quit smoking.  He mentions financial constraints, living on supplemental Social Security income, which limits his ability to afford certain medical supplies like compression stockings. These were prescribed by Dr. Duda but no signature on the ordered. He has been unable to obtain these due to a lack of prescription signature and cost issues.  Physical Exam MEASUREMENTS: Weight- 272. CHEST: Lungs clear to auscultation bilaterally. Results LABS   Magnesium : Normal (07/06/2024) Assessment & Plan Cellulitis and localized  edema of bilateral lower limbs Bilateral lower limb cellulitis, left leg more severe. Completed oral Duricef post-IV antibiotics. Some improvement, wounds still weeping. Financial constraints limit wound care access for compression stockings. - Await wound care appointment on September 17th. - Provide temporary wrap for left leg. - No additional antibiotics until wound care appointment.  Chronic heart failure Chronic heart failure with lymphedema managed by Lasix . Weight gain likely due to swelling. No recent cardiology follow-up. Discussed Lasix  dosage and side effects, including leg cramps and potassium needs. may need to increase Lasix  to 80mg  for a few days.  - Continue Lasix  as prescribed. - Monitor weight and swelling. - Consider cardiology follow-up- he is not sure when he has next appointment. - Ensure potassium supplementation.  Chronic obstructive pulmonary disease (COPD) COPD with reduced smoking from four to two packs per [place]. Increased inhaler use reported. - Encourage smoking cessation. - Continue current inhaler use.  Depression Cymbalta  30 mg effective for mood and pain. -  Continue Cymbalta  30 mg.    The ASCVD Risk score (Arnett DK, et al., 2019) failed to calculate for the following reasons:   Risk score cannot be calculated because patient has a medical history suggesting prior/existing ASCVD  Past Medical History:  Diagnosis Date   Allergy    Anxiety    Asthma    Cancer (HCC)    CHF (congestive heart failure) (HCC)    Constipation due to pain medication    COPD (chronic obstructive pulmonary disease) (HCC)    CVA (cerebral infarction) 09/18/2015   Deep venous thrombosis (DVT) of left peroneal vein (HCC) 09/12/2023   Depression    Emphysema of lung (HCC)    History of DVT (deep vein thrombosis) 07/01/2024   History of pulmonary embolism 07/19/2016   Hyperlipidemia    Hypertension    Migraine    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   onset 02/14/14    Opioid abuse (HCC)    PE (pulmonary embolism)    DVT left leg   Positive colorectal cancer screening using Cologuard test 04/19/2019   Status post spinal surgery    Stroke [facility])    Substance abuse (HCC)    Substance use disorder 10/05/2022   Tobacco abuse    Past Surgical History:  Procedure Laterality Date   SPINAL FIXATION SURGERY     cervical   SPINE SURGERY     Social History   Tobacco Use   Smoking status: Every Buckel    Current packs/[place]: 1.50    Average packs/[place]: 1.5 packs/[place] for 15.0 years (22.5 ttl pk-yrs)    Types: Cigarettes   Smokeless tobacco: Never   Tobacco comments:    increased to 2ppd  Vaping Use   Vaping status: Never Used  Substance Use Topics   Alcohol use: No    Comment: former   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    Types: Fentanyl , Heroin, Hydrocodone , Hydromorphone , Morphine , Oxycodone     Comment: Heroin-just used 3 days ago   Family History  Problem Relation Age of Onset   Heart failure Mother    Hypertension Mother    Cancer Mother    Hypertension Father    Depression Sister    Allergies  Allergen Reactions   Amlodipine  Swelling    BLE peripheral edema     ROS    Objective:    BP 138/80   Pulse 78   Temp 98.4 F (36.9 C)   Ht 5' 11 (1.803 m)   Wt 272 lb 6 oz (123.5 kg)   SpO2 97%   BMI 37.99 kg/m  BP Readings from Last 3 Encounters:  07/19/24 138/80  07/06/24 114/77  04/19/24 (!) 152/94   Wt Readings from Last 3 Encounters:  07/19/24 272 lb 6 oz (123.5 kg)  07/05/24 260 lb 9.3 oz (118.2 kg)  04/19/24 255 lb (115.7 kg)    Physical Exam Vitals and nursing note reviewed.  Constitutional:      General: He is not in acute distress.    Appearance: Normal appearance.  HENT:     Head: Normocephalic.     Right Ear: Tympanic membrane, ear canal and external ear normal.     Left Ear: Tympanic membrane, ear canal and external ear normal.  Eyes:     Extraocular Movements: Extraocular movements intact.     Pupils: Pupils are  equal, round, and reactive to light.  Cardiovascular:     Rate and Rhythm: Normal rate and regular rhythm.     Heart sounds:  Normal heart sounds.  Pulmonary:     Effort: Pulmonary effort is normal.     Breath sounds: Normal breath sounds. No wheezing or rhonchi.  Musculoskeletal:     Right lower leg: Edema present.     Left lower leg: Edema present.  Skin:    Findings: Bruising, ecchymosis, erythema and rash present.     Comments: Both legs are erythematous and weeping, left leg is worse (no significant improved compared to hospitalization). We wrapped the left leg today.   Neurological:     General: No focal deficit present.     Mental Status: He is alert and oriented to person, place, and time.  Psychiatric:        Mood and Affect: Mood normal.        Behavior: Behavior normal.        Thought Content: Thought content normal.        Judgment: Judgment normal.   Thank   No results found for any visits on 07/19/24.

## 2024-07-20 DIAGNOSIS — Z7689 Persons encountering health services in other specified circumstances: Secondary | ICD-10-CM | POA: Diagnosis not present

## 2024-07-22 ENCOUNTER — Encounter: Payer: Self-pay | Admitting: Family Medicine

## 2024-07-22 ENCOUNTER — Ambulatory Visit: Payer: Self-pay | Admitting: Family Medicine

## 2024-07-22 ENCOUNTER — Ambulatory Visit: Admitting: Family Medicine

## 2024-07-22 VITALS — BP 151/84 | HR 68 | Temp 98.4°F | Ht 71.0 in | Wt 277.2 lb

## 2024-07-22 DIAGNOSIS — D649 Anemia, unspecified: Secondary | ICD-10-CM | POA: Diagnosis not present

## 2024-07-22 DIAGNOSIS — L03116 Cellulitis of left lower limb: Secondary | ICD-10-CM | POA: Diagnosis not present

## 2024-07-22 DIAGNOSIS — Z7689 Persons encountering health services in other specified circumstances: Secondary | ICD-10-CM | POA: Diagnosis not present

## 2024-07-22 DIAGNOSIS — L03115 Cellulitis of right lower limb: Secondary | ICD-10-CM

## 2024-07-22 NOTE — Progress Notes (Signed)
 Subjective:  HPI: [name] [name] is a 63 y.o. male presenting on 07/22/2024 for Follow-up (Medication management )   HPI Patient is in today for follow up for evaluation of his lower extremity wounds. He did see my partner last week for hospital follow up. He was hospitalized last month for cellulitis and lymphedema. He does have an upcoming wound appointment on September 17 Today he is concerned because his wound is still weeping. He is performing dressing changes himself, last dressing change 2 days ago.He reports today he does cannot afford dressing supplies to perform dressing changes daily as instructed.  Is seeing Dr Harden with Orthopedics. Has completed his antibiotics. Is having trouble obtaining compression stockings and dressing change supplies due to cost.  Denies worsening redness, warmth, drainage, or pain.  Other concerns include his low blood counts now for several months. 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 Denies abdominal pain, melena, hematochezia, or bleeding.   Review of Systems  All other systems reviewed and are negative.   Relevant past medical history reviewed and updated as indicated.   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   Current Outpatient Medications:    albuterol  (VENTOLIN  HFA) 108 (90 Base) MCG/ACT inhaler, Inhale 2 puffs into the lungs every 6 (six) hours as needed for wheezing or shortness of breath., Disp: 8 g, Rfl: 0   cefadroxil  (DURICEF) 500 MG capsule, Take 500 mg by mouth 2 (two) times daily., Disp: , Rfl:    DULoxetine  (CYMBALTA ) 30 MG capsule, Take 1 capsule (30 mg total) by mouth daily., Disp: 30 capsule, Rfl: 0   furosemide  (LASIX ) 40 MG tablet, Take 1 tablet (40 mg total) by mouth in the morning., Disp: 30 tablet, Rfl: 0   gabapentin  (NEURONTIN ) 100 MG capsule, Take 2 capsules (200 mg total) by mouth 3 (three) times daily., Disp: 180 capsule, Rfl: 0   loratadine  (CLARITIN ) 10 MG tablet, Take 1 tablet (10 mg total) by mouth daily., Disp: 90 tablet, Rfl: 0   magnesium  oxide (MAG-OX) 400 (240 Mg) MG tablet, Take 1 tablet (400 mg total) by mouth daily., Disp: 30 tablet, Rfl: 0   methadone  (DOLOPHINE ) 10 MG/ML solution, Take 135 mg by mouth daily., Disp: , Rfl:    potassium chloride  SA (KLOR-CON  M)  20 MEQ tablet, Take 1 tablet (20 mEq total) by mouth daily., Disp: 30 tablet, Rfl: 0   rivaroxaban  (XARELTO ) 20 MG TABS tablet, Take 1 tablet (20 mg total) by mouth daily with supper., Disp: 90 tablet, Rfl: 0  Allergies  Allergen Reactions   Amlodipine  Swelling    BLE peripheral edema     Objective:   BP (!) 151/84   Pulse 68   Temp 98.4 F (36.9 C)   Ht 5' 11 (1.803 m)   Wt 277 lb 3.2 oz (125.7 kg)   SpO2 95%   BMI 38.66 kg/m      07/22/2024    9:54 AM 07/19/2024    9:57 AM 07/06/2024    8:24 AM  Vitals with BMI  Height 5' 11 5' 11   Weight 277 lbs 3 oz 272 lbs 6 oz   BMI 38.68 38.01   Systolic 151 138 885  Diastolic 84 80 77  Pulse  68 78 61     Physical Exam Vitals and nursing note reviewed.  Constitutional:      Appearance: Normal appearance. He is normal weight.  HENT:     Head: Normocephalic and atraumatic.  Cardiovascular:     Rate and Rhythm: Normal rate and regular rhythm.     Pulses: Normal pulses.     Heart sounds: Normal heart sounds.  Pulmonary:     Effort: Pulmonary effort is normal.     Breath sounds: Normal breath sounds.  Musculoskeletal:     Right lower leg: Edema present.     Left lower leg: Edema present.  Skin:    General: Skin is warm and dry.     Capillary Refill: Capillary refill takes less than 2 seconds.     Findings: Erythema present.  Neurological:     General: No focal deficit present.     Mental Status: He is alert and oriented to person, place, and time. Mental status is at baseline.  Psychiatric:        Mood and Affect: Mood normal.        Behavior: Behavior normal.        Thought Content: Thought content normal.        Judgment: Judgment normal.     Assessment & Plan:  Bilateral lower leg cellulitis Assessment & Plan: Both lower extremities continue to appear pink however skin is healing nicely with no observed open draining wounds. Has completed antibiotics. Dressing changed today in office and he was provided some additional supplies. No s/s of infection present. Continue to monitor and keep appts with wound care and ortho.   Orders: -     AMB Referral [facility] Care Management  Anemia, unspecified type Assessment & Plan: Chronic since prior hospitalization and being placed on Xarelto , no red flags on exam or bleeding observed. Sent home with stool card today. Previously positive cologuard and was referred to GI. Provided number to call and schedule this today.       Follow up plan: Return in about 4 weeks (around 08/19/2024) for chronic condition management.  [name] GORMAN Barrio, FNP

## 2024-07-22 NOTE — Assessment & Plan Note (Addendum)
 Chronic since prior hospitalization and being placed on Xarelto , no red flags on exam or bleeding observed. Sent home with stool card today. Previously positive cologuard and was referred to GI. Provided number to call and schedule this today.

## 2024-07-22 NOTE — Assessment & Plan Note (Signed)
 Both lower extremities continue to appear pink however skin is healing nicely with no observed open draining wounds. Has completed antibiotics. Dressing changed today in office and he was provided some additional supplies. No s/s of infection present. Continue to monitor and keep appts with wound care and ortho.

## 2024-07-23 ENCOUNTER — Other Ambulatory Visit: Payer: Self-pay | Admitting: *Deleted

## 2024-07-23 DIAGNOSIS — Z7689 Persons encountering health services in other specified circumstances: Secondary | ICD-10-CM | POA: Diagnosis not present

## 2024-07-23 NOTE — Patient Instructions (Signed)
 Visit Information  Thank you for taking time to visit with me today. Please don't hesitate to contact me if I can be of assistance to you before our next scheduled telephone appointment.  Our next appointment is by telephone on 07/31/24 @ 115 pm  Following is a copy of your care plan:   Goals Addressed             This Visit's Progress    [facility]BCI Transitions of Care (TOC) Care Plan       Problems:  Recent Hospitalization for treatment of cellulitis LE bil Knowledge Deficit Related to cellulitis management Pt has friend temporarily staying with help and assisting with wound care, pt to see Dr. Harden today 9/2, primary care provider 9/3, pt states swelling and redness have resolved 07/16/24- pt reports he saw PA at Dr. Crist office, instructed to purchase Vive wear off a specific website (not available anywhere else), pt states he is not going to purchase, states legs have no open wounds but has chronic clear weeping from legs, pt was instructed to wear Vive compression hose daily, clean with Vashe solution that he got from the hospital, pt states he is out of Vashe solution, pt has his own compression hose on hand 07/23/24- pt reports he follows up with wound care center tomorrow 07/24/24, wants to see what their assessment and plan of care will be before proceeding further about what bandages, etc he may need for his legs at home, pt states no open wounds, clear drainage only.  Goal:  Over the next 30 days, the patient will not experience hospital readmission  Interventions: Cellulitis Transitions of Care: Doctor Visits  - discussed the importance of doctor visits Post-op wound/incision care reviewed with patient/caregiver Reviewed Signs and symptoms of infection Evaluation of current treatment plan related to cellultis, self-management and patient's adherence to plan as established by provider. Discussed plans with patient for ongoing care management follow up and provided patient with  direct contact information for care management team Evaluation of current treatment plan related to   and patient's adherence to plan as established by provider Reviewed medications with patient and discussed importance of taking as prescribed Discussed plans with patient for ongoing care management follow up and provided patient with direct contact information for care management team Reviewed importance of elevating legs, wearing compression hose as prescribed  Patient Self Care Activities:  Attend all scheduled provider appointments Attend church or other social activities Call pharmacy for medication refills 3-7 days in advance of running out of medications Call provider office for new concerns or questions  Notify RN Care Manager of TOC call rescheduling needs Participate in Transition of Care Program/Attend TOC scheduled calls Take medications as prescribed   Report any signs/ symptoms of infection immediately Wear compression hose as prescribed  Plan:  Telephone follow up appointment with care management team member scheduled for:  07/31/24 @ 115 pm The patient has been provided with contact information for the care management team and has been advised to call with any health related questions or concerns.         Patient verbalizes understanding of instructions and care plan provided today and agrees to view in MyChart. Active MyChart status and patient understanding of how to access instructions and care plan via MyChart confirmed with patient.     Telephone follow up appointment with care management team member scheduled for: 07/31/24 @ 115 pm  Please call the care guide team at [phone] if you need to  cancel or reschedule your appointment.   Please call the Suicide and Crisis Lifeline: 988 call the [place]  National Suicide Prevention Lifeline: 818-743-6657 or TTY: 216-255-0890 TTY 365-640-0798) to talk to a trained counselor call 1-800-273-TALK (toll free, 24 hour  hotline) go to [facility] [address] [place] [phone]) call the [facility] Crisis Line: [phone] call 911 if you are experiencing a Mental Health or Behavioral Health Crisis or need someone to talk to.  Mliss Creed [facility], BSN RN Care Manager/ Transition of Care [place]/ [facility] [phone]

## 2024-07-23 NOTE — Patient Outreach (Signed)
 Transition of Care week 3  Visit Note  07/23/2024  Name: [name] [name] MRN: [id number]          DOB: 1960/11/22  Situation: Patient enrolled in [facility] 30-[place] program. Visit completed with patient by telephone.   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   onset 02/14/14   Opioid abuse (HCC)    PE (pulmonary embolism)    DVT left leg   Positive colorectal cancer screening using Cologuard test 04/19/2019   Status post spinal surgery    Stroke [facility])    Substance abuse (HCC)    Substance use disorder 10/05/2022   Tobacco abuse     Assessment: Patient Reported Symptoms: Cognitive Cognitive Status: No symptoms reported, Alert and oriented to person, place, and time, Normal speech and language skills, Able to follow simple commands      Neurological Neurological Review of Symptoms: No symptoms reported    HEENT HEENT Symptoms Reported: No symptoms reported      Cardiovascular Cardiovascular Symptoms Reported: No symptoms reported    Respiratory Respiratory Symptoms Reported: No symptoms reported    Endocrine Endocrine Symptoms Reported: No symptoms reported Is patient diabetic?: No    Gastrointestinal Gastrointestinal Symptoms Reported: No symptoms reported      Genitourinary Genitourinary Symptoms Reported: No symptoms reported    Integumentary Integumentary Symptoms Reported: Other Other Integumentary Symptoms: pt has chronic  lymphedema, chronic venous insufficiency, pt has clear fluid draining but no open wounds, pt states RLE is complete healed Additional Integumentary Details: pt to follow up with wound care center tomorrow 9/17, states he wants to wait and see what they say, pt states if he needs any wound care supplies (e.g. ABD pads, etc), he will request a prescription and take to his pharmacy Skin Management Strategies: Adequate rest, Routine screening Skin Self-Management Outcome: 3 (uncertain) Skin Comment: Reviewed signs/ symptoms of infection, importance of elevating legs,  reinforced importance of wearing compression hose  Musculoskeletal Musculoskelatal Symptoms Reviewed: Limited mobility Additional Musculoskeletal Details: uses cane and walker Musculoskeletal Management Strategies: Adequate rest, Medical device Musculoskeletal Self-Management Outcome: 4 (good) Musculoskeletal Comment: reviewed safety precautions      Psychosocial Psychosocial Symptoms Reported: No symptoms reported         There were no vitals filed for this visit.  Medications Reviewed Today     Reviewed by Aura Mliss LABOR, RN (Registered Nurse) on 07/23/24 at 1426  Med List Status: <None>   Medication Order Taking? Sig Documenting Provider Last Dose Status Informant  albuterol  (VENTOLIN  HFA) 108 (90 Base) MCG/ACT inhaler [id number]  Inhale 2 puffs into the lungs every 6 (six) hours as needed for wheezing or shortness of breath. [name], FNP  Active Self, Pharmacy Records  cefadroxil  (DURICEF) 500 MG capsule [id number]  Take 500 mg by mouth 2 (two) times daily. [provider]  Active  DULoxetine  (CYMBALTA ) 30 MG capsule [id number]  Take 1 capsule (30 mg total) by mouth daily. Aletha Bene, MD  Active   furosemide  (LASIX ) 40 MG tablet [id number]  Take 1 tablet (40 mg total) by mouth in the morning. Aletha Bene, MD  Active   gabapentin  (NEURONTIN ) 100 MG capsule [id number]  Take 2 capsules (200 mg total) by  mouth 3 (three) times daily. Aletha Bene, MD  Active   loratadine  (CLARITIN ) 10 MG tablet [id number]  Take 1 tablet (10 mg total) by mouth daily. Aletha Bene, MD  Active   magnesium  oxide (MAG-OX) 400 (240 Mg) MG tablet [id number]  Take 1 tablet (400 mg total) by mouth daily. Aletha Bene, MD  Active   methadone  (DOLOPHINE ) 10 MG/ML solution [id number]  Take 135 mg by mouth daily. [provider]  Active Self, Pharmacy Records           Med Note [name] Pablo Jul 01, 2024 12:48 PM) Verified dosage at [facility] with Veandra -- 135mg  daily  potassium chloride  SA (KLOR-CON  M) 20 MEQ tablet [id number]  Take 1 tablet (20 mEq total) by mouth daily. Aletha Bene, MD  Active   rivaroxaban  (XARELTO ) 20 MG TABS tablet [id number]  Take 1 tablet (20 mg total) by mouth daily with supper. Aletha Bene, MD  Active   Med List Note [name], CPhT 07/01/24 1248): New Season (M[facility]adone  Clinic) Last dose - 07/01/24 135mg             Goals Addressed             This Visit's Progress    VBCI Transitions of Care (TOC) Care Plan       Problems:  Recent Hospitalization for treatment of cellulitis LE bil Knowledge Deficit Related to cellulitis management Pt has friend temporarily staying with help and assisting with wound care, pt to see Dr. Harden today 9/2, primary care provider 9/3, pt states swelling and redness have resolved 07/16/24- pt reports he saw PA at Dr. Crist office, instructed to purchase Vive wear off a specific website (not available anywhere else), pt states he is not going to purchase, states legs have no open wounds but has chronic clear weeping from legs, pt was instructed to wear Vive compression hose daily, clean with Vashe solution that he got from the hospital, pt states he is out of Vashe solution, pt has his own compression hose on hand 07/23/24- pt reports he follows up with wound care center tomorrow 07/24/24, wants to see what their  assessment and plan of care will be before proceeding further about what bandages, etc he may need for his legs at home, pt states no open wounds, clear drainage only.  Goal:  Over the next 30 days, the patient will not experience hospital readmission  Interventions: Cellulitis Transitions of Care: Doctor Visits  - discussed the importance of doctor visits Post-op wound/incision care reviewed with patient/caregiver Reviewed Signs and symptoms of infection Evaluation of current treatment plan related to cellultis, self-management and patient's adherence to plan as established by provider. Discussed plans with patient for ongoing care management follow up and provided patient with direct contact information for care management team Evaluation of current treatment plan related to   and patient's adherence to plan as established by provider Reviewed medications with patient and discussed importance of taking as prescribed Discussed plans with patient for ongoing care management follow up and provided patient with direct contact information for care management team Reviewed importance  of elevating legs, wearing compression hose as prescribed  Patient Self Care Activities:  Attend all scheduled provider appointments Attend church or other social activities Call pharmacy for medication refills 3-7 days in advance of running out of medications Call provider office for new concerns or questions  Notify RN Care Manager of TOC call rescheduling needs Participate in Transition of Care Program/Attend TOC scheduled calls Take medications as prescribed   Report any signs/ symptoms of infection immediately Wear compression hose as prescribed  Plan:  Telephone follow up appointment with care management team member scheduled for:  07/31/24 @ 115 pm The patient has been provided with contact information for the care management team and has been advised to call with any health related questions or concerns.           Recommendation:   PCP Follow-up Specialty provider follow-up wound care center 07/24/24  Follow Up Plan:   Telephone follow-up 07/31/24 @ 115 pm  Mliss Creed [facility], BSN RN Care Manager/ Transition of Care [place]/ [facility] [phone]

## 2024-07-24 ENCOUNTER — Encounter: Payer: Self-pay | Admitting: Licensed Clinical Social Worker

## 2024-07-24 ENCOUNTER — Encounter (HOSPITAL_BASED_OUTPATIENT_CLINIC_OR_DEPARTMENT_OTHER): Attending: General Surgery | Admitting: General Surgery

## 2024-07-24 DIAGNOSIS — L97812 Non-pressure chronic ulcer of other part of right lower leg with fat layer exposed: Secondary | ICD-10-CM | POA: Diagnosis not present

## 2024-07-24 DIAGNOSIS — I5030 Unspecified diastolic (congestive) heart failure: Secondary | ICD-10-CM | POA: Diagnosis not present

## 2024-07-24 DIAGNOSIS — Z7689 Persons encountering health services in other specified circumstances: Secondary | ICD-10-CM | POA: Diagnosis not present

## 2024-07-24 DIAGNOSIS — L97822 Non-pressure chronic ulcer of other part of left lower leg with fat layer exposed: Secondary | ICD-10-CM | POA: Diagnosis not present

## 2024-07-24 DIAGNOSIS — I890 Lymphedema, not elsewhere classified: Secondary | ICD-10-CM | POA: Diagnosis not present

## 2024-07-24 DIAGNOSIS — I87333 Chronic venous hypertension (idiopathic) with ulcer and inflammation of bilateral lower extremity: Secondary | ICD-10-CM | POA: Insufficient documentation

## 2024-07-24 DIAGNOSIS — F17210 Nicotine dependence, cigarettes, uncomplicated: Secondary | ICD-10-CM | POA: Diagnosis not present

## 2024-07-24 NOTE — Patient Outreach (Signed)
 SW received this emergent referral on 07/22/2024 and called the patient on today. The patient stated that he could not talk right now because he was at the wound care center getting his foot wrapped. Patient agreed to be called back and the SW scheduled to complete the initial on Friday 07/26/2024 at 9:30 am.  This will be a SDOH initial referral and resoures for cost of medical supplies.  [name] CHARM [name], PhD [facility], [facility] Social Worker Direct Dial: [phone]  Fax: [phone]

## 2024-07-25 ENCOUNTER — Ambulatory Visit: Payer: Self-pay

## 2024-07-25 DIAGNOSIS — Z7689 Persons encountering health services in other specified circumstances: Secondary | ICD-10-CM | POA: Diagnosis not present

## 2024-07-25 NOTE — Telephone Encounter (Signed)
 FYI Only or Action Required?: Action required by provider: clinical question for provider and update on patient condition.  Patient was last seen in primary care on 07/22/2024 by [name], FNP.  Called Nurse Triage reporting Wound Check and Leg Swelling.  Symptoms began chronic; he thinks they have been worsening since 9/12.  Interventions attempted: Prescription medications: lasix  40mg  and Other: legs rewrapped at Wound care yesterday.  Symptoms are: mild bilateral leg swelling, rihgt leg he feels has worsened with weeping and was debrided, swollen all over/fluid retention, weight gain (states when he was discharged from the hospital he weighed 254 and is now back to 273) gradually worsening.  Triage Disposition: Call PCP Within 24 Hours- scheduled for his 3-4 week follow up sooner due to worsening of symptoms  Patient/caregiver understands and will follow disposition?: Yes        Copied from CRM #[id number]. Topic: Clinical - Red Word Triage >> Jul 25, 2024 11:13 AM Antwanette L wrote: Red Word that prompted transfer to Nurse Triage: Patient is experiencing pain and swelling in both legs. Reason for Disposition  [1] Weight GAIN > 5 lbs (2 kg) in one week OR [2] 5 lbs (2 kg) over target weight.  Answer Assessment - Initial Assessment Questions 1. ONSET: When did the swelling start? (e.g., minutes, hours, days)     Chronic swelling in legs, has been worsening since 9/12.  2. LOCATION: What part of the leg is swollen?  Are both legs swollen or just one leg?     Both legs, he also states he thinks his whole body is swollen and he has gained 20 pounds since coming home from the hospitalization.  3. SEVERITY: How bad is the swelling? (e.g., localized; mild, moderate, severe)     Mild. Currently rewrapped/dressed from wound care.  4. REDNESS: Is there redness or signs of infection?     He states he could not see the wound on his left calf. He states the wound on his  right leg looked completley healed until he was seen at wound care and then it was debridement and he thinks worse.  5. PAIN: Is the swelling painful to touch? If Yes, ask: How painful is it?   (Scale 1-10; mild, moderate or severe)     Yes, 9/10.  6. FEVER: Do you have a fever? If Yes, ask: What is it, how was it measured, and when did it start?      No.  7. CAUSE: What do you think is causing the leg swelling?     Chronic venous insufficiency, cellulitis.  8. MEDICAL HISTORY: Do you have a history of blood clots (e.g., DVT), cancer, heart failure, kidney disease, or liver failure?     Blood clots (DVT and PE), CHF.  9. RECURRENT SYMPTOM: Have you had leg swelling before? If Yes, ask: When was the last time? What happened that time?     Yes, chronic ongoing issues with swelling and wounds of legs.  10. OTHER SYMPTOMS: Do you have any other symptoms? (e.g., chest pain, difficulty breathing)       Denies chest pain, difficulty breathing.  11. PREGNANCY: Is there any chance you are pregnant? When was your last menstrual period?       N/A.  Protocols used: Leg Swelling and Edema-A-AH, Heart Failure on Treatment Follow-up Call-A-AH

## 2024-07-26 ENCOUNTER — Other Ambulatory Visit: Payer: Self-pay | Admitting: Licensed Clinical Social Worker

## 2024-07-26 ENCOUNTER — Encounter: Payer: Self-pay | Admitting: Licensed Clinical Social Worker

## 2024-07-26 DIAGNOSIS — Z7689 Persons encountering health services in other specified circumstances: Secondary | ICD-10-CM | POA: Diagnosis not present

## 2024-07-27 DIAGNOSIS — Z7689 Persons encountering health services in other specified circumstances: Secondary | ICD-10-CM | POA: Diagnosis not present

## 2024-07-29 ENCOUNTER — Ambulatory Visit: Admitting: Family Medicine

## 2024-07-29 DIAGNOSIS — Z7689 Persons encountering health services in other specified circumstances: Secondary | ICD-10-CM | POA: Diagnosis not present

## 2024-07-30 DIAGNOSIS — Z7689 Persons encountering health services in other specified circumstances: Secondary | ICD-10-CM | POA: Diagnosis not present

## 2024-07-31 ENCOUNTER — Other Ambulatory Visit: Payer: Self-pay | Admitting: *Deleted

## 2024-07-31 ENCOUNTER — Encounter (HOSPITAL_BASED_OUTPATIENT_CLINIC_OR_DEPARTMENT_OTHER): Admitting: General Surgery

## 2024-07-31 DIAGNOSIS — I5030 Unspecified diastolic (congestive) heart failure: Secondary | ICD-10-CM | POA: Diagnosis not present

## 2024-07-31 DIAGNOSIS — Z7689 Persons encountering health services in other specified circumstances: Secondary | ICD-10-CM | POA: Diagnosis not present

## 2024-07-31 DIAGNOSIS — L97812 Non-pressure chronic ulcer of other part of right lower leg with fat layer exposed: Secondary | ICD-10-CM | POA: Diagnosis not present

## 2024-07-31 DIAGNOSIS — L97822 Non-pressure chronic ulcer of other part of left lower leg with fat layer exposed: Secondary | ICD-10-CM | POA: Diagnosis not present

## 2024-07-31 DIAGNOSIS — I890 Lymphedema, not elsewhere classified: Secondary | ICD-10-CM | POA: Diagnosis not present

## 2024-07-31 DIAGNOSIS — F17210 Nicotine dependence, cigarettes, uncomplicated: Secondary | ICD-10-CM | POA: Diagnosis not present

## 2024-07-31 DIAGNOSIS — I87333 Chronic venous hypertension (idiopathic) with ulcer and inflammation of bilateral lower extremity: Secondary | ICD-10-CM | POA: Diagnosis not present

## 2024-07-31 NOTE — Patient Outreach (Signed)
 Transition of Care week 4  Visit Note  07/31/2024  Name: [name] [name] MRN: [id number]          DOB: 1961/06/04  Situation: Patient enrolled in [facility] 30-[place] program. Visit completed with patient by telephone.   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   onset 02/14/14   Opioid abuse (HCC)    PE (pulmonary embolism)    DVT left leg   Positive colorectal cancer screening using Cologuard test 04/19/2019   Status post spinal surgery    Stroke [facility])    Substance abuse (HCC)    Substance use disorder 10/05/2022   Tobacco abuse     Assessment: Patient Reported Symptoms: Cognitive Cognitive Status: Able to follow simple commands, No symptoms reported, Normal speech and language skills, Alert and oriented to person, place, and time      Neurological Neurological Review of Symptoms: No symptoms reported    HEENT HEENT Symptoms Reported: No symptoms reported      Cardiovascular Cardiovascular Symptoms Reported: No symptoms reported    Respiratory Respiratory Symptoms Reported: No symptoms reported    Endocrine Endocrine Symptoms Reported: No symptoms reported Is patient diabetic?: No    Gastrointestinal Gastrointestinal Symptoms Reported: No symptoms reported      Genitourinary Genitourinary Symptoms Reported: No symptoms reported    Integumentary Integumentary Symptoms Reported: Other Other Integumentary Symptoms: chronic lymphedema, chronic  venous insufficiency, pt followed up with wound center today, legs wrapped once weekly, pt to follow up on 08/08/24, pt is using pneumatic compression 2 hours per [name], taking diuretic as prescribed for fluid management Additional Integumentary Details: pt states he does not weigh Skin Management Strategies: Adequate rest, Routine screening, Medication therapy Skin Self-Management Outcome: 3 (uncertain) Skin Comment: Reinforced signs/ symptoms of infection, importance of elevating legs, reinforced importance  Musculoskeletal Musculoskelatal Symptoms Reviewed: Limited mobility Additional Musculoskeletal Details: uses cane and walker Musculoskeletal Management Strategies: Adequate rest, Medical device, Routine screening Musculoskeletal Self-Management Outcome: 4 (good) Musculoskeletal Comment: reinforced safety precautions      Psychosocial Psychosocial Symptoms Reported: No symptoms reported         There were no vitals filed for this visit.  Medications Reviewed Today     Reviewed by Aura Mliss LABOR, RN (Registered Nurse) on 07/31/24 at 1333  Med List Status: <None>   Medication Order Taking? Sig Documenting Provider Last Dose Status Informant  albuterol  (VENTOLIN  HFA) 108 (90 Base) MCG/ACT inhaler [id number]  Inhale 2 puffs into the lungs every 6 (six) hours as needed for wheezing or shortness of breath. [name], FNP  Active Self, Pharmacy Records  cefadroxil  (DURICEF) 500 MG capsule [id number]  Take 500 mg by mouth 2 (two) times daily. [provider]  Active   DULoxetine  (CYMBALTA ) 30 MG capsule [id number]  Take 1 capsule (30 mg total) by mouth daily. Aletha Bene, MD  Active  furosemide  (LASIX ) 40 MG tablet [id number]  Take 1 tablet (40 mg total) by mouth in the morning. Aletha Bene, MD  Active   gabapentin  (NEURONTIN ) 100 MG capsule [id number]  Take 2 capsules (200 mg total) by mouth 3 (three) times daily. Aletha Bene, MD  Active   loratadine  (CLARITIN ) 10 MG  tablet [id number]  Take 1 tablet (10 mg total) by mouth daily. Aletha Bene, MD  Active   magnesium  oxide (MAG-OX) 400 (240 Mg) MG tablet [id number]  Take 1 tablet (400 mg total) by mouth daily. Aletha Bene, MD  Active   methadone  (DOLOPHINE ) 10 MG/ML solution [id number]  Take 135 mg by mouth daily. [provider]  Active Self, Pharmacy Records           Med Note [name] Pablo Jul 01, 2024 12:48 PM) Verified dosage at [facility] with Veandra -- 135mg  daily  potassium chloride  SA (KLOR-CON  M) 20 MEQ tablet [id number]  Take 1 tablet (20 mEq total) by mouth daily. Aletha Bene, MD  Active   rivaroxaban  (XARELTO ) 20 MG TABS tablet [id number]  Take 1 tablet (20 mg total) by mouth daily with supper. Aletha Bene, MD  Active   Med List Note [name], CPhT 07/01/24 1248): New Season (Methadone  Clinic) Last dose - 07/01/24 135mg             Goals Addressed             This Visit's Progress    VBCI Transitions of Care (TOC) Care Plan       Problems:  Recent Hospitalization for treatment of cellulitis LE bil Knowledge Deficit Related to cellulitis management Pt has friend temporarily staying with help and assisting with wound care, pt to see Dr. Harden today 9/2, primary care provider 9/3, pt states swelling and redness have resolved 07/16/24- pt reports he saw PA at Dr. Crist office, instructed to purchase Vive wear off a specific website (not available anywhere else), pt states he is not going to purchase, states legs have no open wounds but has chronic clear weeping from legs, pt was instructed to wear Vive compression hose daily, clean with Vashe solution that he got from the hospital, pt states he is out of Vashe solution, pt has his own compression hose on hand 07/23/24- pt reports he follows up with wound care center tomorrow 07/24/24, wants to see what their assessment and plan of care will be before proceeding further about what bandages, etc he may  need for his legs at home, pt states no open wounds, clear drainage only. 07/31/24- pt reports he followed up with wound care center this morning, will follow up next week on 08/08/24, legs are wrapped weekly, pt is using pneumatic compression 2 hours per Folz, taking diuretic as prescribed for fluid management, pt states he does not weigh  Goal:  Over the next 30 days, the patient will not experience hospital readmission  Interventions: Cellulitis Transitions of Care: Doctor Visits  - discussed the importance of doctor visits Post-op wound/incision care reviewed with patient/caregiver Reviewed Signs and symptoms of infection Evaluation of current treatment plan related to cellultis, self-management and patient's adherence to plan as established by provider. Discussed plans with patient for ongoing care management follow up and provided patient with direct contact information for care management team Evaluation of current treatment plan related to cellulitis and patient's adherence to plan as established by provider Reviewed medications with patient and discussed importance of taking as prescribed Discussed plans  with patient for ongoing care management follow up and provided patient with direct contact information for care management team Reinforced importance of elevating legs, wearing compression hose as prescribed Reviewed importance of using pneumatic compression as prescribed Reviewed role of diuretic in fluid management  Patient Self Care Activities:  Attend all scheduled provider appointments Attend church or other social activities Call pharmacy for medication refills 3-7 days in advance of running out of medications Call provider office for new concerns or questions  Notify RN Care Manager of TOC call rescheduling needs Participate in Transition of Care Program/Attend TOC scheduled calls Take medications as prescribed   Report any signs/ symptoms of infection immediately Use  pneumatic compression as prescribed  Plan:  Telephone follow up appointment with care management team member scheduled for:  08/08/24 @ 115 pm The patient has been provided with contact information for the care management team and has been advised to call with any health related questions or concerns.          Recommendation:   PCP Follow-up Specialty provider follow-up wound care  Follow Up Plan:   Telephone follow-up 08/08/24 @ 115 pm  Mliss Creed [facility], BSN RN Care Manager/ Transition of Care [place]/ [facility] [phone]

## 2024-07-31 NOTE — Patient Instructions (Signed)
 Visit Information  Thank you for taking time to visit with me today. Please don't hesitate to contact me if I can be of assistance to you before our next scheduled telephone appointment.  Our next appointment is by telephone on 08/08/24 @ 115 pm  Following is a copy of your care plan:   Goals Addressed             This Visit's Progress    VBCI Transitions of Care (TOC) Care Plan       Problems:  Recent Hospitalization for treatment of cellulitis LE bil Knowledge Deficit Related to cellulitis management Pt has friend temporarily staying with help and assisting with wound care, pt to see Dr. Harden today 9/2, primary care provider 9/3, pt states swelling and redness have resolved 07/16/24- pt reports he saw PA at Dr. Crist office, instructed to purchase Vive wear off a specific website (not available anywhere else), pt states he is not going to purchase, states legs have no open wounds but has chronic clear weeping from legs, pt was instructed to wear Vive compression hose daily, clean with Vashe solution that he got from the hospital, pt states he is out of Vashe solution, pt has his own compression hose on hand 07/23/24- pt reports he follows up with wound care center tomorrow 07/24/24, wants to see what their assessment and plan of care will be before proceeding further about what bandages, etc he may need for his legs at home, pt states no open wounds, clear drainage only. 07/31/24- pt reports he followed up with wound care center this morning, will follow up next week on 08/08/24, legs are wrapped weekly, pt is using pneumatic compression 2 hours per [place], taking diuretic as prescribed for fluid management, pt states he does not weigh  Goal:  Over the next 30 days, the patient will not experience hospital readmission  Interventions: Cellulitis Transitions of Care: Doctor Visits  - discussed the importance of doctor visits Post-op wound/incision care reviewed with patient/caregiver Reviewed  Signs and symptoms of infection Evaluation of current treatment plan related to cellultis, self-management and patient's adherence to plan as established by provider. Discussed plans with patient for ongoing care management follow up and provided patient with direct contact information for care management team Evaluation of current treatment plan related to cellulitis and patient's adherence to plan as established by provider Reviewed medications with patient and discussed importance of taking as prescribed Discussed plans with patient for ongoing care management follow up and provided patient with direct contact information for care management team Reinforced importance of elevating legs, wearing compression hose as prescribed Reviewed importance of using pneumatic compression as prescribed Reviewed role of diuretic in fluid management  Patient Self Care Activities:  Attend all scheduled provider appointments Attend church or other social activities Call pharmacy for medication refills 3-7 days in advance of running out of medications Call provider office for new concerns or questions  Notify RN Care Manager of TOC call rescheduling needs Participate in Transition of Care Program/Attend TOC scheduled calls Take medications as prescribed   Report any signs/ symptoms of infection immediately Use pneumatic compression as prescribed  Plan:  Telephone follow up appointment with care management team member scheduled for:  08/08/24 @ 115 pm The patient has been provided with contact information for the care management team and has been advised to call with any health related questions or concerns.         Patient verbalizes understanding of instructions and care plan  provided today and agrees to view in MyChart. Active MyChart status and patient understanding of how to access instructions and care plan via MyChart confirmed with patient.     Telephone follow up appointment with care  management team member scheduled for:   08/08/24 @ 115 pm Please call the care guide team at [phone] if you need to cancel or reschedule your appointment.   Please call the Suicide and Crisis Lifeline: 988 call the [place]  National Suicide Prevention Lifeline: 367-813-7756 or TTY: (407)051-7366 TTY (518)461-5702) to talk to a trained counselor call 1-800-273-TALK (toll free, 24 hour hotline) go to [facility] [address], [place] [phone]) call the [facility] Crisis Line: [phone] call 911 if you are experiencing a Mental Health or Behavioral Health Crisis or need someone to talk to.  Mliss Creed [facility], BSN RN Care Manager/ Transition of Care [place]/ [facility] [phone]

## 2024-08-01 ENCOUNTER — Ambulatory Visit: Admitting: Physician Assistant

## 2024-08-01 DIAGNOSIS — Z7689 Persons encountering health services in other specified circumstances: Secondary | ICD-10-CM | POA: Diagnosis not present

## 2024-08-02 DIAGNOSIS — Z7689 Persons encountering health services in other specified circumstances: Secondary | ICD-10-CM | POA: Diagnosis not present

## 2024-08-03 DIAGNOSIS — Z7689 Persons encountering health services in other specified circumstances: Secondary | ICD-10-CM | POA: Diagnosis not present

## 2024-08-05 DIAGNOSIS — Z7689 Persons encountering health services in other specified circumstances: Secondary | ICD-10-CM | POA: Diagnosis not present

## 2024-08-06 DIAGNOSIS — Z7689 Persons encountering health services in other specified circumstances: Secondary | ICD-10-CM | POA: Diagnosis not present

## 2024-08-07 DIAGNOSIS — Z7689 Persons encountering health services in other specified circumstances: Secondary | ICD-10-CM | POA: Diagnosis not present

## 2024-08-07 NOTE — Addendum Note (Signed)
 Addended by: [name] on: 08/07/2024 03:50 PM   Modules accepted: Level of Service

## 2024-08-08 ENCOUNTER — Other Ambulatory Visit: Payer: Self-pay | Admitting: *Deleted

## 2024-08-08 ENCOUNTER — Encounter (HOSPITAL_BASED_OUTPATIENT_CLINIC_OR_DEPARTMENT_OTHER): Attending: General Surgery | Admitting: Internal Medicine

## 2024-08-08 DIAGNOSIS — Z029 Encounter for administrative examinations, unspecified: Secondary | ICD-10-CM | POA: Insufficient documentation

## 2024-08-08 DIAGNOSIS — Z7689 Persons encountering health services in other specified circumstances: Secondary | ICD-10-CM | POA: Diagnosis not present

## 2024-08-08 DIAGNOSIS — I890 Lymphedema, not elsewhere classified: Secondary | ICD-10-CM

## 2024-08-08 NOTE — Patient Outreach (Signed)
 Transition of Care week 5  Visit Note  08/08/2024  Name: [name] [name] MRN: [id number]          DOB: Aug 01, 1961  Situation: Patient enrolled in [facility]-Igou program. Visit completed with patient by telephone.   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   onset 02/14/14   Opioid abuse (HCC)    PE (pulmonary embolism)    DVT left leg   Positive colorectal cancer screening using Cologuard test 04/19/2019   Status post spinal surgery    Stroke [facility])    Substance abuse (HCC)    Substance use disorder 10/05/2022   Tobacco abuse     Assessment: Patient Reported Symptoms: Cognitive Cognitive Status: No symptoms reported, Alert and oriented to person, place, and time, Able to follow simple commands, Normal speech and language skills      Neurological Neurological Review of Symptoms: No symptoms reported    HEENT HEENT Symptoms Reported: No symptoms reported      Cardiovascular Cardiovascular Symptoms Reported: No symptoms reported    Respiratory Respiratory Symptoms Reported: No symptoms reported    Endocrine Endocrine Symptoms Reported: No symptoms reported Is patient diabetic?: No    Gastrointestinal Gastrointestinal Symptoms Reported: No symptoms reported      Genitourinary Genitourinary Symptoms Reported: No symptoms reported    Integumentary Integumentary Symptoms Reported: Other Other Integumentary Symptoms: chronic lymphedema, chronic  venous insufficiency, pt followed up with wound center today, pt reports left leg is completely healed and right leg almost healed, pt is using pneumatic compression daily x 2 hours, taking diuretic as prescribed for fluid management Additional Integumentary Details: pt states he does not weigh, not interested Skin Management Strategies: Adequate rest, Medical device, Routine screening, Dressing changes, Medication therapy Skin Self-Management Outcome: 3 (uncertain) Skin Comment: Reviewed signs/ symptoms of infection, importance of elevating legs  Musculoskeletal Musculoskelatal Symptoms Reviewed: Limited mobility Additional Musculoskeletal Details: cane and walker Musculoskeletal Management Strategies: Adequate rest, Medical device, Routine screening Musculoskeletal Self-Management Outcome: 4 (good) Musculoskeletal Comment: reviewed safety precautions      Psychosocial Psychosocial Symptoms Reported: No symptoms reported         There were no vitals filed for this visit.  Medications Reviewed Today     Reviewed by Aura Mliss LABOR, RN (Registered Nurse) on 08/08/24 at 1317  Med List Status: <None>   Medication Order Taking? Sig Documenting Provider Last Dose Status Informant  albuterol  (VENTOLIN  HFA) 108 (90 Base) MCG/ACT inhaler [id number]  Inhale 2 puffs into the lungs every 6 (six) hours as needed for wheezing or shortness of breath. [name], FNP  Active Self, Pharmacy Records  cefadroxil  (DURICEF) 500 MG capsule [id number]  Take 500 mg by mouth 2 (two) times daily. [provider]  Active   DULoxetine  (CYMBALTA ) 30 MG capsule [id number]  Take 1 capsule (30 mg total) by mouth daily.  Aletha Bene, MD  Active   furosemide  (LASIX ) 40 MG tablet [id number]  Take 1 tablet (40 mg total) by mouth in the morning. Aletha Bene, MD  Active   gabapentin  (NEURONTIN ) 100 MG capsule [id number]  Take 2 capsules (200 mg total) by mouth 3 (three) times daily. Aletha Bene, MD   Active   loratadine  (CLARITIN ) 10 MG tablet [id number]  Take 1 tablet (10 mg total) by mouth daily. Aletha Bene, MD  Active   magnesium  oxide (MAG-OX) 400 (240 Mg) MG tablet [id number]  Take 1 tablet (400 mg total) by mouth daily. Aletha Bene, MD  Active   methadone  (DOLOPHINE ) 10 MG/ML solution [id number]  Take 135 mg by mouth daily. [provider]  Active Self, Pharmacy Records           Med Note [name] [place] Jul 01, 2024 12:48 PM) Verified dosage at [facility] with Veandra -- 135mg  daily  potassium chloride  SA (KLOR-CON  M) 20 MEQ tablet [id number]  Take 1 tablet (20 mEq total) by mouth daily. Aletha Bene, MD  Active   rivaroxaban  (XARELTO ) 20 MG TABS tablet [id number]  Take 1 tablet (20 mg total) by mouth daily with supper. Aletha Bene, MD  Active   Med List Note [name], CPhT 07/01/24 1248): [facility] (Methadone  Clinic) Last dose - 07/01/24 135mg             Recommendation:   PCP Follow-up Specialty provider follow-up wound center  Follow Up Plan:   Closing From:  Transitions of Care Program Will be followed by longitudinal RN case manager  Mliss Creed [facility], BSN RN Care Manager/ Transition of Care [place]/ [facility] [phone]

## 2024-08-08 NOTE — Patient Instructions (Signed)
 Visit Information  Thank you for taking time to visit with me today. Please don't hesitate to contact me if I can be of assistance to you before our next scheduled telephone appointment.  Our next appointment is no further scheduled appointments.   Following is a copy of your care plan:   Goals Addressed             This Visit's Progress    COMPLETED: VBCI Transitions of Care (TOC) Care Plan       Problems:  Recent Hospitalization for treatment of cellulitis LE bil Knowledge Deficit Related to cellulitis management Pt has friend temporarily staying with help and assisting with wound care, pt to see Dr. Harden today 9/2, primary care provider 9/3, pt states swelling and redness have resolved 07/16/24- pt reports he saw PA at Dr. Crist office, instructed to purchase Vive wear off a specific website (not available anywhere else), pt states he is not going to purchase, states legs have no open wounds but has chronic clear weeping from legs, pt was instructed to wear Vive compression hose daily, clean with Vashe solution that he got from the hospital, pt states he is out of Vashe solution, pt has his own compression hose on hand 07/23/24- pt reports he follows up with wound care center tomorrow 07/24/24, wants to see what their assessment and plan of care will be before proceeding further about what bandages, etc he may need for his legs at home, pt states no open wounds, clear drainage only. 07/31/24- pt reports he followed up with wound care center this morning, will follow up next week on 08/08/24, legs are wrapped weekly, pt is using pneumatic compression 2 hours per [place], taking diuretic as prescribed for fluid management, pt states he does not weigh 08/08/24- pt reports followed up with wound care center this morning, will follow up again 08/15/24, reports right leg completely healed and left leg  almost healed  continues using pneumatic compression 2 hours per [place], taking diuretic as prescribed,  does not weigh, not interested  Goal:  Over the next 30 days, the patient will not experience hospital readmission  Interventions: Cellulitis Transitions of Care: Doctor Visits  - discussed the importance of doctor visits Post-op wound/incision care reviewed with patient/caregiver Reviewed Signs and symptoms of infection Evaluation of current treatment plan related to cellultis, self-management and patient's adherence to plan as established by provider. Discussed plans with patient for ongoing care management follow up and provided patient with direct contact information for care management team Evaluation of current treatment plan related to cellulitis and patient's adherence to plan as established by provider Reviewed medications with patient and discussed importance of taking as prescribed Discussed plans with patient for ongoing care management follow up and provided patient with direct contact information for care management team Reinforced importance of elevating legs, wearing compression hose as prescribed Reviewed importance of using pneumatic compression as prescribed Reviewed role of diuretic in fluid management Reviewed plan of care with pt including TOC case closure, pt in agreement and transferred care to longitudinal RN CM, order placed  Patient Self Care Activities:  Attend all scheduled provider appointments Attend church or other social activities Call pharmacy for medication refills 3-7 days in advance of running out of medications Call provider office for new concerns or questions  Notify RN Care Manager of TOC call rescheduling needs Participate in Transition of Care Program/Attend TOC scheduled calls Take medications as prescribed   Report any signs/ symptoms of infection immediately  Use pneumatic compression as prescribed Case closure from transition of care Care guide will call you to schedule appointment with nurse case manager working with primary care provider  office  Plan:  The patient has been provided with contact information for the care management team and has been advised to call with any health related questions or concerns.         Patient verbalizes understanding of instructions and care plan provided today and agrees to view in MyChart. Active MyChart status and patient understanding of how to access instructions and care plan via MyChart confirmed with patient.      Please call the care guide team at [phone] if you need to cancel or reschedule your appointment.   Please call the Suicide and Crisis Lifeline: 988 call the [place]  National Suicide Prevention Lifeline: 2761220382 or TTY: 4372946378 TTY 340-586-3145) to talk to a trained counselor call 1-800-273-TALK (toll free, 24 hour hotline) go to [facility] [address], [place] [phone]) call the [facility] Crisis Line: [phone] call 911 if you are experiencing a Mental Health or Behavioral Health Crisis or need someone to talk to.  Mliss Creed [facility], BSN RN Care Manager/ Transition of Care [place]/ [facility] [phone]

## 2024-08-09 ENCOUNTER — Telehealth: Payer: Self-pay

## 2024-08-09 NOTE — Patient Outreach (Signed)
 Complex Care Management Care Guide Note  08/09/2024 Name: [name] MRN: [id number] DOB: July 01, 1961  [name] [name] is a 63 y.o. year old male who is a primary care patient of [name], FNP and is actively engaged with the care management team. I reached out to [name] [name] by phone today to assist with re-scheduling  with the Licensed Clinical Social Worker.  Follow up plan: Unsuccessful telephone outreach attempt made. A HIPAA compliant phone message was left for the patient providing contact information and requesting a return call.  Shereen Gin [facility]  Population Health VBCI Assistant Direct Dial: [phone]  Fax: [phone] Website: [email or web address]

## 2024-08-09 NOTE — Progress Notes (Signed)
 Complex Care Management Note Care Guide Note  08/09/2024 Name: [name] [name] MRN: [id number] DOB: Sep 13, 1961   Complex Care Management Outreach Attempts: An unsuccessful telephone outreach was attempted today to offer the patient information about available complex care management services.  Follow Up Plan:  Additional outreach attempts will be made to offer the patient complex care management information and services.   Encounter Outcome:  No Answer-left voicemail  Leotis Rase [facility], [facility] Guide  Direct Dial: [phone]  Fax [phone]

## 2024-08-10 DIAGNOSIS — Z7689 Persons encountering health services in other specified circumstances: Secondary | ICD-10-CM | POA: Diagnosis not present

## 2024-08-12 ENCOUNTER — Encounter (HOSPITAL_BASED_OUTPATIENT_CLINIC_OR_DEPARTMENT_OTHER)

## 2024-08-12 ENCOUNTER — Encounter (HOSPITAL_BASED_OUTPATIENT_CLINIC_OR_DEPARTMENT_OTHER): Payer: Self-pay | Admitting: Family

## 2024-08-12 DIAGNOSIS — Z7689 Persons encountering health services in other specified circumstances: Secondary | ICD-10-CM | POA: Diagnosis not present

## 2024-08-12 NOTE — Progress Notes (Signed)
 Complex Care Management Note  Care Guide Note 08/12/2024 Name: [name] [name] MRN: [id number] DOB: Jul 31, 1961  [name] [name] is a 63 y.o. year old male who sees [name], FNP for primary care. I reached out to [name] [name] by phone today to offer complex care management services.  [name] [name] was given information about Complex Care Management services today including:   The Complex Care Management services include support from the care team which includes your Nurse Care Manager, Clinical Social Worker, or Pharmacist.  The Complex Care Management team is here to help remove barriers to the health concerns and goals most important to you. Complex Care Management services are voluntary, and the patient may decline or stop services at any time by request to their care team member.   Complex Care Management Consent Status: Patient agreed to services and verbal consent obtained.   Follow up plan:  Telephone appointment with complex care management team member scheduled for:  08/28/24 @ 11 AM  Encounter Outcome:  Patient Scheduled  Leotis Rase [facility], [facility] Guide  Direct Dial: [phone]  Fax [phone]

## 2024-08-13 DIAGNOSIS — Z7689 Persons encountering health services in other specified circumstances: Secondary | ICD-10-CM | POA: Diagnosis not present

## 2024-08-14 DIAGNOSIS — Z7689 Persons encountering health services in other specified circumstances: Secondary | ICD-10-CM | POA: Diagnosis not present

## 2024-08-15 ENCOUNTER — Ambulatory Visit (HOSPITAL_BASED_OUTPATIENT_CLINIC_OR_DEPARTMENT_OTHER): Admitting: General Surgery

## 2024-08-15 DIAGNOSIS — Z7689 Persons encountering health services in other specified circumstances: Secondary | ICD-10-CM | POA: Diagnosis not present

## 2024-08-19 ENCOUNTER — Other Ambulatory Visit: Payer: Self-pay | Admitting: Family Medicine

## 2024-08-19 DIAGNOSIS — Z7689 Persons encountering health services in other specified circumstances: Secondary | ICD-10-CM | POA: Diagnosis not present

## 2024-08-20 DIAGNOSIS — Z7689 Persons encountering health services in other specified circumstances: Secondary | ICD-10-CM | POA: Diagnosis not present

## 2024-08-21 ENCOUNTER — Encounter (INDEPENDENT_AMBULATORY_CARE_PROVIDER_SITE_OTHER): Payer: Self-pay | Admitting: Gastroenterology

## 2024-08-21 DIAGNOSIS — G4733 Obstructive sleep apnea (adult) (pediatric): Secondary | ICD-10-CM | POA: Diagnosis not present

## 2024-08-21 DIAGNOSIS — Z7689 Persons encountering health services in other specified circumstances: Secondary | ICD-10-CM | POA: Diagnosis not present

## 2024-08-22 DIAGNOSIS — Z7689 Persons encountering health services in other specified circumstances: Secondary | ICD-10-CM | POA: Diagnosis not present

## 2024-08-23 NOTE — Patient Outreach (Signed)
 Complex Care Management Care Guide Note  08/23/2024 Name: [name] [name] MRN: [id number] DOB: February 13, 1961  [name] [name] is a 63 y.o. year old male who is a primary care patient of [name], FNP and is actively engaged with the care management team. I reached out to [name] [name] by phone today to assist with re-scheduling  with the BSW.  Follow up plan: 3rd Unsuccessful telephone outreach attempt made. A HIPAA compliant phone message was left for the patient providing contact information and requesting a return call. No further outreach attempts will be made at this time.  Shereen Gin [facility]  Population Health VBCI Assistant Direct Dial: [phone]  Fax: [phone] Website: [email or web address]

## 2024-08-26 ENCOUNTER — Encounter (HOSPITAL_BASED_OUTPATIENT_CLINIC_OR_DEPARTMENT_OTHER): Payer: Self-pay

## 2024-08-26 DIAGNOSIS — Z7689 Persons encountering health services in other specified circumstances: Secondary | ICD-10-CM | POA: Diagnosis not present

## 2024-08-27 DIAGNOSIS — Z7689 Persons encountering health services in other specified circumstances: Secondary | ICD-10-CM | POA: Diagnosis not present

## 2024-08-28 ENCOUNTER — Other Ambulatory Visit: Payer: Self-pay

## 2024-08-28 NOTE — Patient Outreach (Signed)
 Complex Care Management   Visit Note  08/28/2024  Name:  [name] [name] MRN: [id number] DOB: 12/09/1960  Situation: Referral received for Complex Care Management related to Essential Hypertension, lymphedema to both lower extremities, OSA w/CPAP, COPD, left foot drop, high risk for falls, alteration in sleep pattern with excessive daytime sleepiness, toothache.  I obtained verbal consent from Patient.  Visit completed with Patient on the phone.  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   onset 02/14/14   Opioid abuse (HCC)    PE (pulmonary embolism)    DVT left leg   Positive colorectal cancer screening using Cologuard test 04/19/2019   Status post spinal surgery    Stroke [facility])    Substance abuse (HCC)    Substance use disorder 10/05/2022   Tobacco abuse     Assessment: Patient Reported Symptoms:  Cognitive Cognitive Status: Alert and oriented to person, place, and time, Normal speech and language skills, Struggling with memory recall Cognitive/Intellectual Conditions Management [RPT]: None reported or documented in medical history or problem list   Health Maintenance Behaviors: Annual physical exam, Immunizations Health Facilitated by: Rest  Neurological Neurological Review of Symptoms: Numbness, Other: (bilateral neuropathy to left foot) Oher Neurological Symptoms/Conditions [RPT]: left foot drop; daytime sleepiness despite wearing  CPAP Neurological Management Strategies: Adequate rest, Medication therapy, Routine screening Neurological Self-Management Outcome: 4 (good) Neurological Comment: patient has an upcoming appt w/Neurologist, Dr. Donita  HEENT HEENT Symptoms Reported: Mouth or teeth pain (educated patient re: how to request a list of dental priders from his health plan; mailed a letter with the information provided) HEENT Management Strategies: Routine screening HEENT Self-Management Outcome: 2 (bad)    Cardiovascular Cardiovascular Symptoms Reported: Swelling in legs or feet Does patient have uncontrolled Hypertension?: Yes (140's/90's) Is patient checking Blood Pressure at home?: Yes Patient's Recent BP reading at home: 140's/90's on average Cardiovascular Management Strategies: Routine screening, Medication therapy Cardiovascular Self-Management Outcome: 3 (uncertain) Cardiovascular Comment: Reviewed upcoming scheduled f/u with Cardiology with patient; patient states he will need to reschedule this appointment due to not having enough time to schedule his Medicaid transportation  Respiratory Respiratory Symptoms Reported: Shortness of breath Respiratory Management Strategies: Adequate rest, CPAP, Routine screening, Medication therapy Respiratory Self-Management Outcome: 3 (uncertain)  Endocrine Endocrine Symptoms Reported: No symptoms reported Is patient diabetic?: No    Gastrointestinal Gastrointestinal Symptoms Reported: Not assessed      Genitourinary Genitourinary Symptoms Reported: Not assessed    Integumentary Integumentary Symptoms Reported: Skin changes Other Integumentary Symptoms: chronic lymphedema, chronic venous insufficiency, wearing pneumatic compression twice daily x 1 hour each time, compression stockings x 12 hrs daily, diurectic for fluid management Skin Management Strategies: Medication therapy, Routine screening, Adequate rest, Medical device Skin Self-Management Outcome: 4  (good) Skin Comment: educated patient re: therapy to learn massage technique and exercises specific for lymphatic drainage  Musculoskeletal Musculoskelatal Symptoms  Reviewed: Limited mobility, Difficulty walking, Unsteady gait Additional Musculoskeletal Details: cane and walker Musculoskeletal Management Strategies: Medical device, Adequate rest, Routine screening Musculoskeletal Self-Management Outcome: 3 (uncertain) Falls in the past year?: Yes Number of falls in past year: 1 or less Was there an injury with Fall?: Yes Fall Risk Category Calculator: 2 Patient Fall Risk Level: Moderate Fall Risk Patient at Risk for Falls Due to: History of fall(s), Impaired mobility, Impaired balance/gait Fall risk Follow up: Falls evaluation completed, Education provided, Falls prevention discussed (Assessed for DME needa and home safety concerns)  Psychosocial Psychosocial Symptoms Reported: Difficulty concentrating, Alteration in sleep habits Behavioral Management Strategies: Adequate rest, Coping strategies Behavioral Health Self-Management Outcome: 3 (uncertain) Major Change/Loss/Stressor/Fears (CP): Medical condition, self Techniques to Cope with Loss/Stress/Change: Diversional activities Quality of Family Relationships: helpful, stressful Do you feel physically threatened by others?: No    08/28/2024    PHQ2-9 Depression Screening   [name] [name] interest or pleasure in doing things    Feeling down, depressed, or hopeless    PHQ-2 - Total Score    Trouble falling or staying asleep, or sleeping too much    Feeling tired or having [name] [name] energy    Poor appetite or overeating     Feeling bad about yourself - or that you are a failure or have let yourself or your family down    Trouble concentrating on things, such as reading the newspaper or watching television    Moving or speaking so slowly that other people could have noticed.  Or the opposite - being so fidgety or restless that you have been moving  around a lot more than usual    Thoughts that you would be better off dead, or hurting yourself in some way    PHQ2-9 Total Score    If you checked off any problems, how difficult have these problems made it for you to do your work, take care of things at home, or get along with other people    Depression Interventions/Treatment      There were no vitals filed for this visit.  Medications Reviewed Today     Reviewed by Morgan Clayborne CROME, RN (Registered Nurse) on 08/28/24 at 1153  Med List Status: <None>   Medication Order Taking? Sig Documenting Provider Last Dose Status Informant  albuterol  (VENTOLIN  HFA) 108 (90 Base) MCG/ACT inhaler [id number] Yes Inhale 2 puffs into the lungs every 6 (six) hours as needed for wheezing or shortness of breath. [name], FNP  Active Self, Pharmacy Records  cefadroxil  (DURICEF) 500 MG capsule [id number]  Take 500 mg by mouth 2 (two) times daily. [provider]  Consider Medication Status and Discontinue (Completed Course)   DULoxetine  (CYMBALTA ) 30 MG capsule [id number] Yes Take 1 capsule (30 mg total) by mouth daily. Aletha Bene, MD  Active   furosemide  (LASIX ) 40 MG tablet [id number] Yes Take 1 tablet (40 mg total) by mouth in the morning. Aletha Bene, MD  Active   gabapentin  (NEURONTIN ) 100 MG capsule [id number] Yes Take 2 capsules (200 mg total) by mouth 3 (three) times daily. Aletha Bene, MD  Active   loratadine  (CLARITIN ) 10 MG tablet [id number] Yes Take 1 tablet (10 mg total) by mouth daily. Aletha Bene, MD  Active   methadone  (DOLOPHINE ) 10 MG/ML solution [id number] Yes Take 135 mg by mouth daily. [provider]  Active Self, Pharmacy Records           Med Note CARLEEN, [place] D   Mon Jul 01, 2024 12:48 PM) Verified dosage at [facility] -- 135mg  daily  potassium chloride  SA (KLOR-CON  M) 20 MEQ tablet [id number] Yes Take 1 tablet (20 mEq total) by mouth daily. Aletha Bene, MD  Active   rivaroxaban   (XARELTO ) 20 MG TABS tablet [id number] Yes Take 1 tablet (20 mg total) by mouth daily with supper. Aletha Bene, MD  Active   Med List Note Carleen Georgiann BIRCH, CPhT 07/01/24 1248): [facility] Last dose - 07/01/24 135mg             Recommendation:   Specialty provider follow-up on 08/29/24 at 1:55 PM with   HTN [2380] Copay: $4.00   Provider: Vannie Reche RAMAN, NP Department: [facility]  If you cannot keep this appointment, please call the office to reschedule as soon as possible Call your health plan to obtain a list of in-network dental providers so you can schedule a new patient appointment for evaluation of dental pain  Review the Community Resources link via your mychart as discussed   Follow Up Plan:   Telephone follow up appointment date/time:  Tuesday, November 11 at 10:45 AM Referral to Care Guide  Clayborne Ly RN BSN CCM [facility]  [facility], [facility] Nurse Care Coordinator  Direct Dial: [phone] Website: [email or web address]

## 2024-08-28 NOTE — Patient Instructions (Addendum)
 Visit Information  Thank you for taking time to visit with me today. Please don't hesitate to contact me if I can be of assistance to you before our next scheduled appointment.  Our next appointment is by telephone on Tuesday, November 11 at 10:45 AM Please call the care guide team at [phone] if you need to cancel or reschedule your appointment.   Following is a copy of your care plan:   Goals Addressed             This Visit's Progress    VBCI RN Care Plan related to bilateral lymphedema to lower extremities       Problems:  Chronic Disease Management support and education needs related to bilateral lymphedema to lower extremities   Goal: Over the next 90 days the Patient will continue to work with RN Care Manager and/or Social Worker to address care management and care coordination needs related to bilateral lymphedema to lower extremities as evidenced by adherence to care management team scheduled appointments      Interventions:   Evaluation of current treatment plan related to bilateral lymphedema to lower extremities, self-management and patient's adherence to plan as established by provider Reviewed and discussed patient's current treatment plan related to bilateral lymphedema to lower extremities Determined patient is currently wearing leg pumps twice daily x 1 hour each time applied, he is wearing compression stockings x 12 hours daily, applying moisturizer to help keep skin from cracking  Determined patient has not worked with a PT for lymphedema therapy in the past  Educated patient about the benefits of learning exercise and massage techniques to help manage lymphatic drainage  Sent in basket message to PCP provider requesting a referral for lymphedema therapy  Discussed plans with patient for ongoing care management follow up and provided patient with direct contact information for care management team   Patient Self-Care Activities:  Attend all scheduled provider  appointments Call pharmacy for medication refills 3-7 days in advance of running out of medications Call provider office for new concerns or questions  Take medications as prescribed   Work with the nurse care manager to address care coordination needs and will continue to work with the clinical team to address health care and disease management related needs  Plan:  Telephone follow up appointment with care management team member scheduled for:  Tuesday, November 11 at 10:45 AM         Please call 1-800-273-TALK (toll free, 24 hour hotline) if you are experiencing a Mental Health or Behavioral Health Crisis or need someone to talk to.  Patient verbalizes understanding of instructions and care plan provided today and agrees to view in MyChart. Active MyChart status and patient understanding of how to access instructions and care plan via MyChart confirmed with patient.     Clayborne Ly RN BSN CCM [place]  [facility], [facility] Nurse Care Coordinator  Direct Dial: [phone] Website: [email or web address]

## 2024-08-29 ENCOUNTER — Encounter (HOSPITAL_BASED_OUTPATIENT_CLINIC_OR_DEPARTMENT_OTHER): Admitting: Family

## 2024-08-29 DIAGNOSIS — Z7689 Persons encountering health services in other specified circumstances: Secondary | ICD-10-CM | POA: Diagnosis not present

## 2024-08-31 DIAGNOSIS — Z7689 Persons encountering health services in other specified circumstances: Secondary | ICD-10-CM | POA: Diagnosis not present

## 2024-09-02 DIAGNOSIS — Z7689 Persons encountering health services in other specified circumstances: Secondary | ICD-10-CM | POA: Diagnosis not present

## 2024-09-03 DIAGNOSIS — Z7689 Persons encountering health services in other specified circumstances: Secondary | ICD-10-CM | POA: Diagnosis not present

## 2024-09-04 DIAGNOSIS — Z7689 Persons encountering health services in other specified circumstances: Secondary | ICD-10-CM | POA: Diagnosis not present

## 2024-09-05 DIAGNOSIS — Z7689 Persons encountering health services in other specified circumstances: Secondary | ICD-10-CM | POA: Diagnosis not present

## 2024-09-06 DIAGNOSIS — Z7689 Persons encountering health services in other specified circumstances: Secondary | ICD-10-CM | POA: Diagnosis not present

## 2024-09-07 DIAGNOSIS — G4733 Obstructive sleep apnea (adult) (pediatric): Secondary | ICD-10-CM | POA: Diagnosis not present

## 2024-09-09 ENCOUNTER — Encounter: Payer: Self-pay | Admitting: Radiology

## 2024-09-09 DIAGNOSIS — Z7689 Persons encountering health services in other specified circumstances: Secondary | ICD-10-CM | POA: Diagnosis not present

## 2024-09-10 DIAGNOSIS — Z7689 Persons encountering health services in other specified circumstances: Secondary | ICD-10-CM | POA: Diagnosis not present

## 2024-09-11 ENCOUNTER — Encounter (INDEPENDENT_AMBULATORY_CARE_PROVIDER_SITE_OTHER): Payer: Self-pay | Admitting: *Deleted

## 2024-09-11 ENCOUNTER — Telehealth: Payer: Self-pay | Admitting: Cardiology

## 2024-09-11 DIAGNOSIS — Z7689 Persons encountering health services in other specified circumstances: Secondary | ICD-10-CM | POA: Diagnosis not present

## 2024-09-11 NOTE — Telephone Encounter (Signed)
 Pt needs f/u appt for CPAP before 11/13 to stay in compliance with insurance. Please advise.

## 2024-09-12 ENCOUNTER — Ambulatory Visit: Payer: Self-pay | Admitting: Cardiology

## 2024-09-12 DIAGNOSIS — R400 Somnolence: Secondary | ICD-10-CM

## 2024-09-12 DIAGNOSIS — I1A0 Resistant hypertension: Secondary | ICD-10-CM

## 2024-09-12 DIAGNOSIS — Z7689 Persons encountering health services in other specified circumstances: Secondary | ICD-10-CM | POA: Diagnosis not present

## 2024-09-12 DIAGNOSIS — G894 Chronic pain syndrome: Secondary | ICD-10-CM

## 2024-09-12 DIAGNOSIS — G4733 Obstructive sleep apnea (adult) (pediatric): Secondary | ICD-10-CM

## 2024-09-12 DIAGNOSIS — I10 Essential (primary) hypertension: Secondary | ICD-10-CM

## 2024-09-13 DIAGNOSIS — Z7689 Persons encountering health services in other specified circumstances: Secondary | ICD-10-CM | POA: Diagnosis not present

## 2024-09-14 DIAGNOSIS — Z7689 Persons encountering health services in other specified circumstances: Secondary | ICD-10-CM | POA: Diagnosis not present

## 2024-09-16 DIAGNOSIS — Z7689 Persons encountering health services in other specified circumstances: Secondary | ICD-10-CM | POA: Diagnosis not present

## 2024-09-17 ENCOUNTER — Other Ambulatory Visit: Payer: Self-pay

## 2024-09-17 ENCOUNTER — Other Ambulatory Visit: Payer: Self-pay | Admitting: Family Medicine

## 2024-09-17 DIAGNOSIS — I5032 Chronic diastolic (congestive) heart failure: Secondary | ICD-10-CM

## 2024-09-17 DIAGNOSIS — G894 Chronic pain syndrome: Secondary | ICD-10-CM

## 2024-09-17 DIAGNOSIS — R600 Localized edema: Secondary | ICD-10-CM

## 2024-09-17 DIAGNOSIS — J302 Other seasonal allergic rhinitis: Secondary | ICD-10-CM

## 2024-09-17 DIAGNOSIS — I890 Lymphedema, not elsewhere classified: Secondary | ICD-10-CM

## 2024-09-17 DIAGNOSIS — I10 Essential (primary) hypertension: Secondary | ICD-10-CM

## 2024-09-17 DIAGNOSIS — Z7901 Long term (current) use of anticoagulants: Secondary | ICD-10-CM

## 2024-09-17 DIAGNOSIS — F32A Depression, unspecified: Secondary | ICD-10-CM

## 2024-09-17 DIAGNOSIS — Z7689 Persons encountering health services in other specified circumstances: Secondary | ICD-10-CM | POA: Diagnosis not present

## 2024-09-17 NOTE — Patient Instructions (Signed)
 Visit Information  Thank you for taking time to visit with me today. Please don't hesitate to contact me if I can be of assistance to you before our next scheduled appointment.  Your next care management appointment is by telephone on Friday, December 5 at 10:30 AM  Please call the care guide team at [phone] if you need to cancel, schedule, or reschedule an appointment.   Please call 1-800-273-TALK (toll free, 24 hour hotline) if you are experiencing a Mental Health or Behavioral Health Crisis or need someone to talk to.  Clayborne Ly RN BSN CCM [place]  [facility], [facility] Nurse Care Coordinator  Direct Dial: [phone] Website: [email or web address]

## 2024-09-17 NOTE — Patient Outreach (Unsigned)
 Complex Care Management   Visit Note  09/17/2024  Name:  [name] [name] MRN: [id number] DOB: 1961/05/27  Situation: Referral received for Complex Care Management related to  Essential Hypertension, lymphedema to both lower extremities, OSA w/CPAP, COPD, left foot drop, high risk for falls, alteration in sleep pattern with excessive daytime sleepiness, toothache. I obtained verbal consent from Patient.  Visit completed with Patient on the phone.  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   onset 02/14/14   Opioid abuse (HCC)    PE (pulmonary embolism)    DVT left leg   Positive colorectal cancer screening using Cologuard test 04/19/2019   Status post spinal surgery    Stroke [facility])    Substance abuse (HCC)    Substance use disorder 10/05/2022   Tobacco abuse     Assessment: Patient Reported Symptoms:  Cognitive Cognitive Status: Alert and oriented to person, place, and time, Normal speech and language skills Cognitive/Intellectual Conditions Management [RPT]: None reported or documented in medical history or problem list   Health Maintenance Behaviors: Annual physical exam Health Facilitated by: Rest  Neurological Neurological Review of Symptoms: Numbness, Other: Neurological Management Strategies: Adequate rest, Medication therapy, Routine screening Neurological Self-Management Outcome: 3 (uncertain)  HEENT HEENT Symptoms Reported: Not assessed       Cardiovascular Cardiovascular Symptoms Reported: Swelling in legs or feet Does patient have uncontrolled Hypertension?: Yes Is patient checking Blood Pressure at home?: Yes Cardiovascular Management Strategies: Medication therapy, Routine screening, Adequate rest Cardiovascular Self-Management Outcome: 3 (uncertain)  Respiratory Respiratory Symptoms Reported: Shortness of breath, Other: Other Respiratory Symptoms: Sleep disorder Additional Respiratory Details: Informed patient of Dr. Dorine recommendation re: AHI is too high - needs to go to the sleep lab for CPAP titration. He may need BiPAP Respiratory Management Strategies: Routine screening, Adequate rest, CPAP, Medication therapy Respiratory Self-Management Outcome: 3 (uncertain)  Endocrine Endocrine Symptoms Reported: No symptoms reported Is patient diabetic?: No    Gastrointestinal Gastrointestinal Symptoms Reported: No symptoms reported      Genitourinary Genitourinary Symptoms Reported: No symptoms reported    Integumentary Integumentary Symptoms Reported: Skin changes, Wound Other Integumentary Symptoms: chronic lymphedema, chronic venous insufficiency, wearing pneumatic compression twice daily x 1 hour each time, compression stockings x 12 hrs daily, diurectic for fluid management Additional Integumentary Details: patient reports having new small bumps on his lower extremities, bilaterally; Reviewed and discussed upcoming scheduled f/u with wound care scheduled for 09/19/24 Skin Management Strategies: Medication therapy, Routine screening, Adequate rest Skin Self-Management Outcome: 3 (uncertain)  Musculoskeletal Musculoskelatal Symptoms Reviewed: Difficulty walking, Unsteady gait, Limited mobility, Back pain, Weakness Additional Musculoskeletal Details: chronic back pain; hx of Cellulitis and lymphedema to both lower extremities Musculoskeletal Management Strategies: Medication therapy, Medical device, Routine  screening, Adequate rest Musculoskeletal Self-Management Outcome: 3 (uncertain) Falls in the past year?: Yes Number of falls in past year: 1 or less  Was there an injury with Fall?: Yes Fall Risk Category Calculator: 2 Patient Fall Risk Level: Moderate Fall Risk Patient at Risk for Falls Due to: Impaired mobility, Impaired balance/gait Fall risk Follow up: Falls evaluation completed, Education provided  Psychosocial Psychosocial Symptoms Reported: Alteration in sleep habits, Difficulty concentrating Additional Psychological Details: generalized anxiety disorder Behavioral Management Strategies: Adequate rest, Coping strategies Behavioral Health Self-Management Outcome: 4 (good) Major Change/Loss/Stressor/Fears (CP): Medical condition, self Techniques to Cope with Loss/Stress/Change: Diversional activities Quality of Family Relationships: stressful, helpful Do you feel physically threatened by others?: No    09/17/2024    PHQ2-9 Depression Screening   Tonna Palazzi interest or pleasure in doing things    Feeling down, depressed, or hopeless    PHQ-2 - Total Score    Trouble falling or staying asleep, or sleeping too much    Feeling tired or having [place] energy    Poor appetite or overeating     Feeling bad about yourself - or that you are a failure or have let yourself or your family down    Trouble concentrating on things, such as reading the newspaper or watching television    Moving or speaking so slowly that other people could have noticed.  Or the opposite - being so fidgety or restless that you have been moving around a lot more than usual    Thoughts that you would be better off dead, or hurting yourself in some way    PHQ2-9 Total Score    If you checked off any problems, how difficult have these problems made it for you to do your work, take care of things at home, or get along with other people    Depression Interventions/Treatment      There were no vitals filed for this  visit. Pain Scale: 0-10 Pain Score: 8  Pain Type: Neuropathic pain, Chronic pain Pain Location: Other (Comment) (Patient states he has chronic pain all over his body) Pain Descriptors / Indicators: Aching, Discomfort, Numbness Pain Onset: On-going Pain Intervention(s): Medication (See eMAR), Rest  Medications Reviewed Today     Reviewed by Morgan Clayborne CROME, RN (Registered Nurse) on 09/17/24 at 1537  Med List Status: <None>   Medication Order Taking? Sig Documenting Provider Last Dose Status Informant  albuterol  (VENTOLIN  HFA) 108 (90 Base) MCG/ACT inhaler [id number] Yes Inhale 2 puffs into the lungs every 6 (six) hours as needed for wheezing or shortness of breath. [name], FNP  Active Self, Pharmacy Records  cefadroxil  (DURICEF) 500 MG capsule [id number]  Take 500 mg by mouth 2 (two) times daily. [provider]  Consider Medication Status and Discontinue (Completed Course)   DULoxetine  (CYMBALTA ) 30 MG capsule [id number] Yes Take 1 capsule (30 mg total) by mouth daily. Aletha Bene, MD  Active   furosemide  (LASIX ) 40 MG tablet [id number] Yes Take 1 tablet (40 mg total) by mouth in the morning. Aletha Bene, MD  Active   gabapentin  (NEURONTIN ) 100 MG capsule [id number] Yes Take 2 capsules (200 mg total) by mouth 3 (three) times daily. Aletha Bene, MD  Active   loratadine  (CLARITIN ) 10 MG tablet [id number] Yes Take 1 tablet (10 mg total) by mouth daily. Aletha Bene, MD  Active   methadone  (DOLOPHINE ) 10 MG/ML solution [id number] Yes Take 135 mg by mouth daily. [provider]  Active Self, Pharmacy Records           Med Note [name] Pablo Jul 01, 2024 12:48 PM) Verified dosage at [facility] Ve[facility] -- 135mg  daily  Multiple Vitamins-Minerals (ONE-A-Veloso PROACTIVE 65+) TABS [id number] Yes Take 2 tablets by mouth daily. [provider]  Active   potassium chloride  SA (KLOR-CON  M) 20 MEQ tablet [id number]  Take 1 tablet (20 mEq total) by  mouth daily.  Patient not taking: Reported on 09/17/2024   Aletha Bene, MD  Expired 08/28/24 2359   rivaroxaban  (XARELTO ) 20 MG TABS tablet [id number] Yes Take 1 tablet (20 mg total) by mouth daily with supper. Aletha Bene, MD  Active   Med List Note Carleen Georgiann BIRCH, CPhT 07/01/24 1248): [facility]) Last dose - 07/01/24 135mg             Recommendation:   Specialty provider follow-up    09/19/2024 Status: Sch   Time: 8:15 AM Length: 45  Visit Type: WOUND CARE 45 [1831] Copay: $0.00  Provider: Marolyn Nest, MD Department: [facility]   Follow Up Plan:   Telephone follow up appointment with social worker, Tobias Moose BSW on date/time:  Friday, November 14 at 1015 AM Telephone follow up appointment with nurse Care Manager RN, Clayborne Ly on Friday, November 12 at 10:30 AM Referral sent to [facility] Brittannie Tawney RN BSN CCM [facility]  [facility], [facility] Nurse Care Coordinator  Direct Dial: [phone] Website: [email or web address]

## 2024-09-17 NOTE — Telephone Encounter (Signed)
 Copied from CRM #[id number]. Topic: Clinical - Medication Refill >> Sep 17, 2024 11:58 AM Tinnie C wrote: Medication:  gabapentin  (NEURONTIN ) 100 MG capsule rivaroxaban  (XARELTO ) 20 MG TABS tablet furosemide  (LASIX ) 40 MG tablet loratadine  (CLARITIN ) 10 MG tablet DULoxetine  (CYMBALTA ) 30 MG capsule  Has the patient contacted their pharmacy? No (Agent: If no, request that the patient contact the pharmacy for the refill. If patient does not wish to contact the pharmacy document the reason why and proceed with request.) (Agent: If yes, when and what did the pharmacy advise?)  This is the patient's preferred pharmacy:  [facility] - [place], [place] - [address] [place] [address] Phone: [phone] Fax: [phone]  [facility] [address] [place] [address] Phone: [phone] Fax: [phone]  Is this the correct pharmacy for this prescription? Yes If no, delete pharmacy and type the correct one.   Has the prescription been filled recently? Yes  Is the patient out of the medication? No, 2-3 days left on all.  Has the patient been seen for an appointment in the last year OR does the patient have an upcoming appointment? Yes  Can we respond through MyChart? Please call [phone]  Agent: Please be advised that Rx refills may take up to 3 business days. We ask that you follow-up with your pharmacy.

## 2024-09-17 NOTE — Telephone Encounter (Signed)
 Copied from CRM [id number]. Topic: Clinical - Medication Refill >> Sep 17, 2024 11:55 AM Tinnie C wrote: Medication: albuterol  (VENTOLIN  HFA) 108 (90 Base) MCG/ACT inhaler  Has the patient contacted their pharmacy? Yes Out of refills  This is the patient's preferred pharmacy:  [facility] - [place], [place] - [address] [place] [address] Phone: [phone] Fax: [phone]  Is this the correct pharmacy for this prescription? Yes If no, delete pharmacy and type the correct one.   Has the prescription been filled recently? Yes  Is the patient out of the medication? No, enough for a couple days  Has the patient been seen for an appointment in the last year OR does the patient have an upcoming appointment? Yes  Can we respond through MyChart? Please call [phone]  Agent: Please be advised that Rx refills may take up to 3 business days. We ask that you follow-up with your pharmacy.

## 2024-09-18 ENCOUNTER — Telehealth: Payer: Self-pay | Admitting: Family Medicine

## 2024-09-18 DIAGNOSIS — Z419 Encounter for procedure for purposes other than remedying health state, unspecified: Secondary | ICD-10-CM | POA: Diagnosis not present

## 2024-09-18 DIAGNOSIS — Z7689 Persons encountering health services in other specified circumstances: Secondary | ICD-10-CM | POA: Diagnosis not present

## 2024-09-18 NOTE — Telephone Encounter (Signed)
 Copied from CRM [id number]. Topic: Clinical - Prescription Issue >> Sep 18, 2024  2:51 PM Emylou G wrote: Reason for CRM: Patient called checking status of CPAP possible to change to ByPAP - are we doing a sleep study appt?  There is confusion there.  Pls call review w/patient

## 2024-09-19 ENCOUNTER — Encounter (HOSPITAL_BASED_OUTPATIENT_CLINIC_OR_DEPARTMENT_OTHER): Attending: General Surgery | Admitting: General Surgery

## 2024-09-19 DIAGNOSIS — I87333 Chronic venous hypertension (idiopathic) with ulcer and inflammation of bilateral lower extremity: Secondary | ICD-10-CM | POA: Diagnosis not present

## 2024-09-19 DIAGNOSIS — F17210 Nicotine dependence, cigarettes, uncomplicated: Secondary | ICD-10-CM | POA: Insufficient documentation

## 2024-09-19 DIAGNOSIS — I5030 Unspecified diastolic (congestive) heart failure: Secondary | ICD-10-CM | POA: Insufficient documentation

## 2024-09-19 DIAGNOSIS — I890 Lymphedema, not elsewhere classified: Secondary | ICD-10-CM | POA: Diagnosis not present

## 2024-09-19 DIAGNOSIS — Z7689 Persons encountering health services in other specified circumstances: Secondary | ICD-10-CM | POA: Diagnosis not present

## 2024-09-19 DIAGNOSIS — L97822 Non-pressure chronic ulcer of other part of left lower leg with fat layer exposed: Secondary | ICD-10-CM | POA: Insufficient documentation

## 2024-09-19 DIAGNOSIS — L97812 Non-pressure chronic ulcer of other part of right lower leg with fat layer exposed: Secondary | ICD-10-CM | POA: Insufficient documentation

## 2024-09-19 NOTE — Telephone Encounter (Signed)
 Requested Prescriptions  Pending Prescriptions Disp Refills   gabapentin  (NEURONTIN ) 100 MG capsule 180 capsule 0    Sig: Take 2 capsules (200 mg total) by mouth 3 (three) times daily.     Neurology: Anticonvulsants - gabapentin  Passed - 09/19/2024  1:10 PM      Passed - Cr in normal range and within 360 days    Creat  Date Value Ref Range Status  07/19/2024 0.93 0.70 - 1.35 mg/dL Final   Creatinine,U  Date Value Ref Range Status  03/24/2016 6.10 mg/dL Final    Comment:      Effective February 29, 2016, [facility] [employer] will offer a new compendium for Prescription Drug Monitoring (PDM) and Drugs of Abuse Panel order codes. All [employer] toxicology codes will be inactivated Apr 21, 2016. Please contact your [facility] Account Representative directly, or call our Customer Service Department at [phone] for new testing options.      Creatinine, Urine  Date Value Ref Range Status  02/14/2014 135.62 mg/dL Final         Passed - Completed PHQ-2 or PHQ-9 in the last 360 days      Passed - Valid encounter within last 12 months    Recent Outpatient Visits           1 month ago Bilateral lower leg cellulitis   [place] [facility] [name], FNP   2 months ago Bilateral lower leg cellulitis   [place] [facility] Aletha Bene, [facility]   4 months ago Excessive daytime sleepiness   [place] [facility] [name], FNP   6 months ago Hypertension, unspecified type   [facility] Medicine [name], FNP   6 months ago Physical exam, annual   [place] [facility] Kayla, Jeoffrey RAMAN, FNP               furosemide  (LASIX ) 40 MG tablet 30 tablet 0    Sig: Take 1 tablet (40 mg total) by mouth in the morning.     Cardiovascular:  Diuretics - Loop Failed - 09/19/2024  1:10 PM      Failed - Last BP in normal range    BP Readings from Last 1  Encounters:  07/22/24 (!) 151/84         Passed - K in normal range and within 180 days    Potassium  Date Value Ref Range Status  07/19/2024 3.9 3.5 - 5.3 mmol/L Final         Passed - Ca in normal range and within 180 days    Calcium   Date Value Ref Range Status  07/19/2024 8.8 8.6 - 10.3 mg/dL Final   Calcium , Ion  Date Value Ref Range Status  07/11/2023 1.12 (L) 1.15 - 1.40 mmol/L Final         Passed - Na in normal range and within 180 days    Sodium  Date Value Ref Range Status  07/19/2024 138 135 - 146 mmol/L Final  03/27/2024 138 134 - 144 mmol/L Final         Passed - Cr in normal range and within 180 days    Creat  Date Value Ref Range Status  07/19/2024 0.93 0.70 - 1.35 mg/dL Final   Creatinine,U  Date Value Ref Range Status  03/24/2016 6.10 mg/dL Final    Comment:      Effective February 29, 2016,  [facility] [employer] will offer a new compendium for Prescription Drug Monitoring (PDM) and Drugs of Abuse Panel order codes. All [employer] toxicology codes will be inactivated 04/04/2016. Please contact your [facility] Account Representative directly, or call our Customer Service Department at [phone] for new testing options.      Creatinine, Urine  Date Value Ref Range Status  02/14/2014 135.62 mg/dL Final         Passed - Cl in normal range and within 180 days    Chloride  Date Value Ref Range Status  07/19/2024 104 98 - 110 mmol/L Final         Passed - Mg Level in normal range and within 180 days    Magnesium   Date Value Ref Range Status  07/19/2024 2.0 1.5 - 2.5 mg/dL Final         Passed - Valid encounter within last 6 months    Recent Outpatient Visits           1 month ago Bilateral lower leg cellulitis   [place] [facility] Medicine [name], FNP   2 months ago Bilateral lower leg cellulitis   [place] [facility] Medicine Aletha Bene, MD   4 months ago Excessive  daytime sleepiness   [place] [facility] [name], FNP   6 months ago Hypertension, unspecified type   [place] [facility] Medicine [name], FNP   6 months ago Physical exam, annual   [place] [facility] Kayla, Jeoffrey RAMAN, FNP               DULoxetine  (CYMBALTA ) 30 MG capsule 30 capsule 0    Sig: Take 1 capsule (30 mg total) by mouth daily.     Psychiatry: Antidepressants - SNRI - duloxetine  Failed - 09/19/2024  1:10 PM      Failed - Last BP in normal range    BP Readings from Last 1 Encounters:  07/22/24 (!) 151/84         Passed - Cr in normal range and within 360 days    Creat  Date Value Ref Range Status  07/19/2024 0.93 0.70 - 1.35 mg/dL Final   Creatinine,U  Date Value Ref Range Status  03/24/2016 6.10 mg/dL Final    Comment:      Effective February 29, 2016, [facility] [employer] will offer a new compendium for Prescription Drug Monitoring (PDM) and Drugs of Abuse Panel order codes. All [employer] toxicology codes will be inactivated 04/04/2016. Please contact your [facility] Account Representative directly, or call our Customer Service Department at [phone] for new testing options.      Creatinine, Urine  Date Value Ref Range Status  02/14/2014 135.62 mg/dL Final         Passed - eGFR is 30 or above and within 360 days    GFR, Est African American  Date Value Ref Range Status  08/21/2015 >89 >=60 mL/min Final   GFR calc Af Amer  Date Value Ref Range Status  06/04/2020 >60 >60 mL/min Final   GFR, Est Non African American  Date Value Ref Range Status  08/21/2015 >89 >=60 mL/min Final    Comment:      The estimated GFR is a calculation valid for adults (>=52 years old) that uses the CKD-EPI algorithm to adjust for age and sex. It is   not to be used for  children, pregnant women, hospitalized patients,    patients on dialysis, or with rapidly changing  kidney function. According to the NKDEP, eGFR >89 is normal, 60-89 shows mild impairment, 30-59 shows moderate impairment, 15-29 shows severe impairment and <15 is ESRD.      GFR, Estimated  Date Value Ref Range Status  07/06/2024 >60 >60 mL/min Final    Comment:    (NOTE) Calculated using the CKD-EPI Creatinine Equation (2021)    eGFR  Date Value Ref Range Status  07/19/2024 93 > OR = 60 mL/min/1.67m2 Final  03/27/2024 93 >59 mL/min/1.73 Final         Passed - Completed PHQ-2 or PHQ-9 in the last 360 days      Passed - Valid encounter within last 6 months    Recent Outpatient Visits           1 month ago Bilateral lower leg cellulitis   [place] [facility] [name], FNP   2 months ago Bilateral lower leg cellulitis   [place] [facility] Medicine Aletha Bene, MD   4 months ago Excessive daytime sleepiness   [place] [facility] Medicine [name], FNP   6 months ago Hypertension, unspecified type   [place] [facility] [name], FNP   6 months ago Physical exam, annual    [facility] [name], FNP              Refused Prescriptions Disp Refills   rivaroxaban  (XARELTO ) 20 MG TABS tablet 90 tablet 0    Sig: Take 1 tablet (20 mg total) by mouth daily with supper.     Hematology: Anticoagulants - rivaroxaban  Failed - 09/19/2024  1:10 PM      Failed - HCT in normal range and within 360 days    HCT  Date Value Ref Range Status  07/19/2024 36.4 (L) 38.5 - 50.0 % Final         Failed - HGB in normal range and within 360 days    Hemoglobin  Date Value Ref Range Status  07/19/2024 11.9 (L) 13.2 - 17.1 g/dL Final         Passed - ALT in normal range and within 360 days    ALT  Date Value Ref Range Status  07/19/2024 9 9 - 46 U/L Final         Passed - AST in normal range and within 360 days    AST  Date Value Ref Range Status   07/19/2024 12 10 - 35 U/L Final         Passed - Cr in normal range and within 360 days    Creat  Date Value Ref Range Status  07/19/2024 0.93 0.70 - 1.35 mg/dL Final   Creatinine,U  Date Value Ref Range Status  03/24/2016 6.10 mg/dL Final    Comment:      Effective February 29, 2016, [facility] [employer] will offer a new compendium for Prescription Drug Monitoring (PDM) and Drugs of Abuse Panel order codes. All [employer] toxicology codes will be inactivated Apr 02, 2016. Please contact your [facility] Account Representative directly, or call our Customer Service Department at [phone] for new testing options.      Creatinine, Urine  Date Value Ref Range Status  02/14/2014 135.62 mg/dL Final         Passed - PLT in normal range and within 360 days  Platelets  Date Value Ref Range Status  07/19/2024 285 140 - 400 Thousand/uL Final         Passed - eGFR is 15 or above and within 360 days    GFR, Est African American  Date Value Ref Range Status  08/21/2015 >89 >=60 mL/min Final   GFR calc Af Amer  Date Value Ref Range Status  06/04/2020 >60 >60 mL/min Final   GFR, Est Non African American  Date Value Ref Range Status  08/21/2015 >89 >=60 mL/min Final    Comment:      The estimated GFR is a calculation valid for adults (>=17 years old) that uses the CKD-EPI algorithm to adjust for age and sex. It is   not to be used for children, pregnant women, hospitalized patients,    patients on dialysis, or with rapidly changing kidney function. According to the NKDEP, eGFR >89 is normal, 60-89 shows mild impairment, 30-59 shows moderate impairment, 15-29 shows severe impairment and <15 is ESRD.      GFR, Estimated  Date Value Ref Range Status  07/06/2024 >60 >60 mL/min Final    Comment:    (NOTE) Calculated using the CKD-EPI Creatinine Equation (2021)    eGFR  Date Value Ref Range Status  07/19/2024 93 > OR = 60 mL/min/1.70m2 Final   03/27/2024 93 >59 mL/min/1.73 Final         Passed - Patient is not pregnant      Passed - Valid encounter within last 12 months    Recent Outpatient Visits           1 month ago Bilateral lower leg cellulitis   [place] [facility] Medicine [name], FNP   2 months ago Bilateral lower leg cellulitis   [place] [facility] Aletha Bene, MD   4 months ago Excessive daytime sleepiness   [place] [facility] [name], FNP   6 months ago Hypertension, unspecified type   [place] [facility] Medicine [name], FNP   6 months ago Physical exam, annual   [place] [facility] Kayla, Jeoffrey RAMAN, FNP               loratadine  (CLARITIN ) 10 MG tablet 90 tablet 0    Sig: Take 1 tablet (10 mg total) by mouth daily.     Ear, Nose, and Throat:  Antihistamines 2 Passed - 09/19/2024  1:10 PM      Passed - Cr in normal range and within 360 days    Creat  Date Value Ref Range Status  07/19/2024 0.93 0.70 - 1.35 mg/dL Final   Creatinine,U  Date Value Ref Range Status  03/24/2016 6.10 mg/dL Final    Comment:      Effective February 29, 2016, [facility] [employer] will offer a new compendium for Prescription Drug Monitoring (PDM) and Drugs of Abuse Panel order codes. All [employer] toxicology codes will be inactivated 04/22/2016. Please contact your [facility] Account Representative directly, or call our Customer Service Department at [phone] for new testing options.      Creatinine, Urine  Date Value Ref Range Status  02/14/2014 135.62 mg/dL Final         Passed - Valid encounter within last 12 months    Recent Outpatient Visits           1 month ago Bilateral lower leg cellulitis    [place]  [facility] [name], FNP   2 months ago Bilateral lower leg cellulitis   [place] [facility] Medicine Aletha Bene, MD   4 months ago Excessive daytime sleepiness   [place] [facility] [name], FNP   6 months ago Hypertension, unspecified type   [place] [facility] [name], FNP   6 months ago Physical exam, annual   [place] [facility] [name], FNP

## 2024-09-19 NOTE — Telephone Encounter (Signed)
 needs to go to the sleep lab for CPAP titration. He may need BiPAP.  Cpap titration ordered and will precert.

## 2024-09-19 NOTE — Telephone Encounter (Signed)
 The patient has been notified of the result and verbalized understanding.  All questions (if any) were answered.   Prior Authorization for CPAP TITRATION sent to [facility] via web portal. Tracking Number . Case Number:[id number] Review Date: 09/19/2024 2:04:34 PM Expiration Date: N/A Status:

## 2024-09-19 NOTE — Telephone Encounter (Signed)
 Requested medications are due for refill today.  unsure  Requested medications are on the active medications list.  yes  Last refill. 03/20/2024 8g 0 rf  Future visit scheduled.   no  Notes to clinic.  Medication has only been ordered 1 time. Please review for refill.    Requested Prescriptions  Pending Prescriptions Disp Refills   albuterol  (VENTOLIN  HFA) 108 (90 Base) MCG/ACT inhaler 8 g 0    Sig: Inhale 2 puffs into the lungs every 6 (six) hours as needed for wheezing or shortness of breath.     Pulmonology:  Beta Agonists 2 Failed - 09/19/2024 12:14 PM      Failed - Last BP in normal range    BP Readings from Last 1 Encounters:  07/22/24 (!) 151/84         Passed - Last Heart Rate in normal range    Pulse Readings from Last 1 Encounters:  07/22/24 68         Passed - Valid encounter within last 12 months    Recent Outpatient Visits           1 month ago Bilateral lower leg cellulitis   [facility] [name], FNP   2 months ago Bilateral lower leg cellulitis   [place] [facility] Medicine Aletha Bene, MD   4 months ago Excessive daytime sleepiness   [place] [facility] [name], FNP   6 months ago Hypertension, unspecified type   [place] [facility] [name], FNP   6 months ago Physical exam, annual   [place] [facility] [name], [place]

## 2024-09-19 NOTE — Telephone Encounter (Signed)
 Pt's nurse calling back for an update on this request. Please advise if this can be ordered.

## 2024-09-19 NOTE — Telephone Encounter (Signed)
 Requested medication (s) are due for refill today: Yes  Requested medication (s) are on the active medication list: Yes  Last refill:    Future visit scheduled: No  Notes to clinic:  Prescriptions have expired.    Requested Prescriptions  Pending Prescriptions Disp Refills   DULoxetine  (CYMBALTA ) 30 MG capsule [Pharmacy Med Name: DULOXETINE  HCL 30 MG ORAL CAPSULE DELAYED RELEASE PARTICLES] 30 capsule 0    Sig: Take 1 capsule (30 mg total) by mouth daily.     Psychiatry: Antidepressants - SNRI - duloxetine  Failed - 09/19/2024 12:17 PM      Failed - Last BP in normal range    BP Readings from Last 1 Encounters:  07/22/24 (!) 151/84         Passed - Cr in normal range and within 360 days    Creat  Date Value Ref Range Status  07/19/2024 0.93 0.70 - 1.35 mg/dL Final   Creatinine,U  Date Value Ref Range Status  03/24/2016 6.10 mg/dL Final    Comment:      Effective February 29, 2016, [facility] [employer] will offer a new compendium for Prescription Drug Monitoring (PDM) and Drugs of Abuse Panel order codes. All [employer] toxicology codes will be inactivated Apr 16, 2016. Please contact your [facility] Account Representative directly, or call our Customer Service Department at [phone] for new testing options.      Creatinine, Urine  Date Value Ref Range Status  02/14/2014 135.62 mg/dL Final         Passed - eGFR is 30 or above and within 360 days    GFR, Est African American  Date Value Ref Range Status  08/21/2015 >89 >=60 mL/min Final   GFR calc Af Amer  Date Value Ref Range Status  06/04/2020 >60 >60 mL/min Final   GFR, Est Non African American  Date Value Ref Range Status  08/21/2015 >89 >=60 mL/min Final    Comment:      The estimated GFR is a calculation valid for adults (>=37 years old) that uses the CKD-EPI algorithm to adjust for age and sex. It is   not to be used for children, pregnant women, hospitalized patients,     patients on dialysis, or with rapidly changing kidney function. According to the NKDEP, eGFR >89 is normal, 60-89 shows mild impairment, 30-59 shows moderate impairment, 15-29 shows severe impairment and <15 is ESRD.      GFR, Estimated  Date Value Ref Range Status  07/06/2024 >60 >60 mL/min Final    Comment:    (NOTE) Calculated using the CKD-EPI Creatinine Equation (2021)    eGFR  Date Value Ref Range Status  07/19/2024 93 > OR = 60 mL/min/1.3m2 Final  03/27/2024 93 >59 mL/min/1.73 Final         Passed - Completed PHQ-2 or PHQ-9 in the last 360 days      Passed - Valid encounter within last 6 months    Recent Outpatient Visits           1 month ago Bilateral lower leg cellulitis   [place] [facility] [name], FNP   2 months ago Bilateral lower leg cellulitis   [place] [facility] Aletha Bene, MD   4 months ago Excessive daytime sleepiness   [place] [facility] Medicine [name], FNP   6 months ago Hypertension, unspecified type   [place] [facility] Medicine [name], [place]  6 months ago Physical exam, annual   [place] [facility] Medicine Kayla, Jeoffrey RAMAN, FNP               furosemide  (LASIX ) 40 MG tablet [Pharmacy Med Name: FUROSEMIDE  40 MG ORAL TABLET] 30 tablet 0    Sig: Take 1 tablet (40 mg total) by mouth in the morning.     Cardiovascular:  Diuretics - Loop Failed - 09/19/2024 12:17 PM      Failed - Last BP in normal range    BP Readings from Last 1 Encounters:  07/22/24 (!) 151/84         Passed - K in normal range and within 180 days    Potassium  Date Value Ref Range Status  07/19/2024 3.9 3.5 - 5.3 mmol/L Final         Passed - Ca in normal range and within 180 days    Calcium   Date Value Ref Range Status  07/19/2024 8.8 8.6 - 10.3 mg/dL Final   Calcium , Ion  Date Value Ref Range Status  07/11/2023 1.12 (L) 1.15 - 1.40 mmol/L Final          Passed - Na in normal range and within 180 days    Sodium  Date Value Ref Range Status  07/19/2024 138 135 - 146 mmol/L Final  03/27/2024 138 134 - 144 mmol/L Final         Passed - Cr in normal range and within 180 days    Creat  Date Value Ref Range Status  07/19/2024 0.93 0.70 - 1.35 mg/dL Final   Creatinine,U  Date Value Ref Range Status  03/24/2016 6.10 mg/dL Final    Comment:      Effective February 29, 2016, [facility] [employer] will offer a new compendium for Prescription Drug Monitoring (PDM) and Drugs of Abuse Panel order codes. All [employer] toxicology codes will be inactivated 03/30/16. Please contact your [facility] Account Representative directly, or call our Customer Service Department at [phone] for new testing options.      Creatinine, Urine  Date Value Ref Range Status  02/14/2014 135.62 mg/dL Final         Passed - Cl in normal range and within 180 days    Chloride  Date Value Ref Range Status  07/19/2024 104 98 - 110 mmol/L Final         Passed - Mg Level in normal range and within 180 days    Magnesium   Date Value Ref Range Status  07/19/2024 2.0 1.5 - 2.5 mg/dL Final         Passed - Valid encounter within last 6 months    Recent Outpatient Visits           1 month ago Bilateral lower leg cellulitis   [place] [facility] Medicine [name], FNP   2 months ago Bilateral lower leg cellulitis   [place] Aletha Bene, [place]   4 months ago Excessive daytime sleepiness   [place] [facility] [name], FNP   6 months ago Hypertension, unspecified type   [place] [facility] Medicine [name], FNP   6 months ago Physical exam, annual    [facility] Kayla Jeoffrey S, FNP               gabapentin  (NEURONTIN ) 100 MG capsule [Pharmacy Med Name: GABAPENTIN  100 MG  ORAL CAPSULE] 180  capsule 0    Sig: Take 2 capsules (200 mg total) by mouth 3 (three) times daily.     Neurology: Anticonvulsants - gabapentin  Passed - 09/19/2024 12:17 PM      Passed - Cr in normal range and within 360 days    Creat  Date Value Ref Range Status  07/19/2024 0.93 0.70 - 1.35 mg/dL Final   Creatinine,U  Date Value Ref Range Status  03/24/2016 6.10 mg/dL Final    Comment:      Effective February 29, 2016, [facility] [employer] will offer a new compendium for Prescription Drug Monitoring (PDM) and Drugs of Abuse Panel order codes. All [employer] toxicology codes will be inactivated 04/06/2016. Please contact your [facility] Account Representative directly, or call our Customer Service Department at [phone] for new testing options.      Creatinine, Urine  Date Value Ref Range Status  02/14/2014 135.62 mg/dL Final         Passed - Completed PHQ-2 or PHQ-9 in the last 360 days      Passed - Valid encounter within last 12 months    Recent Outpatient Visits           1 month ago Bilateral lower leg cellulitis   [place] [facility] Medicine [name], FNP   2 months ago Bilateral lower leg cellulitis   [place] [facility] Medicine Aletha Bene, [place]   4 months ago Excessive daytime sleepiness   [place] [facility] [name], FNP   6 months ago Hypertension, unspecified type   [place] [facility] Medicine [name], FNP   6 months ago Physical exam, annual   [place] [facility] Medicine [name], [place]

## 2024-09-19 NOTE — Telephone Encounter (Signed)
 Requested medications are due for refill today.  unsure  Requested medications are on the active medications list.  yes  Last refill. All 3 refilled 07/19/2024   Future visit scheduled.   no  Notes to clinic.  All 3 rxs were written to expire 09/17/2024 - Rxs are expired.    Requested Prescriptions  Pending Prescriptions Disp Refills   gabapentin  (NEURONTIN ) 100 MG capsule 180 capsule 0    Sig: Take 2 capsules (200 mg total) by mouth 3 (three) times daily.     Neurology: Anticonvulsants - gabapentin  Passed - 09/19/2024  1:11 PM      Passed - Cr in normal range and within 360 days    Creat  Date Value Ref Range Status  07/19/2024 0.93 0.70 - 1.35 mg/dL Final   Creatinine,U  Date Value Ref Range Status  03/24/2016 6.10 mg/dL Final    Comment:      Effective February 29, 2016, [facility] [employer] will offer a new compendium for Prescription Drug Monitoring (PDM) and Drugs of Abuse Panel order codes. All [employer] toxicology codes will be inactivated 2016/04/17. Please contact your [facility] Account Representative directly, or call our Customer Service Department at [phone] for new testing options.      Creatinine, Urine  Date Value Ref Range Status  02/14/2014 135.62 mg/dL Final         Passed - Completed PHQ-2 or PHQ-9 in the last 360 days      Passed - Valid encounter within last 12 months    Recent Outpatient Visits           1 month ago Bilateral lower leg cellulitis   [place] [facility] Medicine [name] Jeoffrey RAMAN, FNP   2 months ago Bilateral lower leg cellulitis   [place] [facility] [name] Bene, MD   4 months ago Excessive daytime sleepiness   [place] [facility] [name] Jeoffrey RAMAN, FNP   6 months ago Hypertension, unspecified type   [place] [facility] [name] Jeoffrey RAMAN, FNP   6 months ago Physical exam, annual   [place] [facility]  Medicine [name], Jeoffrey RAMAN, FNP               furosemide  (LASIX ) 40 MG tablet 30 tablet 0    Sig: Take 1 tablet (40 mg total) by mouth in the morning.     Cardiovascular:  Diuretics - Loop Failed - 09/19/2024  1:11 PM      Failed - Last BP in normal range    BP Readings from Last 1 Encounters:  07/22/24 (!) 151/84         Passed - K in normal range and within 180 days    Potassium  Date Value Ref Range Status  07/19/2024 3.9 3.5 - 5.3 mmol/L Final         Passed - Ca in normal range and within 180 days    Calcium   Date Value Ref Range Status  07/19/2024 8.8 8.6 - 10.3 mg/dL Final   Calcium , Ion  Date Value Ref Range Status  07/11/2023 1.12 (L) 1.15 - 1.40 mmol/L Final         Passed - Na in normal range and within 180 days    Sodium  Date Value Ref Range Status  07/19/2024 138 135 - 146 mmol/L Final  03/27/2024 138 134 - 144 mmol/L Final  Passed - Cr in normal range and within 180 days    Creat  Date Value Ref Range Status  07/19/2024 0.93 0.70 - 1.35 mg/dL Final   Creatinine,U  Date Value Ref Range Status  03/24/2016 6.10 mg/dL Final    Comment:      Effective February 29, 2016, [facility] [employer] will offer a new compendium for Prescription Drug Monitoring (PDM) and Drugs of Abuse Panel order codes. All [employer] toxicology codes will be inactivated 04/12/16. Please contact your [facility] Account Representative directly, or call our Customer Service Department at [phone] for new testing options.      Creatinine, Urine  Date Value Ref Range Status  02/14/2014 135.62 mg/dL Final         Passed - Cl in normal range and within 180 days    Chloride  Date Value Ref Range Status  07/19/2024 104 98 - 110 mmol/L Final         Passed - Mg Level in normal range and within 180 days    Magnesium   Date Value Ref Range Status  07/19/2024 2.0 1.5 - 2.5 mg/dL Final         Passed - Valid encounter within last 6  months    Recent Outpatient Visits           1 month ago Bilateral lower leg cellulitis   [place] [facility] Medicine [name] Jeoffrey RAMAN, FNP   2 months ago Bilateral lower leg cellulitis   [place] [facility] [name] Bene, MD   4 months ago Excessive daytime sleepiness   [place] [facility] [name] Jeoffrey RAMAN, FNP   6 months ago Hypertension, unspecified type   [place] [facility] [name] Jeoffrey RAMAN, FNP   6 months ago Physical exam, annual   [place] [facility] [name], Jeoffrey RAMAN, FNP               DULoxetine  (CYMBALTA ) 30 MG capsule 30 capsule 0    Sig: Take 1 capsule (30 mg total) by mouth daily.     Psychiatry: Antidepressants - SNRI - duloxetine  Failed - 09/19/2024  1:11 PM      Failed - Last BP in normal range    BP Readings from Last 1 Encounters:  07/22/24 (!) 151/84         Passed - Cr in normal range and within 360 days    Creat  Date Value Ref Range Status  07/19/2024 0.93 0.70 - 1.35 mg/dL Final   Creatinine,U  Date Value Ref Range Status  03/24/2016 6.10 mg/dL Final    Comment:      Effective February 29, 2016, [facility] [employer] will offer a new compendium for Prescription Drug Monitoring (PDM) and Drugs of Abuse Panel order codes. All [employer] toxicology codes will be inactivated 04/12/2016. Please contact your [facility] Account Representative directly, or call our Customer Service Department at [phone] for new testing options.      Creatinine, Urine  Date Value Ref Range Status  02/14/2014 135.62 mg/dL Final         Passed - eGFR is 30 or above and within 360 days    GFR, Est African American  Date Value Ref Range Status  08/21/2015 >89 >=60 mL/min Final   GFR calc Af Amer  Date Value Ref Range Status  06/04/2020 >60 >60 mL/min Final   GFR, Est Non  African American  Date Value Ref Range Status  08/21/2015 >89 >=60  mL/min Final    Comment:      The estimated GFR is a calculation valid for adults (>=21 years old) that uses the CKD-EPI algorithm to adjust for age and sex. It is   not to be used for children, pregnant women, hospitalized patients,    patients on dialysis, or with rapidly changing kidney function. According to the NKDEP, eGFR >89 is normal, 60-89 shows mild impairment, 30-59 shows moderate impairment, 15-29 shows severe impairment and <15 is ESRD.      GFR, Estimated  Date Value Ref Range Status  07/06/2024 >60 >60 mL/min Final    Comment:    (NOTE) Calculated using the CKD-EPI Creatinine Equation (2021)    eGFR  Date Value Ref Range Status  07/19/2024 93 > OR = 60 mL/min/1.23m2 Final  03/27/2024 93 >59 mL/min/1.73 Final         Passed - Completed PHQ-2 or PHQ-9 in the last 360 days      Passed - Valid encounter within last 6 months    Recent Outpatient Visits           1 month ago Bilateral lower leg cellulitis   [place] [facility] [name] Jeoffrey RAMAN, FNP   2 months ago Bilateral lower leg cellulitis   [place] [facility] [name] Bene, MD   4 months ago Excessive daytime sleepiness   [place] [facility] Medicine [name] Jeoffrey RAMAN, FNP   6 months ago Hypertension, unspecified type   [place] [facility] [name] Jeoffrey RAMAN, FNP   6 months ago Physical exam, annual   [place] [facility] [name] Jeoffrey RAMAN, FNP              Refused Prescriptions Disp Refills   rivaroxaban  (XARELTO ) 20 MG TABS tablet 90 tablet 0    Sig: Take 1 tablet (20 mg total) by mouth daily with supper.     Hematology: Anticoagulants - rivaroxaban  Failed - 09/19/2024  1:11 PM      Failed - HCT in normal range and within 360 days    HCT  Date Value Ref Range Status  07/19/2024 36.4 (L) 38.5 - 50.0 % Final         Failed - HGB in normal range and within 360 days    Hemoglobin  Date Value Ref Range  Status  07/19/2024 11.9 (L) 13.2 - 17.1 g/dL Final         Passed - ALT in normal range and within 360 days    ALT  Date Value Ref Range Status  07/19/2024 9 9 - 46 U/L Final         Passed - AST in normal range and within 360 days    AST  Date Value Ref Range Status  07/19/2024 12 10 - 35 U/L Final         Passed - Cr in normal range and within 360 days    Creat  Date Value Ref Range Status  07/19/2024 0.93 0.70 - 1.35 mg/dL Final   Creatinine,U  Date Value Ref Range Status  03/24/2016 6.10 mg/dL Final    Comment:      Effective February 29, 2016, [facility] [employer] will offer a new compendium for Prescription Drug Monitoring (PDM) and Drugs of Abuse Panel order codes. All [employer] toxicology codes will be inactivated 2016-04-21.  Please contact your [facility] Account Representative directly, or call our Customer Service Department at [phone] for new testing options.      Creatinine, Urine  Date Value Ref Range Status  02/14/2014 135.62 mg/dL Final         Passed - PLT in normal range and within 360 days    Platelets  Date Value Ref Range Status  07/19/2024 285 140 - 400 Thousand/uL Final         Passed - eGFR is 15 or above and within 360 days    GFR, Est African American  Date Value Ref Range Status  08/21/2015 >89 >=60 mL/min Final   GFR calc Af Amer  Date Value Ref Range Status  06/04/2020 >60 >60 mL/min Final   GFR, Est Non African American  Date Value Ref Range Status  08/21/2015 >89 >=60 mL/min Final    Comment:      The estimated GFR is a calculation valid for adults (>=68 years old) that uses the CKD-EPI algorithm to adjust for age and sex. It is   not to be used for children, pregnant women, hospitalized patients,    patients on dialysis, or with rapidly changing kidney function. According to the NKDEP, eGFR >89 is normal, 60-89 shows mild impairment, 30-59 shows moderate impairment, 15-29 shows  severe impairment and <15 is ESRD.      GFR, Estimated  Date Value Ref Range Status  07/06/2024 >60 >60 mL/min Final    Comment:    (NOTE) Calculated using the CKD-EPI Creatinine Equation (2021)    eGFR  Date Value Ref Range Status  07/19/2024 93 > OR = 60 mL/min/1.37m2 Final  03/27/2024 93 >59 mL/min/1.73 Final         Passed - Patient is not pregnant      Passed - Valid encounter within last 12 months    Recent Outpatient Visits           1 month ago Bilateral lower leg cellulitis   [place] [facility] [name] Jeoffrey RAMAN, FNP   2 months ago Bilateral lower leg cellulitis   [place] [facility] [name] Bene, MD   4 months ago Excessive daytime sleepiness   [place] [facility] Medicine [name] Jeoffrey RAMAN, FNP   6 months ago Hypertension, unspecified type   [place] [facility] Medicine [name] Jeoffrey RAMAN, FNP   6 months ago Physical exam, annual   [place] [facility] [name], Jeoffrey RAMAN, FNP               loratadine  (CLARITIN ) 10 MG tablet 90 tablet 0    Sig: Take 1 tablet (10 mg total) by mouth daily.     Ear, Nose, and Throat:  Antihistamines 2 Passed - 09/19/2024  1:11 PM      Passed - Cr in normal range and within 360 days    Creat  Date Value Ref Range Status  07/19/2024 0.93 0.70 - 1.35 mg/dL Final   Creatinine,U  Date Value Ref Range Status  03/24/2016 6.10 mg/dL Final    Comment:      Effective February 29, 2016, [facility] [employer] will offer a new compendium for Prescription Drug Monitoring (PDM) and Drugs of Abuse Panel order codes. All [employer] toxicology codes will be inactivated Apr 23, 2016. Please contact your [facility] Account Representative directly, or call our Customer Service Department at [phone] for new testing options.  Creatinine, Urine  Date Value Ref Range Status  02/14/2014 135.62 mg/dL Final         Passed -  Valid encounter within last 12 months    Recent Outpatient Visits           1 month ago Bilateral lower leg cellulitis   [place] [facility] [name] Jeoffrey RAMAN, FNP   2 months ago Bilateral lower leg cellulitis   [place] [facility] [name] Bene, MD   4 months ago Excessive daytime sleepiness   [place] [facility] [name] Jeoffrey RAMAN, FNP   6 months ago Hypertension, unspecified type   [place] [facility] Medicine [name] Jeoffrey RAMAN, FNP   6 months ago Physical exam, annual    [facility] [name] Jeoffrey RAMAN, FNP               s

## 2024-09-20 ENCOUNTER — Other Ambulatory Visit: Payer: Self-pay | Admitting: Licensed Clinical Social Worker

## 2024-09-20 ENCOUNTER — Other Ambulatory Visit (HOSPITAL_BASED_OUTPATIENT_CLINIC_OR_DEPARTMENT_OTHER): Payer: Self-pay | Admitting: Family

## 2024-09-20 ENCOUNTER — Other Ambulatory Visit: Payer: Self-pay | Admitting: Family Medicine

## 2024-09-20 ENCOUNTER — Other Ambulatory Visit: Payer: Self-pay

## 2024-09-20 ENCOUNTER — Telehealth: Payer: Self-pay

## 2024-09-20 DIAGNOSIS — Z7901 Long term (current) use of anticoagulants: Secondary | ICD-10-CM

## 2024-09-20 DIAGNOSIS — J302 Other seasonal allergic rhinitis: Secondary | ICD-10-CM

## 2024-09-20 DIAGNOSIS — F32A Depression, unspecified: Secondary | ICD-10-CM

## 2024-09-20 DIAGNOSIS — I5032 Chronic diastolic (congestive) heart failure: Secondary | ICD-10-CM

## 2024-09-20 DIAGNOSIS — G894 Chronic pain syndrome: Secondary | ICD-10-CM

## 2024-09-20 DIAGNOSIS — R600 Localized edema: Secondary | ICD-10-CM

## 2024-09-20 DIAGNOSIS — I1A0 Resistant hypertension: Secondary | ICD-10-CM

## 2024-09-20 MED ORDER — LORATADINE 10 MG PO TABS: 10.0000 mg | ORAL_TABLET | Freq: Every day | ORAL | 0 refills | Status: DC

## 2024-09-20 MED ORDER — FUROSEMIDE 40 MG PO TABS: 40.0000 mg | ORAL_TABLET | Freq: Every morning | ORAL | 0 refills | Status: DC

## 2024-09-20 MED ORDER — GABAPENTIN 100 MG PO CAPS: 200.0000 mg | ORAL_CAPSULE | Freq: Three times a day (TID) | ORAL | 0 refills | Status: DC

## 2024-09-20 MED ORDER — RIVAROXABAN 20 MG PO TABS: 20.0000 mg | ORAL_TABLET | Freq: Every day | ORAL | 0 refills | Status: DC

## 2024-09-20 MED ORDER — DULOXETINE HCL 30 MG PO CPEP: 30.0000 mg | ORAL_CAPSULE | Freq: Every day | ORAL | 0 refills | Status: DC

## 2024-09-20 MED ORDER — ALBUTEROL SULFATE HFA 108 (90 BASE) MCG/ACT IN AERS: 2.0000 | INHALATION_SPRAY | Freq: Four times a day (QID) | RESPIRATORY_TRACT | 0 refills | Status: AC | PRN

## 2024-09-20 NOTE — Telephone Encounter (Signed)
 Copied from CRM #[id number]. Topic: Clinical - Prescription Issue >> Sep 20, 2024 11:48 AM Hadassah PARAS wrote: Reason for CRM: Pt has submitted refill requested twice and no updates. Please advise # [phone], pt is out of medication

## 2024-09-20 NOTE — Telephone Encounter (Signed)
 Requested medications are due for refill today.  unsure   Requested medications are on the active medications list.  yes   Last refill. 03/20/2024 8g 0 rf   Future visit scheduled.   no   Notes to clinic.  Medication has only been ordered 1 time. Please review for refill.   Requested Prescriptions  Pending Prescriptions Disp Refills   albuterol  (VENTOLIN  HFA) 108 (90 Base) MCG/ACT inhaler 8 g 0    Sig: Inhale 2 puffs into the lungs every 6 (six) hours as needed for wheezing or shortness of breath.     Pulmonology:  Beta Agonists 2 Failed - 09/20/2024 11:21 AM      Failed - Last BP in normal range    BP Readings from Last 1 Encounters:  07/22/24 (!) 151/84         Passed - Last Heart Rate in normal range    Pulse Readings from Last 1 Encounters:  07/22/24 68         Passed - Valid encounter within last 12 months    Recent Outpatient Visits           2 months ago Bilateral lower leg cellulitis   [place] [facility] Medicine [name], FNP   2 months ago Bilateral lower leg cellulitis   [place] [facility] Aletha Bene, MD   4 months ago Excessive daytime sleepiness   [place] [facility] Medicine [name], FNP   6 months ago Hypertension, unspecified type   [place] [facility] [name], FNP   6 months ago Physical exam, annual    [facility] [name], [place]

## 2024-09-20 NOTE — Telephone Encounter (Signed)
 Copied from CRM #[id number]. Topic: Clinical - Medication Refill >> Sep 20, 2024 11:46 AM Hadassah PARAS wrote: Medication: gabapentin  (NEURONTIN ) 100 MG capsule albuterol  (VENTOLIN  HFA) 108 (90 Base) MCG/ACT inhaler  loratadine  (CLARITIN ) 10 MG tablet  rivaroxaban  (XARELTO ) 20 MG TABS tablet  DULoxetine  (CYMBALTA ) 30 MG capsule  furosemide  (LASIX ) 40 MG tablet   Has the patient contacted their pharmacy? Yes (Agent: If no, request that the patient contact the pharmacy for the refill. If patient does not wish to contact the pharmacy document the reason why and proceed with request.) (Agent: If yes, when and what did the pharmacy advise?)  This is the patient's preferred pharmacy:  [facility] - [place], [place] - [address] [place] [address] Phone: [phone] Fax: [phone]  [facility] [address] [place] [address] Phone: [phone] Fax: [phone]  Is this the correct pharmacy for this prescription? Yes If no, delete pharmacy and type the correct one.   Has the prescription been filled recently? No  Is the patient out of the medication? Yes  Has the patient been seen for an appointment in the last year OR does the patient have an upcoming appointment? Yes  Can we respond through MyChart? Yes  Agent: Please be advised that Rx refills may take up to 3 business days. We ask that you follow-up with your pharmacy.

## 2024-09-21 DIAGNOSIS — Z7689 Persons encountering health services in other specified circumstances: Secondary | ICD-10-CM | POA: Diagnosis not present

## 2024-09-23 NOTE — Telephone Encounter (Signed)
 Duplicate request, LRF 09/20/24 for 30 days.  Requested Prescriptions  Pending Prescriptions Disp Refills   gabapentin  (NEURONTIN ) 100 MG capsule 180 capsule 0    Sig: Take 2 capsules (200 mg total) by mouth 3 (three) times daily.     Neurology: Anticonvulsants - gabapentin  Passed - 09/23/2024 10:13 AM      Passed - Cr in normal range and within 360 days    Creat  Date Value Ref Range Status  07/19/2024 0.93 0.70 - 1.35 mg/dL Final   Creatinine,U  Date Value Ref Range Status  03/24/2016 6.10 mg/dL Final    Comment:      Effective February 29, 2016, [facility] [employer] will offer a new compendium for Prescription Drug Monitoring (PDM) and Drugs of Abuse Panel order codes. All [employer] toxicology codes will be inactivated 19-Apr-2016. Please contact your [facility] Account Representative directly, or call our Customer Service Department at [phone] for new testing options.      Creatinine, Urine  Date Value Ref Range Status  02/14/2014 135.62 mg/dL Final         Passed - Completed PHQ-2 or PHQ-9 in the last 360 days      Passed - Valid encounter within last 12 months    Recent Outpatient Visits           2 months ago Bilateral lower leg cellulitis   [place] [facility] [name], FNP   2 months ago Bilateral lower leg cellulitis   [place] [facility] Aletha Bene, MD   4 months ago Excessive daytime sleepiness   [place] [facility] [name], FNP   6 months ago Hypertension, unspecified type   [place] [facility] [name], FNP   6 months ago Physical exam, annual   [place] [facility] Kayla, Jeoffrey RAMAN, FNP               albuterol  (VENTOLIN  HFA) 108 (90 Base) MCG/ACT inhaler 8 g 0    Sig: Inhale 2 puffs into the lungs every 6 (six) hours as needed for wheezing or shortness of breath.     Pulmonology:  Beta  Agonists 2 Failed - 09/23/2024 10:13 AM      Failed - Last BP in normal range    BP Readings from Last 1 Encounters:  07/22/24 (!) 151/84         Passed - Last Heart Rate in normal range    Pulse Readings from Last 1 Encounters:  07/22/24 68         Passed - Valid encounter within last 12 months    Recent Outpatient Visits           2 months ago Bilateral lower leg cellulitis   [place] [facility] [name], FNP   2 months ago Bilateral lower leg cellulitis   [place] [facility] Aletha Bene, MD   4 months ago Excessive daytime sleepiness   [place] [facility] [name], FNP   6 months ago Hypertension, unspecified type   [place] [facility] [name], FNP   6 months ago Physical exam, annual    [facility] Kayla Jeoffrey S, FNP               loratadine  (CLARITIN ) 10 MG tablet 90 tablet 0  Sig: Take 1 tablet (10 mg total) by mouth daily.     Ear, Nose, and Throat:  Antihistamines 2 Passed - 09/23/2024 10:13 AM      Passed - Cr in normal range and within 360 days    Creat  Date Value Ref Range Status  07/19/2024 0.93 0.70 - 1.35 mg/dL Final   Creatinine,U  Date Value Ref Range Status  03/24/2016 6.10 mg/dL Final    Comment:      Effective February 29, 2016, [facility] [employer] will offer a new compendium for Prescription Drug Monitoring (PDM) and Drugs of Abuse Panel order codes. All [employer] toxicology codes will be inactivated 2016/04/20. Please contact your [facility] Account Representative directly, or call our Customer Service Department at [phone] for new testing options.      Creatinine, Urine  Date Value Ref Range Status  02/14/2014 135.62 mg/dL Final         Passed - Valid encounter within last 12 months    Recent Outpatient Visits           2 months ago Bilateral lower leg  cellulitis   [place] [facility] Medicine [name], FNP   2 months ago Bilateral lower leg cellulitis   [place] [facility] Ale[facility] Bene, MD   4 months ago Excessive daytime sleepiness   [place] [facility] [name], FNP   6 months ago Hypertension, unspecified type   [place] [facility] [name], FNP   6 months ago Physical exam, annual   [place] [facility] Kayla, Jeoffrey RAMAN, FNP               rivaroxaban  (XARELTO ) 20 MG TABS tablet 90 tablet 0    Sig: Take 1 tablet (20 mg total) by mouth daily with supper.     Hematology: Anticoagulants - rivaroxaban  Failed - 09/23/2024 10:13 AM      Failed - HCT in normal range and within 360 days    HCT  Date Value Ref Range Status  07/19/2024 36.4 (L) 38.5 - 50.0 % Final         Failed - HGB in normal range and within 360 days    Hemoglobin  Date Value Ref Range Status  07/19/2024 11.9 (L) 13.2 - 17.1 g/dL Final         Passed - ALT in normal range and within 360 days    ALT  Date Value Ref Range Status  07/19/2024 9 9 - 46 U/L Final         Passed - AST in normal range and within 360 days    AST  Date Value Ref Range Status  07/19/2024 12 10 - 35 U/L Final         Passed - Cr in normal range and within 360 days    Creat  Date Value Ref Range Status  07/19/2024 0.93 0.70 - 1.35 mg/dL Final   Creatinine,U  Date Value Ref Range Status  03/24/2016 6.10 mg/dL Final    Comment:      Effective February 29, 2016, [facility] [employer] will offer a new compendium for Prescription Drug Monitoring (PDM) and Drugs of Abuse Panel order codes. All [employer] toxicology codes will be inactivated 20-Apr-2016. Please contact your [facility] Account Representative directly, or call our Customer Service Department at [phone] for new testing options.  Creatinine, Urine  Date Value Ref  Range Status  02/14/2014 135.62 mg/dL Final         Passed - PLT in normal range and within 360 days    Platelets  Date Value Ref Range Status  07/19/2024 285 140 - 400 Thousand/uL Final         Passed - eGFR is 15 or above and within 360 days    GFR, Est African American  Date Value Ref Range Status  08/21/2015 >89 >=60 mL/min Final   GFR calc Af Amer  Date Value Ref Range Status  06/04/2020 >60 >60 mL/min Final   GFR, Est Non African American  Date Value Ref Range Status  08/21/2015 >89 >=60 mL/min Final    Comment:      The estimated GFR is a calculation valid for adults (>=37 years old) that uses the CKD-EPI algorithm to adjust for age and sex. It is   not to be used for children, pregnant women, hospitalized patients,    patients on dialysis, or with rapidly changing kidney function. According to the NKDEP, eGFR >89 is normal, 60-89 shows mild impairment, 30-59 shows moderate impairment, 15-29 shows severe impairment and <15 is ESRD.      GFR, Estimated  Date Value Ref Range Status  07/06/2024 >60 >60 mL/min Final    Comment:    (NOTE) Calculated using the CKD-EPI Creatinine Equation (2021)    eGFR  Date Value Ref Range Status  07/19/2024 93 > OR = 60 mL/min/1.58m2 Final  03/27/2024 93 >59 mL/min/1.73 Final         Passed - Patient is not pregnant      Passed - Valid encounter within last 12 months    Recent Outpatient Visits           2 months ago Bilateral lower leg cellulitis   [place] [facility] [name], FNP   2 months ago Bilateral lower leg cellulitis   [place] [facility] Aletha Bene, MD   4 months ago Excessive daytime sleepiness   [place] [facility] [name], FNP   6 months ago Hypertension, unspecified type   [place] [facility] [name], FNP   6 months ago Physical exam, annual   [place] [facility] Kayla,  Jeoffrey RAMAN, FNP               DULoxetine  (CYMBALTA ) 30 MG capsule 30 capsule 0    Sig: Take 1 capsule (30 mg total) by mouth daily.     Psychiatry: Antidepressants - SNRI - duloxetine  Failed - 09/23/2024 10:13 AM      Failed - Last BP in normal range    BP Readings from Last 1 Encounters:  07/22/24 (!) 151/84         Passed - Cr in normal range and within 360 days    Creat  Date Value Ref Range Status  07/19/2024 0.93 0.70 - 1.35 mg/dL Final   Creatinine,U  Date Value Ref Range Status  03/24/2016 6.10 mg/dL Final    Comment:      Effective February 29, 2016, [facility] [employer] will offer a new compendium for Prescription Drug Monitoring (PDM) and Drugs of Abuse Panel order codes. All [employer] toxicology codes will be inactivated 2016-04-12. Please contact your [facility] Account Representative directly, or call our Customer Service Department at [phone] for new testing options.  Creatinine, Urine  Date Value Ref Range Status  02/14/2014 135.62 mg/dL Final         Passed - eGFR is 30 or above and within 360 days    GFR, Est African American  Date Value Ref Range Status  08/21/2015 >89 >=60 mL/min Final   GFR calc Af Amer  Date Value Ref Range Status  06/04/2020 >60 >60 mL/min Final   GFR, Est Non African American  Date Value Ref Range Status  08/21/2015 >89 >=60 mL/min Final    Comment:      The estimated GFR is a calculation valid for adults (>=110 years old) that uses the CKD-EPI algorithm to adjust for age and sex. It is   not to be used for children, pregnant women, hospitalized patients,    patients on dialysis, or with rapidly changing kidney function. According to the NKDEP, eGFR >89 is normal, 60-89 shows mild impairment, 30-59 shows moderate impairment, 15-29 shows severe impairment and <15 is ESRD.      GFR, Estimated  Date Value Ref Range Status  07/06/2024 >60 >60 mL/min Final    Comment:     (NOTE) Calculated using the CKD-EPI Creatinine Equation (2021)    eGFR  Date Value Ref Range Status  07/19/2024 93 > OR = 60 mL/min/1.68m2 Final  03/27/2024 93 >59 mL/min/1.73 Final         Passed - Completed PHQ-2 or PHQ-9 in the last 360 days      Passed - Valid encounter within last 6 months    Recent Outpatient Visits           2 months ago Bilateral lower leg cellulitis   [place] [facility] [name], FNP   2 months ago Bilateral lower leg cellulitis   [place] [facility] Aletha Bene, MD   4 months ago Excessive daytime sleepiness   [place] [facility] [name], FNP   6 months ago Hypertension, unspecified type   [place] [facility] [name], FNP   6 months ago Physical exam, annual   [place] [facility] Medicine Kayla, Jeoffrey RAMAN, FNP               furosemide  (LASIX ) 40 MG tablet 30 tablet 0    Sig: Take 1 tablet (40 mg total) by mouth in the morning.     Cardiovascular:  Diuretics - Loop Failed - 09/23/2024 10:13 AM      Failed - Last BP in normal range    BP Readings from Last 1 Encounters:  07/22/24 (!) 151/84         Passed - K in normal range and within 180 days    Potassium  Date Value Ref Range Status  07/19/2024 3.9 3.5 - 5.3 mmol/L Final         Passed - Ca in normal range and within 180 days    Calcium   Date Value Ref Range Status  07/19/2024 8.8 8.6 - 10.3 mg/dL Final   Calcium , Ion  Date Value Ref Range Status  07/11/2023 1.12 (L) 1.15 - 1.40 mmol/L Final         Passed - Na in normal range and within 180 days    Sodium  Date Value Ref Range Status  07/19/2024 138 135 - 146 mmol/L Final  03/27/2024 138 134 - 144 mmol/L Final         Passed - Cr in  normal range and within 180 days    Creat  Date Value Ref Range Status  07/19/2024 0.93 0.70 - 1.35 mg/dL Final   Creatinine,U  Date Value Ref Range Status   03/24/2016 6.10 mg/dL Final    Comment:      Effective February 29, 2016, [facility] [employer] will offer a new compendium for Prescription Drug Monitoring (PDM) and Drugs of Abuse Panel order codes. All [employer] toxicology codes will be inactivated 2016/04/24. Please contact your [facility] Account Representative directly, or call our Customer Service Department at [phone] for new testing options.      Creatinine, Urine  Date Value Ref Range Status  02/14/2014 135.62 mg/dL Final         Passed - Cl in normal range and within 180 days    Chloride  Date Value Ref Range Status  07/19/2024 104 98 - 110 mmol/L Final         Passed - Mg Level in normal range and within 180 days    Magnesium   Date Value Ref Range Status  07/19/2024 2.0 1.5 - 2.5 mg/dL Final         Passed - Valid encounter within last 6 months    Recent Outpatient Visits           2 months ago Bilateral lower leg cellulitis   [place] [facility] [name], FNP   2 months ago Bilateral lower leg cellulitis   [place] [facility] Aletha Bene, MD   4 months ago Excessive daytime sleepiness   [place] [facility] Medicine [name], FNP   6 months ago Hypertension, unspecified type   [place] [facility] [name], FNP   6 months ago Physical exam, annual   [place] [facility] [name], [place]

## 2024-09-24 ENCOUNTER — Encounter (HOSPITAL_BASED_OUTPATIENT_CLINIC_OR_DEPARTMENT_OTHER): Admitting: General Surgery

## 2024-09-24 DIAGNOSIS — L97822 Non-pressure chronic ulcer of other part of left lower leg with fat layer exposed: Secondary | ICD-10-CM | POA: Diagnosis not present

## 2024-09-24 DIAGNOSIS — L97812 Non-pressure chronic ulcer of other part of right lower leg with fat layer exposed: Secondary | ICD-10-CM | POA: Diagnosis not present

## 2024-09-24 DIAGNOSIS — F17210 Nicotine dependence, cigarettes, uncomplicated: Secondary | ICD-10-CM | POA: Diagnosis not present

## 2024-09-24 DIAGNOSIS — I890 Lymphedema, not elsewhere classified: Secondary | ICD-10-CM | POA: Diagnosis not present

## 2024-09-24 DIAGNOSIS — I87333 Chronic venous hypertension (idiopathic) with ulcer and inflammation of bilateral lower extremity: Secondary | ICD-10-CM | POA: Diagnosis not present

## 2024-09-24 DIAGNOSIS — I5030 Unspecified diastolic (congestive) heart failure: Secondary | ICD-10-CM | POA: Diagnosis not present

## 2024-09-25 DIAGNOSIS — Z7689 Persons encountering health services in other specified circumstances: Secondary | ICD-10-CM | POA: Diagnosis not present

## 2024-09-27 ENCOUNTER — Encounter (HOSPITAL_BASED_OUTPATIENT_CLINIC_OR_DEPARTMENT_OTHER): Admitting: General Surgery

## 2024-09-27 DIAGNOSIS — Z7689 Persons encountering health services in other specified circumstances: Secondary | ICD-10-CM | POA: Diagnosis not present

## 2024-09-27 NOTE — Telephone Encounter (Signed)
 Pt received letter from insurance that his sleep study has been approved. Calling for further steps.

## 2024-09-28 DIAGNOSIS — Z7689 Persons encountering health services in other specified circumstances: Secondary | ICD-10-CM | POA: Diagnosis not present

## 2024-09-30 ENCOUNTER — Encounter (HOSPITAL_BASED_OUTPATIENT_CLINIC_OR_DEPARTMENT_OTHER): Admitting: General Surgery

## 2024-09-30 DIAGNOSIS — F17210 Nicotine dependence, cigarettes, uncomplicated: Secondary | ICD-10-CM | POA: Diagnosis not present

## 2024-09-30 DIAGNOSIS — I5030 Unspecified diastolic (congestive) heart failure: Secondary | ICD-10-CM | POA: Diagnosis not present

## 2024-09-30 DIAGNOSIS — Z7689 Persons encountering health services in other specified circumstances: Secondary | ICD-10-CM | POA: Diagnosis not present

## 2024-09-30 DIAGNOSIS — L97812 Non-pressure chronic ulcer of other part of right lower leg with fat layer exposed: Secondary | ICD-10-CM | POA: Diagnosis not present

## 2024-09-30 DIAGNOSIS — I87333 Chronic venous hypertension (idiopathic) with ulcer and inflammation of bilateral lower extremity: Secondary | ICD-10-CM | POA: Diagnosis not present

## 2024-09-30 DIAGNOSIS — I890 Lymphedema, not elsewhere classified: Secondary | ICD-10-CM | POA: Diagnosis not present

## 2024-09-30 DIAGNOSIS — L97822 Non-pressure chronic ulcer of other part of left lower leg with fat layer exposed: Secondary | ICD-10-CM | POA: Diagnosis not present

## 2024-10-01 NOTE — Patient Instructions (Signed)
 Visit Information  Thank you for taking time to visit with me today. Please don't hesitate to contact me if I can be of assistance to you before our next scheduled appointment.  Our next appointment is by telephone on 10/09/2024 at 1pm Please call the care guide team at [phone] if you need to cancel or reschedule your appointment.   Following is a copy of your care plan:   Goals Addressed   None     Please call the Suicide and Crisis Lifeline: 988 go to [facility] Urgent [facility] [address], [place] [phone]) call 911 if you are experiencing a Mental Health or Behavioral Health Crisis or need someone to talk to.  Patient verbalized understanding of Care plan and visit instructions communicated this visit  Tobias CHARM [name], PhD [facility], [facility] Social Worker Direct Dial: [phone]  Fax: [phone]

## 2024-10-01 NOTE — Patient Outreach (Signed)
 SW called patient and started the conversation but the pateitn was having some phone issues and the SW attempted to call back and no answer. SW will reschedule again   [name] CHARM [name], PhD [facility], [facility] Social Worker Direct Dial: [phone]  Fax: [phone]

## 2024-10-02 ENCOUNTER — Encounter (HOSPITAL_BASED_OUTPATIENT_CLINIC_OR_DEPARTMENT_OTHER): Payer: Self-pay

## 2024-10-07 ENCOUNTER — Ambulatory Visit: Admitting: Family Medicine

## 2024-10-07 NOTE — Telephone Encounter (Addendum)
 Prior Authorization for cpap titration sent to [facility] via web portal. Tracking Number -. Auth # [id number] 09-19-2024-to-12-19-2024.   Titration sent to sleep lab for scheduling and patient notified to call and self schedule.

## 2024-10-08 ENCOUNTER — Encounter (HOSPITAL_BASED_OUTPATIENT_CLINIC_OR_DEPARTMENT_OTHER): Admitting: General Surgery

## 2024-10-08 DIAGNOSIS — F17210 Nicotine dependence, cigarettes, uncomplicated: Secondary | ICD-10-CM | POA: Diagnosis not present

## 2024-10-08 DIAGNOSIS — L97812 Non-pressure chronic ulcer of other part of right lower leg with fat layer exposed: Secondary | ICD-10-CM | POA: Insufficient documentation

## 2024-10-08 DIAGNOSIS — I890 Lymphedema, not elsewhere classified: Secondary | ICD-10-CM | POA: Diagnosis not present

## 2024-10-08 DIAGNOSIS — L97822 Non-pressure chronic ulcer of other part of left lower leg with fat layer exposed: Secondary | ICD-10-CM | POA: Diagnosis not present

## 2024-10-08 DIAGNOSIS — I5030 Unspecified diastolic (congestive) heart failure: Secondary | ICD-10-CM | POA: Diagnosis not present

## 2024-10-08 DIAGNOSIS — I87333 Chronic venous hypertension (idiopathic) with ulcer and inflammation of bilateral lower extremity: Secondary | ICD-10-CM | POA: Diagnosis present

## 2024-10-09 ENCOUNTER — Other Ambulatory Visit: Payer: Self-pay | Admitting: Licensed Clinical Social Worker

## 2024-10-09 NOTE — Patient Outreach (Signed)
 Social Drivers of Health  Community Resource and Care Coordination Visit Note   10/09/2024  Name: [name] [name] MRN: [id number] DOB:1961/03/08  Situation: Referral received for [facility] needs assessment and assistance related to Transportation. I obtained verbal consent from Patient.  Visit completed with Patient on the phone.   Background:   SDOH Interventions Today    Flowsheet Row Most Recent Value  SDOH Interventions   Food Insecurity Interventions Intervention Not Indicated  Housing Interventions Intervention Not Indicated  Transportation Interventions Other (Comment)  [Patient stated that he is working with [employer] for his transportation and wants to give them a chance to assist]  Utilities Interventions Intervention Not Indicated     Assessment:   Goals Addressed             This Visit's Progress    BSW VBCI Social Work Care Plan       Current SDOH Barriers:  Transportation  Interventions: Patient interviewed and appropriate screenings performed Patient stated that he is currently working with Medicaid for assistance with transportation and he wants to give them a opportunity to assist.          Recommendation:   call for transportation assistance at least one week before appointments Follow up with Medicaid for transportation   Follow Up Plan:   Telephone follow up appointment date/time:  10/23/2024 at 1:00 pm  Tobias CHARM [name], PhD [facility], [facility] [place] Social Worker Direct Dial: [phone]  Fax: [phone]

## 2024-10-09 NOTE — Patient Instructions (Signed)
 Visit Information  Thank you for taking time to visit with me today. Please don't hesitate to contact me if I can be of assistance to you before our next scheduled appointment.  Your next care management appointment is by telephone on 10/23/2024 at 1:00pm    Please call the care guide team at [phone] if you need to cancel, schedule, or reschedule an appointment.   Please call the Suicide and Crisis Lifeline: 988 go to [facility] Urgent [facility] [address] [place] [phone]) call 911 if you are experiencing a Mental Health or Behavioral Health Crisis or need someone to talk to.  Tobias CHARM [name], PhD [facility], [facility] Social Worker Direct Dial: [phone]  Fax: [phone]

## 2024-10-11 ENCOUNTER — Telehealth: Payer: Self-pay

## 2024-10-11 ENCOUNTER — Telehealth: Payer: Self-pay | Admitting: Family

## 2024-10-11 MED ORDER — VALSARTAN 160 MG PO TABS: 160.0000 mg | ORAL_TABLET | Freq: Every day | ORAL | 1 refills | Status: DC

## 2024-10-11 NOTE — Telephone Encounter (Signed)
 Pt c/o BP issue: STAT if pt c/o blurred vision, one-sided weakness or slurred speech.  STAT if BP is GREATER than 180/120 TODAY.  STAT if BP is LESS than 90/60 and SYMPTOMATIC TODAY  1. What is your BP concern? Nurse called in stating pt bp is high and he is not on any bp med. Pt did not come to appt 08/29/24, he needs to r/s, Schedulers are out today   2. Have you taken any BP medication today? No   3. What are your last 5 BP readings?    12/3: 211/112 (was advised to go to the ED, did not go)  12/5: 214/102    4. Are you having any other symptoms (ex. Dizziness, headache, blurred vision, passed out)? No

## 2024-10-11 NOTE — Patient Instructions (Signed)
 [name] [name] - I am sorry I was unable to reach you today for our scheduled appointment.   Your next care management appointment is by telephone on Wednesday, December 24 at 10:30 AM  Please call the care guide team at [phone] if you need to cancel, schedule, or reschedule an appointment.   Please call [phone]-TALK (toll free, 24 hour hotline) if you are experiencing a Mental Health or Behavioral Health Crisis or need someone to talk to.  Thank you,   Clayborne Ly RN BSN CCM [place]  [facility], [facility] Nurse Care Coordinator  Direct Dial: [phone] Website: [email or web address]

## 2024-10-11 NOTE — Telephone Encounter (Signed)
 Call received from Dottie, the nurse at [facility].  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  His BP has been elevated.  Has been to PCP without any medication changes.  Follows here in HTN clinic.  Was hospitalized in Aug for cellulitis and during that stay HTN meds were stopped.  BP today is 214/102 and Wed 211/112.  Reviewed with APP who recommends restarting Valsartan  160 mg daily and come in for office visit early next week.  Reviewed this plan with patient and nurse at methadone  clinic.  Pt voices understanding and he is grateful for assistance.

## 2024-10-14 ENCOUNTER — Ambulatory Visit (HOSPITAL_BASED_OUTPATIENT_CLINIC_OR_DEPARTMENT_OTHER): Admitting: Family

## 2024-10-14 DIAGNOSIS — Z7689 Persons encountering health services in other specified circumstances: Secondary | ICD-10-CM | POA: Diagnosis not present

## 2024-10-14 NOTE — Progress Notes (Deleted)
 Advanced Hypertension Clinic Assessment:    Date:  10/14/2024   ID:  [name] [name], DOB 21-Aug-1961, MRN [id number]  PCP:  [name], FNP  Cardiologist:  None  Nephrologist:  Referring MD: [name], FNP   CC: Hypertension  History of Present Illness:    [name] [name] is a 63 y.o. male with a hx of hypertension, COPD, CVA 2016, HLD, substance abuse. Here to follow up in the Advanced Hypertension Clinic.   Reports prior history of heart failure. Echo 09/2015 normal LVEF 55-60%, no RWMA, mildly calcified aortic valve leaflets with no regurgitation or stenosis, diastolic function not commented upon.  Established with [facility]pert[facility] Clinic 03/27/24. [name] [name] was diagnosed with hypertension late 30s and early 58s. It has been difficult to control. Smoking 1.5 PPD, gradually reducing from 3 PPD. He had been in recovery for 8 months with assistance of methadone  and congratulated. He was following a low sodium diet. He was bothered by nocturia and fatigue.   Renin-aldosterone ratio ordered but not collected. Renal artery duplex and echo ordered but not completed. Sleep study performed revealing severe OSA with AHI 35.5/h.SABRA  He was started on CPAP.  Admitted 8/25 - 07/06/2024 with lymphedema, LLE cellulitis.  Diuresed over 20 pounds.  Discharged on Lasix  20 mg daily.  HCTZ was discontinued. Echo 06/2024 poor imaging, LVEF and wall motion unable to be assessed.   He was advised to stop smoking.  Clonidine , valsartan  were also discontinued though unclear why.   We were contacted 10/11/2024 by nurse as methadone  clinic regarding persistently elevated BP readings such as 214/102, 211/112.  Valsartan  was resumed at reduced dose 160 mg daily scheduled for follow-up.  *** ?  Wearing CPAP   Previous antihypertensives: Hydralazine  Amlodipine  Olmesartan  Doxazosin   Past Medical History:  Diagnosis Date   Allergy    Anxiety    Asthma    Cancer (HCC)    CHF  (congestive heart failure) (HCC)    Constipation due to pain medication    COPD (chronic obstructive pulmonary disease) (HCC)    CVA (cerebral infarction) 09/18/2015   Deep venous thrombosis (DVT) of left peroneal vein (HCC) 09/12/2023   Depression    Emphysema of lung (HCC)    History of DVT (deep vein thrombosis) 07/01/2024   History of pulmonary embolism 07/19/2016   Hyperlipidemia    Hypertension    Migraine    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   onset 02/14/14   Opioid abuse (HCC)    PE (pulmonary embolism)    DVT left leg   Positive colorectal cancer screening using Cologuard test 04/19/2019   Status post spinal surgery    Stroke [facility])    Substance abuse (HCC)    Substance use disorder 10/05/2022   Tobacco abuse     Past Surgical History:  Procedure Laterality Date   SPINAL FIXATION SURGERY     cervical   SPINE SURGERY      Current Medications: No outpatient medications have been marked as taking for the 10/14/24 encounter (Appointment) with Vannie Reche RAMAN, NP.     Allergies:   Amlodipine    Social History   Socioeconomic History   Marital status: Divorced    Spouse name: Not on file   Number of children: Not on file   Years of education: Not on file   Highest education level: GED or equivalent  Occupational History   Not on file  Tobacco Use   Smoking status: Every [place]  Current packs/[place]: 1.50    Average packs/[place]: 1.5 packs/[place] for 15.0 years (22.5 ttl pk-yrs)    Types: Cigarettes   Smokeless tobacco: Never   Tobacco comments:    increased to 2ppd  Vaping Use   Vaping status: Never Used  Substance and Sexual Activity   Alcohol use: No    Comment: former   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    Types: Fentanyl , Heroin, Hydrocodone , Hydromorphone , Morphine , Oxycodone     Comment: Heroin-just used 3 days ago   Sexual activity: Not Currently  Other Topics Concern   Not on file  Social History Narrative   Not on file   Social Drivers of Health    Financial Resource Strain: High Risk (01/10/2024)   Overall Financial Resource Strain (CARDIA)    Difficulty of Paying Living Expenses: Hard  Food Insecurity: No Food Insecurity (10/09/2024)   Hunger Vital Sign    Worried About Running Out of Food in the Last Year: Never true    Ran Out of Food in the Last Year: Never true  Recent Concern: Food Insecurity - Food Insecurity Present (08/28/2024)   Hunger Vital Sign    Worried About Running Out of Food in the Last Year: Sometimes true    Ran Out of Food in the Last Year: Sometimes true  Transportation Needs: Unmet Transportation Needs (10/09/2024)   PRAPARE - Transportation    Lack of Transportation (Medical): Yes    Lack of Transportation (Non-Medical): Yes  Physical Activity: Unknown (01/10/2024)   Exercise Vital Sign    Days of Exercise per Week: 0 days    Minutes of Exercise per Session: Not on file  Stress: Stress Concern Present (01/10/2024)   [employer] of Occupational Health - Occupational Stress Questionnaire    Feeling of Stress : Rather much  Social Connections: Socially Isolated (01/10/2024)   Social Connection and Isolation Panel    Frequency of Communication with Friends and Family: Once a week    Frequency of Social Gatherings with Friends and Family: Never    Attends Religious Services: Never    Database Administrator or Organizations: No    Attends Engineer, Structural: Not on file    Marital Status: Divorced     Family History: The patient's family history includes Cancer in his mother; Depression in his sister; Heart failure in his mother; Hypertension in his father and mother.  ROS:   Please see the history of present illness.     All other systems reviewed and are negative.  EKGs/Labs/Other Studies Reviewed:         Recent Labs: 07/01/2024: B Natriuretic Peptide 28.7 07/19/2024: ALT 9; BUN 14; Creat 0.93; Hemoglobin 11.9; Magnesium  2.0; Platelets 285; Potassium 3.9; Sodium 138; TSH 1.84    Recent Lipid Panel    Component Value Date/Time   CHOL 156 03/06/2024 1510   TRIG 70 03/06/2024 1510   HDL 58 03/06/2024 1510   CHOLHDL 2.7 03/06/2024 1510   VLDL 24 09/19/2015 0250   LDLCALC 83 03/06/2024 1510   LDLDIRECT 75 01/14/2013 0907    Physical Exam:   VS:  There were no vitals taken for this visit. , BMI There is no height or weight on file to calculate BMI. GENERAL:  Well appearing, overweight HEENT: Pupils equal round and reactive, fundi not visualized, oral mucosa unremarkable NECK:  No jugular venous distention, waveform within normal limits, carotid upstroke brisk and symmetric, no bruits, no thyromegaly LYMPHATICS:  No cervical adenopathy LUNGS:  Clear to auscultation  bilaterally HEART:  RRR.  PMI not displaced or sustained,S1 and S2 within normal limits, no S3, no S4, no clicks, no rubs, no murmurs ABD:  ***Flat, positive bowel sounds normal in frequency in pitch, no bruits, no rebound, no guarding, no midline pulsatile mass, no hepatomegaly, no splenomegaly EXT:  2 plus pulses throughout, no edema, no cyanosis no clubbing SKIN:  No rashes no nodules NEURO:  Cranial nerves II through XII grossly intact, motor grossly intact throughout PSYCH:  Cognitively intact, oriented to person place and time   ASSESSMENT/PLAN:    HTN -BP not at goal less than 130/80.  Stop losartan  start valsartan  320 mg daily. Continue Nebivolol  5mg  daily (defer increasing dose due to bradycardia. Continue Clonidine  0.1mg  TID.  Reports he was told by prior substance use provider to remain on clonidine , encouraged him to ask his methadone  clinic provider whether it would be appropriate to reduce clonidine  as may be contributory to fatigue.   BMP today and BMP and renin-aldosterone in 7 to 10 days. Plan for renal artery duplex to rule out stenosis. Plan for sleep study, as below.***  ?Heart failure -reports previous diagnosis.  Updated echocardiogram already ordered by PCP.  If reduced LVEF  noted or diastolic dysfunction plan to optimize GDMT.  Euvolemic on exam.***  Hx of CVA / HLD, LDL goal <70 - Not presently on statin. Address at follow up. Per chart notes previously declined. If intolerant, consider PCSK9i***  Hx of PE - On long term OAC managed by PCP. ***  PVD/Lymphedema/Varicose veins -denies claudication symptoms. Follows with  vein and vascular.***  Daytime somnolence-plan for [place] home sleep study***  Screening for Secondary Hypertension:     Relevant Labs/Studies:    Latest Ref Rng & Units 07/19/2024   10:41 AM 07/06/2024    4:07 AM 07/05/2024    4:12 AM  Basic Labs  Sodium 135 - 146 mmol/L 138  136  135   Potassium 3.5 - 5.3 mmol/L 3.9  3.7  3.2   Creatinine 0.70 - 1.35 mg/dL 9.06  9.03  8.81        Latest Ref Rng & Units 07/19/2024   10:41 AM 07/01/2024    2:30 PM  Thyroid    TSH 0.40 - 4.50 mIU/L 1.84  1.609              Disposition:    FU with MD/APP/PharmD in ***   Medication Adjustments/Labs and Tests Ordered: Current medicines are reviewed at length with the patient today.  Concerns regarding medicines are outlined above.  No orders of the defined types were placed in this encounter.  No orders of the defined types were placed in this encounter.    Signed, Reche GORMAN Finder, NP  10/14/2024 10:13 AM    [facility]

## 2024-10-14 NOTE — Telephone Encounter (Signed)
 Agree with plan, thank you for calling him and advising to send to ED.  Ahriana Gunkel S Evanny Ellerbe, NP

## 2024-10-14 NOTE — Telephone Encounter (Signed)
 Spoke with patient and rescheduled for 12/12 Patient just started back Valsartan  today  He apologized for missing visit this am Stated he fell yesterday and hit his head, this am has had vomiting and headache Advised patient he needed to go to ED for evaluation, agreed he did  Before ending conversation, advised again needed to go to ED for evaluation of head injury

## 2024-10-14 NOTE — Telephone Encounter (Signed)
 Pt no-showed his office visit today.   Recommend call to offer follow up visit 12/9 at 1p, 12/12 at 1p, 12/12 at 2:45p  Additionally please inquire on BP readings since resuming Valsartan  160mg  daily.  If he has not resumed and BP >130/80, needs to start Valsartan  160mg  daily If he has resumed Valsartan  and BP... BP >150/80, add Hydralazine  50mg  TID if SBP 130-150 add Hydralazine  25mg  TID.   Brian Zeitlin S [place], NP

## 2024-10-15 ENCOUNTER — Encounter (HOSPITAL_BASED_OUTPATIENT_CLINIC_OR_DEPARTMENT_OTHER): Admitting: Internal Medicine

## 2024-10-16 ENCOUNTER — Encounter (HOSPITAL_BASED_OUTPATIENT_CLINIC_OR_DEPARTMENT_OTHER): Admitting: Internal Medicine

## 2024-10-16 DIAGNOSIS — I87333 Chronic venous hypertension (idiopathic) with ulcer and inflammation of bilateral lower extremity: Secondary | ICD-10-CM | POA: Diagnosis not present

## 2024-10-17 DIAGNOSIS — Z7689 Persons encountering health services in other specified circumstances: Secondary | ICD-10-CM | POA: Diagnosis not present

## 2024-10-18 ENCOUNTER — Ambulatory Visit (HOSPITAL_BASED_OUTPATIENT_CLINIC_OR_DEPARTMENT_OTHER): Admitting: Family

## 2024-10-18 ENCOUNTER — Encounter (HOSPITAL_BASED_OUTPATIENT_CLINIC_OR_DEPARTMENT_OTHER): Payer: Self-pay | Admitting: Family

## 2024-10-18 ENCOUNTER — Other Ambulatory Visit (HOSPITAL_BASED_OUTPATIENT_CLINIC_OR_DEPARTMENT_OTHER): Payer: Self-pay

## 2024-10-18 VITALS — BP 184/94 | HR 76 | Ht 71.0 in | Wt 276.7 lb

## 2024-10-18 DIAGNOSIS — I10 Essential (primary) hypertension: Secondary | ICD-10-CM | POA: Diagnosis not present

## 2024-10-18 DIAGNOSIS — I890 Lymphedema, not elsewhere classified: Secondary | ICD-10-CM | POA: Diagnosis not present

## 2024-10-18 DIAGNOSIS — R296 Repeated falls: Secondary | ICD-10-CM | POA: Diagnosis not present

## 2024-10-18 DIAGNOSIS — Z7689 Persons encountering health services in other specified circumstances: Secondary | ICD-10-CM | POA: Diagnosis not present

## 2024-10-18 DIAGNOSIS — Z8673 Personal history of transient ischemic attack (TIA), and cerebral infarction without residual deficits: Secondary | ICD-10-CM

## 2024-10-18 MED ORDER — HYDRALAZINE HCL 50 MG PO TABS: 50.0000 mg | ORAL_TABLET | Freq: Two times a day (BID) | ORAL | 2 refills | Status: DC

## 2024-10-18 MED ORDER — HYDRALAZINE HCL 50 MG PO TABS
50.0000 mg | ORAL_TABLET | Freq: Two times a day (BID) | ORAL | 2 refills | Status: DC
  Filled 2024-10-18: qty 60, 30d supply, fill #0

## 2024-10-18 NOTE — Progress Notes (Signed)
 Advanced Hypertension Clinic Assessment:    Date:  10/18/2024   ID:  [name] [name], DOB [birth year], MRN [id number]  PCP:  [name], FNP  Cardiologist:  None  Nephrologist:  Referring MD: [name], FNP   CC: Hypertension  History of Present Illness:    [name] [name] is a 63 y.o. male with a hx of hypertension, COPD, CVA 2016, HLD, substance abuse, lymphedema. Here to follow up in the Advanced Hypertension Clinic.   Reports prior history of heart failure. Echo 09/2015 normal LVEF 55-60%, no RWMA, mildly calcified aortic valve leaflets with no regurgitation or stenosis, diastolic function not commented upon.  Established with Advanced Hypertension Clinic 03/2024. Diagnosed with hypertension late 30s and early 56s. Reported tobacco use 1.5 PPD and had been gradually reducing from 3 PPD. He had been in recovery for 8 months with assistance of methadone .  Losartan  stopped and transition to valsartan  320 mg daily.  Nebivolol  5 mg daily continued.  Clonidine  0.1 mg 3 times daily continued.  Was encouraged to ask his methadone  clinic provider whether he could discontinue.  Renal artery duplex ordered but not completed. Renin aldosterone ratio ordered but not collected.  Sleep today with OSA recommended for CPAP titration for consideration of BiPAP.  Admitted 06/2024 with bilateral lower extremity cellulitis, lymphedema.SABRA Antihypertensive regimen discontinued.  Echo during admission with LVEF unable to be assessed due to poor windows and poor visualization, grade 1 diastolic dysfunction, no significant valvular abnormalities.  His pain clinic contacted cardiology 10/11/24 noting markedly elevated BP, Valsartan  160mg  daily started.   Talked with nurse 10/14/24 after missing his appointment and noted fell yesterday, hit head, vomiting and headache. He was recommended to proceed to ED for evaluation.  Presents today for follow up independently. His first dose of the Valsartan  was  yesterday. He had trouble getting pharmacy. Does not drive as does not have a valid drivers license. He does use Medicaid transportation to get to his clinic visits most often.   Reports falling asleep out of nowhere. Reports he fell 6x this week. He is uncertain if he passed out. His roommate told him it was like he was walking to his bedroom and quit walking. No lightheadness nor dizziness. He reports despite previously recommended did not go to ED as could not get a ride and did not want to call 911.   Concerns aout his lymphedema. Does not feel addressed completely by wound clinic though they are helping to wrap his legs. Using lymphedema pumps twice per [place].   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   onset 02/14/14   Opioid abuse (HCC)    PE (pulmonary embolism)    DVT left leg   Positive colorectal cancer screening using Cologuard test 04/19/2019   Status post spinal surgery    Stroke [facility])  Substance abuse (HCC)    Substance use disorder 10/05/2022   Tobacco abuse     Past Surgical History:  Procedure Laterality Date   SPINAL FIXATION SURGERY     cervical   SPINE SURGERY      Current Medications: Current Meds  Medication Sig   albuterol  (VENTOLIN  HFA) 108 (90 Base) MCG/ACT inhaler Inhale 2 puffs into the lungs every 6 (six) hours as needed for wheezing or shortness of  breath.   DULoxetine  (CYMBALTA ) 30 MG capsule Take 1 capsule (30 mg total) by mouth daily.   furosemide  (LASIX ) 40 MG tablet Take 1 tablet (40 mg total) by mouth in the morning.   gabapentin  (NEURONTIN ) 100 MG capsule Take 2 capsules (200 mg total) by mouth 3 (three) times daily.   loratadine  (CLARITIN ) 10 MG tablet Take 1 tablet (10 mg total) by mouth daily.   methadone  (DOLOPHINE ) 10 MG/ML solution Take 135 mg by mouth daily.   Multiple Vitamins-Minerals (ONE-A-[place] PROACTIVE 65+) TABS Take 2 tablets by mouth daily.   rivaroxaban  (XARELTO ) 20 MG TABS tablet Take 1 tablet (20 mg total) by mouth daily with supper.   valsartan  (DIOVAN ) 160 MG tablet Take 1 tablet (160 mg total) by mouth daily.     Allergies:   Amlodipine    Social History   Socioeconomic History   Marital status: Divorced    Spouse name: Not on file   Number of children: Not on file   Years of education: Not on file   Highest education level: GED or equivalent  Occupational History   Not on file  Tobacco Use   Smoking status: Every [place]    Current packs/[place]: 1.50    Average packs/[place]olin: 1.5 packs/[place] for 15.0 years (22.5 ttl pk-yrs)    Types: Cigarettes   Smokeless tobacco: Never   Tobacco comments:    increased to 1.5 pk/[place]  Vaping Use   Vaping status: Never Used  Substance and Sexual Activity   Alcohol use: No    Comment: former   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    Types: Fentanyl , Heroin, Hydrocodone , Hydromorphone , Morphine , Oxycodone     Comment: Heroin-just used 3 days ago   Sexual activity: Not Currently  Other Topics Concern   Not on file  Social History Narrative   Not on file   Social Drivers of Health   Tobacco Use: High Risk (10/18/2024)   Patient History    Smoking Tobacco Use: Every [place]    Smokeless Tobacco Use: Never    Passive Exposure: Not on file  Financial Resource Strain: High Risk (01/10/2024)   Overall Financial Resource Strain (CARDIA)    Difficulty of Paying Living Expenses: Hard  Food  Insecurity: No Food Insecurity (10/09/2024)   Epic    Worried About Programme Researcher, Broadcasting/film/video in the Last Year: Never true    Ran Out of Food in the Last Year: Never true  Recent Concern: Food Insecurity - Food Insecurity Present (08/28/2024)   Epic    Worried About Programme Researcher, Broadcasting/film/video in the Last Year: Sometimes true    Ran Out of Food in the Last Year: Sometimes true  Transportation Needs: Unmet Transportation Needs (10/09/2024)   Epic    Lack of Transportation (Medical): Yes    Lack of Transportation (Non-Medical): Yes  Physical Activity: Unknown (01/10/2024)   Exercise Vital Sign    Days of Exercise per Week: 0 days    Minutes of Exercise per Session: Not on file  Stress: Stress Concern Present (01/10/2024)  [employer] of Occupational Health - Occupational Stress Questionnaire    Feeling of Stress : Rather much  Social Connections: Socially Isolated (01/10/2024)   Social Connection and Isolation Panel    Frequency of Communication with Friends and Family: Once a week    Frequency of Social Gatherings with Friends and Family: Never    Attends Religious Services: Never    Database Administrator or Organizations: No    Attends Engineer, Structural: Not on file    Marital Status: Divorced  Depression (PHQ2-9): High Risk (07/22/2024)   Depression (PHQ2-9)    PHQ-2 Score: 17  Alcohol Screen: Not on file  Housing: Unknown (10/09/2024)   Epic    Unable to Pay for Housing in the Last Year: No    Number of Times Moved in the Last Year: Not on file    Homeless in the Last Year: No  Utilities: Not At Risk (10/09/2024)   Epic    Threatened with loss of utilities: No  Health Literacy: Not on file     Family History: The patient's family history includes Cancer in his mother; Depression in his sister; Heart failure in his mother; Hypertension in his father and mother.  ROS:   Please see the history of present illness.     All other systems reviewed and are  negative.  EKGs/Labs/Other Studies Reviewed:         Recent Labs: 07/01/2024: B Natriuretic Peptide 28.7 07/19/2024: ALT 9; BUN 14; Creat 0.93; Hemoglobin 11.9; Magnesium  2.0; Platelets 285; Potassium 3.9; Sodium 138; TSH 1.84   Recent Lipid Panel    Component Value Date/Time   CHOL 156 03/06/2024 1510   TRIG 70 03/06/2024 1510   HDL 58 03/06/2024 1510   CHOLHDL 2.7 03/06/2024 1510   VLDL 24 09/19/2015 0250   LDLCALC 83 03/06/2024 1510   LDLDIRECT 75 01/14/2013 0907    Physical Exam:   VS:  BP (!) 184/94   Pulse 76   Ht 5' 11 (1.803 m)   Wt 276 lb 11.2 oz (125.5 kg)   SpO2 90%   BMI 38.59 kg/m  , BMI Body mass index is 38.59 kg/m. GENERAL:  Well appearing, overweight HEENT: Pupils equal round and reactive, fundi not visualized, oral mucosa unremarkable NECK:  No jugular venous distention, waveform within normal limits, carotid upstroke brisk and symmetric, no bruits, no thyromegaly LYMPHATICS:  No cervical adenopathy LUNGS:  Clear to auscultation bilaterally HEART:  RRR.  PMI not displaced or sustained,S1 and S2 within normal limits, no S3, no S4, no clicks, no rubs, no murmurs ABD:  Flat, positive bowel sounds normal in frequency in pitch, no bruits, no rebound, no guarding, no midline pulsatile mass, no hepatomegaly, no splenomegaly EXT:  2 plus pulses throughout, no edema, no cyanosis no clubbing SKIN:  No rashes no nodules NEURO:  Cranial nerves II through XII grossly intact, motor grossly intact throughout PSYCH:  Cognitively intact, oriented to person place and time   ASSESSMENT/PLAN:    Frequent falls -was previously advised to proceed with ED evaluation but he did not have a ride and did not feel symptoms warranted call to 911  If recurs encouraged to present to ED for evaluation.  CMP to rule out renal or liver dysfunction. CBC to rule out anemia. Untreated sleep apnea likely contributory.   HTN -BP not at goal less than 130/80.  Just resumed valsartan  160 mg  yesterday.  Encouraged if he has difficulty getting meds in the pharmacy  in the future to get through [facility] pharmacy for delivery.  Continue valsartan  160 mg daily.  Add hydralazine  50 mg twice daily.  BMP today. Discussed to monitor BP at home at least 2 hours after medications and sitting for 5-10 minutes.  Secondary hypertension workup Renal artery duplex previously ordered but not performed.  Will schedule at follow-up. Management of sleep apnea, as below.  Pending CPAP titration. Plan for renin-aldosterone ratio at follow up   ?Heart failure -reports previous diagnosis.  Echo during admission 06/2024 unable to determine LVEF, poor image quality. No HF symptoms.  Euvolemic on exam.  No further workup at this time due to need to optimize BP control.  Hx of CVA / HLD, LDL goal <70 - Not presently on statin. Address at follow up. Per chart notes previously declined. If intolerant, consider PCSK9i  Hx of PE - On long term OAC managed by PCP.   PVD/Lymphedema/Varicose veins -following with wound clinic.  Referred to [facility] for further management of lymphedema .  Encouraged to continue using lymphedema pumps twice per [place].  OSA-awaiting CPAP titration, I provided him the phone number to call to schedule.  Anticipate his bouts of falling asleep suddenly may be related to untreated OSA.  Screening for Secondary Hypertension:     Relevant Labs/Studies:    Latest Ref Rng & Units 07/19/2024   10:41 AM 07/06/2024    4:07 AM 07/05/2024    4:12 AM  Basic Labs  Sodium 135 - 146 mmol/L 138  136  135   Potassium 3.5 - 5.3 mmol/L 3.9  3.7  3.2   Creatinine 0.70 - 1.35 mg/dL 9.06  9.03  8.81        Latest Ref Rng & Units 07/19/2024   10:41 AM 07/01/2024    2:30 PM  Thyroid    TSH 0.40 - 4.50 mIU/L 1.84  1.609              Disposition:    FU with MD/APP/PharmD in 2 mos   Medication Adjustments/Labs and Tests Ordered: Current medicines are reviewed at length with the patient today.  Concerns  regarding medicines are outlined above.  No orders of the defined types were placed in this encounter.  No orders of the defined types were placed in this encounter.  Signed, Reche GORMAN Finder, NP  10/18/2024 1:06 PM    [facility]

## 2024-10-18 NOTE — Patient Instructions (Addendum)
 Medication Instructions:   Continue Valsartan  160mg  daily  START Hydralazine  50mg  twice per [place]   *If you need a refill on your cardiac medications before your next appointment, please call your pharmacy*  Lab Work: Your physician recommends that you return for lab work today: BMET, CBC  If you have labs (blood work) drawn today and your tests are completely normal, you will receive your results only by: MyChart Message (if you have MyChart) OR A paper copy in the mail If you have any lab test that is abnormal or we need to change your treatment, we will call you to review the results.  Follow-Up:  Your next appointment:   In Advanced Hypertension Clinic in 2 months    Other Instructions  Tips to Measure your Blood Pressure Correctly  Here's what you can do to ensure a correct reading:  Don't drink a caffeinated beverage or smoke during the 30 minutes before the test.  Sit quietly for five minutes before the test begins.  During the measurement, sit in a chair with your feet on the floor and your arm supported so your elbow is at about heart level.  The inflatable part of the cuff should completely cover at least 80% of your upper arm, and the cuff should be placed on bare skin, not over a shirt.  Don't talk during the measurement.  Have your blood pressure measured twice, with a brief break in between. If the readings are different by 5 points or more, have it done a third time.    For Medicaid transport: [employer] medicaid number is [id number]- recommend say agent until you get a to talk to a human being   Recommend calling to schedule CPAP titration: [facility] [address] [place],  [place]  [address] Main: [phone]

## 2024-10-22 ENCOUNTER — Emergency Department (HOSPITAL_COMMUNITY)

## 2024-10-22 ENCOUNTER — Other Ambulatory Visit: Payer: Self-pay

## 2024-10-22 ENCOUNTER — Emergency Department (HOSPITAL_COMMUNITY)
Admission: EM | Admit: 2024-10-22 | Discharge: 2024-10-22 | Discharge status: Against Medical Advice | Source: Home / Self Care | Attending: Emergency Medicine | Admitting: Emergency Medicine

## 2024-10-22 ENCOUNTER — Ambulatory Visit: Payer: Self-pay | Admitting: Diagnostic Neuroimaging

## 2024-10-22 ENCOUNTER — Encounter: Payer: Self-pay | Admitting: Diagnostic Neuroimaging

## 2024-10-22 DIAGNOSIS — Z86711 Personal history of pulmonary embolism: Secondary | ICD-10-CM | POA: Diagnosis not present

## 2024-10-22 DIAGNOSIS — T148XXA Other injury of unspecified body region, initial encounter: Secondary | ICD-10-CM

## 2024-10-22 DIAGNOSIS — Z87891 Personal history of nicotine dependence: Secondary | ICD-10-CM | POA: Diagnosis not present

## 2024-10-22 DIAGNOSIS — Z7689 Persons encountering health services in other specified circumstances: Secondary | ICD-10-CM | POA: Diagnosis not present

## 2024-10-22 DIAGNOSIS — R079 Chest pain, unspecified: Secondary | ICD-10-CM

## 2024-10-22 DIAGNOSIS — Y92002 Bathroom of unspecified non-institutional (private) residence single-family (private) house as the place of occurrence of the external cause: Secondary | ICD-10-CM | POA: Diagnosis not present

## 2024-10-22 DIAGNOSIS — Z5329 Procedure and treatment not carried out because of patient's decision for other reasons: Secondary | ICD-10-CM | POA: Diagnosis not present

## 2024-10-22 DIAGNOSIS — R0789 Other chest pain: Secondary | ICD-10-CM | POA: Diagnosis not present

## 2024-10-22 DIAGNOSIS — D500 Iron deficiency anemia secondary to blood loss (chronic): Secondary | ICD-10-CM | POA: Diagnosis not present

## 2024-10-22 DIAGNOSIS — M549 Dorsalgia, unspecified: Secondary | ICD-10-CM | POA: Diagnosis not present

## 2024-10-22 DIAGNOSIS — I10 Essential (primary) hypertension: Secondary | ICD-10-CM | POA: Diagnosis not present

## 2024-10-22 DIAGNOSIS — R55 Syncope and collapse: Secondary | ICD-10-CM

## 2024-10-22 DIAGNOSIS — W0110XA Fall on same level from slipping, tripping and stumbling with subsequent striking against unspecified object, initial encounter: Secondary | ICD-10-CM | POA: Diagnosis not present

## 2024-10-22 DIAGNOSIS — R296 Repeated falls: Secondary | ICD-10-CM

## 2024-10-22 DIAGNOSIS — M545 Low back pain, unspecified: Secondary | ICD-10-CM | POA: Diagnosis not present

## 2024-10-22 DIAGNOSIS — Z79899 Other long term (current) drug therapy: Secondary | ICD-10-CM | POA: Diagnosis not present

## 2024-10-22 DIAGNOSIS — J449 Chronic obstructive pulmonary disease, unspecified: Secondary | ICD-10-CM | POA: Diagnosis not present

## 2024-10-22 DIAGNOSIS — Z8673 Personal history of transient ischemic attack (TIA), and cerebral infarction without residual deficits: Secondary | ICD-10-CM | POA: Diagnosis not present

## 2024-10-22 DIAGNOSIS — R109 Unspecified abdominal pain: Secondary | ICD-10-CM | POA: Diagnosis not present

## 2024-10-22 DIAGNOSIS — I890 Lymphedema, not elsewhere classified: Secondary | ICD-10-CM | POA: Diagnosis not present

## 2024-10-22 DIAGNOSIS — Z7901 Long term (current) use of anticoagulants: Secondary | ICD-10-CM | POA: Diagnosis not present

## 2024-10-22 LAB — CBC WITH DIFFERENTIAL/PLATELET
Abs Immature Granulocytes: 0.03 K/uL (ref 0.00–0.07)
Basophils Absolute: 0.1 K/uL (ref 0.0–0.1)
Basophils Relative: 1 %
Eosinophils Absolute: 0.3 K/uL (ref 0.0–0.5)
Eosinophils Relative: 3 %
HCT: 35.4 % — ABNORMAL LOW (ref 39.0–52.0)
Hemoglobin: 11.3 g/dL — ABNORMAL LOW (ref 13.0–17.0)
Immature Granulocytes: 0 %
Lymphocytes Relative: 25 %
Lymphs Abs: 2.1 K/uL (ref 0.7–4.0)
MCH: 30.5 pg (ref 26.0–34.0)
MCHC: 31.9 g/dL (ref 30.0–36.0)
MCV: 95.4 fL (ref 80.0–100.0)
Monocytes Absolute: 0.5 K/uL (ref 0.1–1.0)
Monocytes Relative: 6 %
Neutro Abs: 5.2 K/uL (ref 1.7–7.7)
Neutrophils Relative %: 65 %
Platelets: 254 K/uL (ref 150–400)
RBC: 3.71 MIL/uL — ABNORMAL LOW (ref 4.22–5.81)
RDW: 15.3 % (ref 11.5–15.5)
WBC: 8.2 K/uL (ref 4.0–10.5)
nRBC: 0 % (ref 0.0–0.2)

## 2024-10-22 LAB — BASIC METABOLIC PANEL WITH GFR
Anion gap: 9 (ref 5–15)
BUN: 11 mg/dL (ref 8–23)
CO2: 24 mmol/L (ref 22–32)
Calcium: 7.9 mg/dL — ABNORMAL LOW (ref 8.9–10.3)
Chloride: 104 mmol/L (ref 98–111)
Creatinine, Ser: 0.6 mg/dL — ABNORMAL LOW (ref 0.61–1.24)
GFR, Estimated: >60 mL/min (ref >60)
Glucose, Bld: 94 mg/dL (ref 70–99)
Potassium: 3.9 mmol/L (ref 3.5–5.1)
Sodium: 137 mmol/L (ref 135–145)

## 2024-10-22 LAB — BRAIN NATRIURETIC PEPTIDE: B Natriuretic Peptide: 77.7 pg/mL (ref 0.0–100.0)

## 2024-10-22 LAB — MAGNESIUM: Magnesium: 1.9 mg/dL (ref 1.7–2.4)

## 2024-10-22 LAB — TSH: TSH: 1.28 u[IU]/mL (ref 0.350–4.500)

## 2024-10-22 LAB — TROPONIN I (HIGH SENSITIVITY): Troponin I (High Sensitivity): 12 ng/L (ref <18)

## 2024-10-22 NOTE — ED Provider Notes (Signed)
 [place] EMERGENCY DEPARTMENT AT [facility] Provider Note   CSN: [id number] Arrival date & time: 10/22/24  9249     Patient presents with: Fall, Arm Pain, Headache, Near Syncope, Shoulder Pain, and Neck Pain   Mozell Hardacre Etherington is a 63 y.o. male.   Iver is a 63 year old male with past medical history significant for PE (Xarelto  40 mg daily), hypertension, COPD, CVA 2016, HLD, history of substance abuse (8 months on methadone . Currently 130 mg, previously 140 mg last week), lymphedema, history of tobacco use 1.5 PPD. Was seen by cardiology 12/12 who recommended ED evaluation for recurrent falls.  Patient was unable to get to the ED due to transportation issues.  Presents to the ED for recurrent falls, neck pain, abdominal pain.  States that he has fallen about 6 times this week.  1 fall included hitting head in bathroom.  Patient denies any prodromal symptoms such as dizziness, lightheadedness, sweating, palpitations.  Endorses loss of consciousness during the falls.  He stated I'll just be standing there or walking then find myself on the floor.  Denies shaking, incontinent episode, postictal state.  Initially believed that falls could be due to methadone  dosing - previously on 140 mg dosing and 1 week ago was changed to 130 mg dosing.  Did not fall 10/21/2024 after taking methadone , however did have a fall 10/22/2024 before taking any methadone .  Last dose of Xarelto  10/21/2024.       Fall Associated symptoms include abdominal pain, headaches and shortness of breath. Pertinent negatives include no chest pain.  Arm Pain Associated symptoms include abdominal pain, headaches and shortness of breath. Pertinent negatives include no chest pain.  Headache Associated symptoms: abdominal pain, cough, near-syncope and neck pain   Associated symptoms: no diarrhea, no dizziness, no fatigue, no fever, no nausea, no numbness, no seizures, no vomiting and no weakness   Near  Syncope Associated symptoms include abdominal pain, headaches and shortness of breath. Pertinent negatives include no chest pain.  Shoulder Pain Associated symptoms: neck pain   Associated symptoms: no fatigue and no fever   Neck Pain Associated symptoms: headaches   Associated symptoms: no chest pain, no fever, no numbness and no weakness        Prior to Admission medications  Medication Sig Start Date End Date Taking? Authorizing Provider  albuterol  (VENTOLIN  HFA) 108 (90 Base) MCG/ACT inhaler Inhale 2 puffs into the lungs every 6 (six) hours as needed for wheezing or shortness of breath. 09/20/24   [name], FNP  DULoxetine  (CYMBALTA ) 30 MG capsule Take 1 capsule (30 mg total) by mouth daily. 09/20/24 10/20/24  [name], FNP  furosemide  (LASIX ) 40 MG tablet Take 1 tablet (40 mg total) by mouth in the morning. 09/20/24 10/20/24  [name], FNP  gabapentin  (NEURONTIN ) 100 MG capsule Take 2 capsules (200 mg total) by mouth 3 (three) times daily. 09/20/24 10/20/24  Duanne Butler DASEN, MD  hydrALAZINE  (APRESOLINE ) 50 MG tablet Take 1 tablet (50 mg total) by mouth 2 (two) times daily. 10/18/24   Vannie Reche RAMAN, NP  loratadine  (CLARITIN ) 10 MG tablet Take 1 tablet (10 mg total) by mouth daily. 09/20/24   [name], FNP  methadone  (DOLOPHINE ) 10 MG/ML solution Take 135 mg by mouth daily.    [provider]  Multiple Vitamins-Minerals (ONE-A-Littler PROACTIVE 65+) TABS Take 2 tablets by mouth daily.    [provider]  rivaroxaban  (XARELTO ) 20 MG TABS tablet Take 1 tablet (20  mg total) by mouth daily with supper. 09/20/24   [name], FNP  valsartan  (DIOVAN ) 160 MG tablet Take 1 tablet (160 mg total) by mouth daily. 10/11/24   Vannie Reche RAMAN, NP    Allergies: Amlodipine     Review of Systems  Constitutional:  Negative for chills, fatigue and fever.  Respiratory:  Positive for cough and shortness of breath.   Cardiovascular:  Positive for leg  swelling (Chronic, lymphedema) and near-syncope. Negative for chest pain and palpitations.  Gastrointestinal:  Positive for abdominal pain. Negative for blood in stool, constipation, diarrhea, nausea and vomiting.  Genitourinary:  Positive for scrotal swelling (Chronic.). Negative for difficulty urinating, dysuria, frequency and hematuria.  Musculoskeletal:  Positive for neck pain.  Skin:        Bruising  Neurological:  Positive for headaches. Negative for dizziness, seizures, weakness, light-headedness and numbness.  Hematological:  Bruises/bleeds easily.  Psychiatric/Behavioral:  Negative for confusion.     Updated Vital Signs BP (!) 193/92   Pulse 73   Temp 97.8 F (36.6 C) (Oral)   Resp 18   SpO2 93%   Physical Exam Constitutional:      General: He is not in acute distress.    Appearance: He is obese. He is not ill-appearing or toxic-appearing.  HENT:     Mouth/Throat:     Mouth: Mucous membranes are moist.  Eyes:     General: No visual field deficit.    Extraocular Movements: Extraocular movements intact.     Pupils: Pupils are equal, round, and reactive to light.  Neck:     Comments: Full ROM right rotation, decreased left rotation (45 degree) Cardiovascular:     Rate and Rhythm: Normal rate and regular rhythm.     Heart sounds: Normal heart sounds. No murmur heard.    No friction rub. No gallop.  Pulmonary:     Effort: Pulmonary effort is normal.     Breath sounds: Normal breath sounds. No stridor. No wheezing, rhonchi or rales.  Chest:     Chest wall: Tenderness present.  Abdominal:     General: Bowel sounds are normal.     Palpations: Abdomen is soft.     Tenderness: There is abdominal tenderness. There is guarding.  Musculoskeletal:        General: Swelling present.     Cervical back: Rigidity present.     Comments: Wrapped bilaterally.  Skin:    General: Skin is warm and dry.     Comments: Presenting for.  Right side head (yellow), right upper extremity  (purple), left upper extremity (purple)  Neurological:     Mental Status: He is alert and oriented to person, place, and time.     Cranial Nerves: No cranial nerve deficit, dysarthria or facial asymmetry.     Sensory: No sensory deficit.     Motor: No weakness.     (all labs ordered are listed, but only abnormal results are displayed) Labs Reviewed  CBC WITH DIFFERENTIAL/PLATELET - Abnormal; Notable for the following components:      Result Value   RBC 3.71 (*)    Hemoglobin 11.3 (*)    HCT 35.4 (*)    All other components within normal limits  BASIC METABOLIC PANEL WITH GFR - Abnormal; Notable for the following components:   Creatinine, Ser 0.60 (*)    Calcium  7.9 (*)    All other components within normal limits  RESP PANEL BY RT-PCR (RSV, FLU A&B, COVID)  RVPGX2  BRAIN NATRIURETIC  PEPTIDE  MAGNESIUM   TSH  HEPATIC FUNCTION PANEL  LIPASE, BLOOD  TROPONIN I (HIGH SENSITIVITY)  TROPONIN I (HIGH SENSITIVITY)    EKG: EKG Interpretation Date/Time:  Tuesday October 22 2024 08:05:38 EST Ventricular Rate:  70 PR Interval:  152 QRS Duration:  80 QT Interval:  430 QTC Calculation: 464 R Axis:   -13  Text Interpretation: Normal sinus rhythm Minimal voltage criteria for LVH, may be normal variant ( R in aVL ) Cannot rule out Anterior infarct , age undetermined Abnormal ECG When compared with ECG of 01-Jul-2024 11:29, PREVIOUS ECG IS PRESENT Similar to previous Confirmed by Bari Flank [phone]) on 10/22/2024 8:29:48 AM  Radiology: No results found.   Procedures   Medications Ordered in the ED - No data to display                                  Medical Decision Making Left AMA / elopement. While awaiting further workup, patient was seen walking out of ED. When asked by Dr. Tegeler why he was leaving, he stated I would rather die. During examination prior to leaving, Dr. Ginger and myself explained risks of current presentation and requiring workup such as change or if  more falls/trauma. Patient stated understanding at that time.   With presentation of syncopal episodes with multiple falls on blood thinner including head trauma, abdominal pain, neck pain, and CP workup ordered included: BMP, BNP, CBC, hepatic function panel, lipase, magnesium , TSH, troponin, respiratory panel, CT L-spine, CT T-spine, CT abdomen, CT angio chest PE, CT angio head and neck, CT cervical spine. CBC: Normocytic anemia Hgb 11.3, BMP unremarkable, troponin 12, magnesium  1.9, BNP 77.7 remainder of exams were unable to be completed prior to leaving.   Amount and/or Complexity of Data Reviewed Labs: ordered. Radiology: ordered.       Final diagnoses:  Syncope, unspecified syncope type  Multiple falls  Bruising  Acute back pain, unspecified back location, unspecified back pain laterality  Chest pain, unspecified type  Abdominal pain, unspecified abdominal location    ED Discharge Orders     None          Benuel Braun, DO 10/22/24 1509    Tegeler, Lonni PARAS, MD 10/23/24 1600

## 2024-10-22 NOTE — ED Provider Triage Note (Signed)
 Emergency Medicine Provider Triage Evaluation Note  [name] [name] , a 63 y.o. male  was evaluated in triage.  Pt complains of multiple falls.  Last fall with head injury was about 5 days ago.  Patient ambulates with a walker at home.  Has difficulty ambulating secondary to chronic lymphedema.  States that this lymphedema and limitations in physical ability sometimes cause mechanical falls.  But more recently he states he will be walking and all of a sudden fall out.  He does lose consciousness.  He has hit his head, most recently 5 days ago.  He is on Eliquis.  Did not activate level 2 trauma due to time elapsed from last head injury.  Also endorses generalized weakness, intermittent chest pains, shortness of breath with wheezing.  Review of Systems  Positive: Fall, loss of consciousness, head injury, shortness of breath, chest pain/palpitations, lightheadedness Negative: Focal numbness/weakness, facial droop, speech difficulty, fever  Physical Exam  BP (!) 193/92   Pulse 73   Temp 97.8 F (36.6 C) (Oral)   Resp 18   SpO2 93%  Gen:   Awake, no distress  , chronic lymphedema Resp:  Normal effort, scattered wheezes MSK:   Moves extremities without difficulty, moves all 4 extremities Other:  No obvious signs of trauma  Medical Decision Making  Medically screening exam initiated at 8:26 AM.  Appropriate orders placed.  Toribio Pac Goatley was informed that the remainder of the evaluation will be completed by another provider, this initial triage assessment does not replace that evaluation, and the importance of remaining in the ED until their evaluation is complete.  63 year old male presents emergency department with multiple falls, possible syncope, head injury on anticoagulation, chest pain/palpitations, lightheadedness and shortness of breath.  Patient evaluated sitting up in chair, fully clothed, limited PE. Orders placed. Patient was counseled that they need to remain in the ED until the  completion of their work-up including a full H&P, additional testing and results of any tests.  The patient appears stable and the remainder of the encounter may be completed by another provider.   Bari Roxie HERO, [place] 10/22/24 9171

## 2024-10-22 NOTE — ED Triage Notes (Addendum)
 Pt. Stated, I have falling several times in the last 6 days ,. Im walking and just go out. Im having both arms hurt, head pain, shoulder pain, I take Xarelto  40mg . Per [facility] I think. Pt seen by DR. Walker 4 days ago and was advised to come to ED but couldn't find transportation or someone to watch his dogs.

## 2024-10-22 NOTE — ED Notes (Signed)
 Pt found standing at the end of the bed, this RN entered room to assist pt back into bed and pt refused to sit down. This RN explained that due to him coming in for multiple falls that he should have a seat. Pt refusing and yelling at this RN I just need 5 minutes alone, I am having a panic attack this Rn explained that due to pt being here for falls that I cannot leave him in the room standing alone. Pt became very upset and ripped off all monitoring cords and walked out of room to lobby. Dr Tegeler stopped pt in hallway and attempted to get pt to stay but pt refused and continued to walk out.

## 2024-10-23 ENCOUNTER — Other Ambulatory Visit: Payer: Self-pay | Admitting: Licensed Clinical Social Worker

## 2024-10-23 ENCOUNTER — Encounter (HOSPITAL_BASED_OUTPATIENT_CLINIC_OR_DEPARTMENT_OTHER): Admitting: General Surgery

## 2024-10-23 DIAGNOSIS — I890 Lymphedema, not elsewhere classified: Secondary | ICD-10-CM | POA: Diagnosis not present

## 2024-10-23 DIAGNOSIS — I87333 Chronic venous hypertension (idiopathic) with ulcer and inflammation of bilateral lower extremity: Secondary | ICD-10-CM | POA: Diagnosis not present

## 2024-10-23 DIAGNOSIS — L97812 Non-pressure chronic ulcer of other part of right lower leg with fat layer exposed: Secondary | ICD-10-CM | POA: Diagnosis not present

## 2024-10-23 DIAGNOSIS — L97822 Non-pressure chronic ulcer of other part of left lower leg with fat layer exposed: Secondary | ICD-10-CM | POA: Diagnosis not present

## 2024-10-23 DIAGNOSIS — R296 Repeated falls: Secondary | ICD-10-CM

## 2024-10-23 NOTE — Patient Instructions (Signed)
 Visit Information  Thank you for taking time to visit with me today. Please don't hesitate to contact me if I can be of assistance to you before our next scheduled appointment.  Your next care management appointment is by telephone on 11/12/2023 at 11:00 am    Please call the care guide team at [phone] if you need to cancel, schedule, or reschedule an appointment.   Please call the Suicide and Crisis Lifeline: 988 go to [facility] [address] [place] [phone]) call 911 if you are experiencing a Mental Health or Behavioral Health Crisis or need someone to talk to.  Tobias CHARM [name], PhD [facility], [facility] Social Worker Direct Dial: [phone]  Fax: [phone]

## 2024-10-23 NOTE — Patient Outreach (Signed)
 10/23/2024 SW called the patient today the patient was jsut in the the ER/Hospital and was jsut discharged on yesterday. And today he was not for talking an was trying to take a nap. SW will follow up with in a about 2 weeks.   Tobias CHARM [name], PhD [facility], [facility] Social Worker Direct Dial: [phone]  Fax: [phone]

## 2024-10-28 ENCOUNTER — Other Ambulatory Visit (HOSPITAL_BASED_OUTPATIENT_CLINIC_OR_DEPARTMENT_OTHER): Payer: Self-pay

## 2024-10-28 DIAGNOSIS — Z7689 Persons encountering health services in other specified circumstances: Secondary | ICD-10-CM | POA: Diagnosis not present

## 2024-10-29 DIAGNOSIS — Z7689 Persons encountering health services in other specified circumstances: Secondary | ICD-10-CM | POA: Diagnosis not present

## 2024-10-30 ENCOUNTER — Telehealth: Payer: Self-pay

## 2024-10-30 NOTE — Patient Instructions (Addendum)
 Toribio Pac Bath - I am sorry I was unable to reach you today for our scheduled appointment.   Your next care management appointment is by telephone on Friday, January 16 at 2:30 PM  Please call the care guide team at [phone] if you need to cancel, schedule, or reschedule an appointment.   Please call 1-800-273-TALK (toll free, 24 hour hotline) if you are experiencing a Mental Health or Behavioral Health Crisis or need someone to talk to.  Clayborne Ly RN BSN CCM [place]  [facility], [facility] Nurse Care Coordinator  Direct Dial: [phone] Website: [email or web address]

## 2024-11-01 ENCOUNTER — Encounter (HOSPITAL_BASED_OUTPATIENT_CLINIC_OR_DEPARTMENT_OTHER): Admitting: General Surgery

## 2024-11-04 ENCOUNTER — Encounter (HOSPITAL_BASED_OUTPATIENT_CLINIC_OR_DEPARTMENT_OTHER): Admitting: General Surgery

## 2024-11-04 ENCOUNTER — Telehealth: Payer: Self-pay | Admitting: Family Medicine

## 2024-11-04 DIAGNOSIS — I890 Lymphedema, not elsewhere classified: Secondary | ICD-10-CM | POA: Diagnosis not present

## 2024-11-04 DIAGNOSIS — L97812 Non-pressure chronic ulcer of other part of right lower leg with fat layer exposed: Secondary | ICD-10-CM | POA: Diagnosis not present

## 2024-11-04 DIAGNOSIS — I87333 Chronic venous hypertension (idiopathic) with ulcer and inflammation of bilateral lower extremity: Secondary | ICD-10-CM | POA: Diagnosis not present

## 2024-11-04 DIAGNOSIS — L97822 Non-pressure chronic ulcer of other part of left lower leg with fat layer exposed: Secondary | ICD-10-CM | POA: Diagnosis not present

## 2024-11-04 DIAGNOSIS — Z7689 Persons encountering health services in other specified circumstances: Secondary | ICD-10-CM | POA: Diagnosis not present

## 2024-11-04 NOTE — Telephone Encounter (Signed)
 Copied from CRM #[id number]. Topic: General - Other >> Oct 30, 2024 10:05 AM Hadassah PARAS wrote: Reason for CRM: Pt is calling returning missed call from :Little, Clayborne CROME, RN. Please cb pt on #[phone]

## 2024-11-11 ENCOUNTER — Emergency Department (HOSPITAL_COMMUNITY)

## 2024-11-11 ENCOUNTER — Other Ambulatory Visit: Payer: Self-pay

## 2024-11-11 ENCOUNTER — Inpatient Hospital Stay (HOSPITAL_COMMUNITY)
Admission: EM | Admit: 2024-11-11 | Discharge: 2024-11-13 | Disposition: A | Discharge status: Home / Self Care | Source: Home / Self Care | Attending: Internal Medicine | Admitting: Internal Medicine

## 2024-11-11 ENCOUNTER — Other Ambulatory Visit: Payer: Self-pay | Admitting: Licensed Clinical Social Worker

## 2024-11-11 ENCOUNTER — Encounter (HOSPITAL_COMMUNITY): Payer: Self-pay | Admitting: Internal Medicine

## 2024-11-11 DIAGNOSIS — S42032A Displaced fracture of lateral end of left clavicle, initial encounter for closed fracture: Secondary | ICD-10-CM | POA: Diagnosis present

## 2024-11-11 DIAGNOSIS — I110 Hypertensive heart disease with heart failure: Secondary | ICD-10-CM | POA: Diagnosis present

## 2024-11-11 DIAGNOSIS — S2232XA Fracture of one rib, left side, initial encounter for closed fracture: Secondary | ICD-10-CM | POA: Diagnosis present

## 2024-11-11 DIAGNOSIS — I890 Lymphedema, not elsewhere classified: Secondary | ICD-10-CM | POA: Diagnosis present

## 2024-11-11 DIAGNOSIS — F419 Anxiety disorder, unspecified: Secondary | ICD-10-CM

## 2024-11-11 DIAGNOSIS — I5030 Unspecified diastolic (congestive) heart failure: Secondary | ICD-10-CM | POA: Diagnosis present

## 2024-11-11 DIAGNOSIS — F411 Generalized anxiety disorder: Secondary | ICD-10-CM | POA: Diagnosis present

## 2024-11-11 DIAGNOSIS — Z8249 Family history of ischemic heart disease and other diseases of the circulatory system: Secondary | ICD-10-CM

## 2024-11-11 DIAGNOSIS — G4733 Obstructive sleep apnea (adult) (pediatric): Secondary | ICD-10-CM | POA: Diagnosis present

## 2024-11-11 DIAGNOSIS — G43909 Migraine, unspecified, not intractable, without status migrainosus: Secondary | ICD-10-CM | POA: Diagnosis present

## 2024-11-11 DIAGNOSIS — J449 Chronic obstructive pulmonary disease, unspecified: Secondary | ICD-10-CM | POA: Diagnosis present

## 2024-11-11 DIAGNOSIS — F17210 Nicotine dependence, cigarettes, uncomplicated: Secondary | ICD-10-CM | POA: Diagnosis present

## 2024-11-11 DIAGNOSIS — J4489 Other specified chronic obstructive pulmonary disease: Secondary | ICD-10-CM | POA: Diagnosis present

## 2024-11-11 DIAGNOSIS — I160 Hypertensive urgency: Secondary | ICD-10-CM | POA: Diagnosis present

## 2024-11-11 DIAGNOSIS — R55 Syncope and collapse: Principal | ICD-10-CM | POA: Diagnosis present

## 2024-11-11 DIAGNOSIS — Z86711 Personal history of pulmonary embolism: Secondary | ICD-10-CM

## 2024-11-11 DIAGNOSIS — Z6834 Body mass index (BMI) 34.0-34.9, adult: Secondary | ICD-10-CM

## 2024-11-11 DIAGNOSIS — I5032 Chronic diastolic (congestive) heart failure: Secondary | ICD-10-CM | POA: Diagnosis present

## 2024-11-11 DIAGNOSIS — R600 Localized edema: Secondary | ICD-10-CM

## 2024-11-11 DIAGNOSIS — W19XXXA Unspecified fall, initial encounter: Secondary | ICD-10-CM | POA: Diagnosis present

## 2024-11-11 DIAGNOSIS — Z7901 Long term (current) use of anticoagulants: Secondary | ICD-10-CM

## 2024-11-11 DIAGNOSIS — J439 Emphysema, unspecified: Secondary | ICD-10-CM | POA: Diagnosis present

## 2024-11-11 DIAGNOSIS — E785 Hyperlipidemia, unspecified: Secondary | ICD-10-CM | POA: Diagnosis present

## 2024-11-11 DIAGNOSIS — Z818 Family history of other mental and behavioral disorders: Secondary | ICD-10-CM

## 2024-11-11 DIAGNOSIS — F1110 Opioid abuse, uncomplicated: Secondary | ICD-10-CM | POA: Diagnosis present

## 2024-11-11 DIAGNOSIS — Z720 Tobacco use: Secondary | ICD-10-CM | POA: Diagnosis present

## 2024-11-11 DIAGNOSIS — J302 Other seasonal allergic rhinitis: Secondary | ICD-10-CM

## 2024-11-11 DIAGNOSIS — G629 Polyneuropathy, unspecified: Secondary | ICD-10-CM | POA: Diagnosis present

## 2024-11-11 DIAGNOSIS — R296 Repeated falls: Secondary | ICD-10-CM | POA: Diagnosis present

## 2024-11-11 DIAGNOSIS — F32A Depression, unspecified: Secondary | ICD-10-CM | POA: Diagnosis present

## 2024-11-11 DIAGNOSIS — Z86718 Personal history of other venous thrombosis and embolism: Secondary | ICD-10-CM

## 2024-11-11 DIAGNOSIS — E66811 Obesity, class 1: Secondary | ICD-10-CM | POA: Diagnosis present

## 2024-11-11 DIAGNOSIS — G894 Chronic pain syndrome: Secondary | ICD-10-CM | POA: Diagnosis present

## 2024-11-11 DIAGNOSIS — I10 Essential (primary) hypertension: Secondary | ICD-10-CM | POA: Diagnosis present

## 2024-11-11 DIAGNOSIS — Z888 Allergy status to other drugs, medicaments and biological substances status: Secondary | ICD-10-CM

## 2024-11-11 DIAGNOSIS — R9431 Abnormal electrocardiogram [ECG] [EKG]: Secondary | ICD-10-CM | POA: Diagnosis present

## 2024-11-11 DIAGNOSIS — Z79899 Other long term (current) drug therapy: Secondary | ICD-10-CM

## 2024-11-11 DIAGNOSIS — Z8673 Personal history of transient ischemic attack (TIA), and cerebral infarction without residual deficits: Secondary | ICD-10-CM

## 2024-11-11 DIAGNOSIS — K5903 Drug induced constipation: Secondary | ICD-10-CM | POA: Diagnosis present

## 2024-11-11 LAB — CBC WITH DIFFERENTIAL/PLATELET
Abs Immature Granulocytes: 0.03 K/uL (ref 0.00–0.07)
Basophils Absolute: 0 K/uL (ref 0.0–0.1)
Basophils Relative: 0 %
Eosinophils Absolute: 0.2 K/uL (ref 0.0–0.5)
Eosinophils Relative: 2 %
HCT: 33.8 % — ABNORMAL LOW (ref 39.0–52.0)
Hemoglobin: 11 g/dL — ABNORMAL LOW (ref 13.0–17.0)
Immature Granulocytes: 0 %
Lymphocytes Relative: 22 %
Lymphs Abs: 2.1 K/uL (ref 0.7–4.0)
MCH: 30 pg (ref 26.0–34.0)
MCHC: 32.5 g/dL (ref 30.0–36.0)
MCV: 92.1 fL (ref 80.0–100.0)
Monocytes Absolute: 0.5 K/uL (ref 0.1–1.0)
Monocytes Relative: 6 %
Neutro Abs: 6.6 K/uL (ref 1.7–7.7)
Neutrophils Relative %: 70 %
Platelets: 241 K/uL (ref 150–400)
RBC: 3.67 MIL/uL — ABNORMAL LOW (ref 4.22–5.81)
RDW: 15.5 % (ref 11.5–15.5)
WBC: 9.4 K/uL (ref 4.0–10.5)
nRBC: 0 % (ref 0.0–0.2)

## 2024-11-11 LAB — APTT: aPTT: 30 s (ref 24–36)

## 2024-11-11 LAB — COMPREHENSIVE METABOLIC PANEL WITH GFR
ALT: 10 U/L (ref 0–44)
AST: 15 U/L (ref 15–41)
Albumin: 3.4 g/dL — ABNORMAL LOW (ref 3.5–5.0)
Alkaline Phosphatase: 100 U/L (ref 38–126)
Anion gap: 10 (ref 5–15)
BUN: 8 mg/dL (ref 8–23)
CO2: 25 mmol/L (ref 22–32)
Calcium: 8.4 mg/dL — ABNORMAL LOW (ref 8.9–10.3)
Chloride: 107 mmol/L (ref 98–111)
Creatinine, Ser: 0.6 mg/dL — ABNORMAL LOW (ref 0.61–1.24)
GFR, Estimated: >60 mL/min
Glucose, Bld: 99 mg/dL (ref 70–99)
Potassium: 3.7 mmol/L (ref 3.5–5.1)
Sodium: 141 mmol/L (ref 135–145)
Total Bilirubin: 0.4 mg/dL (ref 0.0–1.2)
Total Protein: 6.7 g/dL (ref 6.5–8.1)

## 2024-11-11 LAB — PROTIME-INR
INR: 1.1 (ref 0.8–1.2)
Prothrombin Time: 15.1 s (ref 11.4–15.2)

## 2024-11-11 LAB — CBG MONITORING, ED: Glucose-Capillary: 95 mg/dL (ref 70–99)

## 2024-11-11 LAB — MAGNESIUM: Magnesium: 1.9 mg/dL (ref 1.7–2.4)

## 2024-11-11 MED ORDER — HYDROMORPHONE HCL 1 MG/ML IJ SOLN: 0.5000 mg | INTRAMUSCULAR | Status: DC | PRN

## 2024-11-11 MED ORDER — IOHEXOL 350 MG/ML SOLN
75.0000 mL | Freq: Once | INTRAVENOUS | Status: AC | PRN
  Administered 2024-11-11: 75 mL via INTRAVENOUS

## 2024-11-11 MED ORDER — ACETAMINOPHEN 650 MG RE SUPP: 650.0000 mg | Freq: Four times a day (QID) | RECTAL | Status: DC | PRN

## 2024-11-11 MED ORDER — ALBUTEROL SULFATE HFA 108 (90 BASE) MCG/ACT IN AERS: 2.0000 | INHALATION_SPRAY | Freq: Four times a day (QID) | RESPIRATORY_TRACT | Status: DC | PRN

## 2024-11-11 MED ORDER — GABAPENTIN 100 MG PO CAPS
200.0000 mg | ORAL_CAPSULE | Freq: Three times a day (TID) | ORAL | Status: DC
  Administered 2024-11-11 – 2024-11-13 (×4): 200 mg via ORAL
  Filled 2024-11-11 (×5): qty 2

## 2024-11-11 MED ORDER — DULOXETINE HCL 30 MG PO CPEP
30.0000 mg | ORAL_CAPSULE | Freq: Every day | ORAL | Status: DC
  Administered 2024-11-13: 30 mg via ORAL
  Filled 2024-11-11 (×2): qty 1

## 2024-11-11 MED ORDER — RIVAROXABAN 20 MG PO TABS
20.0000 mg | ORAL_TABLET | Freq: Every day | ORAL | Status: DC
  Administered 2024-11-12: 20 mg via ORAL
  Filled 2024-11-11: qty 1

## 2024-11-11 MED ORDER — SENNOSIDES-DOCUSATE SODIUM 8.6-50 MG PO TABS: 1.0000 | ORAL_TABLET | Freq: Every evening | ORAL | Status: DC | PRN

## 2024-11-11 MED ORDER — SODIUM CHLORIDE 0.9% FLUSH
3.0000 mL | Freq: Two times a day (BID) | INTRAVENOUS | Status: DC
  Administered 2024-11-11 – 2024-11-13 (×3): 3 mL via INTRAVENOUS

## 2024-11-11 MED ORDER — SALINE SPRAY 0.65 % NA SOLN
1.0000 | NASAL | Status: DC | PRN
  Filled 2024-11-11: qty 44

## 2024-11-11 MED ORDER — ACETAMINOPHEN 325 MG PO TABS: 650.0000 mg | ORAL_TABLET | Freq: Four times a day (QID) | ORAL | Status: DC | PRN

## 2024-11-11 MED ORDER — FUROSEMIDE 40 MG PO TABS
40.0000 mg | ORAL_TABLET | Freq: Every morning | ORAL | Status: DC
  Administered 2024-11-12 – 2024-11-13 (×2): 40 mg via ORAL
  Filled 2024-11-11 (×2): qty 1

## 2024-11-11 MED ORDER — ALBUTEROL SULFATE (2.5 MG/3ML) 0.083% IN NEBU: 2.5000 mg | INHALATION_SOLUTION | Freq: Four times a day (QID) | RESPIRATORY_TRACT | Status: DC | PRN

## 2024-11-11 MED ORDER — HYDROCODONE-ACETAMINOPHEN 5-325 MG PO TABS
1.0000 | ORAL_TABLET | ORAL | Status: DC | PRN
  Administered 2024-11-11: 1 via ORAL
  Filled 2024-11-11: qty 1

## 2024-11-11 MED ORDER — FLUTICASONE PROPIONATE 50 MCG/ACT NA SUSP
2.0000 | Freq: Every day | NASAL | Status: DC
  Administered 2024-11-11: 2 via NASAL
  Filled 2024-11-11 (×2): qty 16

## 2024-11-11 NOTE — H&P (Addendum)
 " History and Physical    [name] [name] FMW:[id number] DOB: [birth year] DOA: 11/11/2024  PCP: [name], FNP  Patient coming from: Home  I have personally briefly reviewed patient's old medical records in [facility]  Chief Complaint: Syncope with falls  HPI: [name] [name] is a 64 y.o. 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  Patient presents to the ED for evaluation of frequent falls in setting of syncopal episodes.  Patient correlates that these events have been ongoing for the last few weeks ever since he has been unable to use his CPAP.  Patient states that he has not been able to use his nasal pillows due to stuffed up nose.  Patient states that he will suddenly lose consciousness when he is walking.  He will wake up on the floor.  He denies any prodrome or warning symptoms.  He has not had any recent lightheadedness, dizziness, chest pain, palpitations.  He reports chronic baseline dyspnea related to COPD.  He has chronic swelling to lower extremities due to lymphedema which is not significantly changed.   He has chronic pain and a history of opioid use disorder which has been managed by the methadone  clinic.  He says he has been on methadone  for about 1 year.  There was concern that the methadone  was contributing to his syncopal events and he has recently started on a slow taper from 140 mg daily and currently taking 130 mg daily.  He has been having left shoulder pain after one of the recent falls.  ED Course  Labs/Imaging on admission: I have personally reviewed following labs and imaging studies.  Initial vitals showed BP 191/96, pulse 84, RR 20,  temp 98.3 F, SpO2 94% on room air.  Labs showed WBC 9.4, hemoglobin 11.0, platelets 241, sodium 141, potassium 3.7, bicarb 25, BUN 8, creatinine 0.60, serum glucose 99, LFTs within normal limits, magnesium  1.9.  CT head without contrast negative for acute intracranial abnormality.  Remote left posterior MCA territory infarct noted.  CTA head showed mild intracranial atherosclerosis without a large vessel occlusion or significant proximal stenosis.  Left shoulder x-ray showed an acute fracture of the distal left clavicle.  EDP discussed with cardiology who recommended medical admission and they will see in consultation in the morning.  The hospitalist service was consulted for admission.  Review of Systems: All systems reviewed and are negative except as documented in history of present illness above.   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  He says he has cut down from 3 PPD to 1 PPD.  Allergies[1]  Family History   Problem Relation Age of Onset   Heart failure Mother    Hypertension Mother    Cancer Mother    Hypertension Father    Depression Sister      Prior to Admission medications  Medication Sig Start Date End Date Taking? Authorizing Provider  albuterol  (VENTOLIN  HFA) 108 (90 Base) MCG/ACT inhaler Inhale 2 puffs into the lungs every 6 (six) hours as needed for wheezing or shortness of breath. 09/20/24   [name], FNP  DULoxetine  (CYMBALTA ) 30 MG capsule Take 1 capsule (30 mg total) by mouth daily. 09/20/24 10/20/24  [name], FNP  furosemide  (LASIX ) 40 MG tablet Take 1 tablet (40 mg total) by mouth in the morning. 09/20/24 10/20/24  [name], FNP  gabapentin  (NEURONTIN ) 100 MG capsule Take 2 capsules (200 mg total) by mouth 3 (three) times daily. 09/20/24 10/20/24  Duanne Butler DASEN, MD  hydrALAZINE  (APRESOLINE ) 50 MG tablet Take 1 tablet (50 mg total) by mouth 2 (two) times daily. 10/18/24   Vannie Reche RAMAN, NP  loratadine  (CLARITIN ) 10 MG tablet Take 1 tablet (10 mg total) by mouth daily. 09/20/24   [name], FNP  methadone  (DOLOPHINE ) 10 MG/ML solution Take 135 mg by mouth daily.    [provider]  Multiple Vitamins-Minerals (ONE-A-[place] PROACTIVE 65+) TABS Take 2 tablets by mouth daily.    [provider]  rivaroxaban  (XARELTO ) 20 MG TABS tablet Take 1 tablet (20 mg total) by mouth daily with supper. 09/20/24   [name], FNP  valsartan  (DIOVAN ) 160 MG tablet Take 1 tablet (160 mg total) by mouth daily. 10/11/24   Vannie Reche RAMAN, NP    Physical Exam: Vitals:   11/11/24 1430 11/11/24 1838 11/11/24 2107 11/11/24 2234  BP: (!) 148/78 (!) 179/112  (!) 155/87  Pulse: 74 76  68  Resp: 16 16  20   Temp: 98.3 F (36.8 C) 98.3 F (36.8 C) 98.2 F (36.8 C) 99.1 F (37.3 C)  TempSrc: Oral Oral Oral Oral  SpO2: 93% 94%  93%  Weight:      Height:       Constitutional: Sitting up on the side of the bed.  NAD, calm, comfortable Eyes:  EOMI, lids and conjunctivae normal ENMT: Mucous membranes are moist. Posterior pharynx clear of any exudate or lesions.Normal dentition.  Neck: normal, supple, no masses. Respiratory: clear to auscultation bilaterally, no wheezing, no crackles. Normal respiratory effort. No accessory muscle use.  Cardiovascular: Regular rate and rhythm, no murmurs / rubs / gallops.  Lymphedema bilateral lower extremities. 2+ pedal pulses. Abdomen: no tenderness, no masses palpated. Musculoskeletal: Tender to palpation over left AC joint, ROM diminished left shoulder otherwise intact Skin: Lymphedema bilateral lower extremities with chronic stasis changes.  Wound dressings in place. Neurologic: Sensation intact. Strength slightly diminished LUE due to distal clavicular fracture otherwise 5/5. Psychiatric: Normal judgment and insight. Alert and oriented x 3. Normal mood.  EKG: Personally reviewed. Sinus rhythm, rate 84, no acute ischemic changes, QTc 509. QTc is more prolonged when compared to previous.  Assessment/Plan Principal Problem:   Syncope and collapse Active Problems:   Essential hypertension   COPD (chronic obstructive pulmonary disease) (HCC)   OSA on CPAP   (HFpEF) heart failure with preserved ejection fraction (HCC)   Chronic pain syndrome   Tobacco use   Prolonged Q-T interval on ECG   History of CVA (cerebrovascular accident)   Traumatic closed fracture of distal clavicle with minimal displacement, left, initial encounter   Jakoby Melendrez Wach is a 64 y.o. 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  Assessment and Plan: Recurrent syncope with collapse Prolonged  QTc: Patient reports frequent syncopal episodes without prodrome.  Has fallen multiple times due to these events.  EKG shows prolonged QTc 509 ms. - Keep on telemetry - Obtain orthostatic vitals - Obtain echocardiogram - Hold methadone  given prolonged QTc - PT/OT eval - Cardiology to consult in a.m.  Chronic HFpEF: Does not appear acutely decompensated.  Resume oral Lasix  40 mg daily in the morning.  Rest of cardiac meds will be on hold pending med rec and orthostatic vitals.  COPD: Chronic and stable.  Continue albuterol  as needed.  History of PE/DVT: Continue Xarelto .  History of CVA: Continue Xarelto .  Does not appear to be on statin.  Acute fracture of distal left clavicle: Occurring after fall.  X-ray shows mild displacement, no dislocation.  Applied shoulder sling for comfort.  PT/OT eval.  Home methadone  on hold as above.  Have ordered Norco as needed for pain management.  Substance use disorder/chronic pain syndrome: Follows with the methadone  clinic and has recently started on taper of methadone  from 140 mg daily, now takes 130 mg daily.  Methadone  is on hold given prolonged QTc.  Monitor for opioid withdrawal.  Norco ordered as needed for acute pain control as above with IV Dilaudid  only as needed for severe uncontrolled pain.  Hypertension: Home meds on hold pending orthostatic vitals and medication reconciliation.  Chronic lymphedema and nonhealing wounds of both lower extremities: Chronic and appears stable.  Follows with the wound care center.  OSA: Continue CPAP nightly.  Tobacco use: Patient reports he has cut down from 3 PPD to 1 PPD.  He declines nicotine  patch.   DVT prophylaxis: rivaroxaban  (XARELTO ) tablet 20 mg Start: 11/12/24 1700 rivaroxaban  (XARELTO ) tablet 20 mg   Code Status: Full code, confirmed with patient on admission Family Communication: Discussed with patient, he has discussed with family Disposition Plan: From home and likely discharge to  home pending clinical progress Consults called: Cardiology Severity of Illness: The appropriate patient status for this patient is OBSERVATION. Observation status is judged to be reasonable and necessary in order to provide the required intensity of service to ensure the patient's safety. The patient's presenting symptoms, physical exam findings, and initial radiographic and laboratory data in the context of their medical condition is felt to place them at decreased risk for further clinical deterioration. Furthermore, it is anticipated that the patient will be medically stable for discharge from the hospital within 2 midnights of admission.   Jorie Blanch MD [facility]  If 7PM-7AM, please contact night-coverage [email or web address]  11/11/2024,  11:14 PM      [1]  Allergies Allergen Reactions   Amlodipine  Swelling    BLE peripheral edema    "

## 2024-11-11 NOTE — ED Provider Notes (Signed)
 Patient received from previous provider please refer to previous note for full HPI.  In short patient has had episodes where he will fall to the ground.  These episodes have increased in frequency over the last 3 days. Pending CT head for further evaluation.   Physical Exam  BP (!) 148/78 (BP Location: Left Arm)   Pulse 74   Temp 98.3 F (36.8 C) (Oral)   Resp 16   Ht 5' 11 (1.803 m)   Wt 113.4 kg   SpO2 93%   BMI 34.87 kg/m   Physical Exam Vitals and nursing note reviewed.  Constitutional:      Appearance: Normal appearance.  HENT:     Head: Normocephalic and atraumatic.     Nose: Nose normal.  Eyes:     Extraocular Movements: Extraocular movements intact.     Conjunctiva/sclera: Conjunctivae normal.     Pupils: Pupils are equal, round, and reactive to light.  Cardiovascular:     Rate and Rhythm: Normal rate.  Pulmonary:     Effort: Pulmonary effort is normal. No respiratory distress.     Breath sounds: Normal breath sounds.  Musculoskeletal:        General: Normal range of motion.     Cervical back: Normal range of motion.  Skin:    Capillary Refill: Capillary refill takes less than 2 seconds.  Neurological:     Mental Status: He is alert.     Comments: Patient does have some peripheral deficits but reports this is normal for him.  Psychiatric:        Mood and Affect: Mood normal.        Behavior: Behavior normal.     Procedures  Procedures  ED Course / MDM     Patient reports these episodes are intermittent in nature and he reports he will briefly lose consciousness and fall to the ground.  As soon as he hits the ground he reports regaining consciousness.  He has significant history of snorting fentanyl  and is on methadone  currently and has been seen by his methadone  clinic physician.  They were concerned he is losing consciousness due to the methadone .  He advises he is slowly weaning off the methadone  currently but is still having these episodes.  He has had  several strokes in the past with only known deficit is his speech.  He takes his Xarelto  as prescribed and has not had any head trauma with these recent falls.  He also deals with chronic lymphedema in his lower extremities which is managed with compression stockings and wraps.  Patient is ambulatory with a cane.  CT head was remarkable for no acute intracranial abnormality, remote left posterior MCA territory infarct.  Pending CTA head.  On evaluation patient had difficulty moving his left shoulder due to recent fall.  Will get complete shoulder x-ray.  No obvious neurological deficit noted on exam.  Decreased visual peripheral fields but patient reports this is normal.  CTA overall unremarkable.  Patient has been seen by cardiology and methadone  clinic for concerns of syncopal episodes.  Patient is currently being weaned off of methadone  and cardiology has him set up for sleep study for potential sleep apnea.  Cardiology advised patient to return to ED if patient continued having syncopal episodes.  Cardiology was consulted due to patient having long QT on EKG and syncopal episodes.  Leontine Salen PA-C was consulted with cardiology and they advised to admit for observation overnight and they would formally consult patient in the  morning.  Dr. Tobie with hospitalist service was consulted for admission and agreed to admit patient.       Myriam Fonda GORMAN DEVONNA 11/11/24 2214    Ula Prentice SAUNDERS, MD 11/11/24 [phone]

## 2024-11-11 NOTE — Hospital Course (Signed)
 [name] [name] is a 64 y.o. 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

## 2024-11-11 NOTE — Progress Notes (Signed)
"  ° °  Cardiology recommends overnight evaluation with formal cardiology consult tomorrow morning.   Leontine LOISE Salen, PA-C 11/11/2024, 6:59 PM    "

## 2024-11-11 NOTE — ED Notes (Signed)
 Pt requesting water. Writer checked with Dr Ula who stated it was okay. Water provided to pt.

## 2024-11-11 NOTE — ED Triage Notes (Addendum)
 Patient reports frequent falls the last 3 days. Seen here recently for same problem. Last fall was last night, denies LOC but reports did hit his head. Denies midline cervical pain. Patient is on blood thinners. Patient is alert and oriented x 4. Airway patent, respirations even and unlabored. Skin normal, warm and dry. Patient walks with a walker at home, ambulatory with cane in triage. Patient did not take any medications today.

## 2024-11-11 NOTE — ED Provider Notes (Signed)
 " [facility] Provider Note   CSN: [id number] Arrival date & time: 11/11/24  1026     Patient presents with: [name] [name] is a 64 y.o. male who comes to the ED with a primary concern of increasing falls over the last 3 days.  Says he feels like he has increased drooling from the right side of the face, otherwise does not have any difficulty swallowing, and while walking has not had any instability beyond the fact that he has to use a walker at baseline.  Denies have any pain did not, denies any nausea or vomiting.  Does report taking blood thinners, has noted to be on Xarelto  for previous pulmonary embolus.  Further review of previous medical history shows diagnosis of COPD, hypertension, hyperlipidemia, HFpEF, GAD.  Also noted previous DVT in the left peroneal vein likely precipitating previous PE.  Denies any dyspnea currently.    Fall       Prior to Admission medications  Medication Sig Start Date End Date Taking? Authorizing Provider  albuterol  (VENTOLIN  HFA) 108 (90 Base) MCG/ACT inhaler Inhale 2 puffs into the lungs every 6 (six) hours as needed for wheezing or shortness of breath. 09/20/24   [name], FNP  DULoxetine  (CYMBALTA ) 30 MG capsule Take 1 capsule (30 mg total) by mouth daily. 09/20/24 10/20/24  [name], FNP  furosemide  (LASIX ) 40 MG tablet Take 1 tablet (40 mg total) by mouth in the morning. 09/20/24 10/20/24  [name], FNP  gabapentin  (NEURONTIN ) 100 MG capsule Take 2 capsules (200 mg total) by mouth 3 (three) times daily. 09/20/24 10/20/24  Duanne Butler DASEN, MD  hydrALAZINE  (APRESOLINE ) 50 MG tablet Take 1 tablet (50 mg total) by mouth 2 (two) times daily. 10/18/24   Vannie Reche RAMAN, NP  loratadine  (CLARITIN ) 10 MG tablet Take 1 tablet (10 mg total) by mouth daily. 09/20/24   [name], FNP  methadone  (DOLOPHINE ) 10 MG/ML solution Take 135 mg by mouth daily.    [provider]  Multiple Vitamins-Minerals (ONE-A-[place] PROACTIVE 65+) TABS Take 2 tablets by mouth daily.    [provider]  rivaroxaban  (XARELTO ) 20 MG TABS tablet Take 1 tablet (20 mg total) by mouth daily with supper. 09/20/24   [name], FNP  valsartan  (DIOVAN ) 160 MG tablet Take 1 tablet (160 mg total) by mouth daily. 10/11/24   Vannie Reche RAMAN, NP    Allergies: Amlodipine     Review of Systems  Neurological:  Positive for weakness.  All other systems reviewed and are negative.   Updated Vital Signs BP (!) 181/88 (BP Location: Right Arm)   Pulse 72   Temp 98.9 F (37.2 C) (Oral)   Resp 18   Ht 5' 11 (1.803 m)   Wt 113.4 kg   SpO2 94%   BMI 34.87 kg/m   Physical Exam Vitals and nursing note reviewed.  Constitutional:      General: He is not in acute distress.    Appearance: Normal appearance.  HENT:     Head: Normocephalic and atraumatic.     Mouth/Throat:     Mouth: Mucous membranes are moist.     Pharynx: Oropharynx is clear.  Eyes:     General: Lids are normal. Vision grossly intact. Gaze aligned appropriately.     Extraocular Movements: Extraocular movements intact.     Conjunctiva/sclera: Conjunctivae normal.     Pupils: Pupils are equal, round, and  reactive to light.  Cardiovascular:     Rate and Rhythm: Normal rate and regular rhythm.     Pulses: Normal pulses.     Heart sounds: Normal heart sounds. No murmur heard.    No friction rub. No gallop.  Pulmonary:     Effort: Pulmonary effort is normal.     Breath sounds: Normal breath sounds.  Abdominal:     General: Abdomen is flat. Bowel sounds are normal.     Palpations: Abdomen is soft.  Musculoskeletal:        General: Normal range of motion.     Cervical back: Normal range of motion and neck supple.     Right lower leg: No edema.     Left lower leg: No edema.  Skin:    General: Skin is warm and dry.     Capillary Refill: Capillary refill takes less than 2 seconds.     Findings: Bruising  present.     Comments: Multiple bruises appreciated to the anterior thorax as well as to the bilateral upper extremities.  Various stages of healing noted.  Neurological:     General: No focal deficit present.     Mental Status: He is alert and oriented to person, place, and time. Mental status is at baseline.     GCS: GCS eye subscore is 4. GCS verbal subscore is 5. GCS motor subscore is 6.     Cranial Nerves: Cranial nerves 2-12 are intact.     Sensory: Sensation is intact.     Motor: Motor function is intact.     Comments: On for 5 strength in all 4 extremities, ambulates using a shuffle gait and has to use a cane to ambulate at baseline.  Psychiatric:        Mood and Affect: Mood normal.     (all labs ordered are listed, but only abnormal results are displayed) Labs Reviewed  CBC WITH DIFFERENTIAL/PLATELET - Abnormal; Notable for the following components:      Result Value   RBC 3.67 (*)    Hemoglobin 11.0 (*)    HCT 33.8 (*)    All other components within normal limits  COMPREHENSIVE METABOLIC PANEL WITH GFR - Abnormal; Notable for the following components:   Creatinine, Ser 0.60 (*)    Calcium  8.4 (*)    Albumin 3.4 (*)    All other components within normal limits  PROTIME-INR  APTT  CBG MONITORING, ED    EKG: None  Radiology: No results found.   Procedures   Medications Ordered in the ED - No data to display                                  Medical Decision Making Amount and/or Complexity of Data Reviewed Labs: ordered. Radiology: ordered.  Risk Prescription drug management. Decision regarding hospitalization.   Medical Decision Making:   [name] [name] is a 64 y.o. male who presented to the ED today with intermittent LOC and falls detailed above.    External chart has been reviewed including previous labs, admissions, and OP records. Patient's presentation is complicated by their history of HTN, HLD, GAD, chronic pain syndrome.  Patient placed  on continuous vitals and telemetry monitoring while in ED which was reviewed periodically.  Complete initial physical exam performed, notably the patient  was alert and orients, and in NAD.  Multiple bruises noted on thorax in multiple stages of  healing, Shuffled gait w/ assistance of a cane which is b/l for this pt.    Reviewed and confirmed nursing documentation for past medical history, family history, social history.    Initial Assessment:   With the patient's presentation of repeated falls, consider possible arrhythmia, aortic stenosis, acute HF, hypoglycemia, acute neurovascular insult.  Given falls w/ hx of DAC use, consider acute intracranial bleed.    Initial Plan:  CT head w/o contrast to evaluate for acute bleed.  Screening labs including CBC and Metabolic panel to evaluate for infectious or metabolic etiology of disease.  Coag studies to evaluate risk for intracranial bleed. EKG to evaluate for cardiac pathology Objective evaluation as below reviewed   Initial Study Results:   Laboratory  All laboratory results reviewed without evidence of clinically relevant pathology.   Exceptions include: Anemia of 11 w/ normal MCV/MCH.     EKG EKG was reviewed independently. Rate, rhythm, axis, intervals all examined and without medically relevant abnormality. ST segments without concerns for elevations.  There are noted prolonged QT segments, new finding for this patient.    Radiology:  All images reviewed independently. Agree with radiology report at this time.   CT HEAD WO CONTRAST Result Date: 11/11/2024 EXAM: CT HEAD WITHOUT 11/11/2024 03:07:54 PM TECHNIQUE: CT of the head was performed without the administration of intravenous contrast. Automated exposure control, iterative reconstruction, and/or weight based adjustment of the mA/kV was utilized to reduce the radiation dose to as low as reasonably achievable. COMPARISON: CT head 07/11/2023. CLINICAL HISTORY: Neuro deficit, acute, stroke  suspected FINDINGS: BRAIN AND VENTRICLES: No acute intracranial hemorrhage. No mass effect or midline shift. No extra-axial fluid collection. No evidence of acute infarct. Remote left posterior MCA territory infarct. No hydrocephalus. ORBITS: No acute abnormality. SINUSES AND MASTOIDS: No acute abnormality. SOFT TISSUES AND SKULL: No acute skull fracture. No acute soft tissue abnormality. IMPRESSION: 1. No acute intracranial abnormality. 2. Remote left posterior MCA territory infarct. Electronically signed by: Gilmore Molt 11/11/2024 03:40 PM EST RP Workstation: [id number]   Reassessment and Plan:   Based on reported repeat falls and findings of remote left posterior MCA infarct, ordered CTA of the head to evaluate for cerebrovascular etiology of falls, intermittent LOC.  Labs obtained thus far only show a mild normocytic anemia, otherwise no concerning findings.  Anticipate admission for further neurologic as well as cardiovascular w/u given his complicated hx and recent hx of falls over the past month.  Results of CTA pending, plan at present is to evaluate results and consult for admission/specialty consultation.  Care signed off to DOROTHA Siva, PA-C at end of shift.       Final diagnoses:  None    ED Discharge Orders     None          Myriam Dorn BROCKS, [place] 11/13/24 2110    Tegeler, Lonni PARAS, MD 11/14/24 1601  "

## 2024-11-12 ENCOUNTER — Observation Stay (HOSPITAL_COMMUNITY)

## 2024-11-12 ENCOUNTER — Other Ambulatory Visit: Payer: Self-pay | Admitting: Cardiology

## 2024-11-12 DIAGNOSIS — E66811 Obesity, class 1: Secondary | ICD-10-CM | POA: Diagnosis present

## 2024-11-12 DIAGNOSIS — I5032 Chronic diastolic (congestive) heart failure: Secondary | ICD-10-CM | POA: Diagnosis present

## 2024-11-12 DIAGNOSIS — Z8673 Personal history of transient ischemic attack (TIA), and cerebral infarction without residual deficits: Secondary | ICD-10-CM | POA: Diagnosis not present

## 2024-11-12 DIAGNOSIS — S42032A Displaced fracture of lateral end of left clavicle, initial encounter for closed fracture: Secondary | ICD-10-CM | POA: Diagnosis present

## 2024-11-12 DIAGNOSIS — Z79899 Other long term (current) drug therapy: Secondary | ICD-10-CM | POA: Diagnosis not present

## 2024-11-12 DIAGNOSIS — Z86718 Personal history of other venous thrombosis and embolism: Secondary | ICD-10-CM | POA: Diagnosis not present

## 2024-11-12 DIAGNOSIS — I10 Essential (primary) hypertension: Secondary | ICD-10-CM | POA: Diagnosis not present

## 2024-11-12 DIAGNOSIS — I890 Lymphedema, not elsewhere classified: Secondary | ICD-10-CM | POA: Diagnosis present

## 2024-11-12 DIAGNOSIS — F17210 Nicotine dependence, cigarettes, uncomplicated: Secondary | ICD-10-CM | POA: Diagnosis present

## 2024-11-12 DIAGNOSIS — Z7901 Long term (current) use of anticoagulants: Secondary | ICD-10-CM | POA: Diagnosis not present

## 2024-11-12 DIAGNOSIS — J4489 Other specified chronic obstructive pulmonary disease: Secondary | ICD-10-CM | POA: Diagnosis present

## 2024-11-12 DIAGNOSIS — Z6834 Body mass index (BMI) 34.0-34.9, adult: Secondary | ICD-10-CM | POA: Diagnosis not present

## 2024-11-12 DIAGNOSIS — R55 Syncope and collapse: Secondary | ICD-10-CM

## 2024-11-12 DIAGNOSIS — I160 Hypertensive urgency: Secondary | ICD-10-CM | POA: Diagnosis present

## 2024-11-12 DIAGNOSIS — F32A Depression, unspecified: Secondary | ICD-10-CM | POA: Diagnosis present

## 2024-11-12 DIAGNOSIS — F1110 Opioid abuse, uncomplicated: Secondary | ICD-10-CM | POA: Diagnosis present

## 2024-11-12 DIAGNOSIS — W19XXXA Unspecified fall, initial encounter: Secondary | ICD-10-CM | POA: Diagnosis present

## 2024-11-12 DIAGNOSIS — E785 Hyperlipidemia, unspecified: Secondary | ICD-10-CM | POA: Diagnosis present

## 2024-11-12 DIAGNOSIS — S2232XA Fracture of one rib, left side, initial encounter for closed fracture: Secondary | ICD-10-CM | POA: Diagnosis present

## 2024-11-12 DIAGNOSIS — Z888 Allergy status to other drugs, medicaments and biological substances status: Secondary | ICD-10-CM | POA: Diagnosis not present

## 2024-11-12 DIAGNOSIS — G4733 Obstructive sleep apnea (adult) (pediatric): Secondary | ICD-10-CM | POA: Diagnosis present

## 2024-11-12 DIAGNOSIS — G894 Chronic pain syndrome: Secondary | ICD-10-CM | POA: Diagnosis present

## 2024-11-12 DIAGNOSIS — J439 Emphysema, unspecified: Secondary | ICD-10-CM | POA: Diagnosis present

## 2024-11-12 DIAGNOSIS — I110 Hypertensive heart disease with heart failure: Secondary | ICD-10-CM | POA: Diagnosis present

## 2024-11-12 DIAGNOSIS — Z8249 Family history of ischemic heart disease and other diseases of the circulatory system: Secondary | ICD-10-CM | POA: Diagnosis not present

## 2024-11-12 DIAGNOSIS — R296 Repeated falls: Secondary | ICD-10-CM | POA: Diagnosis present

## 2024-11-12 LAB — ECHOCARDIOGRAM COMPLETE
AR max vel: 2.35 cm2
AV Area VTI: 2.59 cm2
AV Area mean vel: 2.24 cm2
AV Mean grad: 6 mmHg
AV Peak grad: 11.3 mmHg
Ao pk vel: 1.68 m/s
Area-P 1/2: 3.42 cm2
Height: 71 in
S' Lateral: 3.9 cm
Weight: 4000 [oz_av]

## 2024-11-12 LAB — D-DIMER, QUANTITATIVE: D-Dimer, Quant: 1.69 ug{FEU}/mL — ABNORMAL HIGH (ref 0.00–0.50)

## 2024-11-12 LAB — BASIC METABOLIC PANEL WITH GFR
Anion gap: 9 (ref 5–15)
BUN: 7 mg/dL — ABNORMAL LOW (ref 8–23)
CO2: 25 mmol/L (ref 22–32)
Calcium: 8.5 mg/dL — ABNORMAL LOW (ref 8.9–10.3)
Chloride: 107 mmol/L (ref 98–111)
Creatinine, Ser: 0.63 mg/dL (ref 0.61–1.24)
GFR, Estimated: >60 mL/min
Glucose, Bld: 80 mg/dL (ref 70–99)
Potassium: 3.9 mmol/L (ref 3.5–5.1)
Sodium: 141 mmol/L (ref 135–145)

## 2024-11-12 LAB — CBC
HCT: 33.8 % — ABNORMAL LOW (ref 39.0–52.0)
Hemoglobin: 10.7 g/dL — ABNORMAL LOW (ref 13.0–17.0)
MCH: 29.5 pg (ref 26.0–34.0)
MCHC: 31.7 g/dL (ref 30.0–36.0)
MCV: 93.1 fL (ref 80.0–100.0)
Platelets: 233 K/uL (ref 150–400)
RBC: 3.63 MIL/uL — ABNORMAL LOW (ref 4.22–5.81)
RDW: 15.6 % — ABNORMAL HIGH (ref 11.5–15.5)
WBC: 8.3 K/uL (ref 4.0–10.5)
nRBC: 0 % (ref 0.0–0.2)

## 2024-11-12 LAB — TROPONIN T, HIGH SENSITIVITY
Troponin T High Sensitivity: 41 ng/L — ABNORMAL HIGH (ref 0–19)
Troponin T High Sensitivity: 62 ng/L — ABNORMAL HIGH (ref 0–19)

## 2024-11-12 LAB — GLUCOSE, CAPILLARY: Glucose-Capillary: 75 mg/dL (ref 70–99)

## 2024-11-12 LAB — MRSA NEXT GEN BY PCR, NASAL: MRSA by PCR Next Gen: NOT DETECTED

## 2024-11-12 MED ORDER — HYDRALAZINE HCL 20 MG/ML IJ SOLN
10.0000 mg | Freq: Four times a day (QID) | INTRAMUSCULAR | Status: DC | PRN
  Administered 2024-11-12 – 2024-11-13 (×2): 10 mg via INTRAVENOUS
  Filled 2024-11-12 (×2): qty 1

## 2024-11-12 MED ORDER — LABETALOL HCL 5 MG/ML IV SOLN
20.0000 mg | Freq: Four times a day (QID) | INTRAVENOUS | Status: DC | PRN
  Administered 2024-11-12: 20 mg via INTRAVENOUS
  Filled 2024-11-12: qty 4

## 2024-11-12 MED ORDER — HYDRALAZINE HCL 50 MG PO TABS
50.0000 mg | ORAL_TABLET | Freq: Two times a day (BID) | ORAL | Status: DC
  Administered 2024-11-12: 50 mg via ORAL
  Filled 2024-11-12: qty 1

## 2024-11-12 MED ORDER — PERFLUTREN LIPID MICROSPHERE
1.0000 mL | INTRAVENOUS | Status: AC | PRN
  Administered 2024-11-12: 3 mL via INTRAVENOUS

## 2024-11-12 MED ORDER — NITROGLYCERIN 0.4 MG SL SUBL
0.4000 mg | SUBLINGUAL_TABLET | SUBLINGUAL | Status: DC | PRN
  Administered 2024-11-12 (×3): 0.4 mg via SUBLINGUAL
  Filled 2024-11-12: qty 1

## 2024-11-12 MED ORDER — IRBESARTAN 150 MG PO TABS
150.0000 mg | ORAL_TABLET | Freq: Every day | ORAL | Status: DC
  Administered 2024-11-12: 150 mg via ORAL
  Filled 2024-11-12 (×2): qty 1

## 2024-11-12 MED ORDER — LORAZEPAM 2 MG/ML IJ SOLN: 1.0000 mg | Freq: Four times a day (QID) | INTRAMUSCULAR | Status: DC | PRN

## 2024-11-12 MED ORDER — HYDRALAZINE HCL 50 MG PO TABS
75.0000 mg | ORAL_TABLET | Freq: Three times a day (TID) | ORAL | Status: DC
  Administered 2024-11-12 – 2024-11-13 (×3): 75 mg via ORAL
  Filled 2024-11-12 (×3): qty 1

## 2024-11-12 MED ORDER — METHADONE HCL 10 MG/ML PO CONC
130.0000 mg | Freq: Every day | ORAL | Status: DC
  Administered 2024-11-12 – 2024-11-13 (×2): 130 mg via ORAL
  Filled 2024-11-12 (×2): qty 15

## 2024-11-12 MED ORDER — IOHEXOL 350 MG/ML SOLN
75.0000 mL | Freq: Once | INTRAVENOUS | Status: AC | PRN
  Administered 2024-11-12: 75 mL via INTRAVENOUS

## 2024-11-12 MED ORDER — LORATADINE 10 MG PO TABS
10.0000 mg | ORAL_TABLET | Freq: Every day | ORAL | Status: DC
  Administered 2024-11-12 – 2024-11-13 (×2): 10 mg via ORAL
  Filled 2024-11-12 (×2): qty 1

## 2024-11-12 MED ORDER — HYDROXYZINE HCL 25 MG PO TABS: 25.0000 mg | ORAL_TABLET | Freq: Four times a day (QID) | ORAL | Status: DC | PRN

## 2024-11-12 MED ORDER — NICOTINE 21 MG/24HR TD PT24
21.0000 mg | MEDICATED_PATCH | TRANSDERMAL | Status: DC
  Administered 2024-11-12: 21 mg via TRANSDERMAL
  Filled 2024-11-12: qty 1

## 2024-11-12 MED ORDER — CHLORHEXIDINE GLUCONATE CLOTH 2 % EX PADS
6.0000 | MEDICATED_PAD | Freq: Every day | CUTANEOUS | Status: DC
  Administered 2024-11-12 – 2024-11-13 (×2): 6 via TOPICAL

## 2024-11-12 MED ORDER — ORAL CARE MOUTH RINSE: 15.0000 mL | OROMUCOSAL | Status: DC | PRN

## 2024-11-12 NOTE — Consult Note (Addendum)
 Please see previous WOC consult note by M. Massie, [facility].  Writer did review wound care center notes and K2 Urgo  (a 2 layer compression wrap) is utilized at wound care center.  [facility] does not carry this on formulary.  Discussed with S. Anderson, RN if patient does not desire unna boots can use Ace bandage or Coban for light compression over ordered wound care and Kerlix.    WOC team will not follow Reconsult if further needs arise.   Thank you,    Powell Bar MSN, RN-BC, [employer]

## 2024-11-12 NOTE — Progress Notes (Signed)
" °   11/12/24 2315  BiPAP/CPAP/SIPAP  $ Non-Invasive Home Ventilator  Initial  $ Face Mask Large  Yes  BiPAP/CPAP/SIPAP Pt Type Adult  BiPAP/CPAP/SIPAP Resmed  Mask Type Full face mask  Dentures removed? Not applicable  Mask Size Large  Respiratory Rate 17 breaths/min  FiO2 (%) 21 %  Patient Home Machine No  Patient Home Mask No  Patient Home Tubing No  Auto Titrate Yes  Minimum cmH2O 5 cmH2O  Maximum cmH2O 20 cmH2O  Device Plugged into RED Power Outlet Yes  BiPAP/CPAP /SiPAP Vitals  Pulse Rate 79  Resp 17  SpO2 97 %  MEWS Score/Color  MEWS Score 0  MEWS Score Color Green    "

## 2024-11-12 NOTE — Progress Notes (Signed)
 PT Cancellation Note  Patient Details Name: Ramin Zoll Mcparland MRN: [id number] DOB: July 08, 1961   Cancelled Treatment:    Reason Eval/Treat Not Completed: Medical issues which prohibited therapy;Patient not medically ready. Rapid response called at 08:32 with 10/10 chest pain. Pt being transferred to ICU for additional medical management. Will hold PT eval pending additional medical work up.  Isaiah DEL. Beecher Furio, PT, DPT   [employer] 11/12/2024, 10:19 AM

## 2024-11-12 NOTE — Evaluation (Signed)
 Occupational Therapy Evaluation Patient Details Name: [name] [name] MRN: [id number] DOB: [birth year] Today's Date: 11/12/2024   History of Present Illness   64 y.o. 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     Clinical Impressions Pt admitted with the above concerns. Pt currently with functional limitations due to the deficits listed below (see OT Problem List). Prior to admit, pt reports that he completed BADL tasks independent-mod I level. Lives with an occasional roommate who provides transportation and assist with grocery shopping. No sling in room to provide education on use and management. Ortho tech called after session to deliver. Pt will benefit from acute skilled OT to increase their safety and independence with ADL and functional mobility for ADL to facilitate discharge. At time of evaluation, I don't foresee any follow up OT needs at this time. After this evaluation, pt was transferred to ICU and will be re-evaulated by OT at next session to determine if discharge recommendation needs to be updated.       If plan is discharge home, recommend the following:   A little help with bathing/dressing/bathroom;Assist for transportation;Assistance with cooking/housework;Help with stairs or ramp for entrance     Functional Status Assessment   Patient has had a recent decline in their functional status and demonstrates the ability to make significant improvements in function in a reasonable and predictable amount of time.     Equipment Recommendations   None recommended by OT      Precautions/Restrictions   Precautions Precautions: Fall Recall of  Precautions/Restrictions: Impaired Restrictions Weight Bearing Restrictions Per Provider Order: No     Mobility Bed Mobility Overal bed mobility: Independent     Transfers Overall transfer level: Independent Equipment used: None         Balance Overall balance assessment: History of Falls         ADL either performed or assessed with clinical judgement   ADL        General ADL Comments: Unable to assess during evaluation. Pt was able to ambulate to/from the bathroom to complete toileting standing up independently.     Vision Baseline Vision/History: 1 Wears glasses Ability to See in Adequate Light: 0 Adequate Patient Visual Report: No change from baseline Vision Assessment?: No apparent visual deficits     Perception Perception: Not tested       Praxis Praxis: Not tested       Pertinent Vitals/Pain Pain Assessment Pain Assessment: Faces Faces Pain Scale: Hurts even more Pain Location: chest, left clavicle Pain Descriptors / Indicators: Sharp, Grimacing, Discomfort Pain Intervention(s): Monitored during session, Other (comment) (Nurse is aware)     Extremity/Trunk Assessment Upper Extremity Assessment Upper Extremity Assessment: Right hand dominant;LUE deficits/detail LUE Deficits / Details: recent clavicle fracture. Sling ordered for comfort although no sling available. Called ortho tech at end of session.   Lower Extremity Assessment Lower Extremity Assessment: Defer to PT evaluation   Cervical / Trunk Assessment Cervical / Trunk Assessment: Kyphotic   Communication Communication Communication: No apparent difficulties   Cognition Arousal: Alert Behavior During Therapy: Flat affect (tearful at end of session) Cognition: No apparent impairments    Following commands: Intact       Cueing  General Comments  Cueing Techniques: Verbal cues              Home Living Family/patient expects to be discharged to:: Private residence Living  Arrangements: Non-relatives/Friends (Pt reports that he has an occasional roommate) Available Help at Discharge: Friend(s);Available PRN/intermittently (Roommate provides transportation and grocery shopping assistance)        Home Equipment: Agricultural Consultant (2 wheels);Cane - single point   Additional Comments: Unable to ask about DME during eval. Per chart review, pt has a RW and SPC      Prior Functioning/Environment Prior Level of Function : Independent/Modified Independent;History of Falls (last six months)       OT Problem List: Decreased strength;Pain;Decreased range of motion;Decreased activity tolerance;Decreased knowledge of use of DME or AE;Impaired balance (sitting and/or standing);Decreased knowledge of precautions;Impaired UE functional use;Decreased safety awareness   OT Treatment/Interventions: Self-care/ADL training;Therapeutic exercise;Therapeutic activities;Patient/family education;DME and/or AE instruction;Balance training;Manual therapy      OT Goals(Current goals can be found in the care plan section)   Acute Rehab OT Goals Patient Stated Goal: to get his methadone  OT Goal Formulation: Patient unable to participate in goal setting Time For Goal Achievement: 11/26/24 Potential to Achieve Goals: Good   OT Frequency:  Min 2X/week       AM-PAC OT 6 Clicks Daily Activity     Outcome Measure Help from another person eating meals?: None Help from another person taking care of personal grooming?: None Help from another person toileting, which includes using toliet, bedpan, or urinal?: None Help from another person bathing (including washing, rinsing, drying)?: A Little Help from another person to put on and taking off regular upper body clothing?: A Little Help from another person to put on and taking off regular lower body clothing?: A Little 6 Click Score: 21   End of Session Nurse Communication: Other (comment) (Pt concern with not receiving daily  methadone )  Activity Tolerance: Treatment limited secondary to medical complications (Comment);Other (comment) (recent onset of chest pain. No sling available in room for pt education, need for stat echo) Patient left: in bed;with call bell/phone within reach (sitting on EOB)  OT Visit Diagnosis: Unsteadiness on feet (R26.81);Muscle weakness (generalized) (M62.81);Pain Pain - Right/Left: Left Pain - part of body: Shoulder                Time: 9092-9064 OT Time Calculation (min): 28 min Charges:  OT General Charges $OT Visit: 1 Visit OT Evaluation $OT Eval Low Complexity: 1 Low OT Treatments $Self Care/Home Management : 8-22 mins  Leita Howell, OTR/L,CBIS  Supplemental OT - [facility] and [facility] Secure Chat Preferred    Deltha Bernales, Leita BIRCH 11/12/2024, 11:59 AM

## 2024-11-12 NOTE — Consult Note (Addendum)
 "  As below, patient seen and examined.  Briefly he is a 64 year old male with past medical history of hypertension, prior CVA, hyperlipidemia, substance abuse, obstructive sleep apnea, prior pulmonary embolus for evaluation of syncope.  Patient states that he has had difficulties falling.  This occurs without warning and he is unclear if he loses consciousness.  No preceding palpitations, chest pain, dyspnea, nausea.  No incontinence or seizure activity.  He feels well afterwards.  Otherwise denies exertional chest pain.  He has some dyspnea on exertion but no orthopnea or PND.  He was admitted after another fall and cardiology asked to evaluate for syncope.  Electra cardiogram shows normal sinus rhythm with PVCs.  Sodium 141, creatinine 0.60, hemoglobin 11, TSH 1.280.  Echocardiogram shows normal LV function, mild left atrial enlargement.  Shoulder x-ray shows fracture of the distal left clavicle.  1 syncope-etiology unclear.  No preceding aura or other symptoms.  Apparently unconscious for approximately 30 seconds.  His LV function is normal.  Electrocardiogram unrevealing.  Will arrange outpatient 30-[place] monitor.  He does not drive.  2 hypertension-patient's blood pressure is elevated but he only recently resumed medications.  Will follow and advance as needed.  Other issues per primary care. Patient can be discharged from a cardiac standpoint.  We will arrange follow-up with APP 2 to 4 weeks after discharge. Redell Shallow, MD  Cardiology Consultation  Patient ID: Mackay Hanauer Halbleib MRN: [id number]; DOB: [birth year]  Admit date: 11/11/2024 Date of Consult: 11/12/2024  PCP:  [name], FNP   [facility] Providers Cardiologist:  None     Patient Profile: Zykeem Bauserman Bale is a 64 y.o. male with a hx of hypertension, COPD, history of CVA 2016, hyperlipidemia, substance abuse, lymphedema, OSA awaiting CPAP, history of PE on long term OAC who is being seen 11/12/2024 for the evaluation  of syncope at the request of Dr. Tobie.  History of Present Illness: Mr. Kracht has past medical history as stated above.  He presented to the [facility] emergency department on 11/11/2024 for syncopal episodes.  He reported he has been experiencing frequent falls in the setting of syncopal episodes.  He believes that these episodes been ongoing for the last couple of weeks since he has been unable to use his CPAP for OSA.  He reports that he will suddenly lose consciousness while he was walking, often waking up on the floor.  He has a history of chronic pain, as well as opioid use disorder, this has been managed in a methadone  clinic.  He reported being on methadone  for about 1 year.  There is concern that methadone  may be contributing to his syncopal events, he had recently started on a slow taper off of the medication, presently taking 130 mg daily.  Relevant workup while in the ER includes: CBC shows chronic anemia with hemoglobin around baseline 11-10, metabolic panel showed creatinine below baseline at 0.60 (baseline 0.9-1.1), magnesium  1.9, potassium 3.7.  Head CT showed no acute intracranial abnormality, remote left posterior MCA territory infarct.  CT angio head showed: Mild intracranial atherosclerosis without a LVO or significant proximal stenosis.  Shoulder XR showed an acute fracture of the distal left clavicle.  EKG shows sinus rhythm, HR 84, signs of LVH, calculated QTc at 509 ms however this does not appear accurate upon my personal evaluation/calculation.  Echo this admission: LVEF 55 to 60%, no regional wall motion abnormalities, RV was not well-visualized, mildly dilated LA, no significant valvular abnormalities, normal IVC.  He was admitted to the medicine service in the setting of syncope with multiple falls.  Cardiology was asked to consult in the setting of syncope.  He has now been seen by a cardiologist with our office as of yet, however he was seen in the advanced hypertension  clinic by Reche Finder, NP on 10/18/2024.  He was referred from his pain clinic in December 2025 for markedly elevated BP.  At this visit he reported  falling asleep out of nowhere, reporting that he fell around 6 times just this week at this appointment.  He had reported this earlier and previously been recommended to go to the emergency department, however he never presented.  His medication regimen as of this time included: Valsartan  160 mg daily, hydralazine  50 mg twice daily.  With plans to schedule secondary hypertension workup at follow-up appointment.  In regards to his hyperlipidemia, patient noted he was not previously on a statin, they were can address this at his follow-up considering either statin or PCSK9i if intolerant to statins.  Around 9AM on 11/12/2024 patient reported chest pain, rating it 10/10, stabbing.  He received sublingual nitroglycerin  x 3 doses, chest pain improved slightly to 8/10.  EKG was obtained showing no significant ischemic changes.  Patient was then transferred to ICU, antihypertensive medication was administered.  Notes indicate that patient was send refusing all monitoring equipment, safety precautions including bed alarm, cardiac monitoring, oxygen monitoring. Upon my examination of him, he is willing to wear monitoring devices without any issues.  After speaking with the patient, he agrees with the history as stated above. He tells me that he has been having syncopal episodes frequently in the last few months. He tells me that he has no prodrome with these and typically wakes up on the floor without any memory. He lives with a roommate who he says tells him that sometimes he sleep walks as well. He has presented with various fractures, including an acute clavicle fracture. He denies any substance abuse. He reports only taking his methadone , which he is working on tapering off to a lower dose as they state this could be causing some of his symptoms. He also is working  to get set up with proper CPAP  vs BiPAP as an outpatient.   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 Authorizing Provider  methadone  (DOLOPHINE ) 10 MG/ML solution Take 130 mg by mouth daily.   Yes [provider]  albuterol  (VENTOLIN  HFA) 108 (90 Base) MCG/ACT inhaler Inhale 2 puffs into the lungs every 6 (six) hours as needed for wheezing or shortness of breath. 09/20/24   [name], FNP  DULoxetine  (CYMBALTA ) 30 MG capsule Take 1 capsule (30 mg total) by mouth daily. 09/20/24 10/20/24  [name], FNP  furosemide  (LASIX ) 40 MG tablet Take 1 tablet (40 mg total) by mouth in the morning. 09/20/24 10/20/24  [name], FNP  gabapentin  (NEURONTIN ) 100 MG capsule Take 2 capsules (200 mg total) by mouth 3 (three) times daily. 09/20/24 10/20/24  Duanne Butler DASEN, MD  hydrALAZINE  (APRESOLINE ) 50 MG tablet Take 1 tablet (50 mg total) by mouth 2 (two) times daily. 10/18/24    Vannie Reche RAMAN, NP  loratadine  (CLARITIN ) 10 MG tablet Take 1 tablet (10 mg total) by mouth daily. 09/20/24   [name], FNP  Multiple Vitamins-Minerals (ONE-A-[place] PROACTIVE 65+) TABS Take 2 tablets by mouth daily.    [provider]  rivaroxaban  (XARELTO ) 20 MG TABS tablet Take 1 tablet (20 mg total) by mouth daily with supper. 09/20/24   [name], FNP  valsartan  (DIOVAN ) 160 MG tablet Take 1 tablet (160 mg total) by mouth daily. 10/11/24   Walker, Caitlin S, NP   Scheduled Meds:  Chlorhexidine  Gluconate Cloth  6 each Topical Daily   DULoxetine   30 mg Oral Daily   fluticasone   2 spray Each Nare Daily   furosemide   40 mg Oral q AM   gabapentin   200 mg Oral TID   hydrALAZINE   50 mg Oral BID   irbesartan   150 mg Oral Daily   methadone   130 mg Oral Daily   rivaroxaban   20 mg Oral Q supper   sodium chloride  flush  3 mL Intravenous Q12H   Continuous Infusions:  PRN Meds: acetaminophen  **OR** acetaminophen , albuterol , hydrALAZINE , HYDROcodone -acetaminophen , HYDROmorphone  (DILAUDID ) injection, hydrOXYzine , nitroGLYCERIN , mouth rinse, senna-docusate, sodium chloride   Allergies:   Allergies[1]  Social History:   Social History   Socioeconomic History   Marital status: Divorced    Spouse name: Not on file   Number of children: Not on file   Years of education: Not on file   Highest education level: GED or equivalent  Occupational History   Not on file  Tobacco Use   Smoking status: Every Piche    Current packs/[place]: 1.50    Average packs/[place]: 1.5 packs/[place]io for 15.0 years (22.5 ttl pk-yrs)    Types: Cigarettes   Smokeless tobacco: Never   Tobacco comments:    increased to 1.5 pk/Kana  Vaping Use   Vaping status: Never Used  Substance and Sexual Activity   Alcohol use: No    Comment: former   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    Types: Fentanyl , Heroin, Hydrocodone , Hydromorphone , Morphine , Oxycodone     Comment: Heroin-just used 3 days ago   Sexual activity: Not  Currently  Other Topics Concern   Not on file  Social History Narrative   Not on file   Social Drivers of Health   Tobacco Use: High Risk (11/11/2024)   Patient History    Smoking Tobacco Use: Every Belk    Smokeless Tobacco Use: Never    Passive Exposure: Not on file  Financial Resource Strain: High Risk (01/10/2024)   Overall Financial Resource Strain (CARDIA)  Difficulty of Paying Living Expenses: Hard  Food Insecurity: No Food Insecurity (11/11/2024)   Epic    Worried About Programme Researcher, Broadcasting/film/video in the Last Year: Never true    Ran Out of Food in the Last Year: Never true  Recent Concern: Food Insecurity - Food Insecurity Present (08/28/2024)   Epic    Worried About Programme Researcher, Broadcasting/film/video in the Last Year: Sometimes true    Ran Out of Food in the Last Year: Sometimes true  Transportation Needs: No Transportation Needs (11/11/2024)   Epic    Lack of Transportation (Medical): No    Lack of Transportation (Non-Medical): No  Recent Concern: Transportation Needs - Unmet Transportation Needs (10/09/2024)   Epic    Lack of Transportation (Medical): Yes    Lack of Transportation (Non-Medical): Yes  Physical Activity: Unknown (01/10/2024)   Exercise Vital Sign    Days of Exercise per Week: 0 days    Minutes of Exercise per Session: Not on file  Stress: Stress Concern Present (01/10/2024)   [employer] of Occupational Health - Occupational Stress Questionnaire    Feeling of Stress : Rather much  Social Connections: Socially Isolated (11/11/2024)   Social Connection and Isolation Panel    Frequency of Communication with Friends and Family: Once a week    Frequency of Social Gatherings with Friends and Family: Never    Attends Religious Services: Never    Database Administrator or Organizations: No    Attends Banker Meetings: Never    Marital Status: Divorced  Catering Manager Violence: Not At Risk (11/11/2024)   Epic    Fear of Current or Ex-Partner: No    Emotionally Abused:  No    Physically Abused: No    Sexually Abused: No  Depression (PHQ2-9): High Risk (07/22/2024)   Depression (PHQ2-9)    PHQ-2 Score: 17  Alcohol Screen: Not on file  Housing: Low Risk (11/11/2024)   Epic    Unable to Pay for Housing in the Last Year: No    Number of Times Moved in the Last Year: 0    Homeless in the Last Year: No  Utilities: Not At Risk (11/11/2024)   Epic    Threatened with loss of utilities: No  Health Literacy: Not on file    Family History:   Family History  Problem Relation Age of Onset   Heart failure Mother    Hypertension Mother    Cancer Mother    Hypertension Father    Depression Sister     ROS:  Please see the history of present illness.  All other ROS reviewed and negative.     Physical Exam/Data: Vitals:   11/12/24 0502 11/12/24 0813 11/12/24 0828 11/12/24 1054  BP: (!) 178/95 (!) 212/111 (!) 173/88 (!) 174/94  Pulse: 79     Resp: 20   19  Temp: 98.1 F (36.7 C)     TempSrc: Oral     SpO2: 97%     Weight:      Height:        Intake/Output Summary (Last 24 hours) at 11/12/2024 1155 Last data filed at 11/12/2024 0600 Gross per 24 hour  Intake 360 ml  Output --  Net 360 ml      11/11/2024   10:31 AM 10/18/2024   12:55 PM 07/22/2024    9:54 AM  Last 3 Weights  Weight (lbs) 250 lb 276 lb 11.2 oz 277 lb 3.2 oz  Weight (kg) 113.399  kg 125.51 kg 125.737 kg     Body mass index is 34.87 kg/m.   General:  Well nourished, well developed, in no acute distress HEENT: normal Neck: no JVD Vascular: No carotid bruits; Distal pulses 2+ bilaterally Cardiac:  normal S1, S2; RRR; no murmur  Lungs:  clear to auscultation bilaterally Abd: soft, nontender, no hepatomegaly  Ext: no edema Musculoskeletal:  No deformities Skin: warm and dry  Neuro:  no focal abnormalities noted Psych:  Normal affect   EKG:  The EKG was personally reviewed and demonstrates:  sinus rhythm  Telemetry:  Telemetry was personally reviewed and demonstrates:  N/A, patient  refusing to wear  Relevant CV Studies:  Echocardiogram, 11/12/2024 Left ventricular ejection fraction, by estimation, is 55 to 60% . The left ventricle has normal function. The left ventricle has no regional wall motion abnormalities. Left ventricular diastolic parameters were normal.  Right ventricular systolic function was not well visualized. The right ventricular size is not well visualized.  Left atrial size was mildly dilated.  The mitral valve is normal in structure. No evidence of mitral valve regurgitation. No evidence of mitral stenosis.  The aortic valve is tricuspid. Aortic valve regurgitation is not visualized. Aortic valve sclerosis is present, with no evidence of aortic valve stenosis.  The inferior vena cava is normal in size with greater than 50% respiratory variability, suggesting right atrial pressure of 3 mmHg.  Laboratory Data: High Sensitivity Troponin:   Recent Labs  Lab 10/22/24 0837  TROPONINIHS 12    Recent Labs  Lab 11/12/24 1002  TRNPT 41*      Chemistry Recent Labs  Lab 11/11/24 1114 11/11/24 1708 11/12/24 0317  NA 141  --  141  K 3.7  --  3.9  CL 107  --  107  CO2 25  --  25  GLUCOSE 99  --  80  BUN 8  --  7*  CREATININE 0.60*  --  0.63  CALCIUM  8.4*  --  8.5*  MG  --  1.9  --   GFRNONAA >60  --  >60  ANIONGAP 10  --  9    Recent Labs  Lab 11/11/24 1114  PROT 6.7  ALBUMIN 3.4*  AST 15  ALT 10  ALKPHOS 100  BILITOT 0.4   Lipids No results for input(s): CHOL, TRIG, HDL, LABVLDL, LDLCALC, CHOLHDL in the last 168 hours.  Hematology Recent Labs  Lab 11/11/24 1114 11/12/24 0317  WBC 9.4 8.3  RBC 3.67* 3.63*  HGB 11.0* 10.7*  HCT 33.8* 33.8*  MCV 92.1 93.1  MCH 30.0 29.5  MCHC 32.5 31.7  RDW 15.5 15.6*  PLT 241 233   Thyroid  No results for input(s): TSH, FREET4 in the last 168 hours.  BNPNo results for input(s): BNP, PROBNP in the last 168 hours.  DDimer  Recent Labs  Lab 11/12/24 1002  DDIMER 1.69*     Radiology/Studies:  ECHOCARDIOGRAM COMPLETE Result Date: 11/12/2024    ECHOCARDIOGRAM REPORT   Patient Name:   KONSTANTINE GERVASI Nourse Date of Exam: 11/12/2024 Medical Rec #:  [id number]         Height:       71.0 in Accession #:    [id number]        Weight:       250.0 lb Date of Birth:  1961/10/19         BSA:          2.318 m Patient Age:    56 years  BP:           178/95 mmHg Patient Gender: M                 HR:           69 bpm. Exam Location:  Inpatient Procedure: 2D Echo, Cardiac Doppler, Color Doppler and Intracardiac            Opacification Agent (Both Spectral and Color Flow Doppler were            utilized during procedure). Indications:    Syncope  History:        Patient has prior history of Echocardiogram examinations, most                 recent 07/01/2024. COPD, Signs/Symptoms:Syncope; Risk                 Factors:Hypertension, Dyslipidemia and Sleep Apnea.  Sonographer:    Philomena Daring Referring Phys: [id number] VISHAL R PATEL IMPRESSIONS  1. Left ventricular ejection fraction, by estimation, is 55 to 60%. The left ventricle has normal function. The left ventricle has no regional wall motion abnormalities. Left ventricular diastolic parameters were normal.  2. Right ventricular systolic function was not well visualized. The right ventricular size is not well visualized.  3. Left atrial size was mildly dilated.  4. The mitral valve is normal in structure. No evidence of mitral valve regurgitation. No evidence of mitral stenosis.  5. The aortic valve is tricuspid. Aortic valve regurgitation is not visualized. Aortic valve sclerosis is present, with no evidence of aortic valve stenosis.  6. The inferior vena cava is normal in size with greater than 50% respiratory variability, suggesting right atrial pressure of 3 mmHg. Comparison(s): Prior images reviewed side by side. Function assessment has improved with use of imaging enhancing agent. FINDINGS  Left Ventricle: Left ventricular ejection  fraction, by estimation, is 55 to 60%. The left ventricle has normal function. The left ventricle has no regional wall motion abnormalities. Definity  contrast agent was given IV to delineate the left ventricular  endocardial borders. The left ventricular internal cavity size was normal in size. There is no left ventricular hypertrophy. Left ventricular diastolic parameters were normal. Right Ventricle: The right ventricular size is not well visualized. No increase in right ventricular wall thickness. Right ventricular systolic function was not well visualized. Left Atrium: Left atrial size was mildly dilated. Right Atrium: Right atrial size was normal in size. Pericardium: There is no evidence of pericardial effusion. Mitral Valve: The mitral valve is normal in structure. No evidence of mitral valve regurgitation. No evidence of mitral valve stenosis. Tricuspid Valve: The tricuspid valve is normal in structure. Tricuspid valve regurgitation is trivial. No evidence of tricuspid stenosis. Aortic Valve: The aortic valve is tricuspid. Aortic valve regurgitation is not visualized. Aortic valve sclerosis is present, with no evidence of aortic valve stenosis. Aortic valve mean gradient measures 6.0 mmHg. Aortic valve peak gradient measures 11.3 mmHg. Aortic valve area, by VTI measures 2.59 cm. Pulmonic Valve: The pulmonic valve was normal in structure. Pulmonic valve regurgitation is not visualized. No evidence of pulmonic stenosis. Aorta: The aortic root and ascending aorta are structurally normal, with no evidence of dilitation. Venous: The inferior vena cava is normal in size with greater than 50% respiratory variability, suggesting right atrial pressure of 3 mmHg. IAS/Shunts: The interatrial septum was not well visualized.  LEFT VENTRICLE PLAX 2D LVIDd:         5.30 cm  Diastology LVIDs:         3.90 cm   LV e' medial:    8.70 cm/s LV PW:         1.10 cm   LV E/e' medial:  12.2 LV IVS:        1.10 cm   LV e' lateral:    12.70 cm/s LVOT diam:     2.10 cm   LV E/e' lateral: 8.3 LV SV:         76 LV SV Index:   33 LVOT Area:     3.46 cm  IVC IVC diam: 1.70 cm  PULMONARY VEINS Diastolic Velocity: 66.00 cm/s S/D Velocity:       1.20 Systolic Velocity:  79.10 cm/s LEFT ATRIUM             Index        RIGHT ATRIUM           Index LA diam:        4.30 cm 1.85 cm/m   RA Area:     18.50 cm LA Vol (A2C):   85.9 ml 37.05 ml/m  RA Volume:   47.20 ml  20.36 ml/m LA Vol (A4C):   72.4 ml 31.23 ml/m LA Biplane Vol: 81.3 ml 35.07 ml/m  AORTIC VALVE AV Area (Vmax):    2.35 cm AV Area (Vmean):   2.24 cm AV Area (VTI):     2.59 cm AV Vmax:           168.00 cm/s AV Vmean:          114.000 cm/s AV VTI:            0.294 m AV Peak Grad:      11.3 mmHg AV Mean Grad:      6.0 mmHg LVOT Vmax:         114.00 cm/s LVOT Vmean:        73.800 cm/s LVOT VTI:          0.220 m LVOT/AV VTI ratio: 0.75  AORTA Ao Root diam: 3.00 cm Ao Asc diam:  3.60 cm MITRAL VALVE                TRICUSPID VALVE MV Area (PHT): 3.42 cm     TR Peak grad:   9.9 mmHg MV Decel Time: 222 msec     TR Vmax:        157.00 cm/s MV E velocity: 106.00 cm/s MV A velocity: 117.00 cm/s  SHUNTS MV E/A ratio:  0.91         Systemic VTI:  0.22 m                             Systemic Diam: 2.10 cm Stanly Leavens MD Electronically signed by Stanly Leavens MD Signature Date/Time: 11/12/2024/10:20:55 AM    Final    DG Shoulder Left Result Date: 11/11/2024 CLINICAL DATA:  Status post fall. EXAM: DG SHOULDER 2+V*L* COMPARISON:  None Available. FINDINGS: There is an acute, mildly displaced fracture deformity involving the dorsal aspect of the distal left clavicle, at the acromioclavicular joint. There is no evidence of dislocation. There is no evidence of arthropathy or other focal bone abnormality. Soft tissues are unremarkable. IMPRESSION: Acute fracture of the distal left clavicle. Electronically Signed   By: Suzen Dials M.D.   On: 11/11/2024 18:14   CT Angio Head W or Wo  Contrast Result Date: 11/11/2024 EXAM: CTA  Head without and with Intravenous Contrast CLINICAL HISTORY: Stroke, follow up. TECHNIQUE: Axial CTA images of the head without and with intravenous contrast. MIP reconstructed images were created and reviewed. Dose reduction technique was used including one or more of the following: automated exposure control, adjustment of mA and kV according to patient size, and/or iterative reconstruction. CONTRAST: Without and with; 75 mL (iohexol  (OMNIPAQUE ) 350 MG/ML injection 75 mL IOHEXOL  350 MG/ML SOLN). COMPARISON: MRA head 09/18/2015. FINDINGS: ANTERIOR CIRCULATION: The intracranial internal carotid arteries are patent with mild atherosclerotic calcification bilaterally not resulting in a significant stenosis. ACAs and MCAs are patent without evidence of a proximal branch occlusion or significant proximal stenosis. There is mild asymmetric attenuation of left MCA branch vessels in the setting of a chronic infarct. The right A1 segment is hypoplastic. POSTERIOR CIRCULATION: The intracranial vertebral arteries are patent with the right being strongly dominant and the left being diffusely hypoplastic. A small amount of calcified plaque in the right V4 segment does not result in significant stenosis. Patent PICA and SCA origins are visualized bilaterally. The basilar artery is widely patent. There are moderate sized right and small left posterior communicating arteries. Both PCAs are patent without evidence of a significant proximal stenosis. No aneurysm. The dural venous sinuses are patent. IMPRESSION: 1. Mild intracranial atherosclerosis without a large vessel occlusion or significant proximal stenosis. Electronically signed by: Dasie Hamburg MD 11/11/2024 05:57 PM EST RP Workstation: [id number]   CT HEAD WO CONTRAST Result Date: 11/11/2024 EXAM: CT HEAD WITHOUT 11/11/2024 03:07:54 PM TECHNIQUE: CT of the head was performed without the administration of intravenous contrast.  Automated exposure control, iterative reconstruction, and/or weight based adjustment of the mA/kV was utilized to reduce the radiation dose to as low as reasonably achievable. COMPARISON: CT head 07/11/2023. CLINICAL HISTORY: Neuro deficit, acute, stroke suspected FINDINGS: BRAIN AND VENTRICLES: No acute intracranial hemorrhage. No mass effect or midline shift. No extra-axial fluid collection. No evidence of acute infarct. Remote left posterior MCA territory infarct. No hydrocephalus. ORBITS: No acute abnormality. SINUSES AND MASTOIDS: No acute abnormality. SOFT TISSUES AND SKULL: No acute skull fracture. No acute soft tissue abnormality. IMPRESSION: 1. No acute intracranial abnormality. 2. Remote left posterior MCA territory infarct. Electronically signed by: Gilmore Molt 11/11/2024 03:40 PM EST RP Workstation: [id number]   Assessment and Plan:  Syncope, recurrent  Presented to ER with history of several episodes of syncope/falls Patient reports no prodrome Patient believes it to be secondary to inability to use CPAP EKG showed sinus rhythm, no conduction disease QTc reported as prolonged, but appears normal on my personal review  Electrolytes normal   TSH normal 10/2024 Patient has been refusing cardiac telemetry Echo this admission: LVEF 55 to 60%, no RWMA, mildly dilated LA, no valvular abnormalities, normal IVC Patient reports tapering off methadone  as an outpatient to aid in any issues with this dosing  Remain on telemetry  Pending orthostatic vital signs  Patient will need long-term monitoring outpatient if telemetry is unremarkable -- long term monitor vs ILR for recurrent syncopal episodes   Hypertension  Seen in our advanced hypertension clinic 10/2024 Home meds: Valsartan  160 mg daily, hydralazine  50 mg BID Already has follow-up with our advanced hypertension clinic February 2026 BP remains elevated with SBP as high as 212 this AM Currently on hydralazine  50 mg twice  daily Currently on irbesartan  150 mg daily Currently on PO Lasix  40 mg daily Room to increase antihypertensive therapy  Chest pain Had episode of chest pain this morning Reported 10/10  chest pain episode  EKG showed no significant ischemic changes Echo showed normal EF, no regional wall motion abnormalities Suspect chest pain cycle in the setting of severely uncontrolled BP SBP this morning up to 212 He tells me that his chest pain is worse with inspiration and believes it could be secondary to his clavicle fracture   No concerning cardiac chest pain   OSA Previously on CPAP Reports some issues with sleep study prior to being prescribed CPAP Awaiting further, repeat sleep study to determine BiPAP vs CPAP use Will inquire about upcoming sleep study as patient states it is scheduled thru our office   Per primary Acute fracture of distal left clavicle COPD History of PE/DVT History of CVA Substance abuse disorder Chronic pain syndrome Chronic lymphedema Current tobacco use  Risk Assessment/Risk Scores:       For questions or updates, please contact [facility] Please consult [email or web address] for contact info under   Signed, Waddell DELENA Donath, PA-C  11/12/2024 11:55 AM     [1]  Allergies Allergen Reactions   Amlodipine  Swelling    BLE peripheral edema    "

## 2024-11-12 NOTE — Progress Notes (Signed)
 Ordering 30 Cordner cardiac monitor, in setting of syncope. Results to Dr. Pietro

## 2024-11-12 NOTE — Progress Notes (Addendum)
 Pt is A&O x4 with hx of more than one fall within 6 month prior to admission, indicating a high fall risk per protocol. This RN discussed the identified risk factors with the pt, including: hx of fall, unsteady gait, medication side effects. Also, discussed to pt of potential severe outcomes of fall, such as fractures and head injuries. The following fall prevention strategies were reviewed and offered: Bed alarm on- pt refused Pt stated,  I don't need alarm, I am a grown man who can get up myself. side rails up (explained risk)- pt insisted down, wearing non-skid socks-pt refused, utilizing call bell for assistance before getting out of bed- pt refusing to use call bell. Floor mat- pt refused. Plan of care ongoing.

## 2024-11-12 NOTE — Progress Notes (Signed)
 Orthopedic Tech Progress Note Patient Details:  [name] [name] 1961-04-05 [id number]  Ortho Devices Type of Ortho Device: Arm sling Ortho Device/Splint Interventions: Ordered and left at bedside      [name] 11/12/2024, 9:58 AM

## 2024-11-12 NOTE — Progress Notes (Signed)
 Was informed upon returning from lunch that patient refused unna boot changes by the ortho tech and told her to leave his room. According to the patient the dressings were not the same as what he usually receives in the outpatient clinic. Per staff in the room at the time dressings were the same and patient was shown the dressing that were removed by me. New WOC order placed so that issues can be addressed tomorrow. Will wrap patients legs in kerlix until issue re-assessed by wound care.

## 2024-11-12 NOTE — Progress Notes (Addendum)
 " PROGRESS NOTE    [name] [name]  FMW:[id number] DOB: [birth year] DOA: 11/11/2024 PCP: [name], FNP    Brief Narrative:   [name] [name] is a 64 y.o. 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 Patient correlates that these events have been ongoing for the last few weeks ever since he has been unable to use his CPAP.  Patient states that he has not been able to use his nasal pillows due to stuffed up nose. Patient states that he will suddenly lose consciousness when he is walking.  He will wake up on the floor.  He denies any prodrome or warning symptoms.  He has not had any recent lightheadedness, dizziness, chest pain, palpitations.  He reports chronic baseline dyspnea related to COPD.  He has chronic swelling to lower extremities due to lymphedema which is not significantly changed.    He has chronic pain and a history of opioid use disorder which has been managed by the methadone  clinic.  He says he has been on methadone  for about 1 year.  There was concern that the methadone  was contributing to his syncopal events and he has recently started on a slow taper from 140 mg daily and currently taking 130 mg daily.   He has been having left shoulder pain after one of the recent falls.   In the ED, BP 191/96, pulse 84, RR 20, temp 98.3 F, SpO2 94% on room air. WBC 9.4, hemoglobin 11.0, platelets 241, sodium 141, potassium 3.7, bicarb 25, BUN 8, creatinine 0.60, serum glucose 99, LFTs within normal limits, magnesium  1.9. CT head without contrast negative for acute intracranial abnormality.  Remote left posterior MCA territory infarct  noted. CTA head showed mild intracranial atherosclerosis without a large vessel occlusion or significant proximal stenosis. Left shoulder x-ray showed an acute fracture of the distal left clavicle. EDP discussed with cardiology who recommended medical admission and they will see in consultation in the morning.  The hospitalist service was consulted for admission.  Assessment & Plan:   Recurrent syncope with collapse Elevated D-dimer Patient reports frequent syncopal episodes without prodrome.  Has fallen multiple times due to these events.  EKG shows prolonged QTc 509 ms.  TTE with LVEF 55 to 60%, LV with normal function and no regional wall motion normalities, diastolic parameters normal, LA mildly dilated, no MR, no aortic stenosis, IVC normal in size.  D-dimer elevated 1.69.  Seen by cardiology and recommendation of outpatient cardiac monitor. -- Continue to monitor on telemetry -- Vascular duplex ultrasound bilateral lower extremities to rule out DVT -- CT angiogram chest to rule out pulmonary embolism -- orthostatic vitals ordered and pending -- PT/OT eval  Prolonged QTc: Repeat EKG shows resolution of QTc prolongation.  Resuming home methadone .   Chronic HFpEF, compensated Hypertension TTE with LVEF 55 to 60%, LV with normal function and no regional wall motion normalities, diastolic parameters normal, LA mildly dilated, no MR, no aortic stenosis, IVC normal in size. -- Hydralazine  50 g p.o. twice daily -- Furosemide  40 mg p.o. daily -- Irbesartan  150 mg p.o. daily (substituted for home Valsartan ) -- Hydralazine  10 mg IV every 6 hours as needed SBP greater than 165  Addendum: BP remains poorly controlled. -- Increased hydralazine  to 75 mg PO TID -- Add labetalol  20 mg IV every 6 hours as needed SBP greater than 165 if resistant to IV hydralazine    COPD, stable -- Continue albuterol  every 6 hours as needed shortness of breath/wheezing   History of PE/DVT: -- Continue Xarelto .    History of CVA: -- Continue Xarelto .  Does not appear to be on statin.   Acute fracture of distal left clavicle: Occurring after fall.  X-ray shows mild displacement, no dislocation.  Applied shoulder sling for comfort.   -- PT/OT evaluation  Substance use disorder/chronic pain syndrome: Follows with the methadone  clinic and has recently started on taper of methadone  from 140 mg daily, now takes 130 mg daily.   -- Outpatient follow-up with methadone  clinic  Chronic lymphedema and nonhealing wounds of both lower extremities: Chronic and appears stable.  Follows with the wound care center. -- Wound RN consulted  Anxiety/depression -- Cymbalta  30 m p.o. daily   OSA: -- Continue CPAP nightly.   Tobacco use: Patient reports he has cut down from 3 PPD to 1 PPD.  He declines nicotine  patch.   Obesity, class I Body mass index is 34.87 kg/m.   DVT prophylaxis:  rivaroxaban  (XARELTO ) tablet 20 mg    Code Status: Full Code Family Communication: No family present at bedside this morning  Disposition Plan:  Level of care: Stepdown Status is: Observation The patient remains OBS appropriate and will d/c before 2 midnights.    Consultants:  Cardiology  Procedures:  TTE Vascular duplex ultrasound bilateral lower extremities: Pending  Antimicrobials:  None   Subjective: Patient seen and examined bedside, complaining of chest pain.  Rapid response initiated this morning.  Transferred to stepdown unit.  Seen by cardiology, reviewed echo with no concerns with plan for outpatient cardiac monitor.  Blood pressure remains elevated, restarted home antihypertensives.  D-dimer elevated, will obtain CT angiogram chest and ultrasound lower extremities to rule out PE/DVT; although on Xarelto  outpatient, but query compliance.  No other complaints or concerns at this time.  Denies headache, no visual changes, no current shortness of breath, no abdominal pain, no fever/chills/night sweats, no  nausea/vomit/diarrhea, no focal weakness, no fatigue, no paresthesias.  No other acute events overnight per nursing staff.  Objective: Vitals:   11/12/24 0502 11/12/24 0813 11/12/24 0828 11/12/24 1054  BP: (!) 178/95 (!) 212/111 (!) 173/88 (!) 174/94  Pulse: 79     Resp: 20   19  Temp: 98.1 F (36.7 C)     TempSrc: Oral     SpO2: 97%     Weight:      Height:        Intake/Output Summary (Last 24 hours) at 11/12/2024 1259 Last data filed at 11/12/2024 0600 Gross per 24 hour  Intake 360 ml  Output --  Net 360 ml   Filed Weights   11/11/24 1031  Weight: 113.4 kg    Examination:  Physical Exam: GEN: NAD, alert and oriented x 3, obese HEENT: NCAT, PERRL, EOMI, sclera clear, MMM PULM: CTAB w/o wheezes/crackles, normal respiratory effort, on room air CV: RRR w/o M/G/R GI: abd soft, NTND, + BS MSK: + LE edema w/ wounds; currently with Unna boots NEURO: No focal neurodeficit PSYCH: Agitated mood Integumentary: Bilateral lower extremity wounds with Unna boots in place, right elbow abrasion as below    Data Reviewed: I have personally reviewed following labs and imaging studies  CBC: Recent Labs  Lab 11/11/24  1114 11/12/24 0317  WBC 9.4 8.3  NEUTROABS 6.6  --   HGB 11.0* 10.7*  HCT 33.8* 33.8*  MCV 92.1 93.1  PLT 241 233   Basic Metabolic Panel: Recent Labs  Lab 11/11/24 1114 11/11/24 1708 11/12/24 0317  NA 141  --  141  K 3.7  --  3.9  CL 107  --  107  CO2 25  --  25  GLUCOSE 99  --  80  BUN 8  --  7*  CREATININE 0.60*  --  0.63  CALCIUM  8.4*  --  8.5*  MG  --  1.9  --    GFR: Estimated Creatinine Clearance: 122.6 mL/min (by C-G formula based on SCr of 0.63 mg/dL). Liver Function Tests: Recent Labs  Lab 11/11/24 1114  AST 15  ALT 10  ALKPHOS 100  BILITOT 0.4  PROT 6.7  ALBUMIN 3.4*   No results for input(s): LIPASE, AMYLASE in the last 168 hours. No results for input(s): AMMONIA in the last 168 hours. Coagulation Profile: Recent Labs   Lab 11/11/24 1423  INR 1.1   Cardiac Enzymes: No results for input(s): CKTOTAL, CKMB, CKMBINDEX, TROPONINI in the last 168 hours. BNP (last 3 results) No results for input(s): PROBNP in the last 8760 hours. HbA1C: No results for input(s): HGBA1C in the last 72 hours. CBG: Recent Labs  Lab 11/11/24 1429 11/12/24 0448  GLUCAP 95 75   Lipid Profile: No results for input(s): CHOL, HDL, LDLCALC, TRIG, CHOLHDL, LDLDIRECT in the last 72 hours. Thyroid  Function Tests: No results for input(s): TSH, T4TOTAL, FREET4, T3FREE, THYROIDAB in the last 72 hours. Anemia Panel: No results for input(s): VITAMINB12, FOLATE, FERRITIN, TIBC, IRON, RETICCTPCT in the last 72 hours. Sepsis Labs: No results for input(s): PROCALCITON, LATICACIDVEN in the last 168 hours.  No results found for this or any previous visit (from the past 240 hours).       Radiology Studies: ECHOCARDIOGRAM COMPLETE Result Date: 11/12/2024    ECHOCARDIOGRAM REPORT   Patient Name:   ASHWIN TIBBS Brophy Date of Exam: 11/12/2024 Medical Rec #:  [id number]         Height:       71.0 in Accession #:    [id number]        Weight:       250.0 lb Date of Birth:  1961-02-14         BSA:          2.318 m Patient Age:    63 years          BP:           178/95 mmHg Patient Gender: M                 HR:           69 bpm. Exam Location:  Inpatient Procedure: 2D Echo, Cardiac Doppler, Color Doppler and Intracardiac            Opacification Agent (Both Spectral and Color Flow Doppler were            utilized during procedure). Indications:    Syncope  History:        Patient has prior history of Echocardiogram examinations, most                 recent 07/01/2024. COPD, Signs/Symptoms:Syncope; Risk                 Factors:Hypertension, Dyslipidemia and Sleep Apnea.  Sonographer:  Philomena Daring Referring Phys: [id number] VISHAL R PATEL IMPRESSIONS  1. Left ventricular ejection fraction, by estimation, is 55 to  60%. The left ventricle has normal function. The left ventricle has no regional wall motion abnormalities. Left ventricular diastolic parameters were normal.  2. Right ventricular systolic function was not well visualized. The right ventricular size is not well visualized.  3. Left atrial size was mildly dilated.  4. The mitral valve is normal in structure. No evidence of mitral valve regurgitation. No evidence of mitral stenosis.  5. The aortic valve is tricuspid. Aortic valve regurgitation is not visualized. Aortic valve sclerosis is present, with no evidence of aortic valve stenosis.  6. The inferior vena cava is normal in size with greater than 50% respiratory variability, suggesting right atrial pressure of 3 mmHg. Comparison(s): Prior images reviewed side by side. Function assessment has improved with use of imaging enhancing agent. FINDINGS  Left Ventricle: Left ventricular ejection fraction, by estimation, is 55 to 60%. The left ventricle has normal function. The left ventricle has no regional wall motion abnormalities. Definity  contrast agent was given IV to delineate the left ventricular  endocardial borders. The left ventricular internal cavity size was normal in size. There is no left ventricular hypertrophy. Left ventricular diastolic parameters were normal. Right Ventricle: The right ventricular size is not well visualized. No increase in right ventricular wall thickness. Right ventricular systolic function was not well visualized. Left Atrium: Left atrial size was mildly dilated. Right Atrium: Right atrial size was normal in size. Pericardium: There is no evidence of pericardial effusion. Mitral Valve: The mitral valve is normal in structure. No evidence of mitral valve regurgitation. No evidence of mitral valve stenosis. Tricuspid Valve: The tricuspid valve is normal in structure. Tricuspid valve regurgitation is trivial. No evidence of tricuspid stenosis. Aortic Valve: The aortic valve is tricuspid.  Aortic valve regurgitation is not visualized. Aortic valve sclerosis is present, with no evidence of aortic valve stenosis. Aortic valve mean gradient measures 6.0 mmHg. Aortic valve peak gradient measures 11.3 mmHg. Aortic valve area, by VTI measures 2.59 cm. Pulmonic Valve: The pulmonic valve was normal in structure. Pulmonic valve regurgitation is not visualized. No evidence of pulmonic stenosis. Aorta: The aortic root and ascending aorta are structurally normal, with no evidence of dilitation. Venous: The inferior vena cava is normal in size with greater than 50% respiratory variability, suggesting right atrial pressure of 3 mmHg. IAS/Shunts: The interatrial septum was not well visualized.  LEFT VENTRICLE PLAX 2D LVIDd:         5.30 cm   Diastology LVIDs:         3.90 cm   LV e' medial:    8.70 cm/s LV PW:         1.10 cm   LV E/e' medial:  12.2 LV IVS:        1.10 cm   LV e' lateral:   12.70 cm/s LVOT diam:     2.10 cm   LV E/e' lateral: 8.3 LV SV:         76 LV SV Index:   33 LVOT Area:     3.46 cm  IVC IVC diam: 1.70 cm  PULMONARY VEINS Diastolic Velocity: 66.00 cm/s S/D Velocity:       1.20 Systolic Velocity:  79.10 cm/s LEFT ATRIUM             Index        RIGHT ATRIUM  Index LA diam:        4.30 cm 1.85 cm/m   RA Area:     18.50 cm LA Vol (A2C):   85.9 ml 37.05 ml/m  RA Volume:   47.20 ml  20.36 ml/m LA Vol (A4C):   72.4 ml 31.23 ml/m LA Biplane Vol: 81.3 ml 35.07 ml/m  AORTIC VALVE AV Area (Vmax):    2.35 cm AV Area (Vmean):   2.24 cm AV Area (VTI):     2.59 cm AV Vmax:           168.00 cm/s AV Vmean:          114.000 cm/s AV VTI:            0.294 m AV Peak Grad:      11.3 mmHg AV Mean Grad:      6.0 mmHg LVOT Vmax:         114.00 cm/s LVOT Vmean:        73.800 cm/s LVOT VTI:          0.220 m LVOT/AV VTI ratio: 0.75  AORTA Ao Root diam: 3.00 cm Ao Asc diam:  3.60 cm MITRAL VALVE                TRICUSPID VALVE MV Area (PHT): 3.42 cm     TR Peak grad:   9.9 mmHg MV Decel Time: 222 msec      TR Vmax:        157.00 cm/s MV E velocity: 106.00 cm/s MV A velocity: 117.00 cm/s  SHUNTS MV E/A ratio:  0.91         Systemic VTI:  0.22 m                             Systemic Diam: 2.10 cm Stanly Leavens MD Electronically signed by Stanly Leavens MD Signature Date/Time: 11/12/2024/10:20:55 AM    Final    DG Shoulder Left Result Date: 11/11/2024 CLINICAL DATA:  Status post fall. EXAM: DG SHOULDER 2+V*L* COMPARISON:  None Available. FINDINGS: There is an acute, mildly displaced fracture deformity involving the dorsal aspect of the distal left clavicle, at the acromioclavicular joint. There is no evidence of dislocation. There is no evidence of arthropathy or other focal bone abnormality. Soft tissues are unremarkable. IMPRESSION: Acute fracture of the distal left clavicle. Electronically Signed   By: Suzen Dials M.D.   On: 11/11/2024 18:14   CT Angio Head W or Wo Contrast Result Date: 11/11/2024 EXAM: CTA Head without and with Intravenous Contrast CLINICAL HISTORY: Stroke, follow up. TECHNIQUE: Axial CTA images of the head without and with intravenous contrast. MIP reconstructed images were created and reviewed. Dose reduction technique was used including one or more of the following: automated exposure control, adjustment of mA and kV according to patient size, and/or iterative reconstruction. CONTRAST: Without and with; 75 mL (iohexol  (OMNIPAQUE ) 350 MG/ML injection 75 mL IOHEXOL  350 MG/ML SOLN). COMPARISON: MRA head 09/18/2015. FINDINGS: ANTERIOR CIRCULATION: The intracranial internal carotid arteries are patent with mild atherosclerotic calcification bilaterally not resulting in a significant stenosis. ACAs and MCAs are patent without evidence of a proximal branch occlusion or significant proximal stenosis. There is mild asymmetric attenuation of left MCA branch vessels in the setting of a chronic infarct. The right A1 segment is hypoplastic. POSTERIOR CIRCULATION: The intracranial  vertebral arteries are patent with the right being strongly dominant and the left being diffusely hypoplastic. A small amount  of calcified plaque in the right V4 segment does not result in significant stenosis. Patent PICA and SCA origins are visualized bilaterally. The basilar artery is widely patent. There are moderate sized right and small left posterior communicating arteries. Both PCAs are patent without evidence of a significant proximal stenosis. No aneurysm. The dural venous sinuses are patent. IMPRESSION: 1. Mild intracranial atherosclerosis without a large vessel occlusion or significant proximal stenosis. Electronically signed by: Dasie Hamburg MD 11/11/2024 05:57 PM EST RP Workstation: [id number]   CT HEAD WO CONTRAST Result Date: 11/11/2024 EXAM: CT HEAD WITHOUT 11/11/2024 03:07:54 PM TECHNIQUE: CT of the head was performed without the administration of intravenous contrast. Automated exposure control, iterative reconstruction, and/or weight based adjustment of the mA/kV was utilized to reduce the radiation dose to as low as reasonably achievable. COMPARISON: CT head 07/11/2023. CLINICAL HISTORY: Neuro deficit, acute, stroke suspected FINDINGS: BRAIN AND VENTRICLES: No acute intracranial hemorrhage. No mass effect or midline shift. No extra-axial fluid collection. No evidence of acute infarct. Remote left posterior MCA territory infarct. No hydrocephalus. ORBITS: No acute abnormality. SINUSES AND MASTOIDS: No acute abnormality. SOFT TISSUES AND SKULL: No acute skull fracture. No acute soft tissue abnormality. IMPRESSION: 1. No acute intracranial abnormality. 2. Remote left posterior MCA territory infarct. Electronically signed by: Gilmore Molt 11/11/2024 03:40 PM EST RP Workstation: [id number]        Scheduled Meds:  Chlorhexidine  Gluconate Cloth  6 each Topical Daily   DULoxetine   30 mg Oral Daily   fluticasone   2 spray Each Nare Daily   furosemide   40 mg Oral q AM   gabapentin   200 mg  Oral TID   hydrALAZINE   50 mg Oral BID   irbesartan   150 mg Oral Daily   methadone   130 mg Oral Daily   rivaroxaban   20 mg Oral Q supper   sodium chloride  flush  3 mL Intravenous Q12H   Continuous Infusions:   LOS: 0 days    Time spent: 52 minutes spent on 11/12/2024 caring for this patient face-to-face including chart review, ordering labs/tests, documenting, discussion with nursing staff, consultants, updating family and interview/physical exam    Camellia PARAS Keimon Basaldua, DO [facility] Available via Epic secure chat 7am-7pm After these hours, please refer to coverage provider listed on [email or web address] 11/12/2024, 1:00 PM   "

## 2024-11-12 NOTE — Progress Notes (Addendum)
 Pt observed ambulating independently to the bathroom without incident at 2352, 0116 and 0314. Reassessed fall risk and pt willingness to to use safety measures each time. Pt continued to refuse all fall precaution. Plan of care ongoing. Notified [facility] floor coverage NP.

## 2024-11-12 NOTE — Progress Notes (Addendum)
 Patient received to SD after rapid response for chest pain. Patient refusing all monitoring equipment and safety precautions including bed alarm, cardiac monitoring and oxygen monitoring. Attempted to explain reason for monitoring equipment and provide safety education.   Discussed in depth with patient a second time, patient willing to wear monitoring equipment while in bed and chair. Explained the rationale regarding wearing equipment while ambulating to bathroom of monitoring rate as he has had syncopal events in the past. He verbalized that he is willing to take risk of falls or unwitnessed events related to disconnecting monitoring equipment. Patient placed on tele monitor, BP and SpO2 checked. See flowsheets.   [place] MD notified.

## 2024-11-12 NOTE — Progress Notes (Signed)
 Orthopedic Tech Progress Note Patient Details:  [name] [name] 02/19/1961 [id number] Pt refused unna boots. Saying they wrap his legs differently at the wound center and he doesn't want them.  Patient ID: [name] [name], male   DOB: 04/24/1961, 64 y.o.   MRN: [id number]  [name] Cos 11/12/2024, 2:53 PM

## 2024-11-12 NOTE — Patient Outreach (Signed)
 Received notification from Tobias Moose BSW advising patient has been admitted to [facility] with dx: syncope. Complex Care Management paused.   Clayborne Ly RN BSN CCM [place]  [facility], [facility] Nurse Care Coordinator  Direct Dial: [phone] Website: [email or web address]

## 2024-11-12 NOTE — Progress Notes (Signed)
 Pt called out to nurse tech reporting severe chest pain. Per nurse tech, pt sitting on the side of the bed. Pt reports 10/10 chest pain. Vital signs obtained, BP is 212/111. Provider notified. EKG obtained. Rapid response nurse called to assess pt. Nitroglycerin  sublingual dose given 3x q 5 mins as ordered for chest pain. Chest X-ray ordered, transfer orders placed. Cardiology paged. Provider at bedside

## 2024-11-12 NOTE — Significant Event (Signed)
 Rapid Response Event Note   Reason for Call :  Chest pain   Initial Focused Assessment:  Patient alert and oriented, complaining of left chest pain rated 10/10, described as stabbing. Started approximately 30 minutes prior to rapid response. Patient was sitting on edge of bed when pain started. On arrival 2 doses of nitroglycerin  sublingual was administered. Third dose of nitroglycerin  administered per orders. Chest pain improved to 8/10. EKG completed prior to arrival- in chart. [place] MD at bedside.   NSR on monitor, rate 80-90s. S1 and S2 heard, regular rate and rhythm. Radial pulse 2+ bilaterally. BLE with lymphedema.  Lungs clear, no accessory muscle use.   Vitals: SpO2 96% on 2L nasal canula. HR 86, SBP 170-180s.   Interventions:  Transfer to progressive per [place] MD Cardiology paged by bedside RN to discuss care.  Daily blood pressure medication administered as ordered  Event Summary:   MD Notified: Prior to arrival by bedside RN Call Time: 0832 Arrival Time: 0835 End Time: 0900  Barnie Clover, RN

## 2024-11-12 NOTE — Consult Note (Signed)
 WOC Nurse Consult Note: Reason for Consult: current pt, dressings in place Pt has not allowed floor nurses to assess wounds under current bandages. Is seen outpatient by wound care team.  Followed in the [facility]; last seen 11/04/24. Non compliant with compression wraps. Hx.of lymphedema  Wound type: venous stasis L lateral Lower Leg L posterior circumferential lower leg R anterior lower leg R lateral lower leg   Pressure Injury POA: NA Measurement: see nursing flowsheet  Wound bed: see nursing flow seets Drainage (amount, consistency, odor) see nursing flow sheets Periwound: edema Dressing changes and compression:   Cleanse bilateral LEs with Vashe Soila [phone]), pat dry. Cut to fit silver hydrofiber Soila [phone]) and place on each wound, top with silicone foam Unna's boots bilaterally per orthopedic tech.  Orders updated  FU with [facility] as scheduled 11/14/24.    Re consult if needed, will not follow at this time. Thanks  [employer]bel [employer], RN,CWOCN, CNS, [employer] [phone]

## 2024-11-13 ENCOUNTER — Inpatient Hospital Stay (HOSPITAL_COMMUNITY)

## 2024-11-13 ENCOUNTER — Other Ambulatory Visit (HOSPITAL_COMMUNITY): Payer: Self-pay

## 2024-11-13 DIAGNOSIS — R55 Syncope and collapse: Secondary | ICD-10-CM | POA: Diagnosis not present

## 2024-11-13 MED ORDER — GABAPENTIN 100 MG PO CAPS
200.0000 mg | ORAL_CAPSULE | Freq: Three times a day (TID) | ORAL | 0 refills | Status: AC
  Filled 2024-11-13: qty 540, 90d supply, fill #0

## 2024-11-13 MED ORDER — HYDRALAZINE HCL 50 MG PO TABS: 100.0000 mg | ORAL_TABLET | Freq: Three times a day (TID) | ORAL | Status: DC

## 2024-11-13 MED ORDER — FLUTICASONE PROPIONATE 50 MCG/ACT NA SUSP
2.0000 | Freq: Every day | NASAL | 0 refills | Status: AC
  Filled 2024-11-13: qty 16, 30d supply, fill #0

## 2024-11-13 MED ORDER — FUROSEMIDE 40 MG PO TABS
40.0000 mg | ORAL_TABLET | Freq: Every morning | ORAL | 0 refills | Status: AC
  Filled 2024-11-13: qty 90, 90d supply, fill #0

## 2024-11-13 MED ORDER — RIVAROXABAN 20 MG PO TABS
20.0000 mg | ORAL_TABLET | Freq: Every day | ORAL | 0 refills | Status: AC
  Filled 2024-11-13: qty 90, 90d supply, fill #0

## 2024-11-13 MED ORDER — VALSARTAN 320 MG PO TABS
320.0000 mg | ORAL_TABLET | Freq: Every day | ORAL | 0 refills | Status: AC
  Filled 2024-11-13: qty 90, 90d supply, fill #0

## 2024-11-13 MED ORDER — DULOXETINE HCL 30 MG PO CPEP
30.0000 mg | ORAL_CAPSULE | Freq: Every day | ORAL | 0 refills | Status: AC
  Filled 2024-11-13: qty 90, 90d supply, fill #0

## 2024-11-13 MED ORDER — IRBESARTAN 300 MG PO TABS
300.0000 mg | ORAL_TABLET | Freq: Every day | ORAL | Status: DC
  Administered 2024-11-13: 300 mg via ORAL
  Filled 2024-11-13: qty 1

## 2024-11-13 MED ORDER — HYDROXYZINE HCL 25 MG PO TABS
25.0000 mg | ORAL_TABLET | Freq: Three times a day (TID) | ORAL | 2 refills | Status: AC | PRN
  Filled 2024-11-13: qty 30, 10d supply, fill #0

## 2024-11-13 MED ORDER — HYDRALAZINE HCL 100 MG PO TABS
100.0000 mg | ORAL_TABLET | Freq: Three times a day (TID) | ORAL | 0 refills | Status: DC
  Filled 2024-11-13: qty 270, 90d supply, fill #0

## 2024-11-13 MED ORDER — LORATADINE 10 MG PO TABS
10.0000 mg | ORAL_TABLET | Freq: Every day | ORAL | 0 refills | Status: AC
  Filled 2024-11-13: qty 90, 90d supply, fill #0

## 2024-11-13 NOTE — Progress Notes (Signed)
" °   11/13/24 1046  TOC Brief Assessment  Insurance and Status Reviewed  Patient has primary care physician Yes  Home environment has been reviewed Single family home  Prior level of function: Independent with ADL's  Prior/Current Home Services No current home services  Social Drivers of Health Review SDOH reviewed no interventions necessary  Readmission risk has been reviewed Yes  Transition of care needs no transition of care needs at this time    "

## 2024-11-13 NOTE — Discharge Summary (Signed)
 " Physician Discharge Summary  [name] [name] FMW:[id number] DOB: [birth year] DOA: 11/11/2024  PCP: [name], FNP  Admit date: 11/11/2024 Discharge date: 11/13/2024  Admitted From: Home Disposition: Home  Recommendations for Outpatient Follow-up:  Follow up with PCP in 1-2 weeks Follow-up with cardiology in 2-3 weeks Cardiology to mail outpatient cardiac monitor to evaluate for potential underlying  arrhythmia leading to his syncopal events/falls Hydralazine  increased to 100 mg p.o. 3 times daily and valsartan  increased to 320 mg p.o. daily for poorly controlled hypertension Follow-up with PCP/cardiology regarding further adjustment/titration of antihypertensives  Home Health: No Equipment/Devices: None (cardiology to mail cardiac monitor to patient's house)  Discharge Condition: Stable CODE STATUS: Full code Diet recommendation: Heart healthy diet  History of present illness:  [name] [name] is a 64 y.o. 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 Patient correlates that these events have been ongoing for the last few weeks ever since he has been unable to use his CPAP.  Patient states that he has not been able to use his nasal pillows due to stuffed up nose. Patient states that he will suddenly lose consciousness when he is walking.  He will wake up on the floor.  He denies any prodrome or warning symptoms.  He has not had any recent lightheadedness, dizziness, chest pain, palpitations.  He reports chronic baseline dyspnea related to COPD.  He has chronic swelling to lower extremities due to lymphedema which is not significantly  changed.    He has chronic pain and a history of opioid use disorder which has been managed by the methadone  clinic.  He says he has been on methadone  for about 1 year.  There was concern that the methadone  was contributing to his syncopal events and he has recently started on a slow taper from 140 mg daily and currently taking 130 mg daily.   He has been having left shoulder pain after one of the recent falls.   In the ED, BP 191/96, pulse 84, RR 20, temp 98.3 F, SpO2 94% on room air. WBC 9.4, hemoglobin 11.0, platelets 241, sodium 141, potassium 3.7, bicarb 25, BUN 8, creatinine 0.60, serum glucose 99, LFTs within normal limits, magnesium  1.9. CT head without contrast negative for acute intracranial abnormality.  Remote left posterior MCA territory infarct noted. CTA head showed mild intracranial atherosclerosis without a large vessel occlusion or significant proximal stenosis. Left shoulder x-ray showed an acute fracture of the distal left clavicle. EDP discussed with cardiology who recommended medical admission and they will see in consultation in the morning.  The hospitalist service was consulted for admission.  Hospital course:  Recurrent syncope with collapse Elevated D-dimer Patient reports frequent syncopal episodes without prodrome.  Has fallen multiple times due to these events.  EKG shows prolonged QTc 509 ms.  TTE with LVEF 55 to 60%, LV with normal function and no regional wall motion normalities, diastolic parameters normal, LA mildly dilated, no MR, no aortic stenosis, IVC normal in size.  D-dimer elevated 1.69.  Seen by cardiology and recommendation of outpatient cardiac monitor.  CT angiogram chest negative for pulmonary embolism.  Patient has been walking around the hospital room/unit during his hospitalization without  issue.  No arrhythmias noted on telemetry while inpatient.  Outpatient follow-up with cardiology.   Prolonged QTc: Resolved Repeat EKG shows resolution of QTc  prolongation.  Resuming home methadone  given repeat EKG showing normalization of QTc.   Chronic HFpEF, compensated Hypertension, poorly controlled Hypertensive urgency TTE with LVEF 55 to 60%, LV with normal function and no regional wall motion normalities, diastolic parameters normal, LA mildly dilated, no MR, no aortic stenosis, IVC normal in size.  Hydralazine  increased to 100 mg p.o. 3 times daily, valsartan  increased to 320 mg p.o. daily.  Continue furosemide  40 mg p.o. daily.  Outpatient follow-up with PCP/cardiology.   COPD, stable Tobacco cessation   History of PE/DVT: Continue Xarelto .   History of CVA: Continue Xarelto .  Does not appear to be on statin.   Acute fracture of distal left clavicle: Left third rib fracture Occurring after fall.  X-ray shows mild displacement, no dislocation.  Applied shoulder sling for comfort.     Substance use disorder/chronic pain syndrome: Follows with the methadone  clinic and has recently started on taper of methadone  from 140 mg daily, now takes 130 mg daily.  Outpatient follow-up with methadone  clinic   Chronic lymphedema and nonhealing wounds of both lower extremities: Chronic and appears stable.  Follows with the wound care center.  Seen by wound RN while inpatient   Anxiety/depression Cymbalta  30 mg p.o. daily   OSA: Continue CPAP nightly.   Tobacco use: Patient reports he has cut down from 3 PPD to 1 PPD.  He declines nicotine  patch.   Obesity, class I Body mass index is 34.87 kg/m.  Discharge Diagnoses:  Principal Problem:   Syncope and collapse Active Problems:   Essential hypertension   COPD (chronic obstructive pulmonary disease) (HCC)   OSA on CPAP   (HFpEF) heart failure with preserved ejection fraction (HCC)   Chronic pain syndrome   Tobacco use   Prolonged Q-T interval on ECG   History of CVA (cerebrovascular accident)   Traumatic closed fracture of distal clavicle with minimal displacement, left, initial  encounter    Discharge Instructions  Discharge Instructions     Call MD for:  difficulty breathing, headache or visual disturbances   Complete by: As directed    Call MD for:  extreme fatigue   Complete by: As directed    Call MD for:  persistant dizziness or light-headedness   Complete by: As directed    Call MD for:  persistant nausea and vomiting   Complete by: As directed    Call MD for:  severe uncontrolled pain   Complete by: As directed    Call MD for:  temperature >100.4   Complete by: As directed    Discharge wound care:   Complete by: As directed    Follow-up with wound care center for continued management   Increase activity slowly   Complete by: As directed       Allergies as of 11/13/2024       Reactions   Amlodipine  Swelling, Other (See Comments)   BLE peripheral edema         Medication List     TAKE these medications    albuterol  108 (90 Base) MCG/ACT inhaler Commonly known as: VENTOLIN  HFA Inhale 2 puffs into the lungs every 6 (six) hours as needed for wheezing or shortness of breath.   Centrum Silver Men 50+ Tabs Take 1 tablet by mouth daily with breakfast.   DULoxetine  30 MG capsule Commonly known as: CYMBALTA  Take  1 capsule (30 mg total) by mouth daily.   fluticasone  50 MCG/ACT nasal spray Commonly known as: FLONASE  Place 2 sprays into both nostrils daily. Start taking on: November 14, 2024   furosemide  40 MG tablet Commonly known as: Lasix  Take 1 tablet (40 mg total) by mouth in the morning.   gabapentin  100 MG capsule Commonly known as: NEURONTIN  Take 2 capsules (200 mg total) by mouth 3 (three) times daily.   hydrALAZINE  100 MG tablet Commonly known as: APRESOLINE  Take 1 tablet (100 mg total) by mouth every 8 (eight) hours. What changed:  medication strength how much to take when to take this   hydrOXYzine  25 MG tablet Commonly known as: ATARAX  Take 1 tablet (25 mg total) by mouth 3 (three) times daily as needed for  anxiety.   loratadine  10 MG tablet Commonly known as: CLARITIN  Take 1 tablet (10 mg total) by mouth daily.   methadone  10 MG/ML solution Commonly known as: DOLOPHINE  Take 130 mg by mouth daily.   rivaroxaban  20 MG Tabs tablet Commonly known as: XARELTO  Take 1 tablet (20 mg total) by mouth daily with supper.   sodium chloride  0.65 % Soln nasal spray Commonly known as: OCEAN Place 1 spray into both nostrils as needed for congestion.   valsartan  320 MG tablet Commonly known as: DIOVAN  Take 1 tablet (320 mg total) by mouth daily. What changed:  medication strength how much to take               Discharge Care Instructions  (From admission, onward)           Start     Ordered   11/13/24 0000  Discharge wound care:       Comments: Follow-up with wound care center for continued management   11/13/24 1014            Follow-up Information     [name], FNP. Schedule an appointment as soon as possible for a visit in 1 week(s).   Specialty: Family Medicine Contact information: [address] [place] [address] [phone]         [facility] A Dept of [employer]. Schedule an appointment as soon as possible for a visit in 2 week(s).   Specialty: Cardiology Contact information: [address] [place]  [address] [phone]               Allergies[1]  Consultations: Cardiology   Procedures/Studies: DG CHEST PORT 1 VIEW Result Date: 11/12/2024 CLINICAL DATA:  Chest pain EXAM: PORTABLE CHEST 1 VIEW COMPARISON:  07/01/2024 FINDINGS: Mildly progressive enlargement of the cardiac silhouette with increased prominence of the pulmonary vasculature and interstitial markings. No pleural fluid seen. Thoracic spine degenerative changes and cervical spine fixation hardware. IMPRESSION: Mildly progressive cardiomegaly with interval mild changes of CHF. Electronically Signed   By: Elspeth Bathe M.D.   On:  11/12/2024 15:57   CT Angio Chest Pulmonary Embolism (PE) W or WO Contrast Result Date: 11/12/2024 CLINICAL DATA:  Recurrent syncopal episodes and falls. Elevated D-dimer. EXAM: CT ANGIOGRAPHY CHEST WITH CONTRAST TECHNIQUE: Multidetector CT imaging of the chest was performed using the standard protocol during bolus administration of intravenous contrast. Multiplanar CT image reconstructions and MIPs were obtained to evaluate the vascular anatomy. RADIATION DOSE REDUCTION: This exam was performed according to the departmental dose-optimization program which includes automated exposure control, adjustment of the mA and/or kV according to patient size and/or use  of iterative reconstruction technique. CONTRAST:  75mL OMNIPAQUE  IOHEXOL  350 MG/ML SOLN COMPARISON:  04/19/2024 FINDINGS: Cardiovascular: The pulmonary arteries are adequately opacified. There is no evidence of pulmonary embolism. Central pulmonary arteries are of normal caliber. The thoracic aorta is normal in caliber. Atherosclerosis of the aortic arch and descending thoracic aorta. Top-normal heart size. No pericardial fluid identified. No visualized calcified coronary artery plaque. Mediastinum/Nodes: No enlarged mediastinal, hilar, or axillary lymph nodes. Thyroid  gland, trachea, and esophagus demonstrate no significant findings. Lungs/Pleura: Mild opacity in the right middle lobe is favored to represent atelectasis. There is no evidence of pulmonary edema, pneumothorax, nodule or pleural fluid. Upper Abdomen: Probable mild hepatic steatosis. Musculoskeletal: Mildly displaced and acute appearing fracture of the anterior left third rib. Review of the MIP images confirms the above findings. IMPRESSION: 1. No evidence of pulmonary embolism. 2. Mildly displaced and acute appearing fracture of the anterior left third rib. 3. Mild opacity in the right middle lobe is favored to represent atelectasis. 4. Probable mild hepatic steatosis. 5. Aortic  atherosclerosis. Electronically Signed   By: Marcey Moan M.D.   On: 11/12/2024 14:39   ECHOCARDIOGRAM COMPLETE Result Date: 11/12/2024    ECHOCARDIOGRAM REPORT   Patient Name:   MASAKI ROTHBAUER Heo Date of Exam: 11/12/2024 Medical Rec #:  [id number]         Height:       71.0 in Accession #:    [id number]        Weight:       250.0 lb Date of Birth:  [birth year]         BSA:          2.318 m Patient Age:    63 years          BP:           178/95 mmHg Patient Gender: M                 HR:           69 bpm. Exam Location:  Inpatient Procedure: 2D Echo, Cardiac Doppler, Color Doppler and Intracardiac            Opacification Agent (Both Spectral and Color Flow Doppler were            utilized during procedure). Indications:    Syncope  History:        Patient has prior history of Echocardiogram examinations, most                 recent 07/01/2024. COPD, Signs/Symptoms:Syncope; Risk                 Factors:Hypertension, Dyslipidemia and Sleep Apnea.  Sonographer:    Philomena Daring Referring Phys: [id number] VISHAL R PATEL IMPRESSIONS  1. Left ventricular ejection fraction, by estimation, is 55 to 60%. The left ventricle has normal function. The left ventricle has no regional wall motion abnormalities. Left ventricular diastolic parameters were normal.  2. Right ventricular systolic function was not well visualized. The right ventricular size is not well visualized.  3. Left atrial size was mildly dilated.  4. The mitral valve is normal in structure. No evidence of mitral valve regurgitation. No evidence of mitral stenosis.  5. The aortic valve is tricuspid. Aortic valve regurgitation is not visualized. Aortic valve sclerosis is present, with no evidence of aortic valve stenosis.  6. The inferior vena cava is normal in size with greater than 50% respiratory variability, suggesting right atrial pressure of 3 mmHg.  Comparison(s): Prior images reviewed side by side. Function assessment has improved with use of imaging enhancing  agent. FINDINGS  Left Ventricle: Left ventricular ejection fraction, by estimation, is 55 to 60%. The left ventricle has normal function. The left ventricle has no regional wall motion abnormalities. Definity  contrast agent was given IV to delineate the left ventricular  endocardial borders. The left ventricular internal cavity size was normal in size. There is no left ventricular hypertrophy. Left ventricular diastolic parameters were normal. Right Ventricle: The right ventricular size is not well visualized. No increase in right ventricular wall thickness. Right ventricular systolic function was not well visualized. Left Atrium: Left atrial size was mildly dilated. Right Atrium: Right atrial size was normal in size. Pericardium: There is no evidence of pericardial effusion. Mitral Valve: The mitral valve is normal in structure. No evidence of mitral valve regurgitation. No evidence of mitral valve stenosis. Tricuspid Valve: The tricuspid valve is normal in structure. Tricuspid valve regurgitation is trivial. No evidence of tricuspid stenosis. Aortic Valve: The aortic valve is tricuspid. Aortic valve regurgitation is not visualized. Aortic valve sclerosis is present, with no evidence of aortic valve stenosis. Aortic valve mean gradient measures 6.0 mmHg. Aortic valve peak gradient measures 11.3 mmHg. Aortic valve area, by VTI measures 2.59 cm. Pulmonic Valve: The pulmonic valve was normal in structure. Pulmonic valve regurgitation is not visualized. No evidence of pulmonic stenosis. Aorta: The aortic root and ascending aorta are structurally normal, with no evidence of dilitation. Venous: The inferior vena cava is normal in size with greater than 50% respiratory variability, suggesting right atrial pressure of 3 mmHg. IAS/Shunts: The interatrial septum was not well visualized.  LEFT VENTRICLE PLAX 2D LVIDd:         5.30 cm   Diastology LVIDs:         3.90 cm   LV e' medial:    8.70 cm/s LV PW:         1.10 cm   LV  E/e' medial:  12.2 LV IVS:        1.10 cm   LV e' lateral:   12.70 cm/s LVOT diam:     2.10 cm   LV E/e' lateral: 8.3 LV SV:         76 LV SV Index:   33 LVOT Area:     3.46 cm  IVC IVC diam: 1.70 cm  PULMONARY VEINS Diastolic Velocity: 66.00 cm/s S/D Velocity:       1.20 Systolic Velocity:  79.10 cm/s LEFT ATRIUM             Index        RIGHT ATRIUM           Index LA diam:        4.30 cm 1.85 cm/m   RA Area:     18.50 cm LA Vol (A2C):   85.9 ml 37.05 ml/m  RA Volume:   47.20 ml  20.36 ml/m LA Vol (A4C):   72.4 ml 31.23 ml/m LA Biplane Vol: 81.3 ml 35.07 ml/m  AORTIC VALVE AV Area (Vmax):    2.35 cm AV Area (Vmean):   2.24 cm AV Area (VTI):     2.59 cm AV Vmax:           168.00 cm/s AV Vmean:          114.000 cm/s AV VTI:            0.294 m AV Peak Grad:  11.3 mmHg AV Mean Grad:      6.0 mmHg LVOT Vmax:         114.00 cm/s LVOT Vmean:        73.800 cm/s LVOT VTI:          0.220 m LVOT/AV VTI ratio: 0.75  AORTA Ao Root diam: 3.00 cm Ao Asc diam:  3.60 cm MITRAL VALVE                TRICUSPID VALVE MV Area (PHT): 3.42 cm     TR Peak grad:   9.9 mmHg MV Decel Time: 222 msec     TR Vmax:        157.00 cm/s MV E velocity: 106.00 cm/s MV A velocity: 117.00 cm/s  SHUNTS MV E/A ratio:  0.91         Systemic VTI:  0.22 m                             Systemic Diam: 2.10 cm Stanly Leavens MD Electronically signed by Stanly Leavens MD Signature Date/Time: 11/12/2024/10:20:55 AM    Final    DG Shoulder Left Result Date: 11/11/2024 CLINICAL DATA:  Status post fall. EXAM: DG SHOULDER 2+V*L* COMPARISON:  None Available. FINDINGS: There is an acute, mildly displaced fracture deformity involving the dorsal aspect of the distal left clavicle, at the acromioclavicular joint. There is no evidence of dislocation. There is no evidence of arthropathy or other focal bone abnormality. Soft tissues are unremarkable. IMPRESSION: Acute fracture of the distal left clavicle. Electronically Signed   By: Suzen Dials M.D.   On: 11/11/2024 18:14   CT Angio Head W or Wo Contrast Result Date: 11/11/2024 EXAM: CTA Head without and with Intravenous Contrast CLINICAL HISTORY: Stroke, follow up. TECHNIQUE: Axial CTA images of the head without and with intravenous contrast. MIP reconstructed images were created and reviewed. Dose reduction technique was used including one or more of the following: automated exposure control, adjustment of mA and kV according to patient size, and/or iterative reconstruction. CONTRAST: Without and with; 75 mL (iohexol  (OMNIPAQUE ) 350 MG/ML injection 75 mL IOHEXOL  350 MG/ML SOLN). COMPARISON: MRA head 09/18/2015. FINDINGS: ANTERIOR CIRCULATION: The intracranial internal carotid arteries are patent with mild atherosclerotic calcification bilaterally not resulting in a significant stenosis. ACAs and MCAs are patent without evidence of a proximal branch occlusion or significant proximal stenosis. There is mild asymmetric attenuation of left MCA branch vessels in the setting of a chronic infarct. The right A1 segment is hypoplastic. POSTERIOR CIRCULATION: The intracranial vertebral arteries are patent with the right being strongly dominant and the left being diffusely hypoplastic. A small amount of calcified plaque in the right V4 segment does not result in significant stenosis. Patent PICA and SCA origins are visualized bilaterally. The basilar artery is widely patent. There are moderate sized right and small left posterior communicating arteries. Both PCAs are patent without evidence of a significant proximal stenosis. No aneurysm. The dural venous sinuses are patent. IMPRESSION: 1. Mild intracranial atherosclerosis without a large vessel occlusion or significant proximal stenosis. Electronically signed by: Dasie Hamburg MD 11/11/2024 05:57 PM EST RP Workstation: [id number]   CT HEAD WO CONTRAST Result Date: 11/11/2024 EXAM: CT HEAD WITHOUT 11/11/2024 03:07:54 PM TECHNIQUE: CT of the head was  performed without the administration of intravenous contrast. Automated exposure control, iterative reconstruction, and/or weight based adjustment of the mA/kV was utilized to reduce the radiation dose to as low  as reasonably achievable. COMPARISON: CT head 07/11/2023. CLINICAL HISTORY: Neuro deficit, acute, stroke suspected FINDINGS: BRAIN AND VENTRICLES: No acute intracranial hemorrhage. No mass effect or midline shift. No extra-axial fluid collection. No evidence of acute infarct. Remote left posterior MCA territory infarct. No hydrocephalus. ORBITS: No acute abnormality. SINUSES AND MASTOIDS: No acute abnormality. SOFT TISSUES AND SKULL: No acute skull fracture. No acute soft tissue abnormality. IMPRESSION: 1. No acute intracranial abnormality. 2. Remote left posterior MCA territory infarct. Electronically signed by: Gilmore Molt 11/11/2024 03:40 PM EST RP Workstation: [id number]     Subjective: Patient seen examined bedside, sitting in bedside chair.  RN present.  No complaints this morning.  Blood pressure better controlled.  Ready for discharge home.  Requesting Atarax  as well on discharge.  Discussed cardiology will mail cardiac monitor for evaluation of arrhythmia to his home.  Also discussed imaging findings of clavicle fracture and left rib fracture likely causing his symptoms of chest pain.  No other questions or concerns at this time.  Denies headache, no dizziness, no current chest pain, no palpitations, no shortness of breath, no abdominal pain, no fever/chills/night sweats, no nausea/vomiting/diarrhea, no focal weakness, no fatigue, no paresthesias.  No acute events overnight per nursing staff.  Discharge Exam: Vitals:   11/13/24 0800 11/13/24 1000  BP: (!) 160/98 (!) 157/75  Pulse: 73 77  Resp:    Temp: 97.9 F (36.6 C)   SpO2: 96% 97%   Vitals:   11/13/24 0600 11/13/24 0615 11/13/24 0800 11/13/24 1000  BP: (!) 170/93  (!) 160/98 (!) 157/75  Pulse: 63  73 77  Resp: 14      Temp:   97.9 F (36.6 C)   TempSrc:   Oral   SpO2: 97%  96% 97%  Weight:  121.7 kg    Height:        Physical Exam: GEN: NAD, alert and oriented x 3, obese, chronically on appearance, appears older than stated age HEENT: NCAT, PERRL, EOMI, sclera clear, MMM PULM: CTAB w/o wheezes/crackles, normal respiratory effort, on room air CV: RRR w/o M/G/R GI: abd soft, NTND, + BS MSK: + LE edema w/ wounds; currently with Kerlix/Ace wrap's in place NEURO: No focal neurodeficit PSYCH: Agitated mood Integumentary: Bilateral lower extremity wounds with Kerlix/Ace wrap's in place, right elbow abrasion as below                 The results of significant diagnostics from this hospitalization (including imaging, microbiology, ancillary and laboratory) are listed below for reference.     Microbiology: Recent Results (from the past 240 hours)  MRSA Next Gen by PCR, Nasal     Status: None   Collection Time: 11/12/24 11:11 AM   Specimen: Nasal Mucosa; Nasal Swab  Result Value Ref Range Status   MRSA by PCR Next Gen NOT DETECTED NOT DETECTED Final    Comment: (NOTE) The GeneXpert MRSA Assay (FDA approved for NASAL specimens only), is one component of a comprehensive MRSA colonization surveillance program. It is not intended to diagnose MRSA infection nor to guide or monitor treatment for MRSA infections. Test performance is not FDA approved in patients less than 53 years old. Performed at [facility], [address], [place], [place] [address]      Labs: BNP (last 3 results) Recent Labs    04/19/24 0832 07/01/24 1057 10/22/24 0900  BNP 46.2 28.7 77.7   Basic Metabolic Panel: Recent Labs  Lab 11/11/24 1114 11/11/24 1708 11/12/24 0317  NA  141  --  141  K 3.7  --  3.9  CL 107  --  107  CO2 25  --  25  GLUCOSE 99  --  80  BUN 8  --  7*  CREATININE 0.60*  --  0.63  CALCIUM  8.4*  --  8.5*  MG  --  1.9  --    Liver Function Tests: Recent Labs   Lab 11/11/24 1114  AST 15  ALT 10  ALKPHOS 100  BILITOT 0.4  PROT 6.7  ALBUMIN 3.4*   No results for input(s): LIPASE, AMYLASE in the last 168 hours. No results for input(s): AMMONIA in the last 168 hours. CBC: Recent Labs  Lab 11/11/24 1114 11/12/24 0317  WBC 9.4 8.3  NEUTROABS 6.6  --   HGB 11.0* 10.7*  HCT 33.8* 33.8*  MCV 92.1 93.1  PLT 241 233   Cardiac Enzymes: No results for input(s): CKTOTAL, CKMB, CKMBINDEX, TROPONINI in the last 168 hours. BNP: Invalid input(s): POCBNP CBG: Recent Labs  Lab 11/11/24 1429 11/12/24 0448  GLUCAP 95 75   D-Dimer Recent Labs    11/12/24 1002  DDIMER 1.69*   Hgb A1c No results for input(s): HGBA1C in the last 72 hours. Lipid Profile No results for input(s): CHOL, HDL, LDLCALC, TRIG, CHOLHDL, LDLDIRECT in the last 72 hours. Thyroid  function studies No results for input(s): TSH, T4TOTAL, T3FREE, THYROIDAB in the last 72 hours.  Invalid input(s): FREET3 Anemia work up No results for input(s): VITAMINB12, FOLATE, FERRITIN, TIBC, IRON, RETICCTPCT in the last 72 hours. Urinalysis    Component Value Date/Time   COLORURINE STRAW (A) 07/11/2023 0423   APPEARANCEUR CLEAR 07/11/2023 0423   LABSPEC 1.006 07/11/2023 0423   PHURINE 8.0 07/11/2023 0423   GLUCOSEU 50 (A) 07/11/2023 0423   HGBUR SMALL (A) 07/11/2023 0423   BILIRUBINUR NEGATIVE 07/11/2023 0423   BILIRUBINUR NEG 07/10/2013 1617   KETONESUR 5 (A) 07/11/2023 0423   PROTEINUR NEGATIVE 07/11/2023 0423   UROBILINOGEN 0.2 09/18/2015 1301   NITRITE NEGATIVE 07/11/2023 0423   LEUKOCYTESUR NEGATIVE 07/11/2023 0423   Sepsis Labs Recent Labs  Lab 11/11/24 1114 11/12/24 0317  WBC 9.4 8.3   Microbiology Recent Results (from the past 240 hours)  MRSA Next Gen by PCR, Nasal     Status: None   Collection Time: 11/12/24 11:11 AM   Specimen: Nasal Mucosa; Nasal Swab  Result Value Ref Range Status   MRSA by PCR Next  Gen NOT DETECTED NOT DETECTED Final    Comment: (NOTE) The GeneXpert MRSA Assay (FDA approved for NASAL specimens only), is one component of a comprehensive MRSA colonization surveillance program. It is not intended to diagnose MRSA infection nor to guide or monitor treatment for MRSA infections. Test performance is not FDA approved in patients less than 17 years old. Performed at [facility], [address], [place], [place] [address]      Time coordinating discharge: Over 30 minutes  SIGNED:   Camellia PARAS Kamauri Denardo, DO  [facility] 11/13/2024, 10:14 AM     [1]  Allergies Allergen Reactions   Amlodipine  Swelling and Other (See Comments)    BLE peripheral edema    "

## 2024-11-13 NOTE — Plan of Care (Signed)

## 2024-11-14 ENCOUNTER — Encounter (HOSPITAL_BASED_OUTPATIENT_CLINIC_OR_DEPARTMENT_OTHER): Admitting: General Surgery

## 2024-11-18 LAB — GLUCOSE, CAPILLARY: Glucose-Capillary: 105 mg/dL — ABNORMAL HIGH (ref 70–99)

## 2024-11-22 ENCOUNTER — Other Ambulatory Visit: Payer: Self-pay

## 2024-11-22 NOTE — Patient Outreach (Signed)
 Complex Care Management   Visit Note  11/22/2024  Name:  [name] [name] MRN: [id number] DOB: [birth year]  Situation: Referral received for Complex Care Management related to Essential Hypertension, lymphedema to both lower extremities, OSA w/CPAP, COPD, left foot drop, high risk for falls, alteration in sleep pattern with excessive daytime sleepiness, toothache, syncope. I obtained verbal consent from Patient.  Visit completed with Patient on the phone.  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   onset 02/14/14   Opioid abuse (HCC)    PE (pulmonary embolism)    DVT left leg   Positive colorectal cancer screening using Cologuard test 04/19/2019   Status post spinal surgery    Stroke [facility])    Substance abuse (HCC)    Substance use disorder 10/05/2022   Tobacco abuse     Assessment: Patient Reported Symptoms:  Cognitive Cognitive Status: Alert and oriented to person, place, and time, Normal speech and language skills Cognitive/Intellectual Conditions Management [RPT]: None reported or documented in medical history or problem list   Health Maintenance Behaviors: Annual physical exam Health Facilitated by: Rest  Neurological Neurological Review of Symptoms: Dizziness Neurological Management Strategies: Adequate rest, Medication therapy, Routine screening, Medical device Neurological Self-Management Outcome: 3 (uncertain)  HEENT HEENT Symptoms Reported:  Not assessed      Cardiovascular Cardiovascular Symptoms Reported: Palpitations, Lightheadness, Swelling in legs or feet Does patient have uncontrolled Hypertension?: Yes Is patient checking Blood Pressure at home?: Yes Cardiovascular Management Strategies: Adequate rest, Routine screening, Medication therapy Cardiovascular Self-Management Outcome: 3 (uncertain) Cardiovascular Comment: Discussed with patient his recent admission for observation. See Care Plan Goal for interventions provided.  Respiratory Respiratory Symptoms Reported: Shortness of breath, Productive cough Respiratory Management Strategies: CPAP, Adequate rest, Medication therapy, Routine screening Respiratory Self-Management Outcome: 3 (uncertain)  Endocrine Endocrine Symptoms Reported: No symptoms reported Is patient diabetic?: No    Gastrointestinal Gastrointestinal Symptoms Reported: Not assessed      Genitourinary Genitourinary Symptoms Reported: Not assessed    Integumentary Integumentary Symptoms Reported: Wound Skin Management Strategies: Dressing changes, Routine screening Skin Self-Management Outcome: 3 (uncertain)  Musculoskeletal Musculoskelatal Symptoms Reviewed: Difficulty walking, Limited mobility, Muscle pain, Unsteady gait, Weakness Musculoskeletal Management Strategies: Adequate rest, Medication therapy, Routine screening, Medical device Musculoskeletal Self-Management Outcome: 3 (uncertain) Falls in the past year?: Yes Number of falls in past year: 2 or more Was there an injury with Fall?: Yes Fall Risk Category Calculator: 3 Patient Fall Risk Level: High Fall Risk Patient at Risk for Falls Due to: Impaired balance/gait, Impaired mobility, Other (Comment) (dizziness, syncope) Fall risk Follow up: Follow up appointment, Falls evaluation completed, Education provided, Falls prevention discussed  Psychosocial Psychosocial Symptoms Reported: Alteration in sleep habits, Difficulty  concentrating Behavioral Management Strategies: Adequate rest Behavioral Health Self-Management Outcome: 3 (uncertain) Major Change/Loss/Stressor/Fears (CP): Medical condition, self Techniques to Cope with Loss/Stress/Change: Diversional activities Quality of Family Relationships:  helpful, involved, supportive Do you feel physically threatened by others?: No    11/22/2024    PHQ2-9 Depression Screening   Cashius Grandstaff interest or pleasure in doing things    Feeling down, depressed, or hopeless    PHQ-2 - Total Score    Trouble falling or staying asleep, or sleeping too much    Feeling tired or having Edythe Riches energy    Poor appetite or overeating     Feeling bad about yourself - or that you are a failure or have let yourself or your family down    Trouble concentrating on things, such as reading the newspaper or watching television    Moving or speaking so slowly that other people could have noticed.  Or the opposite - being so fidgety or restless that you have been moving around a lot more than usual    Thoughts that you would be better off dead, or hurting yourself in some way    PHQ2-9 Total Score    If you checked off any problems, how difficult have these problems made it for you to do your work, take care of things at home, or get along with other people    Depression Interventions/Treatment      There were no vitals filed for this visit. Pain Scale: Not given for pain  Medications Reviewed Today     Reviewed by Morgan Clayborne CROME, RN (Registered Nurse) on 11/22/24 at 1437  Med List Status: <None>   Medication Order Taking? Sig Documenting Provider Last Dose Status Informant  albuterol  (VENTOLIN  HFA) 108 (90 Base) MCG/ACT inhaler [id number] No Inhale 2 puffs into the lungs every 6 (six) hours as needed for wheezing or shortness of breath. [name], FNP Unknown Active Self  DULoxetine  (CYMBALTA ) 30 MG capsule [id number]  Take 1 capsule (30 mg total) by mouth daily. [place], Camellia PARAS, DO   Active   fluticasone  (FLONASE ) 50 MCG/ACT nasal spray [id number]  Place 2 sprays into both nostrils daily. [place], Camellia PARAS, DO  Active   furosemide  (LASIX ) 40 MG tablet [id number]  Take 1 tablet (40 mg total) by mouth in the morning. [place], Camellia PARAS, DO  Active   gabapentin  (NEURONTIN ) 100 MG capsule [id number]  Take 2 capsules (200 mg total) by mouth 3 (three) times daily. [place], Camellia PARAS, DO  Active   hydrALAZINE  (APRESOLINE ) 100 MG tablet [id number]  Take 1 tablet (100 mg total) by mouth every 8 (eight) hours. [place], Camellia PARAS, DO  Active   hydrOXYzine  (ATARAX ) 25 MG tablet [id number]  Take 1 tablet (25 mg total) by mouth 3 (three) times daily as needed for anxiety. [place], Camellia PARAS, DO  Active   loratadine  (CLARITIN ) 10 MG tablet [id number]  Take 1 tablet (10 mg total) by mouth daily. [place], Camellia PARAS, DO  Active   methadone  (DOLOPHINE ) 10 MG/ML solution [id number] No Take 130 mg by mouth daily. [provider] 11/11/2024 Morning Active Multiple Informants           Med Note (CRUTHIS, CHLOE C   Tue Nov 12, 2024  7:26 AM) Dose and last dose verified by [facility].   Multiple Vitamins-Minerals (CENTRUM SILVER MEN 50+) TABS [id number] No Take 1 tablet by mouth daily with breakfast. [provider] 11/10/2024 Active Self  rivaroxaban  (XARELTO ) 20 MG TABS tablet [id number]  Take 1 tablet (20 mg total) by mouth daily with supper. [place], Camellia PARAS, DO  Active   sodium chloride  (OCEAN) 0.65 % SOLN nasal spray  [id number] No Place 1 spray into both nostrils as needed for congestion. [provider] Past Week Active Self  valsartan  (DIOVAN ) 320 MG tablet [id number]  Take 1 tablet (320 mg total) by mouth daily. [place], Eric J, DO  Active   Med List Note Lorne Been, CPhT 11/12/24 9278): [facility])             Recommendation:   Specialty provider follow-up   12/26/2024 Status: Sch   Time: 8:00 AM Length: 25  Visit Type: HTN [2380] Copay: $0.00  Provider:  Vannie Reche RAMAN, NP Department: [facility]   Follow Up Plan:   Telephone follow up appointment date/time 11/25/2024 Status: Sch   Time: 11:00 AM Length: 30  Visit Type: PATIENT OUTREACH 30 [3016] Copay: $0.00  Provider: Maranda Lister D Department: [facility]    12/06/2024 Status: Sch   Time: 1:30 PM Length: 30  Visit Type: VBCI TELEPHONE CALL 30 [2502] Copay: $0.00  Provider: Morgan Clayborne CROME, RN Department: [facility]   Clayborne Morgan RN BSN CCM [place]  [facility], [facility] Nurse Care Coordinator  Direct Dial: [phone] Website: [email or web address]

## 2024-11-23 ENCOUNTER — Emergency Department (HOSPITAL_COMMUNITY)

## 2024-11-23 ENCOUNTER — Encounter (HOSPITAL_COMMUNITY): Payer: Self-pay

## 2024-11-23 ENCOUNTER — Emergency Department (HOSPITAL_COMMUNITY)
Admission: EM | Admit: 2024-11-23 | Discharge: 2024-11-23 | Disposition: A | Discharge status: Home / Self Care | Attending: Emergency Medicine | Admitting: Emergency Medicine

## 2024-11-23 DIAGNOSIS — R079 Chest pain, unspecified: Secondary | ICD-10-CM | POA: Diagnosis present

## 2024-11-23 DIAGNOSIS — R0789 Other chest pain: Secondary | ICD-10-CM | POA: Insufficient documentation

## 2024-11-23 DIAGNOSIS — Z7901 Long term (current) use of anticoagulants: Secondary | ICD-10-CM | POA: Insufficient documentation

## 2024-11-23 DIAGNOSIS — R600 Localized edema: Secondary | ICD-10-CM | POA: Insufficient documentation

## 2024-11-23 DIAGNOSIS — F17200 Nicotine dependence, unspecified, uncomplicated: Secondary | ICD-10-CM | POA: Diagnosis not present

## 2024-11-23 MED ORDER — LIDOCAINE 5 % EX PTCH: 1.0000 | MEDICATED_PATCH | CUTANEOUS | 0 refills | Status: DC

## 2024-11-23 MED ORDER — LIDOCAINE 5 % EX PTCH
1.0000 | MEDICATED_PATCH | CUTANEOUS | Status: DC
  Administered 2024-11-23: 1 via TRANSDERMAL
  Filled 2024-11-23: qty 1

## 2024-11-23 MED ORDER — OXYCODONE-ACETAMINOPHEN 5-325 MG PO TABS
1.0000 | ORAL_TABLET | Freq: Once | ORAL | Status: AC
  Administered 2024-11-23: 1 via ORAL
  Filled 2024-11-23: qty 1

## 2024-11-23 NOTE — ED Provider Notes (Signed)
 " [place] AT [facility] Provider Note   CSN: [id number] Arrival date & time: 11/23/24  1824     Patient presents with: Rib Injury   [name] [name] is a 64 y.o. male.  He was just discharged about 10 days ago after being admitted for frequent falls and syncope.  He has chronic pain and is on methadone .  He said today he was checking his vital signs with a home blood pressure monitor when he acutely felt a pop in the left side of his chest wall.  Since then he has had significant pain.  During his last admission they identified a left clavicle fracture and left third rib fracture.  He denies any other injuries or complaints.  No new falls.  {Add pertinent medical, surgical, social history, OB history to YEP:67052} The history is provided by the patient.  Chest Pain Pain location:  L lateral chest Pain quality: stabbing   Pain radiates to:  Does not radiate Pain severity:  Severe Onset quality:  Sudden Timing:  Constant Progression:  Unchanged Chronicity:  New Relieved by:  None tried Worsened by:  Movement Ineffective treatments:  None tried Associated symptoms: no abdominal pain, no cough, no diaphoresis, no fever, no nausea, no shortness of breath and no vomiting        Prior to Admission medications  Medication Sig Start Date End Date Taking? Authorizing Provider  albuterol  (VENTOLIN  HFA) 108 (90 Base) MCG/ACT inhaler Inhale 2 puffs into the lungs every 6 (six) hours as needed for wheezing or shortness of breath. 09/20/24   [name], FNP  DULoxetine  (CYMBALTA ) 30 MG capsule Take 1 capsule (30 mg total) by mouth daily. 11/13/24 02/11/25  [place], Eric J, DO  fluticasone  (FLONASE ) 50 MCG/ACT nasal spray Place 2 sprays into both nostrils daily. 11/14/24   [place], Camellia PARAS, DO  furosemide  (LASIX ) 40 MG tablet Take 1 tablet (40 mg total) by mouth in the morning. 11/13/24 02/11/25  [place], Camellia PARAS, DO  gabapentin  (NEURONTIN ) 100 MG capsule Take 2  capsules (200 mg total) by mouth 3 (three) times daily. 11/13/24 02/11/25  [place], Eric J, DO  hydrALAZINE  (APRESOLINE ) 100 MG tablet Take 1 tablet (100 mg total) by mouth every 8 (eight) hours. 11/13/24 02/11/25  [place], Eric J, DO  hydrOXYzine  (ATARAX ) 25 MG tablet Take 1 tablet (25 mg total) by mouth 3 (three) times daily as needed for anxiety. 11/13/24   [place], Camellia PARAS, DO  loratadine  (CLARITIN ) 10 MG tablet Take 1 tablet (10 mg total) by mouth daily. 11/13/24 02/11/25  [place], Eric J, DO  methadone  (DOLOPHINE ) 10 MG/ML solution Take 130 mg by mouth daily.    [provider]  Multiple Vitamins-Minerals (CENTRUM SILVER MEN 50+) TABS Take 1 tablet by mouth daily with breakfast.    [provider]  rivaroxaban  (XARELTO ) 20 MG TABS tablet Take 1 tablet (20 mg total) by mouth daily with supper. 11/13/24   [place], Camellia PARAS, DO  sodium chloride  (OCEAN) 0.65 % SOLN nasal spray Place 1 spray into both nostrils as needed for congestion.    [provider]  valsartan  (DIOVAN ) 320 MG tablet Take 1 tablet (320 mg total) by mouth daily. 11/13/24 02/11/25  [place], Eric J, DO    Allergies: Amlodipine     Review of Systems  Constitutional:  Negative for diaphoresis and fever.  Respiratory:  Negative for cough and shortness of breath.   Cardiovascular:  Positive for chest pain.  Gastrointestinal:  Negative  for abdominal pain, nausea and vomiting.    Updated Vital Signs BP (!) 155/78 (BP Location: Right Arm)   Pulse 85   Temp 99.1 F (37.3 C) (Oral)   Resp 20   SpO2 93%   Physical Exam Vitals and nursing note reviewed.  Constitutional:      Appearance: He is well-developed.  HENT:     Head: Normocephalic and atraumatic.  Eyes:     Conjunctiva/sclera: Conjunctivae normal.  Cardiovascular:     Rate and Rhythm: Normal rate and regular rhythm.     Heart sounds: No murmur heard. Pulmonary:     Effort: Pulmonary effort is normal. No respiratory distress.     Breath sounds: Normal breath  sounds.  Chest:    Abdominal:     Palpations: Abdomen is soft.     Tenderness: There is no abdominal tenderness.  Musculoskeletal:     Cervical back: Neck supple.     Right lower leg: Edema present.     Left lower leg: Edema present.  Skin:    General: Skin is warm and dry.  Neurological:     General: No focal deficit present.     Mental Status: He is alert.     GCS: GCS eye subscore is 4. GCS verbal subscore is 5. GCS motor subscore is 6.     (all labs ordered are listed, but only abnormal results are displayed) Labs Reviewed - No data to display  EKG: None  Radiology: DG Ribs Unilateral W/Chest Left Result Date: 11/23/2024 EXAM: 1 VIEW(S) XRAY OF THE LEFT RIBS AND CHEST 11/23/2024 07:24:17 PM COMPARISON: Comparison CT of the chest 11/12/2024. CLINICAL HISTORY: recent fall and dx with 3rd rib fracture. Pt heard \\T \ felt a popping sound when moving FINDINGS: BONES: Cervical spine surgical hardware. Known left 3rd rib fracture is not well seen. LUNGS AND PLEURA: No consolidation or pulmonary edema. No pleural effusion or pneumothorax. HEART AND MEDIASTINUM: No acute abnormality of the cardiac and mediastinal silhouettes. IMPRESSION: 1. No acute rib fracture identified; known left third rib fracture is not well visualized. 2. No acute process in the lungs. Electronically signed by: Greig Pique MD 11/23/2024 07:34 PM EST RP Workstation: [id number]    {Document cardiac monitor, telemetry assessment procedure when appropriate:32947} Procedures   Medications Ordered in the ED  oxyCODONE -acetaminophen  (PERCOCET/ROXICET) 5-325 MG per tablet 1 tablet (has no administration in time range)  lidocaine  (LIDODERM ) 5 % 1 patch (has no administration in time range)      {Click here for ABCD2, HEART and other calculators REFRESH Note before signing:1}                              Medical Decision Making Amount and/or Complexity of Data Reviewed Radiology: ordered.  Risk Prescription  drug management.   This patient complains of ***; this involves an extensive number of treatment Options and is a complaint that carries with it a high risk of complications and morbidity. The differential includes ***  I ordered, reviewed and interpreted labs, which included *** I ordered medication *** and reviewed PMP when indicated. I ordered imaging studies which included *** and I independently    visualized and interpreted imaging which showed *** Additional history obtained from *** Previous records obtained and reviewed *** I consulted *** and discussed lab and imaging findings and discussed disposition.  Cardiac monitoring reviewed, *** Social determinants considered, *** Critical Interventions: ***  After the interventions stated  above, I reevaluated the patient and found *** Admission and further testing considered, ***   {Document critical care time when appropriate  Document review of labs and clinical decision tools ie CHADS2VASC2, etc  Document your independent review of radiology images and any outside records  Document your discussion with family members, caretakers and with consultants  Document social determinants of health affecting pt's care  Document your decision making why or why not admission, treatments were needed:32947:::1}   Final diagnoses:  None    ED Discharge Orders     None        "

## 2024-11-23 NOTE — ED Triage Notes (Addendum)
 Pt comes in for ;eft rib pain. Pt was putting on his BP cuff he has at home anf heard and felt a popping sound.    Pt was recently admitted for a fall. Broke collar bone and 3rd rib. A&Ox4.

## 2024-11-25 ENCOUNTER — Other Ambulatory Visit: Payer: Self-pay | Admitting: Licensed Clinical Social Worker

## 2024-11-26 NOTE — Patient Instructions (Addendum)
 Visit Information  [name] [name] was given information about Medicaid Managed Care team care coordination services as a part of their [facility] Medicaid benefit.   If you would like to schedule transportation through your [facility] plan, please call the following number at least 2 days in advance of your appointment: [phone].   You can also use the MTM portal or MTM mobile app to manage your rides. Reimbursement for transportation is available through [facility]! For the portal, please go to [email or web address]  Call the [facility] Crisis Line at [phone], at any time, 24 hours a Salts, 7 days a week. If you are in danger or need immediate medical attention call 911.   Patient verbalizes understanding of instructions and care plan provided today and agrees to view in MyChart. Active MyChart status and patient understanding of how to access instructions and care plan via MyChart confirmed with patient.     Next Cardiology appointment:   12/26/2024 Status: Sch   Time: 8:00 AM Length: 25  Visit Type: HTN [2380] Copay: $0.00  Provider: Vannie Reche RAMAN, NP Department: [facility]    Clayborne Ly RN BSN CCM [facility]  [facility], [facility] Nurse Care Coordinator  Direct Dial: [phone] Website: [email or web address]    Following is a copy of your plan of care:   Goals Addressed             This Visit's Progress    VBCI RN Care Plan related to Essential Hypertension       Problems:  Chronic Disease Management support and education needs related to HTN  Goal: Over the next 90 days the Patient will continue to work with RN Care Manager and/or Social Worker to address care management and care coordination needs related to HTN as evidenced by adherence to care management team scheduled appointments     demonstrate Improved health management independence as evidenced by patient will report BP <130/80         Interventions:    Hypertension Interventions: Last practice recorded BP readings:  BP Readings from Last 3 Encounters:  11/23/24 (!) 145/82  11/13/24 (!) 157/75  10/22/24 (!) 193/92   Most recent eGFR/CrCl:  Lab Results  Component Value Date   EGFR 93 07/19/2024    No components found for: CRCL  Evaluation of current treatment plan related to hypertension self management and patient's adherence to plan as established by provider Provided education to patient re: stroke prevention, s/s of heart attack and stroke Reviewed medications with patient and discussed importance of compliance Advised patient, providing education and rationale, to monitor blood pressure daily and record, calling PCP for findings outside established parameters Provided education on prescribed diet low Sodium  Discussed complications of poorly controlled blood pressure such as heart disease, stroke, circulatory complications, vision complications, kidney impairment, sexual dysfunction Assessed social determinant of health barriers Discussed patient is working with Tobias Moose BSW for assistance with transportation needs Reviewed and discussed next scheduled telephone visit with Tobias Discussed plans with patient for ongoing nurse care management follow up and provided patient with direct contact information for nurse case management   Patient Self-Care Activities:  Attend all scheduled provider appointments Call pharmacy for medication refills 3-7 days in advance of running out of medications Call provider office for new concerns or questions  Take medications as prescribed   Work with the social worker to address care coordination needs and will continue to work with the clinical team to address health care and disease management  related needs check blood pressure daily learn about high blood pressure keep a blood pressure log take blood pressure log to all doctor appointments call doctor for signs and symptoms of high  blood pressure keep all doctor appointments take medications for blood pressure exactly as prescribed report new symptoms to your doctor  Recommendation:   Specialty provider follow-up   12/26/2024 Status: Sch    Time: 8:00 AM Length: 25  Visit Type: HTN [2380] Copay: $0.00  Provider: Vannie Reche RAMAN, NP Department: [facility]    Follow Up Plan:   Telephone follow up appointment date/time 11/25/2024 Status: Sch    Time: 11:00 AM Length: 30  Visit Type: PATIENT OUTREACH 30 [3016] Copay: $0.00  Provider: Maranda Lister D Department: [facility]     12/06/2024 Status: Sch    Time: 1:30 PM Length: 30  Visit Type: VBCI TELEPHONE CALL 30 [2502] Copay: $0.00  Provider: Morgan Clayborne CROME, RN Department: [facility]         VBCI RN Care Plan related to OSA with CPAP   Worsening    Problems:  Chronic Disease Management support and education needs related to Obstructive Sleep Apnea Transportation barriers  Goal: Over the next 90 days the Patient will continue to work with Medical Illustrator and/or Social Worker to address care management and care coordination needs related to Obstructive Sleep Apnea as evidenced by adherence to care management team scheduled appointments      Interventions:   Evaluation of current treatment plan related to OSA with CPAP, Transportation self-management and patient's adherence to plan as established by provider Determined patient experienced a [facility] [place] hospitalization for reoccurring episodes of Syncope Discussed patient was advised the cause was multifactorial due to having uncontrolled hypertension and uncontrolled OSA Determined patient has yet to hear from the [facility] sleep lab to schedule his CPAP titration Sent an in basket message to Dr. Wilbert Bihari, Cardiologist to make her aware of patient's recent hospitalization and need for his CPAP titration to be scheduled   Discussed plans with patient for ongoing care management  follow up and provided patient with direct contact information for care management team  Patient Self-Care Activities:  Attend all scheduled provider appointments Call pharmacy for medication refills 3-7 days in advance of running out of medications Call provider office for new concerns or questions  Take medications as prescribed    Recommendation:   Call the Sleep Lab at the phone number provided today to scheduled your CPAP Specialty provider follow-up   12/26/2024 Status: Sch    Time: 8:00 AM Length: 25  Visit Type: HTN [2380] Copay: $0.00  Provider: Vannie Reche RAMAN, NP Department: [facility]    Follow Up Plan:   Telephone follow up appointment date/time 11/25/2024 Status: Sch    Time: 11:00 AM Length: 30  Visit Type: PATIENT OUTREACH 30 [3016] Copay: $0.00  Provider: Maranda Lister D Department: [facility]     12/06/2024 Status: Sch    Time: 1:30 PM Length: 30  Visit Type: VBCI TELEPHONE CALL 30 [2502] Copay: $0.00  Provider: Morgan Clayborne CROME, RN Department: [facility]

## 2024-11-28 ENCOUNTER — Emergency Department (HOSPITAL_COMMUNITY)

## 2024-11-28 ENCOUNTER — Other Ambulatory Visit: Payer: Self-pay | Admitting: Licensed Clinical Social Worker

## 2024-11-28 ENCOUNTER — Other Ambulatory Visit: Payer: Self-pay

## 2024-11-28 ENCOUNTER — Telehealth: Payer: Self-pay | Admitting: *Deleted

## 2024-11-28 ENCOUNTER — Inpatient Hospital Stay (HOSPITAL_COMMUNITY)
Admission: EM | Admit: 2024-11-28 | Discharge: 2024-12-04 | DRG: 193 | Disposition: A | Discharge status: Home / Self Care | Attending: Internal Medicine | Admitting: Internal Medicine

## 2024-11-28 ENCOUNTER — Encounter (HOSPITAL_COMMUNITY): Payer: Self-pay | Admitting: Internal Medicine

## 2024-11-28 DIAGNOSIS — B356 Tinea cruris: Secondary | ICD-10-CM | POA: Diagnosis present

## 2024-11-28 DIAGNOSIS — Z8673 Personal history of transient ischemic attack (TIA), and cerebral infarction without residual deficits: Secondary | ICD-10-CM | POA: Diagnosis not present

## 2024-11-28 DIAGNOSIS — Z8249 Family history of ischemic heart disease and other diseases of the circulatory system: Secondary | ICD-10-CM | POA: Diagnosis not present

## 2024-11-28 DIAGNOSIS — F17210 Nicotine dependence, cigarettes, uncomplicated: Secondary | ICD-10-CM | POA: Diagnosis present

## 2024-11-28 DIAGNOSIS — E785 Hyperlipidemia, unspecified: Secondary | ICD-10-CM | POA: Diagnosis present

## 2024-11-28 DIAGNOSIS — Z6837 Body mass index (BMI) 37.0-37.9, adult: Secondary | ICD-10-CM | POA: Diagnosis not present

## 2024-11-28 DIAGNOSIS — J101 Influenza due to other identified influenza virus with other respiratory manifestations: Principal | ICD-10-CM | POA: Diagnosis present

## 2024-11-28 DIAGNOSIS — F419 Anxiety disorder, unspecified: Secondary | ICD-10-CM | POA: Diagnosis present

## 2024-11-28 DIAGNOSIS — E872 Acidosis, unspecified: Secondary | ICD-10-CM | POA: Diagnosis present

## 2024-11-28 DIAGNOSIS — D649 Anemia, unspecified: Secondary | ICD-10-CM | POA: Diagnosis present

## 2024-11-28 DIAGNOSIS — Z720 Tobacco use: Secondary | ICD-10-CM | POA: Diagnosis present

## 2024-11-28 DIAGNOSIS — G894 Chronic pain syndrome: Secondary | ICD-10-CM | POA: Diagnosis present

## 2024-11-28 DIAGNOSIS — E876 Hypokalemia: Secondary | ICD-10-CM | POA: Diagnosis present

## 2024-11-28 DIAGNOSIS — J9601 Acute respiratory failure with hypoxia: Secondary | ICD-10-CM | POA: Diagnosis present

## 2024-11-28 DIAGNOSIS — I110 Hypertensive heart disease with heart failure: Secondary | ICD-10-CM | POA: Diagnosis present

## 2024-11-28 DIAGNOSIS — Z86711 Personal history of pulmonary embolism: Secondary | ICD-10-CM | POA: Diagnosis not present

## 2024-11-28 DIAGNOSIS — F32A Depression, unspecified: Secondary | ICD-10-CM | POA: Diagnosis present

## 2024-11-28 DIAGNOSIS — Z79899 Other long term (current) drug therapy: Secondary | ICD-10-CM

## 2024-11-28 DIAGNOSIS — I5032 Chronic diastolic (congestive) heart failure: Secondary | ICD-10-CM | POA: Diagnosis present

## 2024-11-28 DIAGNOSIS — I10 Essential (primary) hypertension: Secondary | ICD-10-CM | POA: Diagnosis not present

## 2024-11-28 DIAGNOSIS — G4733 Obstructive sleep apnea (adult) (pediatric): Secondary | ICD-10-CM | POA: Diagnosis present

## 2024-11-28 DIAGNOSIS — J439 Emphysema, unspecified: Secondary | ICD-10-CM | POA: Diagnosis present

## 2024-11-28 DIAGNOSIS — J441 Chronic obstructive pulmonary disease with (acute) exacerbation: Secondary | ICD-10-CM | POA: Diagnosis present

## 2024-11-28 DIAGNOSIS — I5030 Unspecified diastolic (congestive) heart failure: Secondary | ICD-10-CM | POA: Diagnosis present

## 2024-11-28 DIAGNOSIS — M545 Low back pain, unspecified: Secondary | ICD-10-CM | POA: Diagnosis present

## 2024-11-28 DIAGNOSIS — E66812 Obesity, class 2: Secondary | ICD-10-CM | POA: Diagnosis present

## 2024-11-28 DIAGNOSIS — R918 Other nonspecific abnormal finding of lung field: Secondary | ICD-10-CM | POA: Diagnosis present

## 2024-11-28 DIAGNOSIS — Z818 Family history of other mental and behavioral disorders: Secondary | ICD-10-CM

## 2024-11-28 DIAGNOSIS — Z7901 Long term (current) use of anticoagulants: Secondary | ICD-10-CM

## 2024-11-28 DIAGNOSIS — Z86718 Personal history of other venous thrombosis and embolism: Secondary | ICD-10-CM | POA: Diagnosis not present

## 2024-11-28 DIAGNOSIS — J111 Influenza due to unidentified influenza virus with other respiratory manifestations: Secondary | ICD-10-CM

## 2024-11-28 DIAGNOSIS — R0902 Hypoxemia: Principal | ICD-10-CM

## 2024-11-28 LAB — RESP PANEL BY RT-PCR (RSV, FLU A&B, COVID)  RVPGX2
Influenza A by PCR: POSITIVE — AB
Influenza B by PCR: NEGATIVE
Resp Syncytial Virus by PCR: NEGATIVE
SARS Coronavirus 2 by RT PCR: NEGATIVE

## 2024-11-28 LAB — CBC
HCT: 34.5 % — ABNORMAL LOW (ref 39.0–52.0)
Hemoglobin: 11.1 g/dL — ABNORMAL LOW (ref 13.0–17.0)
MCH: 29.7 pg (ref 26.0–34.0)
MCHC: 32.2 g/dL (ref 30.0–36.0)
MCV: 92.2 fL (ref 80.0–100.0)
Platelets: 250 K/uL (ref 150–400)
RBC: 3.74 MIL/uL — ABNORMAL LOW (ref 4.22–5.81)
RDW: 15.9 % — ABNORMAL HIGH (ref 11.5–15.5)
WBC: 7.7 K/uL (ref 4.0–10.5)
nRBC: 0 % (ref 0.0–0.2)

## 2024-11-28 LAB — PROTIME-INR
INR: 1.1 (ref 0.8–1.2)
Prothrombin Time: 14.8 s (ref 11.4–15.2)

## 2024-11-28 LAB — BASIC METABOLIC PANEL WITH GFR
Anion gap: 10 (ref 5–15)
BUN: 12 mg/dL (ref 8–23)
CO2: 25 mmol/L (ref 22–32)
Calcium: 8.3 mg/dL — ABNORMAL LOW (ref 8.9–10.3)
Chloride: 101 mmol/L (ref 98–111)
Creatinine, Ser: 0.7 mg/dL (ref 0.61–1.24)
GFR, Estimated: >60 mL/min
Glucose, Bld: 90 mg/dL (ref 70–99)
Potassium: 3.4 mmol/L — ABNORMAL LOW (ref 3.5–5.1)
Sodium: 136 mmol/L (ref 135–145)

## 2024-11-28 LAB — TROPONIN T, HIGH SENSITIVITY
Troponin T High Sensitivity: 79 ng/L — ABNORMAL HIGH (ref 0–19)
Troponin T High Sensitivity: 71 ng/L — ABNORMAL HIGH (ref 0–19)

## 2024-11-28 LAB — MAGNESIUM: Magnesium: 2.5 mg/dL — ABNORMAL HIGH (ref 1.7–2.4)

## 2024-11-28 LAB — PROCALCITONIN: Procalcitonin: 0.18 ng/mL

## 2024-11-28 LAB — PHOSPHORUS: Phosphorus: 2.7 mg/dL (ref 2.5–4.6)

## 2024-11-28 LAB — LACTIC ACID, PLASMA: Lactic Acid, Venous: 0.8 mmol/L (ref 0.5–1.9)

## 2024-11-28 MED ORDER — POTASSIUM CHLORIDE CRYS ER 20 MEQ PO TBCR
40.0000 meq | EXTENDED_RELEASE_TABLET | Freq: Once | ORAL | Status: AC
  Administered 2024-11-28: 40 meq via ORAL
  Filled 2024-11-28: qty 2

## 2024-11-28 MED ORDER — LORATADINE 10 MG PO TABS
10.0000 mg | ORAL_TABLET | Freq: Every day | ORAL | Status: DC
  Administered 2024-11-29 – 2024-12-04 (×6): 10 mg via ORAL
  Filled 2024-11-28 (×6): qty 1

## 2024-11-28 MED ORDER — OSELTAMIVIR PHOSPHATE 75 MG PO CAPS
75.0000 mg | ORAL_CAPSULE | Freq: Two times a day (BID) | ORAL | Status: AC
  Administered 2024-11-28 – 2024-12-02 (×10): 75 mg via ORAL
  Filled 2024-11-28 (×11): qty 1

## 2024-11-28 MED ORDER — LACTATED RINGERS IV SOLN: INTRAVENOUS | Status: DC

## 2024-11-28 MED ORDER — ACETAMINOPHEN 325 MG PO TABS
650.0000 mg | ORAL_TABLET | Freq: Four times a day (QID) | ORAL | Status: DC | PRN
  Administered 2024-11-28 – 2024-12-02 (×5): 650 mg via ORAL
  Filled 2024-11-28 (×5): qty 2

## 2024-11-28 MED ORDER — GABAPENTIN 100 MG PO CAPS
200.0000 mg | ORAL_CAPSULE | Freq: Three times a day (TID) | ORAL | Status: DC
  Administered 2024-11-28 – 2024-12-04 (×18): 200 mg via ORAL
  Filled 2024-11-28 (×18): qty 2

## 2024-11-28 MED ORDER — ALBUTEROL SULFATE (2.5 MG/3ML) 0.083% IN NEBU: 2.5000 mg | INHALATION_SOLUTION | RESPIRATORY_TRACT | Status: DC | PRN

## 2024-11-28 MED ORDER — RIVAROXABAN 20 MG PO TABS
20.0000 mg | ORAL_TABLET | Freq: Every day | ORAL | Status: DC
  Administered 2024-11-28 – 2024-12-03 (×6): 20 mg via ORAL
  Filled 2024-11-28 (×6): qty 1

## 2024-11-28 MED ORDER — DOXYCYCLINE HYCLATE 100 MG PO TABS
100.0000 mg | ORAL_TABLET | Freq: Two times a day (BID) | ORAL | Status: DC
  Administered 2024-11-29 – 2024-12-03 (×10): 100 mg via ORAL
  Filled 2024-11-28 (×10): qty 1

## 2024-11-28 MED ORDER — SODIUM CHLORIDE 0.9 % IV SOLN
500.0000 mg | Freq: Once | INTRAVENOUS | Status: DC
  Filled 2024-11-28: qty 5

## 2024-11-28 MED ORDER — SODIUM CHLORIDE 0.9 % IV SOLN
2.0000 g | INTRAVENOUS | Status: AC
  Administered 2024-11-29 – 2024-12-02 (×4): 2 g via INTRAVENOUS
  Filled 2024-11-28 (×4): qty 20

## 2024-11-28 MED ORDER — METHADONE HCL 10 MG/ML PO CONC
130.0000 mg | Freq: Every day | ORAL | Status: DC
  Administered 2024-11-29 – 2024-12-04 (×5): 130 mg via ORAL
  Filled 2024-11-28 (×7): qty 15

## 2024-11-28 MED ORDER — AZITHROMYCIN 250 MG PO TABS
500.0000 mg | ORAL_TABLET | Freq: Once | ORAL | Status: AC
  Administered 2024-11-28: 500 mg via ORAL
  Filled 2024-11-28: qty 2

## 2024-11-28 MED ORDER — ACETAMINOPHEN 325 MG PO TABS
650.0000 mg | ORAL_TABLET | Freq: Once | ORAL | Status: AC
  Administered 2024-11-28: 650 mg via ORAL
  Filled 2024-11-28: qty 2

## 2024-11-28 MED ORDER — ALBUTEROL SULFATE (2.5 MG/3ML) 0.083% IN NEBU
2.5000 mg | INHALATION_SOLUTION | Freq: Four times a day (QID) | RESPIRATORY_TRACT | Status: DC
  Administered 2024-11-28 – 2024-11-30 (×6): 2.5 mg via RESPIRATORY_TRACT
  Filled 2024-11-28 (×6): qty 3

## 2024-11-28 MED ORDER — GUAIFENESIN ER 600 MG PO TB12
600.0000 mg | ORAL_TABLET | Freq: Two times a day (BID) | ORAL | Status: DC
  Administered 2024-11-28 – 2024-12-04 (×13): 600 mg via ORAL
  Filled 2024-11-28 (×13): qty 1

## 2024-11-28 MED ORDER — HYDROXYZINE HCL 25 MG PO TABS
25.0000 mg | ORAL_TABLET | Freq: Three times a day (TID) | ORAL | Status: DC | PRN
  Administered 2024-12-01: 25 mg via ORAL
  Filled 2024-11-28 (×3): qty 1

## 2024-11-28 MED ORDER — SODIUM CHLORIDE 0.9 % IV BOLUS
1000.0000 mL | Freq: Once | INTRAVENOUS | Status: AC
  Administered 2024-11-28: 1000 mL via INTRAVENOUS

## 2024-11-28 MED ORDER — ONDANSETRON HCL 4 MG PO TABS: 4.0000 mg | ORAL_TABLET | Freq: Four times a day (QID) | ORAL | Status: DC | PRN

## 2024-11-28 MED ORDER — ACETAMINOPHEN 650 MG RE SUPP: 650.0000 mg | Freq: Four times a day (QID) | RECTAL | Status: DC | PRN

## 2024-11-28 MED ORDER — DULOXETINE HCL 30 MG PO CPEP
30.0000 mg | ORAL_CAPSULE | Freq: Every day | ORAL | Status: DC
  Administered 2024-11-29 – 2024-12-04 (×6): 30 mg via ORAL
  Filled 2024-11-28 (×6): qty 1

## 2024-11-28 MED ORDER — METHYLPREDNISOLONE SODIUM SUCC 125 MG IJ SOLR
125.0000 mg | Freq: Once | INTRAMUSCULAR | Status: AC
  Administered 2024-11-28: 125 mg via INTRAVENOUS
  Filled 2024-11-28: qty 2

## 2024-11-28 MED ORDER — MAGNESIUM SULFATE 2 GM/50ML IV SOLN
2.0000 g | Freq: Once | INTRAVENOUS | Status: AC
  Administered 2024-11-28: 2 g via INTRAVENOUS
  Filled 2024-11-28: qty 50

## 2024-11-28 MED ORDER — ONDANSETRON HCL 4 MG/2ML IJ SOLN: 4.0000 mg | Freq: Four times a day (QID) | INTRAMUSCULAR | Status: DC | PRN

## 2024-11-28 MED ORDER — AZITHROMYCIN 250 MG PO TABS: 500.0000 mg | ORAL_TABLET | Freq: Every day | ORAL | Status: DC

## 2024-11-28 MED ORDER — SODIUM CHLORIDE 0.9 % IV SOLN
2.0000 g | Freq: Once | INTRAVENOUS | Status: AC
  Administered 2024-11-28: 2 g via INTRAVENOUS
  Filled 2024-11-28: qty 20

## 2024-11-28 MED ORDER — IRBESARTAN 300 MG PO TABS
300.0000 mg | ORAL_TABLET | Freq: Every day | ORAL | Status: DC
  Administered 2024-11-29 – 2024-12-04 (×6): 300 mg via ORAL
  Filled 2024-11-28 (×8): qty 1

## 2024-11-28 NOTE — ED Triage Notes (Signed)
 Pt came in for rib and chest pain after falling a couple days ago. Pt also had a low grade fever and cough in triage.

## 2024-11-28 NOTE — H&P (Signed)
 " History and Physical    Patient: [name] [name] FMW:[id number] DOB: 09/22/1961 DOA: 11/28/2024 DOS: the patient was seen and examined on 11/28/2024 PCP: [name], FNP  Patient coming from: Home  Chief Complaint:  Chief Complaint  Patient presents with   Rib Injury   Chest Pain   HPI: [name] [name] is a 64 y.o. male with medical history significant of seasonal allergies, anxiety, depression, asthma, COPD, history of DVT, history of PE, history of CVA, EF preserved heart failure, hyperlipidemia, hypertension, migraine headaches, left sciatic nerve neuropathy, history of opioid and substance abuse, tobacco abuse, status post spinal surgery, history of back pain who presented to the emergency department with complaints of pleuritic chest pain after falling a couple of days ago, but also endorsed fatigue, dyspnea, fever, sore throat, wheezing and yellowish sputum productive cough.  No palpitations, diaphoresis, PND, orthopnea or pitting edema of the lower extremities.  No abdominal pain, nausea, emesis, diarrhea, constipation, melena or hematochezia.  No flank pain, dysuria, frequency or hematuria.  No polyuria, polydipsia, polyphagia or blurred vision.   Lab work: CBC showed white count 7.7, hemoglobin 11.1 g/dL platelets 749.  Normal PT and INR.  Normal lactic acid.  Positive influenza A PCR.  First troponin level was 79 ng/L.  BMP showed potassium of 3.4 mmol/L and a calcium  of 8.3 mg/dL, the rest of the electrolytes, glucose and renal function were normal.  Imaging: 2 view chest radiograph show patchy bibasilar opacification worse on the right than the left likely due to early infection.  No acute rib fracture.   ED course: Initial vital signs were temperature 100.6 F, pulse 86, respiration 20, BP 172/92 mmHg and O2 sat 86% on room air.  The patient received acetaminophen  650 mg p.o. x 1, azithromycin  500 mg p.o. x 1, ceftriaxone  2 g IVPB x 1 and 1000 mL of normal saline bolus.  I  added KCl 40 mEq p.o. x 1 and magnesium  sulfate 2 g IVPB.  Review of Systems: As mentioned in the history of present illness. All other systems reviewed and are negative. Past Medical History:  Diagnosis Date   Allergy    Anxiety    Asthma    Cancer (HCC)    CHF (congestive heart failure) (HCC)    Constipation due to pain medication    COPD (chronic obstructive pulmonary disease) (HCC)    CVA (cerebral infarction) 09/18/2015   Deep venous thrombosis (DVT) of left peroneal vein (HCC) 09/12/2023   Depression    Emphysema of lung (HCC)    History of DVT (deep vein thrombosis) 07/01/2024   History of pulmonary embolism 07/19/2016   Hyperlipidemia    Hypertension    Migraine    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   onset 02/14/14   Opioid abuse (HCC)    PE (pulmonary embolism)    DVT left leg   Positive colorectal cancer screening using Cologuard test 04/19/2019   Status post spinal surgery    Stroke [facility])    Substance abuse (HCC)    Substance use disorder 10/05/2022   Tobacco abuse    Past Surgical History:  Procedure Laterality Date   SPINAL FIXATION SURGERY     cervical   SPINE SURGERY     Social History:  reports that he has been smoking cigarettes. He has a 22.5 pack-year smoking history. He has never used smokeless tobacco. He reports that he does not currently use drugs after having used the following drugs: Fentanyl , Heroin,  Hydrocodone , Hydromorphone , Morphine , and Oxycodone . He reports that he does not drink alcohol.  Allergies[1]  Family History  Problem Relation Age of Onset   Heart failure Mother    Hypertension Mother    Cancer Mother    Hypertension Father    Depression Sister     Prior to Admission medications  Medication Sig Start Date End Date Taking? Authorizing Provider  albuterol  (VENTOLIN  HFA) 108 (90 Base) MCG/ACT inhaler Inhale 2 puffs into the lungs every 6 (six) hours as needed for wheezing or shortness of breath. 09/20/24   [name], FNP  DULoxetine  (CYMBALTA ) 30 MG capsule Take 1 capsule (30 mg total) by mouth daily. 11/13/24 02/11/25  [place], Camellia PARAS, DO  fluticasone  (FLONASE ) 50 MCG/ACT nasal spray Place 2 sprays into both nostrils daily. 11/14/24   [place], Camellia PARAS, DO  furosemide  (LASIX ) 40 MG tablet Take 1 tablet (40 mg total) by mouth in the morning. 11/13/24 02/11/25  [place], Eric J, DO  gabapentin  (NEURONTIN ) 100 MG capsule Take 2 capsules (200 mg total) by mouth 3 (three) times daily. 11/13/24 02/11/25  [place], Camellia PARAS, DO  hydrALAZINE  (APRESOLINE ) 100 MG tablet Take 1 tablet (100 mg total) by mouth every 8 (eight) hours. 11/13/24 02/11/25  [place], Eric J, DO  hydrOXYzine  (ATARAX ) 25 MG tablet Take 1 tablet (25 mg total) by mouth 3 (three) times daily as needed for anxiety. 11/13/24   [place], Camellia PARAS, DO  lidocaine  (LIDODERM ) 5 % Place 1 patch onto the skin daily. Remove & Discard patch within 12 hours or as directed by MD 11/23/24   Towana Ozell BROCKS, MD  loratadine  (CLARITIN ) 10 MG tablet Take 1 tablet (10 mg total) by mouth daily. 11/13/24 02/11/25  [place], Camellia PARAS, DO  methadone  (DOLOPHINE ) 10 MG/ML solution Take 130 mg by mouth daily.    [provider]  Multiple Vitamins-Minerals (CENTRUM SILVER MEN 50+) TABS Take 1 tablet by mouth daily with breakfast.    [provider]  rivaroxaban  (XARELTO ) 20 MG TABS tablet Take 1 tablet (20 mg total) by mouth daily with supper. 11/13/24   [place], Camellia PARAS, DO  sodium chloride  (OCEAN) 0.65 % SOLN nasal spray Place 1 spray into both nostrils as needed for congestion.    [provider]  valsartan  (DIOVAN ) 320 MG tablet Take 1 tablet (320 mg total) by mouth daily. 11/13/24 02/11/25  [place], Eric J, DO    Physical Exam: Vitals:   11/28/24 0904 11/28/24 1023  BP: (!) 172/92 (!) 161/89  Pulse: 86 92  Resp: 20 19  Temp: (!) 100.6 F (38.1 C) 98.7 F (37.1 C)  TempSrc: Oral Oral  SpO2: (!) 86% 95%   Physical Exam Vitals and nursing note reviewed.  Constitutional:       General: He is awake. He is not in acute distress.    Appearance: He is ill-appearing.  HENT:     Head: Normocephalic.     Nose: No rhinorrhea.     Mouth/Throat:     Mouth: Mucous membranes are moist.  Eyes:     General: No scleral icterus.    Pupils: Pupils are equal, round, and reactive to light.  Neck:     Vascular: No JVD.  Cardiovascular:     Rate and Rhythm: Normal rate and regular rhythm.     Heart sounds: S1 normal and S2 normal.  Pulmonary:     Effort: No respiratory distress.     Breath sounds: Wheezing, rhonchi and rales present.  Abdominal:     General: Bowel sounds are normal. There is no distension.     Palpations: Abdomen is soft.     Tenderness: There is no abdominal tenderness. There is no right CVA tenderness or left CVA tenderness.  Musculoskeletal:     Cervical back: Neck supple.     Right lower leg: No edema.     Left lower leg: No edema.  Skin:    General: Skin is warm and dry.  Neurological:     General: No focal deficit present.     Mental Status: He is alert and oriented to person, place, and time.  Psychiatric:        Mood and Affect: Mood normal.        Behavior: Behavior normal. Behavior is cooperative.     Data Reviewed:  Results are pending, will review when available.  11/12/2024 echocardiogram report. IMPRESSIONS:   1. Left ventricular ejection fraction, by estimation, is 55 to 60%. The  left ventricle has normal function. The left ventricle has no regional  wall motion abnormalities. Left ventricular diastolic parameters were  normal.   2. Right ventricular systolic function was not well visualized. The right  ventricular size is not well visualized.   3. Left atrial size was mildly dilated.   4. The mitral valve is normal in structure. No evidence of mitral valve  regurgitation. No evidence of mitral stenosis.   5. The aortic valve is tricuspid. Aortic valve regurgitation is not  visualized. Aortic valve sclerosis is  present, with no evidence of aortic  valve stenosis.   6. The inferior vena cava is normal in size with greater than 50%  respiratory variability, suggesting right atrial pressure of 3 mmHg.   Comparison(s): Prior images reviewed side by side. Function assessment has  improved with use of imaging enhancing agent.   EKG: Vent. rate 86 BPM PR interval 138 ms QRS duration 83 ms QT/QTcB 379/454 ms P-R-T axes 54 -7 22 Sinus rhythm Multiform ventricular premature complexes  Assessment and Plan: Principal Problem:   Acute respiratory failure with hypoxia (HCC) Secondary to:   COPD with acute exacerbation (HCC) Due to:   Influenza A With superimposed:   Pulmonary infiltrates Admit to PCU/inpatient. Continue supplemental oxygen. Scheduled and as needed bronchodilators. Begin Tamiflu  75 mg p.o. twice daily. Methylprednisolone  125 mg IV P x 1. -Followed by prednisone  40 mg p.o. daily. Continue ceftriaxone  2 g IVPB daily. Continue azithromycin  500 mg p.o. daily. Check strep pneumoniae urinary antigen. Check sputum Gram stain, culture and sensitivity. Follow-up blood culture and sensitivity. Follow-up CBC and chemistry in the morning.  Active Problems:   Hypokalemia Replacing. Magnesium  was also supplemented. Follow-up potassium level tomorrow morning.    Essential hypertension On furosemide  40 mg p.o. daily. On hydralazine  100 mg p.o. every 8 hours. On valsartan  320 mg p.o. daily. Holding today given lactic acidosis.    Obesity, Class II, BMI 35-39.9 Current BMI 37.51 kg/m. Would benefit from lifestyle modifications. Follow-up closely with PCP and/or bariatric clinic.    Anxiety and depression Continue duloxetine  60 mg p.o. daily.    OSA on CPAP CPAP at bedtime.    (HFpEF) heart failure with preserved ejection fraction (HCC) Seems to be euvolemic. Will likely resume furosemide  and hydralazine  in AM.    Hyperlipidemia Follow-up with PCP. SABRA   Low back pain    Chronic pain syndrome Continue daily methadone  130 mg p.o. daily..    Tobacco use In remission.    Anemia Monitor  hematocrit and hemoglobin.    History of CVA (cerebrovascular accident) Supportive care.    History of DVT (deep vein thrombosis)    History of pulmonary embolism  Continue rivaroxaban  20 mg p.o. daily.      Advance Care Planning:   Code Status: Full Code   Consults:   Family Communication:   Severity of Illness: The appropriate patient status for this patient is INPATIENT. Inpatient status is judged to be reasonable and necessary in order to provide the required intensity of service to ensure the patient's safety. The patient's presenting symptoms, physical exam findings, and initial radiographic and laboratory data in the context of their chronic comorbidities is felt to place them at high risk for further clinical deterioration. Furthermore, it is not anticipated that the patient will be medically stable for discharge from the hospital within 2 midnights of admission.   * I certify that at the point of admission it is my clinical judgment that the patient will require inpatient hospital care spanning beyond 2 midnights from the point of admission due to high intensity of service, high risk for further deterioration and high frequency of surveillance required.*  Author: Alm Dorn Castor, MD 11/28/2024 11:20 AM  For on call review [email or web address].   This document was prepared using Dragon voice recognition software and may contain some unintended transcription errors.     [1]  Allergies Allergen Reactions   Amlodipine  Swelling and Other (See Comments)    BLE peripheral edema    "

## 2024-11-28 NOTE — Sepsis Progress Note (Signed)
 Elink following code sepsis

## 2024-11-28 NOTE — Telephone Encounter (Signed)
-----   Message from Nurse Clayborne CROME, RN sent at 11/27/2024 11:38 AM EST ----- Regarding: RE: Re: patient update Thanks Dr. Shlomo, please let me know if I can assist with care coordination.   Warmly, Clayborne Morgan OBIE BETHANN CCM [place]  [facility], [facility] Nurse Care Coordinator  Direct Dial: [phone] Website: [email or web address] ----- Message ----- From: Shlomo Wilbert SAUNDERS, MD Sent: 11/27/2024   9:40 AM EST To: Clayborne CROME Morgan, RN; Cv Div Sleep Studies Subject: RE: Re: patient update                         Brad I ordered a CPAP titration on this patient back in May can you please find out what is going on he has not heard anything from the sleep lab ----- Message ----- From: Morgan Clayborne CROME, RN Sent: 11/26/2024   4:29 PM EST To: Wilbert SAUNDERS Shlomo, MD Subject: Re: patient update                             Hello Dr. Shlomo,   I am a [place] VBCI nurse care manager working with this patient. He experienced a recent hospitalization for evaluation of recurrent syncope episodes. He was advised the cause is multifactorial related to uncontrolled Hypertension and uncontrolled OSA. He wanted me to let you know that he has yet to hear from the sleep lab to schedule his CPAP titration. Please let me know if you have any questions and or concerns and or if I can further assist.   Warmly, Clayborne Morgan RN BSN CCM [place]  [facility], [facility] Nurse Care Coordinator  Direct Dial: [phone] Website: [email or web address]

## 2024-11-28 NOTE — ED Provider Notes (Signed)
 " [place] EMERGENCY DEPARTMENT AT [facility] Provider Note   CSN: [id number] Arrival date & time: 11/28/24  9146     Patient presents with: Rib Injury and Chest Pain   [name] [name] is a 64 y.o. male.   Patient has a history of COPD, PE, stroke and hypertension.  Patient complains of chest discomfort and cough and weakness.  His roommate recently was hospitalized with influenza.  Patient recently had a fall and had a left rib series 5 days ago that was negative  The history is provided by the patient and medical records. No language interpreter was used.  Chest Pain Pain location:  L chest Pain quality: aching   Pain radiates to:  Does not radiate Pain severity:  Moderate Onset quality:  Sudden Timing:  Constant Progression:  Waxing and waning Chronicity:  New Context: not breathing and not eating   Relieved by:  Nothing Associated symptoms: cough   Associated symptoms: no abdominal pain, no back pain, no fatigue and no headache        Prior to Admission medications  Medication Sig Start Date End Date Taking? Authorizing Provider  albuterol  (VENTOLIN  HFA) 108 (90 Base) MCG/ACT inhaler Inhale 2 puffs into the lungs every 6 (six) hours as needed for wheezing or shortness of breath. 09/20/24   [name], FNP  DULoxetine  (CYMBALTA ) 30 MG capsule Take 1 capsule (30 mg total) by mouth daily. 11/13/24 02/11/25  [place], Camellia PARAS, DO  fluticasone  (FLONASE ) 50 MCG/ACT nasal spray Place 2 sprays into both nostrils daily. 11/14/24   [place], Camellia PARAS, DO  furosemide  (LASIX ) 40 MG tablet Take 1 tablet (40 mg total) by mouth in the morning. 11/13/24 02/11/25  [place], Eric J, DO  gabapentin  (NEURONTIN ) 100 MG capsule Take 2 capsules (200 mg total) by mouth 3 (three) times daily. 11/13/24 02/11/25  [place], Camellia PARAS, DO  hydrALAZINE  (APRESOLINE ) 100 MG tablet Take 1 tablet (100 mg total) by mouth every 8 (eight) hours. 11/13/24 02/11/25  [place], Eric J, DO  hydrOXYzine  (ATARAX ) 25 MG  tablet Take 1 tablet (25 mg total) by mouth 3 (three) times daily as needed for anxiety. 11/13/24   [place], Camellia PARAS, DO  lidocaine  (LIDODERM ) 5 % Place 1 patch onto the skin daily. Remove & Discard patch within 12 hours or as directed by MD 11/23/24   Towana Ozell BROCKS, MD  loratadine  (CLARITIN ) 10 MG tablet Take 1 tablet (10 mg total) by mouth daily. 11/13/24 02/11/25  [place], Camellia PARAS, DO  methadone  (DOLOPHINE ) 10 MG/ML solution Take 130 mg by mouth daily.    [provider]  Multiple Vitamins-Minerals (CENTRUM SILVER MEN 50+) TABS Take 1 tablet by mouth daily with breakfast.    [provider]  rivaroxaban  (XARELTO ) 20 MG TABS tablet Take 1 tablet (20 mg total) by mouth daily with supper. 11/13/24   [place], Camellia PARAS, DO  sodium chloride  (OCEAN) 0.65 % SOLN nasal spray Place 1 spray into both nostrils as needed for congestion.    [provider]  valsartan  (DIOVAN ) 320 MG tablet Take 1 tablet (320 mg total) by mouth daily. 11/13/24 02/11/25  [place], Eric J, DO    Allergies: Amlodipine     Review of Systems  Constitutional:  Negative for appetite change and fatigue.  HENT:  Negative for congestion, ear discharge and sinus pressure.   Eyes:  Negative for discharge.  Respiratory:  Positive for cough.   Cardiovascular:  Positive for chest pain.  Gastrointestinal:  Negative  for abdominal pain and diarrhea.  Genitourinary:  Negative for frequency and hematuria.  Musculoskeletal:  Negative for back pain.  Skin:  Negative for rash.  Neurological:  Negative for seizures and headaches.  Psychiatric/Behavioral:  Negative for hallucinations.     Updated Vital Signs BP (!) 161/89   Pulse 92   Temp 98.7 F (37.1 C) (Oral)   Resp 19   SpO2 95%   Physical Exam Vitals and nursing note reviewed.  Constitutional:      Appearance: He is well-developed.  HENT:     Head: Normocephalic.     Nose: Nose normal.  Eyes:     General: No scleral icterus.    Conjunctiva/sclera:  Conjunctivae normal.  Neck:     Thyroid : No thyromegaly.  Cardiovascular:     Rate and Rhythm: Normal rate and regular rhythm.     Heart sounds: No murmur heard.    No friction rub. No gallop.  Pulmonary:     Breath sounds: No stridor. Rales present. No wheezing.  Chest:     Chest wall: No tenderness.  Abdominal:     General: There is no distension.     Tenderness: There is no abdominal tenderness. There is no rebound.  Musculoskeletal:        General: Normal range of motion.     Cervical back: Neck supple.  Lymphadenopathy:     Cervical: No cervical adenopathy.  Skin:    Findings: No erythema or rash.  Neurological:     Mental Status: He is alert and oriented to person, place, and time.     Motor: No abnormal muscle tone.     Coordination: Coordination normal.  Psychiatric:        Behavior: Behavior normal.     (all labs ordered are listed, but only abnormal results are displayed) Labs Reviewed  RESP PANEL BY RT-PCR (RSV, FLU A&B, COVID)  RVPGX2 - Abnormal; Notable for the following components:      Result Value   Influenza A by PCR POSITIVE (*)    All other components within normal limits  BASIC METABOLIC PANEL WITH GFR - Abnormal; Notable for the following components:   Potassium 3.4 (*)    Calcium  8.3 (*)    All other components within normal limits  CBC - Abnormal; Notable for the following components:   RBC 3.74 (*)    Hemoglobin 11.1 (*)    HCT 34.5 (*)    RDW 15.9 (*)    All other components within normal limits  TROPONIN T, HIGH SENSITIVITY - Abnormal; Notable for the following components:   Troponin T High Sensitivity 79 (*)    All other components within normal limits  CULTURE, BLOOD (ROUTINE X 2)  CULTURE, BLOOD (ROUTINE X 2)  EXPECTORATED SPUTUM ASSESSMENT W GRAM STAIN, RFLX TO RESP C  PROTIME-INR  LACTIC ACID, PLASMA  URINALYSIS, W/ REFLEX TO CULTURE (INFECTION SUSPECTED)  MAGNESIUM   PHOSPHORUS  PROCALCITONIN  STREP PNEUMONIAE URINARY ANTIGEN   TROPONIN T, HIGH SENSITIVITY    EKG: None  Radiology: DG Chest 2 View Result Date: 11/28/2024 CLINICAL DATA:  Ribbon chest pain after fall a couple days ago. Also complains of low-grade fever and cough. EXAM: CHEST - 2 VIEW COMPARISON:  11/23/2024 FINDINGS: Lungs are adequately inflated with patchy bibasilar opacification right worse than left possibly due to early infection. No effusion. No pneumothorax. Cardiomediastinal silhouette is unremarkable. No acute rib fracture. Degenerative changes of the spine. IMPRESSION: 1. Patchy bibasilar opacification right worse than left  likely due to early infection. 2.  No acute rib fracture. Electronically Signed   By: Toribio Agreste M.D.   On: 11/28/2024 10:04     Procedures   Medications Ordered in the ED  lactated ringers  infusion ( Intravenous New Bag/Given 11/28/24 1009)  potassium chloride  SA (KLOR-CON  M) CR tablet 40 mEq (has no administration in time range)  magnesium  sulfate IVPB 2 g 50 mL (has no administration in time range)  cefTRIAXone  (ROCEPHIN ) 2 g in sodium chloride  0.9 % 100 mL IVPB (has no administration in time range)  azithromycin  (ZITHROMAX ) tablet 500 mg (has no administration in time range)  albuterol  (PROVENTIL ) (2.5 MG/3ML) 0.083% nebulizer solution 2.5 mg (has no administration in time range)  cefTRIAXone  (ROCEPHIN ) 2 g in sodium chloride  0.9 % 100 mL IVPB (0 g Intravenous Stopped 11/28/24 1038)  sodium chloride  0.9 % bolus 1,000 mL (0 mLs Intravenous Stopped 11/28/24 1127)  acetaminophen  (TYLENOL ) tablet 650 mg (650 mg Oral Given 11/28/24 1007)  azithromycin  (ZITHROMAX ) tablet 500 mg (500 mg Oral Given 11/28/24 1008)                                    Medical Decision Making Amount and/or Complexity of Data Reviewed Labs: ordered. Radiology: ordered.  Risk OTC drugs. Prescription drug management. Decision regarding hospitalization.   Patient with hypoxia and influenza with infiltrates on chest x-ray.  He will be  admitted to medicine     Final diagnoses:  Hypoxia  Influenza    ED Discharge Orders     None          Suzette Pac, MD 11/28/24 1134  "

## 2024-11-28 NOTE — Telephone Encounter (Signed)
 Reached out to patient and spoke with him about his sleep study appointment. Patient states he did not know he missed a call about his sleep test. Patient requested the sleep lab number be put in his mychart. I have done that for him.  Per the sleep lab, Called to schedule v/m full 10/24/24 TP.   Today 11/28/24  CURRENTLY- ED to Hospital admission.

## 2024-11-28 NOTE — Progress Notes (Signed)
" °   11/28/24 2224  BiPAP/CPAP/SIPAP  BiPAP/CPAP/SIPAP Pt Type Adult  Reason BIPAP/CPAP not in use Non-compliant (Pt refusing for the night. RT advised to call if he changes his mind.)  BiPAP/CPAP /SiPAP Vitals  Resp 15  MEWS Score/Color  MEWS Score 0  MEWS Score Color Green    "

## 2024-11-29 ENCOUNTER — Other Ambulatory Visit: Payer: Self-pay | Admitting: Surgery

## 2024-11-29 DIAGNOSIS — J101 Influenza due to other identified influenza virus with other respiratory manifestations: Secondary | ICD-10-CM | POA: Diagnosis not present

## 2024-11-29 DIAGNOSIS — J9601 Acute respiratory failure with hypoxia: Secondary | ICD-10-CM | POA: Diagnosis not present

## 2024-11-29 DIAGNOSIS — I10 Essential (primary) hypertension: Secondary | ICD-10-CM | POA: Diagnosis not present

## 2024-11-29 DIAGNOSIS — E876 Hypokalemia: Secondary | ICD-10-CM

## 2024-11-29 DIAGNOSIS — F419 Anxiety disorder, unspecified: Secondary | ICD-10-CM

## 2024-11-29 DIAGNOSIS — F32A Depression, unspecified: Secondary | ICD-10-CM

## 2024-11-29 DIAGNOSIS — J441 Chronic obstructive pulmonary disease with (acute) exacerbation: Secondary | ICD-10-CM | POA: Diagnosis not present

## 2024-11-29 DIAGNOSIS — M7989 Other specified soft tissue disorders: Secondary | ICD-10-CM

## 2024-11-29 LAB — EXPECTORATED SPUTUM ASSESSMENT W GRAM STAIN, RFLX TO RESP C

## 2024-11-29 LAB — CBC WITH DIFFERENTIAL/PLATELET
Abs Immature Granulocytes: 0.04 K/uL (ref 0.00–0.07)
Basophils Absolute: 0 K/uL (ref 0.0–0.1)
Basophils Relative: 0 %
Eosinophils Absolute: 0 K/uL (ref 0.0–0.5)
Eosinophils Relative: 0 %
HCT: 32.1 % — ABNORMAL LOW (ref 39.0–52.0)
Hemoglobin: 10.2 g/dL — ABNORMAL LOW (ref 13.0–17.0)
Immature Granulocytes: 1 %
Lymphocytes Relative: 14 %
Lymphs Abs: 1 K/uL (ref 0.7–4.0)
MCH: 29.1 pg (ref 26.0–34.0)
MCHC: 31.8 g/dL (ref 30.0–36.0)
MCV: 91.5 fL (ref 80.0–100.0)
Monocytes Absolute: 0.2 K/uL (ref 0.1–1.0)
Monocytes Relative: 3 %
Neutro Abs: 5.5 K/uL (ref 1.7–7.7)
Neutrophils Relative %: 82 %
Platelets: 201 K/uL (ref 150–400)
RBC: 3.51 MIL/uL — ABNORMAL LOW (ref 4.22–5.81)
RDW: 16.1 % — ABNORMAL HIGH (ref 11.5–15.5)
WBC: 6.6 K/uL (ref 4.0–10.5)
nRBC: 0 % (ref 0.0–0.2)

## 2024-11-29 LAB — COMPREHENSIVE METABOLIC PANEL WITH GFR
ALT: 21 U/L (ref 0–44)
AST: 43 U/L — ABNORMAL HIGH (ref 15–41)
Albumin: 2.7 g/dL — ABNORMAL LOW (ref 3.5–5.0)
Alkaline Phosphatase: 71 U/L (ref 38–126)
Anion gap: 10 (ref 5–15)
BUN: 13 mg/dL (ref 8–23)
CO2: 22 mmol/L (ref 22–32)
Calcium: 8.1 mg/dL — ABNORMAL LOW (ref 8.9–10.3)
Chloride: 107 mmol/L (ref 98–111)
Creatinine, Ser: 0.57 mg/dL — ABNORMAL LOW (ref 0.61–1.24)
GFR, Estimated: >60 mL/min
Glucose, Bld: 149 mg/dL — ABNORMAL HIGH (ref 70–99)
Potassium: 4.1 mmol/L (ref 3.5–5.1)
Sodium: 139 mmol/L (ref 135–145)
Total Bilirubin: <0.2 mg/dL (ref 0.0–1.2)
Total Protein: 5.9 g/dL — ABNORMAL LOW (ref 6.5–8.1)

## 2024-11-29 LAB — URINALYSIS, W/ REFLEX TO CULTURE (INFECTION SUSPECTED)
Bacteria, UA: NONE SEEN
Bilirubin Urine: NEGATIVE
Glucose, UA: NEGATIVE mg/dL
Hgb urine dipstick: NEGATIVE
Ketones, ur: 5 mg/dL — AB
Leukocytes,Ua: NEGATIVE
Nitrite: NEGATIVE
Protein, ur: 30 mg/dL — AB
Specific Gravity, Urine: 1.025 (ref 1.005–1.030)
pH: 5 (ref 5.0–8.0)

## 2024-11-29 LAB — PROCALCITONIN: Procalcitonin: 0.23 ng/mL

## 2024-11-29 NOTE — Consult Note (Signed)
" °  CLINICAL SUPPORT TEAM - WOUND OSTOMY AND CONTINENCE TEAM  CONSULTATION SERVICES   WOC Nurse-Inpatient Note    WOC Nurse Consult Note: Reason for Consult: Requested to assess bilateral wound legs. Wound type: vascular, follow by wound care clinic. First consultation at 01/06, when the WOC nurse recommended Unna boot bilateral legs. Patient refused to apply, because he used a different kind of bandage (Urgo K2), which [employer] does not carry on formulary.  Pressure Injury POA: NA Measurement: see nursing flowsheet. Wound bed: 100% red, superficial partial thickness. Drainage (amount, consistency, odor)  Periwound: swelling legs, erythema. Peeling skin. Dressing procedure/placement/frequency: Cleanse with Vashe [id number], nor rinse and allow to dry. Apply Xeroform to the wound bed, wrap with kerlix gauze and Coban 4 inches [id number] or ACE wrap. Starting at the base of toes, to the distal notch of knees, including the heels. Stretch 50% of the bandage starting on the ankle. Change daily.  WOC team will not plan to follow further. Please reconsult if further assistance is needed. Thank-you,  Lela Holm MSN, RN, CWCN, CNS.  (Phone [phone])     "

## 2024-11-29 NOTE — Progress Notes (Signed)
 [facility]  PROGRESS NOTE  [name] [name] FMW:[id number] DOB: 12/03/1960 DOA: 11/28/2024 PCP: [name], FNP   Brief HPI:    64 y.o. male with medical history significant of seasonal allergies, anxiety, depression, asthma, COPD, history of DVT, history of PE, history of CVA, EF preserved heart failure, hyperlipidemia, hypertension, migraine headaches, left sciatic nerve neuropathy, history of opioid and substance abuse, tobacco abuse, status post spinal surgery, history of back pain who presented to the emergency department with complaints of pleuritic chest pain after falling a couple of days ago, but also endorsed fatigue, dyspnea, fever, sore throat, wheezing and yellowish sputum productive cough.  No palpitations, diaphoresis, PND, orthopnea or pitting edema of the lower extremities.  No abdominal pain, nausea, emesis, diarrhea, constipation, melena or hematochezia.  No flank pain, dysuria, frequency or hematuria.  No polyuria, polydipsia, polyphagia or blurred vision.    Lab work: CBC showed white count 7.7, hemoglobin 11.1 g/dL platelets 749.  Normal PT and INR.  Normal lactic acid.  Positive influenza A PCR.  First troponin level was 79 ng/L.  BMP showed potassium of 3.4 mmol/L and a calcium  of 8.3 mg/dL, the rest of the electrolytes, glucose and renal function were normal.   Imaging: 2 view chest radiograph show patchy bibasilar opacification worse on the right than the left likely due to early infection.  No acute rib fracture.   ED course: Initial vital signs were temperature 100.6 F, pulse 86, respiration 20, BP 172/92 mmHg and O2 sat 86% on room air.  The patient received acetaminophen  650 mg p.o. x 1, azithromycin  500 mg p.o. x 1, ceftriaxone  2 g IVPB x 1 and 1000 mL of normal saline bolus.  I added KCl 40 mEq p.o. x 1 and magnesium  sulfate 2 g IVPB.      Assessment/Plan:    rincipal Problem:   Acute respiratory failure with hypoxia (HCC) Secondary to:   COPD with  acute exacerbation (HCC) Due to:   Influenza A With superimposed:   Pulmonary infiltrates -Improving -Continue Tamiflu  -Continue prednisone  -Continue Rocephin , doxycycline  -Continue bronchodilators    Active Problems:   Hypokalemia Replete    Essential hypertension -Antihypertensive medications on hold -Blood pressure has been stable     Obesity, Class II, BMI 35-39.9 Current BMI 37.51 kg/m. Would benefit from lifestyle modifications. Follow-up closely with PCP and/or bariatric clinic.    Anxiety and depression Continue duloxetine  60 mg p.o. daily.    OSA on CPAP CPAP at bedtime.     (HFpEF) heart failure with preserved ejection fraction (HCC) Seems to be euvolemic. Will likely resume furosemide  and hydralazine  in AM.     Hyperlipidemia Follow-up with PCP. SABRA   Low back pain   Chronic pain syndrome Continue daily methadone  130 mg p.o. daily..     Tobacco use In remission.     Anemia Monitor hematocrit and hemoglobin.     History of CVA (cerebrovascular accident) Supportive care.     History of DVT (deep vein thrombosis)    History of pulmonary embolism  Continue rivaroxaban  20 mg p.o. daily.         DVT prophylaxis: Xarelto   Medications     albuterol   2.5 mg Nebulization QID   doxycycline   100 mg Oral Q12H   DULoxetine   30 mg Oral Daily   gabapentin   200 mg Oral TID   guaiFENesin   600 mg Oral BID   irbesartan   300 mg Oral Daily   loratadine   10 mg Oral Daily  methadone   130 mg Oral Daily   oseltamivir   75 mg Oral BID   rivaroxaban   20 mg Oral Q supper     Data Reviewed:   CBG:  No results for input(s): GLUCAP in the last 168 hours.  SpO2: 97 % O2 Flow Rate (L/min): 2 L/min    Vitals:   11/28/24 2216 11/28/24 2224 11/29/24 0046 11/29/24 0510  BP:   126/81 (!) 124/92  Pulse:   64 65  Resp:  15 17 20   Temp:   97.8 F (36.6 C) 97.7 F (36.5 C)  TempSrc:   Axillary Oral  SpO2: 95%  97% 97%  Weight:      Height:           Data Reviewed:  Basic Metabolic Panel: Recent Labs  Lab 11/28/24 0924 11/28/24 1408 11/29/24 0427  NA 136  --  139  K 3.4*  --  4.1  CL 101  --  107  CO2 25  --  22  GLUCOSE 90  --  149*  BUN 12  --  13  CREATININE 0.70  --  0.57*  CALCIUM  8.3*  --  8.1*  MG  --  2.5*  --   PHOS  --  2.7  --     CBC: Recent Labs  Lab 11/28/24 0924 11/29/24 0427  WBC 7.7 6.6  NEUTROABS  --  5.5  HGB 11.1* 10.2*  HCT 34.5* 32.1*  MCV 92.2 91.5  PLT 250 201    LFT Recent Labs  Lab 11/29/24 0427  AST 43*  ALT 21  ALKPHOS 71  BILITOT <0.2  PROT 5.9*  ALBUMIN 2.7*     Antibiotics: Anti-infectives (From admission, onward)    Start     Dose/Rate Route Frequency Ordered Stop   11/29/24 1000  cefTRIAXone  (ROCEPHIN ) 2 g in sodium chloride  0.9 % 100 mL IVPB        2 g 200 mL/hr over 30 Minutes Intravenous Every 24 hours 11/28/24 1125 12/03/24 0959   11/29/24 1000  azithromycin  (ZITHROMAX ) tablet 500 mg  Status:  Discontinued        500 mg Oral Daily 11/28/24 1125 11/28/24 1738   11/29/24 1000  doxycycline  (VIBRA -TABS) tablet 100 mg        100 mg Oral Every 12 hours 11/28/24 1741     11/28/24 1245  oseltamivir  (TAMIFLU ) capsule 75 mg        75 mg Oral 2 times daily 11/28/24 1231 12/03/24 0959   11/28/24 1000  azithromycin  (ZITHROMAX ) tablet 500 mg        500 mg Oral  Once 11/28/24 0957 11/28/24 1008   11/28/24 0945  cefTRIAXone  (ROCEPHIN ) 2 g in sodium chloride  0.9 % 100 mL IVPB        2 g 200 mL/hr over 30 Minutes Intravenous Once 11/28/24 0934 11/28/24 1038   11/28/24 0945  azithromycin  (ZITHROMAX ) 500 mg in sodium chloride  0.9 % 250 mL IVPB  Status:  Discontinued        500 mg 250 mL/hr over 60 Minutes Intravenous  Once 11/28/24 0934 11/28/24 0957        CONSULTS   Code Status: Full code  Family Communication: No family at bedside     Subjective   Breathing has improved   Objective    Physical Examination:   General-appears in no acute  distress Heart-S1-S2, regular, no murmur auscultated Lungs-clear to auscultation bilaterally, no wheezing or crackles auscultated Abdomen-soft, nontender, no organomegaly Extremities-no edema  in the lower extremities Neuro-alert, oriented x3, no focal deficit noted          [name] [name]   [facility] If 7PM-7AM, please contact night-coverage at [email or web address], Office  [phone]   11/29/2024, 8:30 AM  LOS: 1 [place]

## 2024-11-30 DIAGNOSIS — J441 Chronic obstructive pulmonary disease with (acute) exacerbation: Secondary | ICD-10-CM

## 2024-11-30 DIAGNOSIS — E876 Hypokalemia: Secondary | ICD-10-CM | POA: Diagnosis not present

## 2024-11-30 DIAGNOSIS — J101 Influenza due to other identified influenza virus with other respiratory manifestations: Secondary | ICD-10-CM

## 2024-11-30 DIAGNOSIS — J9601 Acute respiratory failure with hypoxia: Secondary | ICD-10-CM | POA: Diagnosis not present

## 2024-11-30 DIAGNOSIS — F419 Anxiety disorder, unspecified: Secondary | ICD-10-CM | POA: Diagnosis not present

## 2024-11-30 MED ORDER — LIDOCAINE 5 % EX PTCH
1.0000 | MEDICATED_PATCH | Freq: Every day | CUTANEOUS | Status: DC
  Administered 2024-11-30 – 2024-12-04 (×4): 1 via TRANSDERMAL
  Filled 2024-11-30 (×4): qty 1

## 2024-11-30 MED ORDER — METHYLPREDNISOLONE SODIUM SUCC 40 MG IJ SOLR
40.0000 mg | Freq: Two times a day (BID) | INTRAMUSCULAR | Status: DC
  Administered 2024-11-30 – 2024-12-02 (×5): 40 mg via INTRAVENOUS
  Filled 2024-11-30 (×6): qty 1

## 2024-11-30 MED ORDER — IPRATROPIUM-ALBUTEROL 0.5-2.5 (3) MG/3ML IN SOLN
3.0000 mL | Freq: Four times a day (QID) | RESPIRATORY_TRACT | Status: DC
  Administered 2024-11-30 – 2024-12-03 (×13): 3 mL via RESPIRATORY_TRACT
  Filled 2024-11-30 (×13): qty 3

## 2024-11-30 NOTE — Progress Notes (Signed)
 Patient A/OX4. Noncompliant with some safety measures; refuses bed alarm. Floor mat and gripper socks in place. Ambulates frequently to bathroom. Patient agrees to use call bell for assistance. Safety education provided.

## 2024-11-30 NOTE — Progress Notes (Signed)
 [facility]  PROGRESS NOTE  Daylin Gruszka Mele FMW:[id number] DOB: [birth year] DOA: 11/28/2024 PCP: [name], FNP   Brief HPI:    64 y.o. male with medical history significant of seasonal allergies, anxiety, depression, asthma, COPD, history of DVT, history of PE, history of CVA, EF preserved heart failure, hyperlipidemia, hypertension, migraine headaches, left sciatic nerve neuropathy, history of opioid and substance abuse, tobacco abuse, status post spinal surgery, history of back pain who presented to the emergency department with complaints of pleuritic chest pain after falling a couple of days ago, but also endorsed fatigue, dyspnea, fever, sore throat, wheezing and yellowish sputum productive cough.  No palpitations, diaphoresis, PND, orthopnea or pitting edema of the lower extremities.  No abdominal pain, nausea, emesis, diarrhea, constipation, melena or hematochezia.  No flank pain, dysuria, frequency or hematuria.  No polyuria, polydipsia, polyphagia or blurred vision.    Lab work: CBC showed white count 7.7, hemoglobin 11.1 g/dL platelets 749.  Normal PT and INR.  Normal lactic acid.  Positive influenza A PCR.  First troponin level was 79 ng/L.  BMP showed potassium of 3.4 mmol/L and a calcium  of 8.3 mg/dL, the rest of the electrolytes, glucose and renal function were normal.   Imaging: 2 view chest radiograph show patchy bibasilar opacification worse on the right than the left likely due to early infection.  No acute rib fracture.   ED course: Initial vital signs were temperature 100.6 F, pulse 86, respiration 20, BP 172/92 mmHg and O2 sat 86% on room air.  The patient received acetaminophen  650 mg p.o. x 1, azithromycin  500 mg p.o. x 1, ceftriaxone  2 g IVPB x 1 and 1000 mL of normal saline bolus.  I added KCl 40 mEq p.o. x 1 and magnesium  sulfate 2 g IVPB.      Assessment/Plan:    rincipal Problem:   Acute respiratory failure with hypoxia (HCC) Secondary to:   COPD with  acute exacerbation (HCC) Due to:   Influenza A With superimposed:   Pulmonary infiltrates -Improving -Continue Tamiflu  -Continue Rocephin , doxycycline  -Continue DuoNeb nebulizers every 6 hours - Mucinex  600 mg p.o. twice daily - Start Solu-Medrol  40 mg IV every 12 hours    Active Problems:   Hypokalemia Replete    Essential hypertension -Antihypertensive medications on hold -Blood pressure has been stable     Obesity, Class II, BMI 35-39.9 Current BMI 37.51 kg/m. Would benefit from lifestyle modifications. Follow-up closely with PCP and/or bariatric clinic.    Anxiety and depression Continue duloxetine  60 mg p.o. daily.    OSA on CPAP CPAP at bedtime.     (HFpEF) heart failure with preserved ejection fraction (HCC) Seems to be euvolemic. Will likely resume furosemide  and hydralazine  in AM.     Hyperlipidemia Follow-up with PCP. SABRA   Low back pain   Chronic pain syndrome Continue daily methadone  130 mg p.o. daily..     Tobacco use In remission.     Anemia Monitor hematocrit and hemoglobin.     History of CVA (cerebrovascular accident) Supportive care.     History of DVT (deep vein thrombosis)    History of pulmonary embolism  Continue rivaroxaban  20 mg p.o. daily.         DVT prophylaxis: Xarelto   Medications     albuterol   2.5 mg Nebulization QID   doxycycline   100 mg Oral Q12H   DULoxetine   30 mg Oral Daily   gabapentin   200 mg Oral TID   guaiFENesin   600 mg  Oral BID   irbesartan   300 mg Oral Daily   loratadine   10 mg Oral Daily   methadone   130 mg Oral Daily   oseltamivir   75 mg Oral BID   rivaroxaban   20 mg Oral Q supper     Data Reviewed:   CBG:  No results for input(s): GLUCAP in the last 168 hours.  SpO2: 95 % O2 Flow Rate (L/min): 3 L/min    Vitals:   11/29/24 2310 11/29/24 2314 11/30/24 0456 11/30/24 0757  BP:   124/82   Pulse:   71   Resp:  20 12 16   Temp:   98.1 F (36.7 C)   TempSrc:   Oral   SpO2: 97%  95%    Weight:      Height:          Data Reviewed:  Basic Metabolic Panel: Recent Labs  Lab 11/28/24 0924 11/28/24 1408 11/29/24 0427  NA 136  --  139  K 3.4*  --  4.1  CL 101  --  107  CO2 25  --  22  GLUCOSE 90  --  149*  BUN 12  --  13  CREATININE 0.70  --  0.57*  CALCIUM  8.3*  --  8.1*  MG  --  2.5*  --   PHOS  --  2.7  --     CBC: Recent Labs  Lab 11/28/24 0924 11/29/24 0427  WBC 7.7 6.6  NEUTROABS  --  5.5  HGB 11.1* 10.2*  HCT 34.5* 32.1*  MCV 92.2 91.5  PLT 250 201    LFT Recent Labs  Lab 11/29/24 0427  AST 43*  ALT 21  ALKPHOS 71  BILITOT <0.2  PROT 5.9*  ALBUMIN 2.7*     Antibiotics: Anti-infectives (From admission, onward)    Start     Dose/Rate Route Frequency Ordered Stop   11/29/24 1000  cefTRIAXone  (ROCEPHIN ) 2 g in sodium chloride  0.9 % 100 mL IVPB        2 g 200 mL/hr over 30 Minutes Intravenous Every 24 hours 11/28/24 1125 12/03/24 0959   11/29/24 1000  azithromycin  (ZITHROMAX ) tablet 500 mg  Status:  Discontinued        500 mg Oral Daily 11/28/24 1125 11/28/24 1738   11/29/24 1000  doxycycline  (VIBRA -TABS) tablet 100 mg        100 mg Oral Every 12 hours 11/28/24 1741     11/28/24 1245  oseltamivir  (TAMIFLU ) capsule 75 mg        75 mg Oral 2 times daily 11/28/24 1231 12/03/24 0959   11/28/24 1000  azithromycin  (ZITHROMAX ) tablet 500 mg        500 mg Oral  Once 11/28/24 0957 11/28/24 1008   11/28/24 0945  cefTRIAXone  (ROCEPHIN ) 2 g in sodium chloride  0.9 % 100 mL IVPB        2 g 200 mL/hr over 30 Minutes Intravenous Once 11/28/24 0934 11/28/24 1038   11/28/24 0945  azithromycin  (ZITHROMAX ) 500 mg in sodium chloride  0.9 % 250 mL IVPB  Status:  Discontinued        500 mg 250 mL/hr over 60 Minutes Intravenous  Once 11/28/24 9065 11/28/24 0957        CONSULTS   Code Status: Full code  Family Communication: No family at bedside     Subjective   Patient seen and examined, coughing up phlegm.  Still requiring 3 L/min of  oxygen   Objective    Physical Examination:  General-appears in no acute distress Heart-S1-S2, regular, no murmur auscultated Lungs-bilateral wheezing auscultated Abdomen-soft, nontender, no organomegaly Extremities-no edema in the lower extremities Neuro-alert, oriented x3, no focal deficit noted         Kristle Wesch S Eugune Sine   [facility] If 7PM-7AM, please contact night-coverage at [email or web address], Office  [phone]   11/30/2024, 8:19 AM  LOS: 2 days

## 2024-11-30 NOTE — Progress Notes (Signed)
 PHARMACY - PHYSICIAN COMMUNICATION CRITICAL VALUE ALERT - BLOOD CULTURE IDENTIFICATION (BCID)  [name] [name] is an 64 y.o. male who presented to [facility] on 11/28/2024 with a chief complaint of CAP/Influenza  Assessment:  1/4 BCx bottles growing GPR (suspect contamination)  Name of physician (or Provider) Contacted: Lama  Current antibiotics: Rocephin , Doxycycline   Changes to prescribed antibiotics recommended:  Patient is on recommended antibiotics - No changes needed  No results found for this or any previous visit.  [name] A 11/30/2024  12:57 PM

## 2024-12-01 DIAGNOSIS — J9601 Acute respiratory failure with hypoxia: Secondary | ICD-10-CM | POA: Diagnosis not present

## 2024-12-01 DIAGNOSIS — F419 Anxiety disorder, unspecified: Secondary | ICD-10-CM | POA: Diagnosis not present

## 2024-12-01 DIAGNOSIS — J101 Influenza due to other identified influenza virus with other respiratory manifestations: Secondary | ICD-10-CM | POA: Diagnosis not present

## 2024-12-01 DIAGNOSIS — E876 Hypokalemia: Secondary | ICD-10-CM | POA: Diagnosis not present

## 2024-12-01 DIAGNOSIS — F32A Depression, unspecified: Secondary | ICD-10-CM | POA: Diagnosis not present

## 2024-12-01 DIAGNOSIS — J441 Chronic obstructive pulmonary disease with (acute) exacerbation: Secondary | ICD-10-CM | POA: Diagnosis not present

## 2024-12-01 MED ORDER — FUROSEMIDE 40 MG PO TABS
40.0000 mg | ORAL_TABLET | Freq: Every day | ORAL | Status: DC
  Administered 2024-12-01 – 2024-12-04 (×4): 40 mg via ORAL
  Filled 2024-12-01 (×4): qty 1

## 2024-12-01 MED ORDER — NYSTATIN 100000 UNIT/GM EX POWD
Freq: Three times a day (TID) | CUTANEOUS | Status: DC
  Filled 2024-12-01: qty 15

## 2024-12-01 NOTE — Progress Notes (Addendum)
 [facility]  PROGRESS NOTE  [name] [name] FMW:[id number] DOB: [birth year] DOA: 11/28/2024 PCP: [name], FNP   Brief HPI:    64 y.o. male with medical history significant of seasonal allergies, anxiety, depression, asthma, COPD, history of DVT, history of PE, history of CVA, EF preserved heart failure, hyperlipidemia, hypertension, migraine headaches, left sciatic nerve neuropathy, history of opioid and substance abuse, tobacco abuse, status post spinal surgery, history of back pain who presented to the emergency department with complaints of pleuritic chest pain after falling a couple of days ago, but also endorsed fatigue, dyspnea, fever, sore throat, wheezing and yellowish sputum productive cough.  No palpitations, diaphoresis, PND, orthopnea or pitting edema of the lower extremities.  No abdominal pain, nausea, emesis, diarrhea, constipation, melena or hematochezia.  No flank pain, dysuria, frequency or hematuria.  No polyuria, polydipsia, polyphagia or blurred vision.    Lab work: CBC showed white count 7.7, hemoglobin 11.1 g/dL platelets 749.  Normal PT and INR.  Normal lactic acid.  Positive influenza A PCR.  First troponin level was 79 ng/L.  BMP showed potassium of 3.4 mmol/L and a calcium  of 8.3 mg/dL, the rest of the electrolytes, glucose and renal function were normal.   Imaging: 2 view chest radiograph show patchy bibasilar opacification worse on the right than the left likely due to early infection.  No acute rib fracture.   ED course: Initial vital signs were temperature 100.6 F, pulse 86, respiration 20, BP 172/92 mmHg and O2 sat 86% on room air.  The patient received acetaminophen  650 mg p.o. x 1, azithromycin  500 mg p.o. x 1, ceftriaxone  2 g IVPB x 1 and 1000 mL of normal saline bolus.  I added KCl 40 mEq p.o. x 1 and magnesium  sulfate 2 g IVPB.      Assessment/Plan:       Acute respiratory failure with hypoxia (HCC) Secondary to:   COPD with acute  exacerbation (HCC) Due to:   Influenza A With superimposed:   Pulmonary infiltrates -Improving -Continue Tamiflu  -Continue Rocephin , doxycycline  -Continue DuoNeb nebulizers every 6 hours - Mucinex  600 mg p.o. twice daily - Started on  Solu-Medrol  40 mg IV every 12 hours    Active Problems:   Hypokalemia Replete    Essential hypertension -Antihypertensive medications on hold -Blood pressure has been stable     Obesity, Class II, BMI 35-39.9 Current BMI 37.51 kg/m. Would benefit from lifestyle modifications. Follow-up closely with PCP and/or bariatric clinic.    Anxiety and depression Continue duloxetine  60 mg p.o. daily.    OSA on CPAP CPAP at bedtime.     (HFpEF) heart failure with preserved ejection fraction (HCC) Will  resume furosemide  40 mg daily  Hypertension -Blood pressure is stable -Continue to hold hydralazine      Hyperlipidemia Follow-up with PCP. SABRA   Low back pain   Chronic pain syndrome Continue daily methadone  130 mg p.o. daily..     Tobacco use In remission.     Anemia Monitor hematocrit and hemoglobin.     History of CVA (cerebrovascular accident) Supportive care.     History of DVT (deep vein thrombosis)    History of pulmonary embolism  Continue rivaroxaban  20 mg p.o. daily.   Tinea cruris - Start nystatin  topical powder 3 times a [place] for 3 days     DVT prophylaxis: Xarelto   Medications     doxycycline   100 mg Oral Q12H   DULoxetine   30 mg Oral Daily   gabapentin   200 mg Oral TID   guaiFENesin   600 mg Oral BID   ipratropium-albuterol   3 mL Nebulization Q6H   irbesartan   300 mg Oral Daily   lidocaine   1 patch Transdermal Daily   loratadine   10 mg Oral Daily   methadone   130 mg Oral Daily   methylPREDNISolone  (SOLU-MEDROL ) injection  40 mg Intravenous BID   oseltamivir   75 mg Oral BID   rivaroxaban   20 mg Oral Q supper     Data Reviewed:   CBG:  No results for input(s): GLUCAP in the last 168 hours.  SpO2: 90  % O2 Flow Rate (L/min): 2 L/min    Vitals:   11/30/24 1203 11/30/24 1921 12/01/24 0204 12/01/24 0458  BP: 124/71 131/83  (!) 157/91  Pulse: 70 79  69  Resp: 16 18  17   Temp: 97.8 F (36.6 C) 97.9 F (36.6 C)  98 F (36.7 C)  TempSrc: Oral     SpO2: 98% 91% 92% 90%  Weight:      Height:          Data Reviewed:  Basic Metabolic Panel: Recent Labs  Lab 11/28/24 0924 11/28/24 1408 11/29/24 0427  NA 136  --  139  K 3.4*  --  4.1  CL 101  --  107  CO2 25  --  22  GLUCOSE 90  --  149*  BUN 12  --  13  CREATININE 0.70  --  0.57*  CALCIUM  8.3*  --  8.1*  MG  --  2.5*  --   PHOS  --  2.7  --     CBC: Recent Labs  Lab 11/28/24 0924 11/29/24 0427  WBC 7.7 6.6  NEUTROABS  --  5.5  HGB 11.1* 10.2*  HCT 34.5* 32.1*  MCV 92.2 91.5  PLT 250 201    LFT Recent Labs  Lab 11/29/24 0427  AST 43*  ALT 21  ALKPHOS 71  BILITOT <0.2  PROT 5.9*  ALBUMIN 2.7*     Antibiotics: Anti-infectives (From admission, onward)    Start     Dose/Rate Route Frequency Ordered Stop   11/29/24 1000  cefTRIAXone  (ROCEPHIN ) 2 g in sodium chloride  0.9 % 100 mL IVPB        2 g 200 mL/hr over 30 Minutes Intravenous Every 24 hours 11/28/24 1125 12/03/24 0959   11/29/24 1000  azithromycin  (ZITHROMAX ) tablet 500 mg  Status:  Discontinued        500 mg Oral Daily 11/28/24 1125 11/28/24 1738   11/29/24 1000  doxycycline  (VIBRA -TABS) tablet 100 mg        100 mg Oral Every 12 hours 11/28/24 1741     11/28/24 1245  oseltamivir  (TAMIFLU ) capsule 75 mg        75 mg Oral 2 times daily 11/28/24 1231 12/03/24 0959   11/28/24 1000  azithromycin  (ZITHROMAX ) tablet 500 mg        500 mg Oral  Once 11/28/24 0957 11/28/24 1008   11/28/24 0945  cefTRIAXone  (ROCEPHIN ) 2 g in sodium chloride  0.9 % 100 mL IVPB        2 g 200 mL/hr over 30 Minutes Intravenous Once 11/28/24 0934 11/28/24 1038   11/28/24 0945  azithromycin  (ZITHROMAX ) 500 mg in sodium chloride  0.9 % 250 mL IVPB  Status:  Discontinued         500 mg 250 mL/hr over 60 Minutes Intravenous  Once 11/28/24 0934 11/28/24 0957  CONSULTS   Code Status: Full code  Family Communication: No family at bedside     Subjective   Breathing has improved.  Still coughing up yellow phlegm.   Objective    Physical Examination:  General-appears in no acute distress Heart-S1-S2, regular, no murmur auscultated Lungs-clear to auscultation bilaterally, no wheezing or crackles auscultated Abdomen-soft, nontender, no organomegaly Extremities-no edema in the lower extremities Neuro-alert, oriented x3, no focal deficit noted        Koray Soter S Aariana Shankland   [facility] If 7PM-7AM, please contact night-coverage at [email or web address], Office  [phone]   12/01/2024, 9:25 AM  LOS: 3 days

## 2024-12-01 NOTE — TOC Progression Note (Signed)
 Transition of Care [facility]) - Progression Note    Patient Details  Name: [name] [name] MRN: [id number] Date of Birth: 1961/01/14  [facility]) CM/SW Contact  Sonda Manuella Quill, RN Phone Number: 12/01/2024, 4:19 PM  Clinical Narrative:    Attempted to contact pt on room phone; no answer; LVM on his cell [phone]); awaiting return call; unable to complete IP CM assessment.                     Expected Discharge Plan and Services                                               Social Drivers of Health (SDOH) Interventions SDOH Screenings   Food Insecurity: No Food Insecurity (11/28/2024)  Housing: Low Risk (11/28/2024)  Transportation Needs: No Transportation Needs (11/28/2024)  Recent Concern: Transportation Needs - Unmet Transportation Needs (10/09/2024)  Utilities: Not At Risk (11/28/2024)  Depression (PHQ2-9): High Risk (07/22/2024)  Financial Resource Strain: High Risk (01/10/2024)  Physical Activity: Unknown (01/10/2024)  Social Connections: Socially Isolated (11/11/2024)  Stress: Stress Concern Present (01/10/2024)  Tobacco Use: High Risk (11/28/2024)    Readmission Risk Interventions    11/13/2024   10:46 AM 07/02/2024    3:32 PM  Readmission Risk Prevention Plan  Transportation Screening Complete Complete  PCP or Specialist Appt within 5-7 Days  Complete  Home Care Screening  Complete  Medication Review (RN CM)  Complete  HRI or Home Care Consult Complete   Social Work Consult for Recovery Care Planning/Counseling Complete   Palliative Care Screening Not Applicable   Medication Review Oceanographer) Complete

## 2024-12-01 NOTE — Progress Notes (Signed)
" °   12/01/24 2208  BiPAP/CPAP/SIPAP  Reason BIPAP/CPAP not in use Non-compliant    "

## 2024-12-02 DIAGNOSIS — E66812 Obesity, class 2: Secondary | ICD-10-CM

## 2024-12-02 DIAGNOSIS — I10 Essential (primary) hypertension: Secondary | ICD-10-CM | POA: Diagnosis not present

## 2024-12-02 DIAGNOSIS — Z86718 Personal history of other venous thrombosis and embolism: Secondary | ICD-10-CM | POA: Diagnosis not present

## 2024-12-02 DIAGNOSIS — G4733 Obstructive sleep apnea (adult) (pediatric): Secondary | ICD-10-CM

## 2024-12-02 DIAGNOSIS — J441 Chronic obstructive pulmonary disease with (acute) exacerbation: Secondary | ICD-10-CM | POA: Diagnosis not present

## 2024-12-02 DIAGNOSIS — Z720 Tobacco use: Secondary | ICD-10-CM

## 2024-12-02 DIAGNOSIS — Z86711 Personal history of pulmonary embolism: Secondary | ICD-10-CM

## 2024-12-02 DIAGNOSIS — E876 Hypokalemia: Secondary | ICD-10-CM | POA: Diagnosis not present

## 2024-12-02 DIAGNOSIS — G894 Chronic pain syndrome: Secondary | ICD-10-CM

## 2024-12-02 DIAGNOSIS — J9601 Acute respiratory failure with hypoxia: Secondary | ICD-10-CM | POA: Diagnosis not present

## 2024-12-02 DIAGNOSIS — R918 Other nonspecific abnormal finding of lung field: Secondary | ICD-10-CM

## 2024-12-02 DIAGNOSIS — E785 Hyperlipidemia, unspecified: Secondary | ICD-10-CM

## 2024-12-02 DIAGNOSIS — I5032 Chronic diastolic (congestive) heart failure: Secondary | ICD-10-CM | POA: Diagnosis not present

## 2024-12-02 DIAGNOSIS — Z8673 Personal history of transient ischemic attack (TIA), and cerebral infarction without residual deficits: Secondary | ICD-10-CM | POA: Diagnosis not present

## 2024-12-02 DIAGNOSIS — F419 Anxiety disorder, unspecified: Secondary | ICD-10-CM | POA: Diagnosis not present

## 2024-12-02 LAB — BASIC METABOLIC PANEL WITH GFR
Anion gap: 8 (ref 5–15)
BUN: 15 mg/dL (ref 8–23)
CO2: 27 mmol/L (ref 22–32)
Calcium: 8.5 mg/dL — ABNORMAL LOW (ref 8.9–10.3)
Chloride: 103 mmol/L (ref 98–111)
Creatinine, Ser: 0.63 mg/dL (ref 0.61–1.24)
GFR, Estimated: >60 mL/min
Glucose, Bld: 150 mg/dL — ABNORMAL HIGH (ref 70–99)
Potassium: 4.7 mmol/L (ref 3.5–5.1)
Sodium: 138 mmol/L (ref 135–145)

## 2024-12-02 LAB — CULTURE, RESPIRATORY W GRAM STAIN

## 2024-12-02 LAB — CULTURE, BLOOD (ROUTINE X 2): Special Requests: ADEQUATE

## 2024-12-02 MED ORDER — PREDNISONE 50 MG PO TABS
50.0000 mg | ORAL_TABLET | Freq: Every day | ORAL | Status: DC
  Administered 2024-12-03 – 2024-12-04 (×2): 50 mg via ORAL
  Filled 2024-12-02 (×2): qty 1

## 2024-12-02 MED ORDER — HYDRALAZINE HCL 25 MG PO TABS
25.0000 mg | ORAL_TABLET | Freq: Three times a day (TID) | ORAL | Status: DC | PRN
  Administered 2024-12-02: 25 mg via ORAL
  Filled 2024-12-02: qty 1

## 2024-12-02 NOTE — Progress Notes (Signed)
 SATURATION QUALIFICATIONS: (This note is used to comply with regulatory documentation for home oxygen)  Patient Saturations on Room Air at Rest = 93%  Patient Saturations on Room Air while Ambulating = 85%  Patient Saturations on 2 Liters of oxygen while Ambulating = 93%  Please briefly explain why patient needs home oxygen: to properly oxygenate

## 2024-12-02 NOTE — Progress Notes (Signed)
" °   12/02/24 2223  BiPAP/CPAP/SIPAP  BiPAP/CPAP/SIPAP Pt Type Adult  Reason BIPAP/CPAP not in use Non-compliant (Pt refusing CPAP for another night. RT advised to call if he changes his mind.)    "

## 2024-12-02 NOTE — Progress Notes (Signed)
 [facility]  PROGRESS NOTE  [name] [name] FMW:[id number] DOB: 11/09/1960 DOA: 11/28/2024 PCP: [name], FNP   Brief HPI:    64 y.o. male with medical history significant of seasonal allergies, anxiety, depression, asthma, COPD, history of DVT, history of PE, history of CVA, EF preserved heart failure, hyperlipidemia, hypertension, migraine headaches, left sciatic nerve neuropathy, history of opioid and substance abuse, tobacco abuse, status post spinal surgery, history of back pain who presented to the emergency department with complaints of pleuritic chest pain after falling a couple of days ago, but also endorsed fatigue, dyspnea, fever, sore throat, wheezing and yellowish sputum productive cough.  No palpitations, diaphoresis, PND, orthopnea or pitting edema of the lower extremities.  No abdominal pain, nausea, emesis, diarrhea, constipation, melena or hematochezia.  No flank pain, dysuria, frequency or hematuria.  No polyuria, polydipsia, polyphagia or blurred vision.    Lab work: CBC showed white count 7.7, hemoglobin 11.1 g/dL platelets 749.  Normal PT and INR.  Normal lactic acid.  Positive influenza A PCR.  First troponin level was 79 ng/L.  BMP showed potassium of 3.4 mmol/L and a calcium  of 8.3 mg/dL, the rest of the electrolytes, glucose and renal function were normal.   Imaging: 2 view chest radiograph show patchy bibasilar opacification worse on the right than the left likely due to early infection.  No acute rib fracture.   ED course: Initial vital signs were temperature 100.6 F, pulse 86, respiration 20, BP 172/92 mmHg and O2 sat 86% on room air.  The patient received acetaminophen  650 mg p.o. x 1, azithromycin  500 mg p.o. x 1, ceftriaxone  2 g IVPB x 1 and 1000 mL of normal saline bolus.  I added KCl 40 mEq p.o. x 1 and magnesium  sulfate 2 g IVPB.  1/26: Vitals and labs stable.  Still needing 2 L of oxygen with no baseline oxygen use but history of significant COPD and  heavy smoker.  Home oxygen was ordered.  Becoming quite short of breath with ambulation so PT ordered.     Assessment/Plan:    Acute respiratory failure with hypoxia (HCC) Secondary to:   COPD with acute exacerbation (HCC) Due to:   Influenza A With superimposed:   Pulmonary infiltrates -Improving -Continue Tamiflu  -Continue Rocephin , doxycycline  -Continue DuoNeb nebulizers every 6 hours - Mucinex  600 mg p.o. twice daily - Switching Solu-Medrol  with prednisone  - Still needing 2 L of oxygen with recorded hypoxia up to 85% on room air, home oxygen ordered. - Continue supplemental oxygen-wean as tolerated  Active Problems:   Hypokalemia Repleted and resolved    Essential hypertension Blood pressure mildly elevated. - Continuing home Lasix , irbesartan     Obesity, Class II, BMI 35-39.9 Current BMI 37.51 kg/m. Would benefit from lifestyle modifications. Follow-up closely with PCP and/or bariatric clinic.    Anxiety and depression Continue duloxetine  60 mg p.o. daily.    OSA on CPAP CPAP at bedtime.     (HFpEF) heart failure with preserved ejection fraction (HCC) Will  resume furosemide  40 mg daily   Hyperlipidemia Follow-up with PCP. SABRA   Low back pain   Chronic pain syndrome Continue daily methadone  130 mg p.o. daily..     Tobacco use -History of significant smoking, currently trying to wean but still smoking 1-1/2 pack daily. - Nicotine  patch     Anemia Monitor hematocrit and hemoglobin.     History of CVA (cerebrovascular accident) Supportive care.     History of DVT (deep vein thrombosis)  History of pulmonary embolism  Continue rivaroxaban  20 mg p.o. daily.   Tinea cruris - Start nystatin  topical powder 3 times a Zani for 3 days  DVT prophylaxis: Xarelto   Medications     doxycycline   100 mg Oral Q12H   DULoxetine   30 mg Oral Daily   furosemide   40 mg Oral Daily   gabapentin   200 mg Oral TID   guaiFENesin   600 mg Oral BID    ipratropium-albuterol   3 mL Nebulization Q6H   irbesartan   300 mg Oral Daily   lidocaine   1 patch Transdermal Daily   loratadine   10 mg Oral Daily   methadone   130 mg Oral Daily   methylPREDNISolone  (SOLU-MEDROL ) injection  40 mg Intravenous BID   nystatin    Topical TID   oseltamivir   75 mg Oral BID   rivaroxaban   20 mg Oral Q supper     Data Reviewed:   CBG:  No results for input(s): GLUCAP in the last 168 hours.  SpO2: 94 % O2 Flow Rate (L/min): 2 L/min    Vitals:   12/01/24 2206 12/02/24 0224 12/02/24 0608 12/02/24 1049  BP:   (!) 169/92   Pulse:   (!) 52   Resp:   18   Temp:   99.1 F (37.3 C)   TempSrc:   Axillary   SpO2: 96% 97% 96% 94%  Weight:      Height:        Data Reviewed:  Basic Metabolic Panel: Recent Labs  Lab 11/28/24 0924 11/28/24 1408 11/29/24 0427 12/02/24 0322  NA 136  --  139 138  K 3.4*  --  4.1 4.7  CL 101  --  107 103  CO2 25  --  22 27  GLUCOSE 90  --  149* 150*  BUN 12  --  13 15  CREATININE 0.70  --  0.57* 0.63  CALCIUM  8.3*  --  8.1* 8.5*  MG  --  2.5*  --   --   PHOS  --  2.7  --   --     CBC: Recent Labs  Lab 11/28/24 0924 11/29/24 0427  WBC 7.7 6.6  NEUTROABS  --  5.5  HGB 11.1* 10.2*  HCT 34.5* 32.1*  MCV 92.2 91.5  PLT 250 201    LFT Recent Labs  Lab 11/29/24 0427  AST 43*  ALT 21  ALKPHOS 71  BILITOT <0.2  PROT 5.9*  ALBUMIN 2.7*     Antibiotics: Anti-infectives (From admission, onward)    Start     Dose/Rate Route Frequency Ordered Stop   11/29/24 1000  cefTRIAXone  (ROCEPHIN ) 2 g in sodium chloride  0.9 % 100 mL IVPB        2 g 200 mL/hr over 30 Minutes Intravenous Every 24 hours 11/28/24 1125 12/02/24 0951   11/29/24 1000  azithromycin  (ZITHROMAX ) tablet 500 mg  Status:  Discontinued        500 mg Oral Daily 11/28/24 1125 11/28/24 1738   11/29/24 1000  doxycycline  (VIBRA -TABS) tablet 100 mg        100 mg Oral Every 12 hours 11/28/24 1741     11/28/24 1245  oseltamivir  (TAMIFLU ) capsule 75  mg        75 mg Oral 2 times daily 11/28/24 1231 12/03/24 0959   11/28/24 1000  azithromycin  (ZITHROMAX ) tablet 500 mg        500 mg Oral  Once 11/28/24 0957 11/28/24 1008   11/28/24 0945  cefTRIAXone  (ROCEPHIN )  2 g in sodium chloride  0.9 % 100 mL IVPB        2 g 200 mL/hr over 30 Minutes Intravenous Once 11/28/24 0934 11/28/24 1038   11/28/24 0945  azithromycin  (ZITHROMAX ) 500 mg in sodium chloride  0.9 % 250 mL IVPB  Status:  Discontinued        500 mg 250 mL/hr over 60 Minutes Intravenous  Once 11/28/24 9065 11/28/24 0957        CONSULTS   Code Status: Full code  Family Communication: Discussed with patient   Subjective  Patient still feeling short of breath specially with minor exertion.  Still smoking although trying to decrease the amount.   Objective    Physical Examination:  General.  Obese gentleman, in no acute distress. Pulmonary.  Mildly coarse breath sounds bilaterally, normal respiratory effort. CV.  Regular rate and rhythm, no JVD, rub or murmur. Abdomen.  Soft, nontender, nondistended, BS positive. CNS.  Alert and oriented .  No focal neurologic deficit. Extremities.  No edema,  pulses intact and symmetrical. Psychiatry.  Judgment and insight appears normal.    This record has been created using Conservation officer, historic buildings. Errors have been sought and corrected,but may not always be located. Such creation errors do not reflect on the standard of care.     Amaryllis Dare, MD   [facility] If 7PM-7AM, please contact night-coverage at [email or web address], Office  [phone]   12/02/2024, 12:26 PM  LOS: 4 days

## 2024-12-03 DIAGNOSIS — F419 Anxiety disorder, unspecified: Secondary | ICD-10-CM | POA: Diagnosis not present

## 2024-12-03 DIAGNOSIS — E876 Hypokalemia: Secondary | ICD-10-CM | POA: Diagnosis not present

## 2024-12-03 DIAGNOSIS — I10 Essential (primary) hypertension: Secondary | ICD-10-CM | POA: Diagnosis not present

## 2024-12-03 DIAGNOSIS — J9601 Acute respiratory failure with hypoxia: Secondary | ICD-10-CM | POA: Diagnosis not present

## 2024-12-03 LAB — CULTURE, BLOOD (ROUTINE X 2)
Culture: NO GROWTH
Special Requests: ADEQUATE

## 2024-12-03 MED ORDER — NICOTINE 14 MG/24HR TD PT24
14.0000 mg | MEDICATED_PATCH | Freq: Every day | TRANSDERMAL | Status: DC
  Administered 2024-12-04: 14 mg via TRANSDERMAL
  Filled 2024-12-03 (×2): qty 1

## 2024-12-03 NOTE — TOC Transition Note (Signed)
 Transition of Care [facility]) - Discharge Note   Patient Details  Name: [name] [name] MRN: [id number] Date of Birth: Sep 12, 1961  [facility]) CM/SW Contact:  Tawni CHRISTELLA Eva, LCSW Phone Number: 12/03/2024, 10:22 AM   Clinical Narrative:     Pt rec form home oxygen. referral made to [facility] for o2 to be delivered to pt's room prior to d/c.   [place]  2:00pm  CSW received a message from the MD regarding the pt being recommended for SNF placement.   4:00pm CSW spoke with the pt to discuss the recommendation for SNF placement. The pt declined and stated they would like to return home. CSW informed the pt about the barriers to arranging home health services. The pt verbalized understanding. CSW sent the pts information for home health services. ICM to follow.     Patient Goals and CMS Choice Patient states their goals for this hospitalization and ongoing recovery are:: return home          Discharge Placement                    Patient and family notified of of transfer: 12/03/24  Discharge Plan and Services Additional resources added to the After Visit Summary for                  DME Arranged: Oxygen DME Agency: [employer] Date DME Agency Contacted: 12/03/24 Time DME Agency Contacted: 1021 Representative spoke with at DME Agency: [place]            Social Drivers of Health (SDOH) Interventions SDOH Screenings   Food Insecurity: No Food Insecurity (11/28/2024)  Housing: Low Risk (11/28/2024)  Transportation Needs: No Transportation Needs (11/28/2024)  Recent Concern: Transportation Needs - Unmet Transportation Needs (10/09/2024)  Utilities: Not At Risk (11/28/2024)  Depression (PHQ2-9): High Risk (07/22/2024)  Financial Resource Strain: High Risk (01/10/2024)  Physical Activity: Unknown (01/10/2024)  Social Connections: Socially Isolated (11/11/2024)  Stress: Stress Concern Present (01/10/2024)  Tobacco Use: High Risk (11/28/2024)      Readmission Risk Interventions    11/13/2024   10:46 AM 07/02/2024    3:32 PM  Readmission Risk Prevention Plan  Transportation Screening Complete Complete  PCP or Specialist Appt within 5-7 Days  Complete  Home Care Screening  Complete  Medication Review (RN CM)  Complete  HRI or Home Care Consult Complete   Social Work Consult for Recovery Care Planning/Counseling Complete   Palliative Care Screening Not Applicable   Medication Review Oceanographer) Complete

## 2024-12-03 NOTE — Discharge Instructions (Signed)
 [facility] offers a [employer] that anyone can call toll free at [phone]. The friendship line is available 24 hours a day and you can call in whenever you like and receive calls from them.  [place] [address] also has on demand information for what resources are available in the area.

## 2024-12-03 NOTE — Evaluation (Signed)
 Physical Therapy Evaluation Patient Details Name: [name] [name] MRN: [id number] DOB: [birth year] Today's Date: 12/03/2024  History of Present Illness  64 yo male presents to therapy following hospital admission on 11/28/2024 due to chest pain, fall, fatigue, dyspnea, fever, sore throat, wheezing and yellowish sputum. Pt found to have acute respiratory failure with hypoxia due to influenza. Pt PMH includes but is not limited to: anxiety, depression, asthma, COPD, DVT, PE, CVA, Efpreserved heart failure, HLD, HTN, HA, L sciatic nerve neuropathy, substance abuse, LBP s/p spinal surgery.  Clinical Impression  Pt admitted with above diagnosis.  Pt currently with functional limitations due to the deficits listed below (see PT Problem List). PT arrived and pt on the way back from the bathroom, noted to be unstable attempting to furniture walk with use of door and bed foot board, pt exhibited difficulty managing [place] tube and signs and symptoms of SOB. Pt encouraged to have staff assist and use of personal [facility] for safety and stability. Once seated EOB PT assessed O2 saturation with pt found to be 83% on 3 L/min, coaching and cues for pursed lip breathing pt demonstrating difficulty with return demonstration and pt required seated therapeutic rest break of 3:02 to recover to 90%. PT shortened [place] tube for increased efficiency with supplemental o2 delivery, nurse made aware of O2 findings and [place] tube modifications. Pt desaturated with shortened [place] tube when verbally communicating with therapist to 87% on 3 L/min and PT redirecting pt to pursed lip breathing with pt maintaining >/=90% when at rest and not speaking. Pt left seated EOB, all needs in place and set up for breakfast.  PT communicated with MD per pt desaturation, generalized weakness, instability with gait tasks and current recommendation for SNF at time of d/c. Patient will benefit from continued inpatient follow up therapy, <3 hours/[place]. Pt will benefit from  acute skilled PT to increase their independence and safety with mobility to allow discharge.         If plan is discharge home, recommend the following: A little help with walking and/or transfers;A little help with bathing/dressing/bathroom;Assistance with cooking/housework;Help with stairs or ramp for entrance;Assist for transportation   Can travel by private vehicle   No    Equipment Recommendations None recommended by PT  Recommendations for Other Services       Functional Status Assessment Patient has had a recent decline in their functional status and demonstrates the ability to make significant improvements in function in a reasonable and predictable amount of time.     Precautions / Restrictions Precautions Precautions: Fall (monitor O2) Recall of Precautions/Restrictions: Impaired Restrictions Weight Bearing Restrictions Per Provider Order: No      Mobility  Bed Mobility Overal bed mobility: Independent                  Transfers Overall transfer level: Needs assistance Equipment used: None Transfers: Sit to/from Stand Sit to Stand: Supervision           General transfer comment: pt noted to be unstable with transfer tasks, cues for safety, strongly encoraged to use call bell and await for assist and use of SPC for stabiltiy A to manage [place] tube    Ambulation/Gait Ambulation/Gait assistance: Contact guard assist Gait Distance (Feet): 12 Feet Assistive device: None Gait Pattern/deviations: Step-to pattern, Shuffle, Wide base of support, Trunk flexed Gait velocity: decreased     General Gait Details: PT arrived and pt returning from bathroom and noted instabiltiy, pt attempting to furniture walk, pt  declining HHA and slightly resistive to support at L UE, pt required A for [place] tube management, pt exhibiting signs and symptoms of SOB with pt O2 saturation decreasing to 83% on 3 L/min  Stairs            Wheelchair Mobility     Tilt Bed     Modified Rankin (Stroke Patients Only)       Balance Overall balance assessment: History of Falls, Mild deficits observed, not formally tested                                           Pertinent Vitals/Pain Pain Assessment Pain Assessment: Faces Faces Pain Scale: Hurts little more Pain Location: chest, back Pain Descriptors / Indicators: Aching, Constant, Grimacing Pain Intervention(s): Limited activity within patient's tolerance, Monitored during session    Home Living Family/patient expects to be discharged to:: Private residence Living Arrangements: Non-relatives/Friends Available Help at Discharge: Friend(s);Available PRN/intermittently Type of Home: Mobile home Home Access: Stairs to enter Entrance Stairs-Rails: Right;Left Entrance Stairs-Number of Steps: 4   Home Layout: One level Home Equipment: Cane - single Librarian, Academic (2 wheels)      Prior Function Prior Level of Function : Independent/Modified Independent;History of Falls (last six months)             Mobility Comments: SPC for household mobility and pt reports increased time for all ADLs, and self care tasks       Extremity/Trunk Assessment        Lower Extremity Assessment Lower Extremity Assessment: Generalized weakness (B LE wrapped with bandages)    Cervical / Trunk Assessment Cervical / Trunk Assessment: Kyphotic  Communication   Communication Communication: No apparent difficulties    Cognition Arousal: Alert Behavior During Therapy: WFL for tasks assessed/performed   PT - Cognitive impairments: No apparent impairments                       PT - Cognition Comments: very soft spoken Following commands: Intact       Cueing       General Comments      Exercises     Assessment/Plan    PT Assessment Patient needs continued PT services  PT Problem List Decreased activity tolerance;Decreased balance;Decreased mobility;Decreased  coordination;Cardiopulmonary status limiting activity;Pain;Decreased skin integrity       PT Treatment Interventions DME instruction;Gait training;Stair training;Functional mobility training;Therapeutic activities;Therapeutic exercise;Balance training;Neuromuscular re-education;Patient/family education    PT Goals (Current goals can be found in the Care Plan section)  Acute Rehab PT Goals Patient Stated Goal: to get well and go home PT Goal Formulation: With patient Time For Goal Achievement: 12/16/24 Potential to Achieve Goals: Fair    Frequency Min 2X/week     Co-evaluation               AM-PAC PT 6 Clicks Mobility  Outcome Measure Help needed turning from your back to your side while in a flat bed without using bedrails?: None Help needed moving from lying on your back to sitting on the side of a flat bed without using bedrails?: None Help needed moving to and from a bed to a chair (including a wheelchair)?: A Little Help needed standing up from a chair using your arms (e.g., wheelchair or bedside chair)?: A Little Help needed to walk in hospital room?: A Little Help needed climbing 3-5  steps with a railing? : Total 6 Click Score: 18    End of Session Equipment Utilized During Treatment: Oxygen Activity Tolerance: Patient limited by fatigue;Other (comment) (O2 saturation) Patient left: in bed;with call bell/phone within reach Nurse Communication: Mobility status;Other (comment) (o2 findings and shortening of [place] tube as well as poor safety awareness and high fall risk) PT Visit Diagnosis: Unsteadiness on feet (R26.81);Other abnormalities of gait and mobility (R26.89);Muscle weakness (generalized) (M62.81);History of falling (Z91.81);Pain;Difficulty in walking, not elsewhere classified (R26.2) Pain - Right/Left:  (back, chest)    Time: 8983-8960 PT Time Calculation (min) (ACUTE ONLY): 23 min   Charges:   PT Evaluation $PT Eval Low Complexity: 1 Low PT  Treatments $Therapeutic Activity: 8-22 mins PT General Charges $$ ACUTE PT VISIT: 1 Visit         [place], PT Acute Rehab   [place] VEAR Drone 12/03/2024, 12:48 PM

## 2024-12-03 NOTE — Progress Notes (Signed)
 [facility]  PROGRESS NOTE  [name] [name] FMW:[id number] DOB: [birth year] DOA: 11/28/2024 PCP: [name], FNP   Brief HPI:    64 y.o. male with medical history significant of seasonal allergies, anxiety, depression, asthma, COPD, history of DVT, history of PE, history of CVA, EF preserved heart failure, hyperlipidemia, hypertension, migraine headaches, left sciatic nerve neuropathy, history of opioid and substance abuse, tobacco abuse, status post spinal surgery, history of back pain who presented to the emergency department with complaints of pleuritic chest pain after falling a couple of days ago, but also endorsed fatigue, dyspnea, fever, sore throat, wheezing and yellowish sputum productive cough.  No palpitations, diaphoresis, PND, orthopnea or pitting edema of the lower extremities.  No abdominal pain, nausea, emesis, diarrhea, constipation, melena or hematochezia.  No flank pain, dysuria, frequency or hematuria.  No polyuria, polydipsia, polyphagia or blurred vision.    Lab work: CBC showed white count 7.7, hemoglobin 11.1 g/dL platelets 749.  Normal PT and INR.  Normal lactic acid.  Positive influenza A PCR.  First troponin level was 79 ng/L.  BMP showed potassium of 3.4 mmol/L and a calcium  of 8.3 mg/dL, the rest of the electrolytes, glucose and renal function were normal.   Imaging: 2 view chest radiograph show patchy bibasilar opacification worse on the right than the left likely due to early infection.  No acute rib fracture.   ED course: Initial vital signs were temperature 100.6 F, pulse 86, respiration 20, BP 172/92 mmHg and O2 sat 86% on room air.  The patient received acetaminophen  650 mg p.o. x 1, azithromycin  500 mg p.o. x 1, ceftriaxone  2 g IVPB x 1 and 1000 mL of normal saline bolus.  I added KCl 40 mEq p.o. x 1 and magnesium  sulfate 2 g IVPB.  1/26: Vitals and labs stable.  Still needing 2 L of oxygen with no baseline oxygen use but history of significant COPD and  heavy smoker.  Home oxygen was ordered.  Becoming quite short of breath with ambulation so PT ordered.  1/27: Hemodynamically stable but do desaturate with minor exertion and feeling very weak.  He will get benefit from going to rehab but PT, TOC to see if we can find a placement as he is a Medicaid patient.    Assessment/Plan:    Acute respiratory failure with hypoxia (HCC) Secondary to:   COPD with acute exacerbation (HCC) Due to:   Influenza A With superimposed:   Pulmonary infiltrates -Improving -Continue Tamiflu  -Continue Rocephin , doxycycline  -Continue DuoNeb nebulizers every 6 hours - Mucinex  600 mg p.o. twice daily - Switching Solu-Medrol  with prednisone  - Still needing 2 L of oxygen with recorded hypoxia up to 85% on room air, home oxygen ordered.  Significant further desaturation with minor exertion - Continue supplemental oxygen-wean as tolerated  Active Problems:   Hypokalemia Repleted and resolved    Essential hypertension Blood pressure mildly elevated. - Continuing home Lasix , irbesartan     Obesity, Class II, BMI 35-39.9 Current BMI 37.51 kg/m. Would benefit from lifestyle modifications. Follow-up closely with PCP and/or bariatric clinic.    Anxiety and depression Continue duloxetine  60 mg p.o. daily.    OSA on CPAP CPAP at bedtime.     (HFpEF) heart failure with preserved ejection fraction (HCC) Will  resume furosemide  40 mg daily   Hyperlipidemia Follow-up with PCP. SABRA   Low back pain   Chronic pain syndrome Continue daily methadone  130 mg p.o. daily..     Tobacco use -History of significant  smoking, currently trying to wean but still smoking 1-1/2 pack daily. - Nicotine  patch     Anemia Monitor hematocrit and hemoglobin.     History of CVA (cerebrovascular accident) Supportive care.     History of DVT (deep vein thrombosis)    History of pulmonary embolism  Continue rivaroxaban  20 mg p.o. daily.   Tinea cruris - Start nystatin   topical powder 3 times a Plog for 3 days  DVT prophylaxis: Xarelto   Medications     doxycycline   100 mg Oral Q12H   DULoxetine   30 mg Oral Daily   furosemide   40 mg Oral Daily   gabapentin   200 mg Oral TID   guaiFENesin   600 mg Oral BID   ipratropium-albuterol   3 mL Nebulization Q6H   irbesartan   300 mg Oral Daily   lidocaine   1 patch Transdermal Daily   loratadine   10 mg Oral Daily   methadone   130 mg Oral Daily   nystatin    Topical TID   predniSONE   50 mg Oral Q breakfast   rivaroxaban   20 mg Oral Q supper     Data Reviewed:   CBG:  No results for input(s): GLUCAP in the last 168 hours.  SpO2: 93 % O2 Flow Rate (L/min): 2 L/min    Vitals:   12/02/24 2222 12/03/24 0139 12/03/24 0359 12/03/24 0928  BP:   (!) 158/116   Pulse:   68   Resp:   17   Temp:   98.2 F (36.8 C)   TempSrc:      SpO2: 97% 95% 93% 93%  Weight:      Height:        Data Reviewed:  Basic Metabolic Panel: Recent Labs  Lab 11/28/24 0924 11/28/24 1408 11/29/24 0427 12/02/24 0322  NA 136  --  139 138  K 3.4*  --  4.1 4.7  CL 101  --  107 103  CO2 25  --  22 27  GLUCOSE 90  --  149* 150*  BUN 12  --  13 15  CREATININE 0.70  --  0.57* 0.63  CALCIUM  8.3*  --  8.1* 8.5*  MG  --  2.5*  --   --   PHOS  --  2.7  --   --     CBC: Recent Labs  Lab 11/28/24 0924 11/29/24 0427  WBC 7.7 6.6  NEUTROABS  --  5.5  HGB 11.1* 10.2*  HCT 34.5* 32.1*  MCV 92.2 91.5  PLT 250 201    LFT Recent Labs  Lab 11/29/24 0427  AST 43*  ALT 21  ALKPHOS 71  BILITOT <0.2  PROT 5.9*  ALBUMIN 2.7*     Antibiotics: Anti-infectives (From admission, onward)    Start     Dose/Rate Route Frequency Ordered Stop   11/29/24 1000  cefTRIAXone  (ROCEPHIN ) 2 g in sodium chloride  0.9 % 100 mL IVPB        2 g 200 mL/hr over 30 Minutes Intravenous Every 24 hours 11/28/24 1125 12/02/24 0951   11/29/24 1000  azithromycin  (ZITHROMAX ) tablet 500 mg  Status:  Discontinued        500 mg Oral Daily 11/28/24  1125 11/28/24 1738   11/29/24 1000  doxycycline  (VIBRA -TABS) tablet 100 mg        100 mg Oral Every 12 hours 11/28/24 1741     11/28/24 1245  oseltamivir  (TAMIFLU ) capsule 75 mg        75 mg Oral 2 times daily  11/28/24 1231 12/02/24 2111   11/28/24 1000  azithromycin  (ZITHROMAX ) tablet 500 mg        500 mg Oral  Once 11/28/24 0957 11/28/24 1008   11/28/24 0945  cefTRIAXone  (ROCEPHIN ) 2 g in sodium chloride  0.9 % 100 mL IVPB        2 g 200 mL/hr over 30 Minutes Intravenous Once 11/28/24 0934 11/28/24 1038   11/28/24 0945  azithromycin  (ZITHROMAX ) 500 mg in sodium chloride  0.9 % 250 mL IVPB  Status:  Discontinued        500 mg 250 mL/hr over 60 Minutes Intravenous  Once 11/28/24 9065 11/28/24 0957        CONSULTS   Code Status: Full code  Family Communication: Discussed with patient   Subjective  Patient still feeling very weak and short of breath with minor exertion.   Objective    Physical Examination:  General.  Chronically ill-appearing, obese gentleman, in no acute distress. Pulmonary.  Lungs clear bilaterally, normal respiratory effort. CV.  Regular rate and rhythm, no JVD, rub or murmur. Abdomen.  Soft, nontender, nondistended, BS positive. CNS.  Alert and oriented .  No focal neurologic deficit. Extremities.  Wrapped lower extremities Psychiatry.  Judgment and insight appears normal.   This record has been created using Conservation officer, historic buildings. Errors have been sought and corrected,but may not always be located. Such creation errors do not reflect on the standard of care.    Amaryllis Dare, MD   [facility] If 7PM-7AM, please contact night-coverage at [email or web address], Office  [phone]   12/03/2024, 12:07 PM  LOS: 5 days

## 2024-12-03 NOTE — Progress Notes (Signed)
" °   12/03/24 2228  BiPAP/CPAP/SIPAP  BiPAP/CPAP/SIPAP Pt Type Adult  Reason BIPAP/CPAP not in use Non-compliant    "

## 2024-12-04 ENCOUNTER — Other Ambulatory Visit (HOSPITAL_COMMUNITY): Payer: Self-pay

## 2024-12-04 DIAGNOSIS — J9601 Acute respiratory failure with hypoxia: Secondary | ICD-10-CM | POA: Diagnosis not present

## 2024-12-04 MED ORDER — IPRATROPIUM-ALBUTEROL 0.5-2.5 (3) MG/3ML IN SOLN
3.0000 mL | Freq: Three times a day (TID) | RESPIRATORY_TRACT | Status: DC
  Administered 2024-12-04: 3 mL via RESPIRATORY_TRACT
  Filled 2024-12-04: qty 3

## 2024-12-04 MED ORDER — DOXYCYCLINE HYCLATE 100 MG PO TABS
100.0000 mg | ORAL_TABLET | Freq: Two times a day (BID) | ORAL | Status: DC
  Administered 2024-12-04: 100 mg via ORAL
  Filled 2024-12-04: qty 1

## 2024-12-04 MED ORDER — PREDNISONE 10 MG PO TABS
ORAL_TABLET | ORAL | 0 refills | Status: DC
  Filled 2024-12-04: qty 20, 8d supply, fill #0

## 2024-12-04 MED ORDER — DOXYCYCLINE HYCLATE 100 MG PO TABS
100.0000 mg | ORAL_TABLET | Freq: Two times a day (BID) | ORAL | 0 refills | Status: AC
  Filled 2024-12-04: qty 4, 2d supply, fill #0

## 2024-12-04 MED ORDER — GUAIFENESIN ER 600 MG PO TB12
600.0000 mg | ORAL_TABLET | Freq: Two times a day (BID) | ORAL | 0 refills | Status: AC
  Filled 2024-12-04: qty 14, 7d supply, fill #0

## 2024-12-04 NOTE — Discharge Summary (Signed)
 "  Physician Discharge Summary  [name] [name] FMW:[id number] DOB: 26-Jan-1961 DOA: 11/28/2024  PCP: [name], FNP  Admit date: 11/28/2024 Discharge date: 12/04/2024  Admitted From: home Disposition:  home  Recommendations for Outpatient Follow-up:  Follow up with PCP in 1-2 weeks Please obtain BMP/CBC in one week  Home Health: PT Equipment/Devices: home O2  Discharge Condition: stable CODE STATUS: Full code Diet Orders (From admission, onward)     Start     Ordered   11/28/24 1134  Diet Heart Room service appropriate? Yes; Fluid consistency: Thin  Diet effective now       Question Answer Comment  Room service appropriate? Yes   Fluid consistency: Thin      11/28/24 1137            Brief HPI:   64 y.o. male with medical history significant of seasonal allergies, anxiety, depression, asthma, COPD, history of DVT, history of PE, history of CVA, EF preserved heart failure, hyperlipidemia, hypertension, migraine headaches, left sciatic nerve neuropathy, history of opioid and substance abuse, tobacco abuse, status post spinal surgery, history of back pain who presented to the emergency department with complaints of pleuritic chest pain after falling a couple of days ago, but also endorsed fatigue, dyspnea, fever, sore throat, wheezing and yellowish sputum productive cough.  No palpitations, diaphoresis, PND, orthopnea or pitting edema of the lower extremities.  No abdominal pain, nausea, emesis, diarrhea, constipation, melena or hematochezia.  No flank pain, dysuria, frequency or hematuria.  No polyuria, polydipsia, polyphagia or blurred vision.   Hospital Course / Discharge diagnoses: Principal problem Acute respiratory failure with hypoxia due to COPD exacerbation due to Influenza A - patient admitted to the hospital with wheezing, shortness of breath, tested positive for influenza A. He was started on tamiflu  and empiric antibiotics with improvement in his respiratory  status. He was also placed on steroids. His wheezing resolved, breathing is better, and will be discharged home in stable condition. Of note, PT recommends SNF however patient declines, and HH will be maximized as able. Given underlying COPD and ongoing tobacco use, he will also require home O2 on discharge.    Active Problems: Hypokalemia - Repleted and resolved Essential hypertension - Blood pressure stable, continuing home Lasix , irbesartan . Hold hydralazine  since his pressure is normal  Obesity, Class II, BMI 35-39.9 - Current BMI 37.51 kg/m. Anxiety and depression - Continue home regimen OSA on CPAP - CPAP at bedtime. (HFpEF) heart failure with preserved ejection fraction - Will  resume furosemide  40 mg daily Low back pain, Chronic pain syndrome - Continue daily methadone  130 mg p.o. daily.. Tobacco use -History of significant smoking, currently trying to wean but still smoking 1-1/2 pack daily. He doesn't want a nicotine  patch History of CVA - Supportive care. History of DVT, History of pulmonary embolism  - Continue rivaroxaban  20 mg p.o. daily. Tinea cruris - s/p nystatin  topical powder 3 times a Olsen for 3 days  Sepsis ruled out   Discharge Instructions   Allergies as of 12/04/2024       Reactions   Amlodipine  Swelling, Other (See Comments)   BLE peripheral edema         Medication List     STOP taking these medications    hydrALAZINE  100 MG tablet Commonly known as: APRESOLINE        TAKE these medications    albuterol  108 (90 Base) MCG/ACT inhaler Commonly known as: VENTOLIN  HFA Inhale 2 puffs into the lungs every 6 (  six) hours as needed for wheezing or shortness of breath.   aspirin  EC 325 MG tablet Take 650-975 mg by mouth 2 (two) times daily as needed (for headaches or mild pain).   Centrum Silver Men 50+ Tabs Take 1 tablet by mouth daily with breakfast.   doxycycline  100 MG tablet Commonly known as: VIBRA -TABS Take 1 tablet (100 mg total) by mouth  every 12 (twelve) hours for 2 days.   DULoxetine  30 MG capsule Commonly known as: CYMBALTA  Take 1 capsule (30 mg total) by mouth daily.   fluticasone  50 MCG/ACT nasal spray Commonly known as: FLONASE  Place 2 sprays into both nostrils daily. What changed: when to take this   furosemide  40 MG tablet Commonly known as: Lasix  Take 1 tablet (40 mg total) by mouth in the morning.   gabapentin  100 MG capsule Commonly known as: NEURONTIN  Take 2 capsules (200 mg total) by mouth 3 (three) times daily.   guaiFENesin  600 MG 12 hr tablet Commonly known as: MUCINEX  Take 1 tablet (600 mg total) by mouth 2 (two) times daily for 7 days.   hydrOXYzine  25 MG tablet Commonly known as: ATARAX  Take 1 tablet (25 mg total) by mouth 3 (three) times daily as needed for anxiety.   lidocaine  5 % Commonly known as: Lidoderm  Place 1 patch onto the skin daily. Remove & Discard patch within 12 hours or as directed by MD   loratadine  10 MG tablet Commonly known as: CLARITIN  Take 1 tablet (10 mg total) by mouth daily. What changed:  when to take this reasons to take this   methadone  10 MG/ML solution Commonly known as: DOLOPHINE  Take 130 mg by mouth in the morning.   predniSONE  10 MG tablet Commonly known as: DELTASONE  Take 4 tablets (40 mg total) by mouth daily with breakfast for 2 days, THEN 3 tablets (30 mg total) daily with breakfast for 2 days, THEN 2 tablets (20 mg total) daily with breakfast for 2 days, THEN 1 tablet (10 mg total) daily with breakfast for 2 days. Start taking on: December 04, 2024   sodium chloride  0.65 % Soln nasal spray Commonly known as: OCEAN Place 1 spray into both nostrils as needed for congestion.   valsartan  320 MG tablet Commonly known as: DIOVAN  Take 1 tablet (320 mg total) by mouth daily.   Xarelto  20 MG Tabs tablet Generic drug: rivaroxaban  Take 1 tablet (20 mg total) by mouth daily with supper. What changed: when to take this               Durable  Medical Equipment  (From admission, onward)           Start     Ordered   12/04/24 0820  For home use only DME oxygen  Once       Question Answer Comment  Length of Need Lifetime   Liters per Minute 4   Frequency Continuous (stationary and portable oxygen unit needed)   Oxygen delivery system: Gas      12/04/24 0819   12/02/24 1128  For home use only DME oxygen  Once       Question Answer Comment  Length of Need Lifetime   Mode or (Route) Nasal cannula   Liters per Minute 2   Frequency Continuous (stationary and portable oxygen unit needed)   Oxygen conserving device Yes   Oxygen delivery system: Gas   Oxygen delivery system: Concentrator   Oxygen delivery system: Portable concentrator (POC)      12/02/24  1127           Consultations: none  Procedures/Studies:  DG Chest 2 View Result Date: 11/28/2024 CLINICAL DATA:  Ribbon chest pain after fall a couple days ago. Also complains of low-grade fever and cough. EXAM: CHEST - 2 VIEW COMPARISON:  11/23/2024 FINDINGS: Lungs are adequately inflated with patchy bibasilar opacification right worse than left possibly due to early infection. No effusion. No pneumothorax. Cardiomediastinal silhouette is unremarkable. No acute rib fracture. Degenerative changes of the spine. IMPRESSION: 1. Patchy bibasilar opacification right worse than left likely due to early infection. 2.  No acute rib fracture. Electronically Signed   By: Toribio Agreste M.D.   On: 11/28/2024 10:04   DG Ribs Unilateral W/Chest Left Result Date: 11/23/2024 EXAM: 1 VIEW(S) XRAY OF THE LEFT RIBS AND CHEST 11/23/2024 07:24:17 PM COMPARISON: Comparison CT of the chest 11/12/2024. CLINICAL HISTORY: recent fall and dx with 3rd rib fracture. Pt heard \\T \ felt a popping sound when moving FINDINGS: BONES: Cervical spine surgical hardware. Known left 3rd rib fracture is not well seen. LUNGS AND PLEURA: No consolidation or pulmonary edema. No pleural effusion or pneumothorax.  HEART AND MEDIASTINUM: No acute abnormality of the cardiac and mediastinal silhouettes. IMPRESSION: 1. No acute rib fracture identified; known left third rib fracture is not well visualized. 2. No acute process in the lungs. Electronically signed by: Greig Pique MD 11/23/2024 07:34 PM EST RP Workstation: [id number]   DG CHEST PORT 1 VIEW Result Date: 11/12/2024 CLINICAL DATA:  Chest pain EXAM: PORTABLE CHEST 1 VIEW COMPARISON:  07/01/2024 FINDINGS: Mildly progressive enlargement of the cardiac silhouette with increased prominence of the pulmonary vasculature and interstitial markings. No pleural fluid seen. Thoracic spine degenerative changes and cervical spine fixation hardware. IMPRESSION: Mildly progressive cardiomegaly with interval mild changes of CHF. Electronically Signed   By: Elspeth Bathe M.D.   On: 11/12/2024 15:57   CT Angio Chest Pulmonary Embolism (PE) W or WO Contrast Result Date: 11/12/2024 CLINICAL DATA:  Recurrent syncopal episodes and falls. Elevated D-dimer. EXAM: CT ANGIOGRAPHY CHEST WITH CONTRAST TECHNIQUE: Multidetector CT imaging of the chest was performed using the standard protocol during bolus administration of intravenous contrast. Multiplanar CT image reconstructions and MIPs were obtained to evaluate the vascular anatomy. RADIATION DOSE REDUCTION: This exam was performed according to the departmental dose-optimization program which includes automated exposure control, adjustment of the mA and/or kV according to patient size and/or use of iterative reconstruction technique. CONTRAST:  75mL OMNIPAQUE  IOHEXOL  350 MG/ML SOLN COMPARISON:  04/19/2024 FINDINGS: Cardiovascular: The pulmonary arteries are adequately opacified. There is no evidence of pulmonary embolism. Central pulmonary arteries are of normal caliber. The thoracic aorta is normal in caliber. Atherosclerosis of the aortic arch and descending thoracic aorta. Top-normal heart size. No pericardial fluid identified. No  visualized calcified coronary artery plaque. Mediastinum/Nodes: No enlarged mediastinal, hilar, or axillary lymph nodes. Thyroid  gland, trachea, and esophagus demonstrate no significant findings. Lungs/Pleura: Mild opacity in the right middle lobe is favored to represent atelectasis. There is no evidence of pulmonary edema, pneumothorax, nodule or pleural fluid. Upper Abdomen: Probable mild hepatic steatosis. Musculoskeletal: Mildly displaced and acute appearing fracture of the anterior left third rib. Review of the MIP images confirms the above findings. IMPRESSION: 1. No evidence of pulmonary embolism. 2. Mildly displaced and acute appearing fracture of the anterior left third rib. 3. Mild opacity in the right middle lobe is favored to represent atelectasis. 4. Probable mild hepatic steatosis. 5. Aortic atherosclerosis. Electronically Signed   By:  Marcey Moan M.D.   On: 11/12/2024 14:39   ECHOCARDIOGRAM COMPLETE Result Date: 11/12/2024    ECHOCARDIOGRAM REPORT   Patient Name:   BRAYANT DORR Nobrega Date of Exam: 11/12/2024 Medical Rec #:  [id number]         Height:       71.0 in Accession #:    [id number]        Weight:       250.0 lb Date of Birth:  03/27/1961         BSA:          2.318 m Patient Age:    63 years          BP:           178/95 mmHg Patient Gender: M                 HR:           69 bpm. Exam Location:  Inpatient Procedure: 2D Echo, Cardiac Doppler, Color Doppler and Intracardiac            Opacification Agent (Both Spectral and Color Flow Doppler were            utilized during procedure). Indications:    Syncope  History:        Patient has prior history of Echocardiogram examinations, most                 recent 07/01/2024. COPD, Signs/Symptoms:Syncope; Risk                 Factors:Hypertension, Dyslipidemia and Sleep Apnea.  Sonographer:    Philomena Daring Referring Phys: [id number] VISHAL R PATEL IMPRESSIONS  1. Left ventricular ejection fraction, by estimation, is 55 to 60%. The left ventricle has  normal function. The left ventricle has no regional wall motion abnormalities. Left ventricular diastolic parameters were normal.  2. Right ventricular systolic function was not well visualized. The right ventricular size is not well visualized.  3. Left atrial size was mildly dilated.  4. The mitral valve is normal in structure. No evidence of mitral valve regurgitation. No evidence of mitral stenosis.  5. The aortic valve is tricuspid. Aortic valve regurgitation is not visualized. Aortic valve sclerosis is present, with no evidence of aortic valve stenosis.  6. The inferior vena cava is normal in size with greater than 50% respiratory variability, suggesting right atrial pressure of 3 mmHg. Comparison(s): Prior images reviewed side by side. Function assessment has improved with use of imaging enhancing agent. FINDINGS  Left Ventricle: Left ventricular ejection fraction, by estimation, is 55 to 60%. The left ventricle has normal function. The left ventricle has no regional wall motion abnormalities. Definity  contrast agent was given IV to delineate the left ventricular  endocardial borders. The left ventricular internal cavity size was normal in size. There is no left ventricular hypertrophy. Left ventricular diastolic parameters were normal. Right Ventricle: The right ventricular size is not well visualized. No increase in right ventricular wall thickness. Right ventricular systolic function was not well visualized. Left Atrium: Left atrial size was mildly dilated. Right Atrium: Right atrial size was normal in size. Pericardium: There is no evidence of pericardial effusion. Mitral Valve: The mitral valve is normal in structure. No evidence of mitral valve regurgitation. No evidence of mitral valve stenosis. Tricuspid Valve: The tricuspid valve is normal in structure. Tricuspid valve regurgitation is trivial. No evidence of tricuspid stenosis. Aortic Valve: The aortic valve is tricuspid. Aortic valve regurgitation  is not visualized. Aortic valve sclerosis is present, with no evidence of aortic valve stenosis. Aortic valve mean gradient measures 6.0 mmHg. Aortic valve peak gradient measures 11.3 mmHg. Aortic valve area, by VTI measures 2.59 cm. Pulmonic Valve: The pulmonic valve was normal in structure. Pulmonic valve regurgitation is not visualized. No evidence of pulmonic stenosis. Aorta: The aortic root and ascending aorta are structurally normal, with no evidence of dilitation. Venous: The inferior vena cava is normal in size with greater than 50% respiratory variability, suggesting right atrial pressure of 3 mmHg. IAS/Shunts: The interatrial septum was not well visualized.  LEFT VENTRICLE PLAX 2D LVIDd:         5.30 cm   Diastology LVIDs:         3.90 cm   LV e' medial:    8.70 cm/s LV PW:         1.10 cm   LV E/e' medial:  12.2 LV IVS:        1.10 cm   LV e' lateral:   12.70 cm/s LVOT diam:     2.10 cm   LV E/e' lateral: 8.3 LV SV:         76 LV SV Index:   33 LVOT Area:     3.46 cm  IVC IVC diam: 1.70 cm  PULMONARY VEINS Diastolic Velocity: 66.00 cm/s S/D Velocity:       1.20 Systolic Velocity:  79.10 cm/s LEFT ATRIUM             Index        RIGHT ATRIUM           Index LA diam:        4.30 cm 1.85 cm/m   RA Area:     18.50 cm LA Vol (A2C):   85.9 ml 37.05 ml/m  RA Volume:   47.20 ml  20.36 ml/m LA Vol (A4C):   72.4 ml 31.23 ml/m LA Biplane Vol: 81.3 ml 35.07 ml/m  AORTIC VALVE AV Area (Vmax):    2.35 cm AV Area (Vmean):   2.24 cm AV Area (VTI):     2.59 cm AV Vmax:           168.00 cm/s AV Vmean:          114.000 cm/s AV VTI:            0.294 m AV Peak Grad:      11.3 mmHg AV Mean Grad:      6.0 mmHg LVOT Vmax:         114.00 cm/s LVOT Vmean:        73.800 cm/s LVOT VTI:          0.220 m LVOT/AV VTI ratio: 0.75  AORTA Ao Root diam: 3.00 cm Ao Asc diam:  3.60 cm MITRAL VALVE                TRICUSPID VALVE MV Area (PHT): 3.42 cm     TR Peak grad:   9.9 mmHg MV Decel Time: 222 msec     TR Vmax:        157.00  cm/s MV E velocity: 106.00 cm/s MV A velocity: 117.00 cm/s  SHUNTS MV E/A ratio:  0.91         Systemic VTI:  0.22 m                             Systemic Diam: 2.10 cm  Stanly Leavens MD Electronically signed by Stanly Leavens MD Signature Date/Time: 11/12/2024/10:20:55 AM    Final    DG Shoulder Left Result Date: 11/11/2024 CLINICAL DATA:  Status post fall. EXAM: DG SHOULDER 2+V*L* COMPARISON:  None Available. FINDINGS: There is an acute, mildly displaced fracture deformity involving the dorsal aspect of the distal left clavicle, at the acromioclavicular joint. There is no evidence of dislocation. There is no evidence of arthropathy or other focal bone abnormality. Soft tissues are unremarkable. IMPRESSION: Acute fracture of the distal left clavicle. Electronically Signed   By: Suzen Dials M.D.   On: 11/11/2024 18:14   CT Angio Head W or Wo Contrast Result Date: 11/11/2024 EXAM: CTA Head without and with Intravenous Contrast CLINICAL HISTORY: Stroke, follow up. TECHNIQUE: Axial CTA images of the head without and with intravenous contrast. MIP reconstructed images were created and reviewed. Dose reduction technique was used including one or more of the following: automated exposure control, adjustment of mA and kV according to patient size, and/or iterative reconstruction. CONTRAST: Without and with; 75 mL (iohexol  (OMNIPAQUE ) 350 MG/ML injection 75 mL IOHEXOL  350 MG/ML SOLN). COMPARISON: MRA head 09/18/2015. FINDINGS: ANTERIOR CIRCULATION: The intracranial internal carotid arteries are patent with mild atherosclerotic calcification bilaterally not resulting in a significant stenosis. ACAs and MCAs are patent without evidence of a proximal branch occlusion or significant proximal stenosis. There is mild asymmetric attenuation of left MCA branch vessels in the setting of a chronic infarct. The right A1 segment is hypoplastic. POSTERIOR CIRCULATION: The intracranial vertebral arteries are patent  with the right being strongly dominant and the left being diffusely hypoplastic. A small amount of calcified plaque in the right V4 segment does not result in significant stenosis. Patent PICA and SCA origins are visualized bilaterally. The basilar artery is widely patent. There are moderate sized right and small left posterior communicating arteries. Both PCAs are patent without evidence of a significant proximal stenosis. No aneurysm. The dural venous sinuses are patent. IMPRESSION: 1. Mild intracranial atherosclerosis without a large vessel occlusion or significant proximal stenosis. Electronically signed by: Dasie Hamburg MD 11/11/2024 05:57 PM EST RP Workstation: [id number]   CT HEAD WO CONTRAST Result Date: 11/11/2024 EXAM: CT HEAD WITHOUT 11/11/2024 03:07:54 PM TECHNIQUE: CT of the head was performed without the administration of intravenous contrast. Automated exposure control, iterative reconstruction, and/or weight based adjustment of the mA/kV was utilized to reduce the radiation dose to as low as reasonably achievable. COMPARISON: CT head 07/11/2023. CLINICAL HISTORY: Neuro deficit, acute, stroke suspected FINDINGS: BRAIN AND VENTRICLES: No acute intracranial hemorrhage. No mass effect or midline shift. No extra-axial fluid collection. No evidence of acute infarct. Remote left posterior MCA territory infarct. No hydrocephalus. ORBITS: No acute abnormality. SINUSES AND MASTOIDS: No acute abnormality. SOFT TISSUES AND SKULL: No acute skull fracture. No acute soft tissue abnormality. IMPRESSION: 1. No acute intracranial abnormality. 2. Remote left posterior MCA territory infarct. Electronically signed by: Gilmore Molt 11/11/2024 03:40 PM EST RP Workstation: [id number]     Subjective: - no chest pain, shortness of breath, no abdominal pain, nausea or vomiting.   Discharge Exam: BP 113/82 (BP Location: Left Arm)   Pulse 77   Temp 98.8 F (37.1 C)   Resp (!) 21   Ht 5' 11 (1.803 m)   Wt 122  kg   SpO2 91%   BMI 37.51 kg/m   General: Pt is alert, awake, not in acute distress Cardiovascular: RRR, S1/S2 +, no rubs, no gallops Respiratory: CTA bilaterally, no wheezing,  no rhonchi Abdominal: Soft, NT, ND, bowel sounds + Extremities: no edema, no cyanosis    The results of significant diagnostics from this hospitalization (including imaging, microbiology, ancillary and laboratory) are listed below for reference.     Microbiology: Recent Results (from the past 240 hours)  Resp panel by RT-PCR (RSV, Flu A&B, Covid)     Status: Abnormal   Collection Time: 11/28/24  9:25 AM   Specimen: Nasal Swab  Result Value Ref Range Status   SARS Coronavirus 2 by RT PCR NEGATIVE NEGATIVE Final    Comment: (NOTE) SARS-CoV-2 target nucleic acids are NOT DETECTED.  The SARS-CoV-2 RNA is generally detectable in upper respiratory specimens during the acute phase of infection. The lowest concentration of SARS-CoV-2 viral copies this assay can detect is 138 copies/mL. A negative result does not preclude SARS-Cov-2 infection and should not be used as the sole basis for treatment or other patient management decisions. A negative result may occur with  improper specimen collection/handling, submission of specimen other than nasopharyngeal swab, presence of viral mutation(s) within the areas targeted by this assay, and inadequate number of viral copies(<138 copies/mL). A negative result must be combined with clinical observations, patient history, and epidemiological information. The expected result is Negative.  Fact Sheet for Patients:  bloggercourse.com  Fact Sheet for Healthcare Providers:  seriousbroker.it  This test is no t yet approved or cleared by the [place]  FDA and  has been authorized for detection and/or diagnosis of SARS-CoV-2 by FDA under an Emergency Use Authorization (EUA). This EUA will remain  in effect (meaning this  test can be used) for the duration of the COVID-19 declaration under Section 564(b)(1) of the Act, 21 U.S.C.section 360bbb-3(b)(1), unless the authorization is terminated  or revoked sooner.       Influenza A by PCR POSITIVE (A) NEGATIVE Final   Influenza B by PCR NEGATIVE NEGATIVE Final    Comment: (NOTE) The Xpert Xpress SARS-CoV-2/FLU/RSV plus assay is intended as an aid in the diagnosis of influenza from Nasopharyngeal swab specimens and should not be used as a sole basis for treatment. Nasal washings and aspirates are unacceptable for Xpert Xpress SARS-CoV-2/FLU/RSV testing.  Fact Sheet for Patients: bloggercourse.com  Fact Sheet for Healthcare Providers: seriousbroker.it  This test is not yet approved or cleared by the [place]  FDA and has been authorized for detection and/or diagnosis of SARS-CoV-2 by FDA under an Emergency Use Authorization (EUA). This EUA will remain in effect (meaning this test can be used) for the duration of the COVID-19 declaration under Section 564(b)(1) of the Act, 21 U.S.C. section 360bbb-3(b)(1), unless the authorization is terminated or revoked.     Resp Syncytial Virus by PCR NEGATIVE NEGATIVE Final    Comment: (NOTE) Fact Sheet for Patients: bloggercourse.com  Fact Sheet for Healthcare Providers: seriousbroker.it  This test is not yet approved or cleared by the [place]  FDA and has been authorized for detection and/or diagnosis of SARS-CoV-2 by FDA under an Emergency Use Authorization (EUA). This EUA will remain in effect (meaning this test can be used) for the duration of the COVID-19 declaration under Section 564(b)(1) of the Act, 21 U.S.C. section 360bbb-3(b)(1), unless the authorization is terminated or revoked.  Performed at [facility], [address] [place], [place] [address]   Blood Culture  (routine x 2)     Status: None   Collection Time: 11/28/24  9:31 AM   Specimen: BLOOD  Result Value Ref Range Status   Specimen Description  Final    BLOOD RIGHT ANTECUBITAL Performed at [facility], [address], [place], [place] [address]    Special Requests   Final    BOTTLES DRAWN AEROBIC AND ANAEROBIC Blood Culture adequate volume Performed at [facility], [address] [place], [place] [address]    Culture   Final    NO GROWTH 5 DAYS Performed at [facility], [address] [place], [place] [address]    Report Status 12/03/2024 FINAL  Final  Blood Culture (routine x 2)     Status: Abnormal   Collection Time: 11/28/24  9:33 AM   Specimen: BLOOD  Result Value Ref Range Status   Specimen Description   Final    BLOOD RIGHT ANTECUBITAL Performed at [facility], [address], [place], [place] [address]    Special Requests   Final    BOTTLES DRAWN AEROBIC AND ANAEROBIC Blood Culture adequate volume Performed at [facility], [address] [place], [place] [address]    Culture  Setup Time   Final    GRAM POSITIVE RODS AEROBIC BOTTLE ONLY CRITICAL RESULT CALLED TO, READ BACK BY AND VERIFIED WITH: PHARMD DREW [id number] AT 1450, ADC    Culture (A)  Final    DIPHTHEROIDS(CORYNEBACTERIUM SPECIES) Standardized susceptibility testing for this organism is not available. Performed at [facility], [address], [place], [place] [address]    Report Status 12/02/2024 FINAL  Final  Expectorated Sputum Assessment w Gram Stain, Rflx to Resp Cult     Status: None   Collection Time: 11/28/24 10:30 PM   Specimen: Expectorated Sputum  Result Value Ref Range Status   Specimen Description EXPECTORATED SPUTUM  Final   Special Requests NONE  Final   Sputum evaluation   Final    THIS SPECIMEN IS ACCEPTABLE FOR SPUTUM CULTURE Performed at [facility], [address],  [place], [place] [address]    Report Status 11/29/2024 FINAL  Final  Culture, Respiratory w Gram Stain     Status: None   Collection Time: 11/28/24 10:30 PM  Result Value Ref Range Status   Specimen Description   Final    EXPECTORATED SPUTUM Performed at [facility], [address] [place], [place] [address]    Special Requests   Final    NONE Reflexed from [id number] Performed at [facility], [address], [place], [place] [address]    Gram Stain   Final    FEW SQUAMOUS EPITHELIAL CELLS PRESENT WBC PRESENT, PREDOMINANTLY PMN ABUNDANT GRAM POSITIVE COCCI ABUNDANT GRAM VARIABLE ROD Performed at [facility], [address] [place], [place] [address]    Culture   Final    FEW STAPHYLOCOCCUS AUREUS ABUNDANT CANDIDA ALBICANS    Report Status 12/02/2024 FINAL  Final   Organism ID, Bacteria STAPHYLOCOCCUS AUREUS  Final      Susceptibility   Staphylococcus aureus - MIC*    CIPROFLOXACIN  <=0.5 SENSITIVE Sensitive     ERYTHROMYCIN >=8 RESISTANT Resistant     GENTAMICIN 8 INTERMEDIATE Intermediate     OXACILLIN <=0.25 SENSITIVE Sensitive     TETRACYCLINE <=1 SENSITIVE Sensitive     VANCOMYCIN 1 SENSITIVE Sensitive     TRIMETH /SULFA  <=10 SENSITIVE Sensitive     CLINDAMYCIN  RESISTANT Resistant     RIFAMPIN <=0.5 SENSITIVE Sensitive     Inducible Clindamycin  POSITIVE Resistant     LINEZOLID  2 SENSITIVE Sensitive     *  FEW STAPHYLOCOCCUS AUREUS     Labs: Basic Metabolic Panel: Recent Labs  Lab 11/28/24 0924 11/28/24 1408 11/29/24 0427 12/02/24 0322  NA 136  --  139 138  K 3.4*  --  4.1 4.7  CL 101  --  107 103  CO2 25  --  22 27  GLUCOSE 90  --  149* 150*  BUN 12  --  13 15  CREATININE 0.70  --  0.57* 0.63  CALCIUM  8.3*  --  8.1* 8.5*  MG  --  2.5*  --   --   PHOS  --  2.7  --   --    Liver Function Tests: Recent Labs  Lab 11/29/24 0427  AST 43*  ALT 21  ALKPHOS 71  BILITOT <0.2  PROT 5.9*  ALBUMIN 2.7*   CBC: Recent  Labs  Lab 11/28/24 0924 11/29/24 0427  WBC 7.7 6.6  NEUTROABS  --  5.5  HGB 11.1* 10.2*  HCT 34.5* 32.1*  MCV 92.2 91.5  PLT 250 201   CBG: No results for input(s): GLUCAP in the last 168 hours. Hgb A1c No results for input(s): HGBA1C in the last 72 hours. Lipid Profile No results for input(s): CHOL, HDL, LDLCALC, TRIG, CHOLHDL, LDLDIRECT in the last 72 hours. Thyroid  function studies No results for input(s): TSH, T4TOTAL, T3FREE, THYROIDAB in the last 72 hours.  Invalid input(s): FREET3 Urinalysis    Component Value Date/Time   COLORURINE YELLOW 11/28/2024 2230   APPEARANCEUR CLEAR 11/28/2024 2230   LABSPEC 1.025 11/28/2024 2230   PHURINE 5.0 11/28/2024 2230   GLUCOSEU NEGATIVE 11/28/2024 2230   HGBUR NEGATIVE 11/28/2024 2230   BILIRUBINUR NEGATIVE 11/28/2024 2230   BILIRUBINUR NEG 07/10/2013 1617   KETONESUR 5 (A) 11/28/2024 2230   PROTEINUR 30 (A) 11/28/2024 2230   UROBILINOGEN 0.2 09/18/2015 1301   NITRITE NEGATIVE 11/28/2024 2230   LEUKOCYTESUR NEGATIVE 11/28/2024 2230    FURTHER DISCHARGE INSTRUCTIONS:   Get Medicines reviewed and adjusted: Please take all your medications with you for your next visit with your Primary MD   Laboratory/radiological data: Please request your Primary MD to go over all hospital tests and procedure/radiological results at the follow up, please ask your Primary MD to get all Hospital records sent to his/her office.   In some cases, they will be blood work, cultures and biopsy results pending at the time of your discharge. Please request that your primary care M.D. goes through all the records of your hospital data and follows up on these results.   Also Note the following: If you experience worsening of your admission symptoms, develop shortness of breath, life threatening emergency, suicidal or homicidal thoughts you must seek medical attention immediately by calling 911 or calling your MD immediately  if  symptoms less severe.   You must read complete instructions/literature along with all the possible adverse reactions/side effects for all the Medicines you take and that have been prescribed to you. Take any new Medicines after you have completely understood and accpet all the possible adverse reactions/side effects.    Do not drive when taking Pain medications or sleeping medications (Benzodaizepines)   Do not take more than prescribed Pain, Sleep and Anxiety Medications. It is not advisable to combine anxiety,sleep and pain medications without talking with your primary care practitioner   Special Instructions: If you have smoked or chewed Tobacco  in the last 2 yrs please stop smoking, stop any regular Alcohol  and or any Recreational drug use.  Wear Seat belts while driving.   Please note: You were cared for by a hospitalist during your hospital stay. Once you are discharged, your primary care physician will handle any further medical issues. Please note that NO REFILLS for any discharge medications will be authorized once you are discharged, as it is imperative that you return to your primary care physician (or establish a relationship with a primary care physician if you do not have one) for your post hospital discharge needs so that they can reassess your need for medications and monitor your lab values.  Time coordinating discharge: 35 minutes  SIGNED:  Nilda Fendt, MD, PhD 12/04/2024, 8:21 AM   "

## 2024-12-04 NOTE — Progress Notes (Signed)
 Discharge medications delivered to patient at the bedside in a secure bag.

## 2024-12-04 NOTE — Progress Notes (Signed)
 Patient D/C home- patient arranged a ride via the [employer]. All belongings sent with patient, including home med and O2. Pt stable on 4L/[place] upon D/C. Pt assisted to vehicle via W/C with nursing staff.

## 2024-12-04 NOTE — TOC Transition Note (Signed)
 Transition of Care [facility]) - Discharge Note   Patient Details  Name: [name] [name] MRN: [id number] Date of Birth: [birth year]  Transition of Care [facility]) CM/SW Contact:  Tawni CHRISTELLA Eva, LCSW Phone Number: 12/04/2024, 9:17 AM   Clinical Narrative:    CSW was not able to secure a home health agency, which was explained to the pt yesterday. The patient will need to follow up as an outpatient. Oxygen was delivered on the morning of 1/27. No further ICM needs at this time; ICM to sign off.  Final next level of care: Home/Self Care    Patient Goals and CMS Choice Patient states their goals for this hospitalization and ongoing recovery are:: return home          Discharge Placement                    Patient and family notified of of transfer: 12/03/24  Discharge Plan and Services Additional resources added to the After Visit Summary for                  DME Arranged: Oxygen DME Agency: [employer] Date DME Agency Contacted: 12/03/24 Time DME Agency Contacted: 1021 Representative spoke with at DME Agency: [place]            Social Drivers of Health (SDOH) Interventions SDOH Screenings   Food Insecurity: No Food Insecurity (11/28/2024)  Housing: Low Risk (11/28/2024)  Transportation Needs: No Transportation Needs (11/28/2024)  Recent Concern: Transportation Needs - Unmet Transportation Needs (10/09/2024)  Utilities: Not At Risk (11/28/2024)  Depression (PHQ2-9): High Risk (07/22/2024)  Financial Resource Strain: High Risk (01/10/2024)  Physical Activity: Unknown (01/10/2024)  Social Connections: Socially Isolated (11/11/2024)  Stress: Stress Concern Present (01/10/2024)  Tobacco Use: High Risk (11/28/2024)     Readmission Risk Interventions    11/13/2024   10:46 AM 07/02/2024    3:32 PM  Readmission Risk Prevention Plan  Transportation Screening Complete Complete  PCP or Specialist Appt within 5-7 Days  Complete  Home Care Screening  Complete   Medication Review (RN CM)  Complete  HRI or Home Care Consult Complete   Social Work Consult for Recovery Care Planning/Counseling Complete   Palliative Care Screening Not Applicable   Medication Review Oceanographer) Complete

## 2024-12-05 ENCOUNTER — Inpatient Hospital Stay: Admitting: Family Medicine

## 2024-12-06 ENCOUNTER — Other Ambulatory Visit: Payer: Self-pay
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
## 2024-12-09 ENCOUNTER — Ambulatory Visit

## 2024-12-09 ENCOUNTER — Ambulatory Visit: Payer: Self-pay | Admitting: Cardiology

## 2024-12-09 DIAGNOSIS — R55 Syncope and collapse: Secondary | ICD-10-CM | POA: Diagnosis not present

## 2024-12-11 NOTE — Patient Instructions (Addendum)
 Visit Information  [name] was given information about Medicaid Managed Care team care coordination services as a part of their [facility] Medicaid benefit.   If you would like to schedule transportation through your [facility] plan, please call the following number at least 2 days in advance of your appointment: [phone].   You can also use the MTM portal or MTM mobile app to manage your rides. Reimbursement for transportation is available through [facility]! For the portal, please go to [email or web address]  Call the [facility] Crisis Line at [phone], at any time, 24 hours a [place], 7 days a week. If you are in danger or need immediate medical attention call 911.  Please see education materials related to COPD Action Plan provided by MyChart link.  Patient verbalizes understanding of instructions and care plan provided today and agrees to view in MyChart. Active MyChart status and patient understanding of how to access instructions and care plan via MyChart confirmed with patient.     RN Care Manager will follow up by telephone on Monday, February 16 at 11:30 AM  [name] Ly RN BSN CCM [facility]  [facility], [facility] Nurse Care Coordinator  Direct Dial: [phone] Website: [name]    Following is a copy of your plan of care:   Goals Addressed             This Visit's Progress    VBCI RN Care Plan related to COPD with exacerbation       Problems:  Chronic Disease Management support and education needs related to COPD  Goal: Over the next 90 days the Patient will continue to work with RN Care Manager and/or Social Worker to address care management and care coordination needs related to COPD as evidenced by adherence to care management team scheduled appointments      Interventions:   COPD Interventions: Advised patient to self assesses COPD action plan zone and make appointment with provider if in the yellow zone  for 48 hours without improvement Discussed the importance of adequate rest and management of fatigue with COPD Provided instruction about proper use of medications used for management of COPD including inhalers Provided patient with basic written and verbal COPD education on self care/management/and exacerbation prevention Use of home oxygen  Patient Self-Care Activities:  Attend all scheduled provider appointments Call pharmacy for medication refills 3-7 days in advance of running out of medications Call provider office for new concerns or questions  Take medications as prescribed   develop a rescue plan follow rescue plan if symptoms flare-up keep follow-up appointments: PCP post hospital follow up scheduled for 12/12/24 at 10:00 AM  Recommendation:   PCP follow-up  12/12/2024 Status: Sch   Time: 10:00 AM Length: 30  Visit Type: HOSPITAL FOLLOW UP [8005] Copay: $0.00  Provider: [name], FNP Department: [facility]   Specialty provider follow-up   12/26/2024 Status: Sch    Time: 8:00 AM Length: 25  Visit Type: HTN [2380] Copay: $0.00  Provider: Vannie Reche RAMAN, NP Department: [facility]    Follow Up Plan:   Telephone follow up appointment date/time  12/12/2024 Status: Sch   Time: 2:00 PM Length: 30  Visit Type: PATIENT OUTREACH 30 [3016] Copay: $0.00  Provider: [name] Department: [facility]    12/23/2024 Status: Sch   Time: 11:30 AM Length: 30  Visit Type: VBCI TELEPHONE CALL 30 [2502] Copay: $0.00  Provider: [name], RN Department: [facility]ALTH  VBCI RN Care Plan related to Essential Hypertension   On track    Problems:  Chronic Disease Management support and education needs related to HTN  Goal: Over the next 90 days the Patient will continue to work with RN Care Manager and/or Social Worker to address care management and care coordination needs related to HTN as evidenced by adherence to care  management team scheduled appointments     demonstrate Improved health management independence as evidenced by patient will report BP <130/80         Interventions: Evaluation of current treatment plan related to  self management and patient's adherence to plan as established by provider  Hypertension Interventions: Last practice recorded BP readings:  BP Readings from Last 3 Encounters:  12/04/24 (!) 147/81  11/23/24 (!) 145/82  11/13/24 (!) 157/75   Most recent eGFR/CrCl:  Lab Results  Component Value Date   EGFR 93 07/19/2024    No components found for: CRCL  Reviewed and discussed with patient his recent hospitalization for COPD exacerbation secondary to having type A Influenza Reviewed medications with patient and discussed importance of compliance Reviewed and discussed post discharge instructions to STOP taking Hydralazine  100 mg tablet  Advised patient, providing education and rationale, to monitor blood pressure daily and record, calling PCP for findings outside established parameters Assisted patient with scheduling a post discharge PCP follow up visit  Discussed plans with patient for ongoing nurse care management follow up and provided patient with direct contact information for nurse case management  Patient Self-Care Activities:  Attend all scheduled provider appointments Call pharmacy for medication refills 3-7 days in advance of running out of medications Call provider office for new concerns or questions  Take medications as prescribed   Work with the social worker to address care coordination needs and will continue to work with the clinical team to address health care and disease management related needs check blood pressure daily learn about high blood pressure keep a blood pressure log take blood pressure log to all doctor appointments call doctor for signs and symptoms of high blood pressure keep all doctor appointments take medications for blood pressure  exactly as prescribed report new symptoms to your doctor  Recommendation:   PCP follow-up  12/12/2024 Status: Sch   Time: 10:00 AM Length: 30  Visit Type: HOSPITAL FOLLOW UP [8005] Copay: $0.00  Provider: [name], FNP Department: [facility]   Specialty provider follow-up   12/26/2024 Status: Sch    Time: 8:00 AM Length: 25  Visit Type: HTN [2380] Copay: $0.00  Provider: Vannie Reche RAMAN, NP Department: [facility]    Follow Up Plan:   Telephone follow up appointment date/time  12/12/2024 Status: Sch   Time: 2:00 PM Length: 30  Visit Type: PATIENT OUTREACH 30 [3016] Copay: $0.00  Provider: [name] Department: [facility]    12/23/2024 Status: Sch   Time: 11:30 AM Length: 30  Visit Type: VBCI TELEPHONE CALL 30 [2502] Copay: $0.00  Provider: Morgan [name] CROME, RN Department: [facility] HEALTH

## 2024-12-12 ENCOUNTER — Inpatient Hospital Stay: Admitting: Family Medicine

## 2024-12-12 ENCOUNTER — Telehealth: Admitting: Family Medicine

## 2024-12-12 ENCOUNTER — Other Ambulatory Visit: Payer: Self-pay | Admitting: Licensed Clinical Social Worker

## 2024-12-12 ENCOUNTER — Other Ambulatory Visit (HOSPITAL_COMMUNITY): Payer: Self-pay

## 2024-12-12 ENCOUNTER — Encounter: Payer: Self-pay | Admitting: Family Medicine

## 2024-12-12 VITALS — BP 172/86 | HR 76

## 2024-12-12 DIAGNOSIS — I10 Essential (primary) hypertension: Secondary | ICD-10-CM

## 2024-12-12 DIAGNOSIS — J101 Influenza due to other identified influenza virus with other respiratory manifestations: Secondary | ICD-10-CM

## 2024-12-12 DIAGNOSIS — Z9181 History of falling: Secondary | ICD-10-CM

## 2024-12-12 DIAGNOSIS — J449 Chronic obstructive pulmonary disease, unspecified: Secondary | ICD-10-CM

## 2024-12-12 MED ORDER — LIDOCAINE 5 % EX PTCH
1.0000 | MEDICATED_PATCH | CUTANEOUS | 0 refills | Status: AC
  Filled 2024-12-12: qty 30, 30d supply, fill #0

## 2024-12-12 NOTE — Patient Outreach (Signed)
 Social Drivers of Health  Community Resource and Care Coordination Visit Note   12/12/2024  Name: [name] [name] MRN: [id number] DOB:04-Nov-1961  Situation: Referral received for [facility] needs assessment and assistance related to Transportation medicaid. I obtained verbal consent from Patient.  Visit completed with Patient on the phone.   Background:   SDOH Interventions Today    Flowsheet Row Most Recent Value  SDOH Interventions   Food Insecurity Interventions Intervention Not Indicated  Housing Interventions Intervention Not Indicated  Transportation Interventions Intervention Not Indicated  Utilities Interventions Intervention Not Indicated     Assessment:   Goals Addressed             This Visit's Progress    COMPLETED: BSW VBCI Social Work Care Plan       Current SDOH Barriers:  Transportation  Interventions: Patient interviewed and appropriate screenings performed Patient stated that he is currently working with Medicaid for assistance with transportation and he wants to give them a opportunity to assist.          Recommendation:   attend all scheduled provider appointments Patient stated that he is deciding to change his PCP so that he will not have to worry about transportation, he stated that he was waiting for his oxygen to come today. SW will close out the case today and stated to patient that if SDOH needs arise to let the RNCM no to put him back on the SW schedule  Follow Up Plan:   Patient has achieved all patient stated goals. [employer] will be closed. Patient has been provided contact information should new needs arise.   Tobias CHARM [name], PhD [facility], [facility] Social Worker Direct Dial: [phone]  Fax: [phone]

## 2024-12-12 NOTE — Progress Notes (Signed)
 "                    MyChart Video Visit    Virtual Visit via Video Note   This format is felt to be most appropriate for this patient at this time. Physical exam was limited by quality of the video and audio technology used for the visit.    Patient location: home Provider location: [place] [facility] Persons involved in the visit: patient, provider  I discussed the limitations of evaluation and management by telemedicine and the availability of in person appointments. The patient expressed understanding and agreed to proceed.  Patient: [name] [name]   DOB: Aug 10, 1961   64 y.o. Male  MRN: [id number] Visit Date: 12/12/2024  Today's healthcare provider: Jeoffrey GORMAN Barrio, FNP   No chief complaint on file.   Subjective:    HPI  Mr Henneman is connecting today for hospital follow up. Unable to visit in person due to transportation issues. He was hospitalized from 11/28/2024-12/04/2024 for acute respiratory failure due to COPD exacerbation due to influenza A. Treated with Tamiflu , steroids, antibiotics. Discharged home with home O2. SNF was recommended however he declined. See HPI below.  Medication changes include DC hydralazine . Discharged on Doxycycline  and Prednisone  taper.  Mr Gillum also suffered a left third rib fracture that occurred when he was at rest checking his blood pressure at home. Has had frequent falls and syncopal episodes recently. Is being worked up by cardiology for this. Completed recent long term cardiac monitor that showed no arrhythmia. See cardiology AP below.  Discharge summary 12/04/2024 for reference only: Hospital Course / Discharge diagnoses: Principal problem Acute respiratory failure with hypoxia due to COPD exacerbation due to Influenza A - patient admitted to the hospital with wheezing, shortness of breath, tested positive for influenza A. He was started on tamiflu  and empiric antibiotics with improvement in his respiratory status. He  was also placed on steroids. His wheezing resolved, breathing is better, and will be discharged home in stable condition. Of note, PT recommends SNF however patient declines, and HH will be maximized as able. Given underlying COPD and ongoing tobacco use, he will also require home O2 on discharge.    Active Problems: Hypokalemia - Repleted and resolved Essential hypertension - Blood pressure stable, continuing home Lasix , irbesartan . Hold hydralazine  since his pressure is normal  Obesity, Class II, BMI 35-39.9 - Current BMI 37.51 kg/m. Anxiety and depression - Continue home regimen OSA on CPAP - CPAP at bedtime. (HFpEF) heart failure with preserved ejection fraction - Will  resume furosemide  40 mg daily Low back pain, Chronic pain syndrome - Continue daily methadone  130 mg p.o. daily.. Tobacco use -History of significant smoking, currently trying to wean but still smoking 1-1/2 pack daily. He doesn't want a nicotine  patch History of CVA - Supportive care. History of DVT, History of pulmonary embolism  - Continue rivaroxaban  20 mg p.o. daily. Tinea cruris - s/p nystatin  topical powder 3 times a [place] for 3 days   Sepsis ruled out  Cardiology A&P 10/18/2025 for reference only:   Frequent falls -was previously advised to proceed with ED evaluation but he did not have a ride and did not feel symptoms warranted call to 911  If recurs encouraged to present to ED for evaluation.  CMP to rule out renal or liver dysfunction. CBC to rule out anemia. Untreated sleep apnea likely contributory.    HTN -BP not at goal less than 130/80.  Just  resumed valsartan  160 mg yesterday.  Encouraged if he has difficulty getting meds in the pharmacy in the future to get through [facility] pharmacy for delivery.  Continue valsartan  160 mg daily.  Add hydralazine  50 mg twice daily.  BMP today. Discussed to monitor BP at home at least 2 hours after medications and sitting for 5-10 minutes.  Secondary hypertension workup Renal  artery duplex previously ordered but not performed.  Will schedule at follow-up. Management of sleep apnea, as below.  Pending CPAP titration. Plan for renin-aldosterone ratio at follow up    ?Heart failure -reports previous diagnosis.  Echo during admission 06/2024 unable to determine LVEF, poor image quality. No HF symptoms.  Euvolemic on exam.  No further workup at this time due to need to optimize BP control.   Hx of CVA / HLD, LDL goal <70 - Not presently on statin. Address at follow up. Per chart notes previously declined. If intolerant, consider PCSK9i   Hx of PE - On long term OAC managed by PCP.    PVD/Lymphedema/Varicose veins -following with wound clinic.  Referred to [facility] for further management of lymphedema .  Encouraged to continue using lymphedema pumps twice per [place].   OSA-awaiting CPAP titration, I provided him the phone number to call to schedule.  Anticipate his bouts of falling asleep suddenly may be related to untreated OSA.  Discussed the use of AI scribe software for clinical note transcription with the patient, who gave verbal consent to proceed.  History of Present Illness [name] [name] is a 64 year old male with COPD who presents with concerns about oxygen management and recent hospitalizations.  He has been hospitalized three times recently, most recently for the flu, and completed a course of Tamiflu , antibiotics, and a prednisone  taper. He is currently on 4 liters of oxygen per hour continuously, both [place] and night, and monitors his oxygen levels at home. His oxygen saturation typically ranges from 89 to 92%, but can drop as low as 85%. He has not yet had a follow-up appointment set up with a pulmonologist, but he is scheduled to see cardiology later this month.  He has a history of COPD and bronchitis. He recently had the flu. No passing out episodes have occurred since his last hospital discharge. He is not currently on any inhalers except for albuterol  inhaler,  which he uses as needed every six hours.  His blood pressure has not been monitored regularly at home, and he forgets to check it. He is on multiple medications, including valsartan , which he takes in the morning. He has not been taking hydralazine , which was previously prescribed.  He experiences leg pain and swelling, although the swelling has been decreasing. He has not been set up with physical therapy, but his legs are 'really, really, really painful'.  His current medications include albuterol  as needed, aspirin , Centrum Silver, Cymbalta  in the morning, Flonase  at night, Lasix  in the morning, gabapentin  three times a [place], Claritin  in the morning, methadone , sodium fluoride nasal spray, valsartan  in the morning, and Xarelto  in the evening. He is no longer taking Mucinex  or prednisone .   Review of Systems  All other systems reviewed and are negative.   Last CBC Lab Results  Component Value Date   WBC 6.6 11/29/2024   HGB 10.2 (L) 11/29/2024   HCT 32.1 (L) 11/29/2024   MCV 91.5 11/29/2024   MCH 29.1 11/29/2024   RDW 16.1 (H) 11/29/2024   PLT 201 11/29/2024   Last metabolic panel  Lab Results  Component Value Date   GLUCOSE 150 (H) 12/02/2024   NA 138 12/02/2024   K 4.7 12/02/2024   CL 103 12/02/2024   CO2 27 12/02/2024   BUN 15 12/02/2024   CREATININE 0.63 12/02/2024   GFRNONAA >60 12/02/2024   CALCIUM  8.5 (L) 12/02/2024   PHOS 2.7 11/28/2024   PROT 5.9 (L) 11/29/2024   ALBUMIN 2.7 (L) 11/29/2024   BILITOT <0.2 11/29/2024   ALKPHOS 71 11/29/2024   AST 43 (H) 11/29/2024   ALT 21 11/29/2024   ANIONGAP 8 12/02/2024   Last lipids Lab Results  Component Value Date   CHOL 156 03/06/2024   HDL 58 03/06/2024   LDLCALC 83 03/06/2024   LDLDIRECT 75 01/14/2013   TRIG 70 03/06/2024   CHOLHDL 2.7 03/06/2024   Last hemoglobin A1c Lab Results  Component Value Date   HGBA1C 5.5 03/06/2024   Last thyroid  functions Lab Results  Component Value Date   TSH 1.280  10/22/2024   Last vitamin D No results found for: 25OHVITD2, 25OHVITD3, VD25OH Last vitamin B12 and Folate No results found for: VITAMINB12, FOLATE       Objective:    BP (!) 172/86   Pulse 76   SpO2 96%   BP Readings from Last 3 Encounters:  12/12/24 (!) 172/86  12/04/24 (!) 147/81  11/23/24 (!) 145/82   Wt Readings from Last 3 Encounters:  11/28/24 268 lb 15.4 oz (122 kg)  11/13/24 268 lb 4.8 oz (121.7 kg)  10/18/24 276 lb 11.2 oz (125.5 kg)        Physical Exam Constitutional:      Appearance: Normal appearance.  Pulmonary:     Effort: No respiratory distress.     Comments: Wearing oxygen via nasal cannula Musculoskeletal:     Right lower leg: Edema present.     Left lower leg: Edema present.  Neurological:     General: No focal deficit present.     Mental Status: He is alert and oriented to person, place, and time.  Psychiatric:        Mood and Affect: Mood normal.        Behavior: Behavior normal.        Thought Content: Thought content normal.        Judgment: Judgment normal.         Assessment & Plan:    Problem List Items Addressed This Visit       Cardiovascular and Mediastinum   Essential hypertension   Relevant Orders   CBC with Differential/Platelet   Comprehensive metabolic panel with GFR     Respiratory   COPD (chronic obstructive pulmonary disease) (HCC) - Primary (Chronic)   Relevant Orders   Ambulatory referral to Pulmonology   Influenza A   Other Visit Diagnoses       At risk for falls       Relevant Orders   Ambulatory referral to Home Health       Assessment and Plan Assessment & Plan Chronic obstructive pulmonary disease COPD exacerbation likely due to recent influenza infection. Oxygen saturation fluctuates between 85-98%. - Referred to pulmonology for COPD management and oxygen therapy evaluation. - Advised to monitor oxygen saturation closely and seek hospital care if saturation sustains below  88%.  Essential hypertension Blood pressure elevated at 172/86 mmHg. Inconsistent monitoring. Hydralazine  previously discontinued without clear reason. - Resume hydralazine  50mg  daily - Advised to monitor blood pressure more closely, especially in the morning and 30 minutes post-medication. -  Keep appointment with Cardiology on February 19th for blood pressure re-evaluation. - CBC and CMP when able in office. - Follow up with me ASAP in office to evaluate BP and O2 requirements.  Influenza A, recently treated Completed treatment with Tamiflu , antibiotics, and prednisone  taper. No pneumonia diagnosed, but experienced bronchitis symptoms. - Ordered follow-up chest x-ray in 1-2 weeks.  Lower limb pain and swelling, post-cellulitis at risk for falls Swelling decreasing, but significant pain persists. No current physical therapy. - Referred to home health physical therapy for evaluation and management.    Meds ordered this encounter  Medications   lidocaine  (LIDODERM ) 5 %    Sig: Place 1 patch onto the skin daily. Remove & Discard patch within 12 hours or as directed by MD    Dispense:  30 patch    Refill:  0    Supervising Provider:   DUANNE LOWERS T [[id number]]     Return for ASAP HTN, COPD.     I discussed the assessment and treatment plan with the patient. The patient was provided an opportunity to ask questions and all were answered. The patient agreed with the plan and demonstrated an understanding of the instructions.   The patient was advised to call back or seek an in-person evaluation if the symptoms worsen or if the condition fails to improve as anticipated.  I provided 30 minutes of non-face-to-face time during this encounter.  Jeoffrey GORMAN Barrio, FNP [facility]    "

## 2024-12-12 NOTE — Patient Instructions (Signed)
 Visit Information  Thank you for taking time to visit with me today. Please don't hesitate to contact me if I can be of assistance to you before our next scheduled appointment.  Your next care management appointment is no further scheduled appointments.  on   Please call the care guide team at [phone] if you need to cancel, schedule, or reschedule an appointment.   Please call the Suicide and Crisis Lifeline: 988 call 1-800-273-TALK (toll free, 24 hour hotline) call the [facility]: [phone] call 911 if you are experiencing a Mental Health or Behavioral Health Crisis or need someone to talk to.  Tobias CHARM [name], PhD [facility], [facility] Social Worker Direct Dial: [phone]  Fax: [phone]

## 2024-12-13 ENCOUNTER — Other Ambulatory Visit: Payer: Self-pay | Admitting: Family Medicine

## 2024-12-13 DIAGNOSIS — J302 Other seasonal allergic rhinitis: Secondary | ICD-10-CM

## 2024-12-13 DIAGNOSIS — I5032 Chronic diastolic (congestive) heart failure: Secondary | ICD-10-CM

## 2024-12-13 NOTE — Telephone Encounter (Signed)
 Copied from CRM #[id number]. Topic: Clinical - Medication Refill >> Dec 13, 2024  1:21 PM Gattis SQUIBB wrote: Medication: Albuterol  inhaler  Has the patient contacted their pharmacy? No (Agent: If no, request that the patient contact the pharmacy for the refill. If patient does not wish to contact the pharmacy document the reason why and proceed with request.) (Agent: If yes, when and what did the pharmacy advise?)  [facility]   [address]  Is this the correct pharmacy for this prescription? Yes If no, delete pharmacy and type the correct one.   Has the prescription been filled recently? Yes  Is the patient out of the medication? Yes  Has the patient been seen for an appointment in the last year OR does the patient have an upcoming appointment? Yes  Can we respond through MyChart? No  Agent: Please be advised that Rx refills may take up to 3 business days. We ask that you follow-up with your pharmacy.

## 2024-12-20 ENCOUNTER — Encounter (HOSPITAL_BASED_OUTPATIENT_CLINIC_OR_DEPARTMENT_OTHER): Admitting: General Surgery

## 2024-12-23 ENCOUNTER — Telehealth

## 2024-12-26 ENCOUNTER — Encounter (HOSPITAL_BASED_OUTPATIENT_CLINIC_OR_DEPARTMENT_OTHER): Admitting: Family

## 2025-01-20 ENCOUNTER — Ambulatory Visit (HOSPITAL_COMMUNITY)

## 2025-01-20 ENCOUNTER — Encounter
